# Patient Record
Sex: Male | Born: 1950 | Race: White | Hispanic: No | Smoking: Former smoker
Health system: Southern US, Community
[De-identification: ages and names within clinical notes are randomized; demographics above are authoritative.]

## PROBLEM LIST (undated history)

## (undated) DIAGNOSIS — I82409 Acute embolism and thrombosis of unspecified deep veins of unspecified lower extremity: Secondary | ICD-10-CM

## (undated) DIAGNOSIS — E119 Type 2 diabetes mellitus without complications: Secondary | ICD-10-CM

## (undated) DIAGNOSIS — N200 Calculus of kidney: Secondary | ICD-10-CM

## (undated) DIAGNOSIS — Z87442 Personal history of urinary calculi: Secondary | ICD-10-CM

## (undated) DIAGNOSIS — I10 Essential (primary) hypertension: Secondary | ICD-10-CM

## (undated) DIAGNOSIS — T8859XA Other complications of anesthesia, initial encounter: Secondary | ICD-10-CM

## (undated) DIAGNOSIS — I4891 Unspecified atrial fibrillation: Secondary | ICD-10-CM

## (undated) DIAGNOSIS — G4730 Sleep apnea, unspecified: Secondary | ICD-10-CM

## (undated) DIAGNOSIS — F32A Depression, unspecified: Secondary | ICD-10-CM

## (undated) HISTORY — PX: STOMACH SURGERY: SHX791

---

## 1998-07-29 HISTORY — PX: TONSILLECTOMY: SUR1361

## 2006-03-26 ENCOUNTER — Ambulatory Visit (HOSPITAL_COMMUNITY): Admission: RE | Admit: 2006-03-26 | Discharge: 2006-03-26 | Payer: Self-pay | Admitting: Urology

## 2006-03-27 ENCOUNTER — Ambulatory Visit (HOSPITAL_COMMUNITY): Admission: RE | Admit: 2006-03-27 | Discharge: 2006-03-27 | Payer: Self-pay | Admitting: Urology

## 2006-04-28 ENCOUNTER — Ambulatory Visit (HOSPITAL_COMMUNITY): Admission: RE | Admit: 2006-04-28 | Discharge: 2006-04-28 | Payer: Self-pay | Admitting: Urology

## 2007-06-22 ENCOUNTER — Ambulatory Visit (HOSPITAL_COMMUNITY): Admission: RE | Admit: 2007-06-22 | Discharge: 2007-06-22 | Payer: Self-pay | Admitting: Neurosurgery

## 2007-09-22 ENCOUNTER — Ambulatory Visit: Payer: Self-pay | Admitting: Gastroenterology

## 2007-09-23 ENCOUNTER — Ambulatory Visit (HOSPITAL_COMMUNITY): Admission: RE | Admit: 2007-09-23 | Discharge: 2007-09-23 | Payer: Self-pay | Admitting: Gastroenterology

## 2007-09-23 ENCOUNTER — Encounter: Payer: Self-pay | Admitting: Gastroenterology

## 2007-09-23 ENCOUNTER — Ambulatory Visit: Payer: Self-pay | Admitting: Gastroenterology

## 2007-12-29 ENCOUNTER — Ambulatory Visit: Payer: Self-pay | Admitting: Gastroenterology

## 2008-02-26 DIAGNOSIS — I10 Essential (primary) hypertension: Secondary | ICD-10-CM | POA: Insufficient documentation

## 2008-02-26 DIAGNOSIS — K648 Other hemorrhoids: Secondary | ICD-10-CM | POA: Insufficient documentation

## 2008-02-26 DIAGNOSIS — E785 Hyperlipidemia, unspecified: Secondary | ICD-10-CM | POA: Insufficient documentation

## 2008-02-26 DIAGNOSIS — G4730 Sleep apnea, unspecified: Secondary | ICD-10-CM | POA: Insufficient documentation

## 2008-02-26 DIAGNOSIS — K2980 Duodenitis without bleeding: Secondary | ICD-10-CM | POA: Insufficient documentation

## 2008-02-26 DIAGNOSIS — K2970 Gastritis, unspecified, without bleeding: Secondary | ICD-10-CM | POA: Insufficient documentation

## 2008-02-26 DIAGNOSIS — K2990 Gastroduodenitis, unspecified, without bleeding: Secondary | ICD-10-CM | POA: Insufficient documentation

## 2008-02-26 DIAGNOSIS — R109 Unspecified abdominal pain: Secondary | ICD-10-CM | POA: Insufficient documentation

## 2008-02-26 DIAGNOSIS — G4700 Insomnia, unspecified: Secondary | ICD-10-CM

## 2008-02-26 DIAGNOSIS — M542 Cervicalgia: Secondary | ICD-10-CM

## 2008-02-26 DIAGNOSIS — K644 Residual hemorrhoidal skin tags: Secondary | ICD-10-CM | POA: Insufficient documentation

## 2008-02-26 DIAGNOSIS — K269 Duodenal ulcer, unspecified as acute or chronic, without hemorrhage or perforation: Secondary | ICD-10-CM | POA: Insufficient documentation

## 2008-02-26 DIAGNOSIS — K921 Melena: Secondary | ICD-10-CM

## 2008-02-26 DIAGNOSIS — E119 Type 2 diabetes mellitus without complications: Secondary | ICD-10-CM | POA: Insufficient documentation

## 2008-02-26 DIAGNOSIS — Z8719 Personal history of other diseases of the digestive system: Secondary | ICD-10-CM

## 2008-11-17 ENCOUNTER — Ambulatory Visit (HOSPITAL_COMMUNITY): Admission: RE | Admit: 2008-11-17 | Discharge: 2008-11-17 | Payer: Self-pay | Admitting: Urology

## 2010-05-09 ENCOUNTER — Emergency Department (HOSPITAL_COMMUNITY): Admission: EM | Admit: 2010-05-09 | Discharge: 2010-05-09 | Payer: Self-pay | Admitting: Emergency Medicine

## 2010-10-11 LAB — COMPREHENSIVE METABOLIC PANEL
ALT: 16 U/L (ref 0–53)
AST: 20 U/L (ref 0–37)
Alkaline Phosphatase: 64 U/L (ref 39–117)
CO2: 31 mEq/L (ref 19–32)
Calcium: 9.1 mg/dL (ref 8.4–10.5)
GFR calc Af Amer: >60 mL/min (ref >60)
GFR calc non Af Amer: >60 mL/min (ref >60)
Glucose, Bld: 148 mg/dL — ABNORMAL HIGH (ref 70–99)
Potassium: 4.4 mEq/L (ref 3.5–5.1)
Sodium: 139 mEq/L (ref 135–145)

## 2010-10-11 LAB — DIFFERENTIAL
Basophils Relative: 0 % (ref 0–1)
Eosinophils Absolute: 0.1 10*3/uL (ref 0.0–0.7)
Eosinophils Relative: 1 % (ref 0–5)
Lymphs Abs: 2.2 10*3/uL (ref 0.7–4.0)
Monocytes Relative: 8 % (ref 3–12)
Neutrophils Relative %: 60 % (ref 43–77)

## 2010-10-11 LAB — URINALYSIS, ROUTINE W REFLEX MICROSCOPIC
Bilirubin Urine: NEGATIVE
Glucose, UA: NEGATIVE mg/dL
Ketones, ur: NEGATIVE mg/dL
Protein, ur: NEGATIVE mg/dL
pH: 7 (ref 5.0–8.0)

## 2010-10-11 LAB — URINE CULTURE
Colony Count: NO GROWTH
Culture  Setup Time: 201110130155
Culture: NO GROWTH

## 2010-10-11 LAB — CBC
HCT: 43.5 % (ref 39.0–52.0)
Hemoglobin: 14.8 g/dL (ref 13.0–17.0)
RBC: 4.89 MIL/uL (ref 4.22–5.81)
WBC: 7.1 10*3/uL (ref 4.0–10.5)

## 2010-12-11 NOTE — Assessment & Plan Note (Signed)
NAME:  Jim Ramirez, Jim Ramirez               CHART#:  [id number]   DATE:  12/29/2007                       DOB:  04-16-1951   REFERRING PHYSICIAN:  [name], M.D.   PROBLEM LIST:  1. Gastritis, duodenitis, and duodenal ulcer secondary to aspirin and      Mobic without gastrointestinal prophylaxis.  2. Neck pain.  3. Diabetes.  4. Insomnia.  5. Hyperlipidemia.  6. Sleep apnea.  7. Hypertension.  8. Colonoscopy in 2008 for rectal bleeding, which showed internal and      external hemorrhoids.   SUBJECTIVE:  Jim Ramirez is a 60 year old male who presents as a return  patient visit.  He was off his Mobic and his aspirin for three weeks.  He restarted it and approximately 1-1/2 months after that, had two  episodes of cramping.  He continues only omeprazole daily.  His appetite  is good.  He denies any problems swallowing, diarrhea, or constipation.   ALLERGIES:  No known drug allergies.   MEDICATIONS:  1. Insulin 70/30 40 units daily.  2. Simvastatin.  3. Trandolapril.  4. HCTZ.  5. Metoprolol.  6. Metformin 500 mg t.i.d.  7. Ambien CR 12.5 mg nightly.  8. Meloxicam 15 mg daily.  9. Aspirin 325 mg daily.  10.B12 1000 mcg daily.  11.Omeprazole 20 mg daily.   OBJECTIVE:  Weight 238 pounds.  Height 6 feet.  Temperature 97.8, blood  pressure 130/88, pulse 84.GENERAL:  He is in no apparent distress.  Alert and oriented x4.  LUNGS:  Clear to auscultation bilaterally.  CARDIOVASCULAR:  Regular rhythm.  No murmur.  ABDOMEN:  Bowel sounds are present.  Soft, nontender, nondistended.   ASSESSMENT:  Jim Ramirez is a 60 year old male who presents with  gastritis and duodenal ulcers as well as duodenitis while being on  aspirin and Mobic.  His symptoms have not been ideally controlled.  Thank you for allowing me to see Jim Ramirez in consultation.  My  recommendations follow.   RECOMMENDATIONS:  1. Will change to Nexium 40 mg daily in an attempt to provide more      adequate control of his  symptoms.  He should be on a PPI      indefinitely while taking aspirin and other anti-inflammatory      drugs.  2. Screening colonoscopy in 2018.  3. He may follow up with me as needed.  If he is having periodic      abdominal pain less than once a month, then he is instructed to      just continue the PPI.  If he is having frequent bouts of abdominal      pain, then he should call me, and we can discuss the need to stop      the anti-inflammatory drugs and choose an anti-inflammatory drug in      a different category.       Kassie Mends, M.D.  Electronically Signed     SM/MEDQ  D:  12/29/2007  T:  12/29/2007  Job:  [id number]   cc:   [name], MD

## 2010-12-11 NOTE — H&P (Signed)
NAME:  Jim Ramirez, Jim Ramirez NO.:  [id number]   MEDICAL RECORD NO.:  [id number]          PATIENT TYPE:  AMB   LOCATION:  DAY                           FACILITY:  [facility]   PHYSICIAN:  Kassie Mends, M.D.      DATE OF BIRTH:  07-Jul-1951   DATE OF ADMISSION:  DATE OF DISCHARGE:  LH                              HISTORY & PHYSICAL   REASON FOR CONSULTATION:  Abdominal cramping and black stools.   HISTORY OF PRESENT ILLNESS:  Jim Ramirez is a 60 year old gentleman who  presents with a 3-4 week history of abdominal cramps and black stools.  He states he has had about 4 episodes of severe abdominal cramping which  has doubled him over.  Not precipitated by any particular event.  He has  noted the last few weeks that his stools have been more hard.  He  usually has loose stools.  He notes all his change in bowel movements  began about 3 months ago when he started taking Mobic.  This was given  to him after he had a neck surgery.  He noted that his stools became  very dark.  More recently they have been black.  He saw black stool last  week.  Denies any red blood.  Denies any heartburn, dysphagia,  odynophagia, or weight loss.  He had vomiting once about 3 weeks ago.  Denies any hematemesis.   CURRENT MEDICATIONS:  1. Insulin 70/30, 40 units daily.  2. Simvastatin 20 mg daily.  3. Trandolapril 2 daily.  4. Hydrochlorothiazide 25 mg daily.  5. Metoprolol 50 mg daily.  6. Metformin 500 mg t.i.d.  7. Ambien CR 12.5 mg daily.  8. Mobic 15 mg daily.  9. Aspirin 325 mg daily.  10.B12 1000 mcg daily.   ALLERGIES:  NO KNOWN DRUG ALLERGIES.   PAST MEDICAL HISTORY:  1. Hypercholesterolemia.  2. Diabetes mellitus.  3. Hypertension.  4. Sleep apnea.   Colonoscopy, July 14, 2007 for rectal bleeding by Dr. Erskine Speed  showed internal and external hemorrhoids, otherwise negative.  He also  has had nephrolithiasis status post lithotripsy at least 10 times.  He  had antireflux  surgery in 1997 by Dr. Erskine Speed.  Surgery on his foot  for bone spurs.  He has had neck surgery twice and sinus surgery twice.   FAMILY HISTORY:  Negative for colorectal cancer, chronic GI illnesses,  peptic ulcer disease.  He tolerated heart disease, diabetes,  hypertension.  Mother deceased age 77 of brain damage from a fall.   SOCIAL HISTORY:  He is single.  He has 2 children.  He is unemployed.  He quit smoking 15 years ago.  No alcohol use.   REVIEW OF SYSTEMS:  See HPI for GI.  CONSTITUTIONAL:  No weight loss.  CARDIOPULMONARY:  No chest pain or shortness of breath.  GENITOURINARY:  No dysuria or hematuria.   PHYSICAL EXAMINATION:  VITAL SIGNS:  Weight 240.  Height 6 feet.  Temp  97.8.  Blood pressure 130/80.  Pulse 80.  GENERAL:  A pleasant well-developed, well-nourished Caucasian male in no  acute distress.  SKIN:  Warm and dry, no jaundice.  HEENT:  Sclerae nonicteric.  Oropharyngeal mucosa moist and pink.  No  lesions, erythema, or exudate.  No lymphadenopathy, thyromegaly.  CHEST:  Lungs are clear to auscultation.  CARDIAC:  Exam reveals regular rate and rhythm.  Normal S1 and S2.  No  murmurs, rubs, or gallops.  ABDOMEN:  Positive bowel sounds.  Abdomen soft.  He has mild epigastric  tenderness to deep palpation.  No rebound, no guarding, no abdominal  bruits or hernias.  No hepatosplenomegaly or masses.  LOWER EXTREMITIES:  No edema.   IMPRESSION:  Jim Ramirez is a 60 year old gentleman with complaints of  abdominal cramping, epigastric pain and melena.  This was in a setting  of aspirin and Mobic use.  Differential diagnosis includes the  possibility of peptic ulcer disease related to nonsteroidal anti-  inflammatory drug use.   PLAN:  Esophagogastroduodenoscopy with Dr. Cira Servant in the near future.  I  believe he is scheduled for tomorrow.  Will hold his Mobic for now.  Further recommendations to follow.      Tana Coast, P.A.      Kassie Mends, M.D.   Electronically Signed    LL/MEDQ  D:  09/22/2007  T:  09/22/2007  Job:  [id number]   cc:   [name], MD  Fax: [phone]

## 2010-12-11 NOTE — Op Note (Signed)
NAME:  Jim Ramirez, Jim Ramirez NO.:  [id number]   MEDICAL RECORD NO.:  [id number]          PATIENT TYPE:  AMB   LOCATION:  DAY                           FACILITY:  [facility]   PHYSICIAN:  Kassie Mends, M.D.      DATE OF BIRTH:  1951/06/20   DATE OF PROCEDURE:  09/23/2007  DATE OF DISCHARGE:                               OPERATIVE REPORT   REFERRING PHYSICIAN:  [name], MD.   PROCEDURE:  Esophagogastroduodenoscopy with cold forceps biopsy.   ENDOSCOPIST:  Kassie Mends, MD.   INDICATIONS FOR EXAM:  Jim Ramirez is a 60 year old male, who presents  with melena while taking aspirin and Mobic without GI prophylaxis.   FINDINGS:  1. Normal esophagus without evidence of Barrett's, mass, erosion,      ulceration, or stricture.  2. Superficial antral erosions associated with diffuse erythema of the      antrum.  Biopsies obtained via cold forceps to evaluate for      H.pylori gastritis.  3. Multiple duodenal ulcers seen at the junction of D-1 and D-2 with      edema and narrowing, which allowed the diagnostic gastroscope to      pass with mild resistance.  No old blood or fresh blood seen in the      stomach or the duodenum.   DIAGNOSIS:  The most likely source for Jim Ramirez melena are the  duodenal ulcers.   RECOMMENDATIONS:  1. Follow up appointment with Dr. Cira Servant in three months to re-evaluate      his melena.  2. He should stop his aspirin and his Mobic and may restart it on      October 15, 2007.  3. He should begin Omeprazole 20 mg daily.  4. We will call Jim Ramirez with the results of his biopsy.  5. He may resume his previous diet but avoid gastric irritants.  He is      given a handout on gastric irritants, ulcers, and gastritis.   MEDICATIONS:  1. Demerol 100 mg IV.  2. Versed 8 mg IV.   PROCEDURE TECHNIQUE:  Physical exam was performed, informed consent was  obtained from the patient after explaining the benefits, risks, and  alternatives to the procedure.   The patient was connected to the monitor  and placed in the left lateral position.  Continuous oxygen was provided  by nasal cannula, and IV medicine administered through an indwelling  cannula.  After administration of sedation, the patient's esophagus was  intubated and the scope was advanced under direct visualization to the  second portion of the duodenum.  The scope was removed  slowly back after examining the color, texture, anatomy, and integrity  of the mucosa on the way out.  The patient was recovered in Endoscopy  and discharged home in satisfactory condition.   PATH 28413:  REACTIVE GASTROPATHY. Continue omeprazole.      Kassie Mends, M.D.  Electronically Signed     SM/MEDQ  D:  09/23/2007  T:  09/23/2007  Job:  [id number]   cc:   [name], MD  Fax: [phone]

## 2010-12-14 NOTE — H&P (Signed)
NAME:  Jim Ramirez, Jim Ramirez NO.:  [id number]   MEDICAL RECORD NO.:  [id number]           PATIENT TYPE:  AMB   LOCATION:  DAY                           FACILITY:  [facility]   PHYSICIAN:  Dennie Maizes, M.D.   DATE OF BIRTH:  01-Dec-1950   DATE OF ADMISSION:  DATE OF DISCHARGE:  LH                                HISTORY & PHYSICAL   CHIEF COMPLAINT:  Left flank pain, left renal calculus.   HISTORY OF PRESENT ILLNESS:  This 60 year old male has history of recurrent  renal lithiasis in the past.  Has undergone several lithotripsies for kidney  stones.  He has been having severe left flank pain radiating to the front  for several weeks.  The pain became severe and he went to the emergency  room.  He was evaluated with a noncontrast CT scan of abdomen and pelvis.  This revealed a 7 x 7 x 3 mm size left renal calculus.  There was no  evidence of hydronephrosis.  The patient is unable to pass the stone.  He is  brought to the [facility] today for extracorporeal shockwave  lithotripsy of the left renal calculus.  The patient denies having any  fever, chills, voiding difficulty, or gross hematuria at present.   PAST MEDICAL HISTORY:  History of hypertension, elevated cholesterol,  depression, status post bilateral vasectomy, status post circumcision,  status post Nissen fundoplication.   MEDICATIONS:  1. Insulin 70/30 50 units subcutaneous daily.  2. Toprol XL 50 mg one p.o. daily.  3. Mavik 2 mg one p.o. daily.  4. Zocor 20 mg one p.o. daily.  5. Metformin 500 mg two p.o. daily.  6. Ambien CR 12.5 mg one p.o. daily.  7. Seroquel 50 mg one p.o. daily.  8. Trileptal 300 mg one p.o. daily.  9. Zoloft 100 mg twice a day.   ALLERGIES:  No known drug allergies.   PHYSICAL EXAMINATION:  HEENT:  Normal.  NECK:  No masses.  LUNGS:  Clear to auscultation.  HEART:  Regular rate and rhythm with no murmurs.  ABDOMEN:  Soft, no palpable flank mass, mild left costovertebral  angle  tenderness is noted.  Bladder not palpable.  Penis and testes are normal.   IMPRESSION:  1. Left renal calculus.  2. Left flank pain.   PLAN:  ESL of left renal calculus with IV sedation in [facility].  I  have discussed with the patient regarding the diagnosis, operative  procedure, alternative treatments, all risks and complications, and he has  agreed for the procedure to be done.      Dennie Maizes, M.D.  Electronically Signed     SK/MEDQ  D:  03/25/2006  T:  03/25/2006  Job:  [id number]

## 2016-09-17 DIAGNOSIS — Z713 Dietary counseling and surveillance: Secondary | ICD-10-CM | POA: Diagnosis not present

## 2016-09-17 DIAGNOSIS — M542 Cervicalgia: Secondary | ICD-10-CM | POA: Diagnosis not present

## 2016-09-17 DIAGNOSIS — E279 Disorder of adrenal gland, unspecified: Secondary | ICD-10-CM | POA: Diagnosis not present

## 2016-09-17 DIAGNOSIS — E1142 Type 2 diabetes mellitus with diabetic polyneuropathy: Secondary | ICD-10-CM | POA: Diagnosis not present

## 2016-09-17 DIAGNOSIS — Z87891 Personal history of nicotine dependence: Secondary | ICD-10-CM | POA: Diagnosis not present

## 2016-09-17 DIAGNOSIS — Z6829 Body mass index (BMI) 29.0-29.9, adult: Secondary | ICD-10-CM | POA: Diagnosis not present

## 2016-09-17 DIAGNOSIS — Z299 Encounter for prophylactic measures, unspecified: Secondary | ICD-10-CM | POA: Diagnosis not present

## 2016-09-17 DIAGNOSIS — I10 Essential (primary) hypertension: Secondary | ICD-10-CM | POA: Diagnosis not present

## 2016-10-08 DIAGNOSIS — I10 Essential (primary) hypertension: Secondary | ICD-10-CM | POA: Diagnosis not present

## 2016-10-08 DIAGNOSIS — E1142 Type 2 diabetes mellitus with diabetic polyneuropathy: Secondary | ICD-10-CM | POA: Diagnosis not present

## 2016-10-08 DIAGNOSIS — Z713 Dietary counseling and surveillance: Secondary | ICD-10-CM | POA: Diagnosis not present

## 2016-10-08 DIAGNOSIS — E7800 Pure hypercholesterolemia, unspecified: Secondary | ICD-10-CM | POA: Diagnosis not present

## 2016-10-08 DIAGNOSIS — R69 Illness, unspecified: Secondary | ICD-10-CM | POA: Diagnosis not present

## 2016-10-08 DIAGNOSIS — Z6829 Body mass index (BMI) 29.0-29.9, adult: Secondary | ICD-10-CM | POA: Diagnosis not present

## 2016-10-08 DIAGNOSIS — Z299 Encounter for prophylactic measures, unspecified: Secondary | ICD-10-CM | POA: Diagnosis not present

## 2016-10-10 DIAGNOSIS — R69 Illness, unspecified: Secondary | ICD-10-CM | POA: Diagnosis not present

## 2016-10-23 DIAGNOSIS — E1140 Type 2 diabetes mellitus with diabetic neuropathy, unspecified: Secondary | ICD-10-CM | POA: Diagnosis not present

## 2016-10-23 DIAGNOSIS — E109 Type 1 diabetes mellitus without complications: Secondary | ICD-10-CM | POA: Diagnosis not present

## 2016-10-23 DIAGNOSIS — L84 Corns and callosities: Secondary | ICD-10-CM | POA: Diagnosis not present

## 2016-11-14 DIAGNOSIS — E1142 Type 2 diabetes mellitus with diabetic polyneuropathy: Secondary | ICD-10-CM | POA: Diagnosis not present

## 2016-11-14 DIAGNOSIS — K219 Gastro-esophageal reflux disease without esophagitis: Secondary | ICD-10-CM | POA: Diagnosis not present

## 2016-11-14 DIAGNOSIS — Z299 Encounter for prophylactic measures, unspecified: Secondary | ICD-10-CM | POA: Diagnosis not present

## 2016-11-14 DIAGNOSIS — Z6829 Body mass index (BMI) 29.0-29.9, adult: Secondary | ICD-10-CM | POA: Diagnosis not present

## 2016-11-14 DIAGNOSIS — I10 Essential (primary) hypertension: Secondary | ICD-10-CM | POA: Diagnosis not present

## 2016-11-14 DIAGNOSIS — E7800 Pure hypercholesterolemia, unspecified: Secondary | ICD-10-CM | POA: Diagnosis not present

## 2016-11-26 ENCOUNTER — Telehealth: Payer: Self-pay | Admitting: Gastroenterology

## 2016-11-26 NOTE — Telephone Encounter (Signed)
Recall for tcs °

## 2016-11-26 NOTE — Telephone Encounter (Signed)
Letter mailed

## 2016-11-29 ENCOUNTER — Telehealth: Payer: Self-pay | Admitting: Gastroenterology

## 2016-11-29 NOTE — Telephone Encounter (Signed)
Pt said he received a letter from us that it was time for his colonoscopy. He isn't having any GI problems, no blood thinners or hx of heart attack. He wanted to know when his last colonoscopy was and I couldn't find it in epic. [facility] from [facility]. Please call patient [phone]

## 2016-12-02 NOTE — Telephone Encounter (Signed)
LMOM for a return call.  

## 2016-12-10 NOTE — Telephone Encounter (Signed)
Last colonoscopy from [facility] was done 07/14/2007 by Dr. Gabriel CirrieMason. Impression:  1. Internal and external hemorrhoids. 2. No active bleeding noted. 3. Normal colonoscopy otherwise.   Looks like pt not due til 06/2017 unless problems or family hx of colon cancer.

## 2016-12-10 NOTE — Telephone Encounter (Signed)
Tried to call pt. Was told this was not his number. Mailing a letter for pt to call.

## 2017-01-09 DIAGNOSIS — R69 Illness, unspecified: Secondary | ICD-10-CM | POA: Diagnosis not present

## 2017-01-13 DIAGNOSIS — E1142 Type 2 diabetes mellitus with diabetic polyneuropathy: Secondary | ICD-10-CM | POA: Diagnosis not present

## 2017-01-13 DIAGNOSIS — Z6829 Body mass index (BMI) 29.0-29.9, adult: Secondary | ICD-10-CM | POA: Diagnosis not present

## 2017-01-13 DIAGNOSIS — Z299 Encounter for prophylactic measures, unspecified: Secondary | ICD-10-CM | POA: Diagnosis not present

## 2017-01-13 DIAGNOSIS — K219 Gastro-esophageal reflux disease without esophagitis: Secondary | ICD-10-CM | POA: Diagnosis not present

## 2017-01-13 DIAGNOSIS — Z1389 Encounter for screening for other disorder: Secondary | ICD-10-CM | POA: Diagnosis not present

## 2017-01-13 DIAGNOSIS — R69 Illness, unspecified: Secondary | ICD-10-CM | POA: Diagnosis not present

## 2017-01-13 DIAGNOSIS — E1165 Type 2 diabetes mellitus with hyperglycemia: Secondary | ICD-10-CM | POA: Diagnosis not present

## 2017-01-13 DIAGNOSIS — E7800 Pure hypercholesterolemia, unspecified: Secondary | ICD-10-CM | POA: Diagnosis not present

## 2017-01-13 DIAGNOSIS — E669 Obesity, unspecified: Secondary | ICD-10-CM | POA: Diagnosis not present

## 2017-01-13 DIAGNOSIS — E279 Disorder of adrenal gland, unspecified: Secondary | ICD-10-CM | POA: Diagnosis not present

## 2017-02-26 DIAGNOSIS — I671 Cerebral aneurysm, nonruptured: Secondary | ICD-10-CM

## 2017-02-26 DIAGNOSIS — I609 Nontraumatic subarachnoid hemorrhage, unspecified: Secondary | ICD-10-CM

## 2017-02-26 HISTORY — DX: Nontraumatic subarachnoid hemorrhage, unspecified: I60.9

## 2017-02-26 HISTORY — DX: Cerebral aneurysm, nonruptured: I67.1

## 2017-03-17 DIAGNOSIS — E7800 Pure hypercholesterolemia, unspecified: Secondary | ICD-10-CM | POA: Diagnosis not present

## 2017-03-17 DIAGNOSIS — Z299 Encounter for prophylactic measures, unspecified: Secondary | ICD-10-CM | POA: Diagnosis not present

## 2017-03-17 DIAGNOSIS — Z79899 Other long term (current) drug therapy: Secondary | ICD-10-CM | POA: Diagnosis not present

## 2017-03-17 DIAGNOSIS — G8929 Other chronic pain: Secondary | ICD-10-CM | POA: Diagnosis not present

## 2017-03-17 DIAGNOSIS — E1165 Type 2 diabetes mellitus with hyperglycemia: Secondary | ICD-10-CM | POA: Diagnosis not present

## 2017-03-17 DIAGNOSIS — B079 Viral wart, unspecified: Secondary | ICD-10-CM | POA: Diagnosis not present

## 2017-03-17 DIAGNOSIS — E1142 Type 2 diabetes mellitus with diabetic polyneuropathy: Secondary | ICD-10-CM | POA: Diagnosis not present

## 2017-03-17 DIAGNOSIS — M545 Low back pain: Secondary | ICD-10-CM | POA: Diagnosis not present

## 2017-03-17 DIAGNOSIS — G4700 Insomnia, unspecified: Secondary | ICD-10-CM | POA: Diagnosis not present

## 2017-03-17 DIAGNOSIS — Z6829 Body mass index (BMI) 29.0-29.9, adult: Secondary | ICD-10-CM | POA: Diagnosis not present

## 2017-03-24 DIAGNOSIS — E1165 Type 2 diabetes mellitus with hyperglycemia: Secondary | ICD-10-CM | POA: Diagnosis not present

## 2017-03-24 DIAGNOSIS — Z299 Encounter for prophylactic measures, unspecified: Secondary | ICD-10-CM | POA: Diagnosis not present

## 2017-03-24 DIAGNOSIS — E7800 Pure hypercholesterolemia, unspecified: Secondary | ICD-10-CM | POA: Diagnosis not present

## 2017-03-24 DIAGNOSIS — Z713 Dietary counseling and surveillance: Secondary | ICD-10-CM | POA: Diagnosis not present

## 2017-03-24 DIAGNOSIS — Z6829 Body mass index (BMI) 29.0-29.9, adult: Secondary | ICD-10-CM | POA: Diagnosis not present

## 2017-03-24 DIAGNOSIS — K219 Gastro-esophageal reflux disease without esophagitis: Secondary | ICD-10-CM | POA: Diagnosis not present

## 2017-03-24 DIAGNOSIS — R51 Headache: Secondary | ICD-10-CM | POA: Diagnosis not present

## 2017-03-24 DIAGNOSIS — I10 Essential (primary) hypertension: Secondary | ICD-10-CM | POA: Diagnosis not present

## 2017-03-24 DIAGNOSIS — E1142 Type 2 diabetes mellitus with diabetic polyneuropathy: Secondary | ICD-10-CM | POA: Diagnosis not present

## 2017-03-25 DIAGNOSIS — E785 Hyperlipidemia, unspecified: Secondary | ICD-10-CM | POA: Diagnosis not present

## 2017-03-25 DIAGNOSIS — Z7982 Long term (current) use of aspirin: Secondary | ICD-10-CM | POA: Diagnosis not present

## 2017-03-25 DIAGNOSIS — M542 Cervicalgia: Secondary | ICD-10-CM | POA: Diagnosis not present

## 2017-03-25 DIAGNOSIS — S066X9A Traumatic subarachnoid hemorrhage with loss of consciousness of unspecified duration, initial encounter: Secondary | ICD-10-CM | POA: Diagnosis not present

## 2017-03-25 DIAGNOSIS — E119 Type 2 diabetes mellitus without complications: Secondary | ICD-10-CM | POA: Diagnosis not present

## 2017-03-25 DIAGNOSIS — Z79899 Other long term (current) drug therapy: Secondary | ICD-10-CM | POA: Diagnosis not present

## 2017-03-25 DIAGNOSIS — R51 Headache: Secondary | ICD-10-CM | POA: Diagnosis not present

## 2017-03-25 DIAGNOSIS — R930 Abnormal findings on diagnostic imaging of skull and head, not elsewhere classified: Secondary | ICD-10-CM | POA: Diagnosis not present

## 2017-03-25 DIAGNOSIS — Z87891 Personal history of nicotine dependence: Secondary | ICD-10-CM | POA: Diagnosis not present

## 2017-03-25 DIAGNOSIS — I4510 Unspecified right bundle-branch block: Secondary | ICD-10-CM | POA: Diagnosis not present

## 2017-03-25 DIAGNOSIS — Z981 Arthrodesis status: Secondary | ICD-10-CM | POA: Diagnosis not present

## 2017-03-25 DIAGNOSIS — K219 Gastro-esophageal reflux disease without esophagitis: Secondary | ICD-10-CM | POA: Diagnosis not present

## 2017-03-25 DIAGNOSIS — Z794 Long term (current) use of insulin: Secondary | ICD-10-CM | POA: Diagnosis not present

## 2017-03-25 DIAGNOSIS — I609 Nontraumatic subarachnoid hemorrhage, unspecified: Secondary | ICD-10-CM | POA: Diagnosis not present

## 2017-03-25 DIAGNOSIS — I10 Essential (primary) hypertension: Secondary | ICD-10-CM | POA: Diagnosis not present

## 2017-03-25 DIAGNOSIS — I67848 Other cerebrovascular vasospasm and vasoconstriction: Secondary | ICD-10-CM | POA: Diagnosis not present

## 2017-03-30 DIAGNOSIS — I4510 Unspecified right bundle-branch block: Secondary | ICD-10-CM | POA: Diagnosis not present

## 2017-04-07 DIAGNOSIS — I608 Other nontraumatic subarachnoid hemorrhage: Secondary | ICD-10-CM | POA: Diagnosis not present

## 2017-04-07 DIAGNOSIS — I609 Nontraumatic subarachnoid hemorrhage, unspecified: Secondary | ICD-10-CM | POA: Diagnosis not present

## 2017-04-07 DIAGNOSIS — E871 Hypo-osmolality and hyponatremia: Secondary | ICD-10-CM | POA: Diagnosis not present

## 2017-04-07 DIAGNOSIS — I10 Essential (primary) hypertension: Secondary | ICD-10-CM | POA: Diagnosis not present

## 2017-04-07 DIAGNOSIS — Z87891 Personal history of nicotine dependence: Secondary | ICD-10-CM | POA: Diagnosis not present

## 2017-04-08 DIAGNOSIS — Z6829 Body mass index (BMI) 29.0-29.9, adult: Secondary | ICD-10-CM | POA: Diagnosis not present

## 2017-04-08 DIAGNOSIS — G8929 Other chronic pain: Secondary | ICD-10-CM | POA: Diagnosis not present

## 2017-04-08 DIAGNOSIS — E1165 Type 2 diabetes mellitus with hyperglycemia: Secondary | ICD-10-CM | POA: Diagnosis not present

## 2017-04-08 DIAGNOSIS — M545 Low back pain: Secondary | ICD-10-CM | POA: Diagnosis not present

## 2017-04-08 DIAGNOSIS — Z299 Encounter for prophylactic measures, unspecified: Secondary | ICD-10-CM | POA: Diagnosis not present

## 2017-04-08 DIAGNOSIS — E279 Disorder of adrenal gland, unspecified: Secondary | ICD-10-CM | POA: Diagnosis not present

## 2017-04-08 DIAGNOSIS — E669 Obesity, unspecified: Secondary | ICD-10-CM | POA: Diagnosis not present

## 2017-04-08 DIAGNOSIS — E871 Hypo-osmolality and hyponatremia: Secondary | ICD-10-CM | POA: Diagnosis not present

## 2017-04-08 DIAGNOSIS — E1142 Type 2 diabetes mellitus with diabetic polyneuropathy: Secondary | ICD-10-CM | POA: Diagnosis not present

## 2017-04-08 DIAGNOSIS — S066X9A Traumatic subarachnoid hemorrhage with loss of consciousness of unspecified duration, initial encounter: Secondary | ICD-10-CM | POA: Diagnosis not present

## 2017-04-08 DIAGNOSIS — R69 Illness, unspecified: Secondary | ICD-10-CM | POA: Diagnosis not present

## 2017-04-11 DIAGNOSIS — E871 Hypo-osmolality and hyponatremia: Secondary | ICD-10-CM | POA: Diagnosis not present

## 2017-04-15 DIAGNOSIS — R69 Illness, unspecified: Secondary | ICD-10-CM | POA: Diagnosis not present

## 2017-04-16 DIAGNOSIS — R69 Illness, unspecified: Secondary | ICD-10-CM | POA: Diagnosis not present

## 2017-04-18 DIAGNOSIS — E871 Hypo-osmolality and hyponatremia: Secondary | ICD-10-CM | POA: Diagnosis not present

## 2017-04-28 DIAGNOSIS — R69 Illness, unspecified: Secondary | ICD-10-CM | POA: Diagnosis not present

## 2017-05-15 DIAGNOSIS — E279 Disorder of adrenal gland, unspecified: Secondary | ICD-10-CM | POA: Diagnosis not present

## 2017-05-15 DIAGNOSIS — S066X9A Traumatic subarachnoid hemorrhage with loss of consciousness of unspecified duration, initial encounter: Secondary | ICD-10-CM | POA: Diagnosis not present

## 2017-05-15 DIAGNOSIS — I10 Essential (primary) hypertension: Secondary | ICD-10-CM | POA: Diagnosis not present

## 2017-05-15 DIAGNOSIS — E1142 Type 2 diabetes mellitus with diabetic polyneuropathy: Secondary | ICD-10-CM | POA: Diagnosis not present

## 2017-05-15 DIAGNOSIS — Z6829 Body mass index (BMI) 29.0-29.9, adult: Secondary | ICD-10-CM | POA: Diagnosis not present

## 2017-05-15 DIAGNOSIS — E1165 Type 2 diabetes mellitus with hyperglycemia: Secondary | ICD-10-CM | POA: Diagnosis not present

## 2017-05-15 DIAGNOSIS — Z299 Encounter for prophylactic measures, unspecified: Secondary | ICD-10-CM | POA: Diagnosis not present

## 2017-07-17 DIAGNOSIS — R5383 Other fatigue: Secondary | ICD-10-CM | POA: Diagnosis not present

## 2017-07-17 DIAGNOSIS — Z0000 Encounter for general adult medical examination without abnormal findings: Secondary | ICD-10-CM | POA: Diagnosis not present

## 2017-07-17 DIAGNOSIS — Z7189 Other specified counseling: Secondary | ICD-10-CM | POA: Diagnosis not present

## 2017-07-17 DIAGNOSIS — Z1211 Encounter for screening for malignant neoplasm of colon: Secondary | ICD-10-CM | POA: Diagnosis not present

## 2017-07-17 DIAGNOSIS — E7800 Pure hypercholesterolemia, unspecified: Secondary | ICD-10-CM | POA: Diagnosis not present

## 2017-07-17 DIAGNOSIS — Z125 Encounter for screening for malignant neoplasm of prostate: Secondary | ICD-10-CM | POA: Diagnosis not present

## 2017-07-17 DIAGNOSIS — Z6830 Body mass index (BMI) 30.0-30.9, adult: Secondary | ICD-10-CM | POA: Diagnosis not present

## 2017-07-17 DIAGNOSIS — Z1331 Encounter for screening for depression: Secondary | ICD-10-CM | POA: Diagnosis not present

## 2017-07-17 DIAGNOSIS — Z299 Encounter for prophylactic measures, unspecified: Secondary | ICD-10-CM | POA: Diagnosis not present

## 2017-07-17 DIAGNOSIS — Z1339 Encounter for screening examination for other mental health and behavioral disorders: Secondary | ICD-10-CM | POA: Diagnosis not present

## 2017-07-18 DIAGNOSIS — Z0000 Encounter for general adult medical examination without abnormal findings: Secondary | ICD-10-CM | POA: Diagnosis not present

## 2017-07-18 DIAGNOSIS — Z79899 Other long term (current) drug therapy: Secondary | ICD-10-CM | POA: Diagnosis not present

## 2017-07-18 DIAGNOSIS — Z125 Encounter for screening for malignant neoplasm of prostate: Secondary | ICD-10-CM | POA: Diagnosis not present

## 2017-07-18 DIAGNOSIS — R5383 Other fatigue: Secondary | ICD-10-CM | POA: Diagnosis not present

## 2017-08-01 DIAGNOSIS — Z299 Encounter for prophylactic measures, unspecified: Secondary | ICD-10-CM | POA: Diagnosis not present

## 2017-08-01 DIAGNOSIS — E1142 Type 2 diabetes mellitus with diabetic polyneuropathy: Secondary | ICD-10-CM | POA: Diagnosis not present

## 2017-08-01 DIAGNOSIS — E1165 Type 2 diabetes mellitus with hyperglycemia: Secondary | ICD-10-CM | POA: Diagnosis not present

## 2017-08-01 DIAGNOSIS — Z87891 Personal history of nicotine dependence: Secondary | ICD-10-CM | POA: Diagnosis not present

## 2017-08-01 DIAGNOSIS — Z6829 Body mass index (BMI) 29.0-29.9, adult: Secondary | ICD-10-CM | POA: Diagnosis not present

## 2017-08-01 DIAGNOSIS — E279 Disorder of adrenal gland, unspecified: Secondary | ICD-10-CM | POA: Diagnosis not present

## 2017-08-01 DIAGNOSIS — R69 Illness, unspecified: Secondary | ICD-10-CM | POA: Diagnosis not present

## 2017-08-01 DIAGNOSIS — I10 Essential (primary) hypertension: Secondary | ICD-10-CM | POA: Diagnosis not present

## 2017-08-01 DIAGNOSIS — J069 Acute upper respiratory infection, unspecified: Secondary | ICD-10-CM | POA: Diagnosis not present

## 2017-08-06 DIAGNOSIS — R05 Cough: Secondary | ICD-10-CM | POA: Diagnosis not present

## 2017-08-06 DIAGNOSIS — I10 Essential (primary) hypertension: Secondary | ICD-10-CM | POA: Diagnosis not present

## 2017-08-06 DIAGNOSIS — S066X9A Traumatic subarachnoid hemorrhage with loss of consciousness of unspecified duration, initial encounter: Secondary | ICD-10-CM | POA: Diagnosis not present

## 2017-08-06 DIAGNOSIS — Z6830 Body mass index (BMI) 30.0-30.9, adult: Secondary | ICD-10-CM | POA: Diagnosis not present

## 2017-08-06 DIAGNOSIS — Z299 Encounter for prophylactic measures, unspecified: Secondary | ICD-10-CM | POA: Diagnosis not present

## 2017-09-01 DIAGNOSIS — R69 Illness, unspecified: Secondary | ICD-10-CM | POA: Diagnosis not present

## 2017-09-02 DIAGNOSIS — R69 Illness, unspecified: Secondary | ICD-10-CM | POA: Diagnosis not present

## 2017-10-15 DIAGNOSIS — Z299 Encounter for prophylactic measures, unspecified: Secondary | ICD-10-CM | POA: Diagnosis not present

## 2017-10-15 DIAGNOSIS — E1142 Type 2 diabetes mellitus with diabetic polyneuropathy: Secondary | ICD-10-CM | POA: Diagnosis not present

## 2017-10-15 DIAGNOSIS — E279 Disorder of adrenal gland, unspecified: Secondary | ICD-10-CM | POA: Diagnosis not present

## 2017-10-15 DIAGNOSIS — Z6830 Body mass index (BMI) 30.0-30.9, adult: Secondary | ICD-10-CM | POA: Diagnosis not present

## 2017-10-15 DIAGNOSIS — G4700 Insomnia, unspecified: Secondary | ICD-10-CM | POA: Diagnosis not present

## 2017-10-15 DIAGNOSIS — I10 Essential (primary) hypertension: Secondary | ICD-10-CM | POA: Diagnosis not present

## 2017-10-15 DIAGNOSIS — G8929 Other chronic pain: Secondary | ICD-10-CM | POA: Diagnosis not present

## 2017-10-15 DIAGNOSIS — E669 Obesity, unspecified: Secondary | ICD-10-CM | POA: Diagnosis not present

## 2017-10-15 DIAGNOSIS — E1165 Type 2 diabetes mellitus with hyperglycemia: Secondary | ICD-10-CM | POA: Diagnosis not present

## 2017-10-15 DIAGNOSIS — M545 Low back pain: Secondary | ICD-10-CM | POA: Diagnosis not present

## 2017-10-16 DIAGNOSIS — Z299 Encounter for prophylactic measures, unspecified: Secondary | ICD-10-CM | POA: Diagnosis not present

## 2017-10-16 DIAGNOSIS — I10 Essential (primary) hypertension: Secondary | ICD-10-CM | POA: Diagnosis not present

## 2017-10-16 DIAGNOSIS — Z6830 Body mass index (BMI) 30.0-30.9, adult: Secondary | ICD-10-CM | POA: Diagnosis not present

## 2017-10-16 DIAGNOSIS — M1710 Unilateral primary osteoarthritis, unspecified knee: Secondary | ICD-10-CM | POA: Diagnosis not present

## 2017-10-20 DIAGNOSIS — R69 Illness, unspecified: Secondary | ICD-10-CM | POA: Diagnosis not present

## 2017-10-21 DIAGNOSIS — R69 Illness, unspecified: Secondary | ICD-10-CM | POA: Diagnosis not present

## 2017-11-03 DIAGNOSIS — E109 Type 1 diabetes mellitus without complications: Secondary | ICD-10-CM | POA: Diagnosis not present

## 2017-11-03 DIAGNOSIS — E1140 Type 2 diabetes mellitus with diabetic neuropathy, unspecified: Secondary | ICD-10-CM | POA: Diagnosis not present

## 2017-11-03 DIAGNOSIS — L84 Corns and callosities: Secondary | ICD-10-CM | POA: Diagnosis not present

## 2017-11-27 DIAGNOSIS — R69 Illness, unspecified: Secondary | ICD-10-CM | POA: Diagnosis not present

## 2017-11-28 DIAGNOSIS — R69 Illness, unspecified: Secondary | ICD-10-CM | POA: Diagnosis not present

## 2017-12-02 DIAGNOSIS — Z299 Encounter for prophylactic measures, unspecified: Secondary | ICD-10-CM | POA: Diagnosis not present

## 2017-12-02 DIAGNOSIS — M545 Low back pain: Secondary | ICD-10-CM | POA: Diagnosis not present

## 2017-12-02 DIAGNOSIS — J302 Other seasonal allergic rhinitis: Secondary | ICD-10-CM | POA: Diagnosis not present

## 2017-12-02 DIAGNOSIS — E1165 Type 2 diabetes mellitus with hyperglycemia: Secondary | ICD-10-CM | POA: Diagnosis not present

## 2017-12-02 DIAGNOSIS — G8929 Other chronic pain: Secondary | ICD-10-CM | POA: Diagnosis not present

## 2017-12-02 DIAGNOSIS — E279 Disorder of adrenal gland, unspecified: Secondary | ICD-10-CM | POA: Diagnosis not present

## 2017-12-02 DIAGNOSIS — E7800 Pure hypercholesterolemia, unspecified: Secondary | ICD-10-CM | POA: Diagnosis not present

## 2017-12-02 DIAGNOSIS — Z6829 Body mass index (BMI) 29.0-29.9, adult: Secondary | ICD-10-CM | POA: Diagnosis not present

## 2017-12-02 DIAGNOSIS — I10 Essential (primary) hypertension: Secondary | ICD-10-CM | POA: Diagnosis not present

## 2017-12-02 DIAGNOSIS — E1142 Type 2 diabetes mellitus with diabetic polyneuropathy: Secondary | ICD-10-CM | POA: Diagnosis not present

## 2018-01-20 DIAGNOSIS — G8929 Other chronic pain: Secondary | ICD-10-CM | POA: Diagnosis not present

## 2018-01-20 DIAGNOSIS — Z6829 Body mass index (BMI) 29.0-29.9, adult: Secondary | ICD-10-CM | POA: Diagnosis not present

## 2018-01-20 DIAGNOSIS — I10 Essential (primary) hypertension: Secondary | ICD-10-CM | POA: Diagnosis not present

## 2018-01-20 DIAGNOSIS — Z299 Encounter for prophylactic measures, unspecified: Secondary | ICD-10-CM | POA: Diagnosis not present

## 2018-01-20 DIAGNOSIS — M545 Low back pain: Secondary | ICD-10-CM | POA: Diagnosis not present

## 2018-01-20 DIAGNOSIS — E1165 Type 2 diabetes mellitus with hyperglycemia: Secondary | ICD-10-CM | POA: Diagnosis not present

## 2018-01-20 DIAGNOSIS — H9202 Otalgia, left ear: Secondary | ICD-10-CM | POA: Diagnosis not present

## 2018-02-24 DIAGNOSIS — R69 Illness, unspecified: Secondary | ICD-10-CM | POA: Diagnosis not present

## 2018-02-26 DIAGNOSIS — R69 Illness, unspecified: Secondary | ICD-10-CM | POA: Diagnosis not present

## 2018-02-27 DIAGNOSIS — R69 Illness, unspecified: Secondary | ICD-10-CM | POA: Diagnosis not present

## 2018-03-20 DIAGNOSIS — Z299 Encounter for prophylactic measures, unspecified: Secondary | ICD-10-CM | POA: Diagnosis not present

## 2018-03-20 DIAGNOSIS — R51 Headache: Secondary | ICD-10-CM | POA: Diagnosis not present

## 2018-03-20 DIAGNOSIS — Z6829 Body mass index (BMI) 29.0-29.9, adult: Secondary | ICD-10-CM | POA: Diagnosis not present

## 2018-03-20 DIAGNOSIS — M1712 Unilateral primary osteoarthritis, left knee: Secondary | ICD-10-CM | POA: Diagnosis not present

## 2018-03-20 DIAGNOSIS — E1142 Type 2 diabetes mellitus with diabetic polyneuropathy: Secondary | ICD-10-CM | POA: Diagnosis not present

## 2018-03-20 DIAGNOSIS — I10 Essential (primary) hypertension: Secondary | ICD-10-CM | POA: Diagnosis not present

## 2018-03-20 DIAGNOSIS — Z8679 Personal history of other diseases of the circulatory system: Secondary | ICD-10-CM | POA: Diagnosis not present

## 2018-03-25 DIAGNOSIS — R51 Headache: Secondary | ICD-10-CM | POA: Diagnosis not present

## 2018-03-25 DIAGNOSIS — Z9889 Other specified postprocedural states: Secondary | ICD-10-CM | POA: Diagnosis not present

## 2018-03-25 DIAGNOSIS — Z8669 Personal history of other diseases of the nervous system and sense organs: Secondary | ICD-10-CM | POA: Diagnosis not present

## 2018-04-02 DIAGNOSIS — Z1211 Encounter for screening for malignant neoplasm of colon: Secondary | ICD-10-CM | POA: Diagnosis not present

## 2018-04-10 DIAGNOSIS — Z1211 Encounter for screening for malignant neoplasm of colon: Secondary | ICD-10-CM | POA: Diagnosis not present

## 2018-04-10 DIAGNOSIS — G8929 Other chronic pain: Secondary | ICD-10-CM | POA: Diagnosis not present

## 2018-04-10 DIAGNOSIS — M545 Low back pain: Secondary | ICD-10-CM | POA: Diagnosis not present

## 2018-04-10 DIAGNOSIS — K219 Gastro-esophageal reflux disease without esophagitis: Secondary | ICD-10-CM | POA: Diagnosis not present

## 2018-04-10 DIAGNOSIS — E119 Type 2 diabetes mellitus without complications: Secondary | ICD-10-CM | POA: Diagnosis not present

## 2018-04-10 DIAGNOSIS — Z791 Long term (current) use of non-steroidal anti-inflammatories (NSAID): Secondary | ICD-10-CM | POA: Diagnosis not present

## 2018-04-10 DIAGNOSIS — Z794 Long term (current) use of insulin: Secondary | ICD-10-CM | POA: Diagnosis not present

## 2018-04-10 DIAGNOSIS — I10 Essential (primary) hypertension: Secondary | ICD-10-CM | POA: Diagnosis not present

## 2018-04-10 DIAGNOSIS — Z79899 Other long term (current) drug therapy: Secondary | ICD-10-CM | POA: Diagnosis not present

## 2018-04-21 DIAGNOSIS — R1013 Epigastric pain: Secondary | ICD-10-CM | POA: Diagnosis not present

## 2018-04-22 DIAGNOSIS — G4730 Sleep apnea, unspecified: Secondary | ICD-10-CM | POA: Diagnosis not present

## 2018-04-22 DIAGNOSIS — I10 Essential (primary) hypertension: Secondary | ICD-10-CM | POA: Diagnosis not present

## 2018-04-22 DIAGNOSIS — K219 Gastro-esophageal reflux disease without esophagitis: Secondary | ICD-10-CM | POA: Diagnosis not present

## 2018-04-22 DIAGNOSIS — Z87442 Personal history of urinary calculi: Secondary | ICD-10-CM | POA: Diagnosis not present

## 2018-04-22 DIAGNOSIS — R1013 Epigastric pain: Secondary | ICD-10-CM | POA: Diagnosis not present

## 2018-04-22 DIAGNOSIS — R1011 Right upper quadrant pain: Secondary | ICD-10-CM | POA: Diagnosis not present

## 2018-04-22 DIAGNOSIS — E119 Type 2 diabetes mellitus without complications: Secondary | ICD-10-CM | POA: Diagnosis not present

## 2018-04-22 DIAGNOSIS — M1990 Unspecified osteoarthritis, unspecified site: Secondary | ICD-10-CM | POA: Diagnosis not present

## 2018-04-27 DIAGNOSIS — R69 Illness, unspecified: Secondary | ICD-10-CM | POA: Diagnosis not present

## 2018-04-28 DIAGNOSIS — Z6829 Body mass index (BMI) 29.0-29.9, adult: Secondary | ICD-10-CM | POA: Diagnosis not present

## 2018-04-28 DIAGNOSIS — Z299 Encounter for prophylactic measures, unspecified: Secondary | ICD-10-CM | POA: Diagnosis not present

## 2018-04-28 DIAGNOSIS — E1165 Type 2 diabetes mellitus with hyperglycemia: Secondary | ICD-10-CM | POA: Diagnosis not present

## 2018-04-28 DIAGNOSIS — E278 Other specified disorders of adrenal gland: Secondary | ICD-10-CM | POA: Diagnosis not present

## 2018-04-28 DIAGNOSIS — I10 Essential (primary) hypertension: Secondary | ICD-10-CM | POA: Diagnosis not present

## 2018-04-28 DIAGNOSIS — E1142 Type 2 diabetes mellitus with diabetic polyneuropathy: Secondary | ICD-10-CM | POA: Diagnosis not present

## 2018-05-06 DIAGNOSIS — R1013 Epigastric pain: Secondary | ICD-10-CM | POA: Diagnosis not present

## 2018-05-25 DIAGNOSIS — R69 Illness, unspecified: Secondary | ICD-10-CM | POA: Diagnosis not present

## 2018-05-29 DIAGNOSIS — R69 Illness, unspecified: Secondary | ICD-10-CM | POA: Diagnosis not present

## 2018-06-02 DIAGNOSIS — R109 Unspecified abdominal pain: Secondary | ICD-10-CM | POA: Diagnosis not present

## 2018-06-02 DIAGNOSIS — E1165 Type 2 diabetes mellitus with hyperglycemia: Secondary | ICD-10-CM | POA: Diagnosis not present

## 2018-06-02 DIAGNOSIS — E1142 Type 2 diabetes mellitus with diabetic polyneuropathy: Secondary | ICD-10-CM | POA: Diagnosis not present

## 2018-06-02 DIAGNOSIS — Z6830 Body mass index (BMI) 30.0-30.9, adult: Secondary | ICD-10-CM | POA: Diagnosis not present

## 2018-06-02 DIAGNOSIS — E278 Other specified disorders of adrenal gland: Secondary | ICD-10-CM | POA: Diagnosis not present

## 2018-06-02 DIAGNOSIS — Z299 Encounter for prophylactic measures, unspecified: Secondary | ICD-10-CM | POA: Diagnosis not present

## 2018-06-02 DIAGNOSIS — I10 Essential (primary) hypertension: Secondary | ICD-10-CM | POA: Diagnosis not present

## 2018-06-17 DIAGNOSIS — E11319 Type 2 diabetes mellitus with unspecified diabetic retinopathy without macular edema: Secondary | ICD-10-CM | POA: Diagnosis not present

## 2018-06-17 DIAGNOSIS — H524 Presbyopia: Secondary | ICD-10-CM | POA: Diagnosis not present

## 2018-06-23 DIAGNOSIS — Z6829 Body mass index (BMI) 29.0-29.9, adult: Secondary | ICD-10-CM | POA: Diagnosis not present

## 2018-06-23 DIAGNOSIS — N200 Calculus of kidney: Secondary | ICD-10-CM | POA: Diagnosis not present

## 2018-06-23 DIAGNOSIS — Z299 Encounter for prophylactic measures, unspecified: Secondary | ICD-10-CM | POA: Diagnosis not present

## 2018-06-23 DIAGNOSIS — E278 Other specified disorders of adrenal gland: Secondary | ICD-10-CM | POA: Diagnosis not present

## 2018-06-23 DIAGNOSIS — M1712 Unilateral primary osteoarthritis, left knee: Secondary | ICD-10-CM | POA: Diagnosis not present

## 2018-06-23 DIAGNOSIS — I10 Essential (primary) hypertension: Secondary | ICD-10-CM | POA: Diagnosis not present

## 2018-06-23 DIAGNOSIS — E1142 Type 2 diabetes mellitus with diabetic polyneuropathy: Secondary | ICD-10-CM | POA: Diagnosis not present

## 2018-07-01 DIAGNOSIS — K5790 Diverticulosis of intestine, part unspecified, without perforation or abscess without bleeding: Secondary | ICD-10-CM | POA: Diagnosis not present

## 2018-07-01 DIAGNOSIS — N200 Calculus of kidney: Secondary | ICD-10-CM | POA: Diagnosis not present

## 2018-07-01 DIAGNOSIS — R109 Unspecified abdominal pain: Secondary | ICD-10-CM | POA: Diagnosis not present

## 2018-07-02 DIAGNOSIS — G8929 Other chronic pain: Secondary | ICD-10-CM | POA: Diagnosis not present

## 2018-07-02 DIAGNOSIS — J069 Acute upper respiratory infection, unspecified: Secondary | ICD-10-CM | POA: Diagnosis not present

## 2018-07-02 DIAGNOSIS — Z6829 Body mass index (BMI) 29.0-29.9, adult: Secondary | ICD-10-CM | POA: Diagnosis not present

## 2018-07-02 DIAGNOSIS — R05 Cough: Secondary | ICD-10-CM | POA: Diagnosis not present

## 2018-07-02 DIAGNOSIS — I10 Essential (primary) hypertension: Secondary | ICD-10-CM | POA: Diagnosis not present

## 2018-07-02 DIAGNOSIS — Z299 Encounter for prophylactic measures, unspecified: Secondary | ICD-10-CM | POA: Diagnosis not present

## 2018-07-02 DIAGNOSIS — M545 Low back pain: Secondary | ICD-10-CM | POA: Diagnosis not present

## 2018-08-07 DIAGNOSIS — E278 Other specified disorders of adrenal gland: Secondary | ICD-10-CM | POA: Diagnosis not present

## 2018-08-07 DIAGNOSIS — I10 Essential (primary) hypertension: Secondary | ICD-10-CM | POA: Diagnosis not present

## 2018-08-07 DIAGNOSIS — E1165 Type 2 diabetes mellitus with hyperglycemia: Secondary | ICD-10-CM | POA: Diagnosis not present

## 2018-08-07 DIAGNOSIS — Z87891 Personal history of nicotine dependence: Secondary | ICD-10-CM | POA: Diagnosis not present

## 2018-08-07 DIAGNOSIS — E1142 Type 2 diabetes mellitus with diabetic polyneuropathy: Secondary | ICD-10-CM | POA: Diagnosis not present

## 2018-08-07 DIAGNOSIS — Z299 Encounter for prophylactic measures, unspecified: Secondary | ICD-10-CM | POA: Diagnosis not present

## 2018-08-25 DIAGNOSIS — R69 Illness, unspecified: Secondary | ICD-10-CM | POA: Diagnosis not present

## 2018-09-17 DIAGNOSIS — Z6830 Body mass index (BMI) 30.0-30.9, adult: Secondary | ICD-10-CM | POA: Diagnosis not present

## 2018-09-17 DIAGNOSIS — Z79899 Other long term (current) drug therapy: Secondary | ICD-10-CM | POA: Diagnosis not present

## 2018-09-17 DIAGNOSIS — I10 Essential (primary) hypertension: Secondary | ICD-10-CM | POA: Diagnosis not present

## 2018-09-17 DIAGNOSIS — E1165 Type 2 diabetes mellitus with hyperglycemia: Secondary | ICD-10-CM | POA: Diagnosis not present

## 2018-09-17 DIAGNOSIS — Z299 Encounter for prophylactic measures, unspecified: Secondary | ICD-10-CM | POA: Diagnosis not present

## 2018-09-17 DIAGNOSIS — E1142 Type 2 diabetes mellitus with diabetic polyneuropathy: Secondary | ICD-10-CM | POA: Diagnosis not present

## 2018-09-17 DIAGNOSIS — E278 Other specified disorders of adrenal gland: Secondary | ICD-10-CM | POA: Diagnosis not present

## 2018-10-15 DIAGNOSIS — R21 Rash and other nonspecific skin eruption: Secondary | ICD-10-CM | POA: Diagnosis not present

## 2018-10-15 DIAGNOSIS — M1712 Unilateral primary osteoarthritis, left knee: Secondary | ICD-10-CM | POA: Diagnosis not present

## 2018-10-15 DIAGNOSIS — G8929 Other chronic pain: Secondary | ICD-10-CM | POA: Diagnosis not present

## 2018-10-15 DIAGNOSIS — M545 Low back pain: Secondary | ICD-10-CM | POA: Diagnosis not present

## 2018-10-15 DIAGNOSIS — Z299 Encounter for prophylactic measures, unspecified: Secondary | ICD-10-CM | POA: Diagnosis not present

## 2018-10-15 DIAGNOSIS — Z6830 Body mass index (BMI) 30.0-30.9, adult: Secondary | ICD-10-CM | POA: Diagnosis not present

## 2018-10-15 DIAGNOSIS — I10 Essential (primary) hypertension: Secondary | ICD-10-CM | POA: Diagnosis not present

## 2018-10-15 DIAGNOSIS — E1165 Type 2 diabetes mellitus with hyperglycemia: Secondary | ICD-10-CM | POA: Diagnosis not present

## 2018-11-16 DIAGNOSIS — E278 Other specified disorders of adrenal gland: Secondary | ICD-10-CM | POA: Diagnosis not present

## 2018-11-16 DIAGNOSIS — Z6832 Body mass index (BMI) 32.0-32.9, adult: Secondary | ICD-10-CM | POA: Diagnosis not present

## 2018-11-16 DIAGNOSIS — G4700 Insomnia, unspecified: Secondary | ICD-10-CM | POA: Diagnosis not present

## 2018-11-16 DIAGNOSIS — E1165 Type 2 diabetes mellitus with hyperglycemia: Secondary | ICD-10-CM | POA: Diagnosis not present

## 2018-11-16 DIAGNOSIS — Z299 Encounter for prophylactic measures, unspecified: Secondary | ICD-10-CM | POA: Diagnosis not present

## 2018-11-16 DIAGNOSIS — I10 Essential (primary) hypertension: Secondary | ICD-10-CM | POA: Diagnosis not present

## 2018-11-16 DIAGNOSIS — E1142 Type 2 diabetes mellitus with diabetic polyneuropathy: Secondary | ICD-10-CM | POA: Diagnosis not present

## 2018-11-23 DIAGNOSIS — R69 Illness, unspecified: Secondary | ICD-10-CM | POA: Diagnosis not present

## 2018-11-24 DIAGNOSIS — R69 Illness, unspecified: Secondary | ICD-10-CM | POA: Diagnosis not present

## 2018-12-02 DIAGNOSIS — E109 Type 1 diabetes mellitus without complications: Secondary | ICD-10-CM | POA: Diagnosis not present

## 2018-12-02 DIAGNOSIS — E1140 Type 2 diabetes mellitus with diabetic neuropathy, unspecified: Secondary | ICD-10-CM | POA: Diagnosis not present

## 2018-12-02 DIAGNOSIS — L84 Corns and callosities: Secondary | ICD-10-CM | POA: Diagnosis not present

## 2018-12-08 DIAGNOSIS — Z299 Encounter for prophylactic measures, unspecified: Secondary | ICD-10-CM | POA: Diagnosis not present

## 2018-12-08 DIAGNOSIS — E278 Other specified disorders of adrenal gland: Secondary | ICD-10-CM | POA: Diagnosis not present

## 2018-12-08 DIAGNOSIS — I10 Essential (primary) hypertension: Secondary | ICD-10-CM | POA: Diagnosis not present

## 2018-12-08 DIAGNOSIS — Z6832 Body mass index (BMI) 32.0-32.9, adult: Secondary | ICD-10-CM | POA: Diagnosis not present

## 2018-12-08 DIAGNOSIS — G8929 Other chronic pain: Secondary | ICD-10-CM | POA: Diagnosis not present

## 2018-12-08 DIAGNOSIS — M545 Low back pain: Secondary | ICD-10-CM | POA: Diagnosis not present

## 2019-01-08 DIAGNOSIS — Z20828 Contact with and (suspected) exposure to other viral communicable diseases: Secondary | ICD-10-CM | POA: Diagnosis not present

## 2019-01-08 DIAGNOSIS — Z299 Encounter for prophylactic measures, unspecified: Secondary | ICD-10-CM | POA: Diagnosis not present

## 2019-01-08 DIAGNOSIS — I10 Essential (primary) hypertension: Secondary | ICD-10-CM | POA: Diagnosis not present

## 2019-01-08 DIAGNOSIS — Z6832 Body mass index (BMI) 32.0-32.9, adult: Secondary | ICD-10-CM | POA: Diagnosis not present

## 2019-01-08 DIAGNOSIS — Z713 Dietary counseling and surveillance: Secondary | ICD-10-CM | POA: Diagnosis not present

## 2019-02-15 DIAGNOSIS — E7800 Pure hypercholesterolemia, unspecified: Secondary | ICD-10-CM | POA: Diagnosis not present

## 2019-02-15 DIAGNOSIS — E1165 Type 2 diabetes mellitus with hyperglycemia: Secondary | ICD-10-CM | POA: Diagnosis not present

## 2019-02-15 DIAGNOSIS — E1142 Type 2 diabetes mellitus with diabetic polyneuropathy: Secondary | ICD-10-CM | POA: Diagnosis not present

## 2019-02-15 DIAGNOSIS — Z299 Encounter for prophylactic measures, unspecified: Secondary | ICD-10-CM | POA: Diagnosis not present

## 2019-02-15 DIAGNOSIS — Z6831 Body mass index (BMI) 31.0-31.9, adult: Secondary | ICD-10-CM | POA: Diagnosis not present

## 2019-02-15 DIAGNOSIS — I10 Essential (primary) hypertension: Secondary | ICD-10-CM | POA: Diagnosis not present

## 2019-02-17 DIAGNOSIS — R69 Illness, unspecified: Secondary | ICD-10-CM | POA: Diagnosis not present

## 2019-02-19 DIAGNOSIS — R69 Illness, unspecified: Secondary | ICD-10-CM | POA: Diagnosis not present

## 2019-03-09 DIAGNOSIS — R69 Illness, unspecified: Secondary | ICD-10-CM | POA: Diagnosis not present

## 2019-03-09 DIAGNOSIS — E1165 Type 2 diabetes mellitus with hyperglycemia: Secondary | ICD-10-CM | POA: Diagnosis not present

## 2019-03-09 DIAGNOSIS — Z1211 Encounter for screening for malignant neoplasm of colon: Secondary | ICD-10-CM | POA: Diagnosis not present

## 2019-03-09 DIAGNOSIS — Z1331 Encounter for screening for depression: Secondary | ICD-10-CM | POA: Diagnosis not present

## 2019-03-09 DIAGNOSIS — Z79899 Other long term (current) drug therapy: Secondary | ICD-10-CM | POA: Diagnosis not present

## 2019-03-09 DIAGNOSIS — Z125 Encounter for screening for malignant neoplasm of prostate: Secondary | ICD-10-CM | POA: Diagnosis not present

## 2019-03-09 DIAGNOSIS — Z7189 Other specified counseling: Secondary | ICD-10-CM | POA: Diagnosis not present

## 2019-03-09 DIAGNOSIS — Z6831 Body mass index (BMI) 31.0-31.9, adult: Secondary | ICD-10-CM | POA: Diagnosis not present

## 2019-03-09 DIAGNOSIS — Z1339 Encounter for screening examination for other mental health and behavioral disorders: Secondary | ICD-10-CM | POA: Diagnosis not present

## 2019-03-09 DIAGNOSIS — E7800 Pure hypercholesterolemia, unspecified: Secondary | ICD-10-CM | POA: Diagnosis not present

## 2019-03-09 DIAGNOSIS — Z0000 Encounter for general adult medical examination without abnormal findings: Secondary | ICD-10-CM | POA: Diagnosis not present

## 2019-03-09 DIAGNOSIS — Z299 Encounter for prophylactic measures, unspecified: Secondary | ICD-10-CM | POA: Diagnosis not present

## 2019-04-13 DIAGNOSIS — G8929 Other chronic pain: Secondary | ICD-10-CM | POA: Diagnosis not present

## 2019-04-13 DIAGNOSIS — Z6830 Body mass index (BMI) 30.0-30.9, adult: Secondary | ICD-10-CM | POA: Diagnosis not present

## 2019-04-13 DIAGNOSIS — M545 Low back pain: Secondary | ICD-10-CM | POA: Diagnosis not present

## 2019-04-13 DIAGNOSIS — I10 Essential (primary) hypertension: Secondary | ICD-10-CM | POA: Diagnosis not present

## 2019-04-13 DIAGNOSIS — E1165 Type 2 diabetes mellitus with hyperglycemia: Secondary | ICD-10-CM | POA: Diagnosis not present

## 2019-04-13 DIAGNOSIS — Z299 Encounter for prophylactic measures, unspecified: Secondary | ICD-10-CM | POA: Diagnosis not present

## 2019-04-26 DIAGNOSIS — Z23 Encounter for immunization: Secondary | ICD-10-CM | POA: Diagnosis not present

## 2019-05-13 DIAGNOSIS — I10 Essential (primary) hypertension: Secondary | ICD-10-CM | POA: Diagnosis not present

## 2019-05-13 DIAGNOSIS — E1142 Type 2 diabetes mellitus with diabetic polyneuropathy: Secondary | ICD-10-CM | POA: Diagnosis not present

## 2019-05-13 DIAGNOSIS — Z6830 Body mass index (BMI) 30.0-30.9, adult: Secondary | ICD-10-CM | POA: Diagnosis not present

## 2019-05-13 DIAGNOSIS — R69 Illness, unspecified: Secondary | ICD-10-CM | POA: Diagnosis not present

## 2019-05-13 DIAGNOSIS — Z299 Encounter for prophylactic measures, unspecified: Secondary | ICD-10-CM | POA: Diagnosis not present

## 2019-05-13 DIAGNOSIS — E278 Other specified disorders of adrenal gland: Secondary | ICD-10-CM | POA: Diagnosis not present

## 2019-05-13 DIAGNOSIS — E1165 Type 2 diabetes mellitus with hyperglycemia: Secondary | ICD-10-CM | POA: Diagnosis not present

## 2019-05-18 DIAGNOSIS — R69 Illness, unspecified: Secondary | ICD-10-CM | POA: Diagnosis not present

## 2019-05-26 DIAGNOSIS — R69 Illness, unspecified: Secondary | ICD-10-CM | POA: Diagnosis not present

## 2019-05-31 DIAGNOSIS — E7800 Pure hypercholesterolemia, unspecified: Secondary | ICD-10-CM | POA: Diagnosis not present

## 2019-05-31 DIAGNOSIS — E1165 Type 2 diabetes mellitus with hyperglycemia: Secondary | ICD-10-CM | POA: Diagnosis not present

## 2019-05-31 DIAGNOSIS — E538 Deficiency of other specified B group vitamins: Secondary | ICD-10-CM | POA: Diagnosis not present

## 2019-05-31 DIAGNOSIS — I10 Essential (primary) hypertension: Secondary | ICD-10-CM | POA: Diagnosis not present

## 2019-05-31 DIAGNOSIS — Z299 Encounter for prophylactic measures, unspecified: Secondary | ICD-10-CM | POA: Diagnosis not present

## 2019-05-31 DIAGNOSIS — E1142 Type 2 diabetes mellitus with diabetic polyneuropathy: Secondary | ICD-10-CM | POA: Diagnosis not present

## 2019-05-31 DIAGNOSIS — Z6831 Body mass index (BMI) 31.0-31.9, adult: Secondary | ICD-10-CM | POA: Diagnosis not present

## 2019-06-08 DIAGNOSIS — Z299 Encounter for prophylactic measures, unspecified: Secondary | ICD-10-CM | POA: Diagnosis not present

## 2019-06-08 DIAGNOSIS — E538 Deficiency of other specified B group vitamins: Secondary | ICD-10-CM | POA: Diagnosis not present

## 2019-06-08 DIAGNOSIS — I10 Essential (primary) hypertension: Secondary | ICD-10-CM | POA: Diagnosis not present

## 2019-06-08 DIAGNOSIS — Z6831 Body mass index (BMI) 31.0-31.9, adult: Secondary | ICD-10-CM | POA: Diagnosis not present

## 2019-06-08 DIAGNOSIS — E278 Other specified disorders of adrenal gland: Secondary | ICD-10-CM | POA: Diagnosis not present

## 2019-06-08 DIAGNOSIS — E1165 Type 2 diabetes mellitus with hyperglycemia: Secondary | ICD-10-CM | POA: Diagnosis not present

## 2019-06-15 DIAGNOSIS — E538 Deficiency of other specified B group vitamins: Secondary | ICD-10-CM | POA: Diagnosis not present

## 2019-06-15 DIAGNOSIS — E1142 Type 2 diabetes mellitus with diabetic polyneuropathy: Secondary | ICD-10-CM | POA: Diagnosis not present

## 2019-06-15 DIAGNOSIS — I10 Essential (primary) hypertension: Secondary | ICD-10-CM | POA: Diagnosis not present

## 2019-06-15 DIAGNOSIS — Z299 Encounter for prophylactic measures, unspecified: Secondary | ICD-10-CM | POA: Diagnosis not present

## 2019-06-15 DIAGNOSIS — E1165 Type 2 diabetes mellitus with hyperglycemia: Secondary | ICD-10-CM | POA: Diagnosis not present

## 2019-06-15 DIAGNOSIS — Z6831 Body mass index (BMI) 31.0-31.9, adult: Secondary | ICD-10-CM | POA: Diagnosis not present

## 2019-06-22 DIAGNOSIS — Z6831 Body mass index (BMI) 31.0-31.9, adult: Secondary | ICD-10-CM | POA: Diagnosis not present

## 2019-06-22 DIAGNOSIS — E538 Deficiency of other specified B group vitamins: Secondary | ICD-10-CM | POA: Diagnosis not present

## 2019-06-22 DIAGNOSIS — Z299 Encounter for prophylactic measures, unspecified: Secondary | ICD-10-CM | POA: Diagnosis not present

## 2019-06-22 DIAGNOSIS — R234 Changes in skin texture: Secondary | ICD-10-CM | POA: Diagnosis not present

## 2019-06-22 DIAGNOSIS — L301 Dyshidrosis [pompholyx]: Secondary | ICD-10-CM | POA: Diagnosis not present

## 2019-06-22 DIAGNOSIS — E1142 Type 2 diabetes mellitus with diabetic polyneuropathy: Secondary | ICD-10-CM | POA: Diagnosis not present

## 2019-07-15 DIAGNOSIS — Z299 Encounter for prophylactic measures, unspecified: Secondary | ICD-10-CM | POA: Diagnosis not present

## 2019-07-15 DIAGNOSIS — M1712 Unilateral primary osteoarthritis, left knee: Secondary | ICD-10-CM | POA: Diagnosis not present

## 2019-07-15 DIAGNOSIS — Z6831 Body mass index (BMI) 31.0-31.9, adult: Secondary | ICD-10-CM | POA: Diagnosis not present

## 2019-07-15 DIAGNOSIS — E538 Deficiency of other specified B group vitamins: Secondary | ICD-10-CM | POA: Diagnosis not present

## 2019-07-15 DIAGNOSIS — I10 Essential (primary) hypertension: Secondary | ICD-10-CM | POA: Diagnosis not present

## 2019-07-15 DIAGNOSIS — E1142 Type 2 diabetes mellitus with diabetic polyneuropathy: Secondary | ICD-10-CM | POA: Diagnosis not present

## 2019-08-09 DIAGNOSIS — L570 Actinic keratosis: Secondary | ICD-10-CM | POA: Diagnosis not present

## 2019-08-09 DIAGNOSIS — L309 Dermatitis, unspecified: Secondary | ICD-10-CM | POA: Diagnosis not present

## 2019-08-09 DIAGNOSIS — L0100 Impetigo, unspecified: Secondary | ICD-10-CM | POA: Diagnosis not present

## 2019-08-10 DIAGNOSIS — L0100 Impetigo, unspecified: Secondary | ICD-10-CM | POA: Diagnosis not present

## 2019-08-16 DIAGNOSIS — Z299 Encounter for prophylactic measures, unspecified: Secondary | ICD-10-CM | POA: Diagnosis not present

## 2019-08-16 DIAGNOSIS — Z6831 Body mass index (BMI) 31.0-31.9, adult: Secondary | ICD-10-CM | POA: Diagnosis not present

## 2019-08-16 DIAGNOSIS — I10 Essential (primary) hypertension: Secondary | ICD-10-CM | POA: Diagnosis not present

## 2019-08-16 DIAGNOSIS — E1142 Type 2 diabetes mellitus with diabetic polyneuropathy: Secondary | ICD-10-CM | POA: Diagnosis not present

## 2019-08-16 DIAGNOSIS — G4700 Insomnia, unspecified: Secondary | ICD-10-CM | POA: Diagnosis not present

## 2019-08-16 DIAGNOSIS — Z87891 Personal history of nicotine dependence: Secondary | ICD-10-CM | POA: Diagnosis not present

## 2019-08-16 DIAGNOSIS — E1165 Type 2 diabetes mellitus with hyperglycemia: Secondary | ICD-10-CM | POA: Diagnosis not present

## 2019-08-20 DIAGNOSIS — R69 Illness, unspecified: Secondary | ICD-10-CM | POA: Diagnosis not present

## 2019-08-23 DIAGNOSIS — R69 Illness, unspecified: Secondary | ICD-10-CM | POA: Diagnosis not present

## 2019-08-31 DIAGNOSIS — L309 Dermatitis, unspecified: Secondary | ICD-10-CM | POA: Diagnosis not present

## 2019-09-06 DIAGNOSIS — Z6831 Body mass index (BMI) 31.0-31.9, adult: Secondary | ICD-10-CM | POA: Diagnosis not present

## 2019-09-06 DIAGNOSIS — E1142 Type 2 diabetes mellitus with diabetic polyneuropathy: Secondary | ICD-10-CM | POA: Diagnosis not present

## 2019-09-06 DIAGNOSIS — G8929 Other chronic pain: Secondary | ICD-10-CM | POA: Diagnosis not present

## 2019-09-06 DIAGNOSIS — Z299 Encounter for prophylactic measures, unspecified: Secondary | ICD-10-CM | POA: Diagnosis not present

## 2019-09-06 DIAGNOSIS — E538 Deficiency of other specified B group vitamins: Secondary | ICD-10-CM | POA: Diagnosis not present

## 2019-09-06 DIAGNOSIS — M545 Low back pain: Secondary | ICD-10-CM | POA: Diagnosis not present

## 2019-09-06 DIAGNOSIS — E1165 Type 2 diabetes mellitus with hyperglycemia: Secondary | ICD-10-CM | POA: Diagnosis not present

## 2019-09-06 DIAGNOSIS — I10 Essential (primary) hypertension: Secondary | ICD-10-CM | POA: Diagnosis not present

## 2019-09-09 DIAGNOSIS — Z23 Encounter for immunization: Secondary | ICD-10-CM | POA: Diagnosis not present

## 2019-10-05 DIAGNOSIS — E109 Type 1 diabetes mellitus without complications: Secondary | ICD-10-CM | POA: Diagnosis not present

## 2019-10-05 DIAGNOSIS — L84 Corns and callosities: Secondary | ICD-10-CM | POA: Diagnosis not present

## 2019-10-05 DIAGNOSIS — E1140 Type 2 diabetes mellitus with diabetic neuropathy, unspecified: Secondary | ICD-10-CM | POA: Diagnosis not present

## 2019-10-06 DIAGNOSIS — Z23 Encounter for immunization: Secondary | ICD-10-CM | POA: Diagnosis not present

## 2019-10-07 DIAGNOSIS — Z23 Encounter for immunization: Secondary | ICD-10-CM | POA: Diagnosis not present

## 2019-10-14 DIAGNOSIS — E538 Deficiency of other specified B group vitamins: Secondary | ICD-10-CM | POA: Diagnosis not present

## 2019-10-14 DIAGNOSIS — I10 Essential (primary) hypertension: Secondary | ICD-10-CM | POA: Diagnosis not present

## 2019-10-14 DIAGNOSIS — E1142 Type 2 diabetes mellitus with diabetic polyneuropathy: Secondary | ICD-10-CM | POA: Diagnosis not present

## 2019-10-14 DIAGNOSIS — Z299 Encounter for prophylactic measures, unspecified: Secondary | ICD-10-CM | POA: Diagnosis not present

## 2019-10-14 DIAGNOSIS — E1165 Type 2 diabetes mellitus with hyperglycemia: Secondary | ICD-10-CM | POA: Diagnosis not present

## 2019-10-14 DIAGNOSIS — Z87891 Personal history of nicotine dependence: Secondary | ICD-10-CM | POA: Diagnosis not present

## 2019-10-14 DIAGNOSIS — Z6831 Body mass index (BMI) 31.0-31.9, adult: Secondary | ICD-10-CM | POA: Diagnosis not present

## 2019-11-01 DIAGNOSIS — Z23 Encounter for immunization: Secondary | ICD-10-CM | POA: Diagnosis not present

## 2019-11-09 DIAGNOSIS — G8929 Other chronic pain: Secondary | ICD-10-CM | POA: Diagnosis not present

## 2019-11-09 DIAGNOSIS — M5442 Lumbago with sciatica, left side: Secondary | ICD-10-CM | POA: Diagnosis not present

## 2019-11-09 DIAGNOSIS — M5441 Lumbago with sciatica, right side: Secondary | ICD-10-CM | POA: Diagnosis not present

## 2019-11-15 DIAGNOSIS — E1142 Type 2 diabetes mellitus with diabetic polyneuropathy: Secondary | ICD-10-CM | POA: Diagnosis not present

## 2019-11-15 DIAGNOSIS — I10 Essential (primary) hypertension: Secondary | ICD-10-CM | POA: Diagnosis not present

## 2019-11-15 DIAGNOSIS — E1165 Type 2 diabetes mellitus with hyperglycemia: Secondary | ICD-10-CM | POA: Diagnosis not present

## 2019-11-15 DIAGNOSIS — E278 Other specified disorders of adrenal gland: Secondary | ICD-10-CM | POA: Diagnosis not present

## 2019-11-15 DIAGNOSIS — M19042 Primary osteoarthritis, left hand: Secondary | ICD-10-CM | POA: Diagnosis not present

## 2019-11-15 DIAGNOSIS — M79642 Pain in left hand: Secondary | ICD-10-CM | POA: Diagnosis not present

## 2019-11-15 DIAGNOSIS — Z299 Encounter for prophylactic measures, unspecified: Secondary | ICD-10-CM | POA: Diagnosis not present

## 2019-11-15 DIAGNOSIS — E7800 Pure hypercholesterolemia, unspecified: Secondary | ICD-10-CM | POA: Diagnosis not present

## 2019-11-15 DIAGNOSIS — E538 Deficiency of other specified B group vitamins: Secondary | ICD-10-CM | POA: Diagnosis not present

## 2019-11-16 DIAGNOSIS — R69 Illness, unspecified: Secondary | ICD-10-CM | POA: Diagnosis not present

## 2019-11-19 ENCOUNTER — Other Ambulatory Visit: Payer: Self-pay | Admitting: Neurosurgery

## 2019-11-19 DIAGNOSIS — M5442 Lumbago with sciatica, left side: Secondary | ICD-10-CM

## 2019-11-19 DIAGNOSIS — R69 Illness, unspecified: Secondary | ICD-10-CM | POA: Diagnosis not present

## 2019-11-19 DIAGNOSIS — G8929 Other chronic pain: Secondary | ICD-10-CM

## 2019-12-10 DIAGNOSIS — E1142 Type 2 diabetes mellitus with diabetic polyneuropathy: Secondary | ICD-10-CM | POA: Diagnosis not present

## 2019-12-10 DIAGNOSIS — M25562 Pain in left knee: Secondary | ICD-10-CM | POA: Diagnosis not present

## 2019-12-10 DIAGNOSIS — Z6831 Body mass index (BMI) 31.0-31.9, adult: Secondary | ICD-10-CM | POA: Diagnosis not present

## 2019-12-10 DIAGNOSIS — Z299 Encounter for prophylactic measures, unspecified: Secondary | ICD-10-CM | POA: Diagnosis not present

## 2019-12-10 DIAGNOSIS — M1712 Unilateral primary osteoarthritis, left knee: Secondary | ICD-10-CM | POA: Diagnosis not present

## 2019-12-10 DIAGNOSIS — M25561 Pain in right knee: Secondary | ICD-10-CM | POA: Diagnosis not present

## 2019-12-10 DIAGNOSIS — I10 Essential (primary) hypertension: Secondary | ICD-10-CM | POA: Diagnosis not present

## 2019-12-10 DIAGNOSIS — E538 Deficiency of other specified B group vitamins: Secondary | ICD-10-CM | POA: Diagnosis not present

## 2019-12-10 DIAGNOSIS — E1165 Type 2 diabetes mellitus with hyperglycemia: Secondary | ICD-10-CM | POA: Diagnosis not present

## 2019-12-10 DIAGNOSIS — M545 Low back pain: Secondary | ICD-10-CM | POA: Diagnosis not present

## 2019-12-20 ENCOUNTER — Ambulatory Visit
Admission: RE | Admit: 2019-12-20 | Discharge: 2019-12-20 | Disposition: A | Discharge status: Home / Self Care | Payer: Medicare HMO | Source: Ambulatory Visit | Attending: Neurosurgery | Admitting: Neurosurgery

## 2019-12-20 DIAGNOSIS — M48061 Spinal stenosis, lumbar region without neurogenic claudication: Secondary | ICD-10-CM | POA: Diagnosis not present

## 2019-12-20 DIAGNOSIS — G8929 Other chronic pain: Secondary | ICD-10-CM

## 2020-01-10 DIAGNOSIS — M5442 Lumbago with sciatica, left side: Secondary | ICD-10-CM | POA: Diagnosis not present

## 2020-01-10 DIAGNOSIS — M9903 Segmental and somatic dysfunction of lumbar region: Secondary | ICD-10-CM | POA: Diagnosis not present

## 2020-01-10 DIAGNOSIS — M9905 Segmental and somatic dysfunction of pelvic region: Secondary | ICD-10-CM | POA: Diagnosis not present

## 2020-01-10 DIAGNOSIS — M9902 Segmental and somatic dysfunction of thoracic region: Secondary | ICD-10-CM | POA: Diagnosis not present

## 2020-01-12 DIAGNOSIS — M9903 Segmental and somatic dysfunction of lumbar region: Secondary | ICD-10-CM | POA: Diagnosis not present

## 2020-01-12 DIAGNOSIS — M9905 Segmental and somatic dysfunction of pelvic region: Secondary | ICD-10-CM | POA: Diagnosis not present

## 2020-01-12 DIAGNOSIS — M5442 Lumbago with sciatica, left side: Secondary | ICD-10-CM | POA: Diagnosis not present

## 2020-01-12 DIAGNOSIS — M9902 Segmental and somatic dysfunction of thoracic region: Secondary | ICD-10-CM | POA: Diagnosis not present

## 2020-01-13 DIAGNOSIS — E1165 Type 2 diabetes mellitus with hyperglycemia: Secondary | ICD-10-CM | POA: Diagnosis not present

## 2020-01-13 DIAGNOSIS — I10 Essential (primary) hypertension: Secondary | ICD-10-CM | POA: Diagnosis not present

## 2020-01-13 DIAGNOSIS — E538 Deficiency of other specified B group vitamins: Secondary | ICD-10-CM | POA: Diagnosis not present

## 2020-01-13 DIAGNOSIS — Z6831 Body mass index (BMI) 31.0-31.9, adult: Secondary | ICD-10-CM | POA: Diagnosis not present

## 2020-01-13 DIAGNOSIS — E1142 Type 2 diabetes mellitus with diabetic polyneuropathy: Secondary | ICD-10-CM | POA: Diagnosis not present

## 2020-01-13 DIAGNOSIS — R69 Illness, unspecified: Secondary | ICD-10-CM | POA: Diagnosis not present

## 2020-01-13 DIAGNOSIS — Z299 Encounter for prophylactic measures, unspecified: Secondary | ICD-10-CM | POA: Diagnosis not present

## 2020-01-14 DIAGNOSIS — M5442 Lumbago with sciatica, left side: Secondary | ICD-10-CM | POA: Diagnosis not present

## 2020-01-14 DIAGNOSIS — M9905 Segmental and somatic dysfunction of pelvic region: Secondary | ICD-10-CM | POA: Diagnosis not present

## 2020-01-14 DIAGNOSIS — M9902 Segmental and somatic dysfunction of thoracic region: Secondary | ICD-10-CM | POA: Diagnosis not present

## 2020-01-14 DIAGNOSIS — M9903 Segmental and somatic dysfunction of lumbar region: Secondary | ICD-10-CM | POA: Diagnosis not present

## 2020-01-17 DIAGNOSIS — M9902 Segmental and somatic dysfunction of thoracic region: Secondary | ICD-10-CM | POA: Diagnosis not present

## 2020-01-17 DIAGNOSIS — M9903 Segmental and somatic dysfunction of lumbar region: Secondary | ICD-10-CM | POA: Diagnosis not present

## 2020-01-17 DIAGNOSIS — M9905 Segmental and somatic dysfunction of pelvic region: Secondary | ICD-10-CM | POA: Diagnosis not present

## 2020-01-17 DIAGNOSIS — M5442 Lumbago with sciatica, left side: Secondary | ICD-10-CM | POA: Diagnosis not present

## 2020-01-19 DIAGNOSIS — M9905 Segmental and somatic dysfunction of pelvic region: Secondary | ICD-10-CM | POA: Diagnosis not present

## 2020-01-19 DIAGNOSIS — M9903 Segmental and somatic dysfunction of lumbar region: Secondary | ICD-10-CM | POA: Diagnosis not present

## 2020-01-19 DIAGNOSIS — M5442 Lumbago with sciatica, left side: Secondary | ICD-10-CM | POA: Diagnosis not present

## 2020-01-19 DIAGNOSIS — M9902 Segmental and somatic dysfunction of thoracic region: Secondary | ICD-10-CM | POA: Diagnosis not present

## 2020-01-21 DIAGNOSIS — M9902 Segmental and somatic dysfunction of thoracic region: Secondary | ICD-10-CM | POA: Diagnosis not present

## 2020-01-21 DIAGNOSIS — M9903 Segmental and somatic dysfunction of lumbar region: Secondary | ICD-10-CM | POA: Diagnosis not present

## 2020-01-21 DIAGNOSIS — M9905 Segmental and somatic dysfunction of pelvic region: Secondary | ICD-10-CM | POA: Diagnosis not present

## 2020-01-21 DIAGNOSIS — M5442 Lumbago with sciatica, left side: Secondary | ICD-10-CM | POA: Diagnosis not present

## 2020-01-24 DIAGNOSIS — Z6829 Body mass index (BMI) 29.0-29.9, adult: Secondary | ICD-10-CM | POA: Diagnosis not present

## 2020-01-24 DIAGNOSIS — R030 Elevated blood-pressure reading, without diagnosis of hypertension: Secondary | ICD-10-CM | POA: Diagnosis not present

## 2020-01-24 DIAGNOSIS — M47816 Spondylosis without myelopathy or radiculopathy, lumbar region: Secondary | ICD-10-CM | POA: Diagnosis not present

## 2020-01-24 DIAGNOSIS — M48062 Spinal stenosis, lumbar region with neurogenic claudication: Secondary | ICD-10-CM | POA: Diagnosis not present

## 2020-02-10 DIAGNOSIS — R69 Illness, unspecified: Secondary | ICD-10-CM | POA: Diagnosis not present

## 2020-02-14 DIAGNOSIS — E538 Deficiency of other specified B group vitamins: Secondary | ICD-10-CM | POA: Diagnosis not present

## 2020-02-14 DIAGNOSIS — E1165 Type 2 diabetes mellitus with hyperglycemia: Secondary | ICD-10-CM | POA: Diagnosis not present

## 2020-02-14 DIAGNOSIS — I10 Essential (primary) hypertension: Secondary | ICD-10-CM | POA: Diagnosis not present

## 2020-02-14 DIAGNOSIS — Z299 Encounter for prophylactic measures, unspecified: Secondary | ICD-10-CM | POA: Diagnosis not present

## 2020-02-14 DIAGNOSIS — E1142 Type 2 diabetes mellitus with diabetic polyneuropathy: Secondary | ICD-10-CM | POA: Diagnosis not present

## 2020-02-14 DIAGNOSIS — Z6830 Body mass index (BMI) 30.0-30.9, adult: Secondary | ICD-10-CM | POA: Diagnosis not present

## 2020-02-23 DIAGNOSIS — M48062 Spinal stenosis, lumbar region with neurogenic claudication: Secondary | ICD-10-CM | POA: Diagnosis not present

## 2020-03-15 DIAGNOSIS — Z0000 Encounter for general adult medical examination without abnormal findings: Secondary | ICD-10-CM | POA: Diagnosis not present

## 2020-03-15 DIAGNOSIS — E1165 Type 2 diabetes mellitus with hyperglycemia: Secondary | ICD-10-CM | POA: Diagnosis not present

## 2020-03-15 DIAGNOSIS — E538 Deficiency of other specified B group vitamins: Secondary | ICD-10-CM | POA: Diagnosis not present

## 2020-03-15 DIAGNOSIS — Z6830 Body mass index (BMI) 30.0-30.9, adult: Secondary | ICD-10-CM | POA: Diagnosis not present

## 2020-03-15 DIAGNOSIS — Z1331 Encounter for screening for depression: Secondary | ICD-10-CM | POA: Diagnosis not present

## 2020-03-15 DIAGNOSIS — Z7189 Other specified counseling: Secondary | ICD-10-CM | POA: Diagnosis not present

## 2020-03-15 DIAGNOSIS — Z79899 Other long term (current) drug therapy: Secondary | ICD-10-CM | POA: Diagnosis not present

## 2020-03-15 DIAGNOSIS — Z299 Encounter for prophylactic measures, unspecified: Secondary | ICD-10-CM | POA: Diagnosis not present

## 2020-03-15 DIAGNOSIS — I10 Essential (primary) hypertension: Secondary | ICD-10-CM | POA: Diagnosis not present

## 2020-03-15 DIAGNOSIS — Z1339 Encounter for screening examination for other mental health and behavioral disorders: Secondary | ICD-10-CM | POA: Diagnosis not present

## 2020-03-15 DIAGNOSIS — E7800 Pure hypercholesterolemia, unspecified: Secondary | ICD-10-CM | POA: Diagnosis not present

## 2020-03-15 DIAGNOSIS — R5383 Other fatigue: Secondary | ICD-10-CM | POA: Diagnosis not present

## 2020-04-17 DIAGNOSIS — I10 Essential (primary) hypertension: Secondary | ICD-10-CM | POA: Diagnosis not present

## 2020-04-17 DIAGNOSIS — E538 Deficiency of other specified B group vitamins: Secondary | ICD-10-CM | POA: Diagnosis not present

## 2020-04-17 DIAGNOSIS — Z6830 Body mass index (BMI) 30.0-30.9, adult: Secondary | ICD-10-CM | POA: Diagnosis not present

## 2020-04-17 DIAGNOSIS — M19049 Primary osteoarthritis, unspecified hand: Secondary | ICD-10-CM | POA: Diagnosis not present

## 2020-04-17 DIAGNOSIS — E1165 Type 2 diabetes mellitus with hyperglycemia: Secondary | ICD-10-CM | POA: Diagnosis not present

## 2020-04-17 DIAGNOSIS — Z299 Encounter for prophylactic measures, unspecified: Secondary | ICD-10-CM | POA: Diagnosis not present

## 2020-04-17 DIAGNOSIS — R5383 Other fatigue: Secondary | ICD-10-CM | POA: Diagnosis not present

## 2020-05-01 DIAGNOSIS — R69 Illness, unspecified: Secondary | ICD-10-CM | POA: Diagnosis not present

## 2020-05-08 DIAGNOSIS — R69 Illness, unspecified: Secondary | ICD-10-CM | POA: Diagnosis not present

## 2020-05-10 DIAGNOSIS — R69 Illness, unspecified: Secondary | ICD-10-CM | POA: Diagnosis not present

## 2020-05-15 DIAGNOSIS — E538 Deficiency of other specified B group vitamins: Secondary | ICD-10-CM | POA: Diagnosis not present

## 2020-05-15 DIAGNOSIS — Z299 Encounter for prophylactic measures, unspecified: Secondary | ICD-10-CM | POA: Diagnosis not present

## 2020-05-15 DIAGNOSIS — I10 Essential (primary) hypertension: Secondary | ICD-10-CM | POA: Diagnosis not present

## 2020-05-15 DIAGNOSIS — Z6831 Body mass index (BMI) 31.0-31.9, adult: Secondary | ICD-10-CM | POA: Diagnosis not present

## 2020-05-15 DIAGNOSIS — M19049 Primary osteoarthritis, unspecified hand: Secondary | ICD-10-CM | POA: Diagnosis not present

## 2020-05-15 DIAGNOSIS — E1165 Type 2 diabetes mellitus with hyperglycemia: Secondary | ICD-10-CM | POA: Diagnosis not present

## 2020-05-18 DIAGNOSIS — R69 Illness, unspecified: Secondary | ICD-10-CM | POA: Diagnosis not present

## 2020-06-15 DIAGNOSIS — E538 Deficiency of other specified B group vitamins: Secondary | ICD-10-CM | POA: Diagnosis not present

## 2020-06-15 DIAGNOSIS — M19049 Primary osteoarthritis, unspecified hand: Secondary | ICD-10-CM | POA: Diagnosis not present

## 2020-06-15 DIAGNOSIS — Z299 Encounter for prophylactic measures, unspecified: Secondary | ICD-10-CM | POA: Diagnosis not present

## 2020-06-15 DIAGNOSIS — Z6832 Body mass index (BMI) 32.0-32.9, adult: Secondary | ICD-10-CM | POA: Diagnosis not present

## 2020-06-15 DIAGNOSIS — I10 Essential (primary) hypertension: Secondary | ICD-10-CM | POA: Diagnosis not present

## 2020-06-15 DIAGNOSIS — E1165 Type 2 diabetes mellitus with hyperglycemia: Secondary | ICD-10-CM | POA: Diagnosis not present

## 2020-06-27 DIAGNOSIS — E1159 Type 2 diabetes mellitus with other circulatory complications: Secondary | ICD-10-CM | POA: Diagnosis not present

## 2020-06-27 DIAGNOSIS — M48062 Spinal stenosis, lumbar region with neurogenic claudication: Secondary | ICD-10-CM | POA: Diagnosis not present

## 2020-06-27 DIAGNOSIS — I10 Essential (primary) hypertension: Secondary | ICD-10-CM | POA: Diagnosis not present

## 2020-06-27 DIAGNOSIS — Z6829 Body mass index (BMI) 29.0-29.9, adult: Secondary | ICD-10-CM | POA: Diagnosis not present

## 2020-06-27 DIAGNOSIS — E1143 Type 2 diabetes mellitus with diabetic autonomic (poly)neuropathy: Secondary | ICD-10-CM | POA: Diagnosis not present

## 2020-07-17 DIAGNOSIS — Z299 Encounter for prophylactic measures, unspecified: Secondary | ICD-10-CM | POA: Diagnosis not present

## 2020-07-17 DIAGNOSIS — E1165 Type 2 diabetes mellitus with hyperglycemia: Secondary | ICD-10-CM | POA: Diagnosis not present

## 2020-07-17 DIAGNOSIS — E1142 Type 2 diabetes mellitus with diabetic polyneuropathy: Secondary | ICD-10-CM | POA: Diagnosis not present

## 2020-07-17 DIAGNOSIS — E538 Deficiency of other specified B group vitamins: Secondary | ICD-10-CM | POA: Diagnosis not present

## 2020-07-17 DIAGNOSIS — I10 Essential (primary) hypertension: Secondary | ICD-10-CM | POA: Diagnosis not present

## 2020-07-17 DIAGNOSIS — Z6832 Body mass index (BMI) 32.0-32.9, adult: Secondary | ICD-10-CM | POA: Diagnosis not present

## 2020-08-15 DIAGNOSIS — E538 Deficiency of other specified B group vitamins: Secondary | ICD-10-CM | POA: Diagnosis not present

## 2020-08-15 DIAGNOSIS — E1165 Type 2 diabetes mellitus with hyperglycemia: Secondary | ICD-10-CM | POA: Diagnosis not present

## 2020-08-15 DIAGNOSIS — Z299 Encounter for prophylactic measures, unspecified: Secondary | ICD-10-CM | POA: Diagnosis not present

## 2020-08-15 DIAGNOSIS — Z87891 Personal history of nicotine dependence: Secondary | ICD-10-CM | POA: Diagnosis not present

## 2020-08-15 DIAGNOSIS — E278 Other specified disorders of adrenal gland: Secondary | ICD-10-CM | POA: Diagnosis not present

## 2020-08-15 DIAGNOSIS — Z6831 Body mass index (BMI) 31.0-31.9, adult: Secondary | ICD-10-CM | POA: Diagnosis not present

## 2020-08-15 DIAGNOSIS — I10 Essential (primary) hypertension: Secondary | ICD-10-CM | POA: Diagnosis not present

## 2020-09-14 DIAGNOSIS — I10 Essential (primary) hypertension: Secondary | ICD-10-CM | POA: Diagnosis not present

## 2020-09-14 DIAGNOSIS — M545 Low back pain, unspecified: Secondary | ICD-10-CM | POA: Diagnosis not present

## 2020-09-14 DIAGNOSIS — E1165 Type 2 diabetes mellitus with hyperglycemia: Secondary | ICD-10-CM | POA: Diagnosis not present

## 2020-09-14 DIAGNOSIS — Z6832 Body mass index (BMI) 32.0-32.9, adult: Secondary | ICD-10-CM | POA: Diagnosis not present

## 2020-09-14 DIAGNOSIS — Z87891 Personal history of nicotine dependence: Secondary | ICD-10-CM | POA: Diagnosis not present

## 2020-09-14 DIAGNOSIS — G8929 Other chronic pain: Secondary | ICD-10-CM | POA: Diagnosis not present

## 2020-09-14 DIAGNOSIS — E538 Deficiency of other specified B group vitamins: Secondary | ICD-10-CM | POA: Diagnosis not present

## 2020-09-14 DIAGNOSIS — Z299 Encounter for prophylactic measures, unspecified: Secondary | ICD-10-CM | POA: Diagnosis not present

## 2020-10-03 DIAGNOSIS — E1165 Type 2 diabetes mellitus with hyperglycemia: Secondary | ICD-10-CM | POA: Diagnosis not present

## 2020-10-03 DIAGNOSIS — E278 Other specified disorders of adrenal gland: Secondary | ICD-10-CM | POA: Diagnosis not present

## 2020-10-03 DIAGNOSIS — E538 Deficiency of other specified B group vitamins: Secondary | ICD-10-CM | POA: Diagnosis not present

## 2020-10-03 DIAGNOSIS — Z6832 Body mass index (BMI) 32.0-32.9, adult: Secondary | ICD-10-CM | POA: Diagnosis not present

## 2020-10-03 DIAGNOSIS — I10 Essential (primary) hypertension: Secondary | ICD-10-CM | POA: Diagnosis not present

## 2020-10-03 DIAGNOSIS — Z299 Encounter for prophylactic measures, unspecified: Secondary | ICD-10-CM | POA: Diagnosis not present

## 2020-10-05 DIAGNOSIS — Z7984 Long term (current) use of oral hypoglycemic drugs: Secondary | ICD-10-CM | POA: Diagnosis not present

## 2020-10-05 DIAGNOSIS — Z794 Long term (current) use of insulin: Secondary | ICD-10-CM | POA: Diagnosis not present

## 2020-10-05 DIAGNOSIS — H5203 Hypermetropia, bilateral: Secondary | ICD-10-CM | POA: Diagnosis not present

## 2020-10-05 DIAGNOSIS — H524 Presbyopia: Secondary | ICD-10-CM | POA: Diagnosis not present

## 2020-10-05 DIAGNOSIS — E119 Type 2 diabetes mellitus without complications: Secondary | ICD-10-CM | POA: Diagnosis not present

## 2020-10-05 DIAGNOSIS — H52223 Regular astigmatism, bilateral: Secondary | ICD-10-CM | POA: Diagnosis not present

## 2020-10-05 DIAGNOSIS — H25813 Combined forms of age-related cataract, bilateral: Secondary | ICD-10-CM | POA: Diagnosis not present

## 2020-10-06 DIAGNOSIS — Z0100 Encounter for examination of eyes and vision without abnormal findings: Secondary | ICD-10-CM | POA: Diagnosis not present

## 2020-10-24 DIAGNOSIS — M48062 Spinal stenosis, lumbar region with neurogenic claudication: Secondary | ICD-10-CM | POA: Diagnosis not present

## 2020-10-24 DIAGNOSIS — Z6829 Body mass index (BMI) 29.0-29.9, adult: Secondary | ICD-10-CM | POA: Diagnosis not present

## 2020-10-24 DIAGNOSIS — R030 Elevated blood-pressure reading, without diagnosis of hypertension: Secondary | ICD-10-CM | POA: Diagnosis not present

## 2020-11-03 DIAGNOSIS — Z299 Encounter for prophylactic measures, unspecified: Secondary | ICD-10-CM | POA: Diagnosis not present

## 2020-11-03 DIAGNOSIS — E538 Deficiency of other specified B group vitamins: Secondary | ICD-10-CM | POA: Diagnosis not present

## 2020-11-03 DIAGNOSIS — Z79899 Other long term (current) drug therapy: Secondary | ICD-10-CM | POA: Diagnosis not present

## 2020-11-03 DIAGNOSIS — R69 Illness, unspecified: Secondary | ICD-10-CM | POA: Diagnosis not present

## 2020-11-03 DIAGNOSIS — E1165 Type 2 diabetes mellitus with hyperglycemia: Secondary | ICD-10-CM | POA: Diagnosis not present

## 2020-11-03 DIAGNOSIS — Z6832 Body mass index (BMI) 32.0-32.9, adult: Secondary | ICD-10-CM | POA: Diagnosis not present

## 2020-12-01 DIAGNOSIS — R69 Illness, unspecified: Secondary | ICD-10-CM | POA: Diagnosis not present

## 2020-12-01 DIAGNOSIS — E1165 Type 2 diabetes mellitus with hyperglycemia: Secondary | ICD-10-CM | POA: Diagnosis not present

## 2020-12-01 DIAGNOSIS — U071 COVID-19: Secondary | ICD-10-CM | POA: Diagnosis not present

## 2020-12-01 DIAGNOSIS — Z299 Encounter for prophylactic measures, unspecified: Secondary | ICD-10-CM | POA: Diagnosis not present

## 2020-12-01 DIAGNOSIS — Z87891 Personal history of nicotine dependence: Secondary | ICD-10-CM | POA: Diagnosis not present

## 2020-12-01 DIAGNOSIS — I10 Essential (primary) hypertension: Secondary | ICD-10-CM | POA: Diagnosis not present

## 2020-12-01 DIAGNOSIS — Z6832 Body mass index (BMI) 32.0-32.9, adult: Secondary | ICD-10-CM | POA: Diagnosis not present

## 2020-12-01 DIAGNOSIS — Z20822 Contact with and (suspected) exposure to covid-19: Secondary | ICD-10-CM | POA: Diagnosis not present

## 2020-12-13 DIAGNOSIS — E1165 Type 2 diabetes mellitus with hyperglycemia: Secondary | ICD-10-CM | POA: Diagnosis not present

## 2020-12-13 DIAGNOSIS — R52 Pain, unspecified: Secondary | ICD-10-CM | POA: Diagnosis not present

## 2020-12-13 DIAGNOSIS — Z299 Encounter for prophylactic measures, unspecified: Secondary | ICD-10-CM | POA: Diagnosis not present

## 2020-12-13 DIAGNOSIS — M545 Low back pain, unspecified: Secondary | ICD-10-CM | POA: Diagnosis not present

## 2020-12-13 DIAGNOSIS — I10 Essential (primary) hypertension: Secondary | ICD-10-CM | POA: Diagnosis not present

## 2020-12-13 DIAGNOSIS — Z6832 Body mass index (BMI) 32.0-32.9, adult: Secondary | ICD-10-CM | POA: Diagnosis not present

## 2020-12-13 DIAGNOSIS — E538 Deficiency of other specified B group vitamins: Secondary | ICD-10-CM | POA: Diagnosis not present

## 2021-01-04 DIAGNOSIS — E1140 Type 2 diabetes mellitus with diabetic neuropathy, unspecified: Secondary | ICD-10-CM | POA: Diagnosis not present

## 2021-01-04 DIAGNOSIS — L84 Corns and callosities: Secondary | ICD-10-CM | POA: Diagnosis not present

## 2021-01-04 DIAGNOSIS — E109 Type 1 diabetes mellitus without complications: Secondary | ICD-10-CM | POA: Diagnosis not present

## 2021-01-15 DIAGNOSIS — Z6832 Body mass index (BMI) 32.0-32.9, adult: Secondary | ICD-10-CM | POA: Diagnosis not present

## 2021-01-15 DIAGNOSIS — G8929 Other chronic pain: Secondary | ICD-10-CM | POA: Diagnosis not present

## 2021-01-15 DIAGNOSIS — M545 Low back pain, unspecified: Secondary | ICD-10-CM | POA: Diagnosis not present

## 2021-01-15 DIAGNOSIS — E538 Deficiency of other specified B group vitamins: Secondary | ICD-10-CM | POA: Diagnosis not present

## 2021-01-15 DIAGNOSIS — I10 Essential (primary) hypertension: Secondary | ICD-10-CM | POA: Diagnosis not present

## 2021-01-15 DIAGNOSIS — Z299 Encounter for prophylactic measures, unspecified: Secondary | ICD-10-CM | POA: Diagnosis not present

## 2021-02-12 DIAGNOSIS — G8929 Other chronic pain: Secondary | ICD-10-CM | POA: Diagnosis not present

## 2021-02-12 DIAGNOSIS — Z6832 Body mass index (BMI) 32.0-32.9, adult: Secondary | ICD-10-CM | POA: Diagnosis not present

## 2021-02-12 DIAGNOSIS — Z299 Encounter for prophylactic measures, unspecified: Secondary | ICD-10-CM | POA: Diagnosis not present

## 2021-02-12 DIAGNOSIS — I10 Essential (primary) hypertension: Secondary | ICD-10-CM | POA: Diagnosis not present

## 2021-02-12 DIAGNOSIS — M7660 Achilles tendinitis, unspecified leg: Secondary | ICD-10-CM | POA: Diagnosis not present

## 2021-02-12 DIAGNOSIS — E538 Deficiency of other specified B group vitamins: Secondary | ICD-10-CM | POA: Diagnosis not present

## 2021-02-12 DIAGNOSIS — M545 Low back pain, unspecified: Secondary | ICD-10-CM | POA: Diagnosis not present

## 2021-02-14 DIAGNOSIS — Z6830 Body mass index (BMI) 30.0-30.9, adult: Secondary | ICD-10-CM | POA: Diagnosis not present

## 2021-02-14 DIAGNOSIS — M48062 Spinal stenosis, lumbar region with neurogenic claudication: Secondary | ICD-10-CM | POA: Diagnosis not present

## 2021-02-14 DIAGNOSIS — I10 Essential (primary) hypertension: Secondary | ICD-10-CM | POA: Diagnosis not present

## 2021-02-27 IMAGING — MR MR LUMBAR SPINE W/O CM
4 of 5 series · 25 of 48 positions shown · non-contrast
Comparison: Lumbar spine radiographs 11/09/2019

CLINICAL DATA: Low back pain for 3 months. Left leg numbness.

EXAM:
MRI LUMBAR SPINE WITHOUT CONTRAST
TECHNIQUE: Multiplanar, multisequence MR imaging of the lumbar spine was
performed. No intravenous contrast was administered.

[Series 3: T2 · sagittal · 4.0mm · 0.55mm/px · 6 of 17 slices shown (1 of 2)]
[im 1/17]
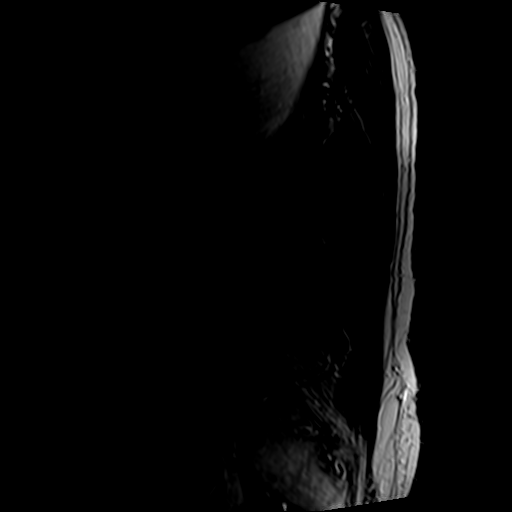
[im 4/17]
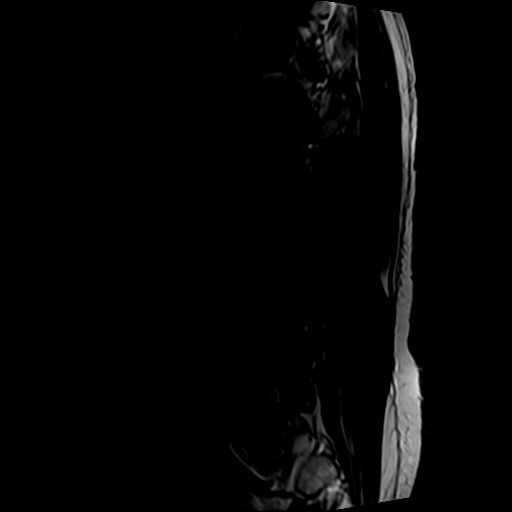
[im 7/17]
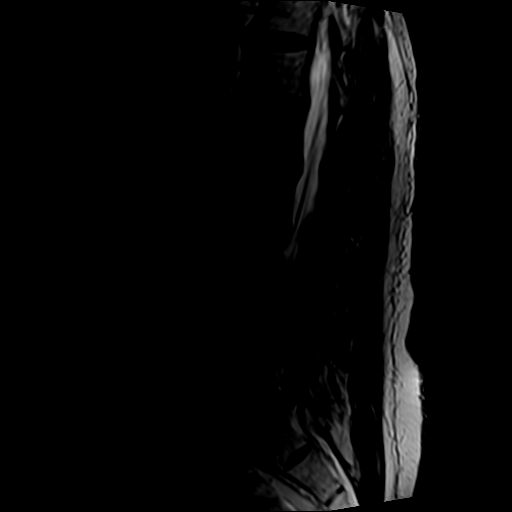
[im 10/17]
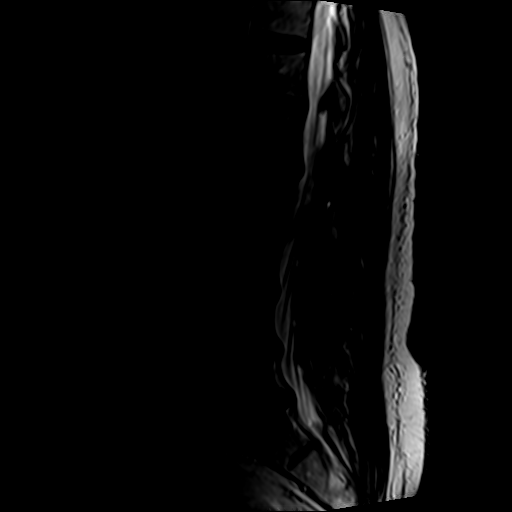
[im 13/17]
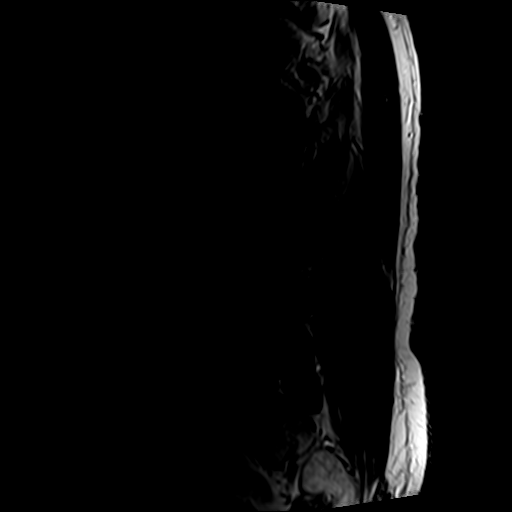
[im 17/17]
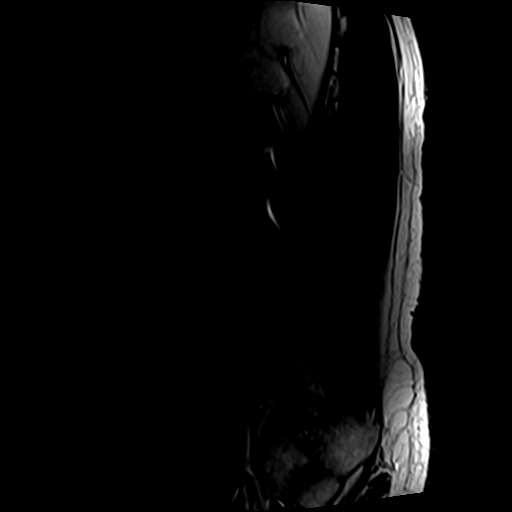

[Series 4: T1 · sagittal · 4.0mm · 0.55mm/px · 6 of 17 slices shown (1 of 2)]
[im 1/17]
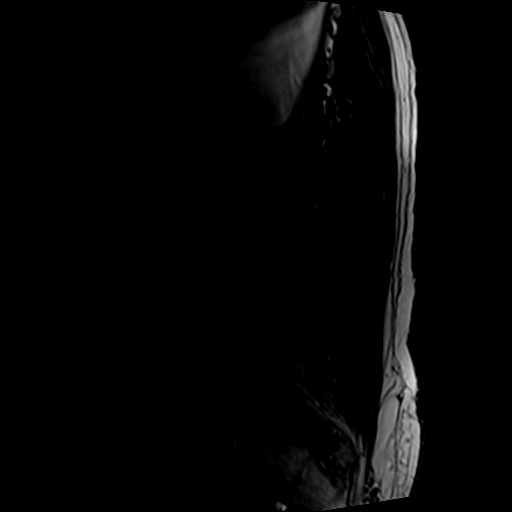
[im 4/17]
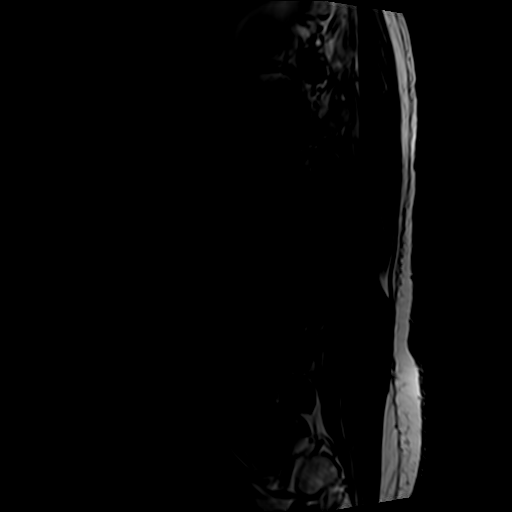
[im 7/17]
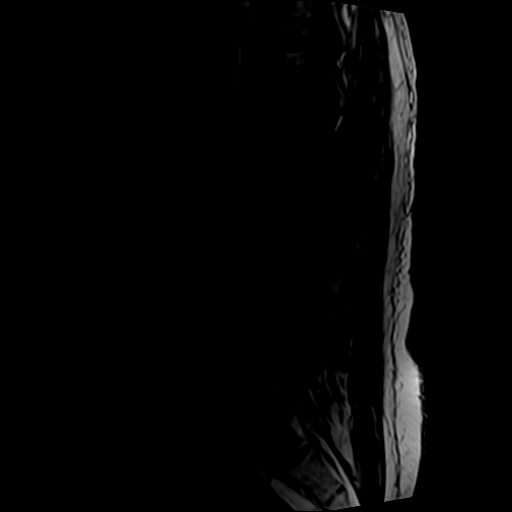
[im 10/17]
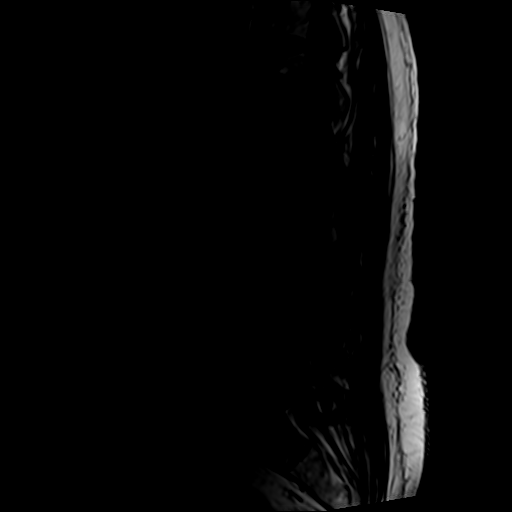
[im 13/17]
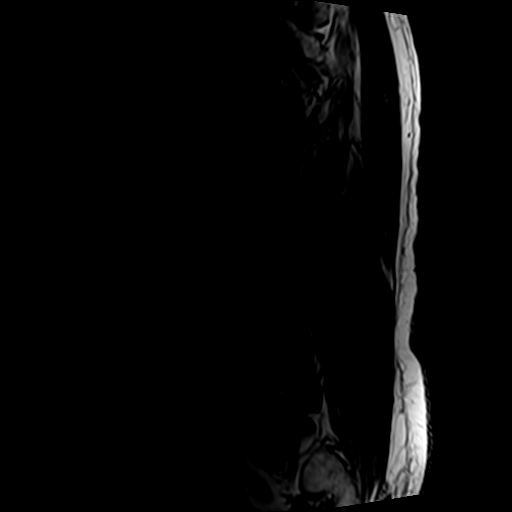
[im 17/17]
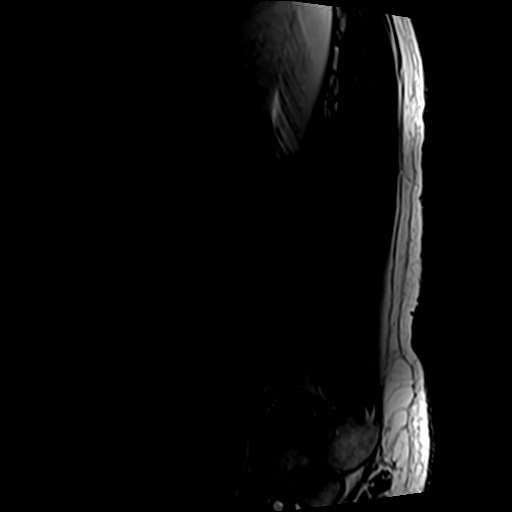

[Series 6: T2 · axial · 4.0mm · 0.70mm/px · z∈[-106,+118]mm · 9 of 41 slices shown (2 of 2)]
[im 1/41]
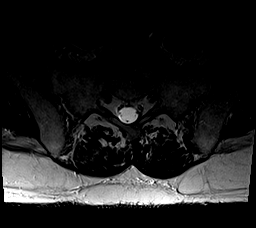
[im 6/41]
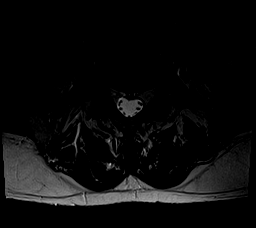
[im 12/41]
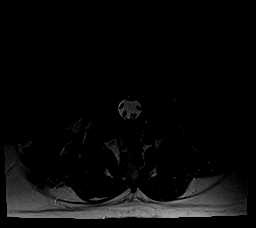
[im 18/41]
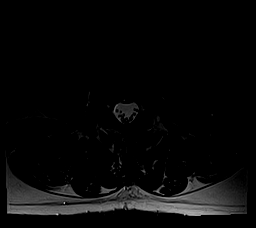
[im 21/41]
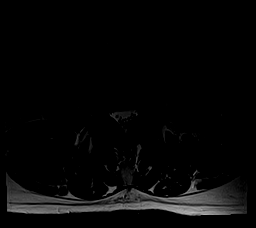
[im 23/41]
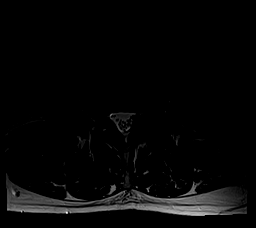
[im 29/41]
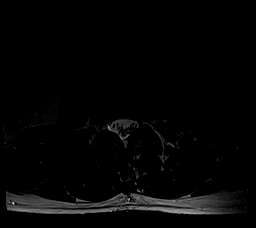
[im 35/41]
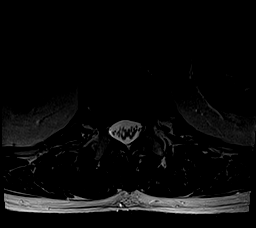
[im 41/41]
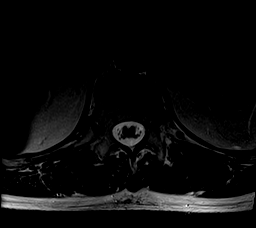

[Series 7: T1 · axial · 4.0mm · 0.35mm/px · z∈[-106,+88]mm · 4 of 41 slices shown (2 of 2)]
[im 1/41]
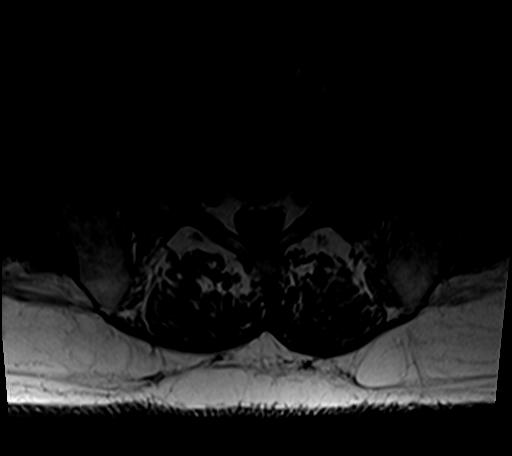
[im 6/41]
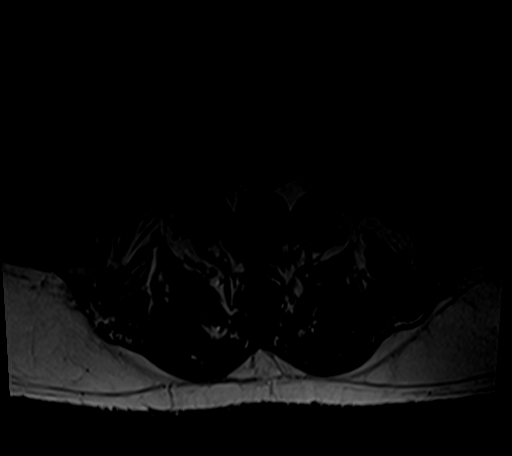
[im 21/41]
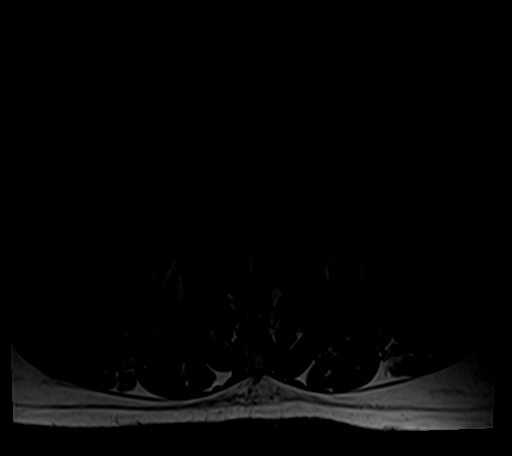
[im 35/41]
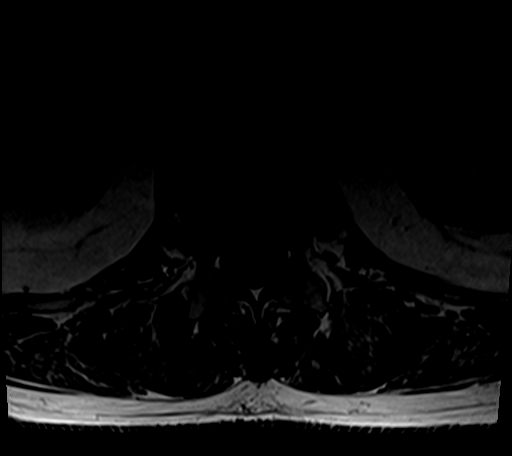

[25 of 48 positions shown; findings below may reference images not displayed]

FINDINGS: Segmentation:  Standard.

Alignment: Trace retrolisthesis of L2 on L3 and L3 on L4. Slight
right convex curvature of the lumbar spine.

Vertebrae: No fracture, suspicious osseous lesion, or significant
marrow edema. Chronic degenerative endplate changes and small
Schmorl's nodes at L4-5.

Conus medullaris and cauda equina: Conus extends to the upper L1
level. Conus and cauda equina appear normal.

Paraspinal and other soft tissues: Unremarkable.

Disc levels:

Disc desiccation throughout the lumbar spine. Moderate disc space
narrowing at L4-5 greater than L5-S1. Mild disc space narrowing at
L1-2 and L2-3.

T12-L1: Normal disc. Asymmetric left facet spurring without
stenosis.

L1-2: Mild disc bulging and mild right and mild-to-moderate left
facet hypertrophy without stenosis.

L2-3: Circumferential disc bulging and moderate left greater than
right facet hypertrophy result in mild spinal stenosis, mild right
and mild-to-moderate left lateral recess stenosis, and
mild-to-moderate bilateral neural foraminal stenosis.

L3-4: Circumferential disc bulging and moderate facet hypertrophy
result in moderate right and mild-to-moderate left lateral recess
stenosis and moderate bilateral neural foraminal stenosis without
significant spinal stenosis.

L4-5: Circumferential disc bulging, a right central disc protrusion,
and moderate facet and ligamentum flavum hypertrophy result in mild
spinal stenosis, moderate to severe right and mild left lateral
recess stenosis, and moderate to severe right and mild left neural
foraminal stenosis. Potential right L4 and right L5 nerve root
impingement.

L5-S1: Circumferential disc bulging slightly greater to the left
results in mild bilateral neural foraminal stenosis. There is a
broad central disc protrusion which contacts but does not
significantly displace the S1 nerve roots. No spinal stenosis.
IMPRESSION: 1. Multilevel lumbar disc and facet degeneration, most notable at
L4-5 where there is moderate to severe right lateral recess and
right neural foraminal stenosis.
2. Mild-to-moderate lateral recess and up to moderate neural
foraminal stenosis at L2-3 and L3-4.

## 2021-03-15 ENCOUNTER — Other Ambulatory Visit: Payer: Self-pay | Admitting: Internal Medicine

## 2021-03-15 ENCOUNTER — Other Ambulatory Visit (HOSPITAL_COMMUNITY): Payer: Self-pay | Admitting: Internal Medicine

## 2021-03-15 DIAGNOSIS — Z6831 Body mass index (BMI) 31.0-31.9, adult: Secondary | ICD-10-CM | POA: Diagnosis not present

## 2021-03-15 DIAGNOSIS — M79661 Pain in right lower leg: Secondary | ICD-10-CM | POA: Diagnosis not present

## 2021-03-15 DIAGNOSIS — I10 Essential (primary) hypertension: Secondary | ICD-10-CM | POA: Diagnosis not present

## 2021-03-15 DIAGNOSIS — Z299 Encounter for prophylactic measures, unspecified: Secondary | ICD-10-CM | POA: Diagnosis not present

## 2021-03-15 DIAGNOSIS — E538 Deficiency of other specified B group vitamins: Secondary | ICD-10-CM | POA: Diagnosis not present

## 2021-03-15 DIAGNOSIS — M79604 Pain in right leg: Secondary | ICD-10-CM

## 2021-03-15 DIAGNOSIS — E1165 Type 2 diabetes mellitus with hyperglycemia: Secondary | ICD-10-CM | POA: Diagnosis not present

## 2021-03-16 ENCOUNTER — Ambulatory Visit (HOSPITAL_COMMUNITY)
Admission: RE | Admit: 2021-03-16 | Discharge: 2021-03-16 | Disposition: A | Discharge status: Home / Self Care | Payer: Medicare HMO | Source: Ambulatory Visit | Attending: Internal Medicine | Admitting: Internal Medicine

## 2021-03-16 ENCOUNTER — Other Ambulatory Visit: Payer: Self-pay

## 2021-03-16 DIAGNOSIS — M79604 Pain in right leg: Secondary | ICD-10-CM | POA: Diagnosis not present

## 2021-03-16 DIAGNOSIS — Z299 Encounter for prophylactic measures, unspecified: Secondary | ICD-10-CM | POA: Diagnosis not present

## 2021-03-16 DIAGNOSIS — Z713 Dietary counseling and surveillance: Secondary | ICD-10-CM | POA: Diagnosis not present

## 2021-03-16 DIAGNOSIS — I82409 Acute embolism and thrombosis of unspecified deep veins of unspecified lower extremity: Secondary | ICD-10-CM | POA: Diagnosis not present

## 2021-03-16 DIAGNOSIS — Z6831 Body mass index (BMI) 31.0-31.9, adult: Secondary | ICD-10-CM | POA: Diagnosis not present

## 2021-03-16 DIAGNOSIS — I10 Essential (primary) hypertension: Secondary | ICD-10-CM | POA: Diagnosis not present

## 2021-03-16 DIAGNOSIS — I82491 Acute embolism and thrombosis of other specified deep vein of right lower extremity: Secondary | ICD-10-CM | POA: Diagnosis not present

## 2021-03-20 DIAGNOSIS — Z125 Encounter for screening for malignant neoplasm of prostate: Secondary | ICD-10-CM | POA: Diagnosis not present

## 2021-03-20 DIAGNOSIS — Z299 Encounter for prophylactic measures, unspecified: Secondary | ICD-10-CM | POA: Diagnosis not present

## 2021-03-20 DIAGNOSIS — R5383 Other fatigue: Secondary | ICD-10-CM | POA: Diagnosis not present

## 2021-03-20 DIAGNOSIS — Z6831 Body mass index (BMI) 31.0-31.9, adult: Secondary | ICD-10-CM | POA: Diagnosis not present

## 2021-03-20 DIAGNOSIS — Z0000 Encounter for general adult medical examination without abnormal findings: Secondary | ICD-10-CM | POA: Diagnosis not present

## 2021-03-20 DIAGNOSIS — Z1331 Encounter for screening for depression: Secondary | ICD-10-CM | POA: Diagnosis not present

## 2021-03-20 DIAGNOSIS — E7800 Pure hypercholesterolemia, unspecified: Secondary | ICD-10-CM | POA: Diagnosis not present

## 2021-03-20 DIAGNOSIS — Z1339 Encounter for screening examination for other mental health and behavioral disorders: Secondary | ICD-10-CM | POA: Diagnosis not present

## 2021-03-20 DIAGNOSIS — Z79899 Other long term (current) drug therapy: Secondary | ICD-10-CM | POA: Diagnosis not present

## 2021-03-20 DIAGNOSIS — I10 Essential (primary) hypertension: Secondary | ICD-10-CM | POA: Diagnosis not present

## 2021-03-20 DIAGNOSIS — Z7189 Other specified counseling: Secondary | ICD-10-CM | POA: Diagnosis not present

## 2021-04-16 DIAGNOSIS — Z299 Encounter for prophylactic measures, unspecified: Secondary | ICD-10-CM | POA: Diagnosis not present

## 2021-04-16 DIAGNOSIS — E538 Deficiency of other specified B group vitamins: Secondary | ICD-10-CM | POA: Diagnosis not present

## 2021-04-16 DIAGNOSIS — Z23 Encounter for immunization: Secondary | ICD-10-CM | POA: Diagnosis not present

## 2021-04-16 DIAGNOSIS — I10 Essential (primary) hypertension: Secondary | ICD-10-CM | POA: Diagnosis not present

## 2021-04-16 DIAGNOSIS — R52 Pain, unspecified: Secondary | ICD-10-CM | POA: Diagnosis not present

## 2021-04-16 DIAGNOSIS — Z6831 Body mass index (BMI) 31.0-31.9, adult: Secondary | ICD-10-CM | POA: Diagnosis not present

## 2021-04-16 DIAGNOSIS — M19049 Primary osteoarthritis, unspecified hand: Secondary | ICD-10-CM | POA: Diagnosis not present

## 2021-04-16 DIAGNOSIS — M1612 Unilateral primary osteoarthritis, left hip: Secondary | ICD-10-CM | POA: Diagnosis not present

## 2021-04-16 DIAGNOSIS — I82409 Acute embolism and thrombosis of unspecified deep veins of unspecified lower extremity: Secondary | ICD-10-CM | POA: Diagnosis not present

## 2021-04-16 DIAGNOSIS — M25552 Pain in left hip: Secondary | ICD-10-CM | POA: Diagnosis not present

## 2021-04-25 ENCOUNTER — Encounter: Payer: Self-pay | Admitting: Orthopaedic Surgery

## 2021-04-25 ENCOUNTER — Ambulatory Visit: Payer: Medicare HMO | Admitting: Orthopaedic Surgery

## 2021-04-25 VITALS — BP 141/79 | HR 63 | Ht 71.0 in | Wt 220.1 lb

## 2021-04-25 DIAGNOSIS — M25552 Pain in left hip: Secondary | ICD-10-CM | POA: Diagnosis not present

## 2021-04-25 NOTE — Progress Notes (Signed)
Subjective:    Patient ID: Jim Ramirez, male    DOB: 1950-08-21, 70 y.o.   MRN: [id number]  HPI He has had pain in the left hip for about a month getting worse at times.  About six or seven weeks ago he had an injury to the right leg and then developed a blood clot.  He is now on Eliquis.  He is an avid walker, walking three miles in 39 minutes almost every day.  He is a "power walker" and he cannot do this for the last few weeks.  He has several young men he walks with and is upset he cannot do this now.  He has no trauma to the left hip.  He has pain in the groin area and to the left knee.  He has tried Tylenol and ice.  He has seen Dr. Clelia Croft his family doctor about this.  He is a diabetic and has elevation of his blood sugar often and has to take extra insulin at times.  He can walk around the house and do things OK but he cannot walk much as he wants to do.  He has had a few days when the hip was hurting when he awakened.  He cannot take NSAIDs because of the Eliquis.  He wants to know if he needs a total hip.  I have independently reviewed and interpreted x-rays of this patient done at another site by another physician or qualified health professional.  I have told him he most likely overdid it when he walked after he was allowed to do so after the blood clot.  He has put extra weight on the left hip area.  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  He is not a candidate for prednisone as his blood sugars vary so much and that will make it worse.  I have suggested he modify his walking to more of a "regular, slower walk" and walk shorter distances or by time.  He will consider this.  He is not a candidate for a total hip at this time.  Even if he was, the recent blood clot will prevent surgery for a while.   Review of Systems  Constitutional:  Positive for activity change.  Musculoskeletal:  Positive for arthralgias and gait problem.  All other systems  reviewed and are negative. For Review of Systems, all other systems reviewed and are negative.  The following is a summary of the past history medically, past history surgically, known current medicines, social history and family history.  This information is gathered electronically by the computer from prior information and documentation.  I review this each visit and have found including this information at this point in the chart is beneficial and informative.   No past medical history on file.    Current Outpatient Medications on File Prior to Visit  Medication Sig Dispense Refill   apixaban (ELIQUIS) 5 MG TABS tablet Take 5 mg by mouth 2 (two) times daily.     azelastine (OPTIVAR) 0.05 % ophthalmic solution 1 drop 2 (two) times daily.     betamethasone dipropionate 0.05 % cream Apply topically 2 (two) times daily.     empagliflozin (JARDIANCE) 25 MG TABS tablet Take by mouth daily.     escitalopram (LEXAPRO) 10 MG tablet Take 10 mg by mouth daily.     esomeprazole (NEXIUM) 40 MG capsule Take 40 mg by mouth daily at 12 noon.  hydrochlorothiazide (HYDRODIURIL) 25 MG tablet Take 25 mg by mouth daily.     insulin NPH-regular Human (70-30) 100 UNIT/ML injection Inject 30 Units into the skin.     levocetirizine (XYZAL) 5 MG tablet Take 5 mg by mouth every evening.     meloxicam (MOBIC) 15 MG tablet Take 15 mg by mouth daily.     metFORMIN (GLUCOPHAGE) 1000 MG tablet Take 1,000 mg by mouth 2 (two) times daily with a meal.     metoprolol tartrate (LOPRESSOR) 50 MG tablet Take 50 mg by mouth 2 (two) times daily.     mometasone-formoterol (DULERA) 100-5 MCG/ACT AERO Inhale 2 puffs into the lungs 2 (two) times daily.     tamsulosin (FLOMAX) 0.4 MG CAPS capsule Take 0.4 mg by mouth.     trandolapril (MAVIK) 2 MG tablet Take 2 mg by mouth daily.     No current facility-administered medications on file prior to visit.    Social History   Socioeconomic History   Marital status: Divorced     Spouse name: Not on file   Number of children: Not on file   Years of education: Not on file   Highest education level: Not on file  Occupational History   Not on file  Tobacco Use   Smoking status: Never    Passive exposure: Never   Smokeless tobacco: Never  Substance and Sexual Activity   Alcohol use: Not on file   Drug use: Not on file   Sexual activity: Not on file  Other Topics Concern   Not on file  Social History Narrative   Not on file   Social Determinants of Health   Financial Resource Strain: Not on file  Food Insecurity: Not on file  Transportation Needs: Not on file  Physical Activity: Not on file  Stress: Not on file  Social Connections: Not on file  Intimate Partner Violence: Not on file    No family history on file.  BP (!) 141/79   Pulse 63   Ht 5\' 11"  (1.803 m)   Wt 220 lb 2 oz (99.8 kg)   BMI 30.70 kg/m   Body mass index is 30.7 kg/m.     Objective:   Physical Exam Vitals and nursing note reviewed. Exam conducted with a chaperone present.  Constitutional:      Appearance: He is well-developed.  HENT:     Head: Normocephalic and atraumatic.  Eyes:     Conjunctiva/sclera: Conjunctivae normal.     Pupils: Pupils are equal, round, and reactive to light.  Cardiovascular:     Rate and Rhythm: Normal rate and regular rhythm.  Pulmonary:     Effort: Pulmonary effort is normal.  Abdominal:     Palpations: Abdomen is soft.  Musculoskeletal:     Cervical back: Normal range of motion and neck supple.       Legs:  Skin:    General: Skin is warm and dry.  Neurological:     Mental Status: He is alert and oriented to person, place, and time.     Cranial Nerves: No cranial nerve deficit.     Motor: No abnormal muscle tone.     Coordination: Coordination normal.     Deep Tendon Reflexes: Reflexes are normal and symmetric. Reflexes normal.  Psychiatric:        Behavior: Behavior normal.        Thought Content: Thought content normal.         Judgment: Judgment normal.  Assessment & Plan:   Encounter Diagnosis  Name Primary?   Left hip pain Yes   I have answered his questions.  I can see him after he is off the Eliquis.  I will see as needed.  Call if any problem.  Precautions discussed.  Electronically Signed Darreld Mclean, MD 9/28/202210:46 AM

## 2021-04-26 DIAGNOSIS — Z23 Encounter for immunization: Secondary | ICD-10-CM | POA: Diagnosis not present

## 2021-05-16 DIAGNOSIS — Z6831 Body mass index (BMI) 31.0-31.9, adult: Secondary | ICD-10-CM | POA: Diagnosis not present

## 2021-05-16 DIAGNOSIS — E538 Deficiency of other specified B group vitamins: Secondary | ICD-10-CM | POA: Diagnosis not present

## 2021-05-16 DIAGNOSIS — I10 Essential (primary) hypertension: Secondary | ICD-10-CM | POA: Diagnosis not present

## 2021-05-16 DIAGNOSIS — R0602 Shortness of breath: Secondary | ICD-10-CM | POA: Diagnosis not present

## 2021-05-16 DIAGNOSIS — R059 Cough, unspecified: Secondary | ICD-10-CM | POA: Diagnosis not present

## 2021-05-16 DIAGNOSIS — Z299 Encounter for prophylactic measures, unspecified: Secondary | ICD-10-CM | POA: Diagnosis not present

## 2021-05-16 DIAGNOSIS — E7800 Pure hypercholesterolemia, unspecified: Secondary | ICD-10-CM | POA: Diagnosis not present

## 2021-06-14 DIAGNOSIS — Z299 Encounter for prophylactic measures, unspecified: Secondary | ICD-10-CM | POA: Diagnosis not present

## 2021-06-14 DIAGNOSIS — M19049 Primary osteoarthritis, unspecified hand: Secondary | ICD-10-CM | POA: Diagnosis not present

## 2021-06-14 DIAGNOSIS — E1165 Type 2 diabetes mellitus with hyperglycemia: Secondary | ICD-10-CM | POA: Diagnosis not present

## 2021-06-14 DIAGNOSIS — Z6831 Body mass index (BMI) 31.0-31.9, adult: Secondary | ICD-10-CM | POA: Diagnosis not present

## 2021-06-14 DIAGNOSIS — E538 Deficiency of other specified B group vitamins: Secondary | ICD-10-CM | POA: Diagnosis not present

## 2021-06-14 DIAGNOSIS — I82409 Acute embolism and thrombosis of unspecified deep veins of unspecified lower extremity: Secondary | ICD-10-CM | POA: Diagnosis not present

## 2021-06-14 DIAGNOSIS — I10 Essential (primary) hypertension: Secondary | ICD-10-CM | POA: Diagnosis not present

## 2021-07-16 DIAGNOSIS — R69 Illness, unspecified: Secondary | ICD-10-CM | POA: Diagnosis not present

## 2021-07-16 DIAGNOSIS — E538 Deficiency of other specified B group vitamins: Secondary | ICD-10-CM | POA: Diagnosis not present

## 2021-07-16 DIAGNOSIS — I10 Essential (primary) hypertension: Secondary | ICD-10-CM | POA: Diagnosis not present

## 2021-07-16 DIAGNOSIS — E1165 Type 2 diabetes mellitus with hyperglycemia: Secondary | ICD-10-CM | POA: Diagnosis not present

## 2021-07-16 DIAGNOSIS — Z299 Encounter for prophylactic measures, unspecified: Secondary | ICD-10-CM | POA: Diagnosis not present

## 2021-07-17 DIAGNOSIS — E538 Deficiency of other specified B group vitamins: Secondary | ICD-10-CM | POA: Diagnosis not present

## 2021-07-19 DIAGNOSIS — M47816 Spondylosis without myelopathy or radiculopathy, lumbar region: Secondary | ICD-10-CM | POA: Diagnosis not present

## 2021-07-19 DIAGNOSIS — M48062 Spinal stenosis, lumbar region with neurogenic claudication: Secondary | ICD-10-CM | POA: Diagnosis not present

## 2021-07-27 DIAGNOSIS — M47816 Spondylosis without myelopathy or radiculopathy, lumbar region: Secondary | ICD-10-CM | POA: Diagnosis not present

## 2021-08-16 DIAGNOSIS — E538 Deficiency of other specified B group vitamins: Secondary | ICD-10-CM | POA: Diagnosis not present

## 2021-08-16 DIAGNOSIS — M1710 Unilateral primary osteoarthritis, unspecified knee: Secondary | ICD-10-CM | POA: Diagnosis not present

## 2021-08-16 DIAGNOSIS — M19049 Primary osteoarthritis, unspecified hand: Secondary | ICD-10-CM | POA: Diagnosis not present

## 2021-08-16 DIAGNOSIS — I10 Essential (primary) hypertension: Secondary | ICD-10-CM | POA: Diagnosis not present

## 2021-08-16 DIAGNOSIS — E1165 Type 2 diabetes mellitus with hyperglycemia: Secondary | ICD-10-CM | POA: Diagnosis not present

## 2021-08-16 DIAGNOSIS — Z6832 Body mass index (BMI) 32.0-32.9, adult: Secondary | ICD-10-CM | POA: Diagnosis not present

## 2021-08-16 DIAGNOSIS — Z87891 Personal history of nicotine dependence: Secondary | ICD-10-CM | POA: Diagnosis not present

## 2021-08-16 DIAGNOSIS — Z299 Encounter for prophylactic measures, unspecified: Secondary | ICD-10-CM | POA: Diagnosis not present

## 2021-09-17 DIAGNOSIS — Z6832 Body mass index (BMI) 32.0-32.9, adult: Secondary | ICD-10-CM | POA: Diagnosis not present

## 2021-09-17 DIAGNOSIS — E1165 Type 2 diabetes mellitus with hyperglycemia: Secondary | ICD-10-CM | POA: Diagnosis not present

## 2021-09-17 DIAGNOSIS — Z87891 Personal history of nicotine dependence: Secondary | ICD-10-CM | POA: Diagnosis not present

## 2021-09-17 DIAGNOSIS — J399 Disease of upper respiratory tract, unspecified: Secondary | ICD-10-CM | POA: Diagnosis not present

## 2021-09-17 DIAGNOSIS — Z299 Encounter for prophylactic measures, unspecified: Secondary | ICD-10-CM | POA: Diagnosis not present

## 2021-09-17 DIAGNOSIS — M1710 Unilateral primary osteoarthritis, unspecified knee: Secondary | ICD-10-CM | POA: Diagnosis not present

## 2021-09-17 DIAGNOSIS — R059 Cough, unspecified: Secondary | ICD-10-CM | POA: Diagnosis not present

## 2021-10-16 DIAGNOSIS — E1142 Type 2 diabetes mellitus with diabetic polyneuropathy: Secondary | ICD-10-CM | POA: Diagnosis not present

## 2021-10-16 DIAGNOSIS — E538 Deficiency of other specified B group vitamins: Secondary | ICD-10-CM | POA: Diagnosis not present

## 2021-10-16 DIAGNOSIS — Z87891 Personal history of nicotine dependence: Secondary | ICD-10-CM | POA: Diagnosis not present

## 2021-10-16 DIAGNOSIS — I10 Essential (primary) hypertension: Secondary | ICD-10-CM | POA: Diagnosis not present

## 2021-10-16 DIAGNOSIS — E1165 Type 2 diabetes mellitus with hyperglycemia: Secondary | ICD-10-CM | POA: Diagnosis not present

## 2021-10-16 DIAGNOSIS — Z6831 Body mass index (BMI) 31.0-31.9, adult: Secondary | ICD-10-CM | POA: Diagnosis not present

## 2021-10-16 DIAGNOSIS — Z299 Encounter for prophylactic measures, unspecified: Secondary | ICD-10-CM | POA: Diagnosis not present

## 2021-10-26 DIAGNOSIS — L84 Corns and callosities: Secondary | ICD-10-CM | POA: Diagnosis not present

## 2021-10-26 DIAGNOSIS — E109 Type 1 diabetes mellitus without complications: Secondary | ICD-10-CM | POA: Diagnosis not present

## 2021-10-26 DIAGNOSIS — E1140 Type 2 diabetes mellitus with diabetic neuropathy, unspecified: Secondary | ICD-10-CM | POA: Diagnosis not present

## 2021-10-31 DIAGNOSIS — M47816 Spondylosis without myelopathy or radiculopathy, lumbar region: Secondary | ICD-10-CM | POA: Diagnosis not present

## 2021-11-14 DIAGNOSIS — E538 Deficiency of other specified B group vitamins: Secondary | ICD-10-CM | POA: Diagnosis not present

## 2021-11-14 DIAGNOSIS — Z6831 Body mass index (BMI) 31.0-31.9, adult: Secondary | ICD-10-CM | POA: Diagnosis not present

## 2021-11-14 DIAGNOSIS — Z299 Encounter for prophylactic measures, unspecified: Secondary | ICD-10-CM | POA: Diagnosis not present

## 2021-11-14 DIAGNOSIS — I10 Essential (primary) hypertension: Secondary | ICD-10-CM | POA: Diagnosis not present

## 2021-11-14 DIAGNOSIS — E1165 Type 2 diabetes mellitus with hyperglycemia: Secondary | ICD-10-CM | POA: Diagnosis not present

## 2021-12-07 DIAGNOSIS — G8929 Other chronic pain: Secondary | ICD-10-CM | POA: Diagnosis not present

## 2021-12-07 DIAGNOSIS — Z6831 Body mass index (BMI) 31.0-31.9, adult: Secondary | ICD-10-CM | POA: Diagnosis not present

## 2021-12-07 DIAGNOSIS — Z299 Encounter for prophylactic measures, unspecified: Secondary | ICD-10-CM | POA: Diagnosis not present

## 2021-12-07 DIAGNOSIS — I10 Essential (primary) hypertension: Secondary | ICD-10-CM | POA: Diagnosis not present

## 2021-12-07 DIAGNOSIS — E538 Deficiency of other specified B group vitamins: Secondary | ICD-10-CM | POA: Diagnosis not present

## 2021-12-07 DIAGNOSIS — M545 Low back pain, unspecified: Secondary | ICD-10-CM | POA: Diagnosis not present

## 2022-01-14 DIAGNOSIS — M542 Cervicalgia: Secondary | ICD-10-CM | POA: Diagnosis not present

## 2022-01-14 DIAGNOSIS — Z6831 Body mass index (BMI) 31.0-31.9, adult: Secondary | ICD-10-CM | POA: Diagnosis not present

## 2022-01-14 DIAGNOSIS — E1165 Type 2 diabetes mellitus with hyperglycemia: Secondary | ICD-10-CM | POA: Diagnosis not present

## 2022-01-14 DIAGNOSIS — R1031 Right lower quadrant pain: Secondary | ICD-10-CM | POA: Diagnosis not present

## 2022-01-14 DIAGNOSIS — R109 Unspecified abdominal pain: Secondary | ICD-10-CM | POA: Diagnosis not present

## 2022-01-14 DIAGNOSIS — R52 Pain, unspecified: Secondary | ICD-10-CM | POA: Diagnosis not present

## 2022-01-14 DIAGNOSIS — E538 Deficiency of other specified B group vitamins: Secondary | ICD-10-CM | POA: Diagnosis not present

## 2022-01-14 DIAGNOSIS — Z299 Encounter for prophylactic measures, unspecified: Secondary | ICD-10-CM | POA: Diagnosis not present

## 2022-01-14 DIAGNOSIS — I10 Essential (primary) hypertension: Secondary | ICD-10-CM | POA: Diagnosis not present

## 2022-01-26 ENCOUNTER — Emergency Department (HOSPITAL_COMMUNITY): Payer: Medicare HMO

## 2022-01-26 ENCOUNTER — Emergency Department (HOSPITAL_COMMUNITY)
Admission: EM | Admit: 2022-01-26 | Discharge: 2022-01-26 | Disposition: A | Discharge status: Home / Self Care | Payer: Medicare HMO | Attending: Emergency Medicine | Admitting: Emergency Medicine

## 2022-01-26 ENCOUNTER — Encounter (HOSPITAL_COMMUNITY): Payer: Self-pay | Admitting: Emergency Medicine

## 2022-01-26 DIAGNOSIS — S0990XA Unspecified injury of head, initial encounter: Secondary | ICD-10-CM | POA: Diagnosis not present

## 2022-01-26 DIAGNOSIS — Z79899 Other long term (current) drug therapy: Secondary | ICD-10-CM | POA: Insufficient documentation

## 2022-01-26 DIAGNOSIS — M19012 Primary osteoarthritis, left shoulder: Secondary | ICD-10-CM | POA: Diagnosis not present

## 2022-01-26 DIAGNOSIS — M47812 Spondylosis without myelopathy or radiculopathy, cervical region: Secondary | ICD-10-CM | POA: Diagnosis not present

## 2022-01-26 DIAGNOSIS — Z9889 Other specified postprocedural states: Secondary | ICD-10-CM | POA: Diagnosis not present

## 2022-01-26 DIAGNOSIS — Z794 Long term (current) use of insulin: Secondary | ICD-10-CM | POA: Insufficient documentation

## 2022-01-26 DIAGNOSIS — Z7901 Long term (current) use of anticoagulants: Secondary | ICD-10-CM | POA: Insufficient documentation

## 2022-01-26 DIAGNOSIS — I10 Essential (primary) hypertension: Secondary | ICD-10-CM | POA: Diagnosis not present

## 2022-01-26 DIAGNOSIS — Z7984 Long term (current) use of oral hypoglycemic drugs: Secondary | ICD-10-CM | POA: Insufficient documentation

## 2022-01-26 DIAGNOSIS — J3489 Other specified disorders of nose and nasal sinuses: Secondary | ICD-10-CM | POA: Diagnosis not present

## 2022-01-26 DIAGNOSIS — Y92410 Unspecified street and highway as the place of occurrence of the external cause: Secondary | ICD-10-CM | POA: Insufficient documentation

## 2022-01-26 DIAGNOSIS — R519 Headache, unspecified: Secondary | ICD-10-CM | POA: Diagnosis not present

## 2022-01-26 DIAGNOSIS — M542 Cervicalgia: Secondary | ICD-10-CM | POA: Insufficient documentation

## 2022-01-26 DIAGNOSIS — E119 Type 2 diabetes mellitus without complications: Secondary | ICD-10-CM | POA: Diagnosis not present

## 2022-01-26 DIAGNOSIS — G9389 Other specified disorders of brain: Secondary | ICD-10-CM | POA: Diagnosis not present

## 2022-01-26 DIAGNOSIS — S199XXA Unspecified injury of neck, initial encounter: Secondary | ICD-10-CM | POA: Diagnosis not present

## 2022-01-26 DIAGNOSIS — G238 Other specified degenerative diseases of basal ganglia: Secondary | ICD-10-CM | POA: Diagnosis not present

## 2022-01-26 HISTORY — DX: Depression, unspecified: F32.A

## 2022-01-26 HISTORY — DX: Calculus of kidney: N20.0

## 2022-01-26 HISTORY — DX: Acute embolism and thrombosis of unspecified deep veins of unspecified lower extremity: I82.409

## 2022-01-26 HISTORY — DX: Essential (primary) hypertension: I10

## 2022-01-26 HISTORY — DX: Type 2 diabetes mellitus without complications: E11.9

## 2022-01-26 MED ORDER — METHYLPREDNISOLONE 4 MG PO TBPK: ORAL_TABLET | Freq: Every day | ORAL | 0 refills | Status: AC

## 2022-01-26 MED ORDER — TIZANIDINE HCL 4 MG PO TABS: 4.0000 mg | ORAL_TABLET | Freq: Four times a day (QID) | ORAL | 0 refills | Status: DC | PRN

## 2022-01-26 NOTE — ED Notes (Signed)
Patient transported to CT 
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
## 2022-02-13 DIAGNOSIS — Z6831 Body mass index (BMI) 31.0-31.9, adult: Secondary | ICD-10-CM | POA: Diagnosis not present

## 2022-02-13 DIAGNOSIS — E538 Deficiency of other specified B group vitamins: Secondary | ICD-10-CM | POA: Diagnosis not present

## 2022-02-13 DIAGNOSIS — E1165 Type 2 diabetes mellitus with hyperglycemia: Secondary | ICD-10-CM | POA: Diagnosis not present

## 2022-02-13 DIAGNOSIS — Z299 Encounter for prophylactic measures, unspecified: Secondary | ICD-10-CM | POA: Diagnosis not present

## 2022-02-13 DIAGNOSIS — I10 Essential (primary) hypertension: Secondary | ICD-10-CM | POA: Diagnosis not present

## 2022-03-08 DIAGNOSIS — Z6829 Body mass index (BMI) 29.0-29.9, adult: Secondary | ICD-10-CM | POA: Diagnosis not present

## 2022-03-08 DIAGNOSIS — M542 Cervicalgia: Secondary | ICD-10-CM | POA: Diagnosis not present

## 2022-03-08 DIAGNOSIS — M5412 Radiculopathy, cervical region: Secondary | ICD-10-CM | POA: Diagnosis not present

## 2022-03-11 ENCOUNTER — Other Ambulatory Visit: Payer: Self-pay | Admitting: Orthopedic Surgery

## 2022-03-11 ENCOUNTER — Other Ambulatory Visit: Payer: Self-pay | Admitting: Neurosurgery

## 2022-03-11 DIAGNOSIS — S86011A Strain of right Achilles tendon, initial encounter: Secondary | ICD-10-CM | POA: Diagnosis not present

## 2022-03-11 DIAGNOSIS — M5412 Radiculopathy, cervical region: Secondary | ICD-10-CM

## 2022-03-11 DIAGNOSIS — T148XXA Other injury of unspecified body region, initial encounter: Secondary | ICD-10-CM

## 2022-03-21 DIAGNOSIS — S86011A Strain of right Achilles tendon, initial encounter: Secondary | ICD-10-CM | POA: Diagnosis not present

## 2022-03-21 DIAGNOSIS — Z0000 Encounter for general adult medical examination without abnormal findings: Secondary | ICD-10-CM | POA: Diagnosis not present

## 2022-03-21 DIAGNOSIS — M25671 Stiffness of right ankle, not elsewhere classified: Secondary | ICD-10-CM | POA: Diagnosis not present

## 2022-03-21 DIAGNOSIS — Z1331 Encounter for screening for depression: Secondary | ICD-10-CM | POA: Diagnosis not present

## 2022-03-21 DIAGNOSIS — R69 Illness, unspecified: Secondary | ICD-10-CM | POA: Diagnosis not present

## 2022-03-21 DIAGNOSIS — I10 Essential (primary) hypertension: Secondary | ICD-10-CM | POA: Diagnosis not present

## 2022-03-21 DIAGNOSIS — S86011D Strain of right Achilles tendon, subsequent encounter: Secondary | ICD-10-CM | POA: Diagnosis not present

## 2022-03-21 DIAGNOSIS — R2689 Other abnormalities of gait and mobility: Secondary | ICD-10-CM | POA: Diagnosis not present

## 2022-03-21 DIAGNOSIS — X58XXXD Exposure to other specified factors, subsequent encounter: Secondary | ICD-10-CM | POA: Diagnosis not present

## 2022-03-21 DIAGNOSIS — Z1339 Encounter for screening examination for other mental health and behavioral disorders: Secondary | ICD-10-CM | POA: Diagnosis not present

## 2022-03-21 DIAGNOSIS — E7800 Pure hypercholesterolemia, unspecified: Secondary | ICD-10-CM | POA: Diagnosis not present

## 2022-03-21 DIAGNOSIS — Z6831 Body mass index (BMI) 31.0-31.9, adult: Secondary | ICD-10-CM | POA: Diagnosis not present

## 2022-03-21 DIAGNOSIS — Z299 Encounter for prophylactic measures, unspecified: Secondary | ICD-10-CM | POA: Diagnosis not present

## 2022-03-21 DIAGNOSIS — E538 Deficiency of other specified B group vitamins: Secondary | ICD-10-CM | POA: Diagnosis not present

## 2022-03-21 DIAGNOSIS — Z7189 Other specified counseling: Secondary | ICD-10-CM | POA: Diagnosis not present

## 2022-03-21 DIAGNOSIS — F419 Anxiety disorder, unspecified: Secondary | ICD-10-CM | POA: Diagnosis not present

## 2022-03-21 DIAGNOSIS — M79661 Pain in right lower leg: Secondary | ICD-10-CM | POA: Diagnosis not present

## 2022-03-22 DIAGNOSIS — Z79899 Other long term (current) drug therapy: Secondary | ICD-10-CM | POA: Diagnosis not present

## 2022-03-22 DIAGNOSIS — E7800 Pure hypercholesterolemia, unspecified: Secondary | ICD-10-CM | POA: Diagnosis not present

## 2022-03-22 DIAGNOSIS — Z125 Encounter for screening for malignant neoplasm of prostate: Secondary | ICD-10-CM | POA: Diagnosis not present

## 2022-03-22 DIAGNOSIS — F419 Anxiety disorder, unspecified: Secondary | ICD-10-CM | POA: Diagnosis not present

## 2022-03-22 DIAGNOSIS — R69 Illness, unspecified: Secondary | ICD-10-CM | POA: Diagnosis not present

## 2022-03-22 DIAGNOSIS — Z0000 Encounter for general adult medical examination without abnormal findings: Secondary | ICD-10-CM | POA: Diagnosis not present

## 2022-03-23 ENCOUNTER — Ambulatory Visit
Admission: RE | Admit: 2022-03-23 | Discharge: 2022-03-23 | Disposition: A | Discharge status: Home / Self Care | Payer: Medicare HMO | Source: Ambulatory Visit | Attending: Neurosurgery | Admitting: Neurosurgery

## 2022-03-23 ENCOUNTER — Ambulatory Visit
Admission: RE | Admit: 2022-03-23 | Discharge: 2022-03-23 | Disposition: A | Discharge status: Home / Self Care | Payer: Medicare HMO | Source: Ambulatory Visit | Attending: Orthopedic Surgery | Admitting: Orthopedic Surgery

## 2022-03-23 DIAGNOSIS — T148XXA Other injury of unspecified body region, initial encounter: Secondary | ICD-10-CM

## 2022-03-23 DIAGNOSIS — M25471 Effusion, right ankle: Secondary | ICD-10-CM | POA: Diagnosis not present

## 2022-03-23 DIAGNOSIS — M5412 Radiculopathy, cervical region: Secondary | ICD-10-CM

## 2022-03-23 DIAGNOSIS — M542 Cervicalgia: Secondary | ICD-10-CM | POA: Diagnosis not present

## 2022-03-23 DIAGNOSIS — M19071 Primary osteoarthritis, right ankle and foot: Secondary | ICD-10-CM | POA: Diagnosis not present

## 2022-03-23 DIAGNOSIS — M4802 Spinal stenosis, cervical region: Secondary | ICD-10-CM | POA: Diagnosis not present

## 2022-03-23 DIAGNOSIS — R600 Localized edema: Secondary | ICD-10-CM | POA: Diagnosis not present

## 2022-03-25 DIAGNOSIS — M47816 Spondylosis without myelopathy or radiculopathy, lumbar region: Secondary | ICD-10-CM | POA: Diagnosis not present

## 2022-03-26 DIAGNOSIS — M25671 Stiffness of right ankle, not elsewhere classified: Secondary | ICD-10-CM | POA: Diagnosis not present

## 2022-03-26 DIAGNOSIS — R2689 Other abnormalities of gait and mobility: Secondary | ICD-10-CM | POA: Diagnosis not present

## 2022-03-26 DIAGNOSIS — S86011A Strain of right Achilles tendon, initial encounter: Secondary | ICD-10-CM | POA: Diagnosis not present

## 2022-03-26 DIAGNOSIS — M79661 Pain in right lower leg: Secondary | ICD-10-CM | POA: Diagnosis not present

## 2022-03-26 DIAGNOSIS — X58XXXD Exposure to other specified factors, subsequent encounter: Secondary | ICD-10-CM | POA: Diagnosis not present

## 2022-03-26 DIAGNOSIS — S86011D Strain of right Achilles tendon, subsequent encounter: Secondary | ICD-10-CM | POA: Diagnosis not present

## 2022-03-28 DIAGNOSIS — S86011A Strain of right Achilles tendon, initial encounter: Secondary | ICD-10-CM | POA: Diagnosis not present

## 2022-03-28 DIAGNOSIS — R2689 Other abnormalities of gait and mobility: Secondary | ICD-10-CM | POA: Diagnosis not present

## 2022-03-28 DIAGNOSIS — S86011D Strain of right Achilles tendon, subsequent encounter: Secondary | ICD-10-CM | POA: Diagnosis not present

## 2022-03-28 DIAGNOSIS — X58XXXD Exposure to other specified factors, subsequent encounter: Secondary | ICD-10-CM | POA: Diagnosis not present

## 2022-03-28 DIAGNOSIS — M79661 Pain in right lower leg: Secondary | ICD-10-CM | POA: Diagnosis not present

## 2022-03-28 DIAGNOSIS — M25671 Stiffness of right ankle, not elsewhere classified: Secondary | ICD-10-CM | POA: Diagnosis not present

## 2022-04-03 DIAGNOSIS — X58XXXD Exposure to other specified factors, subsequent encounter: Secondary | ICD-10-CM | POA: Diagnosis not present

## 2022-04-03 DIAGNOSIS — R2689 Other abnormalities of gait and mobility: Secondary | ICD-10-CM | POA: Diagnosis not present

## 2022-04-03 DIAGNOSIS — M25671 Stiffness of right ankle, not elsewhere classified: Secondary | ICD-10-CM | POA: Diagnosis not present

## 2022-04-03 DIAGNOSIS — S86011D Strain of right Achilles tendon, subsequent encounter: Secondary | ICD-10-CM | POA: Diagnosis not present

## 2022-04-03 DIAGNOSIS — M79661 Pain in right lower leg: Secondary | ICD-10-CM | POA: Diagnosis not present

## 2022-04-03 DIAGNOSIS — S86011A Strain of right Achilles tendon, initial encounter: Secondary | ICD-10-CM | POA: Diagnosis not present

## 2022-04-05 DIAGNOSIS — R2689 Other abnormalities of gait and mobility: Secondary | ICD-10-CM | POA: Diagnosis not present

## 2022-04-05 DIAGNOSIS — Z6829 Body mass index (BMI) 29.0-29.9, adult: Secondary | ICD-10-CM | POA: Diagnosis not present

## 2022-04-05 DIAGNOSIS — X58XXXD Exposure to other specified factors, subsequent encounter: Secondary | ICD-10-CM | POA: Diagnosis not present

## 2022-04-05 DIAGNOSIS — M79661 Pain in right lower leg: Secondary | ICD-10-CM | POA: Diagnosis not present

## 2022-04-05 DIAGNOSIS — S86011D Strain of right Achilles tendon, subsequent encounter: Secondary | ICD-10-CM | POA: Diagnosis not present

## 2022-04-05 DIAGNOSIS — M4712 Other spondylosis with myelopathy, cervical region: Secondary | ICD-10-CM | POA: Diagnosis not present

## 2022-04-05 DIAGNOSIS — S86011A Strain of right Achilles tendon, initial encounter: Secondary | ICD-10-CM | POA: Diagnosis not present

## 2022-04-05 DIAGNOSIS — M542 Cervicalgia: Secondary | ICD-10-CM | POA: Diagnosis not present

## 2022-04-05 DIAGNOSIS — M25671 Stiffness of right ankle, not elsewhere classified: Secondary | ICD-10-CM | POA: Diagnosis not present

## 2022-04-09 ENCOUNTER — Other Ambulatory Visit: Payer: Self-pay | Admitting: Neurosurgery

## 2022-04-10 DIAGNOSIS — S86011D Strain of right Achilles tendon, subsequent encounter: Secondary | ICD-10-CM | POA: Diagnosis not present

## 2022-04-10 DIAGNOSIS — X58XXXD Exposure to other specified factors, subsequent encounter: Secondary | ICD-10-CM | POA: Diagnosis not present

## 2022-04-10 DIAGNOSIS — R2689 Other abnormalities of gait and mobility: Secondary | ICD-10-CM | POA: Diagnosis not present

## 2022-04-10 DIAGNOSIS — M25671 Stiffness of right ankle, not elsewhere classified: Secondary | ICD-10-CM | POA: Diagnosis not present

## 2022-04-10 DIAGNOSIS — M79661 Pain in right lower leg: Secondary | ICD-10-CM | POA: Diagnosis not present

## 2022-04-10 DIAGNOSIS — S86011A Strain of right Achilles tendon, initial encounter: Secondary | ICD-10-CM | POA: Diagnosis not present

## 2022-04-11 DIAGNOSIS — M79661 Pain in right lower leg: Secondary | ICD-10-CM | POA: Diagnosis not present

## 2022-04-11 DIAGNOSIS — M25671 Stiffness of right ankle, not elsewhere classified: Secondary | ICD-10-CM | POA: Diagnosis not present

## 2022-04-11 DIAGNOSIS — R2689 Other abnormalities of gait and mobility: Secondary | ICD-10-CM | POA: Diagnosis not present

## 2022-04-11 DIAGNOSIS — S86011D Strain of right Achilles tendon, subsequent encounter: Secondary | ICD-10-CM | POA: Diagnosis not present

## 2022-04-11 DIAGNOSIS — S86011A Strain of right Achilles tendon, initial encounter: Secondary | ICD-10-CM | POA: Diagnosis not present

## 2022-04-11 DIAGNOSIS — X58XXXD Exposure to other specified factors, subsequent encounter: Secondary | ICD-10-CM | POA: Diagnosis not present

## 2022-04-15 DIAGNOSIS — X58XXXD Exposure to other specified factors, subsequent encounter: Secondary | ICD-10-CM | POA: Diagnosis not present

## 2022-04-15 DIAGNOSIS — M25671 Stiffness of right ankle, not elsewhere classified: Secondary | ICD-10-CM | POA: Diagnosis not present

## 2022-04-15 DIAGNOSIS — S86011A Strain of right Achilles tendon, initial encounter: Secondary | ICD-10-CM | POA: Diagnosis not present

## 2022-04-15 DIAGNOSIS — S86011D Strain of right Achilles tendon, subsequent encounter: Secondary | ICD-10-CM | POA: Diagnosis not present

## 2022-04-15 DIAGNOSIS — M79661 Pain in right lower leg: Secondary | ICD-10-CM | POA: Diagnosis not present

## 2022-04-15 DIAGNOSIS — R2689 Other abnormalities of gait and mobility: Secondary | ICD-10-CM | POA: Diagnosis not present

## 2022-04-18 DIAGNOSIS — E1165 Type 2 diabetes mellitus with hyperglycemia: Secondary | ICD-10-CM | POA: Diagnosis not present

## 2022-04-18 DIAGNOSIS — I10 Essential (primary) hypertension: Secondary | ICD-10-CM | POA: Diagnosis not present

## 2022-04-18 DIAGNOSIS — Z23 Encounter for immunization: Secondary | ICD-10-CM | POA: Diagnosis not present

## 2022-04-18 DIAGNOSIS — Z299 Encounter for prophylactic measures, unspecified: Secondary | ICD-10-CM | POA: Diagnosis not present

## 2022-04-18 DIAGNOSIS — M545 Low back pain, unspecified: Secondary | ICD-10-CM | POA: Diagnosis not present

## 2022-04-18 DIAGNOSIS — Z6831 Body mass index (BMI) 31.0-31.9, adult: Secondary | ICD-10-CM | POA: Diagnosis not present

## 2022-04-18 DIAGNOSIS — E538 Deficiency of other specified B group vitamins: Secondary | ICD-10-CM | POA: Diagnosis not present

## 2022-04-18 DIAGNOSIS — G8929 Other chronic pain: Secondary | ICD-10-CM | POA: Diagnosis not present

## 2022-04-19 DIAGNOSIS — X58XXXD Exposure to other specified factors, subsequent encounter: Secondary | ICD-10-CM | POA: Diagnosis not present

## 2022-04-19 DIAGNOSIS — M25671 Stiffness of right ankle, not elsewhere classified: Secondary | ICD-10-CM | POA: Diagnosis not present

## 2022-04-19 DIAGNOSIS — M79661 Pain in right lower leg: Secondary | ICD-10-CM | POA: Diagnosis not present

## 2022-04-19 DIAGNOSIS — S86011A Strain of right Achilles tendon, initial encounter: Secondary | ICD-10-CM | POA: Diagnosis not present

## 2022-04-19 DIAGNOSIS — R2689 Other abnormalities of gait and mobility: Secondary | ICD-10-CM | POA: Diagnosis not present

## 2022-04-19 DIAGNOSIS — S86011D Strain of right Achilles tendon, subsequent encounter: Secondary | ICD-10-CM | POA: Diagnosis not present

## 2022-04-23 DIAGNOSIS — H2513 Age-related nuclear cataract, bilateral: Secondary | ICD-10-CM | POA: Diagnosis not present

## 2022-04-23 DIAGNOSIS — Z7984 Long term (current) use of oral hypoglycemic drugs: Secondary | ICD-10-CM | POA: Diagnosis not present

## 2022-04-23 DIAGNOSIS — E119 Type 2 diabetes mellitus without complications: Secondary | ICD-10-CM | POA: Diagnosis not present

## 2022-04-23 DIAGNOSIS — Z794 Long term (current) use of insulin: Secondary | ICD-10-CM | POA: Diagnosis not present

## 2022-04-30 DIAGNOSIS — M47816 Spondylosis without myelopathy or radiculopathy, lumbar region: Secondary | ICD-10-CM | POA: Diagnosis not present

## 2022-04-30 NOTE — Progress Notes (Addendum)
Surgical Instructions    Your procedure is scheduled on Wednesday October 11th.  Report to [facility] Main Entrance "A" at 6:30 A.M., then check in with the Admitting office.  Call this number if you have problems the morning of surgery:  [phone]   If you have any questions prior to your surgery date call [phone]: Open Monday-Friday 8am-4pm If you experience any cold or flu symptoms such as cough, fever, chills, shortness of breath, etc. between now and your scheduled surgery, please notify us at the above number     Remember:  Do not eat or drink after midnight the night before your surgery     Take these medicines the morning of surgery with A SIP OF WATER: azelastine (ASTELIN) 0.1 % nasal spray esomeprazole (NEXIUM) 40 MG capsule HYDROcodone-acetaminophen (NORCO) 10-325 MG tablet metoprolol succinate (TOPROL-XL) 50 MG 24 hr tablet rosuvastatin (CRESTOR) 20 MG tablet tamsulosin (FLOMAX) 0.4 MG CAPS capsule    As of today, STOP taking CELEBREX and any Aspirin (unless otherwise instructed by your surgeon) Aleve, Naproxen, Ibuprofen, Motrin, Advil, Goody's, BC's, all herbal medications, fish oil, and all vitamins.  WHAT DO I DO ABOUT MY DIABETES MEDICATION?   Do not take oral diabetes medicines (Jardiance, Metformin) the morning of surgery.  HOLD your JARDIANCE for 72 hours (3 days) prior to your surgery  THE NIGHT BEFORE SURGERY, take 70% of your NPH 70/30 insulin. You may take your normal dose of 70/30 the morning before your surgery.      THE DAY OF SURGERY, IF type 2 diabetic do not take your NPH 70/30 insulin.  The day of surgery, do not take other diabetes injectables, including Byetta (exenatide), Bydureon (exenatide ER), Victoza (liraglutide), or Trulicity (dulaglutide).  If your CBG is greater than 220 mg/dL, you may take  of your sliding scale (correction) dose of insulin.   HOW TO MANAGE YOUR DIABETES BEFORE AND AFTER SURGERY  Why is it important  to control my blood sugar before and after surgery? Improving blood sugar levels before and after surgery helps healing and can limit problems. A way of improving blood sugar control is eating a healthy diet by:  Eating less sugar and carbohydrates  Increasing activity/exercise  Talking with your doctor about reaching your blood sugar goals High blood sugars (greater than 180 mg/dL) can raise your risk of infections and slow your recovery, so you will need to focus on controlling your diabetes during the weeks before surgery. Make sure that the doctor who takes care of your diabetes knows about your planned surgery including the date and location.  How do I manage my blood sugar before surgery? Check your blood sugar at least 4 times a day, starting 2 days before surgery, to make sure that the level is not too high or low.  Check your blood sugar the morning of your surgery when you wake up and every 2 hours until you get to the Short Stay unit.  If your blood sugar is less than 70 mg/dL, you will need to treat for low blood sugar: Do not take insulin. Treat a low blood sugar (less than 70 mg/dL) with  cup of clear juice (cranberry or apple), 4 glucose tablets, OR glucose gel. Recheck blood sugar in 15 minutes after treatment (to make sure it is greater than 70 mg/dL). If your blood sugar is not greater than 70 mg/dL on recheck, call [phone] for further instructions. Report your blood sugar to the short stay nurse when you get  to Short Stay.  If you are admitted to the hospital after surgery: Your blood sugar will be checked by the staff and you will probably be given insulin after surgery (instead of oral diabetes medicines) to make sure you have good blood sugar levels. The goal for blood sugar control after surgery is 80-180 mg/dL.     DAY OF SURGERY      Do not wear jewelry . Do not wear lotions, powders, cologne or deodorant. Do not shave 48 hours prior to surgery.  Men may  shave face and neck. Do not bring valuables to the hospital. Do not wear nail polish  [facility] is not responsible for any belongings or valuables.    Do NOT Smoke (Tobacco/Vaping)  24 hours prior to your procedure  If you use a CPAP at night, you may bring your mask for your overnight stay.   Contacts, glasses, hearing aids, dentures or partials may not be worn into surgery, please bring cases for these belongings   For patients admitted to the hospital, discharge time will be determined by your treatment team.   Patients discharged the day of surgery will not be allowed to drive home, and someone needs to stay with them for 24 hours.   SURGICAL WAITING ROOM VISITATION Patients having surgery or a procedure may have no more than 2 support people in the waiting area - these visitors may rotate.   Children under the age of 56 must have an adult with them who is not the patient. If the patient needs to stay at the hospital during part of their recovery, the visitor guidelines for inpatient rooms apply. Pre-op nurse will coordinate an appropriate time for 1 support person to accompany patient in pre-op.  This support person may not rotate.   Please refer to the [facility] website for the visitor guidelines for Inpatients (after your surgery is over and you are in a regular room).    Special instructions:    Oral Hygiene is also important to reduce your risk of infection.  Remember - BRUSH YOUR TEETH THE MORNING OF SURGERY WITH YOUR REGULAR TOOTHPASTE   [facility]- Preparing For Surgery  Before surgery, you can play an important role. Because skin is not sterile, your skin needs to be as free of germs as possible. You can reduce the number of germs on your skin by washing with CHG (chlorahexidine gluconate) Soap before surgery.  CHG is an antiseptic cleaner which kills germs and bonds with the skin to continue killing germs even after washing.     Please do not use if you have an  allergy to CHG or antibacterial soaps. If your skin becomes reddened/irritated stop using the CHG.  Do not shave (including legs and underarms) for at least 48 hours prior to first CHG shower. It is OK to shave your face.  Please follow these instructions carefully.     Shower the NIGHT BEFORE SURGERY and the MORNING OF SURGERY with CHG Soap.   If you chose to wash your hair, wash your hair first as usual with your normal shampoo. After you shampoo, rinse your hair and body thoroughly to remove the shampoo.  Then [employer] and genitals (private parts) with your normal soap and rinse thoroughly to remove soap.  After that Use CHG Soap as you would any other liquid soap. You can apply CHG directly to the skin and wash gently with a scrungie or a clean washcloth.   Apply the CHG Soap  to your body ONLY FROM THE NECK DOWN.  Do not use on open wounds or open sores. Avoid contact with your eyes, ears, mouth and genitals (private parts). Wash Face and genitals (private parts)  with your normal soap.   Wash thoroughly, paying special attention to the area where your surgery will be performed.  Thoroughly rinse your body with warm water from the neck down.  DO NOT shower/wash with your normal soap after using and rinsing off the CHG Soap.  Pat yourself dry with a CLEAN TOWEL.  Wear CLEAN PAJAMAS to bed the night before surgery  Place CLEAN SHEETS on your bed the night before your surgery  DO NOT SLEEP WITH PETS.   Day of Surgery:  Take a shower with CHG soap. Wear Clean/Comfortable clothing the morning of surgery Do not apply any deodorants/lotions.   Remember to brush your teeth WITH YOUR REGULAR TOOTHPASTE.    If you received a COVID test during your pre-op visit, it is requested that you wear a mask when out in public, stay away from anyone that may not be feeling well, and notify your surgeon if you develop symptoms. If you have been in contact with anyone that has tested positive in  the last 10 days, please notify your surgeon.    Please read over the following fact sheets that you were given.

## 2022-05-01 ENCOUNTER — Other Ambulatory Visit: Payer: Self-pay

## 2022-05-01 ENCOUNTER — Encounter (HOSPITAL_COMMUNITY): Payer: Self-pay

## 2022-05-01 ENCOUNTER — Encounter (HOSPITAL_COMMUNITY)
Admission: RE | Admit: 2022-05-01 | Discharge: 2022-05-01 | Disposition: A | Discharge status: Home / Self Care | Payer: Medicare HMO | Source: Ambulatory Visit | Attending: Neurosurgery | Admitting: Neurosurgery

## 2022-05-01 VITALS — BP 151/70 | HR 67 | Temp 98.3°F | Resp 17 | Ht 71.0 in | Wt 218.0 lb

## 2022-05-01 DIAGNOSIS — Z794 Long term (current) use of insulin: Secondary | ICD-10-CM | POA: Diagnosis not present

## 2022-05-01 DIAGNOSIS — Z01818 Encounter for other preprocedural examination: Secondary | ICD-10-CM | POA: Insufficient documentation

## 2022-05-01 DIAGNOSIS — E119 Type 2 diabetes mellitus without complications: Secondary | ICD-10-CM | POA: Insufficient documentation

## 2022-05-01 HISTORY — DX: Personal history of urinary calculi: Z87.442

## 2022-05-01 LAB — BASIC METABOLIC PANEL WITH GFR
Anion gap: 4 — ABNORMAL LOW (ref 5–15)
BUN: 13 mg/dL (ref 8–23)
CO2: 28 mmol/L (ref 22–32)
Calcium: 8.9 mg/dL (ref 8.9–10.3)
Chloride: 107 mmol/L (ref 98–111)
Creatinine, Ser: 0.71 mg/dL (ref 0.61–1.24)
GFR, Estimated: >60 mL/min (ref >60)
Glucose, Bld: 222 mg/dL — ABNORMAL HIGH (ref 70–99)
Potassium: 4.1 mmol/L (ref 3.5–5.1)
Sodium: 139 mmol/L (ref 135–145)

## 2022-05-01 LAB — CBC
HCT: 44.1 % (ref 39.0–52.0)
Hemoglobin: 13.4 g/dL (ref 13.0–17.0)
MCH: 25.7 pg — ABNORMAL LOW (ref 26.0–34.0)
MCHC: 30.4 g/dL (ref 30.0–36.0)
MCV: 84.6 fL (ref 80.0–100.0)
Platelets: 188 10*3/uL (ref 150–400)
RBC: 5.21 MIL/uL (ref 4.22–5.81)
RDW: 14.5 % (ref 11.5–15.5)
WBC: 9.4 10*3/uL (ref 4.0–10.5)
nRBC: 0 % (ref 0.0–0.2)

## 2022-05-01 LAB — SURGICAL PCR SCREEN
MRSA, PCR: NEGATIVE
Staphylococcus aureus: POSITIVE — AB

## 2022-05-01 LAB — HEMOGLOBIN A1C
Hgb A1c MFr Bld: 8 % — ABNORMAL HIGH (ref 4.8–5.6)
Mean Plasma Glucose: 182.9 mg/dL

## 2022-05-01 LAB — TYPE AND SCREEN
ABO/RH(D): A NEG
Antibody Screen: NEGATIVE

## 2022-05-01 LAB — GLUCOSE, CAPILLARY: Glucose-Capillary: 216 mg/dL — ABNORMAL HIGH (ref 70–99)

## 2022-05-01 NOTE — Progress Notes (Addendum)
PCP - Monico Blitz Cardiologist - Denies  PPM/ICD - Denies  Chest x-ray - 05/16/21 EKG - 05/01/22  Stress Test - "oh yeah, two or three of them. I cannot remember when the last one was" Followed up with cardiology once 10 years ago.  ECHO - Unsure Cardiac Cath - Denies  Sleep Study - Yes had 3-4 times no OSA CPAP - No  DM - Type II Fasting Blood Sugar -110 CBG at PAT appt 216 had eaten lunch an hour ago  Checks Blood Sugar __2___ times a day  Blood Thinner Instructions: Denies Aspirin Instructions: Denies    COVID TEST- NI   Anesthesia review: yes abnormal EKG  Patient denies shortness of breath, fever, cough and chest pain at PAT appointment   All instructions explained to the patient, with a verbal understanding of the material. Patient agrees to go over the instructions while at home for a better understanding. . The opportunity to ask questions was provided.

## 2022-05-02 ENCOUNTER — Encounter (HOSPITAL_COMMUNITY): Payer: Self-pay

## 2022-05-02 ENCOUNTER — Encounter (HOSPITAL_COMMUNITY): Payer: Self-pay | Admitting: Physician Assistant

## 2022-05-14 ENCOUNTER — Other Ambulatory Visit: Payer: Self-pay | Admitting: Neurosurgery

## 2022-05-16 ENCOUNTER — Encounter (HOSPITAL_COMMUNITY): Admission: RE | Payer: Self-pay | Source: Home / Self Care

## 2022-05-16 ENCOUNTER — Inpatient Hospital Stay (HOSPITAL_COMMUNITY): Admission: RE | Admit: 2022-05-16 | Payer: Medicare HMO | Source: Home / Self Care | Admitting: Neurosurgery

## 2022-05-16 SURGERY — ANTERIOR CERVICAL DECOMPRESSION/DISCECTOMY FUSION 2 LEVEL/HARDWARE REMOVAL
Anesthesia: General

## 2022-05-20 DIAGNOSIS — Z299 Encounter for prophylactic measures, unspecified: Secondary | ICD-10-CM | POA: Diagnosis not present

## 2022-05-20 DIAGNOSIS — M542 Cervicalgia: Secondary | ICD-10-CM | POA: Diagnosis not present

## 2022-05-20 DIAGNOSIS — E538 Deficiency of other specified B group vitamins: Secondary | ICD-10-CM | POA: Diagnosis not present

## 2022-05-20 DIAGNOSIS — Z6831 Body mass index (BMI) 31.0-31.9, adult: Secondary | ICD-10-CM | POA: Diagnosis not present

## 2022-05-20 DIAGNOSIS — I10 Essential (primary) hypertension: Secondary | ICD-10-CM | POA: Diagnosis not present

## 2022-05-21 ENCOUNTER — Other Ambulatory Visit: Payer: Self-pay | Admitting: Neurosurgery

## 2022-05-22 ENCOUNTER — Inpatient Hospital Stay (HOSPITAL_COMMUNITY)
Admission: RE | Admit: 2022-05-22 | Discharge: 2022-05-23 | DRG: 473 | Disposition: A | Discharge status: Home / Self Care | Payer: Medicare HMO | Attending: Neurosurgery | Admitting: Neurosurgery

## 2022-05-22 ENCOUNTER — Inpatient Hospital Stay (HOSPITAL_COMMUNITY): Payer: Medicare HMO | Admitting: Anesthesiology

## 2022-05-22 ENCOUNTER — Inpatient Hospital Stay (HOSPITAL_COMMUNITY): Payer: Medicare HMO

## 2022-05-22 ENCOUNTER — Encounter (HOSPITAL_COMMUNITY)
Admission: RE | Disposition: A | Discharge status: Home / Self Care | Payer: Self-pay | Source: Home / Self Care | Attending: Neurosurgery

## 2022-05-22 ENCOUNTER — Other Ambulatory Visit: Payer: Self-pay

## 2022-05-22 DIAGNOSIS — M2578 Osteophyte, vertebrae: Secondary | ICD-10-CM | POA: Diagnosis present

## 2022-05-22 DIAGNOSIS — Z87891 Personal history of nicotine dependence: Secondary | ICD-10-CM | POA: Diagnosis not present

## 2022-05-22 DIAGNOSIS — M4712 Other spondylosis with myelopathy, cervical region: Secondary | ICD-10-CM | POA: Diagnosis not present

## 2022-05-22 DIAGNOSIS — Z79891 Long term (current) use of opiate analgesic: Secondary | ICD-10-CM | POA: Diagnosis not present

## 2022-05-22 DIAGNOSIS — Z86718 Personal history of other venous thrombosis and embolism: Secondary | ICD-10-CM | POA: Diagnosis not present

## 2022-05-22 DIAGNOSIS — Z79899 Other long term (current) drug therapy: Secondary | ICD-10-CM | POA: Diagnosis not present

## 2022-05-22 DIAGNOSIS — E119 Type 2 diabetes mellitus without complications: Secondary | ICD-10-CM | POA: Diagnosis present

## 2022-05-22 DIAGNOSIS — I10 Essential (primary) hypertension: Secondary | ICD-10-CM

## 2022-05-22 DIAGNOSIS — M4722 Other spondylosis with radiculopathy, cervical region: Secondary | ICD-10-CM | POA: Diagnosis not present

## 2022-05-22 DIAGNOSIS — Z981 Arthrodesis status: Secondary | ICD-10-CM

## 2022-05-22 DIAGNOSIS — M4802 Spinal stenosis, cervical region: Secondary | ICD-10-CM

## 2022-05-22 DIAGNOSIS — Z794 Long term (current) use of insulin: Secondary | ICD-10-CM

## 2022-05-22 DIAGNOSIS — Z7984 Long term (current) use of oral hypoglycemic drugs: Secondary | ICD-10-CM

## 2022-05-22 HISTORY — PX: ANTERIOR CERVICAL DECOMP/DISCECTOMY FUSION: SHX1161

## 2022-05-22 LAB — GLUCOSE, CAPILLARY
Glucose-Capillary: 155 mg/dL — ABNORMAL HIGH (ref 70–99)
Glucose-Capillary: 143 mg/dL — ABNORMAL HIGH (ref 70–99)
Glucose-Capillary: 237 mg/dL — ABNORMAL HIGH (ref 70–99)
Glucose-Capillary: 336 mg/dL — ABNORMAL HIGH (ref 70–99)

## 2022-05-22 LAB — TYPE AND SCREEN
ABO/RH(D): A NEG
Antibody Screen: NEGATIVE

## 2022-05-22 SURGERY — ANTERIOR CERVICAL DECOMPRESSION/DISCECTOMY FUSION 2 LEVEL/HARDWARE REMOVAL
Anesthesia: General

## 2022-05-22 MED ORDER — BUPIVACAINE-EPINEPHRINE (PF) 0.5% -1:200000 IJ SOLN
INTRAMUSCULAR | Status: AC
  Filled 2022-05-22: qty 30

## 2022-05-22 MED ORDER — PANTOPRAZOLE SODIUM 40 MG IV SOLR: 40.0000 mg | Freq: Every day | INTRAVENOUS | Status: DC

## 2022-05-22 MED ORDER — CHLORHEXIDINE GLUCONATE 0.12 % MT SOLN: 15.0000 mL | Freq: Once | OROMUCOSAL | Status: AC

## 2022-05-22 MED ORDER — DOCUSATE SODIUM 100 MG PO CAPS
100.0000 mg | ORAL_CAPSULE | Freq: Two times a day (BID) | ORAL | Status: DC
  Administered 2022-05-22 – 2022-05-23 (×2): 100 mg via ORAL
  Filled 2022-05-22 (×2): qty 1

## 2022-05-22 MED ORDER — EMPAGLIFLOZIN 25 MG PO TABS
25.0000 mg | ORAL_TABLET | Freq: Every day | ORAL | Status: DC
  Administered 2022-05-23: 25 mg via ORAL
  Filled 2022-05-22: qty 1

## 2022-05-22 MED ORDER — CEFAZOLIN SODIUM-DEXTROSE 2-3 GM-%(50ML) IV SOLR
INTRAVENOUS | Status: DC | PRN
  Administered 2022-05-22: 2 g via INTRAVENOUS

## 2022-05-22 MED ORDER — METFORMIN HCL 500 MG PO TABS
1000.0000 mg | ORAL_TABLET | Freq: Two times a day (BID) | ORAL | Status: DC
  Administered 2022-05-23: 1000 mg via ORAL
  Filled 2022-05-22: qty 2

## 2022-05-22 MED ORDER — PHENYLEPHRINE HCL-NACL 20-0.9 MG/250ML-% IV SOLN
INTRAVENOUS | Status: DC | PRN
  Administered 2022-05-22: 25 ug/min via INTRAVENOUS

## 2022-05-22 MED ORDER — BISACODYL 10 MG RE SUPP: 10.0000 mg | Freq: Every day | RECTAL | Status: DC | PRN

## 2022-05-22 MED ORDER — ONDANSETRON HCL 4 MG/2ML IJ SOLN: 4.0000 mg | Freq: Four times a day (QID) | INTRAMUSCULAR | Status: DC | PRN

## 2022-05-22 MED ORDER — PANTOPRAZOLE SODIUM 40 MG PO TBEC: 80.0000 mg | DELAYED_RELEASE_TABLET | Freq: Every day | ORAL | Status: DC

## 2022-05-22 MED ORDER — CLOBETASOL PROPIONATE 0.05 % EX CREA
1.0000 | TOPICAL_CREAM | Freq: Two times a day (BID) | CUTANEOUS | Status: DC | PRN
  Filled 2022-05-22: qty 15

## 2022-05-22 MED ORDER — OXYCODONE HCL 5 MG PO TABS: 5.0000 mg | ORAL_TABLET | Freq: Once | ORAL | Status: DC | PRN

## 2022-05-22 MED ORDER — TRANDOLAPRIL 1 MG PO TABS
2.0000 mg | ORAL_TABLET | Freq: Every day | ORAL | Status: DC
  Administered 2022-05-22 – 2022-05-23 (×2): 2 mg via ORAL
  Filled 2022-05-22 (×3): qty 2

## 2022-05-22 MED ORDER — FENTANYL CITRATE (PF) 250 MCG/5ML IJ SOLN
INTRAMUSCULAR | Status: DC | PRN
  Administered 2022-05-22 (×5): 50 ug via INTRAVENOUS

## 2022-05-22 MED ORDER — AZELASTINE HCL 0.1 % NA SOLN
1.0000 | Freq: Two times a day (BID) | NASAL | Status: DC
  Administered 2022-05-22 – 2022-05-23 (×2): 1 via NASAL
  Filled 2022-05-22 (×2): qty 30

## 2022-05-22 MED ORDER — LACTATED RINGERS IV SOLN: INTRAVENOUS | Status: DC

## 2022-05-22 MED ORDER — ACETAMINOPHEN 325 MG PO TABS
650.0000 mg | ORAL_TABLET | ORAL | Status: DC | PRN
  Administered 2022-05-22 – 2022-05-23 (×2): 650 mg via ORAL
  Filled 2022-05-22 (×2): qty 2

## 2022-05-22 MED ORDER — LIDOCAINE 2% (20 MG/ML) 5 ML SYRINGE
INTRAMUSCULAR | Status: DC | PRN
  Administered 2022-05-22: 60 mg via INTRAVENOUS

## 2022-05-22 MED ORDER — LIDOCAINE 2% (20 MG/ML) 5 ML SYRINGE
INTRAMUSCULAR | Status: AC
  Filled 2022-05-22: qty 5

## 2022-05-22 MED ORDER — 0.9 % SODIUM CHLORIDE (POUR BTL) OPTIME
TOPICAL | Status: DC | PRN
  Administered 2022-05-22: 1000 mL

## 2022-05-22 MED ORDER — EPHEDRINE 5 MG/ML INJ
INTRAVENOUS | Status: AC
  Filled 2022-05-22: qty 5

## 2022-05-22 MED ORDER — SUGAMMADEX SODIUM 200 MG/2ML IV SOLN
INTRAVENOUS | Status: DC | PRN
  Administered 2022-05-22: 200 mg via INTRAVENOUS

## 2022-05-22 MED ORDER — ROCURONIUM BROMIDE 10 MG/ML (PF) SYRINGE
PREFILLED_SYRINGE | INTRAVENOUS | Status: AC
  Filled 2022-05-22: qty 20

## 2022-05-22 MED ORDER — TIZANIDINE HCL 4 MG PO TABS: 4.0000 mg | ORAL_TABLET | Freq: Four times a day (QID) | ORAL | Status: DC | PRN

## 2022-05-22 MED ORDER — CYCLOBENZAPRINE HCL 10 MG PO TABS: 10.0000 mg | ORAL_TABLET | Freq: Three times a day (TID) | ORAL | Status: DC | PRN

## 2022-05-22 MED ORDER — ROCURONIUM BROMIDE 10 MG/ML (PF) SYRINGE
PREFILLED_SYRINGE | INTRAVENOUS | Status: DC | PRN
  Administered 2022-05-22: 10 mg via INTRAVENOUS
  Administered 2022-05-22: 60 mg via INTRAVENOUS
  Administered 2022-05-22 (×2): 20 mg via INTRAVENOUS

## 2022-05-22 MED ORDER — BACITRACIN ZINC 500 UNIT/GM EX OINT
TOPICAL_OINTMENT | CUTANEOUS | Status: DC | PRN
  Administered 2022-05-22: 1 via TOPICAL

## 2022-05-22 MED ORDER — THROMBIN 5000 UNITS EX SOLR
CUTANEOUS | Status: AC
  Filled 2022-05-22: qty 5000

## 2022-05-22 MED ORDER — ACETAMINOPHEN 500 MG PO TABS: 1000.0000 mg | ORAL_TABLET | Freq: Once | ORAL | Status: DC

## 2022-05-22 MED ORDER — BUPIVACAINE-EPINEPHRINE 0.5% -1:200000 IJ SOLN
INTRAMUSCULAR | Status: DC | PRN
  Administered 2022-05-22: 10 mL

## 2022-05-22 MED ORDER — OXYCODONE HCL 5 MG PO TABS: 10.0000 mg | ORAL_TABLET | ORAL | Status: DC | PRN

## 2022-05-22 MED ORDER — PHENOL 1.4 % MT LIQD: 1.0000 | OROMUCOSAL | Status: DC | PRN

## 2022-05-22 MED ORDER — INSULIN ASPART 100 UNIT/ML IJ SOLN
0.0000 [IU] | INTRAMUSCULAR | Status: AC | PRN
  Administered 2022-05-22 (×2): 2 [IU] via SUBCUTANEOUS

## 2022-05-22 MED ORDER — PANTOPRAZOLE SODIUM 40 MG PO TBEC
40.0000 mg | DELAYED_RELEASE_TABLET | Freq: Every day | ORAL | Status: DC
  Administered 2022-05-22: 40 mg via ORAL
  Filled 2022-05-22: qty 1

## 2022-05-22 MED ORDER — LORATADINE 10 MG PO TABS
10.0000 mg | ORAL_TABLET | Freq: Every day | ORAL | Status: DC
  Administered 2022-05-22 – 2022-05-23 (×2): 10 mg via ORAL
  Filled 2022-05-22 (×2): qty 1

## 2022-05-22 MED ORDER — DEXAMETHASONE 4 MG PO TABS
4.0000 mg | ORAL_TABLET | Freq: Four times a day (QID) | ORAL | Status: AC
  Administered 2022-05-22 – 2022-05-23 (×2): 4 mg via ORAL
  Filled 2022-05-22 (×2): qty 1

## 2022-05-22 MED ORDER — PROPOFOL 10 MG/ML IV BOLUS
INTRAVENOUS | Status: AC
  Filled 2022-05-22: qty 20

## 2022-05-22 MED ORDER — DEXAMETHASONE SODIUM PHOSPHATE 10 MG/ML IJ SOLN
INTRAMUSCULAR | Status: DC | PRN
  Administered 2022-05-22: 5 mg via INTRAVENOUS

## 2022-05-22 MED ORDER — CYANOCOBALAMIN 1000 MCG/ML IJ SOLN: 1000.0000 ug | INTRAMUSCULAR | Status: DC

## 2022-05-22 MED ORDER — MENTHOL 3 MG MT LOZG: 1.0000 | LOZENGE | OROMUCOSAL | Status: DC | PRN

## 2022-05-22 MED ORDER — ORAL CARE MOUTH RINSE: 15.0000 mL | Freq: Once | OROMUCOSAL | Status: AC

## 2022-05-22 MED ORDER — FENTANYL CITRATE (PF) 250 MCG/5ML IJ SOLN
INTRAMUSCULAR | Status: AC
  Filled 2022-05-22: qty 5

## 2022-05-22 MED ORDER — EPHEDRINE SULFATE (PRESSORS) 50 MG/ML IJ SOLN
INTRAMUSCULAR | Status: DC | PRN
  Administered 2022-05-22: 10 mg via INTRAVENOUS

## 2022-05-22 MED ORDER — ONDANSETRON HCL 4 MG/2ML IJ SOLN: 4.0000 mg | Freq: Once | INTRAMUSCULAR | Status: DC | PRN

## 2022-05-22 MED ORDER — OXYCODONE HCL 5 MG PO TABS
5.0000 mg | ORAL_TABLET | ORAL | Status: DC | PRN
  Filled 2022-05-22: qty 1

## 2022-05-22 MED ORDER — METOPROLOL SUCCINATE ER 50 MG PO TB24
50.0000 mg | ORAL_TABLET | Freq: Every day | ORAL | Status: DC
  Administered 2022-05-23: 50 mg via ORAL
  Filled 2022-05-22: qty 1

## 2022-05-22 MED ORDER — BACITRACIN ZINC 500 UNIT/GM EX OINT
TOPICAL_OINTMENT | CUTANEOUS | Status: AC
  Filled 2022-05-22: qty 28.35

## 2022-05-22 MED ORDER — ALUM & MAG HYDROXIDE-SIMETH 200-200-20 MG/5ML PO SUSP: 30.0000 mL | Freq: Four times a day (QID) | ORAL | Status: DC | PRN

## 2022-05-22 MED ORDER — INSULIN ASPART 100 UNIT/ML IJ SOLN
0.0000 [IU] | Freq: Three times a day (TID) | INTRAMUSCULAR | Status: DC
  Administered 2022-05-23: 7 [IU] via SUBCUTANEOUS

## 2022-05-22 MED ORDER — DEXAMETHASONE SODIUM PHOSPHATE 10 MG/ML IJ SOLN
INTRAMUSCULAR | Status: AC
  Filled 2022-05-22: qty 1

## 2022-05-22 MED ORDER — ONDANSETRON HCL 4 MG/2ML IJ SOLN
INTRAMUSCULAR | Status: DC | PRN
  Administered 2022-05-22: 4 mg via INTRAVENOUS

## 2022-05-22 MED ORDER — HYDROCHLOROTHIAZIDE 25 MG PO TABS
25.0000 mg | ORAL_TABLET | Freq: Every day | ORAL | Status: DC
  Administered 2022-05-23: 25 mg via ORAL
  Filled 2022-05-22: qty 1

## 2022-05-22 MED ORDER — ESCITALOPRAM OXALATE 10 MG PO TABS
10.0000 mg | ORAL_TABLET | Freq: Every day | ORAL | Status: DC
  Administered 2022-05-22 – 2022-05-23 (×2): 10 mg via ORAL
  Filled 2022-05-22 (×2): qty 1

## 2022-05-22 MED ORDER — ACETAMINOPHEN 325 MG PO TABS
650.0000 mg | ORAL_TABLET | Freq: Once | ORAL | Status: AC
  Administered 2022-05-22: 650 mg via ORAL
  Filled 2022-05-22: qty 2

## 2022-05-22 MED ORDER — PROPOFOL 10 MG/ML IV BOLUS
INTRAVENOUS | Status: DC | PRN
  Administered 2022-05-22: 170 mg via INTRAVENOUS

## 2022-05-22 MED ORDER — MORPHINE SULFATE (PF) 4 MG/ML IV SOLN: 4.0000 mg | INTRAVENOUS | Status: DC | PRN

## 2022-05-22 MED ORDER — CHLORHEXIDINE GLUCONATE 0.12 % MT SOLN
OROMUCOSAL | Status: AC
  Administered 2022-05-22: 15 mL via OROMUCOSAL
  Filled 2022-05-22: qty 15

## 2022-05-22 MED ORDER — FENTANYL CITRATE (PF) 100 MCG/2ML IJ SOLN: 25.0000 ug | INTRAMUSCULAR | Status: DC | PRN

## 2022-05-22 MED ORDER — TAMSULOSIN HCL 0.4 MG PO CAPS
0.4000 mg | ORAL_CAPSULE | Freq: Every day | ORAL | Status: DC
  Administered 2022-05-23: 0.4 mg via ORAL
  Filled 2022-05-22: qty 1

## 2022-05-22 MED ORDER — OXYCODONE HCL 5 MG/5ML PO SOLN: 5.0000 mg | Freq: Once | ORAL | Status: DC | PRN

## 2022-05-22 MED ORDER — LEVOCETIRIZINE DIHYDROCHLORIDE 5 MG PO TABS: 5.0000 mg | ORAL_TABLET | Freq: Every evening | ORAL | Status: DC

## 2022-05-22 MED ORDER — ACETAMINOPHEN 500 MG PO TABS
ORAL_TABLET | ORAL | Status: AC
  Filled 2022-05-22: qty 2

## 2022-05-22 MED ORDER — ACETAMINOPHEN 500 MG PO TABS
1000.0000 mg | ORAL_TABLET | Freq: Four times a day (QID) | ORAL | Status: DC
  Administered 2022-05-23: 1000 mg via ORAL
  Filled 2022-05-22 (×2): qty 2

## 2022-05-22 MED ORDER — INSULIN ASPART 100 UNIT/ML IJ SOLN
0.0000 [IU] | INTRAMUSCULAR | Status: DC
  Administered 2022-05-22: 7 [IU] via SUBCUTANEOUS

## 2022-05-22 MED ORDER — MIDAZOLAM HCL 2 MG/2ML IJ SOLN
INTRAMUSCULAR | Status: AC
  Filled 2022-05-22: qty 2

## 2022-05-22 MED ORDER — ONDANSETRON HCL 4 MG/2ML IJ SOLN
INTRAMUSCULAR | Status: AC
  Filled 2022-05-22: qty 2

## 2022-05-22 MED ORDER — CEFAZOLIN SODIUM-DEXTROSE 2-4 GM/100ML-% IV SOLN
2.0000 g | Freq: Three times a day (TID) | INTRAVENOUS | Status: AC
  Administered 2022-05-22 – 2022-05-23 (×2): 2 g via INTRAVENOUS
  Filled 2022-05-22 (×2): qty 100

## 2022-05-22 MED ORDER — ACETAMINOPHEN 650 MG RE SUPP: 650.0000 mg | RECTAL | Status: DC | PRN

## 2022-05-22 MED ORDER — HYDROCODONE-ACETAMINOPHEN 10-325 MG PO TABS
1.0000 | ORAL_TABLET | Freq: Four times a day (QID) | ORAL | Status: DC
  Administered 2022-05-22 – 2022-05-23 (×3): 1 via ORAL
  Filled 2022-05-22 (×3): qty 1

## 2022-05-22 MED ORDER — THROMBIN 5000 UNITS EX SOLR
OROMUCOSAL | Status: DC | PRN
  Administered 2022-05-22: 5 mL via TOPICAL

## 2022-05-22 MED ORDER — ROSUVASTATIN CALCIUM 20 MG PO TABS
20.0000 mg | ORAL_TABLET | Freq: Every day | ORAL | Status: DC
  Administered 2022-05-23: 20 mg via ORAL
  Filled 2022-05-22: qty 1

## 2022-05-22 MED ORDER — ZOLPIDEM TARTRATE 5 MG PO TABS
10.0000 mg | ORAL_TABLET | Freq: Every day | ORAL | Status: DC
  Administered 2022-05-22: 10 mg via ORAL
  Filled 2022-05-22: qty 2

## 2022-05-22 MED ORDER — MOMETASONE FUROATE 0.1 % EX CREA
1.0000 | TOPICAL_CREAM | Freq: Two times a day (BID) | CUTANEOUS | Status: DC | PRN
  Filled 2022-05-22: qty 15

## 2022-05-22 MED ORDER — DEXAMETHASONE SODIUM PHOSPHATE 4 MG/ML IJ SOLN: 4.0000 mg | Freq: Four times a day (QID) | INTRAMUSCULAR | Status: AC

## 2022-05-22 MED ORDER — INSULIN ASPART 100 UNIT/ML IJ SOLN
0.0000 [IU] | Freq: Every day | INTRAMUSCULAR | Status: DC
  Administered 2022-05-22: 4 [IU] via SUBCUTANEOUS

## 2022-05-22 MED ORDER — LACTATED RINGERS IV SOLN: INTRAVENOUS | Status: DC | PRN

## 2022-05-22 MED ORDER — MIDAZOLAM HCL 5 MG/5ML IJ SOLN
INTRAMUSCULAR | Status: DC | PRN
  Administered 2022-05-22 (×2): 1 mg via INTRAVENOUS

## 2022-05-22 MED ORDER — ONDANSETRON HCL 4 MG PO TABS: 4.0000 mg | ORAL_TABLET | Freq: Four times a day (QID) | ORAL | Status: DC | PRN

## 2022-05-22 SURGICAL SUPPLY — 62 items
APL SKNCLS STERI-STRIP NONHPOA (GAUZE/BANDAGES/DRESSINGS) ×1
BAG COUNTER SPONGE SURGICOUNT (BAG) ×1 IMPLANT
BAG SPNG CNTER NS LX DISP (BAG) ×3
BAND INSRT 18 STRL LF DISP RB (MISCELLANEOUS)
BAND RUBBER #18 3X1/16 STRL (MISCELLANEOUS) IMPLANT
BENZOIN TINCTURE PRP APPL 2/3 (GAUZE/BANDAGES/DRESSINGS) ×2 IMPLANT
BIT DRILL NEURO 2X3.1 SFT TUCH (MISCELLANEOUS) ×1 IMPLANT
BLADE SURG 15 STRL LF DISP TIS (BLADE) ×1 IMPLANT
BLADE SURG 15 STRL SS (BLADE) ×1
BLADE ULTRA TIP 2M (BLADE) ×1 IMPLANT
BUR BARREL STRAIGHT FLUTE 4.0 (BURR) ×1 IMPLANT
BUR MATCHSTICK NEURO 3.0 LAGG (BURR) ×1 IMPLANT
CANISTER SUCT 3000ML PPV (MISCELLANEOUS) ×1 IMPLANT
COVER MAYO STAND STRL (DRAPES) ×1 IMPLANT
DRAIN JACKSON PRATT 10MM FLAT (MISCELLANEOUS) IMPLANT
DRAPE LAPAROTOMY 100X72 PEDS (DRAPES) ×1 IMPLANT
DRAPE MICROSCOPE SLANT 54X150 (MISCELLANEOUS) IMPLANT
DRAPE SURG 17X23 STRL (DRAPES) ×2 IMPLANT
DRILL NEURO 2X3.1 SOFT TOUCH (MISCELLANEOUS) ×1
DRSG OPSITE POSTOP 3X4 (GAUZE/BANDAGES/DRESSINGS) ×1 IMPLANT
DRSG OPSITE POSTOP 4X6 (GAUZE/BANDAGES/DRESSINGS) IMPLANT
ELECT BLADE 4.0 EZ CLEAN MEGAD (MISCELLANEOUS) ×1
ELECT REM PT RETURN 9FT ADLT (ELECTROSURGICAL) ×1
ELECTRODE BLDE 4.0 EZ CLN MEGD (MISCELLANEOUS) IMPLANT
ELECTRODE REM PT RTRN 9FT ADLT (ELECTROSURGICAL) ×1 IMPLANT
EVACUATOR SILICONE 100CC (DRAIN) IMPLANT
GAUZE 4X4 16PLY ~~LOC~~+RFID DBL (SPONGE) IMPLANT
GLOVE BIO SURGEON STRL SZ 6.5 (GLOVE) ×1 IMPLANT
GLOVE BIO SURGEON STRL SZ8 (GLOVE) ×1 IMPLANT
GLOVE BIO SURGEON STRL SZ8.5 (GLOVE) ×1 IMPLANT
GLOVE BIOGEL PI IND STRL 6.5 (GLOVE) ×1 IMPLANT
GLOVE EXAM NITRILE XL STR (GLOVE) IMPLANT
GOWN STRL REUS W/ TWL LRG LVL3 (GOWN DISPOSABLE) ×1 IMPLANT
GOWN STRL REUS W/ TWL XL LVL3 (GOWN DISPOSABLE) IMPLANT
GOWN STRL REUS W/TWL LRG LVL3 (GOWN DISPOSABLE) ×1
GOWN STRL REUS W/TWL XL LVL3 (GOWN DISPOSABLE)
HEMOSTAT POWDER KIT SURGIFOAM (HEMOSTASIS) ×1 IMPLANT
KIT BASIN OR (CUSTOM PROCEDURE TRAY) ×1 IMPLANT
KIT TURNOVER KIT B (KITS) ×1 IMPLANT
MARKER SKIN DUAL TIP RULER LAB (MISCELLANEOUS) ×1 IMPLANT
NDL SPNL 18GX3.5 QUINCKE PK (NEEDLE) ×1 IMPLANT
NEEDLE HYPO 22GX1.5 SAFETY (NEEDLE) ×1 IMPLANT
NEEDLE SPNL 18GX3.5 QUINCKE PK (NEEDLE) ×1 IMPLANT
NS IRRIG 1000ML POUR BTL (IV SOLUTION) ×1 IMPLANT
PACK LAMINECTOMY NEURO (CUSTOM PROCEDURE TRAY) ×1 IMPLANT
PATTIES SURGICAL 1X1 (DISPOSABLE) IMPLANT
PIN DISTRACTION 14MM (PIN) ×2 IMPLANT
PLATE SKYLINE TWO LEVEL 32MM (Plate) IMPLANT
PUTTY DBM 2CC CALC GRAN (Putty) IMPLANT
RASP 3.0MM (RASP) IMPLANT
SCREW VAR SELF TAP SKYLINE 14M (Screw) IMPLANT
SPACER CIF 7 4D SM (Spacer) IMPLANT
SPIKE FLUID TRANSFER (MISCELLANEOUS) ×1 IMPLANT
SPONGE INTESTINAL PEANUT (DISPOSABLE) ×2 IMPLANT
SPONGE SURGIFOAM ABS GEL SZ50 (HEMOSTASIS) IMPLANT
STRIP CLOSURE SKIN 1/2X4 (GAUZE/BANDAGES/DRESSINGS) ×1 IMPLANT
SUT VIC AB 0 CT1 27 (SUTURE) ×1
SUT VIC AB 0 CT1 27XBRD ANTBC (SUTURE) ×1 IMPLANT
SUT VIC AB 3-0 SH 8-18 (SUTURE) ×1 IMPLANT
TOWEL GREEN STERILE (TOWEL DISPOSABLE) ×1 IMPLANT
TOWEL GREEN STERILE FF (TOWEL DISPOSABLE) ×1 IMPLANT
WATER STERILE IRR 1000ML POUR (IV SOLUTION) ×1 IMPLANT

## 2022-05-22 NOTE — Anesthesia Preprocedure Evaluation (Addendum)
Anesthesia Evaluation  Patient identified by MRN, date of birth, ID band Patient awake    Reviewed: Allergy & Precautions, NPO status , Patient's Chart, lab work & pertinent test results, reviewed documented beta blocker date and time   History of Anesthesia Complications Negative for: history of anesthetic complications  Airway Mallampati: II  TM Distance: >3 FB Neck ROM: Full    Dental  (+) Dental Advisory Given, Upper Dentures, Lower Dentures   Pulmonary sleep apnea , former smoker   Pulmonary exam normal        Cardiovascular hypertension, Pt. on medications and Pt. on home beta blockers + DVT  Normal cardiovascular exam     Neuro/Psych  PSYCHIATRIC DISORDERS  Depression     Hx spontaneous SAH     GI/Hepatic Neg liver ROS, PUD,GERD  Medicated and Controlled,,  Endo/Other  diabetes, Type 2, Insulin Dependent, Oral Hypoglycemic Agents   Obesity   Renal/GU negative Renal ROS     Musculoskeletal negative musculoskeletal ROS (+)    Abdominal   Peds  Hematology negative hematology ROS (+)   Anesthesia Other Findings   Reproductive/Obstetrics                             Anesthesia Physical Anesthesia Plan  ASA: 3  Anesthesia Plan: General   Post-op Pain Management: Tylenol PO (pre-op)*   Induction: Intravenous  PONV Risk Score and Plan: 2 and Treatment may vary due to age or medical condition, Ondansetron and Dexamethasone  Airway Management Planned: Oral ETT and Video Laryngoscope Planned  Additional Equipment: None  Intra-op Plan:   Post-operative Plan: Extubation in OR  Informed Consent: I have reviewed the patients History and Physical, chart, labs and discussed the procedure including the risks, benefits and alternatives for the proposed anesthesia with the patient or authorized representative who has indicated his/her understanding and acceptance.     Dental  advisory given  Plan Discussed with: CRNA and Anesthesiologist  Anesthesia Plan Comments:         Anesthesia Quick Evaluation

## 2022-05-22 NOTE — Op Note (Signed)
Brief history: The patient is a 71 year old diabetic white male on whom another physician performed a C5-6 and C6-7 anterior cervicectomy fusion plating years ago.  He is developed recurrent neck and right shoulder/arm pain consistent with a cervical radiculopathy.  He has failed medical management and was worked up with a cervical MRI which demonstrated spondylosis, stenosis, etc. at C3-4 and C4-5.  I discussed the various treatment options.  He has decided to proceed with surgery.  Preoperative diagnosis: Cervical spondylosis, cervical stenosis, cervical radiculopathy, cervical myelopathy, cervicalgia  Postoperative diagnosis: The same  Procedure: C3-4 and C4-5 anterior cervical discectomy/decompression; C3-4 and C4-5 interbody arthrodesis with local morcellized autograft bone and Zimmer DBM; insertion of interbody prosthesis at C3-4 and C4-5 (DePuy titanium interbody prosthesis); anterior cervical plating from C3-C5 with DePuy titanium plate; exploration of cervical fusion/removal of cervical hardware  Surgeon: Dr. Earle Gell  Asst.: Dr. Ashok Pall and Arnetha Massy, NP  Anesthesia: Gen. endotracheal  Estimated blood loss: 25 cc  Drains: None  Complications: None  Description of procedure: The patient was brought to the operating room by the anesthesia team. General endotracheal anesthesia was induced. A roll was placed under the patient's shoulders to keep the neck in the neutral position. The patient's anterior cervical region was then prepared with Betadine scrub and Betadine solution. Sterile drapes were applied.  The area to be incised was then injected with Marcaine with epinephrine solution. I then used a scalpel to make a transverse incision in the patient's left anterior neck. I used the Metzenbaum scissors to dissected through the scar tissue and to divide the platysmal muscle and then to dissect medial to the sternocleidomastoid muscle, jugular vein, and carotid artery. I  carefully dissected down towards the anterior cervical spine identifying the esophagus and retracting it medially. Then using Kitner swabs to clear soft tissue from the anterior cervical spine and to expose the old cervical plate.  We explored the fusion by tempting to remove the upper screws from the old plate.  We could get 1 screw out but the other screw was possible to get out.  I therefore used the metal cutting bur to cut the plate and then I was able to remove the plate and remove the other screw from C5.  We inspected the arthrodesis at C5-6.  It appeared solid.  I then used electrocautery to detach the medial border of the longus colli muscle bilaterally from the C3-4 and C4-5 intervertebral disc spaces. I then inserted the Caspar self-retaining retractor underneath the longus colli muscle bilaterally to provide exposure.  There were huge anterior osteophytes at C3-4 and C4-5.  I had to drill these down to gain access to the disc spaces.  We then incised the intervertebral disc at C4-5. We then performed a partial intervertebral discectomy with a pituitary forceps and the Karlin curettes. I then inserted distraction screws into the vertebral bodies at C4-5. We then distracted the interspace. We then used the high-speed drill to decorticate the vertebral endplates at Y4-8, to drill away the remainder of the intervertebral disc, to drill away some posterior spondylosis, and to thin out the posterior longitudinal ligament. I then incised ligament with the arachnoid knife. We then removed the ligament with a Kerrison punches undercutting the vertebral endplates and decompressing the thecal sac. We then performed foraminotomies about the bilateral C5 nerve roots. This completed the decompression at this level.  We then repeated the procedure in analogous fashion the C3-4 decompressing the thecal sac and the bilateral C4  nerve roots.  We now turned our to attention to the interbody fusion. We used the  trial spacers to determine the appropriate size for the interbody prosthesis. We then pre-filled prosthesis with a combination of local morcellized autograft bone that we obtained during decompression as well as Zimmer DBM. We then inserted the prosthesis into the distracted interspace at C3-4 and C4-5. We then removed the distraction screws. There was a good snug fit of the prosthesis in the interspace.  Having completed the fusion we now turned attention to the anterior spinal instrumentation. We used the high-speed drill to drill away some anterior spondylosis at the disc spaces so that the plate lay down flat. We selected the appropriate length titanium anterior cervical plate. We laid it along the anterior aspect of the vertebral bodies from C3-C5. We then drilled 14 mm holes at C3, C4 and C5. We then secured the plate to the vertebral bodies by placing two 14 mm self-tapping screws at C3, C4 and C5. We then obtained intraoperative radiograph. The demonstrating good position of the instrumentation. We therefore secured the screws the plate the locking each cam. This completed the instrumentation.  We then obtained hemostasis using bipolar electrocautery. We irrigated the wound out with bacitracin solution. We then removed the retractor. We inspected the esophagus for any damage. There was none apparent.  I placed a 10 mm flat Jackson-Pratt drain in the prevertebral space and tunneled it out through a separate stab wound.  We then reapproximated patient's platysmal muscle with interrupted 3-0 Vicryl suture. We then reapproximated the subcutaneous tissue with interrupted 3-0 Vicryl suture. The skin was reapproximated with Steri-Strips and benzoin. The wound was then covered with bacitracin ointment. A sterile dressing was applied. The drapes were removed. Patient was subsequently extubated by the anesthesia team and transported to the post anesthesia care unit in stable condition. All sponge instrument and  needle counts were reportedly correct at the end of this case.

## 2022-05-22 NOTE — Transfer of Care (Signed)
Immediate Anesthesia Transfer of Care Note  Patient: Jim Ramirez  Procedure(s) Performed: ANTERIOR CERVICAL DISCECTOMY FUSION, INTERBODY PROSTHESIS,PLATE/SCREWS CERVICAL THREE-FOUR, CERVICAL FOUR-FIVE;REMOVAL CERVICAL PLATE  Patient Location: PACU  Anesthesia Type:General  Level of Consciousness: awake, alert , and oriented  Airway & Oxygen Therapy: Patient Spontanous Breathing  Post-op Assessment: Report given to RN and Post -op Vital signs reviewed and stable  Post vital signs: Reviewed and stable  Last Vitals:  Vitals Value Taken Time  BP 154/66 05/22/22 1909  Temp    Pulse 78 05/22/22 1910  Resp 15 05/22/22 1910  SpO2 92 % 05/22/22 1910  Vitals shown include unvalidated device data.  Last Pain:  Vitals:   05/22/22 1206  PainSc: 5       Patients Stated Pain Goal: 3 (41/58/30 9407)  Complications: No notable events documented.

## 2022-05-22 NOTE — Anesthesia Postprocedure Evaluation (Signed)
Anesthesia Post Note  Patient: Jim Ramirez  Procedure(s) Performed: ANTERIOR CERVICAL DISCECTOMY FUSION, INTERBODY PROSTHESIS,PLATE/SCREWS CERVICAL THREE-FOUR, CERVICAL FOUR-FIVE;REMOVAL CERVICAL PLATE     Patient location during evaluation: PACU Anesthesia Type: General Level of consciousness: awake and alert Pain management: pain level controlled Vital Signs Assessment: post-procedure vital signs reviewed and stable Respiratory status: spontaneous breathing, nonlabored ventilation, respiratory function stable and patient connected to nasal cannula oxygen Cardiovascular status: blood pressure returned to baseline and stable Postop Assessment: no apparent nausea or vomiting Anesthetic complications: no   No notable events documented.  Last Vitals:  Vitals:   05/22/22 1905 05/22/22 1957  BP: (!) 154/66 (!) 153/76  Pulse: 80 72  Resp: 14 18  Temp: 37.2 C 36.7 C  SpO2: 92% 94%    Last Pain:  Vitals:   05/22/22 2000  TempSrc:   PainSc: 2                  March Rummage Lakie Mclouth
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
## 2022-05-22 NOTE — Progress Notes (Signed)
Orthopedic Tech Progress Note Patient Details:  JEREMAIH KLIMA 1950-11-10 [id number]  Ortho Devices Type of Ortho Device: Aspen cervical collar Ortho Device/Splint Location: Neck Ortho Device/Splint Interventions: [place] Isobel Eisenhuth 05/22/2022, 7:22 PM

## 2022-05-22 NOTE — Anesthesia Procedure Notes (Signed)
Procedure Name: Intubation Date/Time: 05/22/2022 3:34 PM  Performed by: Glynda Jaeger, CRNAPre-anesthesia Checklist: Patient identified, Patient being monitored, Timeout performed, Emergency Drugs available and Suction available Patient Re-evaluated:Patient Re-evaluated prior to induction Oxygen Delivery Method: Circle System Utilized Preoxygenation: Pre-oxygenation with 100% oxygen Induction Type: IV induction Ventilation: Mask ventilation without difficulty Laryngoscope Size: Glidescope and 4 Grade View: Grade I Tube type: Oral Tube size: 7.5 mm Number of attempts: 1 Airway Equipment and Method: Stylet Placement Confirmation: ETT inserted through vocal cords under direct vision, positive ETCO2 and breath sounds checked- equal and bilateral Secured at: 23 cm Tube secured with: Tape Dental Injury: Teeth and Oropharynx as per pre-operative assessment

## 2022-05-23 ENCOUNTER — Encounter (HOSPITAL_COMMUNITY): Payer: Self-pay | Admitting: Neurosurgery

## 2022-05-23 LAB — GLUCOSE, CAPILLARY
Glucose-Capillary: 227 mg/dL — ABNORMAL HIGH (ref 70–99)
Glucose-Capillary: 79 mg/dL (ref 70–99)

## 2022-05-23 MED ORDER — DOCUSATE SODIUM 100 MG PO CAPS: 100.0000 mg | ORAL_CAPSULE | Freq: Two times a day (BID) | ORAL | 0 refills | Status: DC

## 2022-05-23 MED ORDER — CYCLOBENZAPRINE HCL 5 MG PO TABS: 5.0000 mg | ORAL_TABLET | Freq: Three times a day (TID) | ORAL | 0 refills | Status: DC | PRN

## 2022-05-23 MED ORDER — OXYCODONE-ACETAMINOPHEN 5-325 MG PO TABS: 1.0000 | ORAL_TABLET | ORAL | 0 refills | Status: DC | PRN

## 2022-05-23 NOTE — Discharge Summary (Addendum)
Physician Discharge Summary     Providing Compassionate, Quality Care - Together   Patient ID: Jim Ramirez MRN: [id number] DOB/AGE: 1950/08/08 72 y.o.  Admit date: 05/22/2022 Discharge date: 05/23/2022  Admission Diagnoses: Cervical spondylosis with myelopathy and radiculopathy  Discharge Diagnoses:  Principal Problem:   Cervical spondylosis with myelopathy and radiculopathy   Discharged Condition: good  Hospital Course: Patient underwent a C3-4, C4-5 ACDF by Dr. Arnoldo Morale on 05/22/2022. He was admitted to 3C06  following recovery from anesthesia in the PACU. His postoperative course has been uncomplicated. He has worked with both physical and occupational therapies who feel the patient is ready for discharge home. He is ambulating independently and without difficulty. He is tolerating a normal diet. He is not having any bowel or bladder dysfunction. His pain is well-controlled with oral pain medication. He is ready for discharge home.   Consults: OT  Significant Diagnostic Studies: radiology: DG Cervical Spine 2 or 3 views  Result Date: 05/22/2022 CLINICAL DATA:  Anterior cervical discectomy with interbody prosthesis. EXAM: CERVICAL SPINE - 2-3 VIEW COMPARISON:  03/23/2022. FINDINGS: To lateral cross-table portable images of the cervical spine were obtained for anterior cervical discectomy with interbody fusion. Cervical spinal fusion hardware is noted at C3-C4 and C4-C5. Please see operative report for additional information. IMPRESSION: Portable radiographs of the cervical spine were obtained for anterior cervical discectomy and fusion. Please see operative report for additional information. Electronically Signed   By: Brett Fairy M.D.   On: 05/22/2022 20:40     Treatments: surgery:  C3-4 and C4-5 anterior cervical discectomy/decompression; C3-4 and C4-5 interbody arthrodesis with local morcellized autograft bone and Zimmer DBM; insertion of interbody prosthesis at C3-4 and  C4-5 (DePuy titanium interbody prosthesis); anterior cervical plating from C3-C5 with DePuy titanium plate; exploration of cervical fusion/removal of cervical hardware    Discharge Exam: Blood pressure (!) 141/73, pulse 77, temperature 97.6 F (36.4 C), temperature source Oral, resp. rate 18, height 6' (1.829 m), weight 98.9 kg, SpO2 98 %.  Alert and oriented x 4 PERRLA CN II-XII grossly intact MAE, Strength and sensation intact Incision is covered with Honeycomb dressing and Steri Strips; Dressing is dry and intact, with a small amount of dried sanguinous drainage.   Disposition: Discharge disposition: 01-Home or Self Care       Discharge Instructions     Call MD for:  difficulty breathing, headache or visual disturbances   Complete by: As directed    Call MD for:  persistant nausea and vomiting   Complete by: As directed    Call MD for:  redness, tenderness, or signs of infection (pain, swelling, redness, odor or green/yellow discharge around incision site)   Complete by: As directed    Call MD for:  severe uncontrolled pain   Complete by: As directed    Call MD for:  temperature >100.4   Complete by: As directed    Diet - low sodium heart healthy   Complete by: As directed    Increase activity slowly   Complete by: As directed    Remove dressing in 24 hours   Complete by: As directed       Allergies as of 05/23/2022   No Known Allergies      Medication List     STOP taking these medications    HYDROcodone-acetaminophen 10-325 MG tablet Commonly known as: NORCO       TAKE these medications    azelastine 0.1 % nasal spray Commonly known as: ASTELIN  Place 1 spray into both nostrils 2 (two) times daily. Use in each nostril as directed   celecoxib 200 MG capsule Commonly known as: CELEBREX Take 200 mg by mouth daily.   Clobetasol Propionate 0.05 % lotion Apply 1 Application topically 2 (two) times daily as needed (psoriasis).   cyanocobalamin 1000  MCG/ML injection Commonly known as: VITAMIN B12 Inject 1,000 mcg into the muscle every 30 (thirty) days.   cyclobenzaprine 5 MG tablet Commonly known as: FLEXERIL Take 1 tablet (5 mg total) by mouth 3 (three) times daily as needed for muscle spasms.   docusate sodium 100 MG capsule Commonly known as: COLACE Take 1 capsule (100 mg total) by mouth 2 (two) times daily.   empagliflozin 25 MG Tabs tablet Commonly known as: JARDIANCE Take 25 mg by mouth daily.   escitalopram 10 MG tablet Commonly known as: LEXAPRO Take 10 mg by mouth daily.   esomeprazole 40 MG capsule Commonly known as: NEXIUM Take 40 mg by mouth 2 (two) times daily.   hydrochlorothiazide 25 MG tablet Commonly known as: HYDRODIURIL Take 25 mg by mouth daily.   insulin NPH-regular Human (70-30) 100 UNIT/ML injection Inject 20-50 Units into the skin See admin instructions. Inject 50 units in the morning and 20 units in the evening   levocetirizine 5 MG tablet Commonly known as: XYZAL Take 5 mg by mouth every evening.   metFORMIN 1000 MG tablet Commonly known as: GLUCOPHAGE Take 1,000 mg by mouth 2 (two) times daily with a meal.   metoprolol succinate 50 MG 24 hr tablet Commonly known as: TOPROL-XL Take 50 mg by mouth daily.   mometasone 0.1 % ointment Commonly known as: ELOCON Apply 1 Application topically 2 (two) times daily as needed for rash.   oxyCODONE-acetaminophen 5-325 MG tablet Commonly known as: Percocet Take 1-2 tablets by mouth every 4 (four) hours as needed for severe pain.   rosuvastatin 20 MG tablet Commonly known as: CRESTOR Take 20 mg by mouth daily.   tamsulosin 0.4 MG Caps capsule Commonly known as: FLOMAX Take 0.4 mg by mouth daily.   trandolapril 2 MG tablet Commonly known as: MAVIK Take 2 mg by mouth daily.   zolpidem 10 MG tablet Commonly known as: AMBIEN Take 20 mg by mouth at bedtime.        Follow-up Information     Tressie Stalker, MD Follow up.    Specialty: Neurosurgery Contact information: [address] [place] [address] [phone]                 Signed: Val Eagle, DNP, AGNP-C Nurse Practitioner  [facility] [address], [address], [place], [place] [address] P: [phone]    F: [phone]  05/23/2022, 11:42 AM

## 2022-05-23 NOTE — Progress Notes (Signed)
Patient alert and oriented, mae's well, voiding adequate amount of urine, swallowing without difficulty, no c/o pain at time of discharge. Patient discharged home with family. Script and discharged instructions given to patient. Patient and family stated understanding of instructions given. Patient has an appointment with Dr. Jenkins   

## 2022-05-23 NOTE — Inpatient Diabetes Management (Signed)
Inpatient Diabetes Program Recommendations  AACE/ADA: New Consensus Statement on Inpatient Glycemic Control (2015)  Target Ranges:  Prepandial:   less than 140 mg/dL      Peak postprandial:   less than 180 mg/dL (1-2 hours)      Critically ill patients:  140 - 180 mg/dL   Lab Results  Component Value Date   GLUCAP 227 (H) 05/23/2022   HGBA1C 8.0 (H) 05/01/2022    Review of Glycemic Control  Latest Reference Range & Units 05/22/22 11:32 05/22/22 13:52 05/22/22 19:03 05/22/22 21:20 05/23/22 06:38  Glucose-Capillary 70 - 99 mg/dL 155 (H) 143 (H) 237 (H) 336 (H) 227 (H)  (H): Data is abnormally high  Diabetes history: DM2  Outpatient Diabetes medications:  70/30 50 units QAM, 20 units QPM Jardiance 25 mg QD Metformin 1000 mg BID  Current orders for Inpatient glycemic control:  Novolog 0-20 units TID and 0-5 units QHS Metformin 1000 mg BID Jardiance 25 mg QD  Inpatient Diabetes Program Recommendations:    Levemir 12 units BID (50% of home basal dose) Novolog 3 units TID with meals if consumes at least 50%  Will continue to follow while inpatient.  Thank you, Reche Dixon, MSN, [place] Diabetes Coordinator Inpatient Diabetes Program [phone] (team pager from 8a-5p)
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
## 2022-05-23 NOTE — Discharge Instructions (Signed)
Wound Care Keep incision covered and dry for two days.    Do not put any creams, lotions, or ointments on incision. Leave steri-strips on back.  They will fall off by themselves. You are fine to shower. Let water run over incision and pat dry.  Activity Walk each and every day, increasing distance each day. No lifting greater than 5 lbs.  Avoid excessive neck motion. No driving for 2 weeks; may ride as a passenger locally.  Diet Resume your normal diet.   Return to Work Will be discussed at your follow up appointment.  Call Your Doctor If Any of These Occur Redness, drainage, or swelling at the wound.  Temperature greater than 101 degrees. Severe pain not relieved by pain medication. Incision starts to come apart.  Follow Up Appt Call [phone] today for appointment in 2-3 weeks if you don't already have one or for any problems.  If you have any hardware placed in your spine, you will need an x-ray before your appointment.  

## 2022-06-11 DIAGNOSIS — M4712 Other spondylosis with myelopathy, cervical region: Secondary | ICD-10-CM | POA: Diagnosis not present

## 2022-06-11 DIAGNOSIS — M542 Cervicalgia: Secondary | ICD-10-CM | POA: Diagnosis not present

## 2022-06-19 DIAGNOSIS — Z79899 Other long term (current) drug therapy: Secondary | ICD-10-CM | POA: Diagnosis not present

## 2022-06-19 DIAGNOSIS — R0602 Shortness of breath: Secondary | ICD-10-CM | POA: Diagnosis not present

## 2022-06-19 DIAGNOSIS — Z299 Encounter for prophylactic measures, unspecified: Secondary | ICD-10-CM | POA: Diagnosis not present

## 2022-06-19 DIAGNOSIS — M542 Cervicalgia: Secondary | ICD-10-CM | POA: Diagnosis not present

## 2022-06-19 DIAGNOSIS — E538 Deficiency of other specified B group vitamins: Secondary | ICD-10-CM | POA: Diagnosis not present

## 2022-06-19 DIAGNOSIS — I10 Essential (primary) hypertension: Secondary | ICD-10-CM | POA: Diagnosis not present

## 2022-06-19 DIAGNOSIS — Z6830 Body mass index (BMI) 30.0-30.9, adult: Secondary | ICD-10-CM | POA: Diagnosis not present

## 2022-06-24 DIAGNOSIS — R0602 Shortness of breath: Secondary | ICD-10-CM | POA: Diagnosis not present

## 2022-07-07 DIAGNOSIS — R0600 Dyspnea, unspecified: Secondary | ICD-10-CM | POA: Diagnosis not present

## 2022-07-07 DIAGNOSIS — Z794 Long term (current) use of insulin: Secondary | ICD-10-CM | POA: Diagnosis not present

## 2022-07-07 DIAGNOSIS — R7989 Other specified abnormal findings of blood chemistry: Secondary | ICD-10-CM | POA: Diagnosis not present

## 2022-07-07 DIAGNOSIS — I10 Essential (primary) hypertension: Secondary | ICD-10-CM | POA: Diagnosis not present

## 2022-07-07 DIAGNOSIS — R0981 Nasal congestion: Secondary | ICD-10-CM | POA: Diagnosis not present

## 2022-07-07 DIAGNOSIS — Z20822 Contact with and (suspected) exposure to covid-19: Secondary | ICD-10-CM | POA: Diagnosis not present

## 2022-07-07 DIAGNOSIS — Z7951 Long term (current) use of inhaled steroids: Secondary | ICD-10-CM | POA: Diagnosis not present

## 2022-07-07 DIAGNOSIS — E119 Type 2 diabetes mellitus without complications: Secondary | ICD-10-CM | POA: Diagnosis not present

## 2022-07-07 DIAGNOSIS — R059 Cough, unspecified: Secondary | ICD-10-CM | POA: Diagnosis not present

## 2022-07-07 DIAGNOSIS — R0602 Shortness of breath: Secondary | ICD-10-CM | POA: Diagnosis not present

## 2022-07-07 DIAGNOSIS — J101 Influenza due to other identified influenza virus with other respiratory manifestations: Secondary | ICD-10-CM | POA: Diagnosis not present

## 2022-07-07 DIAGNOSIS — Z7984 Long term (current) use of oral hypoglycemic drugs: Secondary | ICD-10-CM | POA: Diagnosis not present

## 2022-07-07 DIAGNOSIS — Z87891 Personal history of nicotine dependence: Secondary | ICD-10-CM | POA: Diagnosis not present

## 2022-07-11 DIAGNOSIS — Z6830 Body mass index (BMI) 30.0-30.9, adult: Secondary | ICD-10-CM | POA: Diagnosis not present

## 2022-07-11 DIAGNOSIS — Z299 Encounter for prophylactic measures, unspecified: Secondary | ICD-10-CM | POA: Diagnosis not present

## 2022-07-11 DIAGNOSIS — Z713 Dietary counseling and surveillance: Secondary | ICD-10-CM | POA: Diagnosis not present

## 2022-07-11 DIAGNOSIS — R6889 Other general symptoms and signs: Secondary | ICD-10-CM | POA: Diagnosis not present

## 2022-07-18 DIAGNOSIS — Z6830 Body mass index (BMI) 30.0-30.9, adult: Secondary | ICD-10-CM | POA: Diagnosis not present

## 2022-07-18 DIAGNOSIS — E538 Deficiency of other specified B group vitamins: Secondary | ICD-10-CM | POA: Diagnosis not present

## 2022-07-18 DIAGNOSIS — R059 Cough, unspecified: Secondary | ICD-10-CM | POA: Diagnosis not present

## 2022-07-18 DIAGNOSIS — I10 Essential (primary) hypertension: Secondary | ICD-10-CM | POA: Diagnosis not present

## 2022-07-18 DIAGNOSIS — E1165 Type 2 diabetes mellitus with hyperglycemia: Secondary | ICD-10-CM | POA: Diagnosis not present

## 2022-07-18 DIAGNOSIS — M542 Cervicalgia: Secondary | ICD-10-CM | POA: Diagnosis not present

## 2022-07-18 DIAGNOSIS — R0602 Shortness of breath: Secondary | ICD-10-CM | POA: Diagnosis not present

## 2022-07-18 DIAGNOSIS — Z299 Encounter for prophylactic measures, unspecified: Secondary | ICD-10-CM | POA: Diagnosis not present

## 2022-08-20 DIAGNOSIS — I10 Essential (primary) hypertension: Secondary | ICD-10-CM | POA: Diagnosis not present

## 2022-08-20 DIAGNOSIS — E538 Deficiency of other specified B group vitamins: Secondary | ICD-10-CM | POA: Diagnosis not present

## 2022-08-20 DIAGNOSIS — R040 Epistaxis: Secondary | ICD-10-CM | POA: Diagnosis not present

## 2022-08-20 DIAGNOSIS — Z299 Encounter for prophylactic measures, unspecified: Secondary | ICD-10-CM | POA: Diagnosis not present

## 2022-08-20 DIAGNOSIS — R0602 Shortness of breath: Secondary | ICD-10-CM | POA: Diagnosis not present

## 2022-08-20 DIAGNOSIS — E1142 Type 2 diabetes mellitus with diabetic polyneuropathy: Secondary | ICD-10-CM | POA: Diagnosis not present

## 2022-08-20 DIAGNOSIS — E1165 Type 2 diabetes mellitus with hyperglycemia: Secondary | ICD-10-CM | POA: Diagnosis not present

## 2022-08-22 ENCOUNTER — Encounter: Payer: Self-pay | Admitting: Cardiology

## 2022-08-22 ENCOUNTER — Ambulatory Visit: Payer: Medicare HMO | Attending: Cardiology | Admitting: Cardiology

## 2022-08-22 VITALS — BP 150/80 | HR 70 | Ht 72.0 in | Wt 206.0 lb

## 2022-08-22 DIAGNOSIS — R0609 Other forms of dyspnea: Secondary | ICD-10-CM | POA: Diagnosis not present

## 2022-08-22 NOTE — Progress Notes (Signed)
Clinical Summary Jim Ramirez is a 72 y.o.male seen as a new consult, referred by Dr Manuella Ghazi for the following medical problems.   1.DOE - walking 13 miles regularly back in October - DOE at 1/2 mile now, gradual decline over the last few months -did have surgery in October on his neck, reports however he did not have any prolonged period of inactivity - diaphoretic walking over the last few years - pcp has checked echo, we are calling for report  06/2022 CXR mild diffuse opacities (flu at the time) 06/2022 CTA: No evidence of pulmonary emboli.  Mild left basilar atelectasis.  No acute abnormality is noted.    - no chest pains - no recent edema.  05/2022 echo with pcp: LVEF 55-60%, grade I dd  2.HTN - home bp's 118/75  Past Medical History:  Diagnosis Date   Acute spontaneous subarachnoid intracranial hemorrhage ([place]) 02/2017   CTA negative for aneurysm   Depressed    Diabetes mellitus without complication (HCC)    DVT (deep venous thrombosis) (HCC)    History of kidney stones    Hypertension    Nephrolithiasis      No Known Allergies   Current Outpatient Medications  Medication Sig Dispense Refill   azelastine (ASTELIN) 0.1 % nasal spray Place 1 spray into both nostrils 2 (two) times daily. Use in each nostril as directed     celecoxib (CELEBREX) 200 MG capsule Take 200 mg by mouth daily.     Clobetasol Propionate 0.05 % lotion Apply 1 Application topically 2 (two) times daily as needed (psoriasis).     cyanocobalamin (VITAMIN B12) 1000 MCG/ML injection Inject 1,000 mcg into the muscle every 30 (thirty) days.     cyclobenzaprine (FLEXERIL) 5 MG tablet Take 1 tablet (5 mg total) by mouth 3 (three) times daily as needed for muscle spasms. 30 tablet 0   docusate sodium (COLACE) 100 MG capsule Take 1 capsule (100 mg total) by mouth 2 (two) times daily. 10 capsule 0   empagliflozin (JARDIANCE) 25 MG TABS tablet Take 25 mg by mouth daily.     escitalopram (LEXAPRO) 10  MG tablet Take 10 mg by mouth daily.     esomeprazole (NEXIUM) 40 MG capsule Take 40 mg by mouth 2 (two) times daily.     hydrochlorothiazide (HYDRODIURIL) 25 MG tablet Take 25 mg by mouth daily.     insulin NPH-regular Human (70-30) 100 UNIT/ML injection Inject 20-50 Units into the skin See admin instructions. Inject 50 units in the morning and 20 units in the evening     levocetirizine (XYZAL) 5 MG tablet Take 5 mg by mouth every evening.     metFORMIN (GLUCOPHAGE) 1000 MG tablet Take 1,000 mg by mouth 2 (two) times daily with a meal.     metoprolol succinate (TOPROL-XL) 50 MG 24 hr tablet Take 50 mg by mouth daily.     mometasone (ELOCON) 0.1 % ointment Apply 1 Application topically 2 (two) times daily as needed for rash.     oxyCODONE-acetaminophen (PERCOCET) 5-325 MG tablet Take 1-2 tablets by mouth every 4 (four) hours as needed for severe pain. 30 tablet 0   rosuvastatin (CRESTOR) 20 MG tablet Take 20 mg by mouth daily.     tamsulosin (FLOMAX) 0.4 MG CAPS capsule Take 0.4 mg by mouth daily.     trandolapril (MAVIK) 2 MG tablet Take 2 mg by mouth daily.     zolpidem (AMBIEN) 10 MG tablet Take 20 mg  by mouth at bedtime.     No current facility-administered medications for this visit.     Past Surgical History:  Procedure Laterality Date   ANTERIOR CERVICAL DECOMP/DISCECTOMY FUSION N/A 05/22/2022   Procedure: ANTERIOR CERVICAL DISCECTOMY FUSION, INTERBODY PROSTHESIS,PLATE/SCREWS CERVICAL THREE-FOUR, CERVICAL FOUR-FIVE;REMOVAL CERVICAL PLATE;  Surgeon: Newman Pies, MD;  Location: [facility];  Service: Neurosurgery;  Laterality: N/A;  3C   STOMACH SURGERY     TONSILLECTOMY  2000     No Known Allergies    Family History  Problem Relation Age of Onset   Heart attack Mother    Heart attack Father    Hypertension Brother    Diabetes Brother    Coronary artery disease Brother      Social History Jim Ramirez reports that he quit smoking about 30 years ago. His smoking use  included cigarettes. He has never been exposed to tobacco smoke. He has never used smokeless tobacco. Jim Ramirez reports that he does not currently use alcohol.   Review of Systems CONSTITUTIONAL: No weight loss, fever, chills, weakness or fatigue.  HEENT: Eyes: No visual loss, blurred vision, double vision or yellow sclerae.No hearing loss, sneezing, congestion, runny nose or sore throat.  SKIN: No rash or itching.  CARDIOVASCULAR: per hpi RESPIRATORY: No shortness of breath, cough or sputum.  GASTROINTESTINAL: No anorexia, nausea, vomiting or diarrhea. No abdominal pain or blood.  GENITOURINARY: No burning on urination, no polyuria NEUROLOGICAL: No headache, dizziness, syncope, paralysis, ataxia, numbness or tingling in the extremities. No change in bowel or bladder control.  MUSCULOSKELETAL: No muscle, back pain, joint pain or stiffness.  LYMPHATICS: No enlarged nodes. No history of splenectomy.  PSYCHIATRIC: No history of depression or anxiety.  ENDOCRINOLOGIC: No reports of sweating, cold or heat intolerance. No polyuria or polydipsia.  Marland Kitchen   Physical Examination Today's Vitals   08/22/22 1013 08/22/22 1041  BP: (!) 148/78 (!) 150/80  Pulse: 70   SpO2: 95%   Weight: 206 lb (93.4 kg)   Height: 6' (1.829 m)    Body mass index is 27.94 kg/m.  Gen: resting comfortably, no acute distress HEENT: no scleral icterus, pupils equal round and reactive, no palptable cervical adenopathy,  CV: RRR, no m/rg, no jvd Resp: Clear to auscultation bilaterally GI: abdomen is soft, non-tender, non-distended, normal bowel sounds, no hepatosplenomegaly MSK: extremities are warm, no edema.  Skin: warm, no rash Neuro:  no focal deficits Psych: appropriate affect      Assessment and Plan   1.DOE -significant DOE over the last few months - request echo results from pcp - would anticipate obtaining exercise nuclear stress test pending echo results - if benign cardiac workup then consider  PFTs -baseline EKG SR, RBBB     Arnoldo Lenis, M.D.

## 2022-08-22 NOTE — Addendum Note (Signed)
Addended by: Berlinda Last on: 08/22/2022 02:22 PM   Modules accepted: Orders

## 2022-08-22 NOTE — Patient Instructions (Signed)
Medication Instructions:  Your physician recommends that you continue on your current medications as directed. Please refer to the Current Medication list given to you today.   Labwork: None  Testing/Procedures: Possible Stress Test  Follow-Up: Follow up is pending.   Any Other Special Instructions Will Be Listed Below (If Applicable).     If you need a refill on your cardiac medications before your next appointment, please call your pharmacy.

## 2022-08-28 ENCOUNTER — Ambulatory Visit (HOSPITAL_COMMUNITY)
Admission: RE | Admit: 2022-08-28 | Discharge: 2022-08-28 | Disposition: A | Discharge status: Home / Self Care | Payer: Medicare HMO | Source: Ambulatory Visit | Attending: Cardiology | Admitting: Cardiology

## 2022-08-28 ENCOUNTER — Encounter (HOSPITAL_COMMUNITY)
Admission: RE | Admit: 2022-08-28 | Discharge: 2022-08-28 | Disposition: A | Discharge status: Home / Self Care | Payer: Medicare HMO | Source: Ambulatory Visit | Attending: Cardiology | Admitting: Cardiology

## 2022-08-28 ENCOUNTER — Encounter (HOSPITAL_COMMUNITY): Payer: Self-pay

## 2022-08-28 DIAGNOSIS — R0609 Other forms of dyspnea: Secondary | ICD-10-CM

## 2022-08-28 LAB — NM MYOCAR MULTI W/SPECT W/WALL MOTION / EF
Angina Index: 0
Base ST Depression (mm): 0 mm
Duke Treadmill Score: 6
Estimated workload: 7
Exercise duration (min): 6 min
Exercise duration (sec): 6 s
LV dias vol: 92 mL (ref 62–150)
LV sys vol: 32 mL
MPHR: 151 {beats}/min
Nuc Stress EF: 65 %
Peak HR: 133 {beats}/min
Percent HR: 88 %
RATE: 0.4
RPE: 15
Rest HR: 66 {beats}/min
Rest Nuclear Isotope Dose: 10.3 mCi
SDS: 2
SRS: 0
SSS: 2
ST Depression (mm): 0 mm
Stress Nuclear Isotope Dose: 30.3 mCi
TID: 0.88

## 2022-08-28 MED ORDER — REGADENOSON 0.4 MG/5ML IV SOLN
INTRAVENOUS | Status: AC
  Filled 2022-08-28: qty 5

## 2022-08-28 MED ORDER — SODIUM CHLORIDE FLUSH 0.9 % IV SOLN
INTRAVENOUS | Status: AC
  Administered 2022-08-28: 10 mL via INTRAVENOUS
  Filled 2022-08-28: qty 10

## 2022-08-28 MED ORDER — TECHNETIUM TC 99M TETROFOSMIN IV KIT
30.0000 | PACK | Freq: Once | INTRAVENOUS | Status: AC | PRN
  Administered 2022-08-28: 30.3 via INTRAVENOUS

## 2022-08-28 MED ORDER — TECHNETIUM TC 99M TETROFOSMIN IV KIT
10.0000 | PACK | Freq: Once | INTRAVENOUS | Status: AC | PRN
  Administered 2022-08-28: 10.3 via INTRAVENOUS

## 2022-09-02 ENCOUNTER — Telehealth: Payer: Self-pay | Admitting: Cardiology

## 2022-09-02 DIAGNOSIS — H11153 Pinguecula, bilateral: Secondary | ICD-10-CM | POA: Diagnosis not present

## 2022-09-02 DIAGNOSIS — R0609 Other forms of dyspnea: Secondary | ICD-10-CM

## 2022-09-02 DIAGNOSIS — H01002 Unspecified blepharitis right lower eyelid: Secondary | ICD-10-CM | POA: Diagnosis not present

## 2022-09-02 DIAGNOSIS — H01004 Unspecified blepharitis left upper eyelid: Secondary | ICD-10-CM | POA: Diagnosis not present

## 2022-09-02 DIAGNOSIS — H25813 Combined forms of age-related cataract, bilateral: Secondary | ICD-10-CM | POA: Diagnosis not present

## 2022-09-02 DIAGNOSIS — H01005 Unspecified blepharitis left lower eyelid: Secondary | ICD-10-CM | POA: Diagnosis not present

## 2022-09-02 DIAGNOSIS — H01001 Unspecified blepharitis right upper eyelid: Secondary | ICD-10-CM | POA: Diagnosis not present

## 2022-09-02 NOTE — Telephone Encounter (Signed)
Patient is requesting a call back to discuss stress test results. 

## 2022-09-02 NOTE — Telephone Encounter (Signed)
  Arnoldo Lenis, MD 09/01/2022 10:37 AM EST     Normal stress test, no evidence of any blockages. Heart testing has been normal, no evidence of any heart issues that would cause symptoms. Can we order PFTs for SOB.   Zandra Abts MD

## 2022-09-02 NOTE — Telephone Encounter (Signed)
Pt notified and verbalized understanding. Pt had no questions or concerns at this time.  

## 2022-09-04 ENCOUNTER — Telehealth: Payer: Self-pay

## 2022-09-04 NOTE — Telephone Encounter (Signed)
-----   Message from Laurine Blazer, LPN sent at 11/04/7528  5:04 PM EST -----  ----- Message ----- From: Arnoldo Lenis, MD Sent: 09/01/2022  10:37 AM EST To: Laurine Blazer, LPN  Normal stress test, no evidence of any blockages. Heart testing has been normal, no evidence of any heart issues that would cause symptoms. Can we order PFTs for SOB.   Zandra Abts MD

## 2022-09-04 NOTE — Telephone Encounter (Signed)
Patient notified, order for pft's already placed,copied pcp

## 2022-09-05 ENCOUNTER — Encounter (HOSPITAL_COMMUNITY)
Admission: RE | Admit: 2022-09-05 | Discharge: 2022-09-05 | Disposition: A | Discharge status: Home / Self Care | Payer: Medicare HMO | Source: Ambulatory Visit | Attending: Ophthalmology | Admitting: Ophthalmology

## 2022-09-05 HISTORY — DX: Sleep apnea, unspecified: G47.30

## 2022-09-06 ENCOUNTER — Encounter (HOSPITAL_COMMUNITY): Payer: Self-pay

## 2022-09-06 NOTE — H&P (Addendum)
Surgical History & Physical  Patient Name: Jim Ramirez DOB: 1951/05/14  Surgery: Cataract extraction with intraocular lens implant phacoemulsification; Left Eye  Surgeon: Baruch Goldmann MD Surgery Date:  09-23-22 Pre-Op Date:  09-02-22  HPI: A 13 Yr. old male patient is referred by Dr Hassell Done for cataract eval. 1. 1. The patient complains of difficulty when driving/seeing small captions on TV, which began 3 years ago. Both eyes are affected. The episode is gradual. The condition's severity increased since last visit. Symptoms occur when the patient is driving, inside, outside and reading. The complaint is associated with glare. This is negatively affecting the patient's quality of life and the patient is unable to function adequately in life with the current level of vision. HPI was performed by Baruch Goldmann .  Medical History: Cataracts Arthritis Diabetes High Blood Pressure LDL anxiety, acid reflux  Review of Systems Endocrine DM All recorded systems are negative except as noted above.  Social   Never smoked   Medication Momestasone, Azelastine, Escitalopram, Hydrochlorothiazide, Hydrocodone, Jardiance, Levocetirizine, Meloxicam, Metformin, Metoprolol, Nexium, Simvastatin, Tamsulosin, Trandolapril, Zolpidem, Novolin 70/30,   Sx/Procedures  None  Drug Allergies   NKDA  History & Physical: Heent:  cataract, left eye NECK: supple without bruits LUNGS: lungs clear to auscultation CV: regular rate and rhythm Abdomen: soft and non-tender Impression & Plan: Assessment: 1.  COMBINED FORMS AGE RELATED CATARACT; Both Eyes (H25.813) 2.  BLEPHARITIS; Right Upper Lid, Right Lower Lid, Left Upper Lid, Left Lower Lid (H01.001, H01.002,H01.004,H01.005) 3.  Pinguecula; Both Eyes (H11.153) 4.  ASTIGMATISM, REGULAR; Both Eyes (H52.223)  Plan: 1.  Cataract accounts for the patient's decreased vision. This visual impairment is not correctable with a tolerable change in glasses or  contact lenses. Cataract surgery with an implantation of a new lens should significantly improve the visual and functional status of the patient. Discussed all risks, benefits, alternatives, and potential complications. Discussed the procedures and recovery. Patient desires to have surgery. A-scan ordered and performed today for intra-ocular lens calculations. The surgery will be performed in order to improve vision for driving, reading, and for eye examinations. Recommend phacoemulsification with intra-ocular lens. Recommend Dextenza for post-operative pain and inflammation. Left Eye non-dominant - first. Dilates poorly - shugarcaine by protocol. Malyugin Ring. Omidira. Toric Lens Vivity vs. Eyhance..  2.  Recommend regular lid cleaning.  3.  Observe; Artificial tears as needed for irritation.  4.  Recommend Toric IOL OU.

## 2022-09-09 DIAGNOSIS — U071 COVID-19: Secondary | ICD-10-CM | POA: Diagnosis not present

## 2022-09-09 DIAGNOSIS — R509 Fever, unspecified: Secondary | ICD-10-CM | POA: Diagnosis not present

## 2022-09-09 DIAGNOSIS — Z299 Encounter for prophylactic measures, unspecified: Secondary | ICD-10-CM | POA: Diagnosis not present

## 2022-09-09 DIAGNOSIS — E1165 Type 2 diabetes mellitus with hyperglycemia: Secondary | ICD-10-CM | POA: Diagnosis not present

## 2022-09-09 MED ORDER — EPINEPHRINE PF 1 MG/ML IJ SOLN
INTRAMUSCULAR | Status: AC
  Filled 2022-09-09: qty 1

## 2022-09-09 MED ORDER — MOXIFLOXACIN HCL 5 MG/ML IO SOLN
INTRAOCULAR | Status: AC
  Filled 2022-09-09: qty 1

## 2022-09-09 MED ORDER — PHENYLEPHRINE-KETOROLAC 1-0.3 % IO SOLN
INTRAOCULAR | Status: AC
  Filled 2022-09-09: qty 4

## 2022-09-16 DIAGNOSIS — H25812 Combined forms of age-related cataract, left eye: Secondary | ICD-10-CM | POA: Diagnosis not present

## 2022-09-17 ENCOUNTER — Telehealth: Payer: Self-pay | Admitting: Cardiology

## 2022-09-17 NOTE — Telephone Encounter (Signed)
We can't schedule PFT's. Scheduler for PFT's called and LVM for them to call patient back. I was told by scheduler this AM they are booking out to [facility] at [facility]. Only do testing here on Tuesday's at [facility].

## 2022-09-17 NOTE — Telephone Encounter (Signed)
Pt is calling regarding some tests that were mentioned during his appt with Dr. Harl Bowie on 08/22/22. Please advise.

## 2022-09-17 NOTE — Telephone Encounter (Signed)
Renea Ee, MD 09/01/2022 10:37 AM EST     Normal stress test, no evidence of any blockages. Heart testing has been normal, no evidence of any heart issues that would cause symptoms. Can we order PFTs for SOB.   Zandra Abts MD

## 2022-09-17 NOTE — Telephone Encounter (Signed)
Orders were placed for PFT and a message sent to the front desk to schedule, however PFTs were not scheduled. Will send a new message to have test scheduled. Patient awaiting call for date/time.

## 2022-09-19 ENCOUNTER — Encounter (HOSPITAL_COMMUNITY)
Admission: RE | Admit: 2022-09-19 | Discharge: 2022-09-19 | Disposition: A | Discharge status: Home / Self Care | Payer: Medicare HMO | Source: Ambulatory Visit | Attending: Ophthalmology | Admitting: Ophthalmology

## 2022-09-19 DIAGNOSIS — E538 Deficiency of other specified B group vitamins: Secondary | ICD-10-CM | POA: Diagnosis not present

## 2022-09-19 DIAGNOSIS — Z299 Encounter for prophylactic measures, unspecified: Secondary | ICD-10-CM | POA: Diagnosis not present

## 2022-09-19 DIAGNOSIS — M542 Cervicalgia: Secondary | ICD-10-CM | POA: Diagnosis not present

## 2022-09-19 DIAGNOSIS — E1142 Type 2 diabetes mellitus with diabetic polyneuropathy: Secondary | ICD-10-CM | POA: Diagnosis not present

## 2022-09-19 DIAGNOSIS — I10 Essential (primary) hypertension: Secondary | ICD-10-CM | POA: Diagnosis not present

## 2022-09-20 DIAGNOSIS — M4712 Other spondylosis with myelopathy, cervical region: Secondary | ICD-10-CM | POA: Diagnosis not present

## 2022-09-23 ENCOUNTER — Encounter (HOSPITAL_COMMUNITY): Payer: Self-pay | Admitting: Ophthalmology

## 2022-09-23 ENCOUNTER — Ambulatory Visit (HOSPITAL_COMMUNITY)
Admission: RE | Admit: 2022-09-23 | Discharge: 2022-09-23 | Disposition: A | Discharge status: Home / Self Care | Payer: Medicare HMO | Source: Ambulatory Visit | Attending: Ophthalmology | Admitting: Ophthalmology

## 2022-09-23 ENCOUNTER — Ambulatory Visit (HOSPITAL_BASED_OUTPATIENT_CLINIC_OR_DEPARTMENT_OTHER): Payer: Medicare HMO | Admitting: Certified Registered"

## 2022-09-23 ENCOUNTER — Encounter (HOSPITAL_COMMUNITY)
Admission: RE | Disposition: A | Discharge status: Home / Self Care | Payer: Self-pay | Source: Ambulatory Visit | Attending: Ophthalmology

## 2022-09-23 ENCOUNTER — Ambulatory Visit (HOSPITAL_COMMUNITY): Payer: Medicare HMO | Admitting: Certified Registered"

## 2022-09-23 DIAGNOSIS — Z87891 Personal history of nicotine dependence: Secondary | ICD-10-CM | POA: Diagnosis not present

## 2022-09-23 DIAGNOSIS — I10 Essential (primary) hypertension: Secondary | ICD-10-CM

## 2022-09-23 DIAGNOSIS — Z7984 Long term (current) use of oral hypoglycemic drugs: Secondary | ICD-10-CM | POA: Insufficient documentation

## 2022-09-23 DIAGNOSIS — Z79899 Other long term (current) drug therapy: Secondary | ICD-10-CM | POA: Insufficient documentation

## 2022-09-23 DIAGNOSIS — Z794 Long term (current) use of insulin: Secondary | ICD-10-CM | POA: Insufficient documentation

## 2022-09-23 DIAGNOSIS — H52223 Regular astigmatism, bilateral: Secondary | ICD-10-CM | POA: Insufficient documentation

## 2022-09-23 DIAGNOSIS — E1136 Type 2 diabetes mellitus with diabetic cataract: Secondary | ICD-10-CM | POA: Insufficient documentation

## 2022-09-23 DIAGNOSIS — H0100B Unspecified blepharitis left eye, upper and lower eyelids: Secondary | ICD-10-CM | POA: Diagnosis not present

## 2022-09-23 DIAGNOSIS — H25812 Combined forms of age-related cataract, left eye: Secondary | ICD-10-CM | POA: Diagnosis not present

## 2022-09-23 DIAGNOSIS — H11153 Pinguecula, bilateral: Secondary | ICD-10-CM | POA: Diagnosis not present

## 2022-09-23 DIAGNOSIS — Z86718 Personal history of other venous thrombosis and embolism: Secondary | ICD-10-CM | POA: Diagnosis not present

## 2022-09-23 DIAGNOSIS — H0100A Unspecified blepharitis right eye, upper and lower eyelids: Secondary | ICD-10-CM | POA: Insufficient documentation

## 2022-09-23 DIAGNOSIS — Z8673 Personal history of transient ischemic attack (TIA), and cerebral infarction without residual deficits: Secondary | ICD-10-CM | POA: Diagnosis not present

## 2022-09-23 DIAGNOSIS — H2181 Floppy iris syndrome: Secondary | ICD-10-CM | POA: Insufficient documentation

## 2022-09-23 DIAGNOSIS — R69 Illness, unspecified: Secondary | ICD-10-CM | POA: Diagnosis not present

## 2022-09-23 DIAGNOSIS — H25813 Combined forms of age-related cataract, bilateral: Secondary | ICD-10-CM | POA: Insufficient documentation

## 2022-09-23 DIAGNOSIS — F32A Depression, unspecified: Secondary | ICD-10-CM | POA: Diagnosis not present

## 2022-09-23 DIAGNOSIS — E1149 Type 2 diabetes mellitus with other diabetic neurological complication: Secondary | ICD-10-CM

## 2022-09-23 DIAGNOSIS — M1990 Unspecified osteoarthritis, unspecified site: Secondary | ICD-10-CM | POA: Diagnosis not present

## 2022-09-23 DIAGNOSIS — G4730 Sleep apnea, unspecified: Secondary | ICD-10-CM | POA: Diagnosis not present

## 2022-09-23 HISTORY — PX: CATARACT EXTRACTION W/PHACO: SHX586

## 2022-09-23 LAB — GLUCOSE, CAPILLARY: Glucose-Capillary: 143 mg/dL — ABNORMAL HIGH (ref 70–99)

## 2022-09-23 SURGERY — PHACOEMULSIFICATION, CATARACT, WITH IOL INSERTION
Anesthesia: Monitor Anesthesia Care | Site: Eye | Laterality: Left

## 2022-09-23 MED ORDER — LIDOCAINE HCL (PF) 1 % IJ SOLN
INTRAOCULAR | Status: DC | PRN
  Administered 2022-09-23: 1 mL via OPHTHALMIC

## 2022-09-23 MED ORDER — TETRACAINE HCL 0.5 % OP SOLN
1.0000 [drp] | OPHTHALMIC | Status: AC | PRN
  Administered 2022-09-23 (×3): 1 [drp] via OPHTHALMIC

## 2022-09-23 MED ORDER — PHENYLEPHRINE-KETOROLAC 1-0.3 % IO SOLN
INTRAOCULAR | Status: AC
  Filled 2022-09-23: qty 4

## 2022-09-23 MED ORDER — TROPICAMIDE 1 % OP SOLN
1.0000 [drp] | OPHTHALMIC | Status: AC | PRN
  Administered 2022-09-23 (×3): 1 [drp] via OPHTHALMIC

## 2022-09-23 MED ORDER — STERILE WATER FOR IRRIGATION IR SOLN
Status: DC | PRN
  Administered 2022-09-23: 25 mL

## 2022-09-23 MED ORDER — NEOMYCIN-POLYMYXIN-DEXAMETH 3.5-10000-0.1 OP SUSP
OPHTHALMIC | Status: DC | PRN
  Administered 2022-09-23: 2 [drp] via OPHTHALMIC

## 2022-09-23 MED ORDER — BSS IO SOLN
INTRAOCULAR | Status: DC | PRN
  Administered 2022-09-23: 15 mL via INTRAOCULAR

## 2022-09-23 MED ORDER — PHENYLEPHRINE-KETOROLAC 1-0.3 % IO SOLN
INTRAOCULAR | Status: DC | PRN
  Administered 2022-09-23: 500 mL via OPHTHALMIC

## 2022-09-23 MED ORDER — LIDOCAINE HCL 3.5 % OP GEL
1.0000 | Freq: Once | OPHTHALMIC | Status: AC
  Administered 2022-09-23: 1 via OPHTHALMIC

## 2022-09-23 MED ORDER — POVIDONE-IODINE 5 % OP SOLN
OPHTHALMIC | Status: DC | PRN
  Administered 2022-09-23: 1 via OPHTHALMIC

## 2022-09-23 MED ORDER — SODIUM HYALURONATE 10 MG/ML IO SOLUTION
PREFILLED_SYRINGE | INTRAOCULAR | Status: DC | PRN
  Administered 2022-09-23: .85 mL via INTRAOCULAR

## 2022-09-23 MED ORDER — PHENYLEPHRINE HCL 2.5 % OP SOLN
1.0000 [drp] | OPHTHALMIC | Status: AC | PRN
  Administered 2022-09-23 (×3): 1 [drp] via OPHTHALMIC

## 2022-09-23 MED ORDER — SODIUM HYALURONATE 23MG/ML IO SOSY
PREFILLED_SYRINGE | INTRAOCULAR | Status: DC | PRN
  Administered 2022-09-23: .6 mL via INTRAOCULAR

## 2022-09-23 MED ORDER — EPINEPHRINE PF 1 MG/ML IJ SOLN
INTRAMUSCULAR | Status: AC
  Filled 2022-09-23: qty 1

## 2022-09-23 SURGICAL SUPPLY — 15 items
CATARACT SUITE SIGHTPATH (MISCELLANEOUS) ×1 IMPLANT
CLOTH BEACON ORANGE TIMEOUT ST (SAFETY) ×1 IMPLANT
EYE SHIELD UNIVERSAL CLEAR (GAUZE/BANDAGES/DRESSINGS) IMPLANT
FEE CATARACT SUITE SIGHTPATH (MISCELLANEOUS) ×1 IMPLANT
GLOVE BIOGEL PI IND STRL 7.0 (GLOVE) ×2 IMPLANT
LENS IOL RAYNER 22.0 (Intraocular Lens) ×1 IMPLANT
LENS IOL RAYONE EMV 22.0 (Intraocular Lens) IMPLANT
NDL HYPO 18GX1.5 BLUNT FILL (NEEDLE) ×1 IMPLANT
NEEDLE HYPO 18GX1.5 BLUNT FILL (NEEDLE) ×1 IMPLANT
PAD ARMBOARD 7.5X6 YLW CONV (MISCELLANEOUS) ×1 IMPLANT
RING MALYGIN 7.0 (MISCELLANEOUS) IMPLANT
SYR TB 1ML LL NO SAFETY (SYRINGE) ×1 IMPLANT
TAPE SURG TRANSPORE 1 IN (GAUZE/BANDAGES/DRESSINGS) IMPLANT
TAPE SURGICAL TRANSPORE 1 IN (GAUZE/BANDAGES/DRESSINGS) ×1
WATER STERILE IRR 250ML POUR (IV SOLUTION) ×1 IMPLANT

## 2022-09-23 NOTE — Discharge Instructions (Addendum)
Please discharge patient when stable, will follow up today with Dr. Wrzosek at the [facility] [place] office immediately following discharge.  Leave shield in place until visit.  All paperwork with discharge instructions will be given at the office.  [facility] [place] Address:  [address]  [place], [place] [address]  

## 2022-09-23 NOTE — Interval H&P Note (Signed)
History and Physical Interval Note:  09/23/2022 10:48 AM  Jim Ramirez  has presented today for surgery, with the diagnosis of combined forms age related cataract; left.  The various methods of treatment have been discussed with the patient and family. After consideration of risks, benefits and other options for treatment, the patient has consented to  Procedure(s) with comments: CATARACT EXTRACTION PHACO AND INTRAOCULAR LENS PLACEMENT (IOC) (Left) - CDE: as a surgical intervention.  The patient's history has been reviewed, patient examined, no change in status, stable for surgery.  I have reviewed the patient's chart and labs.  Questions were answered to the patient's satisfaction.     Baruch Goldmann

## 2022-09-23 NOTE — Op Note (Signed)
Date of procedure: 09/23/22  Pre-operative diagnosis: Visually significant age-related combined form cataract, Left Eye; Poor dilation, Left eye (H25.812)   Post-operative diagnosis: Visually significant age-related cataract, Left Eye; Intra-operative Floppy Iris Syndrome, Left Eye (H21.81)  Procedure: Complex removal of cataract via phacoemulsification and insertion of intra-ocular lens Rayner [id number] +22.0D into the capsular bag of the Left Eye (CPT [phone])  Attending surgeon: Gerda Diss. Haleema Vanderheyden, MD, MA  Anesthesia: MAC, Topical Akten  Complications: None  Estimated Blood Loss: <29m (minimal)  Specimens: None  Implants: As above  Indications:  Visually significant cataract, Left Eye  Procedure:  The patient was seen and identified in the pre-operative area. The operative eye was identified and dilated.  The operative eye was marked.  Topical anesthesia was administered to the operative eye.     The patient was then to the operative suite and placed in the supine position.  A timeout was performed confirming the patient, procedure to be performed, and all other relevant information.   The patient's face was prepped and draped in the usual fashion for intra-ocular surgery.  A lid speculum was placed into the operative eye and the surgical microscope moved into place and focused.  Poor dilation of the iris was confirmed.  An inferotemporal paracentesis was created using a 20 gauge paracentesis blade.  Shugarcaine was injected into the anterior chamber.  Viscoelastic was injected into the anterior chamber.  A temporal clear-corneal main wound incision was created using a 2.462mmicrokeratome.  A Malyugin ring was placed.  A continuous curvilinear capsulorrhexis was initiated using an irrigating cystitome and completed using capsulorrhexis forceps.  Hydrodissection and hydrodeliniation were performed.  Viscoelastic was injected into the anterior chamber.  A phacoemulsification handpiece and a  chopper as a second instrument were used to remove the nucleus and epinucleus. The irrigation/aspiration handpiece was used to remove any remaining cortical material.   The capsular bag was reinflated with viscoelastic, checked, and found to be intact.  The intraocular lens was inserted into the capsular bag and dialed into place using a MaSurveyor, mineralsThe Malyugin ring was removed.  The irrigation/aspiration handpiece was used to remove any remaining viscoelastic.  The clear corneal wound and paracentesis wounds were then hydrated and checked with Weck-Cels to be watertight. Maxitrol drops were instilled into the operative eye. The lid-speculum and drape was removed, and the patient's face was cleaned with a wet and dry 4x4. A clear shield was taped over the eye. The patient was taken to the post-operative care unit in good condition, having tolerated the procedure well.  Post-Op Instructions: [facility] and will receive all other orders and instructions.

## 2022-09-23 NOTE — Anesthesia Preprocedure Evaluation (Signed)
Anesthesia Evaluation  Patient identified by MRN, date of birth, ID band Patient awake    Reviewed: Allergy & Precautions, H&P , NPO status , Patient's Chart, lab work & pertinent test results  Airway Mallampati: II  TM Distance: >3 FB Neck ROM: Full   Comment: ACDF Dental  (+) Dental Advisory Given, Upper Dentures, Lower Dentures   Pulmonary sleep apnea , former smoker   Pulmonary exam normal breath sounds clear to auscultation       Cardiovascular Exercise Tolerance: Good hypertension, Pt. on medications + DVT  Normal cardiovascular exam Rhythm:Regular Rate:Normal     Neuro/Psych  PSYCHIATRIC DISORDERS  Depression     Neuromuscular disease (Cervical spondylosis with myelopathy and radiculopathy) CVA (Acute spontaneous subarachnoid intracranial hemorrhage (HCC))    GI/Hepatic Neg liver ROS, PUD,,,  Endo/Other  diabetes, Well Controlled, Type 2, Oral Hypoglycemic Agents    Renal/GU Renal disease  negative genitourinary   Musculoskeletal  (+) Arthritis , Osteoarthritis,    Abdominal   Peds negative pediatric ROS (+)  Hematology negative hematology ROS (+)   Anesthesia Other Findings   Reproductive/Obstetrics negative OB ROS                             Anesthesia Physical Anesthesia Plan  ASA: 3  Anesthesia Plan: MAC   Post-op Pain Management: Minimal or no pain anticipated   Induction:   PONV Risk Score and Plan: Treatment may vary due to age or medical condition  Airway Management Planned: Nasal Cannula and Natural Airway  Additional Equipment:   Intra-op Plan:   Post-operative Plan:   Informed Consent: I have reviewed the patients History and Physical, chart, labs and discussed the procedure including the risks, benefits and alternatives for the proposed anesthesia with the patient or authorized representative who has indicated his/her understanding and acceptance.      Dental advisory given  Plan Discussed with: CRNA and Surgeon  Anesthesia Plan Comments:         Anesthesia Quick Evaluation

## 2022-09-23 NOTE — Transfer of Care (Signed)
Immediate Anesthesia Transfer of Care Note  Patient: Jim Ramirez  Procedure(s) Performed: CATARACT EXTRACTION PHACO AND INTRAOCULAR LENS PLACEMENT (IOC) (Left: Eye)  Patient Location: Short Stay  Anesthesia Type:MAC  Level of Consciousness: awake, alert , and oriented  Airway & Oxygen Therapy: Patient Spontanous Breathing  Post-op Assessment: Report given to RN and Post -op Vital signs reviewed and stable  Post vital signs: Reviewed and stable  Last Vitals:  Vitals Value Taken Time  BP    Temp    Pulse    Resp    SpO2      Last Pain:  Vitals:   09/23/22 1038  TempSrc: Oral         Complications: No notable events documented.

## 2022-09-23 NOTE — Anesthesia Postprocedure Evaluation (Signed)
Anesthesia Post Note  Patient: Jim Ramirez  Procedure(s) Performed: CATARACT EXTRACTION PHACO AND INTRAOCULAR LENS PLACEMENT (IOC) (Left: Eye)  Patient location during evaluation: Phase II Anesthesia Type: MAC Level of consciousness: awake and alert and oriented Pain management: pain level controlled Vital Signs Assessment: post-procedure vital signs reviewed and stable Respiratory status: spontaneous breathing and respiratory function stable Cardiovascular status: blood pressure returned to baseline and stable Postop Assessment: no apparent nausea or vomiting Anesthetic complications: no  No notable events documented.   Last Vitals:  Vitals:   09/23/22 1038 09/23/22 1120  BP: (!) 175/66 119/68  Pulse: 65 70  Resp: (!) 24   Temp: 36.8 C   SpO2: 97% 98%    Last Pain:  Vitals:   09/23/22 1120  TempSrc:   PainSc: 0-No pain                 Wandra Babin C Nat Lowenthal

## 2022-09-30 ENCOUNTER — Encounter (HOSPITAL_COMMUNITY)
Admission: RE | Admit: 2022-09-30 | Discharge: 2022-09-30 | Disposition: A | Discharge status: Home / Self Care | Payer: Medicare HMO | Source: Ambulatory Visit | Attending: Ophthalmology | Admitting: Ophthalmology

## 2022-09-30 ENCOUNTER — Encounter (HOSPITAL_COMMUNITY): Payer: Self-pay

## 2022-09-30 ENCOUNTER — Other Ambulatory Visit: Payer: Self-pay

## 2022-09-30 DIAGNOSIS — H25811 Combined forms of age-related cataract, right eye: Secondary | ICD-10-CM | POA: Diagnosis not present

## 2022-10-01 ENCOUNTER — Encounter (HOSPITAL_COMMUNITY): Payer: Self-pay | Admitting: Ophthalmology

## 2022-10-04 NOTE — H&P (Signed)
Surgical History & Physical  Patient Name: Jim Ramirez DOB: 1951-02-04  Surgery: Cataract extraction with intraocular lens implant phacoemulsification; Right Eye  Surgeon: Baruch Goldmann MD Surgery Date:  10-07-22 Pre-Op Date:  09-30-22  HPI: A 17 Yr. old male patient present for 1 week post op OS. Patient is doing well without any complaints OS. Using Combo drop BID OS. Difficulties driving on bright sunny days or at night due to glare for headlights. Blurred vision OD. This is negatively affecting the patient's quality of life and the patient is unable to function adequately in life with the current level of vision. Patient would like to proceed with cataract sx OD. HPI Completed by Dr. Baruch Goldmann  Medical History: Cataracts Arthritis Diabetes High Blood Pressure LDL anxiety, acid reflux  Review of Systems Endocrine DM All recorded systems are negative except as noted above.  Social   Never smoked   Medication Prednisolone-Moxifloxacin-Bromfenac,  Momestasone, Azelastine, Escitalopram, Hydrochlorothiazide, Hydrocodone, Jardiance, Levocetirizine, Meloxicam, Metformin, Metoprolol, Nexium, Simvastatin, Tamsulosin, Trandolapril, Zolpidem, Novolin 70/30,   Sx/Procedures Phaco c IOL OS,    Drug Allergies   NKDA  History & Physical: Heent: cataract, right eye NECK: supple without bruits LUNGS: lungs clear to auscultation CV: regular rate and rhythm Abdomen: soft and non-tender Impression & Plan: Assessment: 1.  CATARACT EXTRACTION STATUS; Left Eye (Z98.42) 2.  COMBINED FORMS AGE RELATED CATARACT; Right Eye (H25.811)  Plan: 1.  1 week after cataract surgery. Doing well with improved vision and normal eye pressure. Call with any problems or concerns. Continue Pred-Moxi-Brom 2x/day for 3 more weeks.  2.  Cataract accounts for the patient's decreased vision. This visual impairment is not correctable with a tolerable change in glasses or contact lenses. Cataract surgery  with an implantation of a new lens should significantly improve the visual and functional status of the patient. Discussed all risks, benefits, alternatives, and potential complications. Discussed the procedures and recovery. Patient desires to have surgery. A-scan ordered and performed today for intra-ocular lens calculations. The surgery will be performed in order to improve vision for driving, reading, and for eye examinations. Recommend phacoemulsification with intra-ocular lens. Recommend Dextenza for post-operative pain and inflammation. Right Eye. Surgery required to correct imbalance of vision. Dilates poorly - shugarcaine by protocol. Malyugin Ring. Omidira.

## 2022-10-07 ENCOUNTER — Ambulatory Visit (HOSPITAL_COMMUNITY)
Admission: RE | Admit: 2022-10-07 | Discharge: 2022-10-07 | Disposition: A | Discharge status: Home / Self Care | Payer: Medicare HMO | Attending: Ophthalmology | Admitting: Ophthalmology

## 2022-10-07 ENCOUNTER — Ambulatory Visit (HOSPITAL_BASED_OUTPATIENT_CLINIC_OR_DEPARTMENT_OTHER): Payer: Medicare HMO | Admitting: Anesthesiology

## 2022-10-07 ENCOUNTER — Encounter (HOSPITAL_COMMUNITY)
Admission: RE | Disposition: A | Discharge status: Home / Self Care | Payer: Self-pay | Source: Home / Self Care | Attending: Ophthalmology

## 2022-10-07 ENCOUNTER — Encounter (HOSPITAL_COMMUNITY): Payer: Self-pay | Admitting: Ophthalmology

## 2022-10-07 ENCOUNTER — Ambulatory Visit (HOSPITAL_COMMUNITY): Payer: Medicare HMO | Admitting: Anesthesiology

## 2022-10-07 DIAGNOSIS — G4730 Sleep apnea, unspecified: Secondary | ICD-10-CM | POA: Diagnosis not present

## 2022-10-07 DIAGNOSIS — M4712 Other spondylosis with myelopathy, cervical region: Secondary | ICD-10-CM | POA: Insufficient documentation

## 2022-10-07 DIAGNOSIS — M1990 Unspecified osteoarthritis, unspecified site: Secondary | ICD-10-CM | POA: Diagnosis not present

## 2022-10-07 DIAGNOSIS — Z86718 Personal history of other venous thrombosis and embolism: Secondary | ICD-10-CM | POA: Diagnosis not present

## 2022-10-07 DIAGNOSIS — H2181 Floppy iris syndrome: Secondary | ICD-10-CM | POA: Diagnosis not present

## 2022-10-07 DIAGNOSIS — K279 Peptic ulcer, site unspecified, unspecified as acute or chronic, without hemorrhage or perforation: Secondary | ICD-10-CM | POA: Diagnosis not present

## 2022-10-07 DIAGNOSIS — E119 Type 2 diabetes mellitus without complications: Secondary | ICD-10-CM | POA: Diagnosis not present

## 2022-10-07 DIAGNOSIS — Z7984 Long term (current) use of oral hypoglycemic drugs: Secondary | ICD-10-CM

## 2022-10-07 DIAGNOSIS — I10 Essential (primary) hypertension: Secondary | ICD-10-CM

## 2022-10-07 DIAGNOSIS — E1136 Type 2 diabetes mellitus with diabetic cataract: Secondary | ICD-10-CM | POA: Insufficient documentation

## 2022-10-07 DIAGNOSIS — F32A Depression, unspecified: Secondary | ICD-10-CM | POA: Diagnosis not present

## 2022-10-07 DIAGNOSIS — H25811 Combined forms of age-related cataract, right eye: Secondary | ICD-10-CM | POA: Insufficient documentation

## 2022-10-07 DIAGNOSIS — R69 Illness, unspecified: Secondary | ICD-10-CM | POA: Diagnosis not present

## 2022-10-07 DIAGNOSIS — Z9842 Cataract extraction status, left eye: Secondary | ICD-10-CM | POA: Insufficient documentation

## 2022-10-07 DIAGNOSIS — Z87891 Personal history of nicotine dependence: Secondary | ICD-10-CM | POA: Diagnosis not present

## 2022-10-07 DIAGNOSIS — K219 Gastro-esophageal reflux disease without esophagitis: Secondary | ICD-10-CM | POA: Diagnosis not present

## 2022-10-07 HISTORY — PX: CATARACT EXTRACTION W/PHACO: SHX586

## 2022-10-07 LAB — GLUCOSE, CAPILLARY: Glucose-Capillary: 125 mg/dL — ABNORMAL HIGH (ref 70–99)

## 2022-10-07 SURGERY — PHACOEMULSIFICATION, CATARACT, WITH IOL INSERTION
Anesthesia: Monitor Anesthesia Care | Site: Eye | Laterality: Right

## 2022-10-07 MED ORDER — EPINEPHRINE PF 1 MG/ML IJ SOLN
INTRAMUSCULAR | Status: AC
  Filled 2022-10-07: qty 1

## 2022-10-07 MED ORDER — TETRACAINE HCL 0.5 % OP SOLN
1.0000 [drp] | OPHTHALMIC | Status: AC | PRN
  Administered 2022-10-07 (×3): 1 [drp] via OPHTHALMIC

## 2022-10-07 MED ORDER — NEOMYCIN-POLYMYXIN-DEXAMETH 3.5-10000-0.1 OP SUSP
OPHTHALMIC | Status: DC | PRN
  Administered 2022-10-07: 2 [drp] via OPHTHALMIC

## 2022-10-07 MED ORDER — TROPICAMIDE 1 % OP SOLN
1.0000 [drp] | OPHTHALMIC | Status: AC | PRN
  Administered 2022-10-07 (×3): 1 [drp] via OPHTHALMIC

## 2022-10-07 MED ORDER — PHENYLEPHRINE-KETOROLAC 1-0.3 % IO SOLN
INTRAOCULAR | Status: DC | PRN
  Administered 2022-10-07: 500 mL via OPHTHALMIC

## 2022-10-07 MED ORDER — BSS IO SOLN
INTRAOCULAR | Status: DC | PRN
  Administered 2022-10-07: 15 mL via INTRAOCULAR

## 2022-10-07 MED ORDER — SODIUM HYALURONATE 10 MG/ML IO SOLUTION
PREFILLED_SYRINGE | INTRAOCULAR | Status: DC | PRN
  Administered 2022-10-07: .85 mL via INTRAOCULAR

## 2022-10-07 MED ORDER — SODIUM HYALURONATE 23MG/ML IO SOSY
PREFILLED_SYRINGE | INTRAOCULAR | Status: DC | PRN
  Administered 2022-10-07: .6 mL via INTRAOCULAR

## 2022-10-07 MED ORDER — MIDAZOLAM HCL 5 MG/5ML IJ SOLN
INTRAMUSCULAR | Status: DC | PRN
  Administered 2022-10-07: 2 mg via INTRAVENOUS

## 2022-10-07 MED ORDER — PHENYLEPHRINE-KETOROLAC 1-0.3 % IO SOLN
INTRAOCULAR | Status: AC
  Filled 2022-10-07: qty 4

## 2022-10-07 MED ORDER — MIDAZOLAM HCL 2 MG/2ML IJ SOLN
INTRAMUSCULAR | Status: AC
  Filled 2022-10-07: qty 2

## 2022-10-07 MED ORDER — POVIDONE-IODINE 5 % OP SOLN
OPHTHALMIC | Status: DC | PRN
  Administered 2022-10-07: 1 via OPHTHALMIC

## 2022-10-07 MED ORDER — STERILE WATER FOR IRRIGATION IR SOLN
Status: DC | PRN
  Administered 2022-10-07: 250 mL

## 2022-10-07 MED ORDER — LIDOCAINE HCL 3.5 % OP GEL
1.0000 | Freq: Once | OPHTHALMIC | Status: AC
  Administered 2022-10-07: 1 via OPHTHALMIC

## 2022-10-07 MED ORDER — LIDOCAINE HCL (PF) 1 % IJ SOLN
INTRAOCULAR | Status: DC | PRN
  Administered 2022-10-07: 1 mL via OPHTHALMIC

## 2022-10-07 MED ORDER — SODIUM CHLORIDE 0.9% FLUSH
INTRAVENOUS | Status: DC | PRN
  Administered 2022-10-07: 5 mL via INTRAVENOUS

## 2022-10-07 MED ORDER — PHENYLEPHRINE HCL 2.5 % OP SOLN
1.0000 [drp] | OPHTHALMIC | Status: AC | PRN
  Administered 2022-10-07 (×3): 1 [drp] via OPHTHALMIC

## 2022-10-07 SURGICAL SUPPLY — 15 items
CATARACT SUITE SIGHTPATH (MISCELLANEOUS) ×1 IMPLANT
CLOTH BEACON ORANGE TIMEOUT ST (SAFETY) ×1 IMPLANT
EYE SHIELD UNIVERSAL CLEAR (GAUZE/BANDAGES/DRESSINGS) IMPLANT
FEE CATARACT SUITE SIGHTPATH (MISCELLANEOUS) ×1 IMPLANT
GLOVE BIOGEL PI IND STRL 7.0 (GLOVE) ×2 IMPLANT
LENS IOL RAYNER 21.5 (Intraocular Lens) ×1 IMPLANT
LENS IOL RAYONE EMV 21.5 (Intraocular Lens) IMPLANT
NDL HYPO 18GX1.5 BLUNT FILL (NEEDLE) ×1 IMPLANT
NEEDLE HYPO 18GX1.5 BLUNT FILL (NEEDLE) ×1 IMPLANT
PAD ARMBOARD 7.5X6 YLW CONV (MISCELLANEOUS) ×1 IMPLANT
RING MALYGIN 7.0 (MISCELLANEOUS) IMPLANT
SYR TB 1ML LL NO SAFETY (SYRINGE) ×1 IMPLANT
TAPE SURG TRANSPORE 1 IN (GAUZE/BANDAGES/DRESSINGS) IMPLANT
TAPE SURGICAL TRANSPORE 1 IN (GAUZE/BANDAGES/DRESSINGS) ×1
WATER STERILE IRR 250ML POUR (IV SOLUTION) ×1 IMPLANT

## 2022-10-07 NOTE — Op Note (Signed)
Date of procedure: 10/07/22  Pre-operative diagnosis: Visually significant age-related combined cataract, Right Eye; Poor Dilation, Right Eye (H25.811)  Post-operative diagnosis: Visually significant age-related cataract, Right Eye; Intra-operative Floppy Iris Syndrome, Right Eye (H21.81)  Procedure: Removal of cataract via phacoemulsification and insertion of intra-ocular lens Rayner [id number] +21.5D into the capsular bag of the Right Eye (CPT [phone])  Attending surgeon: Gerda Diss. Kodey Xue, MD, MA  Anesthesia: MAC, Topical Akten  Complications: None  Estimated Blood Loss: <75m (minimal)  Specimens: None  Implants: As above  Indications:  Visually significant cataract, Right Eye  Procedure:  The patient was seen and identified in the pre-operative area. The operative eye was identified and dilated.  The operative eye was marked.  Topical anesthesia was administered to the operative eye.     The patient was then to the operative suite and placed in the supine position.  A timeout was performed confirming the patient, procedure to be performed, and all other relevant information.   The patient's face was prepped and draped in the usual fashion for intra-ocular surgery.  A lid speculum was placed into the operative eye and the surgical microscope moved into place and focused.  Poor dilation of the iris was confirmed.  A superotemporal paracentesis was created using a 20 gauge paracentesis blade.  Shugarcaine was injected into the anterior chamber.  Viscoelastic was injected into the anterior chamber.  A temporal clear-corneal main wound incision was created using a 2.456mmicrokeratome.  A Malyugin ring was placed.  A continuous curvilinear capsulorrhexis was initiated using an irrigating cystitome and completed using capsulorrhexis forceps.  Hydrodissection and hydrodeliniation were performed.  Viscoelastic was injected into the anterior chamber.  A phacoemulsification handpiece and a chopper as a  second instrument were used to remove the nucleus and epinucleus. The irrigation/aspiration handpiece was used to remove any remaining cortical material.   The capsular bag was reinflated with viscoelastic, checked, and found to be intact.  The intraocular lens was inserted into the capsular bag and dialed into place using a MaSurveyor, minerals The Malyugin ring was removed.  The irrigation/aspiration handpiece was used to remove any remaining viscoelastic.  The clear corneal wound and paracentesis wounds were then hydrated and checked with Weck-Cels to be watertight. Maxitrol drops were instilled into the operative eye.  The lid-speculum and drape was removed, and the patient's face was cleaned with a wet and dry 4x4. A clear shield was taped over the eye. The patient was taken to the post-operative care unit in good condition, having tolerated the procedure well.  Post-Op Instructions: [facility] and will receive all other orders and instructions.

## 2022-10-07 NOTE — Discharge Instructions (Addendum)
Please discharge patient when stable, will follow up today with Dr. Wrzosek at the [facility] office immediately following discharge.  Leave shield in place until visit.  All paperwork with discharge instructions will be given at the office.   [facility] Address:  [address]  [place],  [address]  

## 2022-10-07 NOTE — Anesthesia Postprocedure Evaluation (Signed)
Anesthesia Post Note  Patient: Jim Ramirez  Procedure(s) Performed: CATARACT EXTRACTION PHACO AND INTRAOCULAR LENS PLACEMENT (IOC) (Right: Eye)  Patient location during evaluation: Short Stay Anesthesia Type: MAC Level of consciousness: awake and alert Pain management: pain level controlled Vital Signs Assessment: post-procedure vital signs reviewed and stable Respiratory status: spontaneous breathing Cardiovascular status: stable and blood pressure returned to baseline Postop Assessment: no apparent nausea or vomiting Anesthetic complications: no   No notable events documented.   Last Vitals:  Vitals:   10/07/22 1036 10/07/22 1143  BP: 139/70 117/63  Pulse: 69 67  Resp: 17   Temp: 36.7 C 36.9 C  SpO2: 98% 96%    Last Pain:  Vitals:   10/07/22 1143  TempSrc: Oral  PainSc: 0-No pain                 Leiani Enright

## 2022-10-07 NOTE — Interval H&P Note (Signed)
History and Physical Interval Note:  10/07/2022 11:11 AM  Jim Ramirez  has presented today for surgery, with the diagnosis of combined forms age related cataract;right.  The various methods of treatment have been discussed with the patient and family. After consideration of risks, benefits and other options for treatment, the patient has consented to  Procedure(s) with comments: CATARACT EXTRACTION PHACO AND INTRAOCULAR LENS PLACEMENT ([place]) (Right) - CDE as a surgical intervention.  The patient's history has been reviewed, patient examined, no change in status, stable for surgery.  I have reviewed the patient's chart and labs.  Questions were answered to the patient's satisfaction.     Baruch Goldmann

## 2022-10-07 NOTE — Transfer of Care (Signed)
Immediate Anesthesia Transfer of Care Note  Patient: Jim Ramirez  Procedure(s) Performed: CATARACT EXTRACTION PHACO AND INTRAOCULAR LENS PLACEMENT (IOC) (Right: Eye)  Patient Location: Short Stay  Anesthesia Type:MAC  Level of Consciousness: awake  Airway & Oxygen Therapy: Patient Spontanous Breathing  Post-op Assessment: Report given to RN  Post vital signs: Reviewed and stable  Last Vitals:  Vitals Value Taken Time  BP    Temp    Pulse    Resp    SpO2      Last Pain:  Vitals:   10/07/22 1036  TempSrc: Oral  PainSc: 0-No pain         Complications: No notable events documented.

## 2022-10-07 NOTE — Anesthesia Preprocedure Evaluation (Signed)
Anesthesia Evaluation  Patient identified by MRN, date of birth, ID band Patient awake    Reviewed: Allergy & Precautions, H&P , NPO status , Patient's Chart, lab work & pertinent test results  Airway Mallampati: II  TM Distance: >3 FB Neck ROM: Full   Comment: ACDF Dental  (+) Dental Advisory Given, Upper Dentures, Lower Dentures   Pulmonary sleep apnea , former smoker   Pulmonary exam normal breath sounds clear to auscultation       Cardiovascular Exercise Tolerance: Good hypertension, Pt. on medications + DVT  Normal cardiovascular exam Rhythm:Regular Rate:Normal     Neuro/Psych  PSYCHIATRIC DISORDERS  Depression     Neuromuscular disease (Cervical spondylosis with myelopathy and radiculopathy) CVA (Acute spontaneous subarachnoid intracranial hemorrhage (HCC))    GI/Hepatic Neg liver ROS, PUD,,,  Endo/Other  diabetes, Well Controlled, Type 2, Oral Hypoglycemic Agents    Renal/GU Renal disease  negative genitourinary   Musculoskeletal  (+) Arthritis , Osteoarthritis,    Abdominal   Peds negative pediatric ROS (+)  Hematology negative hematology ROS (+)   Anesthesia Other Findings   Reproductive/Obstetrics negative OB ROS                             Anesthesia Physical Anesthesia Plan  ASA: 3  Anesthesia Plan: MAC   Post-op Pain Management: Minimal or no pain anticipated   Induction:   PONV Risk Score and Plan: Treatment may vary due to age or medical condition  Airway Management Planned: Nasal Cannula and Natural Airway  Additional Equipment:   Intra-op Plan:   Post-operative Plan:   Informed Consent: I have reviewed the patients History and Physical, chart, labs and discussed the procedure including the risks, benefits and alternatives for the proposed anesthesia with the patient or authorized representative who has indicated his/her understanding and acceptance.      Dental advisory given  Plan Discussed with: CRNA and Surgeon  Anesthesia Plan Comments:         Anesthesia Quick Evaluation  

## 2022-10-17 ENCOUNTER — Encounter (HOSPITAL_COMMUNITY): Payer: Self-pay | Admitting: Ophthalmology

## 2022-10-17 DIAGNOSIS — M542 Cervicalgia: Secondary | ICD-10-CM | POA: Diagnosis not present

## 2022-10-17 DIAGNOSIS — I10 Essential (primary) hypertension: Secondary | ICD-10-CM | POA: Diagnosis not present

## 2022-10-17 DIAGNOSIS — E538 Deficiency of other specified B group vitamins: Secondary | ICD-10-CM | POA: Diagnosis not present

## 2022-10-17 DIAGNOSIS — E1165 Type 2 diabetes mellitus with hyperglycemia: Secondary | ICD-10-CM | POA: Diagnosis not present

## 2022-10-17 DIAGNOSIS — Z299 Encounter for prophylactic measures, unspecified: Secondary | ICD-10-CM | POA: Diagnosis not present

## 2022-11-14 DIAGNOSIS — H524 Presbyopia: Secondary | ICD-10-CM | POA: Diagnosis not present

## 2022-11-18 DIAGNOSIS — I10 Essential (primary) hypertension: Secondary | ICD-10-CM | POA: Diagnosis not present

## 2022-11-18 DIAGNOSIS — E538 Deficiency of other specified B group vitamins: Secondary | ICD-10-CM | POA: Diagnosis not present

## 2022-11-18 DIAGNOSIS — M1710 Unilateral primary osteoarthritis, unspecified knee: Secondary | ICD-10-CM | POA: Diagnosis not present

## 2022-11-18 DIAGNOSIS — Z299 Encounter for prophylactic measures, unspecified: Secondary | ICD-10-CM | POA: Diagnosis not present

## 2022-11-18 DIAGNOSIS — R69 Illness, unspecified: Secondary | ICD-10-CM | POA: Diagnosis not present

## 2022-11-19 ENCOUNTER — Ambulatory Visit (HOSPITAL_COMMUNITY)
Admission: RE | Admit: 2022-11-19 | Discharge: 2022-11-19 | Disposition: A | Discharge status: Home / Self Care | Payer: Medicare HMO | Source: Ambulatory Visit | Attending: Cardiology | Admitting: Cardiology

## 2022-11-19 DIAGNOSIS — R0609 Other forms of dyspnea: Secondary | ICD-10-CM | POA: Diagnosis present

## 2022-11-19 LAB — PULMONARY FUNCTION TEST
DL/VA % pred: 139 %
DL/VA: 5.56 ml/min/mmHg/L
DLCO unc % pred: 76 %
DLCO unc: 21.07 ml/min/mmHg
FEF 25-75 Post: 2.29 L/sec
FEF 25-75 Pre: 1.37 L/sec
FEF2575-%Change-Post: 67 %
FEF2575-%Pred-Post: 84 %
FEF2575-%Pred-Pre: 50 %
FEV1-%Change-Post: 17 %
FEV1-%Pred-Post: 52 %
FEV1-%Pred-Pre: 44 %
FEV1-Post: 1.86 L
FEV1-Pre: 1.58 L
FEV1FVC-%Change-Post: -1 %
FEV1FVC-%Pred-Pre: 104 %
FEV6-%Change-Post: 18 %
FEV6-%Pred-Post: 53 %
FEV6-%Pred-Pre: 45 %
FEV6-Post: 2.43 L
FEV6-Pre: 2.05 L
FEV6FVC-%Change-Post: 0 %
FEV6FVC-%Pred-Post: 104 %
FEV6FVC-%Pred-Pre: 105 %
FVC-%Change-Post: 19 %
FVC-%Pred-Post: 51 %
FVC-%Pred-Pre: 42 %
FVC-Post: 2.45 L
FVC-Pre: 2.05 L
Post FEV1/FVC ratio: 76 %
Post FEV6/FVC ratio: 99 %
Pre FEV1/FVC ratio: 77 %
Pre FEV6/FVC Ratio: 100 %
RV % pred: 99 %
RV: 2.53 L
TLC % pred: 64 %
TLC: 4.8 L

## 2022-11-19 MED ORDER — ALBUTEROL SULFATE (2.5 MG/3ML) 0.083% IN NEBU
2.5000 mg | INHALATION_SOLUTION | Freq: Once | RESPIRATORY_TRACT | Status: AC
  Administered 2022-11-19: 2.5 mg via RESPIRATORY_TRACT

## 2022-12-05 ENCOUNTER — Telehealth: Payer: Self-pay | Admitting: Cardiology

## 2022-12-05 DIAGNOSIS — R942 Abnormal results of pulmonary function studies: Secondary | ICD-10-CM

## 2022-12-05 NOTE — Telephone Encounter (Signed)
Pt came into the office wanting results from his PFT

## 2022-12-06 NOTE — Telephone Encounter (Signed)
-----   Message from Antoine Poche, MD sent at 12/06/2022 10:18 AM EDT ----- PFTs with some abnormalities, please refer to pulmonary for evaluation  Dominga Ferry MD

## 2022-12-06 NOTE — Telephone Encounter (Signed)
Results discussed with patient. Referral placed to pulmonary, patient has their number. PCP copied.

## 2022-12-18 DIAGNOSIS — E7800 Pure hypercholesterolemia, unspecified: Secondary | ICD-10-CM | POA: Diagnosis not present

## 2022-12-18 DIAGNOSIS — M1710 Unilateral primary osteoarthritis, unspecified knee: Secondary | ICD-10-CM | POA: Diagnosis not present

## 2022-12-18 DIAGNOSIS — R0602 Shortness of breath: Secondary | ICD-10-CM | POA: Diagnosis not present

## 2022-12-18 DIAGNOSIS — I10 Essential (primary) hypertension: Secondary | ICD-10-CM | POA: Diagnosis not present

## 2022-12-18 DIAGNOSIS — E538 Deficiency of other specified B group vitamins: Secondary | ICD-10-CM | POA: Diagnosis not present

## 2022-12-18 DIAGNOSIS — Z299 Encounter for prophylactic measures, unspecified: Secondary | ICD-10-CM | POA: Diagnosis not present

## 2022-12-22 DIAGNOSIS — M545 Low back pain, unspecified: Secondary | ICD-10-CM | POA: Diagnosis not present

## 2022-12-22 DIAGNOSIS — G8929 Other chronic pain: Secondary | ICD-10-CM | POA: Diagnosis not present

## 2022-12-22 DIAGNOSIS — Z87891 Personal history of nicotine dependence: Secondary | ICD-10-CM | POA: Diagnosis not present

## 2022-12-22 DIAGNOSIS — K5730 Diverticulosis of large intestine without perforation or abscess without bleeding: Secondary | ICD-10-CM | POA: Diagnosis not present

## 2022-12-22 DIAGNOSIS — I10 Essential (primary) hypertension: Secondary | ICD-10-CM | POA: Diagnosis not present

## 2022-12-22 DIAGNOSIS — E119 Type 2 diabetes mellitus without complications: Secondary | ICD-10-CM | POA: Diagnosis not present

## 2022-12-22 DIAGNOSIS — M5441 Lumbago with sciatica, right side: Secondary | ICD-10-CM | POA: Diagnosis not present

## 2022-12-22 DIAGNOSIS — Z79899 Other long term (current) drug therapy: Secondary | ICD-10-CM | POA: Diagnosis not present

## 2022-12-22 DIAGNOSIS — R918 Other nonspecific abnormal finding of lung field: Secondary | ICD-10-CM | POA: Diagnosis not present

## 2022-12-22 DIAGNOSIS — M5442 Lumbago with sciatica, left side: Secondary | ICD-10-CM | POA: Diagnosis not present

## 2022-12-22 DIAGNOSIS — I4510 Unspecified right bundle-branch block: Secondary | ICD-10-CM | POA: Diagnosis not present

## 2022-12-22 DIAGNOSIS — R0602 Shortness of breath: Secondary | ICD-10-CM | POA: Diagnosis not present

## 2022-12-22 DIAGNOSIS — K219 Gastro-esophageal reflux disease without esophagitis: Secondary | ICD-10-CM | POA: Diagnosis not present

## 2022-12-22 DIAGNOSIS — Z7902 Long term (current) use of antithrombotics/antiplatelets: Secondary | ICD-10-CM | POA: Diagnosis not present

## 2022-12-22 DIAGNOSIS — I700 Atherosclerosis of aorta: Secondary | ICD-10-CM | POA: Diagnosis not present

## 2022-12-22 DIAGNOSIS — N200 Calculus of kidney: Secondary | ICD-10-CM | POA: Diagnosis not present

## 2022-12-22 DIAGNOSIS — Z794 Long term (current) use of insulin: Secondary | ICD-10-CM | POA: Diagnosis not present

## 2022-12-23 ENCOUNTER — Encounter (HOSPITAL_COMMUNITY): Payer: Self-pay

## 2022-12-23 DIAGNOSIS — M549 Dorsalgia, unspecified: Secondary | ICD-10-CM | POA: Diagnosis not present

## 2022-12-23 DIAGNOSIS — Z87891 Personal history of nicotine dependence: Secondary | ICD-10-CM | POA: Diagnosis not present

## 2022-12-23 DIAGNOSIS — I214 Non-ST elevation (NSTEMI) myocardial infarction: Secondary | ICD-10-CM | POA: Diagnosis not present

## 2022-12-23 DIAGNOSIS — R4182 Altered mental status, unspecified: Secondary | ICD-10-CM | POA: Diagnosis not present

## 2022-12-23 DIAGNOSIS — Z8673 Personal history of transient ischemic attack (TIA), and cerebral infarction without residual deficits: Secondary | ICD-10-CM | POA: Diagnosis not present

## 2022-12-23 DIAGNOSIS — R410 Disorientation, unspecified: Secondary | ICD-10-CM | POA: Diagnosis not present

## 2022-12-23 DIAGNOSIS — I493 Ventricular premature depolarization: Secondary | ICD-10-CM | POA: Diagnosis not present

## 2022-12-23 DIAGNOSIS — G8929 Other chronic pain: Secondary | ICD-10-CM | POA: Diagnosis not present

## 2022-12-23 DIAGNOSIS — I10 Essential (primary) hypertension: Secondary | ICD-10-CM | POA: Diagnosis not present

## 2022-12-23 DIAGNOSIS — M4626 Osteomyelitis of vertebra, lumbar region: Secondary | ICD-10-CM | POA: Diagnosis not present

## 2022-12-23 DIAGNOSIS — I4510 Unspecified right bundle-branch block: Secondary | ICD-10-CM | POA: Diagnosis not present

## 2022-12-23 DIAGNOSIS — I480 Paroxysmal atrial fibrillation: Secondary | ICD-10-CM | POA: Diagnosis not present

## 2022-12-23 DIAGNOSIS — R000 Tachycardia, unspecified: Secondary | ICD-10-CM | POA: Diagnosis not present

## 2022-12-23 DIAGNOSIS — R9431 Abnormal electrocardiogram [ECG] [EKG]: Secondary | ICD-10-CM | POA: Diagnosis not present

## 2022-12-23 DIAGNOSIS — R0600 Dyspnea, unspecified: Secondary | ICD-10-CM | POA: Diagnosis not present

## 2022-12-23 DIAGNOSIS — Z794 Long term (current) use of insulin: Secondary | ICD-10-CM | POA: Diagnosis not present

## 2022-12-23 DIAGNOSIS — M545 Low back pain, unspecified: Secondary | ICD-10-CM | POA: Diagnosis not present

## 2022-12-23 DIAGNOSIS — J441 Chronic obstructive pulmonary disease with (acute) exacerbation: Secondary | ICD-10-CM | POA: Diagnosis not present

## 2022-12-23 DIAGNOSIS — N390 Urinary tract infection, site not specified: Secondary | ICD-10-CM | POA: Diagnosis not present

## 2022-12-23 DIAGNOSIS — E1169 Type 2 diabetes mellitus with other specified complication: Secondary | ICD-10-CM | POA: Diagnosis not present

## 2022-12-23 DIAGNOSIS — Z743 Need for continuous supervision: Secondary | ICD-10-CM | POA: Diagnosis not present

## 2022-12-23 DIAGNOSIS — I2510 Atherosclerotic heart disease of native coronary artery without angina pectoris: Secondary | ICD-10-CM | POA: Diagnosis not present

## 2022-12-23 DIAGNOSIS — M2578 Osteophyte, vertebrae: Secondary | ICD-10-CM | POA: Diagnosis not present

## 2022-12-23 DIAGNOSIS — Z1152 Encounter for screening for COVID-19: Secondary | ICD-10-CM | POA: Diagnosis not present

## 2022-12-23 DIAGNOSIS — R0609 Other forms of dyspnea: Secondary | ICD-10-CM | POA: Diagnosis not present

## 2022-12-23 DIAGNOSIS — J9811 Atelectasis: Secondary | ICD-10-CM | POA: Diagnosis not present

## 2022-12-23 DIAGNOSIS — R079 Chest pain, unspecified: Secondary | ICD-10-CM | POA: Diagnosis not present

## 2022-12-23 DIAGNOSIS — B9689 Other specified bacterial agents as the cause of diseases classified elsewhere: Secondary | ICD-10-CM | POA: Diagnosis not present

## 2022-12-23 DIAGNOSIS — N179 Acute kidney failure, unspecified: Secondary | ICD-10-CM | POA: Diagnosis not present

## 2022-12-23 DIAGNOSIS — M869 Osteomyelitis, unspecified: Secondary | ICD-10-CM | POA: Diagnosis not present

## 2022-12-23 DIAGNOSIS — R002 Palpitations: Secondary | ICD-10-CM | POA: Diagnosis not present

## 2022-12-23 DIAGNOSIS — I4891 Unspecified atrial fibrillation: Secondary | ICD-10-CM | POA: Diagnosis not present

## 2022-12-24 ENCOUNTER — Inpatient Hospital Stay (HOSPITAL_COMMUNITY)
Admit: 2022-12-24 | Discharge: 2023-01-28 | DRG: 853 | Disposition: A | Discharge status: Skilled Nursing Facility | Payer: Medicare HMO | Source: Other Acute Inpatient Hospital | Attending: Internal Medicine | Admitting: Internal Medicine

## 2022-12-24 DIAGNOSIS — I2510 Atherosclerotic heart disease of native coronary artery without angina pectoris: Secondary | ICD-10-CM | POA: Diagnosis not present

## 2022-12-24 DIAGNOSIS — Z794 Long term (current) use of insulin: Secondary | ICD-10-CM

## 2022-12-24 DIAGNOSIS — E119 Type 2 diabetes mellitus without complications: Secondary | ICD-10-CM | POA: Diagnosis not present

## 2022-12-24 DIAGNOSIS — E1165 Type 2 diabetes mellitus with hyperglycemia: Secondary | ICD-10-CM | POA: Diagnosis present

## 2022-12-24 DIAGNOSIS — I214 Non-ST elevation (NSTEMI) myocardial infarction: Secondary | ICD-10-CM | POA: Diagnosis not present

## 2022-12-24 DIAGNOSIS — M4640 Discitis, unspecified, site unspecified: Secondary | ICD-10-CM | POA: Diagnosis not present

## 2022-12-24 DIAGNOSIS — M009 Pyogenic arthritis, unspecified: Secondary | ICD-10-CM | POA: Diagnosis present

## 2022-12-24 DIAGNOSIS — A4101 Sepsis due to Methicillin susceptible Staphylococcus aureus: Principal | ICD-10-CM | POA: Diagnosis present

## 2022-12-24 DIAGNOSIS — J441 Chronic obstructive pulmonary disease with (acute) exacerbation: Secondary | ICD-10-CM | POA: Diagnosis not present

## 2022-12-24 DIAGNOSIS — I330 Acute and subacute infective endocarditis: Secondary | ICD-10-CM | POA: Diagnosis not present

## 2022-12-24 DIAGNOSIS — R0600 Dyspnea, unspecified: Secondary | ICD-10-CM | POA: Diagnosis not present

## 2022-12-24 DIAGNOSIS — Z66 Do not resuscitate: Secondary | ICD-10-CM | POA: Diagnosis not present

## 2022-12-24 DIAGNOSIS — I4891 Unspecified atrial fibrillation: Principal | ICD-10-CM | POA: Diagnosis present

## 2022-12-24 DIAGNOSIS — R6520 Severe sepsis without septic shock: Secondary | ICD-10-CM | POA: Diagnosis present

## 2022-12-24 DIAGNOSIS — G0490 Encephalitis and encephalomyelitis, unspecified: Secondary | ICD-10-CM | POA: Diagnosis not present

## 2022-12-24 DIAGNOSIS — Z4789 Encounter for other orthopedic aftercare: Secondary | ICD-10-CM | POA: Diagnosis not present

## 2022-12-24 DIAGNOSIS — N179 Acute kidney failure, unspecified: Secondary | ICD-10-CM | POA: Diagnosis not present

## 2022-12-24 DIAGNOSIS — R509 Fever, unspecified: Secondary | ICD-10-CM | POA: Diagnosis not present

## 2022-12-24 DIAGNOSIS — L89611 Pressure ulcer of right heel, stage 1: Secondary | ICD-10-CM | POA: Diagnosis not present

## 2022-12-24 DIAGNOSIS — R531 Weakness: Secondary | ICD-10-CM | POA: Diagnosis not present

## 2022-12-24 DIAGNOSIS — Z515 Encounter for palliative care: Secondary | ICD-10-CM | POA: Diagnosis not present

## 2022-12-24 DIAGNOSIS — G8929 Other chronic pain: Secondary | ICD-10-CM | POA: Diagnosis not present

## 2022-12-24 DIAGNOSIS — L89322 Pressure ulcer of left buttock, stage 2: Secondary | ICD-10-CM | POA: Diagnosis not present

## 2022-12-24 DIAGNOSIS — B9561 Methicillin susceptible Staphylococcus aureus infection as the cause of diseases classified elsewhere: Secondary | ICD-10-CM

## 2022-12-24 DIAGNOSIS — D509 Iron deficiency anemia, unspecified: Secondary | ICD-10-CM | POA: Diagnosis not present

## 2022-12-24 DIAGNOSIS — E785 Hyperlipidemia, unspecified: Secondary | ICD-10-CM | POA: Diagnosis present

## 2022-12-24 DIAGNOSIS — M86641 Other chronic osteomyelitis, right hand: Secondary | ICD-10-CM | POA: Diagnosis not present

## 2022-12-24 DIAGNOSIS — G9349 Other encephalopathy: Secondary | ICD-10-CM | POA: Diagnosis not present

## 2022-12-24 DIAGNOSIS — A419 Sepsis, unspecified organism: Secondary | ICD-10-CM | POA: Diagnosis not present

## 2022-12-24 DIAGNOSIS — Z7984 Long term (current) use of oral hypoglycemic drugs: Secondary | ICD-10-CM

## 2022-12-24 DIAGNOSIS — M7989 Other specified soft tissue disorders: Secondary | ICD-10-CM | POA: Diagnosis not present

## 2022-12-24 DIAGNOSIS — H02402 Unspecified ptosis of left eyelid: Secondary | ICD-10-CM | POA: Diagnosis present

## 2022-12-24 DIAGNOSIS — G062 Extradural and subdural abscess, unspecified: Secondary | ICD-10-CM | POA: Diagnosis not present

## 2022-12-24 DIAGNOSIS — M4646 Discitis, unspecified, lumbar region: Secondary | ICD-10-CM | POA: Diagnosis not present

## 2022-12-24 DIAGNOSIS — L89312 Pressure ulcer of right buttock, stage 2: Secondary | ICD-10-CM | POA: Diagnosis not present

## 2022-12-24 DIAGNOSIS — Z981 Arthrodesis status: Secondary | ICD-10-CM

## 2022-12-24 DIAGNOSIS — I4510 Unspecified right bundle-branch block: Secondary | ICD-10-CM | POA: Diagnosis not present

## 2022-12-24 DIAGNOSIS — Z79899 Other long term (current) drug therapy: Secondary | ICD-10-CM

## 2022-12-24 DIAGNOSIS — M8618 Other acute osteomyelitis, other site: Secondary | ICD-10-CM | POA: Diagnosis not present

## 2022-12-24 DIAGNOSIS — I10 Essential (primary) hypertension: Secondary | ICD-10-CM | POA: Diagnosis not present

## 2022-12-24 DIAGNOSIS — M879 Osteonecrosis, unspecified: Secondary | ICD-10-CM | POA: Diagnosis present

## 2022-12-24 DIAGNOSIS — M5442 Lumbago with sciatica, left side: Secondary | ICD-10-CM | POA: Diagnosis not present

## 2022-12-24 DIAGNOSIS — Z7189 Other specified counseling: Secondary | ICD-10-CM | POA: Diagnosis not present

## 2022-12-24 DIAGNOSIS — Z1152 Encounter for screening for COVID-19: Secondary | ICD-10-CM | POA: Diagnosis not present

## 2022-12-24 DIAGNOSIS — N390 Urinary tract infection, site not specified: Secondary | ICD-10-CM | POA: Diagnosis not present

## 2022-12-24 DIAGNOSIS — R002 Palpitations: Secondary | ICD-10-CM | POA: Diagnosis not present

## 2022-12-24 DIAGNOSIS — M5116 Intervertebral disc disorders with radiculopathy, lumbar region: Secondary | ICD-10-CM | POA: Diagnosis not present

## 2022-12-24 DIAGNOSIS — G061 Intraspinal abscess and granuloma: Secondary | ICD-10-CM | POA: Diagnosis not present

## 2022-12-24 DIAGNOSIS — Z87891 Personal history of nicotine dependence: Secondary | ICD-10-CM

## 2022-12-24 DIAGNOSIS — Z8249 Family history of ischemic heart disease and other diseases of the circulatory system: Secondary | ICD-10-CM

## 2022-12-24 DIAGNOSIS — M2578 Osteophyte, vertebrae: Secondary | ICD-10-CM | POA: Diagnosis not present

## 2022-12-24 DIAGNOSIS — I493 Ventricular premature depolarization: Secondary | ICD-10-CM | POA: Diagnosis not present

## 2022-12-24 DIAGNOSIS — R410 Disorientation, unspecified: Secondary | ICD-10-CM | POA: Diagnosis not present

## 2022-12-24 DIAGNOSIS — L89621 Pressure ulcer of left heel, stage 1: Secondary | ICD-10-CM | POA: Diagnosis not present

## 2022-12-24 DIAGNOSIS — R7989 Other specified abnormal findings of blood chemistry: Secondary | ICD-10-CM | POA: Diagnosis not present

## 2022-12-24 DIAGNOSIS — I2489 Other forms of acute ischemic heart disease: Secondary | ICD-10-CM | POA: Diagnosis not present

## 2022-12-24 DIAGNOSIS — F39 Unspecified mood [affective] disorder: Secondary | ICD-10-CM | POA: Diagnosis present

## 2022-12-24 DIAGNOSIS — D6959 Other secondary thrombocytopenia: Secondary | ICD-10-CM | POA: Diagnosis not present

## 2022-12-24 DIAGNOSIS — I82409 Acute embolism and thrombosis of unspecified deep veins of unspecified lower extremity: Secondary | ICD-10-CM | POA: Diagnosis not present

## 2022-12-24 DIAGNOSIS — G4733 Obstructive sleep apnea (adult) (pediatric): Secondary | ICD-10-CM | POA: Diagnosis present

## 2022-12-24 DIAGNOSIS — R7881 Bacteremia: Secondary | ICD-10-CM | POA: Diagnosis not present

## 2022-12-24 DIAGNOSIS — L02413 Cutaneous abscess of right upper limb: Secondary | ICD-10-CM | POA: Diagnosis not present

## 2022-12-24 DIAGNOSIS — M869 Osteomyelitis, unspecified: Secondary | ICD-10-CM | POA: Diagnosis not present

## 2022-12-24 DIAGNOSIS — M545 Low back pain, unspecified: Secondary | ICD-10-CM | POA: Diagnosis not present

## 2022-12-24 DIAGNOSIS — R9082 White matter disease, unspecified: Secondary | ICD-10-CM | POA: Diagnosis not present

## 2022-12-24 DIAGNOSIS — B9689 Other specified bacterial agents as the cause of diseases classified elsewhere: Secondary | ICD-10-CM | POA: Diagnosis not present

## 2022-12-24 DIAGNOSIS — R000 Tachycardia, unspecified: Secondary | ICD-10-CM | POA: Diagnosis not present

## 2022-12-24 DIAGNOSIS — E860 Dehydration: Secondary | ICD-10-CM | POA: Diagnosis present

## 2022-12-24 DIAGNOSIS — T402X5A Adverse effect of other opioids, initial encounter: Secondary | ICD-10-CM | POA: Diagnosis not present

## 2022-12-24 DIAGNOSIS — L03011 Cellulitis of right finger: Secondary | ICD-10-CM | POA: Diagnosis not present

## 2022-12-24 DIAGNOSIS — N400 Enlarged prostate without lower urinary tract symptoms: Secondary | ICD-10-CM | POA: Diagnosis present

## 2022-12-24 DIAGNOSIS — I2699 Other pulmonary embolism without acute cor pulmonale: Secondary | ICD-10-CM | POA: Diagnosis not present

## 2022-12-24 DIAGNOSIS — J9811 Atelectasis: Secondary | ICD-10-CM | POA: Diagnosis not present

## 2022-12-24 DIAGNOSIS — E1169 Type 2 diabetes mellitus with other specified complication: Secondary | ICD-10-CM | POA: Diagnosis present

## 2022-12-24 DIAGNOSIS — M86141 Other acute osteomyelitis, right hand: Secondary | ICD-10-CM | POA: Diagnosis not present

## 2022-12-24 DIAGNOSIS — H25812 Combined forms of age-related cataract, left eye: Secondary | ICD-10-CM

## 2022-12-24 DIAGNOSIS — M5126 Other intervertebral disc displacement, lumbar region: Secondary | ICD-10-CM | POA: Diagnosis not present

## 2022-12-24 DIAGNOSIS — Z8673 Personal history of transient ischemic attack (TIA), and cerebral infarction without residual deficits: Secondary | ICD-10-CM | POA: Diagnosis not present

## 2022-12-24 DIAGNOSIS — R600 Localized edema: Secondary | ICD-10-CM | POA: Diagnosis not present

## 2022-12-24 DIAGNOSIS — M4626 Osteomyelitis of vertebra, lumbar region: Secondary | ICD-10-CM | POA: Diagnosis not present

## 2022-12-24 DIAGNOSIS — E440 Moderate protein-calorie malnutrition: Secondary | ICD-10-CM | POA: Diagnosis not present

## 2022-12-24 DIAGNOSIS — L02511 Cutaneous abscess of right hand: Secondary | ICD-10-CM | POA: Diagnosis not present

## 2022-12-24 DIAGNOSIS — R609 Edema, unspecified: Secondary | ICD-10-CM | POA: Diagnosis not present

## 2022-12-24 DIAGNOSIS — I82442 Acute embolism and thrombosis of left tibial vein: Secondary | ICD-10-CM | POA: Diagnosis not present

## 2022-12-24 DIAGNOSIS — M609 Myositis, unspecified: Secondary | ICD-10-CM | POA: Diagnosis not present

## 2022-12-24 DIAGNOSIS — Z833 Family history of diabetes mellitus: Secondary | ICD-10-CM

## 2022-12-24 DIAGNOSIS — E876 Hypokalemia: Secondary | ICD-10-CM | POA: Diagnosis present

## 2022-12-24 DIAGNOSIS — Z6825 Body mass index (BMI) 25.0-25.9, adult: Secondary | ICD-10-CM

## 2022-12-24 DIAGNOSIS — I480 Paroxysmal atrial fibrillation: Secondary | ICD-10-CM | POA: Diagnosis not present

## 2022-12-24 DIAGNOSIS — M48061 Spinal stenosis, lumbar region without neurogenic claudication: Secondary | ICD-10-CM | POA: Diagnosis not present

## 2022-12-24 DIAGNOSIS — I82462 Acute embolism and thrombosis of left calf muscular vein: Secondary | ICD-10-CM | POA: Diagnosis not present

## 2022-12-24 DIAGNOSIS — R5381 Other malaise: Secondary | ICD-10-CM | POA: Diagnosis not present

## 2022-12-24 DIAGNOSIS — R0609 Other forms of dyspnea: Secondary | ICD-10-CM | POA: Diagnosis not present

## 2022-12-24 DIAGNOSIS — R0603 Acute respiratory distress: Secondary | ICD-10-CM | POA: Diagnosis present

## 2022-12-24 DIAGNOSIS — M5127 Other intervertebral disc displacement, lumbosacral region: Secondary | ICD-10-CM | POA: Diagnosis not present

## 2022-12-24 DIAGNOSIS — M47816 Spondylosis without myelopathy or radiculopathy, lumbar region: Secondary | ICD-10-CM | POA: Diagnosis not present

## 2022-12-24 DIAGNOSIS — R9431 Abnormal electrocardiogram [ECG] [EKG]: Secondary | ICD-10-CM | POA: Diagnosis not present

## 2022-12-24 DIAGNOSIS — R0602 Shortness of breath: Secondary | ICD-10-CM | POA: Diagnosis not present

## 2022-12-24 DIAGNOSIS — Z7901 Long term (current) use of anticoagulants: Secondary | ICD-10-CM

## 2022-12-24 DIAGNOSIS — E1140 Type 2 diabetes mellitus with diabetic neuropathy, unspecified: Secondary | ICD-10-CM | POA: Diagnosis not present

## 2022-12-24 DIAGNOSIS — K5903 Drug induced constipation: Secondary | ICD-10-CM | POA: Diagnosis not present

## 2022-12-24 LAB — CBC WITH DIFFERENTIAL/PLATELET
Abs Immature Granulocytes: 0.03 10*3/uL (ref 0.00–0.07)
Basophils Absolute: 0 10*3/uL (ref 0.0–0.1)
Basophils Relative: 0 %
Eosinophils Absolute: 0 10*3/uL (ref 0.0–0.5)
Eosinophils Relative: 0 %
HCT: 39.4 % (ref 39.0–52.0)
Hemoglobin: 12.6 g/dL — ABNORMAL LOW (ref 13.0–17.0)
Immature Granulocytes: 0 %
Lymphocytes Relative: 6 %
Lymphs Abs: 0.7 10*3/uL (ref 0.7–4.0)
MCH: 25 pg — ABNORMAL LOW (ref 26.0–34.0)
MCHC: 32 g/dL (ref 30.0–36.0)
MCV: 78.3 fL — ABNORMAL LOW (ref 80.0–100.0)
Monocytes Absolute: 0.9 10*3/uL (ref 0.1–1.0)
Monocytes Relative: 8 %
Neutro Abs: 10.5 10*3/uL — ABNORMAL HIGH (ref 1.7–7.7)
Neutrophils Relative %: 86 %
Platelets: UNDETERMINED 10*3/uL (ref 150–400)
RBC: 5.03 MIL/uL (ref 4.22–5.81)
RDW: 15.4 % (ref 11.5–15.5)
WBC: 12.1 10*3/uL — ABNORMAL HIGH (ref 4.0–10.5)
nRBC: 0 % (ref 0.0–0.2)

## 2022-12-24 LAB — TSH: TSH: 0.457 u[IU]/mL (ref 0.350–4.500)

## 2022-12-24 LAB — SEDIMENTATION RATE: Sed Rate: 43 mm/hr — ABNORMAL HIGH (ref 0–16)

## 2022-12-24 LAB — MRSA NEXT GEN BY PCR, NASAL: MRSA by PCR Next Gen: NOT DETECTED

## 2022-12-24 LAB — COMPREHENSIVE METABOLIC PANEL
ALT: 34 U/L (ref 0–44)
AST: 92 U/L — ABNORMAL HIGH (ref 15–41)
Albumin: 1.9 g/dL — ABNORMAL LOW (ref 3.5–5.0)
Alkaline Phosphatase: 70 U/L (ref 38–126)
Anion gap: 18 — ABNORMAL HIGH (ref 5–15)
BUN: 54 mg/dL — ABNORMAL HIGH (ref 8–23)
CO2: 17 mmol/L — ABNORMAL LOW (ref 22–32)
Calcium: 8.4 mg/dL — ABNORMAL LOW (ref 8.9–10.3)
Chloride: 104 mmol/L (ref 98–111)
Creatinine, Ser: 1.23 mg/dL (ref 0.61–1.24)
GFR, Estimated: >60 mL/min (ref >60)
Glucose, Bld: 218 mg/dL — ABNORMAL HIGH (ref 70–99)
Potassium: 3.8 mmol/L (ref 3.5–5.1)
Sodium: 139 mmol/L (ref 135–145)
Total Bilirubin: 1.9 mg/dL — ABNORMAL HIGH (ref 0.3–1.2)
Total Protein: 5.6 g/dL — ABNORMAL LOW (ref 6.5–8.1)

## 2022-12-24 LAB — GLUCOSE, CAPILLARY
Glucose-Capillary: 211 mg/dL — ABNORMAL HIGH (ref 70–99)
Glucose-Capillary: 237 mg/dL — ABNORMAL HIGH (ref 70–99)

## 2022-12-24 LAB — TROPONIN I (HIGH SENSITIVITY)
Troponin I (High Sensitivity): 227 ng/L (ref <18)
Troponin I (High Sensitivity): 181 ng/L (ref <18)

## 2022-12-24 LAB — HIV ANTIBODY (ROUTINE TESTING W REFLEX): HIV Screen 4th Generation wRfx: NONREACTIVE

## 2022-12-24 LAB — LACTIC ACID, PLASMA
Lactic Acid, Venous: 2.3 mmol/L (ref 0.5–1.9)
Lactic Acid, Venous: 2.5 mmol/L (ref 0.5–1.9)

## 2022-12-24 LAB — C-REACTIVE PROTEIN: CRP: 48.2 mg/dL — ABNORMAL HIGH (ref <1.0)

## 2022-12-24 LAB — PROCALCITONIN: Procalcitonin: 25.67 ng/mL

## 2022-12-24 MED ORDER — VANCOMYCIN HCL 1750 MG/350ML IV SOLN
1750.0000 mg | INTRAVENOUS | Status: DC
  Administered 2022-12-24: 1750 mg via INTRAVENOUS
  Filled 2022-12-24: qty 350

## 2022-12-24 MED ORDER — ESCITALOPRAM OXALATE 10 MG PO TABS
10.0000 mg | ORAL_TABLET | Freq: Every day | ORAL | Status: DC
  Administered 2022-12-25 – 2023-01-28 (×35): 10 mg via ORAL
  Filled 2022-12-24 (×35): qty 1

## 2022-12-24 MED ORDER — LEVOCETIRIZINE DIHYDROCHLORIDE 5 MG PO TABS: 5.0000 mg | ORAL_TABLET | Freq: Every evening | ORAL | Status: DC

## 2022-12-24 MED ORDER — ACETAMINOPHEN 650 MG RE SUPP: 650.0000 mg | Freq: Four times a day (QID) | RECTAL | Status: DC | PRN

## 2022-12-24 MED ORDER — ROSUVASTATIN CALCIUM 20 MG PO TABS
20.0000 mg | ORAL_TABLET | Freq: Every day | ORAL | Status: DC
  Administered 2022-12-25 – 2023-01-28 (×35): 20 mg via ORAL
  Filled 2022-12-24 (×35): qty 1

## 2022-12-24 MED ORDER — OXYCODONE HCL 5 MG PO TABS
5.0000 mg | ORAL_TABLET | ORAL | Status: DC | PRN
  Administered 2022-12-25 – 2023-01-23 (×46): 5 mg via ORAL
  Filled 2022-12-24 (×47): qty 1

## 2022-12-24 MED ORDER — MORPHINE SULFATE (PF) 2 MG/ML IV SOLN
2.0000 mg | INTRAVENOUS | Status: DC | PRN
  Administered 2022-12-29 – 2023-01-22 (×21): 2 mg via INTRAVENOUS
  Filled 2022-12-24 (×21): qty 1

## 2022-12-24 MED ORDER — SODIUM CHLORIDE 0.9 % IV SOLN
2.0000 g | Freq: Three times a day (TID) | INTRAVENOUS | Status: DC
  Administered 2022-12-24 – 2022-12-25 (×2): 2 g via INTRAVENOUS
  Filled 2022-12-24 (×2): qty 12.5

## 2022-12-24 MED ORDER — LORAZEPAM 2 MG/ML IJ SOLN: 1.0000 mg | Freq: Once | INTRAMUSCULAR | Status: DC | PRN

## 2022-12-24 MED ORDER — METHOCARBAMOL 1000 MG/10ML IJ SOLN: 500.0000 mg | Freq: Four times a day (QID) | INTRAVENOUS | Status: DC | PRN

## 2022-12-24 MED ORDER — PANTOPRAZOLE SODIUM 40 MG PO TBEC
40.0000 mg | DELAYED_RELEASE_TABLET | Freq: Two times a day (BID) | ORAL | Status: DC
  Administered 2022-12-24 – 2023-01-28 (×70): 40 mg via ORAL
  Filled 2022-12-24 (×70): qty 1

## 2022-12-24 MED ORDER — ACETAMINOPHEN 325 MG PO TABS
650.0000 mg | ORAL_TABLET | Freq: Four times a day (QID) | ORAL | Status: DC | PRN
  Administered 2022-12-25 – 2022-12-26 (×3): 650 mg via ORAL
  Filled 2022-12-24 (×3): qty 2

## 2022-12-24 MED ORDER — INSULIN ASPART 100 UNIT/ML IJ SOLN: 0.0000 [IU] | Freq: Three times a day (TID) | INTRAMUSCULAR | Status: DC

## 2022-12-24 MED ORDER — INSULIN ASPART 100 UNIT/ML IJ SOLN
0.0000 [IU] | Freq: Every day | INTRAMUSCULAR | Status: DC
  Administered 2022-12-24: 2 [IU] via SUBCUTANEOUS

## 2022-12-24 MED ORDER — ONDANSETRON HCL 4 MG PO TABS: 4.0000 mg | ORAL_TABLET | Freq: Four times a day (QID) | ORAL | Status: DC | PRN

## 2022-12-24 MED ORDER — LORATADINE 10 MG PO TABS
10.0000 mg | ORAL_TABLET | Freq: Every day | ORAL | Status: DC
  Administered 2022-12-25 – 2023-01-28 (×35): 10 mg via ORAL
  Filled 2022-12-24 (×35): qty 1

## 2022-12-24 MED ORDER — METRONIDAZOLE 500 MG/100ML IV SOLN
500.0000 mg | Freq: Two times a day (BID) | INTRAVENOUS | Status: DC
  Administered 2022-12-24: 500 mg via INTRAVENOUS
  Filled 2022-12-24: qty 100

## 2022-12-24 MED ORDER — ONDANSETRON HCL 4 MG/2ML IJ SOLN: 4.0000 mg | Freq: Four times a day (QID) | INTRAMUSCULAR | Status: DC | PRN

## 2022-12-24 MED ORDER — ALPRAZOLAM 0.5 MG PO TABS: 0.5000 mg | ORAL_TABLET | Freq: Once | ORAL | Status: DC

## 2022-12-24 MED ORDER — INSULIN GLARGINE-YFGN 100 UNIT/ML ~~LOC~~ SOLN
10.0000 [IU] | Freq: Two times a day (BID) | SUBCUTANEOUS | Status: DC
  Administered 2022-12-24: 10 [IU] via SUBCUTANEOUS
  Filled 2022-12-24 (×3): qty 0.1

## 2022-12-24 MED ORDER — SODIUM CHLORIDE 0.9% FLUSH
3.0000 mL | Freq: Two times a day (BID) | INTRAVENOUS | Status: DC
  Administered 2022-12-24 – 2023-01-24 (×40): 3 mL via INTRAVENOUS

## 2022-12-24 MED ORDER — HYDRALAZINE HCL 20 MG/ML IJ SOLN: 5.0000 mg | INTRAMUSCULAR | Status: DC | PRN

## 2022-12-24 MED ORDER — AZELASTINE HCL 0.1 % NA SOLN
1.0000 | Freq: Two times a day (BID) | NASAL | Status: DC
  Administered 2022-12-24 – 2023-01-28 (×62): 1 via NASAL
  Filled 2022-12-24: qty 30

## 2022-12-24 MED ORDER — TAMSULOSIN HCL 0.4 MG PO CAPS
0.4000 mg | ORAL_CAPSULE | Freq: Every day | ORAL | Status: DC
  Administered 2022-12-25 – 2023-01-28 (×35): 0.4 mg via ORAL
  Filled 2022-12-24 (×35): qty 1

## 2022-12-24 NOTE — Progress Notes (Signed)
Date and time results received: 12/24/22 1805 (use smartphrase ".now" to insert current time)  Test: lactic acid   Critical Value: 2.3  Name of Provider Notified: Ophelia Charter  Orders Received? Or Actions Taken?:  MD aware, no new orders

## 2022-12-24 NOTE — Consult Note (Signed)
Reason for Consult: Possible lumbar discitis osteomyelitis Referring Physician: Medicine  Jim Ramirez is an 72 y.o. male.  HPI: 72 year old male admitted with back pain with radiation to both lower extremities.  Symptoms have progressively worsened over the past few weeks although patient does have a history of chronic lumbar pain.  During evaluation at outside emergency department patient found to be significantly hypotensive.  Also with new onset A-fib.  Patient also found to be significantly dehydrated.  He continues to complain of lower back pain.  He has some radiating pain into his left anterior lateral thigh.  He denies any numbness.  He has no history of bowel or bladder dysfunction.  The patient's back pain does worsen with any movement.  He denies history of fever.  Patient is a prior patient of Dr. Lovell Sheehan who is performed multiple cervical surgeries and has previously evaluated him for lumbar pain.  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   CTA negative for aneurysm   Depressed    Diabetes mellitus without complication (HCC)    DVT (deep venous thrombosis) (HCC)    History of kidney stones    Hypertension    Nephrolithiasis    Sleep apnea     Past Surgical History:  Procedure Laterality Date   ANTERIOR CERVICAL DECOMP/DISCECTOMY FUSION N/A 05/22/2022   Procedure: ANTERIOR CERVICAL DISCECTOMY FUSION, INTERBODY PROSTHESIS,PLATE/SCREWS CERVICAL THREE-FOUR, CERVICAL FOUR-FIVE;REMOVAL CERVICAL PLATE;  Surgeon: Tressie Stalker, MD;  Location: [facility] OR;  Service: Neurosurgery;  Laterality: N/A;  3C   CATARACT EXTRACTION W/PHACO Left 09/23/2022   Procedure: CATARACT EXTRACTION PHACO AND INTRAOCULAR LENS PLACEMENT (IOC);  Surgeon: Fabio Pierce, MD;  Location: [facility];  Service: Ophthalmology;  Laterality: Left;  CDE: 7.52   CATARACT EXTRACTION W/PHACO Right 10/07/2022   Procedure: CATARACT EXTRACTION PHACO AND  INTRAOCULAR LENS PLACEMENT (IOC);  Surgeon: Fabio Pierce, MD;  Location: [facility];  Service: Ophthalmology;  Laterality: Right;  CDE: 9.40   STOMACH SURGERY     TONSILLECTOMY  2000    Family History  Problem Relation Age of Onset   Heart attack Mother    Heart attack Father    Hypertension Brother    Diabetes Brother    Coronary artery disease Brother     Social History:  reports that he quit smoking about 30 years ago. His smoking use included cigarettes. He has never been exposed to tobacco smoke. He has never used smokeless tobacco. He reports that he does not currently use alcohol. He reports that he does not use drugs.  Allergies: No Known Allergies  Medications: I have reviewed the patient's current medications.  Results for orders placed or performed during the hospital encounter of 12/24/22 (from the past 48 hour(s))  Glucose, capillary     Status: Abnormal   Collection Time: 12/24/22  4:30 PM  Result Value Ref Range   Glucose-Capillary 211 (H) 70 - 99 mg/dL    Comment: Glucose reference range applies only to samples taken after fasting for at least 8 hours.  CBC with Differential/Platelet     Status: Abnormal   Collection Time: 12/24/22  4:46 PM  Result Value Ref Range   WBC 12.1 (H) 4.0 - 10.5 K/uL   RBC 5.03 4.22 - 5.81 MIL/uL   Hemoglobin 12.6 (L) 13.0 - 17.0 g/dL   HCT 16.1 09.6 - 04.5 %   MCV 78.3 (L) 80.0 - 100.0 fL   MCH 25.0 (L) 26.0 - 34.0 pg  MCHC 32.0 30.0 - 36.0 g/dL   RDW 16.1 09.6 - 04.5 %   Platelets PLATELET CLUMPS NOTED ON SMEAR, UNABLE TO ESTIMATE 150 - 400 K/uL    Comment: Immature Platelet Fraction may be clinically indicated, consider ordering this additional test [id number]    nRBC 0.0 0.0 - 0.2 %   Neutrophils Relative % 86 %   Neutro Abs 10.5 (H) 1.7 - 7.7 K/uL   Lymphocytes Relative 6 %   Lymphs Abs 0.7 0.7 - 4.0 K/uL   Monocytes Relative 8 %   Monocytes Absolute 0.9 0.1 - 1.0 K/uL   Eosinophils Relative 0 %   Eosinophils Absolute  0.0 0.0 - 0.5 K/uL   Basophils Relative 0 %   Basophils Absolute 0.0 0.0 - 0.1 K/uL   WBC Morphology Mild Left Shift (1-5% metas, occ myelo)    RBC Morphology See Note    Immature Granulocytes 0 %   Abs Immature Granulocytes 0.03 0.00 - 0.07 K/uL   Ovalocytes PRESENT     Comment: Performed at [facility], [address], [place], [place] [address]  Comprehensive metabolic panel     Status: Abnormal   Collection Time: 12/24/22  4:46 PM  Result Value Ref Range   Sodium 139 135 - 145 mmol/L   Potassium 3.8 3.5 - 5.1 mmol/L   Chloride 104 98 - 111 mmol/L   CO2 17 (L) 22 - 32 mmol/L   Glucose, Bld 218 (H) 70 - 99 mg/dL    Comment: Glucose reference range applies only to samples taken after fasting for at least 8 hours.   BUN 54 (H) 8 - 23 mg/dL   Creatinine, Ser 5.62 0.61 - 1.24 mg/dL   Calcium 8.4 (L) 8.9 - 10.3 mg/dL   Total Protein 5.6 (L) 6.5 - 8.1 g/dL   Albumin 1.9 (L) 3.5 - 5.0 g/dL   AST 92 (H) 15 - 41 U/L   ALT 34 0 - 44 U/L   Alkaline Phosphatase 70 38 - 126 U/L   Total Bilirubin 1.9 (H) 0.3 - 1.2 mg/dL   GFR, Estimated >13 >08 mL/min    Comment: (NOTE) Calculated using the CKD-EPI Creatinine Equation (2021)    Anion gap 18 (H) 5 - 15    Comment: ELECTROLYTES REPEATED TO VERIFY Performed at [facility], [address], [place], [place] [address]   Lactic acid, plasma     Status: Abnormal   Collection Time: 12/24/22  4:46 PM  Result Value Ref Range   Lactic Acid, Venous 2.3 (HH) 0.5 - 1.9 mmol/L    Comment: CRITICAL RESULT CALLED TO, READ BACK BY AND VERIFIED WITH J.GARDNER,RN @1801  12/24/2022 VANG.J Performed at [facility], [address], [place], [place] [address]   Troponin I (High Sensitivity)     Status: Abnormal   Collection Time: 12/24/22  4:46 PM  Result Value Ref Range   Troponin I (High Sensitivity) 227 (HH) <18 ng/L    Comment: CRITICAL RESULT CALLED TO, READ BACK BY AND VERIFIED WITH J.GARDNER,RN @1812  12/24/2022  VANG.J (NOTE) Elevated high sensitivity troponin I (hsTnI) values and significant  changes across serial measurements may suggest ACS but many other  chronic and acute conditions are known to elevate hsTnI results.  Refer to the "Links" section for chest pain algorithms and additional  guidance. Performed at [facility], [address] [place], [place] [address]   Sedimentation rate     Status: Abnormal   Collection Time:  12/24/22  4:46 PM  Result Value Ref Range   Sed Rate 43 (H) 0 - 16 mm/hr    Comment: Performed at [facility], [address], [place], [place] [address]    No results found.  Review of systems not obtained due to patient factors. Blood pressure (!) 90/47, pulse 84, temperature 97.6 F (36.4 C), temperature source Axillary, resp. rate (!) 21, SpO2 94 %. Patient appears toxic.  Mucous membranes are dry.  His voice is very weak.  He is awake and aware.  He answers questions appropriately.  He is oriented.  Cranial nerve function is normal bilaterally.  Speech is fluent.  The patient has generalized weakness but moves all 4 extremities to command with good lower extremity motor strength to direct testing.  He does have pain with straight raising bilaterally.  He has some diffuse abdominal tenderness.  Examination head ears eyes nose throat heart.    Assessment/Plan: Patient with significant lumbar pain with radicular symptoms bilaterally.  CT scan of his lumbar spine done at [facility] demonstrates significant multilevel degenerative disease.  There are multiple areas of vacuum disc phenomenon however at the L4-5 level the patient has gas pockets extending into his vertebral bodies.  Evaluation of the canal is limited however there is not appear to be any critical stenosis or obvious abscess.  There is no evidence of significant paravertebral phlegmon although the surrounding paraspinal muscles have abnormal signal characteristics worrisome for infection.   Sedimentation rate is elevated but only mildly so and not particularly consistent with osteomyelitis discitis.  MRI scan pending.  Certainly there is concern of osteomyelitis discitis although I am not able to appreciate a severe compressive abscess at present.  We will follow-up once the MRI scan has been performed.  Dr. Lovell Sheehan will assume care tomorrow.  Temple Pacini 12/24/2022, 6:39 PM

## 2022-12-24 NOTE — H&P (Signed)
History and Physical    Patient: Jim Ramirez:[id number] DOB: 07-08-1951 DOA: 12/24/2022 DOS: the patient was seen and examined on 12/24/2022 PCP: [name], MD  Patient coming from: Home - lives with brother; NOK: Jim, Ramirez, [phone]   Chief Complaint: leg weakness  HPI: Jim Ramirez is a 72 y.o. male with medical history significant of HTN and DM presenting with back pain.   He initially presented to [facility] on 5/26 with c/o chronic back pain with radiation down both legs.  He also noted SOB x 1 week (has 30 year h/o smoking).  He was noted to have low BP and was told to hold BP meds and was prescribed Lidoderm patches.  He returned on 5/27 with back pain with tachycardia, SOB.  Per the EDP note, he was treated with "possible septicemia dehydration" with "plan to transfer out for presumed non-Q wave myocardial infarction in the setting of worsening renal insufficiency".  He was started on antibiotics but was given limited IVF since he "may have some mild congestive heart failure".  He had subsequent lumbar spine MRI with "possible osteomyelitis with gas-forming organism."  He then developed afib with RVR and was started on Dilt without relief and then Amiodarone drip and eventually converted back to NSR en route.  The patient has extremely dry mucus membranes and is only to whisper.  He verifies most of the above history.  Denies h/o back surgery but he did have 3 prior neck surgeries with Dr. Lovell Sheehan.  No h/o afib.  No CP.  He denies SOB although the nurse reports that he was on 4L [place] O2 on arrival.      ER Course:  [facility] to [facility] transfer, per Dr. Imogene Burn:  72 yo WM seen 2 days in a row at [facility] ED for back pain and SOB. Seen yesterday and discharged, back in ER today. pt fell last week at [facility] and has had increased back pain.  Also with increased SOB.  Today has had elevated troponin to 159. yesterday was 34. WBC 11.1, BNP 1122. no prior hx of CHF.  lactic acid of 5.5.  CT lumbar shows pockets of free air near ant aspect of L4 with surrounding edema. also gas in L4-5 vertebral bodies. concern for discitis.  asked EDP to give IV vanco. pt had received IV rocephin/flagyl.  I was called back later today with concern for afib with RVR.   He was started on Dilt drip without success.  He is now on Amiodarone drip.  +tachypnea, on HFNC O2, 10L (for respiratory distress more than hypoxia).     Review of Systems: As mentioned in the history of present illness. All other systems reviewed and are negative. 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   CTA negative for aneurysm   Depressed    Diabetes mellitus without complication (HCC)    DVT (deep venous thrombosis) (HCC)    History of kidney stones    Hypertension    Nephrolithiasis    Sleep apnea    Past Surgical History:  Procedure Laterality Date   ANTERIOR CERVICAL DECOMP/DISCECTOMY FUSION N/A 05/22/2022   Procedure: ANTERIOR CERVICAL DISCECTOMY FUSION, INTERBODY PROSTHESIS,PLATE/SCREWS CERVICAL THREE-FOUR, CERVICAL FOUR-FIVE;REMOVAL CERVICAL PLATE;  Surgeon: Tressie Stalker, MD;  Location: [facility] OR;  Service: Neurosurgery;  Laterality: N/A;  3C   CATARACT EXTRACTION W/PHACO Left 09/23/2022   Procedure: CATARACT EXTRACTION PHACO AND INTRAOCULAR LENS PLACEMENT (IOC);  Surgeon: Fabio Pierce, MD;  Location: [facility] ORS;  Service: Ophthalmology;  Laterality: Left;  CDE: 7.52   CATARACT EXTRACTION W/PHACO Right 10/07/2022   Procedure: CATARACT EXTRACTION PHACO AND INTRAOCULAR LENS PLACEMENT (IOC);  Surgeon: Fabio Pierce, MD;  Location: [facility] ORS;  Service: Ophthalmology;  Laterality: Right;  CDE: 9.40   STOMACH SURGERY     TONSILLECTOMY  2000   Social History:  reports that he quit smoking about 30 years ago. His smoking use included cigarettes. He has never been exposed to tobacco smoke. He has never used smokeless tobacco. He reports that he does not  currently use alcohol. He reports that he does not use drugs.  No Known Allergies  Family History  Problem Relation Age of Onset   Heart attack Mother    Heart attack Father    Hypertension Brother    Diabetes Brother    Coronary artery disease Brother     Prior to Admission medications   Medication Sig Start Date End Date Taking? Authorizing Provider  azelastine (ASTELIN) 0.1 % nasal spray Place 1 spray into both nostrils 2 (two) times daily. Use in each nostril as directed    [provider]  celecoxib (CELEBREX) 200 MG capsule Take 200 mg by mouth daily. 04/07/22   [provider]  Clobetasol Propionate 0.05 % lotion Apply 1 Application topically 2 (two) times daily as needed (psoriasis).    [provider]  cyanocobalamin (VITAMIN B12) 1000 MCG/ML injection Inject 1,000 mcg into the muscle every 30 (thirty) days. 03/12/22   [provider]  cyclobenzaprine (FLEXERIL) 5 MG tablet Take 1 tablet (5 mg total) by mouth 3 (three) times daily as needed for muscle spasms. 05/23/22   Val Eagle D, NP  docusate sodium (COLACE) 100 MG capsule Take 1 capsule (100 mg total) by mouth 2 (two) times daily. 05/23/22   Val Eagle D, NP  empagliflozin (JARDIANCE) 25 MG TABS tablet Take 25 mg by mouth daily.    [provider]  escitalopram (LEXAPRO) 10 MG tablet Take 10 mg by mouth daily.    [provider]  esomeprazole (NEXIUM) 40 MG capsule Take 40 mg by mouth 2 (two) times daily.    [provider]  hydrochlorothiazide (HYDRODIURIL) 25 MG tablet Take 25 mg by mouth daily.    [provider]  insulin NPH-regular Human (70-30) 100 UNIT/ML injection Inject 20-50 Units into the skin See admin instructions. Inject 50 units in the morning and 20 units in the evening    [provider]  levocetirizine (XYZAL) 5 MG tablet Take 5 mg by mouth every evening.    [provider]  metFORMIN (GLUCOPHAGE) 1000 MG tablet  Take 1,000 mg by mouth 2 (two) times daily with a meal.    [provider]  metoprolol succinate (TOPROL-XL) 50 MG 24 hr tablet Take 50 mg by mouth daily. 04/07/22   [provider]  mometasone (ELOCON) 0.1 % ointment Apply 1 Application topically 2 (two) times daily as needed for rash. 04/07/22   [provider]  oxyCODONE-acetaminophen (PERCOCET) 5-325 MG tablet Take 1-2 tablets by mouth every 4 (four) hours as needed for severe pain. 05/23/22 05/23/23  Val Eagle D, NP  rosuvastatin (CRESTOR) 20 MG tablet Take 20 mg by mouth daily. 04/07/22   [provider]  tamsulosin (FLOMAX) 0.4 MG CAPS capsule Take 0.4 mg by mouth daily.    [provider]  trandolapril (MAVIK) 2 MG tablet Take 2 mg by mouth daily.    [provider]  zolpidem (AMBIEN) 10 MG tablet Take 20 mg by mouth at bedtime. 02/13/22   [provider]    Physical Exam: Vitals:   12/24/22 1610  BP: (!) 90/47  Pulse: 84  Resp: (!) 21  Temp: 97.6 F (36.4 C)  TempSrc: Axillary  SpO2: 94%   General:  Appears calm and comfortable and is in NAD, somewhat ill-appearing, does not raise voice above a whisper Eyes:   EOMI, normal lids, iris ENT:  grossly normal hearing, lips & dry tongue, moderately dry mm Neck:  no LAD, masses or thyromegaly Cardiovascular:  RRR, no m/r/g. No LE edema.  NSR confirmed on telemetry. Respiratory:   CTA bilaterally with no wheezes/rales/rhonchi.  Normal respiratory effort on [place] O2 Abdomen:  soft, NT, ND Skin:  no rash or induration seen on limited exam Musculoskeletal:  equal but diminished strength BLE 3-4/6, no bony abnormality; back exam by RN without apparent deformity or midline TTP Psychiatric:  blunted mood and affect, speech whispered but appropriate, AOx3 Neurologic:  CN 2-12 grossly intact, moves all extremities in coordinated fashion   Radiological Exams on Admission: Independently reviewed - see discussion in A/P where  applicable  CXR on 5/27 - NAD  Head CT on 5/28 - no acute intracranial abnormality  CTA Chest on 5/28 - No PE, no consolidation/effusion, CAD  CT L-spine on 5/28 - 1. Multiple rounded pockets of free air about the anterior aspect of the L4 vertebral body with surrounding edema. There is also multiple rounded pockets of gas in the L4 and L5 vertebral bodies. Findings are concerning for osteomyelitis/discitis with gas-forming  organisms. MRI examination with and without contrast for further  evaluation is suggested.  2. Multilevel degenerative disc disease with disc osteophyte  complexes and disc osteophyte complexes with moderate lateral recess  stenosis bilaterally at L2-L3, L3-L4, L4-L5 and L5-S1.   EKG: Independently reviewed.  NSR with rate 88; prolonged QTc 503; RBBB with no evidence of acute ischemia   Labs on Admission: I have personally reviewed the available labs and imaging studies at the time of the admission.  Pertinent labs:    CO2 17 Glucose 218 BUN 54/Creatinine 1.23/GFR >60 - improved from 58/1.37/55 on 5/27 Anion gap 18 AST 92/ALT 34/Bili 1.9 HS troponin 227, 151 at [facility] on 5/27 Lactate 2.3, improved from 5.5 on 5/27 WBC 12.1 Hgb 12.6 Blood cultures pending CRP, ESR pending (ESR 66 on 5/26) Procalcitonin pending   Assessment and Plan: Principal Problem:   Discitis of lumbar region Active Problems:   Diabetes mellitus type 2 in nonobese (HCC)   Dyslipidemia   Essential hypertension   New onset atrial fibrillation (HCC)   Sepsis (HCC)   BPH (benign prostatic hyperplasia)   Mood disorder (HCC)    Lumbar spine discitis/osteomyelitis with sepsis -Sepsis indicates life-threatening organ dysfunction with mortality >10%, caused by dysregulation to host response.   -SIRS criteria in this patient includes: Leukocytosis, tachycardia, tachypnea  -Patient has evidence of acute organ failure with elevated lactate >2 that is not easily explained by another  condition. -While awaiting blood cultures, this appears to be a preseptic condition. -Sepsis protocol initiated -Suspected source is L4-5 discitis/osteomyelitis, possibly from a gas-forming organism -Blood and urine cultures pending -Will admit due to: hemodynamic instability -Treat with Flagyl/Cefepime/Vanc  -Will trend lactate to ensure improvement -Will order procalcitonin level.  -Concerning lab findings include CRP >13.5, elevated ESR (goal is < 1/2 age +82 if male) - both are pending.  WBC is often <15,  which is the case here. -Will order MRI for further evaluation. -Will need antibiotics for 4-6 weeks. -With vertebral osteo, needs testing for TB - will order quantiferon gold. -Further important considerations include nutrition, diabetes control, tobacco cessation. -Will request neurosurgical consultation - Dr. Jordan Likes was consulted since he is on call but he was also notified that prior C-spine surgery was by Dr. Lovell Sheehan. -ID notified via Secure Chat, requested to see on 5/29 -While he may benefit from IR biopsy vs. Surgical biopsy, will go ahead and continue abx given hemodynamic instability of the patient overnight, the fact that he has already received prolonged abx, and concern for gas-forming organisms -BLE weakness; likely to need PT when this issue is stabilized  New onset afib  -Patient presenting with new-onset afib.  -Etiology is thought to be related to infection, as above -He was started on dilt at OSH without improvement -He was transitioned to amiodarone, which led to hemodynamic stabilization and then eventually to conversion to NSR -Will attempt to wean amio to off at this time -Will admit to progressive care for close ongoing monitoring -HS troponin mildly elevated and continuing to increase; patient is without chest pain so will follow for now with repeat troponin and will not start [facility] given return to NSR as well as possible need for intervention   -Will request  Echocardiogram for further evaluation  -Plan for outpatient cardiology f/u as of now (unless new issues arise and/or cardiac clearance is needed)  HTN -Marginal BPs - thought to be related to infection + amiodarone + dehydration -Hold HCTZ, Toprol XL, trandolapril -Resume Toprol pre-operatively if possible -Will also add prn hydralazine  HLD -Continue rosuvastatin -Mild LFT elevation - possibly from this vs shock liver from infection, will follow  DM -Last A1c was 8.1 remotely, will repeat -Hold metformin, Jardiance -He is on 70/30 at home and takes 50 units qAM and 20 units qhs -Will order glargine 10 units BID for now -Will cover with moderate-scale SSI for now  BPH -Continue tamsulosin  Mood d/o -Continue escitalopram -Hold Ambien - he takes 20 mg qhs which is 4x the max allowed in our system given his age    Advance Care Planning:   Code Status: Full Code - Code status was discussed with the patient and/or family at the time of admission.  The patient would want to receive full resuscitative measures at this time.   Consults: Neurosurgery; may need cardiology if afib recurs  DVT Prophylaxis: SCDs  Family Communication: None present; he declined to have me call family at the time of admission  Severity of Illness: The appropriate patient status for this patient is INPATIENT. Inpatient status is judged to be reasonable and necessary in order to provide the required intensity of service to ensure the patient's safety. The patient's presenting symptoms, physical exam findings, and initial radiographic and laboratory data in the context of their chronic comorbidities is felt to place them at high risk for further clinical deterioration. Furthermore, it is not anticipated that the patient will be medically stable for discharge from the hospital within 2 midnights of admission.   * I certify that at the point of admission it is my clinical judgment that the patient will require  inpatient hospital care spanning beyond 2 midnights from the point of admission due to high intensity of service, high risk for further deterioration and high frequency of surveillance required.*  Author: Jonah Blue, MD 12/24/2022 6:51 PM  For on call review [email or web address].

## 2022-12-24 NOTE — Progress Notes (Signed)
Date and time results received: 12/24/22 1810 (use smartphrase ".now" to insert current time)  Test: Troponin Critical Value: 227  Name of Provider Notified: Ophelia Charter  Orders Received? Or Actions Taken?:  Notified MD, no new orders

## 2022-12-24 NOTE — Progress Notes (Signed)
Pharmacy Antibiotic Note  Jim Ramirez is a 72 y.o. male admitted on 12/24/2022 with sepsis, concern for lumbar spine discitis/osteomyelitis.  Pharmacy has been consulted for vancomycin and cefepime dosing; also on flagyl per MD.  Plan: Cefepime 2g IV q8h / Flagyl 500mg  IV q12h Vancomycin 1750mg  IV q24h for estimated AUC 467 using SCr 1.23 Check vancomycin levels at steady state, goal AUC 400-550 Follow up renal function & cultures    Temp (24hrs), Avg:97.6 F (36.4 C), Min:97.6 F (36.4 C), Max:97.6 F (36.4 C)  Recent Labs  Lab 12/24/22 1646  WBC 12.1*  CREATININE 1.23  LATICACIDVEN 2.3*    CrCl cannot be calculated (Unknown ideal weight.).    No Known Allergies  Antimicrobials this admission: 5/28 Vanc >> 5/28 Cefepime >> 5/28 Flagyl >>  Dose adjustments this admission:  Microbiology results: 5/28 BCx: 5/28 MRSA PCR:  Thank you for allowing pharmacy to be a part of this patient's care.  Loralee Pacas, PharmD, BCPS 12/24/2022 7:08 PM  Please check AMION for all [facility] phone numbers After 10:00 PM, call Main Pharmacy [phone]

## 2022-12-24 NOTE — Progress Notes (Signed)
Verbal order given by Dr.Yates to titrate Amio from 60mg /hr to 30mg /hr and then to 15mg /hr and to turn off

## 2022-12-25 ENCOUNTER — Inpatient Hospital Stay (HOSPITAL_COMMUNITY): Payer: Medicare HMO

## 2022-12-25 DIAGNOSIS — R0609 Other forms of dyspnea: Secondary | ICD-10-CM

## 2022-12-25 DIAGNOSIS — M4646 Discitis, unspecified, lumbar region: Secondary | ICD-10-CM | POA: Diagnosis not present

## 2022-12-25 DIAGNOSIS — A419 Sepsis, unspecified organism: Secondary | ICD-10-CM | POA: Diagnosis not present

## 2022-12-25 DIAGNOSIS — I4891 Unspecified atrial fibrillation: Secondary | ICD-10-CM

## 2022-12-25 DIAGNOSIS — Z794 Long term (current) use of insulin: Secondary | ICD-10-CM

## 2022-12-25 DIAGNOSIS — E119 Type 2 diabetes mellitus without complications: Secondary | ICD-10-CM | POA: Diagnosis not present

## 2022-12-25 DIAGNOSIS — R7989 Other specified abnormal findings of blood chemistry: Secondary | ICD-10-CM

## 2022-12-25 LAB — CBC
HCT: 37.9 % — ABNORMAL LOW (ref 39.0–52.0)
Hemoglobin: 12.3 g/dL — ABNORMAL LOW (ref 13.0–17.0)
MCH: 25.9 pg — ABNORMAL LOW (ref 26.0–34.0)
MCHC: 32.5 g/dL (ref 30.0–36.0)
MCV: 80 fL (ref 80.0–100.0)
Platelets: UNDETERMINED 10*3/uL (ref 150–400)
RBC: 4.74 MIL/uL (ref 4.22–5.81)
RDW: 15.3 % (ref 11.5–15.5)
WBC: 12.1 10*3/uL — ABNORMAL HIGH (ref 4.0–10.5)
nRBC: 0 % (ref 0.0–0.2)

## 2022-12-25 LAB — COMPREHENSIVE METABOLIC PANEL
ALT: 41 U/L (ref 0–44)
AST: 110 U/L — ABNORMAL HIGH (ref 15–41)
Albumin: 1.8 g/dL — ABNORMAL LOW (ref 3.5–5.0)
Alkaline Phosphatase: 76 U/L (ref 38–126)
Anion gap: 16 — ABNORMAL HIGH (ref 5–15)
BUN: 56 mg/dL — ABNORMAL HIGH (ref 8–23)
CO2: 18 mmol/L — ABNORMAL LOW (ref 22–32)
Calcium: 8.2 mg/dL — ABNORMAL LOW (ref 8.9–10.3)
Chloride: 102 mmol/L (ref 98–111)
Creatinine, Ser: 1.28 mg/dL — ABNORMAL HIGH (ref 0.61–1.24)
GFR, Estimated: >60 mL/min (ref >60)
Glucose, Bld: 213 mg/dL — ABNORMAL HIGH (ref 70–99)
Potassium: 3.5 mmol/L (ref 3.5–5.1)
Sodium: 136 mmol/L (ref 135–145)
Total Bilirubin: 1.8 mg/dL — ABNORMAL HIGH (ref 0.3–1.2)
Total Protein: 5.7 g/dL — ABNORMAL LOW (ref 6.5–8.1)

## 2022-12-25 LAB — GLUCOSE, CAPILLARY
Glucose-Capillary: 192 mg/dL — ABNORMAL HIGH (ref 70–99)
Glucose-Capillary: 203 mg/dL — ABNORMAL HIGH (ref 70–99)
Glucose-Capillary: 183 mg/dL — ABNORMAL HIGH (ref 70–99)
Glucose-Capillary: 196 mg/dL — ABNORMAL HIGH (ref 70–99)
Glucose-Capillary: 181 mg/dL — ABNORMAL HIGH (ref 70–99)
Glucose-Capillary: 177 mg/dL — ABNORMAL HIGH (ref 70–99)

## 2022-12-25 LAB — CBC WITH DIFFERENTIAL/PLATELET
Abs Immature Granulocytes: 0.19 10*3/uL — ABNORMAL HIGH (ref 0.00–0.07)
Basophils Absolute: 0 10*3/uL (ref 0.0–0.1)
Basophils Relative: 0 %
Eosinophils Absolute: 0 10*3/uL (ref 0.0–0.5)
Eosinophils Relative: 0 %
HCT: 36.7 % — ABNORMAL LOW (ref 39.0–52.0)
Hemoglobin: 11.8 g/dL — ABNORMAL LOW (ref 13.0–17.0)
Immature Granulocytes: 1 %
Lymphocytes Relative: 5 %
Lymphs Abs: 0.8 10*3/uL (ref 0.7–4.0)
MCH: 24.9 pg — ABNORMAL LOW (ref 26.0–34.0)
MCHC: 32.2 g/dL (ref 30.0–36.0)
MCV: 77.6 fL — ABNORMAL LOW (ref 80.0–100.0)
Monocytes Absolute: 0.7 10*3/uL (ref 0.1–1.0)
Monocytes Relative: 5 %
Neutro Abs: 13.3 10*3/uL — ABNORMAL HIGH (ref 1.7–7.7)
Neutrophils Relative %: 89 %
Platelets: DECREASED 10*3/uL (ref 150–400)
RBC: 4.73 MIL/uL (ref 4.22–5.81)
RDW: 15.5 % (ref 11.5–15.5)
WBC: 15 10*3/uL — ABNORMAL HIGH (ref 4.0–10.5)
nRBC: 0.1 % (ref 0.0–0.2)

## 2022-12-25 LAB — ECHOCARDIOGRAM COMPLETE
AR max vel: 2.7 cm2
AV Area VTI: 2.33 cm2
AV Area mean vel: 2.12 cm2
AV Mean grad: 8 mmHg
AV Peak grad: 10.8 mmHg
Ao pk vel: 1.64 m/s
Area-P 1/2: 3.74 cm2
S' Lateral: 3.2 cm

## 2022-12-25 LAB — HEPATITIS B SURFACE ANTIBODY,QUALITATIVE: Hep B S Ab: NONREACTIVE

## 2022-12-25 LAB — HEPATITIS B SURFACE ANTIGEN: Hepatitis B Surface Ag: NONREACTIVE

## 2022-12-25 LAB — CULTURE, BLOOD (ROUTINE X 2): Special Requests: ADEQUATE

## 2022-12-25 LAB — HEPATITIS B CORE ANTIBODY, TOTAL: Hep B Core Total Ab: NONREACTIVE

## 2022-12-25 LAB — HEPATITIS C ANTIBODY: HCV Ab: NONREACTIVE

## 2022-12-25 LAB — MAGNESIUM: Magnesium: 2.8 mg/dL — ABNORMAL HIGH (ref 1.7–2.4)

## 2022-12-25 MED ORDER — METOPROLOL TARTRATE 5 MG/5ML IV SOLN
5.0000 mg | INTRAVENOUS | Status: DC | PRN
  Administered 2022-12-26: 5 mg via INTRAVENOUS
  Filled 2022-12-25: qty 5

## 2022-12-25 MED ORDER — MAGNESIUM SULFATE 2 GM/50ML IV SOLN
2.0000 g | Freq: Once | INTRAVENOUS | Status: AC
  Administered 2022-12-25: 2 g via INTRAVENOUS
  Filled 2022-12-25: qty 50

## 2022-12-25 MED ORDER — GADOBUTROL 1 MMOL/ML IV SOLN
9.0000 mL | Freq: Once | INTRAVENOUS | Status: AC | PRN
  Administered 2022-12-25: 9 mL via INTRAVENOUS

## 2022-12-25 MED ORDER — AMIODARONE HCL IN DEXTROSE 360-4.14 MG/200ML-% IV SOLN
30.0000 mg/h | INTRAVENOUS | Status: DC
  Administered 2022-12-25 – 2022-12-29 (×9): 30 mg/h via INTRAVENOUS
  Filled 2022-12-25 (×9): qty 200

## 2022-12-25 MED ORDER — POTASSIUM CHLORIDE CRYS ER 20 MEQ PO TBCR
40.0000 meq | EXTENDED_RELEASE_TABLET | ORAL | Status: AC
  Administered 2022-12-25: 40 meq via ORAL
  Filled 2022-12-25: qty 2

## 2022-12-25 MED ORDER — LACTATED RINGERS IV SOLN: INTRAVENOUS | Status: DC

## 2022-12-25 MED ORDER — AMIODARONE HCL IN DEXTROSE 360-4.14 MG/200ML-% IV SOLN
60.0000 mg/h | INTRAVENOUS | Status: DC
  Administered 2022-12-25: 60 mg/h via INTRAVENOUS

## 2022-12-25 MED ORDER — VANCOMYCIN HCL 1750 MG/350ML IV SOLN
1750.0000 mg | INTRAVENOUS | Status: DC
  Administered 2022-12-25: 1750 mg via INTRAVENOUS
  Filled 2022-12-25: qty 350

## 2022-12-25 MED ORDER — INSULIN ASPART 100 UNIT/ML IJ SOLN
0.0000 [IU] | INTRAMUSCULAR | Status: DC
  Administered 2022-12-25 – 2022-12-26 (×5): 3 [IU] via SUBCUTANEOUS
  Administered 2022-12-26: 5 [IU] via SUBCUTANEOUS

## 2022-12-25 MED ORDER — SODIUM CHLORIDE 0.9 % IV SOLN: 2.0000 g | INTRAVENOUS | Status: DC

## 2022-12-25 NOTE — Progress Notes (Signed)
  X-cover Note: Pt back into rapid afib. Had been on IV amio gtts prior to admission. Stopped today. Will restart amio infusion without bolus.  Carollee Herter, DO [facility]

## 2022-12-25 NOTE — Plan of Care (Signed)
  Problem: Education: Goal: Knowledge of General Education information will improve Description: Including pain rating scale, medication(s)/side effects and non-pharmacologic comfort measures Outcome: Progressing   Problem: Health Behavior/Discharge Planning: Goal: Ability to manage health-related needs will improve Outcome: Progressing   Problem: Clinical Measurements: Goal: Ability to maintain clinical measurements within normal limits will improve Outcome: Progressing Goal: Respiratory complications will improve Outcome: Progressing Goal: Cardiovascular complication will be avoided Outcome: Progressing   Problem: Nutrition: Goal: Adequate nutrition will be maintained Outcome: Progressing   

## 2022-12-25 NOTE — Consult Note (Signed)
Cardiology Consultation   Patient ID: Jim Ramirez MRN: [id number]; DOB: [birth year]  Admit date: 12/24/2022 Date of Consult: 12/25/2022  PCP:  [name], MD   [facility] Providers Cardiologist:  Dina Rich, MD     Patient Profile:   Jim Ramirez is a 72 y.o. male with a history of dyspnea on exertion with negative Myoview in 07/2022, hypertension, type 2 diabetes mellitus, DVT, spontaneous subarachnoid intracranial hemorrhage in 02/2017, and sleep apnea  who is being seen 12/25/2022 for the evaluation of atrial fibrillation at the request of Dr. Allena Katz.  History of Present Illness:   Jim Ramirez is a 72 year old male with the above history who is followed by Dr. Dina Rich.  He was referred to Dr. Wyline Mood in 07/2022 for further evaluation of dyspnea on exertion over the last few months.Prior Echo in 05/2022 showed LVEF of 55-60% with grade 1 diastolic dysfunction and no significant valvular disease.  An exercise Myoview was ordered for further evaluation and was low risk.  He was able to complete 7.0 METS according to Bruce protocol and had no evidence of ischemia on ECG or perfusion study.  Dyspnea was not felt to be cardiac in nature and PFTs were recommended.  He was ultimately referred to Pulmonology due to abnormal PFTs.  For, he has not been seen by them yet.  Patient was in the [facility] ED on 12/22/2022 for chronic back pain with radiation down both legs.  He also reported being short of breath for the past week.  He was also noted to be hypotensive in the ED home. Chest x-ray and chest/ abdominal/ pelvic CT showed no acute findings.  High-sensitivity troponin was negative x 2.  Pro BNP was elevated at 1, 122.  Otherwise, labs were remarkable for leukocytosis with WBC of 14.6, thrombocytopenia with platelets of 59,  mild hyponatremia with Na of 133, mild hypokalemia with K of 3.1, low CO2 at 17.7, BUN 58, creatinine 1.37, albumin 2.2, elevated LFTs (AST of  65, Alk Phos of 139, and Total Bili of 1.4), elevated anion gap of >he was given a Lidoderm patch and advised to hold his home BP medications and referred to outpatient Ortho.  He presented back to the ED the following day for back pain and was noted to be tachycardic.  EKG showed new onset atrial fibrillation with RVR. Chest CTA was negative for PE.  CT of lumbar spine showed multiple rounded pockets of free air about the anterior aspect of the L4 vertebral body with surrounding edema as well as multiple rounded pockets of gas in the L4 and L5 vertebral bodies concerning for osteomyelitis/discitis.  Sensitive troponin was not elevated at 151  >> 176. Pro BNP 517. Lactic acid 5.5.  He was started on IV antibiotics.  He was initially started on an IV diltiazem for his rapid atrial fibrillation without success so he was started on Amiodarone. He was transferred to [facility] for further management of osteomyelitis/ discitis and Cardiology consulted for atrial fibrillation.  Patient states it was back pain and shortness of breath that initially led him to come to the ED. He reports that he has had dyspnea on exertion dating back to the fall of 2023. He states prior to this he was walking 12 to 13 miles without any issues and then suddenly he was not able to do this anymore. He states this dyspnea has progressed some over the last couple of months but overall it does not sounds  like he had any severe worsening of his breathing in the last couple of days or weeks prior to admission. He also reports occasional shortness of breath at rest but denies any orthopnea, PND, or lower extremity edema. He notes some chest pain when he gets acutely short of breath but no chest pain outside of these times. He also describes some palpitations when he is acutely short of breath but it does not sound like he has had any recent lightheadedness, dizziness, or syncope. It was difficult to get much more of a history for him as patient is  profoundly weak and tires easily. He seems to get worn out even talking with me and would frequently fall asleep.   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   CTA negative for aneurysm   Depressed    Diabetes mellitus without complication (HCC)    DVT (deep venous thrombosis) (HCC)    History of kidney stones    Hypertension    Nephrolithiasis    Sleep apnea     Past Surgical History:  Procedure Laterality Date   ANTERIOR CERVICAL DECOMP/DISCECTOMY FUSION N/A 05/22/2022   Procedure: ANTERIOR CERVICAL DISCECTOMY FUSION, INTERBODY PROSTHESIS,PLATE/SCREWS CERVICAL THREE-FOUR, CERVICAL FOUR-FIVE;REMOVAL CERVICAL PLATE;  Surgeon: Tressie Stalker, MD;  Location: [facility] OR;  Service: Neurosurgery;  Laterality: N/A;  3C   CATARACT EXTRACTION W/PHACO Left 09/23/2022   Procedure: CATARACT EXTRACTION PHACO AND INTRAOCULAR LENS PLACEMENT (IOC);  Surgeon: Fabio Pierce, MD;  Location: [facility] ORS;  Service: Ophthalmology;  Laterality: Left;  CDE: 7.52   CATARACT EXTRACTION W/PHACO Right 10/07/2022   Procedure: CATARACT EXTRACTION PHACO AND INTRAOCULAR LENS PLACEMENT (IOC);  Surgeon: Fabio Pierce, MD;  Location: [facility] ORS;  Service: Ophthalmology;  Laterality: Right;  CDE: 9.40   STOMACH SURGERY     TONSILLECTOMY  2000     Home Medications:  Prior to Admission medications   Medication Sig Start Date End Date Taking? Authorizing Provider  azelastine (ASTELIN) 0.1 % nasal spray Place 1 spray into both nostrils 2 (two) times daily. Use in each nostril as directed   Yes [provider]  celecoxib (CELEBREX) 200 MG capsule Take 200 mg by mouth daily. 04/07/22  Yes [provider]  cyanocobalamin (VITAMIN B12) 1000 MCG/ML injection Inject 1,000 mcg into the muscle every 30 (thirty) days. 03/12/22  Yes [provider]  docusate sodium (COLACE) 100 MG capsule Take 1 capsule (100 mg total) by mouth 2 (two) times daily.  05/23/22  Yes Val Eagle D, NP  empagliflozin (JARDIANCE) 25 MG TABS tablet Take 25 mg by mouth daily.   Yes [provider]  escitalopram (LEXAPRO) 10 MG tablet Take 10 mg by mouth daily.   Yes [provider]  esomeprazole (NEXIUM) 40 MG capsule Take 40 mg by mouth 2 (two) times daily.   Yes [provider]  hydrochlorothiazide (HYDRODIURIL) 25 MG tablet Take 25 mg by mouth daily.   Yes [provider]  HYDROcodone-acetaminophen (NORCO) 10-325 MG tablet Take 1 tablet by mouth 4 (four) times daily as needed for moderate pain or severe pain.   Yes [provider]  insulin NPH-regular Human (70-30) 100 UNIT/ML injection Inject 20-50 Units into the skin See admin instructions. Inject 50 units in the morning and 20 units in the evening   Yes [provider]  levocetirizine (XYZAL) 5 MG tablet Take 5 mg by mouth every evening.   Yes [provider]  metFORMIN (GLUCOPHAGE) 1000 MG  tablet Take 1,000 mg by mouth 2 (two) times daily with a meal.   Yes [provider]  metoprolol succinate (TOPROL-XL) 50 MG 24 hr tablet Take 50 mg by mouth daily. 04/07/22  Yes [provider]  rosuvastatin (CRESTOR) 20 MG tablet Take 20 mg by mouth daily. 04/07/22  Yes [provider]  tamsulosin (FLOMAX) 0.4 MG CAPS capsule Take 0.4 mg by mouth daily.   Yes [provider]  trandolapril (MAVIK) 2 MG tablet Take 2 mg by mouth daily.   Yes [provider]  zolpidem (AMBIEN) 10 MG tablet Take 20 mg by mouth at bedtime. 02/13/22  Yes [provider]  oxyCODONE-acetaminophen (PERCOCET) 5-325 MG tablet Take 1-2 tablets by mouth every 4 (four) hours as needed for severe pain. Patient not taking: Reported on 12/25/2022 05/23/22 05/23/23  Val Eagle D, NP    Inpatient Medications: Scheduled Meds:  azelastine  1 spray Each Nare BID   escitalopram  10 mg Oral Daily   insulin aspart  0-15 Units Subcutaneous  Q4H   loratadine  10 mg Oral Daily   pantoprazole  40 mg Oral BID   rosuvastatin  20 mg Oral Daily   sodium chloride flush  3 mL Intravenous Q12H   tamsulosin  0.4 mg Oral Daily   Continuous Infusions:  amiodarone 30 mg/hr (12/25/22 1200)   lactated ringers 75 mL/hr at 12/25/22 1055   methocarbamol (ROBAXIN) IV     vancomycin     PRN Meds: acetaminophen **OR** acetaminophen, hydrALAZINE, LORazepam, methocarbamol (ROBAXIN) IV, metoprolol tartrate, morphine injection, ondansetron **OR** ondansetron (ZOFRAN) IV, oxyCODONE  Allergies:   No Known Allergies  Social History:   Social History   Socioeconomic History   Marital status: Significant Other    Spouse name: Not on file   Number of children: 2   Years of education: Not on file   Highest education level: Not on file  Occupational History   Not on file  Tobacco Use   Smoking status: Former    Types: Cigarettes    Quit date: 1994    Years since quitting: 30.4    Passive exposure: Never   Smokeless tobacco: Never  Vaping Use   Vaping Use: Never used  Substance and Sexual Activity   Alcohol use: Not Currently   Drug use: Never   Sexual activity: Yes  Other Topics Concern   Not on file  Social History Narrative   Not on file   Social Determinants of Health   Financial Resource Strain: Not on file  Food Insecurity: Not on file  Transportation Needs: Not on file  Physical Activity: Not on file  Stress: Not on file  Social Connections: Not on file  Intimate Partner Violence: Not on file    Family History:   Family History  Problem Relation Age of Onset   Heart attack Mother    Heart attack Father    Hypertension Brother    Diabetes Brother    Coronary artery disease Brother      ROS:  Please see the history of present illness.  Difficult to get a full ROS due to patient's profound weakness and lethargy.  Physical Exam/Data:   Vitals:   12/25/22 0722 12/25/22 0800 12/25/22 1200 12/25/22 1216  BP:  120/70 134/66 130/85 130/85  Pulse:      Resp: (!) 32  19 (!) 23  Temp: 98.2 F (36.8 C)   98.6 F (37 C)  TempSrc: Oral   Oral  SpO2:  94%   96%    Intake/Output Summary (Last 24 hours) at 12/25/2022 1345 Last data filed at 12/25/2022 1200 Gross per 24 hour  Intake 1977.4 ml  Output 2450 ml  Net -472.6 ml      10/07/2022   10:36 AM 09/30/2022   11:04 AM 09/23/2022   10:38 AM  Last 3 Weights  Weight (lbs) 208 lb 207 lb 207 lb  Weight (kg) 94.348 kg 93.895 kg 93.895 kg     There is no height or weight on file to calculate BMI.  General: 72 y.o. ill appearing Caucasian male. HEENT: Normocephalic and atraumatic. Sclera clear.  Neck: Supple. No JVD. Heart: RRR. Distinct S1 and S2. No significant murmurs, gallops, or rubs. Radial and distal pedal pulses 2+ and equal bilaterally. Lungs: No increased work of breathing. Clear to ausculation bilaterally. No wheezes, rhonchi, or rales.  Abdomen: Soft, non-distended, and non-tender to palpation. Bowel sounds present. MSK: Generalized weakness. Extremities: No lower extremity edema.    Skin: Warm and dry. Neuro: Alert and oriented x3. No focal deficits. Psych: Normal affect. Responds appropriately.  EKG:  The EKG was personally reviewed and demonstrates:  EKG on 12/24/2022 showed normal sinus rhythm, rate 88 bpm, with known RBBB.  EKG on 12/25/2022 showed atrial fibrillation, rate 152 beats minute, with RBBB some mild ST depression (likely rate related).  Telemetry:  Telemetry was personally reviewed and demonstrates:  Paroxysmal atrial fibrillation. Rates as high as the 140s to 160s when in atrial fibrillation. Currently in normal sinus rhythm with rates in the 80s to 9s.  Relevant CV Studies:  Myoview 08/28/2022: Impressions:   Patient exercised for 6 minutes and 6 seconds, according to Bruce protocol, achieving 7.0 METS. HR increased to 133 bpm which is 88% of maximum predicted HR. Patient has average exercise capacity.   No chest pain  during the test   Stress ECG is negative for ischemia.   LV perfusion is normal. There is no evidence of current ischemia or prior infarction.   Left ventricular function is normal. Nuclear stress EF: 65 %.   The study is normal. The study is low risk.   Laboratory Data:  High Sensitivity Troponin:   Recent Labs  Lab 12/24/22 1646 12/24/22 1759  TROPONINIHS 227* 181*     Chemistry Recent Labs  Lab 12/24/22 1646 12/25/22 0101  NA 139 136  K 3.8 3.5  CL 104 102  CO2 17* 18*  GLUCOSE 218* 213*  BUN 54* 56*  CREATININE 1.23 1.28*  CALCIUM 8.4* 8.2*  GFRNONAA >60 >60  ANIONGAP 18* 16*    Recent Labs  Lab 12/24/22 1646 12/25/22 0101  PROT 5.6* 5.7*  ALBUMIN 1.9* 1.8*  AST 92* 110*  ALT 34 41  ALKPHOS 70 76  BILITOT 1.9* 1.8*   Lipids No results for input(s): "CHOL", "TRIG", "HDL", "LABVLDL", "LDLCALC", "CHOLHDL" in the last 168 hours.  Hematology Recent Labs  Lab 12/24/22 1646 12/25/22 0101  WBC 12.1* 12.1*  RBC 5.03 4.74  HGB 12.6* 12.3*  HCT 39.4 37.9*  MCV 78.3* 80.0  MCH 25.0* 25.9*  MCHC 32.0 32.5  RDW 15.4 15.3  PLT PLATELET CLUMPS NOTED ON SMEAR, UNABLE TO ESTIMATE PLATELET CLUMPS NOTED ON SMEAR, UNABLE TO ESTIMATE   Thyroid  Recent Labs  Lab 12/24/22 1759  TSH 0.457    BNPNo results for input(s): "BNP", "PROBNP" in the last 168 hours.  DDimer No results for input(s): "DDIMER" in the last 168 hours.   Radiology/Studies:  MR Lumbar  Spine W Wo Contrast  Addendum Date: 12/25/2022   ADDENDUM REPORT: 12/25/2022 11:53 ADDENDUM: This unusual case was discussed by telephone with Dr. Delma Officer, Neurosurgery on 12/25/2022 at 1139 hours. Electronically Signed   By: Odessa Fleming M.D.   On: 12/25/2022 11:53   Result Date: 12/25/2022 CLINICAL DATA:  72 year old male with back pain. New onset atrial fibrillation. Abnormal prevertebral soft tissue gas and inflammation on recent [facility] lumbar spine CT 12/23/2022. EXAM: MRI LUMBAR SPINE WITHOUT  AND WITH CONTRAST TECHNIQUE: Multiplanar and multiecho pulse sequences of the lumbar spine were obtained without and with intravenous contrast. CONTRAST:  9mL GADAVIST GADOBUTROL 1 MMOL/ML IV SOLN COMPARISON:  Lumbar spine CT 12/23/2022 and earlier. FINDINGS: Segmentation:  Normal on the recent CT. Alignment: Stable, and with maintained vertebral body height at this time. Vertebrae: Marrow signal heterogeneity at both the L4 and L5 vertebral bodies on precontrast imaging, with patchy T2 and STIR hyperintensity at both levels. But striking lack of enhancement of both vertebral bodies following contrast (series 8, image 11). The posterior elements of both levels are less affected although the hypoenhancement continues into the left L5 pedicle, and similar hypoenhancement of the right L4 pedicle. Small there are patchy areas of more preserved enhancement along the periphery of both vertebral bodies. No similar loss of bone enhancement elsewhere in the visible spine or sacrum. No other areas of marrow edema. Conus medullaris and cauda equina: Conus extends to the T12-L1 level. No lower spinal cord or conus signal abnormality. No abnormal intradural enhancement is identified. No definite dural thickening. Paraspinal and other soft tissues: Epidural and paravertebral soft tissues are abnormal at both L4 and L5, see below. Maintained visible major vascular flow voids in the abdomen. Grossly negative visible abdominal viscera. Diverticulosis of the large bowel at the pelvic inlet. Partially visible distended urinary bladder. Disc levels: Largely normal for age MRI appearance of the spine from T12 through L3. L4 and L5: Heterogeneous and thickened appearance of the ventral epidural spaces at both levels, seemingly with absent enhancement of the epidural venous plexus there. Mild to moderate surrounding paravertebral soft tissue inflammation including some muscle edema in the medial psoas and erector spinae muscles. Abnormal  fluid signal in the L4-L5 disc space. But no paraspinal fluid collection. No organized or rim enhancing fluid. Multifactorial spinal stenosis which appears in large part due to the heterogeneously expanded ventral epidural space at both levels. No significant lumbar spinal stenosis here on the 2021 MRI despite chronic disc and endplate degeneration at those levels. IMPRESSION: 1. 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 No vertebral body collapse at this time. Posterior elements relatively spared (although right L4 and left L5 pedicles are affected). Abnormal ventral epidural space at both levels which may be in part related to basivertebral venous thrombosis. 2. Subsequent surrounding soft tissue inflammation, abnormal signal in the intervening L4-L5 disc space, and multifactorial spinal stenosis at both levels. But the striking loss of vertebral body enhancement is consistent with osteonecrosis rather than osteomyelitis. 3. Other lumbar and sacral levels appear unaffected. And visible lower thoracic spinal cord, conus medullaris, and cauda equina nerve roots above L4 seem to remain normal. Electronically Signed: By: Odessa Fleming M.D. On: 12/25/2022 11:04     Assessment and Plan:   New Onset Atrial Fibrillation with RVR Patient was admitted for sepsis  secondary to possible osteomyelitis/discitis (now felt to be more osteonecrosis based on MRI results) and was found to have new onset atrial fibrillation with RVR.   He was initially started on IV Cardizem out by the ED without improvement and therefore was started on IV Amiodarone and converted to sinus rhythm. IV Amiodarone was stopped and he had recurrent rapid atrial fibrillation so this was restarted. - Going in and out of atrial fibrillation on telemetry. Currently in normal sinus  rhythm with rates in the 80s to 90s but rates as high as the 140s to 160s when in atrial fibrillation. - Potassium mildly low at 3.1 on admission. Repleted and 3.5 today. Goal >4.0. Being repleted by primary team. - Will check Magnesium for thoroughness. - Echo pending. - Continue IV Amiodarone. - CHA2DS2-VASc = 4 (coronary calcifications on CT, HTN, DM, age). However, patient also has a history of spontaneous subarachnoid intracranial hemorrhage in 2018. Platelets were also low at 59,000 at [facility] on 12/22/2022. Platelets were unable to be calculated on CBC today due to platelet clumps. Will try to reach out to Neurosurgery for their thoughts on starting anticoagulation. Will also need to make sure platelets have stabilized.  Elevated Troponin High-sensitivity troponin mildly elevated and peaked at 227. EKG showed mild ST depressions when in rapid atrial fibrillation but no acute ischemic changes when in normal sinus rhythm. - No chest pain.  - Echo pending. - Suspect troponin elevation is secondary to demand ischemia in setting of sepsis with suspected osteomyelitis/ discitis and rapid atrial fibrillation. Will wait for Echo results but do not anticipate any ischemic evaluation this admission given acute infection.  Dyspnea Patient has had dyspnea on exertion since 04/2022 that came on suddenly (was able to walk 12-13 miles prior to that with no issues). Outpatient cardiac work-up with Myoview and Echo were unremarkable. He was found to have abnormal PFTs and referred to Pulmonology. Chest CTA on admission negative for PE. - Euvolemic on exam.  - Don't think dyspnea is cardiac in nature. Follow-up with Pulmonology as an outpatient.  Hypertension History of hypertension but has had more issues with hypotension.  - Home Lopressor and HCTZ on hold. Continue to hold.  Bacteremia Blood cultures at [facility] positive for Staph aureus. - ID is following and recommends TEE to rule out  endocarditis. However, patient is extremely frail right now. Will discuss with MD about timing of this.  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 The patient's score is based upon: CHF History: 0 HTN History: 1 Diabetes History: 1 Stroke History: 0 Vascular Disease History: 1 Age Score: 1 Gender Score: 0     For questions or updates, please contact [facility] Please consult [email or web address] for contact info under    Signed, Corrin Parker, PA-C  12/25/2022 1:45 PM

## 2022-12-25 NOTE — Progress Notes (Signed)
Providing Compassionate, Quality Care - Together   Subjective: Patient reports severe back and BLE pain.He feels like his legs are "on fire," but tells me he has a history of diabetic neuropathy.  Objective: Vital signs in last 24 hours: Temp:  [97.6 F (36.4 C)-98.7 F (37.1 C)] 98.2 F (36.8 C) (05/29 0722) Pulse Rate:  [84-97] 97 (05/28 2300) Resp:  [20-32] 32 (05/29 0722) BP: (90-120)/(47-70) 120/70 (05/29 0722) SpO2:  [94 %-95 %] 94 % (05/29 0722)  Intake/Output from previous day: 05/28 0701 - 05/29 0700 In: 1763.6 [P.O.:1200; I.V.:11.1; IV Piggyback:552.5] Out: 2450 [Urine:2450] Intake/Output this shift: No intake/output data recorded.  Alert, oriented PERRLA 2 brisk Tachypneic Condom catheter, voiding without injury MAE, S/S grossly intact BLE hyperpathic  Lab Results: Recent Labs    12/24/22 1646 12/25/22 0101  WBC 12.1* 12.1*  HGB 12.6* 12.3*  HCT 39.4 37.9*  PLT PLATELET CLUMPS NOTED ON SMEAR, UNABLE TO ESTIMATE PLATELET CLUMPS NOTED ON SMEAR, UNABLE TO ESTIMATE   BMET Recent Labs    12/24/22 1646 12/25/22 0101  NA 139 136  K 3.8 3.5  CL 104 102  CO2 17* 18*  GLUCOSE 218* 213*  BUN 54* 56*  CREATININE 1.23 1.28*  CALCIUM 8.4* 8.2*    Studies/Results: MR Lumbar Spine W Wo Contrast  Result Date: 12/25/2022 CLINICAL DATA:  72 year old male with back pain. New onset atrial fibrillation. Abnormal prevertebral soft tissue gas and inflammation on recent [facility] lumbar spine CT 12/23/2022. EXAM: MRI LUMBAR SPINE WITHOUT AND WITH CONTRAST TECHNIQUE: Multiplanar and multiecho pulse sequences of the lumbar spine were obtained without and with intravenous contrast. CONTRAST:  9mL GADAVIST GADOBUTROL 1 MMOL/ML IV SOLN COMPARISON:  Lumbar spine CT 12/23/2022 and earlier. FINDINGS: Segmentation:  Normal on the recent CT. Alignment: Stable, and with maintained vertebral body height at this time. Vertebrae: Marrow signal heterogeneity at both  the L4 and L5 vertebral bodies on precontrast imaging, with patchy T2 and STIR hyperintensity at both levels. But striking lack of enhancement of both vertebral bodies following contrast (series 8, image 11). The posterior elements of both levels are less affected although the hypoenhancement continues into the left L5 pedicle, and similar hypoenhancement of the right L4 pedicle. Small there are patchy areas of more preserved enhancement along the periphery of both vertebral bodies. No similar loss of bone enhancement elsewhere in the visible spine or sacrum. No other areas of marrow edema. Conus medullaris and cauda equina: Conus extends to the T12-L1 level. No lower spinal cord or conus signal abnormality. No abnormal intradural enhancement is identified. No definite dural thickening. Paraspinal and other soft tissues: Epidural and paravertebral soft tissues are abnormal at both L4 and L5, see below. Maintained visible major vascular flow voids in the abdomen. Grossly negative visible abdominal viscera. Diverticulosis of the large bowel at the pelvic inlet. Partially visible distended urinary bladder. Disc levels: Largely normal for age MRI appearance of the spine from T12 through L3. L4 and L5: Heterogeneous and thickened appearance of the ventral epidural spaces at both levels, seemingly with absent enhancement of the epidural venous plexus there. Mild to moderate surrounding paravertebral soft tissue inflammation including some muscle edema in the medial psoas and erector spinae muscles. Abnormal fluid signal in the L4-L5 disc space. But no paraspinal fluid collection. No organized or rim enhancing fluid. Multifactorial spinal stenosis which appears in large part due to the heterogeneously expanded ventral epidural space at both levels. No significant lumbar spinal stenosis here on the  2021 MRI despite chronic disc and endplate degeneration at those levels. IMPRESSION: 1. 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 No vertebral body collapse at this time. Posterior elements relatively spared (although right L4 and left L5 pedicles are affected). Abnormal ventral epidural space at both levels which may be in part related to basivertebral venous thrombosis. 2. Subsequent surrounding soft tissue inflammation, abnormal signal in the intervening L4-L5 disc space, and multifactorial spinal stenosis at both levels. But the striking loss of vertebral body enhancement is consistent with osteonecrosis rather than osteomyelitis. 3. Other lumbar and sacral levels appear unaffected. And visible lower thoracic spinal cord, conus medullaris, and cauda equina nerve roots above L4 seem to remain normal. Electronically Signed   By: Odessa Fleming M.D.   On: 12/25/2022 11:04    Assessment/Plan: Patient with significant back pain with radiation down BLE. The patient is septic with elevated troponin and a-fib with RVR. Patient's lumbar MRI concerning for osteonecrosis of the L4 and L5 vertebral bodies. No prior surgery of the lumbar spine, but does have history of C3-4, C4-5 ACDF with Dr. Lovell Sheehan on 05/22/2022. MRI with evidence of infarction of the L4 and L5 vertebral bodies. This is not consistent with osteomyelitis of the lumbar spine. There is no role for surgery for this issue. Gabapentin can be considered for his neuropathy if primary team feels it is appropriate.    LOS: 1 day      Val Eagle, DNP, AGNP-C Nurse Practitioner  [facility] [address], [place], [place] [address] P: [phone]    F: [phone]  12/25/2022, 11:25 AM

## 2022-12-25 NOTE — Plan of Care (Signed)
°  Problem: Clinical Measurements: °Goal: Respiratory complications will improve °Outcome: Progressing °Goal: Cardiovascular complication will be avoided °Outcome: Progressing °  °Problem: Activity: °Goal: Risk for activity intolerance will decrease °Outcome: Progressing °  °Problem: Coping: °Goal: Level of anxiety will decrease °Outcome: Progressing °  °Problem: Elimination: °Goal: Will not experience complications related to urinary retention °Outcome: Progressing °  °Problem: Pain Managment: °Goal: General experience of comfort will improve °Outcome: Progressing °  °Problem: Safety: °Goal: Ability to remain free from injury will improve °Outcome: Progressing °  °

## 2022-12-25 NOTE — Hospital Course (Addendum)
34 to male with h/o HTN and DM who presented on 5/28 with back pain.   He initially presented to [facility] on 5/26 with c/o chronic back pain with radiation down both legs and SOB x 1 week.  Underwent extensive evaluation including blood cultures, CT chest abdomen pelvis with contrast, CT head, CT lumbar spine and was found to have concern for discitis/osteomyelitis of his lumbar spine.  CT scan negative for PE.  Transferred to [facility] for neurosurgery evaluation.  Found to have Staph aureus bacteremia with osteonecrosis of the lumbar spine.  Also had A-fib with RVR.  Neurosurgery, ID, cardiology following. Surveillance cultures have been negative.  ID recommends IV Nafcillin through 6/14 and then IV Cefazolin 2 grams IV q8h x 6 more weeks and then long-term Cefadroxil 1000 mg BID (10+ months).  He has a guarded prognosis and may need palliative care involvement.  Family meeting is planned for Sunday, 6/16.

## 2022-12-25 NOTE — Progress Notes (Signed)
[facility] Progress Note Patient: Jim Ramirez ZOX:[id number] DOB: 03-13-1951 DOA: 12/24/2022  DOS: the patient was seen and examined on 12/25/2022  Brief hospital course: PMH of HTN and DM presenting with back pain.   He initially presented to [facility] on 5/26 with c/o chronic back pain with radiation down both legs.  He also noted SOB x 1 week.  Found to have Staph aureus bacteremia with osteonecrosis of the lumbar spine.  Also had A-fib with RVR.  Neurosurgery, ID, cardiology following. Assessment and Plan: Sepsis secondary to Staph aureus infection and bacteremia, osteomyelitis and lumbar spine discitis. Present on admission. Myositis criteria with leukocytosis tachycardia and tachypnea. Repeat blood cultures ordered. ID following. Currently on vancomycin. Echocardiogram ordered. Cardiology recommended the patient is not a good candidate for TEE.Marland Kitchen Neurosurgery was also consulted.  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 Neurosurgery consulted. The patient has osteomyelitis of the L-spine. Monitor. Neurosurgery Recommend gabapentin.  BPH. Continue Flomax.  Type 2 diabetes mellitus, uncontrolled with hyperglycemia with long-term insulin use. Currently uncooperative for diet. Hemoglobin A1c 8.1. Monitor.  New onset A-fib. Possible CHF. Cardiology consulted. Echocardiogram ordered Amiodarone drip. Not a good candidate  for anticoagulation.  Patient was on airborne isolation for concern for TB due to vertebral osteomyelitis.  Low suspicion.  Isolation discontinue per ID.  HLD. On statin.  Mood disorder. On Lexapro.  Right wrist edema. No fall reported. Will get x-ray.  May require MRI.   Subjective: Continues to have back pain.  No nausea no vomiting no fever no chills.  Continues to have shortness of breath.  No chest pain.  Physical Exam: General: in Mild distress, No Rash Cardiovascular: S1 and S2 Present, No Murmur Respiratory: Increased respiratory  effort, Bilateral Air entry present.  Basal crackles, No wheezes Abdomen: Bowel Sound present, No tenderness Extremities: No edema, right upper extremity edema. Neuro: Alert and oriented x3, no new focal deficit  Data Reviewed: I have Reviewed nursing notes, Vitals, and Lab results. Since last encounter, pertinent lab results CBC and BMP   . I have ordered test including CBC BMP magnesium  . I have discussed pt's care plan and test results with ID and cardiology  . I have ordered imaging x-ray of wrist  .   Disposition: Status is: Inpatient Remains inpatient appropriate because: Need for IV antibiotics and further workup  SCDs Start: 12/24/22 1740   Family Communication: No one at bedside Level of care: Progressive   Vitals:   12/25/22 0800 12/25/22 1200 12/25/22 1216 12/25/22 1700  BP: 134/66 130/85 130/85 124/64  Pulse:      Resp:  19 (!) 23 17  Temp:   98.6 F (37 C) 98.7 F (37.1 C)  TempSrc:   Oral Oral  SpO2:   96% 97%     Author: Lynden Oxford, MD 12/25/2022 5:30 PM  Please look on [email or web address] to find out who is on call.

## 2022-12-25 NOTE — Consult Note (Addendum)
[facility]    Date of Admission:  12/24/2022     Reason for Consult: Concern for discitis/osteomyelitis     Referring Physician: Dr. Ophelia Charter  Current antibiotics: Vancomycin Cefepime Metronidazole   ASSESSMENT:    72 y.o. male admitted with:  Suspected lumbar discitis/osteomyelitis: Patient presenting with back pain and CT without contrast at outside hospital read as concerning for lumbar discitis/osteomyelitis.  His inflammatory markers are also elevated which could be consistent with infectious vertebral disease and he reports several months of low grade fevers and chills.  Blood cultures at this time are negative and MRI for further evaluation is pending. New onset atrial fibrillation currently being managed with amiodarone infusion. Type 2 diabetes: Previous A1c was 8.1 with repeat measurement pending. BPH: Home medications include Tamsulosin Elevated LFTs: Likely due to infection.  CT abd/pelvis at [facility] without acute issues. Sepsis physiology: Likely due to#1 and #2. AKI: Creatinine 1.28 today compared to 0.71 several months ago.   RECOMMENDATIONS:    Patient pending MRI lumbar spine for further workup.  Typically, I think it would be reasonable to hold antibiotics pending any possible surgical intervention versus IR guided aspiration to increase the microbiologic yield, however, given that he clinically does not appear well, will continue broad spectrum antibiotics in the interim Will reach out to [facility] re: any blood culture results Continue Vancomycin Stop Cefepime, Flagyl.  Switch to Ceftriaxone Echo Check hepatitis serologies Low suspicion for TB and no pulmonary disease on imaging.  Will stop airborne precautions Following   ADDENDUM 9:33 AM:  We spoke to [facility].  He has 2 of 2 blood cultures positive for Staph aureus from 5/27.  Susceptibilities pending.  Repeat cultures here pending. Will stop ceftriaxone and continue  vancomcyin Needs TEE given duration of symptoms.  Would not be surprised if he has endocarditis and epidural abscess. Hopefully MRI can be expedited.    Principal Problem:   Discitis of lumbar region Active Problems:   Diabetes mellitus type 2 in nonobese [facility])   Dyslipidemia   Essential hypertension   New onset atrial fibrillation (HCC)   Sepsis (HCC)   BPH (benign prostatic hyperplasia)   Mood disorder (HCC)   MEDICATIONS:    Scheduled Meds:  ALPRAZolam  0.5 mg Oral Once   azelastine  1 spray Each Nare BID   escitalopram  10 mg Oral Daily   insulin aspart  0-15 Units Subcutaneous Q4H   loratadine  10 mg Oral Daily   pantoprazole  40 mg Oral BID   potassium chloride  40 mEq Oral Q4H   rosuvastatin  20 mg Oral Daily   sodium chloride flush  3 mL Intravenous Q12H   tamsulosin  0.4 mg Oral Daily   Continuous Infusions:  amiodarone 60 mg/hr (12/25/22 0339)   Followed by   amiodarone     ceFEPime (MAXIPIME) IV 2 g (12/25/22 0402)   lactated ringers     methocarbamol (ROBAXIN) IV     metronidazole 500 mg (12/24/22 1945)   vancomycin 1,750 mg (12/24/22 2144)   PRN Meds:.acetaminophen **OR** acetaminophen, hydrALAZINE, LORazepam, methocarbamol (ROBAXIN) IV, metoprolol tartrate, morphine injection, ondansetron **OR** ondansetron (ZOFRAN) IV, oxyCODONE  HPI:    Jim Ramirez is a 72 y.o. male with past medical history significant for hypertension, diabetes who presented initially to [facility] 12/22/2022 with back pain radiating down both legs.  He was prescribed Lidoderm patches at that ER visit but returned the next day with ongoing back  pain, tachycardia, shortness of breath.  He was started on IV fluids and antibiotics due to concern for sepsis.  He had a lumbar CT that showed "possible osteomyelitis with gas-forming organism" and MRI was recommended.  He subsequently developed atrial fibrillation with RVR and was transferred to [facility].  He has no prior history of  lumbar back surgery but has had prior neck surgeries with Dr. Lovell Sheehan.  Upon admission here he was afebrile with a leukocytosis of 12.1.  He has been in atrial fibrillation with RVR overnight and was started back on amiodarone infusion.  Antibiotics were continued by the admitting service.  It appears blood cultures have been drawn at [facility] per the notes. Neurosurgery has seen the patient and is awaiting MRI for further delineation of his lumbar disease.  However, they do not note any significant phlegmon or compressive abscess.  His lab work also shows blood cultures that are no growth to date and inflammatory markers that are elevated.  He reports approximately 6 months of back pain and low grade nightly fevers and chills.  He also reports decreased exercise capacity.  He reports having an MRI as an outpatient about 2 months ago that was unremarkable.    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   CTA negative for aneurysm   Depressed    Diabetes mellitus without complication (HCC)    DVT (deep venous thrombosis) (HCC)    History of kidney stones    Hypertension    Nephrolithiasis    Sleep apnea     Social History   Tobacco Use   Smoking status: Former    Types: Cigarettes    Quit date: 1994    Years since quitting: 30.4    Passive exposure: Never   Smokeless tobacco: Never  Vaping Use   Vaping Use: Never used  Substance Use Topics   Alcohol use: Not Currently   Drug use: Never    Family History  Problem Relation Age of Onset   Heart attack Mother    Heart attack Father    Hypertension Brother    Diabetes Brother    Coronary artery disease Brother     No Known Allergies  Review of Systems  All other systems reviewed and are negative.  Except as noted above.   OBJECTIVE:   Blood pressure 120/70, pulse 97, temperature 98.2 F (36.8 C), temperature source Oral, resp. rate (!) 32, SpO2 94  %. There is no height or weight on file to calculate BMI.  Physical Exam Constitutional:      General: He is not in acute distress.    Appearance: He is ill-appearing.  HENT:     Head: Normocephalic and atraumatic.  Eyes:     Extraocular Movements: Extraocular movements intact.     Conjunctiva/sclera: Conjunctivae normal.  Cardiovascular:     Rate and Rhythm: Normal rate.     Heart sounds: No murmur heard. Pulmonary:     Effort: Pulmonary effort is normal. No respiratory distress.     Breath sounds: Normal breath sounds.  Abdominal:     General: There is no distension.     Palpations: Abdomen is soft.  Musculoskeletal:     Cervical back: Normal range of motion and neck supple.     Right lower leg: No edema.     Left lower leg: No edema.     Comments: No extensive lower extremity weakness noted.  Possibly slightly weaker on  the right.   Skin:    General: Skin is warm and dry.     Findings: No rash.  Neurological:     General: No focal deficit present.     Mental Status: He is alert and oriented to person, place, and time.  Psychiatric:        Mood and Affect: Mood normal.        Behavior: Behavior normal.      Lab Results: Lab Results  Component Value Date   WBC 12.1 (H) 12/25/2022   HGB 12.3 (L) 12/25/2022   HCT 37.9 (L) 12/25/2022   MCV 80.0 12/25/2022   PLT PLATELET CLUMPS NOTED ON SMEAR, UNABLE TO ESTIMATE 12/25/2022    Lab Results  Component Value Date   NA 136 12/25/2022   K 3.5 12/25/2022   CO2 18 (L) 12/25/2022   GLUCOSE 213 (H) 12/25/2022   BUN 56 (H) 12/25/2022   CREATININE 1.28 (H) 12/25/2022   CALCIUM 8.2 (L) 12/25/2022   GFRNONAA >60 12/25/2022   GFRAA  05/09/2010    >60        The eGFR has been calculated using the MDRD equation. This calculation has not been validated in all clinical situations. eGFR's persistently <60 mL/min signify possible Chronic Kidney Disease.    Lab Results  Component Value Date   ALT 41 12/25/2022   AST 110  (H) 12/25/2022   ALKPHOS 76 12/25/2022   BILITOT 1.8 (H) 12/25/2022       Component Value Date/Time   CRP 48.2 (H) 12/24/2022 1646       Component Value Date/Time   ESRSEDRATE 43 (H) 12/24/2022 1646    I have reviewed the micro and lab results in Epic.  Imaging: No results found.   Imaging independently reviewed in Epic.  Vedia Coffer for Infectious Disease [facility] [phone] pager 12/25/2022, 8:15 AM

## 2022-12-26 ENCOUNTER — Other Ambulatory Visit: Payer: Self-pay

## 2022-12-26 ENCOUNTER — Encounter (HOSPITAL_COMMUNITY)
Disposition: A | Discharge status: Skilled Nursing Facility | Payer: Self-pay | Source: Other Acute Inpatient Hospital | Attending: Internal Medicine

## 2022-12-26 ENCOUNTER — Inpatient Hospital Stay (HOSPITAL_COMMUNITY): Payer: Medicare HMO

## 2022-12-26 ENCOUNTER — Encounter (HOSPITAL_COMMUNITY): Payer: Self-pay | Admitting: Internal Medicine

## 2022-12-26 DIAGNOSIS — Z794 Long term (current) use of insulin: Secondary | ICD-10-CM | POA: Diagnosis not present

## 2022-12-26 DIAGNOSIS — R7881 Bacteremia: Secondary | ICD-10-CM | POA: Diagnosis not present

## 2022-12-26 DIAGNOSIS — M4646 Discitis, unspecified, lumbar region: Secondary | ICD-10-CM | POA: Diagnosis not present

## 2022-12-26 DIAGNOSIS — Z7189 Other specified counseling: Secondary | ICD-10-CM

## 2022-12-26 DIAGNOSIS — B9561 Methicillin susceptible Staphylococcus aureus infection as the cause of diseases classified elsewhere: Secondary | ICD-10-CM

## 2022-12-26 DIAGNOSIS — I4891 Unspecified atrial fibrillation: Secondary | ICD-10-CM | POA: Diagnosis not present

## 2022-12-26 DIAGNOSIS — Z515 Encounter for palliative care: Secondary | ICD-10-CM

## 2022-12-26 DIAGNOSIS — E119 Type 2 diabetes mellitus without complications: Secondary | ICD-10-CM | POA: Diagnosis not present

## 2022-12-26 LAB — BASIC METABOLIC PANEL
Anion gap: 13 (ref 5–15)
BUN: 50 mg/dL — ABNORMAL HIGH (ref 8–23)
CO2: 21 mmol/L — ABNORMAL LOW (ref 22–32)
Calcium: 8.1 mg/dL — ABNORMAL LOW (ref 8.9–10.3)
Chloride: 103 mmol/L (ref 98–111)
Creatinine, Ser: 1.06 mg/dL (ref 0.61–1.24)
GFR, Estimated: >60 mL/min (ref >60)
Glucose, Bld: 190 mg/dL — ABNORMAL HIGH (ref 70–99)
Potassium: 3.6 mmol/L (ref 3.5–5.1)
Sodium: 137 mmol/L (ref 135–145)

## 2022-12-26 LAB — GLUCOSE, CAPILLARY
Glucose-Capillary: 161 mg/dL — ABNORMAL HIGH (ref 70–99)
Glucose-Capillary: 206 mg/dL — ABNORMAL HIGH (ref 70–99)
Glucose-Capillary: 174 mg/dL — ABNORMAL HIGH (ref 70–99)
Glucose-Capillary: 180 mg/dL — ABNORMAL HIGH (ref 70–99)
Glucose-Capillary: 164 mg/dL — ABNORMAL HIGH (ref 70–99)

## 2022-12-26 LAB — CBC
HCT: 34.8 % — ABNORMAL LOW (ref 39.0–52.0)
Hemoglobin: 11.4 g/dL — ABNORMAL LOW (ref 13.0–17.0)
MCH: 25.2 pg — ABNORMAL LOW (ref 26.0–34.0)
MCHC: 32.8 g/dL (ref 30.0–36.0)
MCV: 76.8 fL — ABNORMAL LOW (ref 80.0–100.0)
Platelets: 63 10*3/uL — ABNORMAL LOW (ref 150–400)
RBC: 4.53 MIL/uL (ref 4.22–5.81)
RDW: 15.3 % (ref 11.5–15.5)
WBC: 15.4 10*3/uL — ABNORMAL HIGH (ref 4.0–10.5)
nRBC: 0 % (ref 0.0–0.2)

## 2022-12-26 LAB — HEMOGLOBIN A1C
Hgb A1c MFr Bld: 8.5 % — ABNORMAL HIGH (ref 4.8–5.6)
Mean Plasma Glucose: 197 mg/dL

## 2022-12-26 LAB — CULTURE, BLOOD (ROUTINE X 2)

## 2022-12-26 LAB — MAGNESIUM: Magnesium: 2.8 mg/dL — ABNORMAL HIGH (ref 1.7–2.4)

## 2022-12-26 SURGERY — ECHOCARDIOGRAM, TRANSESOPHAGEAL
Anesthesia: Monitor Anesthesia Care

## 2022-12-26 MED ORDER — LIDOCAINE 5 % EX PTCH
2.0000 | MEDICATED_PATCH | CUTANEOUS | Status: DC
  Administered 2022-12-26 – 2023-01-28 (×29): 2 via TRANSDERMAL
  Filled 2022-12-26 (×32): qty 2

## 2022-12-26 MED ORDER — CEFAZOLIN SODIUM-DEXTROSE 2-4 GM/100ML-% IV SOLN
2.0000 g | Freq: Three times a day (TID) | INTRAVENOUS | Status: DC
  Administered 2022-12-26 – 2022-12-28 (×6): 2 g via INTRAVENOUS
  Filled 2022-12-26 (×6): qty 100

## 2022-12-26 MED ORDER — GABAPENTIN 100 MG PO CAPS
100.0000 mg | ORAL_CAPSULE | Freq: Two times a day (BID) | ORAL | Status: DC
  Administered 2022-12-26 – 2023-01-28 (×67): 100 mg via ORAL
  Filled 2022-12-26 (×67): qty 1

## 2022-12-26 MED ORDER — INSULIN ASPART 100 UNIT/ML IJ SOLN
0.0000 [IU] | Freq: Every day | INTRAMUSCULAR | Status: DC
  Administered 2022-12-28: 3 [IU] via SUBCUTANEOUS
  Administered 2022-12-29 – 2022-12-30 (×2): 2 [IU] via SUBCUTANEOUS
  Administered 2023-01-01: 3 [IU] via SUBCUTANEOUS

## 2022-12-26 MED ORDER — METHOCARBAMOL 500 MG PO TABS
500.0000 mg | ORAL_TABLET | Freq: Three times a day (TID) | ORAL | Status: DC | PRN
  Filled 2022-12-26 (×2): qty 1

## 2022-12-26 MED ORDER — ACETAMINOPHEN 500 MG PO TABS
500.0000 mg | ORAL_TABLET | Freq: Three times a day (TID) | ORAL | Status: DC
  Administered 2022-12-26 – 2023-01-20 (×77): 500 mg via ORAL
  Filled 2022-12-26 (×77): qty 1

## 2022-12-26 MED ORDER — INSULIN ASPART 100 UNIT/ML IJ SOLN
0.0000 [IU] | Freq: Three times a day (TID) | INTRAMUSCULAR | Status: DC
  Administered 2022-12-26 (×2): 3 [IU] via SUBCUTANEOUS
  Administered 2022-12-27: 2 [IU] via SUBCUTANEOUS
  Administered 2022-12-27: 5 [IU] via SUBCUTANEOUS
  Administered 2022-12-27 – 2022-12-28 (×2): 3 [IU] via SUBCUTANEOUS
  Administered 2022-12-28: 5 [IU] via SUBCUTANEOUS
  Administered 2022-12-28: 15 [IU] via SUBCUTANEOUS
  Administered 2022-12-29 (×2): 5 [IU] via SUBCUTANEOUS
  Administered 2022-12-29 – 2022-12-30 (×3): 3 [IU] via SUBCUTANEOUS
  Administered 2022-12-30 – 2022-12-31 (×2): 8 [IU] via SUBCUTANEOUS
  Administered 2022-12-31: 3 [IU] via SUBCUTANEOUS
  Administered 2022-12-31: 5 [IU] via SUBCUTANEOUS
  Administered 2023-01-01: 3 [IU] via SUBCUTANEOUS

## 2022-12-26 NOTE — Inpatient Diabetes Management (Signed)
Inpatient Diabetes Program Recommendations  AACE/ADA: New Consensus Statement on Inpatient Glycemic Control (2015)  Target Ranges:  Prepandial:   less than 140 mg/dL      Peak postprandial:   less than 180 mg/dL (1-2 hours)      Critically ill patients:  140 - 180 mg/dL   Lab Results  Component Value Date   GLUCAP 206 (H) 12/26/2022   HGBA1C 8.5 (H) 12/24/2022    Review of Glycemic Control  Latest Reference Range & Units 12/25/22 19:21 12/25/22 22:58 12/26/22 03:06 12/26/22 07:37  Glucose-Capillary 70 - 99 mg/dL 161 (H) 096 (H) 045 (H) 206 (H)  (H): Data is abnormally high Diabetes history: Type 2 DM Outpatient Diabetes medications: Metformin 1000 mg BID, Jardiance 25 mg QD Current orders for Inpatient glycemic control: Novolog 0-15 units TID & HS  Inpatient Diabetes Program Recommendations:    Consider adding Semglee 8 units QD.  Thanks, Lujean Rave, MSN, RNC-OB Diabetes Coordinator [phone] (8a-5p)

## 2022-12-26 NOTE — Progress Notes (Signed)
Orthopedic Tech Progress Note Patient Details:  Jim Ramirez [birth year] [id number]  Ortho Devices Type of Ortho Device: Lumbar corsett Ortho Device/Splint Location: Back Ortho Device/Splint Interventions: Ordered      Al Decant 12/26/2022, 11:21 PM

## 2022-12-26 NOTE — Progress Notes (Signed)
[facility]  Date of Admission:  12/24/2022           Reason for visit: Follow up on Staph aureus bacteremia  Current antibiotics: Vancomycin    ASSESSMENT:    72 y.o. male admitted with:  MSSA bacteremia: Patient initially presenting to [facility] with several months of low back pain with CT imaging raising concern for possible discitis/osteomyelitis.  Follow-up MRI with and without contrast on 5/29 has revealed concern for L4 and L5 vertebral osteonecrosis with infarction of the L4 and L5 vertebral bodies rather than osteomyelitis/infection.  Initial blood cultures from OSH on 5/27 positive for MSSA.  Repeat blood cultures upon transfer to [facility] no growth to date from 5/28.  TTE done 5/29 negative for obvious vegetation, however, was a technically difficult study. L4 and L5 vertebral osteonecrosis: MRI 5/29 noted this finding with infarction of the L4 and L5 vertebral bodies.  Radiology felt that the findings were more consistent with osteonecrosis rather than osteomyelitis.  Unclear etiology.  Neurosurgery notes no surgery for this issue and feels that this is more likely than infection.  Ultimately, difficult to tell.  Certainly concerning for infection in setting of Staph bacteremia, but there is no compressive phlegmon or abscess requiring surgery right now.  He may need stabilization in the future but would need to improve and treat bacteremia prior to embarking upon this intervention. Type 2 diabetes: Hemoglobin A1c is 8.5. Thrombocytopenia: Likely in the setting of sepsis with platelets in October 2023 being normal. Elevated LFTs: Also likely in the setting of sepsis with negative CT imaging on 12/22/2022 at the outside hospital.  Hepatitis serologies are negative. Acute hypoxemic respiratory failure: CTA chest at OSH 12/23/2022 negative except for atelectasis in the left lower lobe.  Per primary team. Right wrist edema: Negative wrist  x-ray.  RECOMMENDATIONS:    Switch antibiotics from vancomycin to cefazolin Recommend TEE when he is more stable Lab monitoring, glucose control Continue to monitor clinically for any other metastatic sites of infection Following   Principal Problem:   Discitis of lumbar region Active Problems:   Diabetes mellitus type 2 in nonobese (HCC)   Dyslipidemia   Essential hypertension   New onset atrial fibrillation (HCC)   Sepsis (HCC)   BPH (benign prostatic hyperplasia)   Mood disorder (HCC)    MEDICATIONS:    Scheduled Meds:  acetaminophen  500 mg Oral TID   azelastine  1 spray Each Nare BID   escitalopram  10 mg Oral Daily   gabapentin  100 mg Oral BID   insulin aspart  0-15 Units Subcutaneous TID WC   insulin aspart  0-5 Units Subcutaneous QHS   lidocaine  2 patch Transdermal Q24H   loratadine  10 mg Oral Daily   pantoprazole  40 mg Oral BID   rosuvastatin  20 mg Oral Daily   sodium chloride flush  3 mL Intravenous Q12H   tamsulosin  0.4 mg Oral Daily   Continuous Infusions:  amiodarone 30 mg/hr (12/26/22 0800)    ceFAZolin (ANCEF) IV     lactated ringers 75 mL/hr at 12/25/22 1055   PRN Meds:.acetaminophen **OR** acetaminophen, methocarbamol, metoprolol tartrate, morphine injection, ondansetron **OR** ondansetron (ZOFRAN) IV, oxyCODONE  SUBJECTIVE:   24 hour events:  No acute events noted overnight next line MRI with highly unusual appearance of the L4 and L5 vertebral bodies consistent with vertebral osteonecrosis, infarction of those vertebral bodies.  Also possible basivertebral venous thrombosis.  Neurosurgery reports no surgical role for this issue.  Recommended gabapentin possibly for his neuropathy.  Cardiology also evaluated the patient and deemed he is unfit for a TEE at this time TTE did not show any large valvular vegetation, but was a difficult study Repeat blood cultures from here 5/28 remain no growth Platelets 63 WBC stable Creatinine  improved Tmax 99 F Remains on 2 L nasal cannula Tachycardic   Patient reports ongoing back pain.  He reports still having shooting pain in his legs.  He discussed his MRI results with neurosurgery earlier today.  Review of Systems  All other systems reviewed and are negative.     OBJECTIVE:   Blood pressure 133/66, pulse 76, temperature 99 F (37.2 C), temperature source Axillary, resp. rate 20, SpO2 97 %. There is no height or weight on file to calculate BMI.  Physical Exam Constitutional:      General: He is not in acute distress.    Appearance: He is ill-appearing.  HENT:     Head: Normocephalic and atraumatic.  Eyes:     Extraocular Movements: Extraocular movements intact.     Conjunctiva/sclera: Conjunctivae normal.  Pulmonary:     Effort: Pulmonary effort is normal. No respiratory distress.     Comments: He is currently on nasal cannula Abdominal:     General: There is no distension.     Palpations: Abdomen is soft.  Musculoskeletal:        General: Normal range of motion.     Cervical back: Normal range of motion and neck supple.     Right lower leg: No edema.     Left lower leg: No edema.  Skin:    General: Skin is warm and dry.     Coloration: Skin is not jaundiced.     Findings: No rash.  Neurological:     General: No focal deficit present.     Mental Status: He is alert and oriented to person, place, and time.  Psychiatric:        Mood and Affect: Mood normal.        Behavior: Behavior normal.      Lab Results: Lab Results  Component Value Date   WBC 15.4 (H) 12/26/2022   HGB 11.4 (L) 12/26/2022   HCT 34.8 (L) 12/26/2022   MCV 76.8 (L) 12/26/2022   PLT 63 (L) 12/26/2022    Lab Results  Component Value Date   NA 137 12/26/2022   K 3.6 12/26/2022   CO2 21 (L) 12/26/2022   GLUCOSE 190 (H) 12/26/2022   BUN 50 (H) 12/26/2022   CREATININE 1.06 12/26/2022   CALCIUM 8.1 (L) 12/26/2022   GFRNONAA >60 12/26/2022   GFRAA  05/09/2010    >60         The eGFR has been calculated using the MDRD equation. This calculation has not been validated in all clinical situations. eGFR's persistently <60 mL/min signify possible Chronic Kidney Disease.    Lab Results  Component Value Date   ALT 41 12/25/2022   AST 110 (H) 12/25/2022   ALKPHOS 76 12/25/2022   BILITOT 1.8 (H) 12/25/2022       Component Value Date/Time   CRP 48.2 (H) 12/24/2022 1646       Component Value Date/Time   ESRSEDRATE 43 (H) 12/24/2022 1646     I have reviewed the micro and lab results in Epic.  Imaging: DG Wrist Complete Right  Result Date: 12/25/2022 CLINICAL DATA:  Wrist pain and swelling for  1 week. No known injury. EXAM: RIGHT WRIST - COMPLETE 3+ VIEW COMPARISON:  None Available. FINDINGS: The mineralization and alignment are normal. There is no evidence of acute fracture or dislocation. There is mild joint space narrowing at the 1st carpometacarpal and scaphotrapeziotrapezoidal joints. There is also joint space narrowing at the 2nd and 3rd MCP joints. No erosive changes are identified. There is possible soft tissue swelling around the wrist without evidence of foreign body or soft tissue emphysema. IMPRESSION: 1. No acute osseous findings or evidence of inflammatory arthropathy. Mild degenerative changes as described. 2. Possible soft tissue swelling around the wrist. Electronically Signed   By: Carey Bullocks M.D.   On: 12/25/2022 19:09   ECHOCARDIOGRAM COMPLETE  Result Date: 12/25/2022    ECHOCARDIOGRAM REPORT   Patient Name:   Jim Ramirez Date of Exam: 12/25/2022 Medical Rec #:  [id number]        Height:       72.0 in Accession #:    [id number]       Weight:       208.0 lb Date of Birth:  05/12/1951        BSA:          2.166 m Patient Age:    72 years         BP:           119/80 mmHg Patient Gender: M                HR:           89 bpm. Exam Location:  Inpatient Procedure: 2D Echo, Cardiac Doppler and Color Doppler Indications:    A-FIB  History:         Patient has no prior history of Echocardiogram examinations.                 Arrythmias:Atrial Fibrillation; Risk Factors:Dyslipidemia,                 Diabetes and Hypertension.  Sonographer:    Darlys Gales Referring Phys: [id number] JENNIFER YATES IMPRESSIONS  1. Left ventricular ejection fraction, by estimation, is 65 to 70%. The left ventricle has normal function. Left ventricular endocardial border not optimally defined to evaluate regional wall motion. Indeterminate diastolic filling due to E-A fusion.  2. Right ventricular systolic function is hyperdynamic. The right ventricular size is not well visualized. Tricuspid regurgitation signal is inadequate for assessing PA pressure.  3. The mitral valve was not well visualized. No evidence of mitral valve regurgitation.  4. The aortic valve was not well visualized. Aortic valve regurgitation is not visualized. No aortic stenosis is present.  5. The inferior vena cava is dilated in size with >50% respiratory variability, suggesting right atrial pressure of 8 mmHg.  6. Technically difficult study. Comparison(s): No prior Echocardiogram. FINDINGS  Left Ventricle: Left ventricular ejection fraction, by estimation, is 65 to 70%. The left ventricle has normal function. Left ventricular endocardial border not optimally defined to evaluate regional wall motion. The left ventricular internal cavity size was normal in size. There is no left ventricular hypertrophy. Indeterminate diastolic filling due to E-A fusion. Right Ventricle: The right ventricular size is not well visualized. Right vetricular wall thickness was not well visualized. Right ventricular systolic function is hyperdynamic. Tricuspid regurgitation signal is inadequate for assessing PA pressure. Left Atrium: Left atrial size was normal in size. Right Atrium: Right atrial size was normal in size. Pericardium: There is no evidence of pericardial effusion. Mitral Valve:  The mitral valve was not well  visualized. No evidence of mitral valve regurgitation. Tricuspid Valve: The tricuspid valve is normal in structure. Tricuspid valve regurgitation is not demonstrated. No evidence of tricuspid stenosis. Aortic Valve: The aortic valve was not well visualized. Aortic valve regurgitation is not visualized. No aortic stenosis is present. Aortic valve mean gradient measures 8.0 mmHg. Aortic valve peak gradient measures 10.8 mmHg. Aortic valve area, by VTI measures 2.33 cm. Pulmonic Valve: The pulmonic valve was not well visualized. Pulmonic valve regurgitation is not visualized. Aorta: The aortic root and ascending aorta are structurally normal, with no evidence of dilitation. Venous: The inferior vena cava is dilated in size with greater than 50% respiratory variability, suggesting right atrial pressure of 8 mmHg. IAS/Shunts: The interatrial septum was not well visualized.  LEFT VENTRICLE PLAX 2D LVIDd:         4.30 cm   Diastology LVIDs:         3.20 cm   LV e' medial:    8.70 cm/s LV PW:         0.90 cm   LV E/e' medial:  7.1 LV IVS:        1.00 cm   LV e' lateral:   12.10 cm/s LVOT diam:     2.00 cm   LV E/e' lateral: 5.1 LV SV:         63 LV SV Index:   29 LVOT Area:     3.14 cm  RIGHT VENTRICLE             IVC RV S prime:     17.40 cm/s  IVC diam: 2.30 cm TAPSE (M-mode): 2.9 cm LEFT ATRIUM           Index        RIGHT ATRIUM           Index LA Vol (A2C): 27.8 ml 12.83 ml/m  RA Area:     11.10 cm LA Vol (A4C): 52.7 ml 24.33 ml/m  RA Volume:   21.80 ml  10.06 ml/m  AORTIC VALVE AV Area (Vmax):    2.70 cm AV Area (Vmean):   2.12 cm AV Area (VTI):     2.33 cm AV Vmax:           164.00 cm/s AV Vmean:          133.000 cm/s AV VTI:            0.271 m AV Peak Grad:      10.8 mmHg AV Mean Grad:      8.0 mmHg LVOT Vmax:         141.00 cm/s LVOT Vmean:        89.700 cm/s LVOT VTI:          0.201 m LVOT/AV VTI ratio: 0.74  AORTA Ao Root diam: 3.10 cm Ao Asc diam:  3.20 cm MITRAL VALVE MV Area (PHT): 3.74 cm    SHUNTS  MV Decel Time: 203 msec    Systemic VTI:  0.20 m MV E velocity: 61.80 cm/s  Systemic Diam: 2.00 cm MV A velocity: 89.10 cm/s MV E/A ratio:  0.69 Riley Lam MD Electronically signed by Riley Lam MD Signature Date/Time: 12/25/2022/4:21:59 PM    Final    MR Lumbar Spine W Wo Contrast  Addendum Date: 12/25/2022   ADDENDUM REPORT: 12/25/2022 11:53 ADDENDUM: This unusual case was discussed by telephone with Dr. Delma Officer, Neurosurgery on 12/25/2022 at 1139 hours. Electronically Signed   By:  Odessa Fleming M.D.   On: 12/25/2022 11:53   Result Date: 12/25/2022 CLINICAL DATA:  72 year old male with back pain. New onset atrial fibrillation. Abnormal prevertebral soft tissue gas and inflammation on recent [facility] lumbar spine CT 12/23/2022. EXAM: MRI LUMBAR SPINE WITHOUT AND WITH CONTRAST TECHNIQUE: Multiplanar and multiecho pulse sequences of the lumbar spine were obtained without and with intravenous contrast. CONTRAST:  9mL GADAVIST GADOBUTROL 1 MMOL/ML IV SOLN COMPARISON:  Lumbar spine CT 12/23/2022 and earlier. FINDINGS: Segmentation:  Normal on the recent CT. Alignment: Stable, and with maintained vertebral body height at this time. Vertebrae: Marrow signal heterogeneity at both the L4 and L5 vertebral bodies on precontrast imaging, with patchy T2 and STIR hyperintensity at both levels. But striking lack of enhancement of both vertebral bodies following contrast (series 8, image 11). The posterior elements of both levels are less affected although the hypoenhancement continues into the left L5 pedicle, and similar hypoenhancement of the right L4 pedicle. Small there are patchy areas of more preserved enhancement along the periphery of both vertebral bodies. No similar loss of bone enhancement elsewhere in the visible spine or sacrum. No other areas of marrow edema. Conus medullaris and cauda equina: Conus extends to the T12-L1 level. No lower spinal cord or conus signal abnormality.  No abnormal intradural enhancement is identified. No definite dural thickening. Paraspinal and other soft tissues: Epidural and paravertebral soft tissues are abnormal at both L4 and L5, see below. Maintained visible major vascular flow voids in the abdomen. Grossly negative visible abdominal viscera. Diverticulosis of the large bowel at the pelvic inlet. Partially visible distended urinary bladder. Disc levels: Largely normal for age MRI appearance of the spine from T12 through L3. L4 and L5: Heterogeneous and thickened appearance of the ventral epidural spaces at both levels, seemingly with absent enhancement of the epidural venous plexus there. Mild to moderate surrounding paravertebral soft tissue inflammation including some muscle edema in the medial psoas and erector spinae muscles. Abnormal fluid signal in the L4-L5 disc space. But no paraspinal fluid collection. No organized or rim enhancing fluid. Multifactorial spinal stenosis which appears in large part due to the heterogeneously expanded ventral epidural space at both levels. No significant lumbar spinal stenosis here on the 2021 MRI despite chronic disc and endplate degeneration at those levels. IMPRESSION: 1. 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 No vertebral body collapse at this time. Posterior elements relatively spared (although right L4 and left L5 pedicles are affected). Abnormal ventral epidural space at both levels which may be in part related to basivertebral venous thrombosis. 2. Subsequent surrounding soft tissue inflammation, abnormal signal in the intervening L4-L5 disc space, and multifactorial spinal stenosis at both levels. But the striking loss of vertebral body enhancement is consistent with osteonecrosis rather than osteomyelitis. 3. Other lumbar and sacral  levels appear unaffected. And visible lower thoracic spinal cord, conus medullaris, and cauda equina nerve roots above L4 seem to remain normal. Electronically Signed: By: Odessa Fleming M.D. On: 12/25/2022 11:04     Imaging independently reviewed in Epic.    [facility] Coffer for Infectious Disease [facility] [phone] pager 12/26/2022, 10:54 AM

## 2022-12-26 NOTE — Consult Note (Signed)
Palliative Care Consult Note                                  Date: 12/26/2022   Patient Name: Jim Ramirez  DOB: 1950/10/28  MRN: [id number]  Age / Sex: 72 y.o., male  PCP: [name], MD Referring Physician: Rolly Salter, MD  Reason for Consultation: Establishing goals of care  HPI/Patient Profile: 72 y.o. male  with past medical history of DM and HTN who initially presented to [facility] on 12/22/2022 with complaint of back pain with radiation down both legs.  He was found to have MSSA bacteremia and osteonecrosis of the lumbar spine.  He was also found to have new onset A-fib with RVR.  He was transferred to [facility] on 12/24/2022.   Palliative Medicine has been consulted for goals of care conversations.  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   CTA negative for aneurysm   Depressed    Diabetes mellitus without complication (HCC)    DVT (deep venous thrombosis) (HCC)    History of kidney stones    Hypertension    Nephrolithiasis    Sleep apnea     Subjective:   I have reviewed medical records including progress notes, labs and imaging, and received an update from the RN.    I met with patient at bedside to discuss diagnosis, prognosis, and GOC. He is awake and asking for water. He is so weak, that he is unable to bring the cup to his mouth.   I introduced Palliative Medicine as specialized medical care for people living with serious illness. It focuses on providing relief from the symptoms and stress of a serious illness.   A brief life review was discussed.  Patient tells me he has been divorced twice.  He has a son and a daughter, but seems to indicate they do not have a close relationship.  He shares that his main family support is his brother and sister. He states he lives with his brother.   We discussed patient's current illness and what it means  in the larger context of his ongoing co-morbidities. Current clinical status was reviewed. Patient is able to tell me he is in the hospital due to an issue with his back and now with his heart, however he is not able to verbalize any details about his acute medical issues.  I reviewed with patient that he has a infection in his bloodstream as well as osteonecrosis of his lumbar spine.  I explained that osteonecrosis means the bone tissue has died and he is at risk for vertebral body collapse.  If this were to occur, patient does indicate he is aware that he is not currently a surgical candidate.  Values and goals of care were attempted to be elicited.  Patient states that his goal is to "get better".  A discussion was had today regarding advanced directives.  Patient states that he would want his sister and his brother to make medical decisions for him if he was unable to make those decisions for himself.  We discussed the option to create AD documents while in the hospital, and he is agreeable.  He would wish to designate his sister as primary healthcare agent and his brother as alternate.   We did discuss code status. Encouraged patient to consider DNR/DNI status understanding evidenced based poor outcomes  in similar hospitalized patients, as the cause of the arrest is likely associated with chronic/terminal disease rather than a reversible acute cardio-pulmonary event.  When I asked patient if he would want resuscitation efforts in the event of cardiac arrest, he states "no" but is not able to repeat this information back to me.  Overall, it was difficult to have a meaningful GOC discussion with patient due to his significant weakness and fatigue.  He becomes quite drowsy during our discussion.  It is unclear to what extent he understands the seriousness of his current medical situation.  I have attempted to contact family to further discuss GOC but was unsuccessful.   Plan for follow-up  tomorrow.    Review of Systems  Musculoskeletal:  Positive for back pain.    Objective:   Primary Diagnoses: Present on Admission:  New onset atrial fibrillation (HCC)  Discitis of lumbar region  Sepsis [facility])  Essential hypertension  Dyslipidemia  BPH (benign prostatic hyperplasia)  Mood disorder [facility])   Physical Exam Vitals reviewed.  Constitutional:      General: He is awake. He is not in acute distress.    Appearance: He is ill-appearing.     Comments: drowsy  Pulmonary:     Effort: Pulmonary effort is normal.  Neurological:     Mental Status: He is oriented to person, place, and time.     Motor: Weakness present.     Vital Signs:  BP (!) 148/110 (BP Location: Right Arm)   Pulse 84   Temp 99.4 F (37.4 C) (Oral)   Resp 19   SpO2 100%   Palliative Assessment/Data: PPS 30%     Assessment & Plan:   SUMMARY OF RECOMMENDATIONS   Full code for now - patient seems to indicate he would agree with DNR status but will need to discuss with family prior to changing code status Continue current interventions PMT will follow-up tomorrow  Primary Decision Maker: Unclear   Prognosis:  guarded  Discharge Planning:  To Be Determined   Discussed with: Dr. Allena Katz, RN, and case manager   Thank you for allowing [place] to participate in the care of [name]  MDM - High  Signed by: Sherlean Foot, NP Palliative Medicine Team  Team Phone # [phone]  For individual providers, please see AMION

## 2022-12-26 NOTE — Plan of Care (Signed)
TTE shows normal LV function. No large vegetations on his valves; although the windows are limited. Can consider repeat limited echo prior to dispo or TEE if he has improvement in his clinical status. Can continue IV amio.

## 2022-12-26 NOTE — Progress Notes (Signed)
Subjective: The patient is alert and pleasant.  He is short of breath.  He tells me his neck is doing well.  He still has back pain and chronic numbness in his feet.  Objective: Vital signs in last 24 hours: Temp:  [97.9 F (36.6 C)-99 F (37.2 C)] 99 F (37.2 C) (05/30 0740) Pulse Rate:  [76-104] 76 (05/30 0740) Resp:  [17-29] 20 (05/30 0740) BP: (101-133)/(51-85) 133/66 (05/30 0740) SpO2:  [91 %-97 %] 97 % (05/30 0740) Estimated body mass index is 28.21 kg/m as calculated from the following:   Height as of 10/07/22: 6' (1.829 m).   Weight as of 10/07/22: 94.3 kg.   Intake/Output from previous day: 05/29 0701 - 05/30 0700 In: 1220.1 [P.O.:480; I.V.:390.1; IV Piggyback:350] Out: 2300 [Urine:2300] Intake/Output this shift: Total I/O In: 134.1 [I.V.:134.1] Out: 550 [Urine:550]  Physical exam the patient is alert and oriented.  His lower extremity strength is grossly normal with some giveaway.  Lab Results: Recent Labs    12/25/22 1528 12/26/22 0031  WBC 15.0* 15.4*  HGB 11.8* 11.4*  HCT 36.7* 34.8*  PLT PLATELET CLUMPS NOTED ON SMEAR, COUNT APPEARS DECREASED 63*   BMET Recent Labs    12/25/22 0101 12/26/22 0031  NA 136 137  K 3.5 3.6  CL 102 103  CO2 18* 21*  GLUCOSE 213* 190*  BUN 56* 50*  CREATININE 1.28* 1.06  CALCIUM 8.2* 8.1*    Studies/Results: DG Wrist Complete Right  Result Date: 12/25/2022 CLINICAL DATA:  Wrist pain and swelling for 1 week. No known injury. EXAM: RIGHT WRIST - COMPLETE 3+ VIEW COMPARISON:  None Available. FINDINGS: The mineralization and alignment are normal. There is no evidence of acute fracture or dislocation. There is mild joint space narrowing at the 1st carpometacarpal and scaphotrapeziotrapezoidal joints. There is also joint space narrowing at the 2nd and 3rd MCP joints. No erosive changes are identified. There is possible soft tissue swelling around the wrist without evidence of foreign body or soft tissue emphysema. IMPRESSION:  1. No acute osseous findings or evidence of inflammatory arthropathy. Mild degenerative changes as described. 2. Possible soft tissue swelling around the wrist. Electronically Signed   By: Carey Bullocks M.D.   On: 12/25/2022 19:09   ECHOCARDIOGRAM COMPLETE  Result Date: 12/25/2022    ECHOCARDIOGRAM REPORT   Patient Name:   Jim Ramirez Date of Exam: 12/25/2022 Medical Rec #:  [id number]        Height:       72.0 in Accession #:    [id number]       Weight:       208.0 lb Date of Birth:  11-16-1950        BSA:          2.166 m Patient Age:    72 years         BP:           119/80 mmHg Patient Gender: M                HR:           89 bpm. Exam Location:  Inpatient Procedure: 2D Echo, Cardiac Doppler and Color Doppler Indications:    A-FIB  History:        Patient has no prior history of Echocardiogram examinations.                 Arrythmias:Atrial Fibrillation; Risk Factors:Dyslipidemia,  Diabetes and Hypertension.  Sonographer:    Darlys Gales Referring Phys: [id number] JENNIFER YATES IMPRESSIONS  1. Left ventricular ejection fraction, by estimation, is 65 to 70%. The left ventricle has normal function. Left ventricular endocardial border not optimally defined to evaluate regional wall motion. Indeterminate diastolic filling due to E-A fusion.  2. Right ventricular systolic function is hyperdynamic. The right ventricular size is not well visualized. Tricuspid regurgitation signal is inadequate for assessing PA pressure.  3. The mitral valve was not well visualized. No evidence of mitral valve regurgitation.  4. The aortic valve was not well visualized. Aortic valve regurgitation is not visualized. No aortic stenosis is present.  5. The inferior vena cava is dilated in size with >50% respiratory variability, suggesting right atrial pressure of 8 mmHg.  6. Technically difficult study. Comparison(s): No prior Echocardiogram. FINDINGS  Left Ventricle: Left ventricular ejection fraction, by estimation,  is 65 to 70%. The left ventricle has normal function. Left ventricular endocardial border not optimally defined to evaluate regional wall motion. The left ventricular internal cavity size was normal in size. There is no left ventricular hypertrophy. Indeterminate diastolic filling due to E-A fusion. Right Ventricle: The right ventricular size is not well visualized. Right vetricular wall thickness was not well visualized. Right ventricular systolic function is hyperdynamic. Tricuspid regurgitation signal is inadequate for assessing PA pressure. Left Atrium: Left atrial size was normal in size. Right Atrium: Right atrial size was normal in size. Pericardium: There is no evidence of pericardial effusion. Mitral Valve: The mitral valve was not well visualized. No evidence of mitral valve regurgitation. Tricuspid Valve: The tricuspid valve is normal in structure. Tricuspid valve regurgitation is not demonstrated. No evidence of tricuspid stenosis. Aortic Valve: The aortic valve was not well visualized. Aortic valve regurgitation is not visualized. No aortic stenosis is present. Aortic valve mean gradient measures 8.0 mmHg. Aortic valve peak gradient measures 10.8 mmHg. Aortic valve area, by VTI measures 2.33 cm. Pulmonic Valve: The pulmonic valve was not well visualized. Pulmonic valve regurgitation is not visualized. Aorta: The aortic root and ascending aorta are structurally normal, with no evidence of dilitation. Venous: The inferior vena cava is dilated in size with greater than 50% respiratory variability, suggesting right atrial pressure of 8 mmHg. IAS/Shunts: The interatrial septum was not well visualized.  LEFT VENTRICLE PLAX 2D LVIDd:         4.30 cm   Diastology LVIDs:         3.20 cm   LV e' medial:    8.70 cm/s LV PW:         0.90 cm   LV E/e' medial:  7.1 LV IVS:        1.00 cm   LV e' lateral:   12.10 cm/s LVOT diam:     2.00 cm   LV E/e' lateral: 5.1 LV SV:         63 LV SV Index:   29 LVOT Area:      3.14 cm  RIGHT VENTRICLE             IVC RV S prime:     17.40 cm/s  IVC diam: 2.30 cm TAPSE (M-mode): 2.9 cm LEFT ATRIUM           Index        RIGHT ATRIUM           Index LA Vol (A2C): 27.8 ml 12.83 ml/m  RA Area:     11.10 cm LA Vol (A4C):  52.7 ml 24.33 ml/m  RA Volume:   21.80 ml  10.06 ml/m  AORTIC VALVE AV Area (Vmax):    2.70 cm AV Area (Vmean):   2.12 cm AV Area (VTI):     2.33 cm AV Vmax:           164.00 cm/s AV Vmean:          133.000 cm/s AV VTI:            0.271 m AV Peak Grad:      10.8 mmHg AV Mean Grad:      8.0 mmHg LVOT Vmax:         141.00 cm/s LVOT Vmean:        89.700 cm/s LVOT VTI:          0.201 m LVOT/AV VTI ratio: 0.74  AORTA Ao Root diam: 3.10 cm Ao Asc diam:  3.20 cm MITRAL VALVE MV Area (PHT): 3.74 cm    SHUNTS MV Decel Time: 203 msec    Systemic VTI:  0.20 m MV E velocity: 61.80 cm/s  Systemic Diam: 2.00 cm MV A velocity: 89.10 cm/s MV E/A ratio:  0.69 Riley Lam MD Electronically signed by Riley Lam MD Signature Date/Time: 12/25/2022/4:21:59 PM    Final    MR Lumbar Spine W Wo Contrast  Addendum Date: 12/25/2022   ADDENDUM REPORT: 12/25/2022 11:53 ADDENDUM: This unusual case was discussed by telephone with Dr. Delma Officer, Neurosurgery on 12/25/2022 at 1139 hours. Electronically Signed   By: Odessa Fleming M.D.   On: 12/25/2022 11:53   Result Date: 12/25/2022 CLINICAL DATA:  72 year old male with back pain. New onset atrial fibrillation. Abnormal prevertebral soft tissue gas and inflammation on recent [facility] lumbar spine CT 12/23/2022. EXAM: MRI LUMBAR SPINE WITHOUT AND WITH CONTRAST TECHNIQUE: Multiplanar and multiecho pulse sequences of the lumbar spine were obtained without and with intravenous contrast. CONTRAST:  9mL GADAVIST GADOBUTROL 1 MMOL/ML IV SOLN COMPARISON:  Lumbar spine CT 12/23/2022 and earlier. FINDINGS: Segmentation:  Normal on the recent CT. Alignment: Stable, and with maintained vertebral body height at this time.  Vertebrae: Marrow signal heterogeneity at both the L4 and L5 vertebral bodies on precontrast imaging, with patchy T2 and STIR hyperintensity at both levels. But striking lack of enhancement of both vertebral bodies following contrast (series 8, image 11). The posterior elements of both levels are less affected although the hypoenhancement continues into the left L5 pedicle, and similar hypoenhancement of the right L4 pedicle. Small there are patchy areas of more preserved enhancement along the periphery of both vertebral bodies. No similar loss of bone enhancement elsewhere in the visible spine or sacrum. No other areas of marrow edema. Conus medullaris and cauda equina: Conus extends to the T12-L1 level. No lower spinal cord or conus signal abnormality. No abnormal intradural enhancement is identified. No definite dural thickening. Paraspinal and other soft tissues: Epidural and paravertebral soft tissues are abnormal at both L4 and L5, see below. Maintained visible major vascular flow voids in the abdomen. Grossly negative visible abdominal viscera. Diverticulosis of the large bowel at the pelvic inlet. Partially visible distended urinary bladder. Disc levels: Largely normal for age MRI appearance of the spine from T12 through L3. L4 and L5: Heterogeneous and thickened appearance of the ventral epidural spaces at both levels, seemingly with absent enhancement of the epidural venous plexus there. Mild to moderate surrounding paravertebral soft tissue inflammation including some muscle edema in the medial psoas and erector spinae muscles. Abnormal  fluid signal in the L4-L5 disc space. But no paraspinal fluid collection. No organized or rim enhancing fluid. Multifactorial spinal stenosis which appears in large part due to the heterogeneously expanded ventral epidural space at both levels. No significant lumbar spinal stenosis here on the 2021 MRI despite chronic disc and endplate degeneration at those levels.  IMPRESSION: 1. 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 No vertebral body collapse at this time. Posterior elements relatively spared (although right L4 and left L5 pedicles are affected). Abnormal ventral epidural space at both levels which may be in part related to basivertebral venous thrombosis. 2. Subsequent surrounding soft tissue inflammation, abnormal signal in the intervening L4-L5 disc space, and multifactorial spinal stenosis at both levels. But the striking loss of vertebral body enhancement is consistent with osteonecrosis rather than osteomyelitis. 3. Other lumbar and sacral levels appear unaffected. And visible lower thoracic spinal cord, conus medullaris, and cauda equina nerve roots above L4 seem to remain normal. Electronically Signed: By: Odessa Fleming M.D. On: 12/25/2022 11:04    Assessment/Plan: Vertebral osteonecrosis/infarction: The patient's CAT scans and MRI scan are quite unusually appearing most consistent with vertebral body infarction/osteonecrosis.  I think less likely is infection.  He has been empirically treated with antibiotics.  There is nothing to do presently but there is a chance of vertebral body collapse.  If this occurs he would need extensive reconstruction/stabilization.  He does not medically fit for this presently.  I will order lumbosacral corset.  LOS: 2 days     Cristi Loron 12/26/2022, 8:02 AM     Patient ID: Jim Ramirez, male   DOB: November 07, 1950, 72 y.o.   MRN: [id number]

## 2022-12-26 NOTE — Progress Notes (Signed)
[facility] Progress Note Patient: Jim Ramirez ZHY:[id number] DOB: 1951-03-22 DOA: 12/24/2022  DOS: the patient was seen and examined on 12/26/2022  Brief hospital course: PMH of HTN and DM presenting with back pain.   He initially presented to [facility] on 5/26 with c/o chronic back pain with radiation down both legs.  He also noted SOB x 1 week.  Found to have Staph aureus bacteremia with osteonecrosis of the lumbar spine.  Also had A-fib with RVR.  Neurosurgery, ID, cardiology following. Assessment and Plan: Sepsis secondary to Staph aureus infection and bacteremia, osteomyelitis and lumbar spine discitis. Present on admission. SIRS criteria with leukocytosis tachycardia and tachypnea. Blood culture at [facility] growing MSSA. Repeat blood cultures ordered.  So far no growth. ID following. Initially was on IV vancomycin.  Now on cefazolin. Echo shows no evidence of vegetation.  Preserved EF.  TEE recommended once more stable. Neurosurgery was also consulted.  Currently dating less likely infection.  Recommend no indication for surgical intervention currently.   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 Prior history of cervical fusion October 2023. Neurosurgery consulted.  Known to Dr. Lovell Sheehan. Appearance of the MRI per neurosurgery is consistent with vertebral body infarction or necrosis.  Less likely infection. Recommend conservative measures. Informed that there is a chance of vertebral body collapse. If this occurs he would need extensive reconstruction/stabilization.  Neurosurgery will order lumbosacral corset.  Neurosurgery Recommend gabapentin. Monitor.   New onset A-fib. Cardiology consulted. Echocardiogram shows preserved EF 65 to 70% without any wall motion abnormality.  Hyperdynamic RV.  No valvular abnormality. Currently on amiodarone drip. Transition to PO closer to dispo: amiodarone 400 mg BID for 10 days then 200 mg daily.  Not a good candidate  for  anticoagulation.  Acute thrombocytopenia. Baseline platelet count normal. Platelet count 63 currently.  Likely in the setting of marrow suppression due to sepsis and infection.  Avoid chemical DVT prophylaxis.  Cardiology currently recommending to hold off on anticoagulation as well as TEE. Once the platelets are stable cardiology recommend to consider anticoagulation though.  Right wrist edema. No fall no trauma no injury reported.  X-ray shows soft tissue swelling.  Will get MRI.  AKI. Baseline serum creatinine around 0.7. On admission serum creatinine 1.23. Improved to 1.26. Monitor.  Elevated LFT and as well as hyperbilirubinemia. AST 90s ALT normal.  Bilirubin 1.8.  Monitor.  Elevated troponin. Likely demand ischemia. Not a candidate for anticoagulation.  Dyspnea. CTA chest with contrast on 12/23/2022 at outside facility negative for PE.  No pneumonia or pleural effusion or pneumothorax. Chest x-ray on 5/30 negative for pneumonia or vascular congestion. Monitor and continue supportive care.  BPH. Continue Flomax.   Type 2 diabetes mellitus, uncontrolled with hyperglycemia with long-term insulin use. Hemoglobin A1c 8.1.  On sliding scale insulin. Monitor.   HLD. On statin.   Mood disorder. On Lexapro.   OSA. Follows up with pulmonary outpatient. Continue CPAP nightly.   Subjective: Continues to complain of back pain.  No nausea no vomiting.  Reports shortness of breath as well as some cough.  No chest pain no chest tightness.  Physical Exam: General: in moderate distress, No Rash Cardiovascular: S1 and S2 Present, No Murmur Respiratory: Increased respiratory effort, Bilateral Air entry present.  Basal crackles, No wheezes Abdomen: Bowel Sound present, No tenderness Extremities: No edema Neuro: Alert and oriented x3, no new focal deficit  Data Reviewed: I have Reviewed nursing notes, Vitals, and Lab results. Since last encounter, pertinent lab  results CBC and  BMP   . I have ordered test including CBC BMP  . I have discussed pt's care plan and test results with ID  . I have ordered imaging chest x-ray  .   Disposition: Status is: Inpatient Remains inpatient appropriate because: Need for further workup and therapy for MSSA bacteremia  SCDs Start: 12/24/22 1740   Family Communication: Discussed with sister and brother at bedside Level of care: Progressive   Vitals:   12/26/22 0600 12/26/22 0740 12/26/22 1100 12/26/22 1537  BP: (!) 121/59 133/66 (!) 148/110 (!) 149/66  Pulse:  76 84 90  Resp: 20 20 19  (!) 25  Temp:  99 F (37.2 C) 99.4 F (37.4 C) 98.1 F (36.7 C)  TempSrc:  Axillary Oral Oral  SpO2: 94% 97% 100% 95%     Author: Lynden Oxford, MD 12/26/2022 4:22 PM  Please look on [email or web address] to find out who is on call.

## 2022-12-26 NOTE — Plan of Care (Signed)
  Problem: Education: Goal: Knowledge of General Education information will improve Description: Including pain rating scale, medication(s)/side effects and non-pharmacologic comfort measures Outcome: Progressing   Problem: Health Behavior/Discharge Planning: Goal: Ability to manage health-related needs will improve Outcome: Progressing   Problem: Clinical Measurements: Goal: Ability to maintain clinical measurements within normal limits will improve Outcome: Progressing Goal: Respiratory complications will improve Outcome: Progressing Goal: Cardiovascular complication will be avoided Outcome: Progressing   Problem: Nutrition: Goal: Adequate nutrition will be maintained Outcome: Progressing   Problem: Coping: Goal: Level of anxiety will decrease Outcome: Progressing   Problem: Elimination: Goal: Will not experience complications related to bowel motility Outcome: Progressing Goal: Will not experience complications related to urinary retention Outcome: Progressing

## 2022-12-27 ENCOUNTER — Encounter (HOSPITAL_COMMUNITY): Payer: Medicare HMO

## 2022-12-27 ENCOUNTER — Other Ambulatory Visit: Payer: Self-pay

## 2022-12-27 ENCOUNTER — Inpatient Hospital Stay (HOSPITAL_COMMUNITY): Payer: Medicare HMO

## 2022-12-27 DIAGNOSIS — E119 Type 2 diabetes mellitus without complications: Secondary | ICD-10-CM | POA: Diagnosis not present

## 2022-12-27 DIAGNOSIS — B9561 Methicillin susceptible Staphylococcus aureus infection as the cause of diseases classified elsewhere: Secondary | ICD-10-CM | POA: Diagnosis not present

## 2022-12-27 DIAGNOSIS — M4646 Discitis, unspecified, lumbar region: Secondary | ICD-10-CM | POA: Diagnosis not present

## 2022-12-27 DIAGNOSIS — Z515 Encounter for palliative care: Secondary | ICD-10-CM | POA: Diagnosis not present

## 2022-12-27 DIAGNOSIS — I4891 Unspecified atrial fibrillation: Secondary | ICD-10-CM | POA: Diagnosis not present

## 2022-12-27 DIAGNOSIS — R7881 Bacteremia: Secondary | ICD-10-CM | POA: Diagnosis not present

## 2022-12-27 LAB — QUANTIFERON-TB GOLD PLUS (RQFGPL)
QuantiFERON Mitogen Value: 1.51 IU/mL
QuantiFERON Nil Value: 0 IU/mL
QuantiFERON TB1 Ag Value: 0 IU/mL
QuantiFERON TB2 Ag Value: 0 IU/mL

## 2022-12-27 LAB — CBC
HCT: 34.4 % — ABNORMAL LOW (ref 39.0–52.0)
Hemoglobin: 11.5 g/dL — ABNORMAL LOW (ref 13.0–17.0)
MCH: 26.1 pg (ref 26.0–34.0)
MCHC: 33.4 g/dL (ref 30.0–36.0)
MCV: 78.2 fL — ABNORMAL LOW (ref 80.0–100.0)
Platelets: DECREASED 10*3/uL (ref 150–400)
RBC: 4.4 MIL/uL (ref 4.22–5.81)
RDW: 15.5 % (ref 11.5–15.5)
WBC: 16.1 10*3/uL — ABNORMAL HIGH (ref 4.0–10.5)
nRBC: 0 % (ref 0.0–0.2)

## 2022-12-27 LAB — QUANTIFERON-TB GOLD PLUS: QuantiFERON-TB Gold Plus: NEGATIVE

## 2022-12-27 LAB — GLUCOSE, CAPILLARY
Glucose-Capillary: 167 mg/dL — ABNORMAL HIGH (ref 70–99)
Glucose-Capillary: 141 mg/dL — ABNORMAL HIGH (ref 70–99)
Glucose-Capillary: 206 mg/dL — ABNORMAL HIGH (ref 70–99)
Glucose-Capillary: 166 mg/dL — ABNORMAL HIGH (ref 70–99)

## 2022-12-27 LAB — BASIC METABOLIC PANEL
Anion gap: 10 (ref 5–15)
BUN: 33 mg/dL — ABNORMAL HIGH (ref 8–23)
CO2: 26 mmol/L (ref 22–32)
Calcium: 8 mg/dL — ABNORMAL LOW (ref 8.9–10.3)
Chloride: 102 mmol/L (ref 98–111)
Creatinine, Ser: 0.81 mg/dL (ref 0.61–1.24)
GFR, Estimated: >60 mL/min (ref >60)
Glucose, Bld: 191 mg/dL — ABNORMAL HIGH (ref 70–99)
Potassium: 4.2 mmol/L (ref 3.5–5.1)
Sodium: 138 mmol/L (ref 135–145)

## 2022-12-27 LAB — HEPATIC FUNCTION PANEL
ALT: 35 U/L (ref 0–44)
AST: 93 U/L — ABNORMAL HIGH (ref 15–41)
Albumin: <1.5 g/dL — ABNORMAL LOW (ref 3.5–5.0)
Alkaline Phosphatase: 118 U/L (ref 38–126)
Bilirubin, Direct: 0.3 mg/dL — ABNORMAL HIGH (ref 0.0–0.2)
Indirect Bilirubin: 1.1 mg/dL — ABNORMAL HIGH (ref 0.3–0.9)
Total Bilirubin: 1.4 mg/dL — ABNORMAL HIGH (ref 0.3–1.2)
Total Protein: 4.6 g/dL — ABNORMAL LOW (ref 6.5–8.1)

## 2022-12-27 LAB — CULTURE, BLOOD (ROUTINE X 2): Special Requests: ADEQUATE

## 2022-12-27 LAB — MAGNESIUM: Magnesium: 2.4 mg/dL (ref 1.7–2.4)

## 2022-12-27 MED ORDER — GADOBUTROL 1 MMOL/ML IV SOLN
9.0000 mL | Freq: Once | INTRAVENOUS | Status: AC | PRN
  Administered 2022-12-27: 9 mL via INTRAVENOUS

## 2022-12-27 MED ORDER — KETOTIFEN FUMARATE 0.035 % OP SOLN
1.0000 [drp] | Freq: Two times a day (BID) | OPHTHALMIC | Status: DC
  Administered 2022-12-27 – 2023-01-28 (×64): 1 [drp] via OPHTHALMIC
  Filled 2022-12-27 (×3): qty 5

## 2022-12-27 NOTE — Evaluation (Signed)
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   CTA negative for aneurysm   Depressed    Diabetes mellitus without complication (HCC)    DVT (deep venous thrombosis) (HCC)    History of kidney stones    Hypertension    Nephrolithiasis    Sleep apnea    Past Surgical History:  Past Surgical History:  Procedure Laterality Date   ANTERIOR CERVICAL DECOMP/DISCECTOMY FUSION N/A 05/22/2022   Procedure: ANTERIOR CERVICAL DISCECTOMY FUSION, INTERBODY PROSTHESIS,PLATE/SCREWS CERVICAL THREE-FOUR, CERVICAL FOUR-FIVE;REMOVAL CERVICAL PLATE;  Surgeon: Tressie Stalker, MD;  Location: [facility] OR;  Service: Neurosurgery;  Laterality: N/A;  3C   CATARACT EXTRACTION W/PHACO Left 09/23/2022   Procedure: CATARACT EXTRACTION PHACO AND INTRAOCULAR LENS PLACEMENT (IOC);  Surgeon: Fabio Pierce, MD;  Location: [facility] ORS;  Service: Ophthalmology;  Laterality: Left;  CDE: 7.52   CATARACT EXTRACTION W/PHACO Right 10/07/2022   Procedure: CATARACT EXTRACTION PHACO AND INTRAOCULAR LENS PLACEMENT (IOC);  Surgeon: Fabio Pierce, MD;  Location: [facility] ORS;  Service: Ophthalmology;  Laterality: Right;  CDE: 9.40   STOMACH SURGERY     TONSILLECTOMY  2000   HPI:  Jim Ramirez is a 72 yo male who initially presented to [facility] on 5/26 with c/o chronic back pain with radiation down both legs.  He also noted SOB x 1 week.  Found to have Staph aureus bacteremia with osteonecrosis of the lumbar spine.  Also had A-fib with RVR.  Neurosurgery, ID, cardiology following. CXR 5/30 with no acute findings.  MRI pending.  Pt with PMH of HTN and DM.    Assessment / Plan / Recommendation  Clinical  Impression  Pt presents with clinical indicators of pharyngeal dysphagia. Pt c/o difficulty swallowing.  Voice was intermittently aphonic. SLP placed dentures prior to administration of PO trials. Dentures were in a container with black coating on sides and around seal.  SLP manually cleaned dentures with toothbrush, warm water, and washcloth.  Pt required multiple swallows per bolus with thin liquid and solids. Stated it wouldn't go down.  Pt tolerated puree with good oral clearance and subjectively reported that it was much easier to swallow.  With solids there was mild diffuse oral residue.  There was no cough or wet vocal qualilty noted, but aspiration cannot be ruled out at bedside.  Given pt's spinal abnormalities, a FEES is recommend over MBSS.  Pt with stat MRI ordered to evaluate for possibility of stroke.  Pt with questionable L facial droop/ptosis.  SLP will plan to complete FEES following MRI.    Recommend pt remain NPO at this time. Medications can be given crushed in puree, and pt may have puree snacks from floor stock with 1:1 assistance.  Pt may have ice chips and small amounts of water in moderation, after good oral care, with upright positioning as able, when fully awake/alert, with 1:1 assitance.   SLP Visit Diagnosis: Dysphagia, unspecified (R13.10)    Aspiration Risk  Moderate aspiration risk    Diet Recommendation Ice chips PRN after oral care;NPO except meds   Medication Administration: Crushed with puree    Other  Recommendations Oral Care Recommendations: Oral care QID;Oral care prior to ice chip/H20    Recommendations for  follow up therapy are one component of a multi-disciplinary discharge planning process, led by the attending physician.  Recommendations may be updated based on patient status, additional functional criteria and insurance authorization.  Follow up Recommendations  (pending results of instrumental assessment)      Assistance Recommended at Discharge   N/A  Functional Status Assessment  (pending results of instrumental assessment)  Frequency and Duration  (pending results of instrumental assessment)          Prognosis Prognosis for improved oropharyngeal function:  (pending results of instrumental assessment)      Swallow Study   General Date of Onset: 12/22/22 HPI: [name] is a 72 yo male who initially presented to [facility] on 5/26 with c/o chronic back pain with radiation down both legs.  He also noted SOB x 1 week.  Found to have Staph aureus bacteremia with osteonecrosis of the lumbar spine.  Also had A-fib with RVR.  Neurosurgery, ID, cardiology following. CXR 5/30 with no acute findings.  MRI pending.  Pt with PMH of HTN and DM. Type of Study: Bedside Swallow Evaluation Previous Swallow Assessment: none Diet Prior to this Study: NPO Respiratory Status: Room air History of Recent Intubation: No Behavior/Cognition: Alert;Cooperative Oral Cavity Assessment: Within Functional Limits Oral Care Completed by SLP:  (dentures cleaned prior to placement) Oral Cavity - Dentition: Dentures, top;Dentures, bottom Self-Feeding Abilities: Total assist Patient Positioning: Upright in bed Baseline Vocal Quality: Aphonic;Breathy Volitional Cough: Weak Volitional Swallow: Able to elicit    Oral/Motor/Sensory Function Overall Oral Motor/Sensory Function: Mild impairment Facial ROM: Within Functional Limits Facial Symmetry: Abnormal symmetry left Lingual ROM: Within Functional Limits Lingual Symmetry: Within Functional Limits Lingual Strength: Reduced Velum: Within Functional Limits Mandible: Within Functional Limits   Ice Chips Ice chips: Not tested   Thin Liquid Thin Liquid: Impaired Presentation: Cup Oral Phase Functional Implications: Right anterior spillage;Left anterior spillage Pharyngeal  Phase Impairments: Multiple swallows    Nectar Thick Nectar Thick Liquid: Not tested   Honey Thick Honey Thick Liquid: Not tested   Puree  Puree: Within functional limits Presentation: Spoon   Solid     Solid: Impaired Oral Phase Functional Implications: Prolonged oral transit;Oral residue Pharyngeal Phase Impairments: Multiple swallows      Kerrie Pleasure, MA, CCC-SLP Acute Rehabilitation Services Office: [phone] 12/27/2022,11:56 AM

## 2022-12-27 NOTE — Progress Notes (Signed)
Orthopedic Tech Progress Note Patient Details:  Jim Ramirez 10-03-1950 [id number]  Patient ID: [name], male   DOB: 03/29/1951, 72 y.o.   MRN: [id number] Nursing called to come fit patient for LSO delivered 5/30.  Pt currently in bed needing +2 assist for rolling and not safe to transfer EOB at this time.  Did not attempt fitting pt in bed as LSO is for mobility per neurosurgery note and pt already with increased generalized pain with partial roll.  Will hold off and fit pt when he is more stable or during therapy if MD feels appropriate to order.    Perrin Maltese, OTR/L Acute Rehabilitation Services  Office [phone] 12/27/2022

## 2022-12-27 NOTE — Progress Notes (Signed)
Palliative Medicine Progress Note   Patient Name: Jim Ramirez       Date: 12/27/2022 DOB: 1950-08-19  Age: 72 y.o. MRN#: [id number] Attending Physician: Rolly Salter, MD Primary Care Physician: [name], MD Admit Date: 12/24/2022    HPI/Patient Profile: 73 y.o. male  with past medical history of DM and HTN who initially presented to [facility] on 12/22/2022 with complaint of back pain with radiation down both legs.  He was found to have MSSA bacteremia and osteonecrosis of the lumbar spine.  He was also found to have new onset A-fib with RVR.  He was transferred to [facility] on 12/24/2022.    Palliative Medicine has been consulted for goals of care conversations.  Subjective: Chart reviewed, update received from RN, and patient assessed at bedside. Patient was seen by earlier today by SLP, with recommendation to remain NPO and obtain FEES. Note MRI ordered to evaluate for stroke, as patient has possible L facial droop. MRI wrist also ordered due to right wrist swelling.  On my assessment, patient seems more lethargic and confused compared to yesterday. He remains on amiodarone infusion.   I later spoke with his sister Harriett Sine by phone. I introduced myself and the role of Palliative Medicine. She shares that patient has been on disability for many years, she thinks due to chronic back issues.   Education offered regarding the seriousness of patient's current medical situation and his guarded prognosis. We discussed that he has multiple acute issues including sepsis secondary to MSSA bacteremia, new onset a-fib, AKI (improved), low platelets, and osteonecrosis of the lumbar spine. I explained that osteonecrosis is a serious condition that results from loss of blood supply to the bone, causing  the bone tissue to die. Discussed patient is at risk for vertebral body collapse, and if this occurred he would not be a candidate for surgery in his current condition, emphasizing that he is very weak and debilitated.   Discussed that we need to have a family meeting to discuss goal of care for patient. I called Harriett Sine back after she had a chance to find out when other family members are available.    Objective:  Physical Exam Vitals reviewed.  Constitutional:      General: He is not in acute distress.    Appearance: He is ill-appearing.  HENT:     Head:     Comments: ? Left facial droop  Pulmonary:     Effort: Pulmonary effort is normal.  Neurological:     Mental Status: He is lethargic.     Motor: Weakness present.             Vital Signs: BP (!) 139/58 (BP Location: Left Arm)   Pulse 100   Temp (!) 97.5 F (36.4 C) (Oral)   Resp 20   SpO2 94%  SpO2: SpO2: 94 % O2 Device: O2 Device: Room Air  Palliative Medicine Assessment & Plan   Assessment: Principal Problem:   Discitis of lumbar region Active Problems:   Diabetes mellitus type 2 in nonobese (HCC)   Dyslipidemia   Essential hypertension   New onset atrial fibrillation (HCC)   Sepsis (HCC)   BPH (benign prostatic hyperplasia)   Mood disorder (HCC)   MSSA bacteremia    Recommendations/Plan: Full code full scope for now Family meeting tomorrow at 3 pm  Prognosis: guarded   Care plan was discussed with Dr. Allena Katz  Thank you for allowing the Palliative Medicine Team to assist in the care of this patient.   MDM - High   Merry Proud, NP   Please contact Palliative Medicine Team phone at [phone] for questions and concerns.  For individual providers, please see AMION.

## 2022-12-27 NOTE — Progress Notes (Signed)
[facility]  Date of Admission:  12/24/2022           Reason for visit: Follow up on MSSA Bacteremia  Current antibiotics: Cefazolin   ASSESSMENT:    72 y.o. male admitted with:  MSSA bacteremia: Patient initially presenting to [facility] with several months of low back pain with CT imaging raising concern for possible discitis/osteomyelitis.  Follow-up MRI with and without contrast on 5/29 has revealed concern for L4 and L5 vertebral osteonecrosis with infarction of the L4 and L5 vertebral bodies rather than osteomyelitis/infection.  Initial blood cultures from OSH on 5/27 positive for MSSA.  Repeat blood cultures upon transfer to [facility] no growth to date from 5/28.  TTE done 5/29 negative for obvious vegetation, however, was a technically difficult study.  Unable to obtain TEE currently given overall condition per cardiology. L4 and L5 vertebral osteonecrosis: MRI 5/29 noted this finding with infarction of the L4 and L5 vertebral bodies.  Radiology felt that the findings were more consistent with osteonecrosis rather than osteomyelitis.  Unclear etiology, but possibly has received injections previously "somewhere in his spine" per TRH from discussion with patients brother.  Not sure if there is any relation at this time.  Neurosurgery notes no surgery indicated for this issue and feels that osteonecrosis is more likely than infection.  Ultimately, difficult to tell and certainly concerning for infection in setting of Staph bacteremia, but there is no compressive phlegmon or abscess requiring surgery right now.  He may need stabilization in the future but would need to improve and treat bacteremia prior to embarking upon this intervention. Type 2 diabetes: Hemoglobin A1c is 8.5. Thrombocytopenia: Likely in the setting of sepsis with platelets in October 2023 being normal. Elevated LFTs: Also likely in the setting of sepsis with negative CT imaging on  12/22/2022 at the outside hospital.  Hepatitis serologies are negative. Right wrist edema: Negative wrist x-ray.  MRI pending. Possible left eye ptosis, left facial droop: Noted this morning but then seemed improved soon after upon evaluation by Dr Allena Katz.   New onset Atrial fibrillation: Noted this admission.  Not on AC due to #2.  RECOMMENDATIONS:    Continue Cefazolin.  May need to change to Nafcillin if work up reveals CNS emboli Agree with MRI brain to r/o stroke in settign of MSSA bacteremia and new onset A fib not on AC MRI right wrist pending Likely to need TEE when more stable although tentatively planning for 6-8 weeks therapy under assumption that lumbar spine is seeded Lab monitoring Following.  I am here this weekend.  Dr Daiva Eves or Drue Second will take over Monday.   Principal Problem:   Discitis of lumbar region Active Problems:   Diabetes mellitus type 2 in nonobese (HCC)   Dyslipidemia   Essential hypertension   New onset atrial fibrillation (HCC)   Sepsis (HCC)   BPH (benign prostatic hyperplasia)   Mood disorder (HCC)   MSSA bacteremia    MEDICATIONS:    Scheduled Meds:  acetaminophen  500 mg Oral TID   azelastine  1 spray Each Nare BID   escitalopram  10 mg Oral Daily   gabapentin  100 mg Oral BID   insulin aspart  0-15 Units Subcutaneous TID WC   insulin aspart  0-5 Units Subcutaneous QHS   lidocaine  2 patch Transdermal Q24H   loratadine  10 mg Oral Daily   pantoprazole  40 mg Oral BID  rosuvastatin  20 mg Oral Daily   sodium chloride flush  3 mL Intravenous Q12H   tamsulosin  0.4 mg Oral Daily   Continuous Infusions:  amiodarone 30 mg/hr (12/26/22 1906)    ceFAZolin (ANCEF) IV 2 g (12/26/22 2201)   lactated ringers 75 mL/hr at 12/26/22 2158   PRN Meds:.acetaminophen **OR** acetaminophen, methocarbamol, metoprolol tartrate, morphine injection, ondansetron **OR** ondansetron (ZOFRAN) IV, oxyCODONE  SUBJECTIVE:   24 hour events:  No acute events  noted Palliative consulted WBC 16.1, slightly increased Platelets clumped Creatinine improved Repeat blood cx NGTD 5/28 CXR negative Wrist MRI pending Afebrile    Reports ongoing back pain and wrist pain.  Review of Systems  All other systems reviewed and are negative.     OBJECTIVE:   Blood pressure (!) 157/67, pulse 83, temperature 98.6 F (37 C), temperature source Oral, resp. rate 20, SpO2 94 %. There is no height or weight on file to calculate BMI.  Physical Exam Constitutional:      Appearance: He is ill-appearing.  HENT:     Head: Normocephalic and atraumatic.  Eyes:     General:        Left eye: Discharge present.    Extraocular Movements: Extraocular movements intact.  Cardiovascular:     Rate and Rhythm: Normal rate and regular rhythm.  Pulmonary:     Effort: Pulmonary effort is normal. No respiratory distress.  Abdominal:     General: There is no distension.     Palpations: Abdomen is soft.  Musculoskeletal:        General: Swelling and tenderness present.     Cervical back: Normal range of motion and neck supple.     Right lower leg: No edema.     Left lower leg: No edema.     Comments: Right wrist noted to swollen and tender.  Neurological:     General: No focal deficit present.     Mental Status: He is alert and oriented to person, place, and time.     Comments: Patient noted to have left eye ptosis this morning and injected conjunctiva.  ? Chronic. Also thought to have some left sided facial droop that seemed improved when Dr Allena Katz went to examine the patient. Possible slightly decreased strength on the left UE.  Psychiatric:        Mood and Affect: Mood normal.        Behavior: Behavior normal.      Lab Results: Lab Results  Component Value Date   WBC 16.1 (H) 12/27/2022   HGB 11.5 (L) 12/27/2022   HCT 34.4 (L) 12/27/2022   MCV 78.2 (L) 12/27/2022   PLT  12/27/2022    PLATELET CLUMPS NOTED ON SMEAR, COUNT APPEARS DECREASED    Lab  Results  Component Value Date   NA 138 12/27/2022   K 4.2 12/27/2022   CO2 26 12/27/2022   GLUCOSE 191 (H) 12/27/2022   BUN 33 (H) 12/27/2022   CREATININE 0.81 12/27/2022   CALCIUM 8.0 (L) 12/27/2022   GFRNONAA >60 12/27/2022   GFRAA  05/09/2010    >60        The eGFR has been calculated using the MDRD equation. This calculation has not been validated in all clinical situations. eGFR's persistently <60 mL/min signify possible Chronic Kidney Disease.    Lab Results  Component Value Date   ALT 41 12/25/2022   AST 110 (H) 12/25/2022   ALKPHOS 76 12/25/2022   BILITOT 1.8 (H) 12/25/2022  Component Value Date/Time   CRP 48.2 (H) 12/24/2022 1646       Component Value Date/Time   ESRSEDRATE 43 (H) 12/24/2022 1646     I have reviewed the micro and lab results in Epic.  Imaging: DG CHEST PORT 1 VIEW  Result Date: 12/26/2022 CLINICAL DATA:  Shortness of breath EXAM: PORTABLE CHEST 1 VIEW COMPARISON:  12/24/2022 FINDINGS: Cardiac shadow is stable. Elevation of left hemidiaphragm is again noted. No focal infiltrate or effusion is seen. Postsurgical changes in the cervical spine are noted. No acute bony abnormality is seen. IMPRESSION: No acute abnormality noted. Electronically Signed   By: Alcide Clever M.D.   On: 12/26/2022 15:35   DG Wrist Complete Right  Result Date: 12/25/2022 CLINICAL DATA:  Wrist pain and swelling for 1 week. No known injury. EXAM: RIGHT WRIST - COMPLETE 3+ VIEW COMPARISON:  None Available. FINDINGS: The mineralization and alignment are normal. There is no evidence of acute fracture or dislocation. There is mild joint space narrowing at the 1st carpometacarpal and scaphotrapeziotrapezoidal joints. There is also joint space narrowing at the 2nd and 3rd MCP joints. No erosive changes are identified. There is possible soft tissue swelling around the wrist without evidence of foreign body or soft tissue emphysema. IMPRESSION: 1. No acute osseous findings or  evidence of inflammatory arthropathy. Mild degenerative changes as described. 2. Possible soft tissue swelling around the wrist. Electronically Signed   By: Carey Bullocks M.D.   On: 12/25/2022 19:09   ECHOCARDIOGRAM COMPLETE  Result Date: 12/25/2022    ECHOCARDIOGRAM REPORT   Patient Name:   NALIN MUNDINE Date of Exam: 12/25/2022 Medical Rec #:  [id number]        Height:       72.0 in Accession #:    [id number]       Weight:       208.0 lb Date of Birth:  1951-06-15        BSA:          2.166 m Patient Age:    72 years         BP:           119/80 mmHg Patient Gender: M                HR:           89 bpm. Exam Location:  Inpatient Procedure: 2D Echo, Cardiac Doppler and Color Doppler Indications:    A-FIB  History:        Patient has no prior history of Echocardiogram examinations.                 Arrythmias:Atrial Fibrillation; Risk Factors:Dyslipidemia,                 Diabetes and Hypertension.  Sonographer:    Darlys Gales Referring Phys: [id number] JENNIFER YATES IMPRESSIONS  1. Left ventricular ejection fraction, by estimation, is 65 to 70%. The left ventricle has normal function. Left ventricular endocardial border not optimally defined to evaluate regional wall motion. Indeterminate diastolic filling due to E-A fusion.  2. Right ventricular systolic function is hyperdynamic. The right ventricular size is not well visualized. Tricuspid regurgitation signal is inadequate for assessing PA pressure.  3. The mitral valve was not well visualized. No evidence of mitral valve regurgitation.  4. The aortic valve was not well visualized. Aortic valve regurgitation is not visualized. No aortic stenosis is present.  5. The inferior vena cava is dilated in size with >  50% respiratory variability, suggesting right atrial pressure of 8 mmHg.  6. Technically difficult study. Comparison(s): No prior Echocardiogram. FINDINGS  Left Ventricle: Left ventricular ejection fraction, by estimation, is 65 to 70%. The left ventricle  has normal function. Left ventricular endocardial border not optimally defined to evaluate regional wall motion. The left ventricular internal cavity size was normal in size. There is no left ventricular hypertrophy. Indeterminate diastolic filling due to E-A fusion. Right Ventricle: The right ventricular size is not well visualized. Right vetricular wall thickness was not well visualized. Right ventricular systolic function is hyperdynamic. Tricuspid regurgitation signal is inadequate for assessing PA pressure. Left Atrium: Left atrial size was normal in size. Right Atrium: Right atrial size was normal in size. Pericardium: There is no evidence of pericardial effusion. Mitral Valve: The mitral valve was not well visualized. No evidence of mitral valve regurgitation. Tricuspid Valve: The tricuspid valve is normal in structure. Tricuspid valve regurgitation is not demonstrated. No evidence of tricuspid stenosis. Aortic Valve: The aortic valve was not well visualized. Aortic valve regurgitation is not visualized. No aortic stenosis is present. Aortic valve mean gradient measures 8.0 mmHg. Aortic valve peak gradient measures 10.8 mmHg. Aortic valve area, by VTI measures 2.33 cm. Pulmonic Valve: The pulmonic valve was not well visualized. Pulmonic valve regurgitation is not visualized. Aorta: The aortic root and ascending aorta are structurally normal, with no evidence of dilitation. Venous: The inferior vena cava is dilated in size with greater than 50% respiratory variability, suggesting right atrial pressure of 8 mmHg. IAS/Shunts: The interatrial septum was not well visualized.  LEFT VENTRICLE PLAX 2D LVIDd:         4.30 cm   Diastology LVIDs:         3.20 cm   LV e' medial:    8.70 cm/s LV PW:         0.90 cm   LV E/e' medial:  7.1 LV IVS:        1.00 cm   LV e' lateral:   12.10 cm/s LVOT diam:     2.00 cm   LV E/e' lateral: 5.1 LV SV:         63 LV SV Index:   29 LVOT Area:     3.14 cm  RIGHT VENTRICLE              IVC RV S prime:     17.40 cm/s  IVC diam: 2.30 cm TAPSE (M-mode): 2.9 cm LEFT ATRIUM           Index        RIGHT ATRIUM           Index LA Vol (A2C): 27.8 ml 12.83 ml/m  RA Area:     11.10 cm LA Vol (A4C): 52.7 ml 24.33 ml/m  RA Volume:   21.80 ml  10.06 ml/m  AORTIC VALVE AV Area (Vmax):    2.70 cm AV Area (Vmean):   2.12 cm AV Area (VTI):     2.33 cm AV Vmax:           164.00 cm/s AV Vmean:          133.000 cm/s AV VTI:            0.271 m AV Peak Grad:      10.8 mmHg AV Mean Grad:      8.0 mmHg LVOT Vmax:         141.00 cm/s LVOT Vmean:  89.700 cm/s LVOT VTI:          0.201 m LVOT/AV VTI ratio: 0.74  AORTA Ao Root diam: 3.10 cm Ao Asc diam:  3.20 cm MITRAL VALVE MV Area (PHT): 3.74 cm    SHUNTS MV Decel Time: 203 msec    Systemic VTI:  0.20 m MV E velocity: 61.80 cm/s  Systemic Diam: 2.00 cm MV A velocity: 89.10 cm/s MV E/A ratio:  0.69 Riley Lam MD Electronically signed by Riley Lam MD Signature Date/Time: 12/25/2022/4:21:59 PM    Final    MR Lumbar Spine W Wo Contrast  Addendum Date: 12/25/2022   ADDENDUM REPORT: 12/25/2022 11:53 ADDENDUM: This unusual case was discussed by telephone with Dr. Delma Officer, Neurosurgery on 12/25/2022 at 1139 hours. Electronically Signed   By: Odessa Fleming M.D.   On: 12/25/2022 11:53   Result Date: 12/25/2022 CLINICAL DATA:  72 year old male with back pain. New onset atrial fibrillation. Abnormal prevertebral soft tissue gas and inflammation on recent [facility] lumbar spine CT 12/23/2022. EXAM: MRI LUMBAR SPINE WITHOUT AND WITH CONTRAST TECHNIQUE: Multiplanar and multiecho pulse sequences of the lumbar spine were obtained without and with intravenous contrast. CONTRAST:  9mL GADAVIST GADOBUTROL 1 MMOL/ML IV SOLN COMPARISON:  Lumbar spine CT 12/23/2022 and earlier. FINDINGS: Segmentation:  Normal on the recent CT. Alignment: Stable, and with maintained vertebral body height at this time. Vertebrae: Marrow signal heterogeneity at  both the L4 and L5 vertebral bodies on precontrast imaging, with patchy T2 and STIR hyperintensity at both levels. But striking lack of enhancement of both vertebral bodies following contrast (series 8, image 11). The posterior elements of both levels are less affected although the hypoenhancement continues into the left L5 pedicle, and similar hypoenhancement of the right L4 pedicle. Small there are patchy areas of more preserved enhancement along the periphery of both vertebral bodies. No similar loss of bone enhancement elsewhere in the visible spine or sacrum. No other areas of marrow edema. Conus medullaris and cauda equina: Conus extends to the T12-L1 level. No lower spinal cord or conus signal abnormality. No abnormal intradural enhancement is identified. No definite dural thickening. Paraspinal and other soft tissues: Epidural and paravertebral soft tissues are abnormal at both L4 and L5, see below. Maintained visible major vascular flow voids in the abdomen. Grossly negative visible abdominal viscera. Diverticulosis of the large bowel at the pelvic inlet. Partially visible distended urinary bladder. Disc levels: Largely normal for age MRI appearance of the spine from T12 through L3. L4 and L5: Heterogeneous and thickened appearance of the ventral epidural spaces at both levels, seemingly with absent enhancement of the epidural venous plexus there. Mild to moderate surrounding paravertebral soft tissue inflammation including some muscle edema in the medial psoas and erector spinae muscles. Abnormal fluid signal in the L4-L5 disc space. But no paraspinal fluid collection. No organized or rim enhancing fluid. Multifactorial spinal stenosis which appears in large part due to the heterogeneously expanded ventral epidural space at both levels. No significant lumbar spinal stenosis here on the 2021 MRI despite chronic disc and endplate degeneration at those levels. IMPRESSION: 1. Highly unusual appearance of both  the L4 and L5 vertebral bodies with constellation of CT and postcontrast MRI findings most consistent with Acute Vertebral Osteonecrosis, infarction of the L4 and L5 vertebral bodies. No vertebral body collapse at this time. Posterior elements relatively spared (although right L4 and left L5 pedicles are affected). Abnormal ventral epidural space at both levels which may be in part  related to basivertebral venous thrombosis. 2. Subsequent surrounding soft tissue inflammation, abnormal signal in the intervening L4-L5 disc space, and multifactorial spinal stenosis at both levels. But the striking loss of vertebral body enhancement is consistent with osteonecrosis rather than osteomyelitis. 3. Other lumbar and sacral levels appear unaffected. And visible lower thoracic spinal cord, conus medullaris, and cauda equina nerve roots above L4 seem to remain normal. Electronically Signed: By: Odessa Fleming M.D. On: 12/25/2022 11:04     Imaging independently reviewed in Epic.    Vedia Coffer for Infectious Disease [facility] [phone] pager 12/27/2022, 5:13 AM  I have personally spent 50 minutes involved in face-to-face and non-face-to-face activities for this patient on the day of the visit. Professional time spent includes the following activities: Preparing to see the patient (review of tests), Obtaining and/or reviewing separately obtained history (admission/discharge record), Performing a medically appropriate examination and/or evaluation , Ordering medications/tests/procedures, referring and communicating with other health care professionals, Documenting clinical information in the EMR, Independently interpreting results (not separately reported), Communicating results to the patient/family/caregiver, Counseling and educating the patient/family/caregiver and Care coordination (not separately reported).

## 2022-12-27 NOTE — Progress Notes (Signed)
Providing Compassionate, Quality Care - Together   Subjective: Patient reports hip pain. He is resting, but awakens to voice.  Objective: Vital signs in last 24 hours: Temp:  [97.5 F (36.4 C)-99.4 F (37.4 C)] 97.5 F (36.4 C) (05/31 0809) Pulse Rate:  [80-92] 92 (05/31 0809) Resp:  [19-33] 20 (05/31 0809) BP: (116-164)/(60-110) 160/67 (05/31 0809) SpO2:  [90 %-100 %] 92 % (05/31 0809)  Intake/Output from previous day: 05/30 0701 - 05/31 0700 In: 1087.5 [P.O.:720; I.V.:267.5; IV Piggyback:100] Out: 2700 [Urine:2700] Intake/Output this shift: Total I/O In: -  Out: 1000 [Urine:1000]  Responds to voice, oriented x 4 PERRLA Speech clear MAE, Strength grossly intact    Lab Results: Recent Labs    12/26/22 0031 12/27/22 0013  WBC 15.4* 16.1*  HGB 11.4* 11.5*  HCT 34.8* 34.4*  PLT 63* PLATELET CLUMPS NOTED ON SMEAR, COUNT APPEARS DECREASED   BMET Recent Labs    12/26/22 0031 12/27/22 0013  NA 137 138  K 3.6 4.2  CL 103 102  CO2 21* 26  GLUCOSE 190* 191*  BUN 50* 33*  CREATININE 1.06 0.81  CALCIUM 8.1* 8.0*    Studies/Results: DG CHEST PORT 1 VIEW  Result Date: 12/26/2022 CLINICAL DATA:  Shortness of breath EXAM: PORTABLE CHEST 1 VIEW COMPARISON:  12/24/2022 FINDINGS: Cardiac shadow is stable. Elevation of left hemidiaphragm is again noted. No focal infiltrate or effusion is seen. Postsurgical changes in the cervical spine are noted. No acute bony abnormality is seen. IMPRESSION: No acute abnormality noted. Electronically Signed   By: Alcide Clever M.D.   On: 12/26/2022 15:35   DG Wrist Complete Right  Result Date: 12/25/2022 CLINICAL DATA:  Wrist pain and swelling for 1 week. No known injury. EXAM: RIGHT WRIST - COMPLETE 3+ VIEW COMPARISON:  None Available. FINDINGS: The mineralization and alignment are normal. There is no evidence of acute fracture or dislocation. There is mild joint space narrowing at the 1st carpometacarpal and  scaphotrapeziotrapezoidal joints. There is also joint space narrowing at the 2nd and 3rd MCP joints. No erosive changes are identified. There is possible soft tissue swelling around the wrist without evidence of foreign body or soft tissue emphysema. IMPRESSION: 1. No acute osseous findings or evidence of inflammatory arthropathy. Mild degenerative changes as described. 2. Possible soft tissue swelling around the wrist. Electronically Signed   By: Carey Bullocks M.D.   On: 12/25/2022 19:09   ECHOCARDIOGRAM COMPLETE  Result Date: 12/25/2022    ECHOCARDIOGRAM REPORT   Patient Name:   Jim Ramirez Date of Exam: 12/25/2022 Medical Rec #:  [id number]        Height:       72.0 in Accession #:    [id number]       Weight:       208.0 lb Date of Birth:  1950/10/08        BSA:          2.166 m Patient Age:    72 years         BP:           119/80 mmHg Patient Gender: M                HR:           89 bpm. Exam Location:  Inpatient Procedure: 2D Echo, Cardiac Doppler and Color Doppler Indications:    A-FIB  History:        Patient has no prior history of Echocardiogram examinations.  Arrythmias:Atrial Fibrillation; Risk Factors:Dyslipidemia,                 Diabetes and Hypertension.  Sonographer:    Darlys Gales Referring Phys: [id number] JENNIFER YATES IMPRESSIONS  1. Left ventricular ejection fraction, by estimation, is 65 to 70%. The left ventricle has normal function. Left ventricular endocardial border not optimally defined to evaluate regional wall motion. Indeterminate diastolic filling due to E-A fusion.  2. Right ventricular systolic function is hyperdynamic. The right ventricular size is not well visualized. Tricuspid regurgitation signal is inadequate for assessing PA pressure.  3. The mitral valve was not well visualized. No evidence of mitral valve regurgitation.  4. The aortic valve was not well visualized. Aortic valve regurgitation is not visualized. No aortic stenosis is present.  5. The  inferior vena cava is dilated in size with >50% respiratory variability, suggesting right atrial pressure of 8 mmHg.  6. Technically difficult study. Comparison(s): No prior Echocardiogram. FINDINGS  Left Ventricle: Left ventricular ejection fraction, by estimation, is 65 to 70%. The left ventricle has normal function. Left ventricular endocardial border not optimally defined to evaluate regional wall motion. The left ventricular internal cavity size was normal in size. There is no left ventricular hypertrophy. Indeterminate diastolic filling due to E-A fusion. Right Ventricle: The right ventricular size is not well visualized. Right vetricular wall thickness was not well visualized. Right ventricular systolic function is hyperdynamic. Tricuspid regurgitation signal is inadequate for assessing PA pressure. Left Atrium: Left atrial size was normal in size. Right Atrium: Right atrial size was normal in size. Pericardium: There is no evidence of pericardial effusion. Mitral Valve: The mitral valve was not well visualized. No evidence of mitral valve regurgitation. Tricuspid Valve: The tricuspid valve is normal in structure. Tricuspid valve regurgitation is not demonstrated. No evidence of tricuspid stenosis. Aortic Valve: The aortic valve was not well visualized. Aortic valve regurgitation is not visualized. No aortic stenosis is present. Aortic valve mean gradient measures 8.0 mmHg. Aortic valve peak gradient measures 10.8 mmHg. Aortic valve area, by VTI measures 2.33 cm. Pulmonic Valve: The pulmonic valve was not well visualized. Pulmonic valve regurgitation is not visualized. Aorta: The aortic root and ascending aorta are structurally normal, with no evidence of dilitation. Venous: The inferior vena cava is dilated in size with greater than 50% respiratory variability, suggesting right atrial pressure of 8 mmHg. IAS/Shunts: The interatrial septum was not well visualized.  LEFT VENTRICLE PLAX 2D LVIDd:         4.30  cm   Diastology LVIDs:         3.20 cm   LV e' medial:    8.70 cm/s LV PW:         0.90 cm   LV E/e' medial:  7.1 LV IVS:        1.00 cm   LV e' lateral:   12.10 cm/s LVOT diam:     2.00 cm   LV E/e' lateral: 5.1 LV SV:         63 LV SV Index:   29 LVOT Area:     3.14 cm  RIGHT VENTRICLE             IVC RV S prime:     17.40 cm/s  IVC diam: 2.30 cm TAPSE (M-mode): 2.9 cm LEFT ATRIUM           Index        RIGHT ATRIUM  Index LA Vol (A2C): 27.8 ml 12.83 ml/m  RA Area:     11.10 cm LA Vol (A4C): 52.7 ml 24.33 ml/m  RA Volume:   21.80 ml  10.06 ml/m  AORTIC VALVE AV Area (Vmax):    2.70 cm AV Area (Vmean):   2.12 cm AV Area (VTI):     2.33 cm AV Vmax:           164.00 cm/s AV Vmean:          133.000 cm/s AV VTI:            0.271 m AV Peak Grad:      10.8 mmHg AV Mean Grad:      8.0 mmHg LVOT Vmax:         141.00 cm/s LVOT Vmean:        89.700 cm/s LVOT VTI:          0.201 m LVOT/AV VTI ratio: 0.74  AORTA Ao Root diam: 3.10 cm Ao Asc diam:  3.20 cm MITRAL VALVE MV Area (PHT): 3.74 cm    SHUNTS MV Decel Time: 203 msec    Systemic VTI:  0.20 m MV E velocity: 61.80 cm/s  Systemic Diam: 2.00 cm MV A velocity: 89.10 cm/s MV E/A ratio:  0.69 Riley Lam MD Electronically signed by Riley Lam MD Signature Date/Time: 12/25/2022/4:21:59 PM    Final     Assessment/Plan: Patient with significant back pain with radiation down BLE. The patient is septic with elevated troponin and a-fib with RVR. MRI with evidence of infarction of the L4 and L5 vertebral bodies. This is not consistent with osteomyelitis of the lumbar spine. There is no role for surgery for this issue presently.   LOS: 3 days   -Continue abx for bacteremia -Continue supportive care -Mobilize as tolerated with LSO corset  Val Eagle, DNP, AGNP-C Nurse Practitioner  [facility] [address], [place], [place] [address] P: [phone]    F: [phone]  12/27/2022, 10:31  AM

## 2022-12-27 NOTE — Progress Notes (Signed)
This chaplain responded to PMT NP-Julia consult for creating/updating the Pt. Advance Directive. The chaplain understands from the chart notes the Pt. first choice for HCPOA is his sister-Nancy Bascom Levels. The Pt. choice for alternate HCPOA is his brother-Roy Montez Morita.  The chaplain was updated before the visit by the unit RN of the "Pt. MRI ordered to evaluate for possibility of stroke." This chaplain will plan to revisit at another time.  Chaplain Stephanie Acre [phone]

## 2022-12-27 NOTE — Progress Notes (Signed)
[facility] Progress Note Patient: Jim Ramirez:[id number] DOB: 1950/10/19 DOA: 12/24/2022  DOS: the patient was seen and examined on 12/27/2022  Brief hospital course: PMH of HTN and DM presenting with back pain.   He initially presented to [facility] on 5/26 with c/o chronic back pain with radiation down both legs.  He also noted SOB x 1 week.  Found to have Staph aureus bacteremia with osteonecrosis of the lumbar spine.  Also had A-fib with RVR.  Neurosurgery, ID, cardiology following. Assessment and Plan: Sepsis secondary to Staph aureus infection and bacteremia, osteomyelitis and lumbar spine discitis. Present on admission. SIRS criteria with leukocytosis tachycardia and tachypnea. Pt has Platelet count 59, Bilirubin 1.4, MAP 59 and S. Creatinine 1.23.  Troponin elevation. Suggesting SOFA score more than 2.  Blood culture at [facility] (2-Rh) growing MSSA. Repeat blood cultures ordered.  So far no growth. ID following. Initially was on IV vancomycin.  Now on cefazolin. Echo shows no evidence of vegetation.  Preserved EF.  TEE recommended once more stable. Neurosurgery was also consulted.  Currently thinking that spinal findings less likely infection.  Recommend no indication for surgical intervention currently. 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 Prior history of cervical fusion October 2023. Neurosurgery consulted.  Known to Dr. Lovell Sheehan. Appearance of the MRI per neurosurgery is consistent with vertebral body infarction or necrosis.  Less likely infection. Recommend conservative measures. Informed that there is a chance of vertebral body collapse. If this occurs he would need extensive reconstruction/stabilization.  Neurosurgery ordered lumbosacral corset.  Neurosurgery Recommend gabapentin. Monitor.  New onset A-fib. Cardiology consulted. Echocardiogram shows preserved EF 65 to 70% without any wall motion abnormality.  Hyperdynamic RV.  No valvular  abnormality. Currently on amiodarone drip. Transition to PO closer to dispo: amiodarone 400 mg BID for 10 days then 200 mg daily.  Not a good candidate  for anticoagulation.   Acute thrombocytopenia. Baseline platelet count normal. Platelet count 63 currently.  Likely in the setting of marrow suppression due to sepsis and infection.  Avoid chemical DVT prophylaxis.  Cardiology currently recommending to hold off on anticoagulation as well as TEE. Once the platelets are stable cardiology recommend to consider anticoagulation though.   Right wrist edema. No fall no trauma no injury reported.  X-ray shows soft tissue swelling.  Will get MRI.   AKI. Baseline serum creatinine around 0.7. On admission serum creatinine 1.23. improved with IV hydration Monitor.   Elevated LFT and as well as hyperbilirubinemia. AST 90s ALT normal.  Bilirubin 1.9 on admission. Now better.    Elevated troponin. Likely demand ischemia. Not a candidate for anticoagulation.   Dyspnea. CTA chest with contrast on 12/23/2022 at outside facility negative for PE.  No pneumonia or pleural effusion or pneumothorax. Chest x-ray on 5/30 negative for pneumonia or vascular congestion. Monitor and continue supportive care.   BPH. Continue Flomax.   Type 2 diabetes mellitus, uncontrolled with hyperglycemia with long-term insulin use. Hemoglobin A1c 8.1.  On sliding scale insulin. Monitor.   HLD. On statin.   Mood disorder. On Lexapro.   OSA. Follows up with pulmonary outpatient. Continue CPAP nightly.  Left eye ptosis Chronic per pt. Has been present for a while pt. He reports it is due to cataract surgery.  CT head on 5/27 negative for stroke.   Generalized weakness and lethargy  Check MRI brain. Less likely stroke.  PTOT seeing the pt.  Speech was consulted due to his lethargy recommended  to stay NPO and FEES after result of the MRI is avaiable.   Subjective: ID called me to evaluate the pt as there was  some concern for left eye ptosis and left sided facial droop. That seems to have improved since. No chest pain. Pt with more lethargy this morning. Denies any shortness of breath but appears visibly shortness of breath. No chest pain or abdominal pain. Has back pain. Speech clear  Physical Exam: General: in moderate distress, No Rash Cardiovascular: S1 and S2 Present, No Murmur Respiratory: increased respiratory effort, Bilateral Air entry present. No Crackles, No wheezes Abdomen: Bowel Sound present, No tenderness Extremities: No edema in legs. Edema in bilateral upper extremity.  Neuro: Alert and oriented x3, no new focal deficit Left eye ptosis. Left sided past pointing.   Data Reviewed: I have Reviewed nursing notes, Vitals, and Lab results. Since last encounter, pertinent lab results CBC bmp   . I have ordered test including CBC BMP  . I have discussed pt's care plan and test results with ID  . I have ordered imaging MRI BRAIN and MRI wrist  .   Disposition: Status is: Inpatient Remains inpatient appropriate because: need further Antibiotics and work up.   SCDs Start: 12/24/22 1740   Family Communication: no one at bedside Level of care: Telemetry Cardiac   Vitals:   12/26/22 2305 12/27/22 0258 12/27/22 0809 12/27/22 1141  BP: (!) 141/64 (!) 157/67 (!) 160/67 (!) 139/58  Pulse: 80 83 92 100  Resp: 20 20 20 20   Temp: 97.7 F (36.5 C) 98.6 F (37 C) (!) 97.5 F (36.4 C)   TempSrc: Oral Oral Oral   SpO2: 96% 94% 92% 94%     Author: Lynden Oxford, MD 12/27/2022 3:23 PM  Please look on [email or web address] to find out who is on call.

## 2022-12-28 ENCOUNTER — Inpatient Hospital Stay (HOSPITAL_COMMUNITY): Payer: Medicare HMO

## 2022-12-28 ENCOUNTER — Other Ambulatory Visit: Payer: Self-pay | Admitting: Orthopedic Surgery

## 2022-12-28 DIAGNOSIS — R609 Edema, unspecified: Secondary | ICD-10-CM

## 2022-12-28 DIAGNOSIS — I4891 Unspecified atrial fibrillation: Secondary | ICD-10-CM | POA: Diagnosis not present

## 2022-12-28 DIAGNOSIS — R7881 Bacteremia: Secondary | ICD-10-CM | POA: Diagnosis not present

## 2022-12-28 DIAGNOSIS — Z515 Encounter for palliative care: Secondary | ICD-10-CM | POA: Diagnosis not present

## 2022-12-28 DIAGNOSIS — M4646 Discitis, unspecified, lumbar region: Secondary | ICD-10-CM | POA: Diagnosis not present

## 2022-12-28 LAB — CBC
HCT: 38.3 % — ABNORMAL LOW (ref 39.0–52.0)
Hemoglobin: 12.3 g/dL — ABNORMAL LOW (ref 13.0–17.0)
MCH: 25.2 pg — ABNORMAL LOW (ref 26.0–34.0)
MCHC: 32.1 g/dL (ref 30.0–36.0)
MCV: 78.5 fL — ABNORMAL LOW (ref 80.0–100.0)
Platelets: 85 10*3/uL — ABNORMAL LOW (ref 150–400)
RBC: 4.88 MIL/uL (ref 4.22–5.81)
RDW: 15.8 % — ABNORMAL HIGH (ref 11.5–15.5)
WBC: 13.2 10*3/uL — ABNORMAL HIGH (ref 4.0–10.5)
nRBC: 0.2 % (ref 0.0–0.2)

## 2022-12-28 LAB — BASIC METABOLIC PANEL
Anion gap: 14 (ref 5–15)
BUN: 29 mg/dL — ABNORMAL HIGH (ref 8–23)
CO2: 23 mmol/L (ref 22–32)
Calcium: 8.1 mg/dL — ABNORMAL LOW (ref 8.9–10.3)
Chloride: 106 mmol/L (ref 98–111)
Creatinine, Ser: 0.8 mg/dL (ref 0.61–1.24)
GFR, Estimated: >60 mL/min (ref >60)
Glucose, Bld: 184 mg/dL — ABNORMAL HIGH (ref 70–99)
Potassium: 3.8 mmol/L (ref 3.5–5.1)
Sodium: 143 mmol/L (ref 135–145)

## 2022-12-28 LAB — MAGNESIUM: Magnesium: 2 mg/dL (ref 1.7–2.4)

## 2022-12-28 LAB — GLUCOSE, CAPILLARY
Glucose-Capillary: 193 mg/dL — ABNORMAL HIGH (ref 70–99)
Glucose-Capillary: 211 mg/dL — ABNORMAL HIGH (ref 70–99)
Glucose-Capillary: 376 mg/dL — ABNORMAL HIGH (ref 70–99)
Glucose-Capillary: 281 mg/dL — ABNORMAL HIGH (ref 70–99)

## 2022-12-28 MED ORDER — SODIUM CHLORIDE 0.9 % IV SOLN
12.0000 g | INTRAVENOUS | Status: AC
  Administered 2022-12-28 – 2023-01-10 (×14): 12 g via INTRAVENOUS
  Filled 2022-12-28 (×12): qty 48
  Filled 2022-12-28: qty 40
  Filled 2022-12-28: qty 48

## 2022-12-28 MED ORDER — DEXTROSE 50 % IV SOLN
INTRAVENOUS | Status: AC
  Filled 2022-12-28: qty 50

## 2022-12-28 MED ORDER — SODIUM CHLORIDE 0.9 % IV SOLN
2.0000 g | INTRAVENOUS | Status: DC
  Filled 2022-12-28 (×4): qty 8

## 2022-12-28 MED ORDER — SODIUM CHLORIDE 0.9% FLUSH
10.0000 mL | Freq: Two times a day (BID) | INTRAVENOUS | Status: DC
  Administered 2022-12-28 – 2023-01-09 (×24): 10 mL
  Administered 2023-01-09: 20 mL
  Administered 2023-01-10 – 2023-01-15 (×10): 10 mL
  Administered 2023-01-15: 20 mL
  Administered 2023-01-16 – 2023-01-25 (×17): 10 mL

## 2022-12-28 MED ORDER — CHLORHEXIDINE GLUCONATE CLOTH 2 % EX PADS
6.0000 | MEDICATED_PAD | Freq: Every day | CUTANEOUS | Status: DC
  Administered 2022-12-28 – 2023-01-28 (×32): 6 via TOPICAL

## 2022-12-28 MED ORDER — SODIUM CHLORIDE 0.9% FLUSH: 10.0000 mL | INTRAVENOUS | Status: DC | PRN

## 2022-12-28 NOTE — Consult Note (Signed)
Orthopedic Hand Surgery Consultation:  Reason for Consult: Right wrist swelling Referring Physician: Dr. Allena Katz   HPI: Jim Ramirez is a(an) 72 y.o. male with right wrist swelling, concern for possible infection. He is admitted for MSSA bacteremia and osteonecrosis of lumbar spine. He was noted to have right wrist swelling several days ago. No open wounds. Had MRI done of the wrist. On abx per ID.    Physical Exam: Right Upper Extremity No open wounds, diffuse swelling throughout the upper extremity similar to other extremities, no erythema, non tender to palpation, freely moves the wrist and digits without pain, sensation intact to light touch, hand warm and well perfused, compartments soft   Assessment/Plan: MRI reviewed - fluid noted Low suspicion for infection of the upper extremity Suspect edema, noted in all extremities  Recommend range of motion Will follow along    Payton Mccallum PA-C Hand Surgery  [facility] Office number: [phone] [address], [address] [place], [place] [address]    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   CTA negative for aneurysm   Depressed    Diabetes mellitus without complication (HCC)    DVT (deep venous thrombosis) (HCC)    History of kidney stones    Hypertension    Nephrolithiasis    Sleep apnea     Past Surgical History:  Procedure Laterality Date   ANTERIOR CERVICAL DECOMP/DISCECTOMY FUSION N/A 05/22/2022   Procedure: ANTERIOR CERVICAL DISCECTOMY FUSION, INTERBODY PROSTHESIS,PLATE/SCREWS CERVICAL THREE-FOUR, CERVICAL FOUR-FIVE;REMOVAL CERVICAL PLATE;  Surgeon: Tressie Stalker, MD;  Location: [facility] OR;  Service: Neurosurgery;  Laterality: N/A;  3C   CATARACT EXTRACTION W/PHACO Left 09/23/2022   Procedure: CATARACT EXTRACTION PHACO AND INTRAOCULAR LENS PLACEMENT (IOC);  Surgeon: Fabio Pierce, MD;  Location: [facility];  Service: Ophthalmology;  Laterality:  Left;  CDE: 7.52   CATARACT EXTRACTION W/PHACO Right 10/07/2022   Procedure: CATARACT EXTRACTION PHACO AND INTRAOCULAR LENS PLACEMENT (IOC);  Surgeon: Fabio Pierce, MD;  Location: [facility];  Service: Ophthalmology;  Laterality: Right;  CDE: 9.40   STOMACH SURGERY     TONSILLECTOMY  2000    Family History  Problem Relation Age of Onset   Heart attack Mother    Heart attack Father    Hypertension Brother    Diabetes Brother    Coronary artery disease Brother     Social History:  reports that he quit smoking about 30 years ago. His smoking use included cigarettes. He has never been exposed to tobacco smoke. He has never used smokeless tobacco. He reports that he does not currently use alcohol. He reports that he does not use drugs.  Allergies: No Known Allergies  Medications: reviewed, no changes to patient's home medications  Results for orders placed or performed during the hospital encounter of 12/24/22 (from the past 48 hour(s))  Glucose, capillary     Status: Abnormal   Collection Time: 12/26/22  9:39 PM  Result Value Ref Range   Glucose-Capillary 164 (H) 70 - 99 mg/dL    Comment: Glucose reference range applies only to samples taken after fasting for at least 8 hours.   Comment 1 Notify RN    Comment 2 Document in Chart   Basic metabolic panel     Status: Abnormal   Collection Time: 12/27/22 12:13 AM  Result Value Ref Range   Sodium 138 135 - 145 mmol/L   Potassium 4.2 3.5 - 5.1 mmol/L    Comment: HEMOLYSIS AT THIS LEVEL MAY  AFFECT RESULT   Chloride 102 98 - 111 mmol/L   CO2 26 22 - 32 mmol/L   Glucose, Bld 191 (H) 70 - 99 mg/dL    Comment: Glucose reference range applies only to samples taken after fasting for at least 8 hours.   BUN 33 (H) 8 - 23 mg/dL   Creatinine, Ser 1.61 0.61 - 1.24 mg/dL   Calcium 8.0 (L) 8.9 - 10.3 mg/dL   GFR, Estimated >09 >60 mL/min    Comment: (NOTE) Calculated using the CKD-EPI Creatinine Equation (2021)    Anion gap 10 5 - 15     Comment: Performed at [facility], [address] [place], [place] [address]  CBC     Status: Abnormal   Collection Time: 12/27/22 12:13 AM  Result Value Ref Range   WBC 16.1 (H) 4.0 - 10.5 K/uL   RBC 4.40 4.22 - 5.81 MIL/uL   Hemoglobin 11.5 (L) 13.0 - 17.0 g/dL   HCT 81.1 (L) 91.4 - 78.2 %   MCV 78.2 (L) 80.0 - 100.0 fL   MCH 26.1 26.0 - 34.0 pg   MCHC 33.4 30.0 - 36.0 g/dL   RDW 95.6 21.3 - 08.6 %   Platelets  150 - 400 K/uL    PLATELET CLUMPS NOTED ON SMEAR, COUNT APPEARS DECREASED    Comment: Immature Platelet Fraction may be clinically indicated, consider ordering this additional test [id number]    nRBC 0.0 0.0 - 0.2 %    Comment: Performed at [facility], [address] [place], [place] [address]  Magnesium     Status: None   Collection Time: 12/27/22 12:13 AM  Result Value Ref Range   Magnesium 2.4 1.7 - 2.4 mg/dL    Comment: Performed at [facility], [address], [place], [place] [address]  Hepatic function panel     Status: Abnormal   Collection Time: 12/27/22 12:13 AM  Result Value Ref Range   Total Protein 4.6 (L) 6.5 - 8.1 g/dL   Albumin <0.1 (L) 3.5 - 5.0 g/dL   AST 93 (H) 15 - 41 U/L    Comment: HEMOLYSIS AT THIS LEVEL MAY AFFECT RESULT   ALT 35 0 - 44 U/L    Comment: HEMOLYSIS AT THIS LEVEL MAY AFFECT RESULT   Alkaline Phosphatase 118 38 - 126 U/L   Total Bilirubin 1.4 (H) 0.3 - 1.2 mg/dL    Comment: HEMOLYSIS AT THIS LEVEL MAY AFFECT RESULT   Bilirubin, Direct 0.3 (H) 0.0 - 0.2 mg/dL   Indirect Bilirubin 1.1 (H) 0.3 - 0.9 mg/dL    Comment: Performed at [facility], [address] [place], [place] [address]  Glucose, capillary     Status: Abnormal   Collection Time: 12/27/22  6:13 AM  Result Value Ref Range   Glucose-Capillary 167 (H) 70 - 99 mg/dL    Comment: Glucose reference range applies only to samples taken after fasting for at least 8 hours.   Comment 1 Notify RN    Comment 2 Document in Chart   Glucose, capillary      Status: Abnormal   Collection Time: 12/27/22 11:38 AM  Result Value Ref Range   Glucose-Capillary 141 (H) 70 - 99 mg/dL    Comment: Glucose reference range applies only to samples taken after fasting for at least 8 hours.  Glucose, capillary     Status: Abnormal   Collection Time: 12/27/22  4:38 PM  Result Value Ref Range  Glucose-Capillary 206 (H) 70 - 99 mg/dL    Comment: Glucose reference range applies only to samples taken after fasting for at least 8 hours.  Glucose, capillary     Status: Abnormal   Collection Time: 12/27/22  9:21 PM  Result Value Ref Range   Glucose-Capillary 166 (H) 70 - 99 mg/dL    Comment: Glucose reference range applies only to samples taken after fasting for at least 8 hours.   Comment 1 Document in Chart   Basic metabolic panel     Status: Abnormal   Collection Time: 12/28/22 12:28 AM  Result Value Ref Range   Sodium 143 135 - 145 mmol/L   Potassium 3.8 3.5 - 5.1 mmol/L   Chloride 106 98 - 111 mmol/L   CO2 23 22 - 32 mmol/L   Glucose, Bld 184 (H) 70 - 99 mg/dL    Comment: Glucose reference range applies only to samples taken after fasting for at least 8 hours.   BUN 29 (H) 8 - 23 mg/dL   Creatinine, Ser 1.61 0.61 - 1.24 mg/dL   Calcium 8.1 (L) 8.9 - 10.3 mg/dL   GFR, Estimated >09 >60 mL/min    Comment: (NOTE) Calculated using the CKD-EPI Creatinine Equation (2021)    Anion gap 14 5 - 15    Comment: Performed at [facility], [address], [place], [place] [address]  CBC     Status: Abnormal   Collection Time: 12/28/22 12:28 AM  Result Value Ref Range   WBC 13.2 (H) 4.0 - 10.5 K/uL   RBC 4.88 4.22 - 5.81 MIL/uL   Hemoglobin 12.3 (L) 13.0 - 17.0 g/dL   HCT 81.1 (L) 91.4 - 78.2 %   MCV 78.5 (L) 80.0 - 100.0 fL   MCH 25.2 (L) 26.0 - 34.0 pg   MCHC 32.1 30.0 - 36.0 g/dL   RDW 95.6 (H) 21.3 - 08.6 %   Platelets 85 (L) 150 - 400 K/uL    Comment: Immature Platelet Fraction may be clinically indicated, consider ordering this additional  test [id number] REPEATED TO VERIFY PLATELET COUNT CONFIRMED BY SMEAR    nRBC 0.2 0.0 - 0.2 %    Comment: Performed at [facility], [address] [place], [place] [address]  Magnesium     Status: None   Collection Time: 12/28/22 12:28 AM  Result Value Ref Range   Magnesium 2.0 1.7 - 2.4 mg/dL    Comment: Performed at [facility], [address], [place], [place] [address]  Glucose, capillary     Status: Abnormal   Collection Time: 12/28/22  6:19 AM  Result Value Ref Range   Glucose-Capillary 193 (H) 70 - 99 mg/dL    Comment: Glucose reference range applies only to samples taken after fasting for at least 8 hours.   Comment 1 Notify RN    Comment 2 Document in Chart   Glucose, capillary     Status: Abnormal   Collection Time: 12/28/22 12:26 PM  Result Value Ref Range   Glucose-Capillary 211 (H) 70 - 99 mg/dL    Comment: Glucose reference range applies only to samples taken after fasting for at least 8 hours.    VAS [place] LOWER EXTREMITY VENOUS (DVT)  Result Date: 12/28/2022  Lower Venous DVT Study Patient Name:  Jim Ramirez  Date of Exam:   12/28/2022 Medical Rec #: [id number]         Accession #:    [id number] Date of  Birth: 05-09-1951         Patient Gender: M Patient Age:   26 years Exam Location:  [facility] Procedure:      VAS [place] LOWER EXTREMITY VENOUS (DVT) Referring Phys: PRANAV PATEL --------------------------------------------------------------------------------  Indications: Edema.  Risk Factors: None identified. Limitations: Poor ultrasound/tissue interface and patient positioning. Comparison Study: No prior studies. Performing Technologist: Chanda Busing RVT  Examination Guidelines: A complete evaluation includes B-mode imaging, spectral Doppler, color Doppler, and power Doppler as needed of all accessible portions of each vessel. Bilateral testing is considered an integral part of a complete examination. Limited examinations for reoccurring indications may  be performed as noted. The reflux portion of the exam is performed with the patient in reverse Trendelenburg.  +---------+---------------+---------+-----------+----------+--------------+ RIGHT    CompressibilityPhasicitySpontaneityPropertiesThrombus Aging +---------+---------------+---------+-----------+----------+--------------+ CFV      Full           Yes      Yes                                 +---------+---------------+---------+-----------+----------+--------------+ SFJ      Full                                                        +---------+---------------+---------+-----------+----------+--------------+ FV Prox  Full                                                        +---------+---------------+---------+-----------+----------+--------------+ FV Mid   Full                                                        +---------+---------------+---------+-----------+----------+--------------+ FV DistalFull                                                        +---------+---------------+---------+-----------+----------+--------------+ PFV      Full                                                        +---------+---------------+---------+-----------+----------+--------------+ POP      Full           Yes      Yes                                 +---------+---------------+---------+-----------+----------+--------------+ PTV      Full                                                        +---------+---------------+---------+-----------+----------+--------------+  PERO     Full                                                        +---------+---------------+---------+-----------+----------+--------------+   +---------+---------------+---------+-----------+----------+-------------------+ LEFT     CompressibilityPhasicitySpontaneityPropertiesThrombus Aging       +---------+---------------+---------+-----------+----------+-------------------+ CFV      Full           Yes      Yes                                      +---------+---------------+---------+-----------+----------+-------------------+ SFJ      Full                                                             +---------+---------------+---------+-----------+----------+-------------------+ FV Prox  Full                                                             +---------+---------------+---------+-----------+----------+-------------------+ FV Mid   Full                                                             +---------+---------------+---------+-----------+----------+-------------------+ FV DistalFull                                                             +---------+---------------+---------+-----------+----------+-------------------+ PFV      Full                                                             +---------+---------------+---------+-----------+----------+-------------------+ POP      Full           Yes      Yes                                      +---------+---------------+---------+-----------+----------+-------------------+ PTV      Full                                                             +---------+---------------+---------+-----------+----------+-------------------+  PERO                                                  Not well visualized +---------+---------------+---------+-----------+----------+-------------------+     Summary: RIGHT: - There is no evidence of deep vein thrombosis in the lower extremity.  - No cystic structure found in the popliteal fossa.  LEFT: - There is no evidence of deep vein thrombosis in the lower extremity. However, portions of this examination were limited- see technologist comments above.  - No cystic structure found in the popliteal fossa.  *See table(s) above for measurements and  observations. Electronically signed by Sherald Hess MD on 12/28/2022 at 9:39:16 AM.    Final    VAS [place] UPPER EXTREMITY VENOUS DUPLEX  Result Date: 12/28/2022 UPPER VENOUS STUDY  Patient Name:  Jim Ramirez  Date of Exam:   12/28/2022 Medical Rec #: [id number]         Accession #:    [id number] Date of Birth: 06-08-1951         Patient Gender: M Patient Age:   36 years Exam Location:  [facility] Procedure:      VAS [place] UPPER EXTREMITY VENOUS DUPLEX Referring Phys: PRANAV PATEL --------------------------------------------------------------------------------  Indications: Edema Risk Factors: None identified. Limitations: Poor ultrasound/tissue interface, bandages and line. Comparison Study: No prior studies. Performing Technologist: Chanda Busing RVT  Examination Guidelines: A complete evaluation includes B-mode imaging, spectral Doppler, color Doppler, and power Doppler as needed of all accessible portions of each vessel. Bilateral testing is considered an integral part of a complete examination. Limited examinations for reoccurring indications may be performed as noted.  Right Findings: +----------+------------+---------+-----------+----------+-------+ RIGHT     CompressiblePhasicitySpontaneousPropertiesSummary +----------+------------+---------+-----------+----------+-------+ IJV           Full       Yes       Yes                      +----------+------------+---------+-----------+----------+-------+ Subclavian    Full       Yes       Yes                      +----------+------------+---------+-----------+----------+-------+ Axillary      Full       Yes       Yes                      +----------+------------+---------+-----------+----------+-------+ Brachial      Full                                          +----------+------------+---------+-----------+----------+-------+ Radial        Full                                           +----------+------------+---------+-----------+----------+-------+ Ulnar         Full                                          +----------+------------+---------+-----------+----------+-------+  Cephalic      Full                                          +----------+------------+---------+-----------+----------+-------+ Basilic     Partial                                  Acute  +----------+------------+---------+-----------+----------+-------+  Left Findings: +----------+------------+---------+-----------+----------+-------+ LEFT      CompressiblePhasicitySpontaneousPropertiesSummary +----------+------------+---------+-----------+----------+-------+ Subclavian    Full       Yes       Yes                      +----------+------------+---------+-----------+----------+-------+  Summary:  Right: No evidence of deep vein thrombosis in the upper extremity. Findings consistent with acute superficial vein thrombosis involving the right basilic vein.  Left: No evidence of thrombosis in the subclavian.  *See table(s) above for measurements and observations.  Diagnosing physician: Sherald Hess MD Electronically signed by Sherald Hess MD on 12/28/2022 at 9:38:34 AM.    Final    MR WRIST RIGHT WO CONTRAST  Result Date: 12/28/2022 CLINICAL DATA:  Soft tissue infection suspected, wrist, xray done EXAM: MR OF THE RIGHT WRIST WITHOUT CONTRAST TECHNIQUE: Multiplanar, multisequence MR imaging of the right wrist was performed. No intravenous contrast was administered. COMPARISON:  X-ray 12/25/2022 FINDINGS: Technical Note: Despite efforts by the technologist and patient, motion artifact is present on today's exam and could not be eliminated. This reduces exam sensitivity and specificity. Bones/Joint/Cartilage No acute fracture. No dislocation. Small-moderate-sized radiocarpal and midcarpal joint effusions. No erosion. No periostitis. Mild osteoarthritis, most notably at the first [facility], first  MCP, and radiocarpal joints. Reactive subchondral marrow signal changes at the scapholunate articulation. No bone marrow edema marrow replacement. No bone lesion. Ligaments Scapholunate ligament appears degenerated, evaluation limited in the absence of intra-articular contrast. Lunotriquetral ligament and TFCC appear grossly intact. Muscles and Tendons Generalized intramuscular edema of the imaged musculature of the hand and distal forearm. Intact flexor and extensor tendons. Extensor carpi ulnaris tendinosis. No definite tenosynovial fluid collections. Soft tissues Lobulated fluid collection at the edge of the field of view best seen on coronal sequences measuring approximately 3.0 x 1.4 x 2.2 cm (series 8, image 9; series 10, image 21). Collection is located within the volar soft tissues of the hand at the level of the second and third metacarpal diaphyses, just deep to the traversing flexor tendons. It is unclear if this collection is associated with the traversing tendon sheaths or represents a intramuscular collection associated with the adductor pollicis muscle. Generalized edema throughout the superficial and deep soft tissues. No additional fluid collections are seen. IMPRESSION: 1. Lobulated fluid collection at the edge of the field of view measuring approximately 3.0 x 1.4 x 2.2 cm. It is unclear if this collection is associated with the traversing tendon sheaths or represents a intramuscular collection associated with the adductor pollicis muscle. In the setting of infection, appearance is most suggestive of an abscess or focal tenosynovitis. 2. Small-moderate-sized radiocarpal and midcarpal joint effusions. Findings are nonspecific and could be reactive or reflect septic arthritis. 3. Generalized intramuscular edema compatible with myositis. 4. No acute osseous abnormality. No evidence to suggest osteomyelitis. 5. Extensor carpi ulnaris tendinosis. Electronically Signed   By: Duanne Guess D.O.   On:  12/28/2022 09:15  MR BRAIN W WO CONTRAST  Result Date: 12/28/2022 CLINICAL DATA:  Stroke suspected EXAM: MRI HEAD WITHOUT AND WITH CONTRAST TECHNIQUE: Multiplanar, multiecho pulse sequences of the brain and surrounding structures were obtained without and with intravenous contrast. CONTRAST:  9mL GADAVIST GADOBUTROL 1 MMOL/ML IV SOLN COMPARISON:  No prior MRI available, correlation is made with 12/23/2022 CT head FINDINGS: Brain: No restricted diffusion with ADC correlate to suggest acute or subacute infarct. Small foci of restricted diffusion with ADC correlate are noted in the bilateral occipital horns (series 2, image 25). No abnormal parenchymal or meningeal enhancement. No abnormal enhancement of the ependymal lining. Minimal periventricular T2 hyperintense signal (series 6, image 14, 17, 19). No acute hemorrhage, mass, mass effect, or midline shift. No hydrocephalus or extra-axial collection. Normal pituitary and craniocervical junction. No hemosiderin deposition to suggest remote hemorrhage. Normal cerebral volume for age. Vascular: Normal arterial flow voids. Normal arterial and venous enhancement. Skull and upper cervical spine: Normal marrow signal. Sinuses/Orbits: Evaluation of the paranasal sinuses is somewhat limited by susceptibility artifact from the patient's dental hardware. Within this limitation, mild mucosal thickening in the maxillary sinuses and ethmoid air cells. No acute finding in the orbits. Status post bilateral lens replacements. Other: The mastoid air cells are well aerated. IMPRESSION: 1. Small foci of restricted diffusion with ADC correlate in the bilateral occipital horns, concerning for pyogenic ventriculitis. No appended mole enhancement and only minimal periventricular T2 hyperintense signal, which could also represent sequela of chronic small vessel ischemic disease. 2. No evidence of acute or subacute infarct. No abnormal parenchymal enhancement. These results will be called  to the ordering clinician or representative by the Radiologist Assistant, and communication documented in the PACS or [employer]. Electronically Signed   By: Wiliam Ke M.D.   On: 12/28/2022 01:40   [place] EKG SITE RITE  Result Date: 12/27/2022 If Site Rite image not attached, placement could not be confirmed due to current cardiac rhythm.   ROS: 14 point review of systems negative except per HPI

## 2022-12-28 NOTE — Progress Notes (Signed)
Right upper extremity venous duplex and bilateral lower extremity venous duplex has been completed. Preliminary results can be found in CV Proc through chart review.  Results were given to the patient's nurse, Amina.  12/28/22 9:18 AM Olen Cordial RVT

## 2022-12-28 NOTE — Procedures (Signed)
Objective Swallowing Evaluation: Type of Study: FEES-Fiberoptic Endoscopic Evaluation of Swallow   Patient Details  Name: Jim Ramirez MRN: [id number] Date of Birth: 26-Oct-1950  Today's Date: 12/28/2022 Time: SLP Start Time (ACUTE ONLY): 4098 -SLP Stop Time (ACUTE ONLY): 0955  SLP Time Calculation (min) (ACUTE ONLY): 27 min   Past Medical History:  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   CTA negative for aneurysm   Depressed    Diabetes mellitus without complication (HCC)    DVT (deep venous thrombosis) (HCC)    History of kidney stones    Hypertension    Nephrolithiasis    Sleep apnea    Past Surgical History:  Past Surgical History:  Procedure Laterality Date   ANTERIOR CERVICAL DECOMP/DISCECTOMY FUSION N/A 05/22/2022   Procedure: ANTERIOR CERVICAL DISCECTOMY FUSION, INTERBODY PROSTHESIS,PLATE/SCREWS CERVICAL THREE-FOUR, CERVICAL FOUR-FIVE;REMOVAL CERVICAL PLATE;  Surgeon: Tressie Stalker, MD;  Location: [facility] OR;  Service: Neurosurgery;  Laterality: N/A;  3C   CATARACT EXTRACTION W/PHACO Left 09/23/2022   Procedure: CATARACT EXTRACTION PHACO AND INTRAOCULAR LENS PLACEMENT (IOC);  Surgeon: Fabio Pierce, MD;  Location: [facility] ORS;  Service: Ophthalmology;  Laterality: Left;  CDE: 7.52   CATARACT EXTRACTION W/PHACO Right 10/07/2022   Procedure: CATARACT EXTRACTION PHACO AND INTRAOCULAR LENS PLACEMENT (IOC);  Surgeon: Fabio Pierce, MD;  Location: [facility] ORS;  Service: Ophthalmology;  Laterality: Right;  CDE: 9.40   STOMACH SURGERY     TONSILLECTOMY  2000   HPI: Jim Ramirez is a 72 yo male who initially presented to [facility] on 5/26 with c/o chronic back pain with radiation down both legs.  He also noted SOB x 1 week.  Found to have Staph aureus bacteremia with osteonecrosis of the lumbar spine.  Also had A-fib with RVR.  Neurosurgery, ID, cardiology following. CXR 5/30 with no acute findings.  MRI pending.  Pt with  PMH of HTN and DM.   Subjective: Awake, alert    Recommendations for follow up therapy are one component of a multi-disciplinary discharge planning process, led by the attending physician.  Recommendations may be updated based on patient status, additional functional criteria and insurance authorization.  Assessment / Plan / Recommendation     12/28/2022   10:00 AM  Clinical Impressions  Clinical Impression Pt's vocal quality was mildly hoarse (improved from yesterday per documentation) although he appeared to have adequate vocal cord adduction during phonation. No  edema, mild erythema and noted to have dried secretions on pharyngeal wall that cleared during the FEES. He demonstrated adequate laryngeal strength and timing of swallow to protect airway without penetration or aspiration seen. Residue noted only with solid cracker in valleculae and posterior pharyngeal wall. Second swallows not successful to clear but sips thin helped to remove residue. His mastication was slow and prolonged with cracker. Recommend initiate Dys 3, thin liquids, pills whole in puree, assist with feeds. ST will follow.  SLP Visit Diagnosis Dysphagia, oral phase (R13.11)  Impact on safety and function Mild aspiration risk         12/28/2022   10:00 AM  Treatment Recommendations  Treatment Recommendations Therapy as outlined in treatment plan below        12/28/2022   10:00 AM  Prognosis  Prognosis for improved oropharyngeal function Good       12/28/2022   10:00 AM  Diet Recommendations  SLP Diet Recommendations Dysphagia 3 (Mech soft) solids;Thin liquid  Liquid Administration via Cup;Straw  Medication Administration Whole meds  with puree  Compensations Slow rate;Small sips/bites;Minimize environmental distractions  Postural Changes Remain semi-upright after after feeds/meals (Comment)         12/28/2022   10:00 AM  Other Recommendations  Oral Care Recommendations Oral care BID  Follow Up  Recommendations No SLP follow up  Functional Status Assessment Patient has had a recent decline in their functional status and demonstrates the ability to make significant improvements in function in a reasonable and predictable amount of time.       12/28/2022   10:00 AM  Frequency and Duration   Speech Therapy Frequency (ACUTE ONLY) min 2x/week  Treatment Duration 2 weeks         12/28/2022   10:00 AM  Oral Phase  Oral Phase Impaired  Oral - Thin Cup Right anterior bolus loss       12/28/2022   10:00 AM  Pharyngeal Phase  Pharyngeal Phase Impaired  Pharyngeal- Regular Pharyngeal residue - posterior pharnyx;Pharyngeal residue - valleculae        12/28/2022   10:00 AM  Cervical Esophageal Phase   Cervical Esophageal Phase WFL     Royce Macadamia 12/28/2022, 10:31 AM

## 2022-12-28 NOTE — Progress Notes (Signed)
Peripherally Inserted Central Catheter Placement  The IV Nurse has discussed with the patient and/or persons authorized to consent for the patient, the purpose of this procedure and the potential benefits and risks involved with this procedure.  The benefits include less needle sticks, lab draws from the catheter, and the patient may be discharged home with the catheter. Risks include, but not limited to, infection, bleeding, blood clot (thrombus formation), and puncture of an artery; nerve damage and irregular heartbeat and possibility to perform a PICC exchange if needed/ordered by physician.  Alternatives to this procedure were also discussed.  Bard Power PICC patient education guide, fact sheet on infection prevention and patient information card has been provided to patient /or left at bedside. Verbal consent obtained per pt request due to swelling and stiffness in arms.   PICC Placement Documentation  PICC Double Lumen 12/28/22 Right Basilic 44 cm 0 cm (Active)  Indication for Insertion or Continuance of Line Home intravenous therapies (PICC only) 12/28/22 1426  Exposed Catheter (cm) 0 cm 12/28/22 1426  Site Assessment Clean, Dry, Intact 12/28/22 1426  Lumen #1 Status Saline locked;Blood return noted 12/28/22 1426  Lumen #2 Status Saline locked;Blood return noted 12/28/22 1426  Dressing Type Transparent;Securing device 12/28/22 1426  Dressing Status Antimicrobial disc in place;Clean, Dry, Intact 12/28/22 1426  Safety Lock Not Applicable 12/28/22 1426  Line Care Connections checked and tightened 12/28/22 1426  Line Adjustment (NICU/IV Team Only) No 12/28/22 1426  Dressing Intervention New dressing 12/28/22 1426  Dressing Change Due 01/04/23 12/28/22 1426       Jim Ramirez 12/28/2022, 2:28 PM

## 2022-12-28 NOTE — Progress Notes (Signed)
Palliative Medicine Progress Note   Patient Name: Jim Ramirez       Date: 12/28/2022 DOB: 04/21/1951  Age: 72 y.o. MRN#: [id number] Attending Physician: Rolly Salter, MD Primary Care Physician: [name], MD Admit Date: 12/24/2022    HPI/Patient Profile: 72 y.o. male  with past medical history of DM and HTN who initially presented to [facility] on 12/22/2022 with complaint of back pain with radiation down both legs.  He was found to have MSSA bacteremia and osteonecrosis of the lumbar spine.  He was also found to have new onset A-fib with RVR.  He was transferred to [facility] on 12/24/2022.    Palliative Medicine has been consulted for goals of care conversations  Subjective: Chart reviewed. Note MRI brain negative for acute infarct, but with findings concerning for pyogenic ventriculitis.   Patient's mental status has improved from yesterday; he is currently eating lunch with assistance from a family member.   Family Meeting: Myself and Dr. Allena Katz met today at bedside with patient and multiple family members, including his sisters Harriett Sine and Elease Hashimoto, his brother Channing Mutters, and his niece Shanda Bumps.   Dr. Allena Katz provided a detailed review of patient's acute medical issues including severe sepsis with end-organ dysfunction, MSSA bacteremia, atrial fibrillation, lumbar spine osteonecrosis, and new concern for brain infection (ventriculitis).     Education offered regarding the seriousness of patient's current medical situation, his guarded prognosis, and his overall weakened/debilitated state. Discussed that he is high risk for ongoing complications and deterioration due to his multiple acute issues.   Code status was discussed. Encouraged consideration of DNR/DNI status understanding evidenced based  poor outcomes in similar hospitalized patients, as the cause of the arrest is likely associated with chronic/terminal disease rather than a reversible acute cardio-pulmonary event. I provided education that DNR/DNI does not change the medical plan and it only comes into effect after a person has arrested (died). Explained that it is a protective measure to keep [place] from harming a person in their last moments of life.  Patient states that he would not want to undergo resuscitation efforts in the event of cardiac arrest, and his family ultimately states they support his decision for DNR/DNI status.    Discussed the importance of continued conversation with patient, family, and the medical team regarding overall plan of care and  treatment options.   Objective:  Physical Exam Vitals reviewed.  Constitutional:      General: He is not in acute distress.    Appearance: He is ill-appearing.  Pulmonary:     Effort: Pulmonary effort is normal.  Neurological:     Mental Status: He is alert.     Motor: Weakness present.            Palliative Medicine Assessment & Plan   Assessment: Principal Problem:   Discitis of lumbar region Active Problems:   Diabetes mellitus type 2 in nonobese (HCC)   Dyslipidemia   Essential hypertension   New onset atrial fibrillation (HCC)   Sepsis (HCC)   BPH (benign prostatic hyperplasia)   Mood disorder (HCC)   MSSA bacteremia    Recommendations/Plan: Code status changed to DNR/DNI Continue current interventions - patient and family are hopeful for improvement Spiritual care assistance with advanced directives - patient wishes to designate his sister Harriett Sine as health care agent and his niece Shanda Bumps as alternate Ongoing palliative support   Prognosis:  Very guarded  Discharge Planning: To Be Determined   Thank you for allowing the Palliative Medicine Team to assist in the care of this patient.   Greater than 50%  of this time was spent counseling and  coordinating care related to the above assessment and plan.  Total time: 65 minutes   Merry Proud, NP Palliative Medicine   Please contact Palliative Medicine Team phone at [phone] for questions and concerns.  For individual provider, see AMION.

## 2022-12-28 NOTE — Progress Notes (Signed)
[facility] Progress Note Patient: Jim Ramirez:[id number] DOB: 1951/02/07 DOA: 12/24/2022  DOS: the patient was seen and examined on 12/28/2022  Brief hospital course: PMH of HTN and DM presenting with back pain.   He initially presented to [facility] on 5/26 with c/o chronic back pain with radiation down both legs.  He also noted SOB x 1 week.  Found to have Staph aureus bacteremia with osteonecrosis of the lumbar spine.  Also had A-fib with RVR.  Neurosurgery, ID, cardiology following. Assessment and Plan: Sepsis secondary to Staph aureus infection and bacteremia, osteomyelitis and lumbar spine discitis. Pyogenic ventriculitis Present on admission. SIRS criteria with leukocytosis tachycardia and tachypnea. Pt has Platelet count 59, Bilirubin 1.4, MAP 59 and S. Creatinine 1.23.  Troponin elevation. Suggesting SOFA score more than 2.  Blood culture at [facility] growing MSSA. Repeat blood cultures ordered.  So far no growth. ID following. Initially was on IV vancomycin.  Now on cefazolin.  Now on IV Nafcillin. Echo shows no evidence of vegetation.  Preserved EF.  TEE recommended once more stable. Neurosurgery was also consulted.  Currently thinking that spinal findings less likely infection.  Recommend no indication for surgical intervention currently.   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 Prior history of cervical fusion October 2023. Neurosurgery consulted.  Known to Dr. Lovell Sheehan. Appearance of the MRI per neurosurgery is consistent with vertebral body infarction or necrosis.  Less likely infection. Recommend conservative measures. Informed that there is a chance of vertebral body collapse. If this occurs he would need extensive reconstruction/stabilization.  Neurosurgery ordered lumbosacral corset.  Neurosurgery Recommend gabapentin. Monitor.  New onset A-fib. Cardiology consulted. Echocardiogram shows preserved EF 65 to 70% without any wall motion abnormality.   Hyperdynamic RV.  No valvular abnormality. Currently on amiodarone drip. Transition to PO closer to dispo: amiodarone 400 mg BID for 10 days then 200 mg daily.  Not a good candidate  for anticoagulation for now.  Will discuss with neurosurgery with regards to clearance once the platelets improved.   Acute thrombocytopenia. Baseline platelet count normal. Platelet count 63 currently.  Likely in the setting of marrow suppression due to sepsis and infection.  Avoid chemical DVT prophylaxis.  Cardiology currently recommending to hold off on anticoagulation as well as TEE. Once the platelets are stable cardiology recommend to consider anticoagulation though.  Right wrist edema. Concern for tenosynovitis. MRI shows evidence of fluid and swelling. Concern for tenosynovitis. Discussed with orthopedic surgery recommending no suspicion for infection and recommended range of motion exercises.   AKI. Baseline serum creatinine around 0.7. On admission serum creatinine 1.23. improved with IV hydration Monitor.   Elevated LFT and as well as hyperbilirubinemia. AST 90s ALT normal.  Bilirubin 1.9 on admission. Now better.    Elevated troponin. Likely demand ischemia. Not a candidate for anticoagulation.   Dyspnea. CTA chest with contrast on 12/23/2022 at outside facility negative for PE.  No pneumonia or pleural effusion or pneumothorax. Chest x-ray on 5/30 negative for pneumonia or vascular congestion. Monitor and continue supportive care.   BPH. Continue Flomax.   Type 2 diabetes mellitus, uncontrolled with hyperglycemia with long-term insulin use. Hemoglobin A1c 8.1.  On sliding scale insulin. Monitor.   HLD. On statin.   Mood disorder. On Lexapro.   OSA. Follows up with pulmonary outpatient. Continue CPAP nightly.  Left eye ptosis Chronic per pt. Has been present for a while pt. He reports it is due to cataract surgery.  CT head on 5/27 negative  for stroke.  MRI brain negative  for stroke.  Generalized weakness and lethargy  Check MRI brain. Less likely stroke.  PTOT seeing the pt.  Speech was consulted due to his lethargy recommended to stay NPO and FEES after result of the MRI is avaiable.   Goals of care conversation. Discussed in detail with regards to patient's prognosis and current condition. Recommended that the patient would benefit from being DNR with the focus is on quality of life.  Currently DNR/DNI.  Subjective:  No nausea no vomiting no fever no chills.  Continues to have back pain.  Physical Exam: General: in moderate distress, No Rash Cardiovascular: S1 and S2 Present, No Murmur Respiratory: Good respiratory effort, Bilateral Air entry present. No Crackles, No wheezes Abdomen: Bowel Sound present, No tenderness Extremities: No edema bilateral upper extremity edema Neuro: Alert and oriented x3, no new focal deficit, unchanged left eye ptosis.  Data Reviewed: I have Reviewed nursing notes, Vitals, and Lab results. Since last encounter, pertinent lab results CBC and BMP   . I have ordered test including CBC and BMP  . I have discussed pt's care plan and test results with palliative care, hand surgery, ID  .    Disposition: Status is: Inpatient Remains inpatient appropriate because: need further Antibiotics and work up.   SCDs Start: 12/24/22 1740   Family Communication: Multiple family members at bedside Level of care: Telemetry Cardiac   Vitals:   12/28/22 0000 12/28/22 0300 12/28/22 1000 12/28/22 1100  BP: (!) 145/69 (!) 143/86 138/74 (!) 147/64  Pulse:  99  91  Resp: 20 20 18 18   Temp:  100 F (37.8 C)  98.4 F (36.9 C)  TempSrc:  Oral  Oral  SpO2:  96% 92% 91%     Author: Lynden Oxford, MD 12/28/2022 5:41 PM  Please look on [email or web address] to find out who is on call.

## 2022-12-28 NOTE — Progress Notes (Signed)
Infectious disease follow-up note:  Palliative care note reviewed with plan for family meeting today at 3 PM with a guarded prognosis.  His Tmax over the last 24 hours is 100 F earlier this morning at 3 AM.  Leukocytosis is slightly improved, creatinine stable, and repeat blood cultures 12/24/2022 remain no growth to date.  He is currently on cefazolin for MSSA bacteremia.  He underwent MRI brain yesterday due to possibility of stroke in the setting of bacteremia and atrial fibrillation not on anticoagulation.  This showed concern for pyogenic ventriculitis without evidence of acute or subacute infarct.  MRI of his right wrist has also been obtained with formal radiology read pending.  Pending further goals of care discussion and the updated MRI brain findings overnight, will switch from cefazolin to nafcillin for better CNS coverage of MSSA.  ID will continue to follow.  Dr. Daiva Eves and/or Dr. Drue Second will take over the ID service on Monday.     Vedia Coffer for Infectious Disease [facility] 12/28/2022, 7:22 AM

## 2022-12-29 DIAGNOSIS — M4646 Discitis, unspecified, lumbar region: Secondary | ICD-10-CM | POA: Diagnosis not present

## 2022-12-29 LAB — COMPREHENSIVE METABOLIC PANEL
ALT: 17 U/L (ref 0–44)
AST: 24 U/L (ref 15–41)
Albumin: <1.5 g/dL — ABNORMAL LOW (ref 3.5–5.0)
Alkaline Phosphatase: 97 U/L (ref 38–126)
Anion gap: 8 (ref 5–15)
BUN: 24 mg/dL — ABNORMAL HIGH (ref 8–23)
CO2: 28 mmol/L (ref 22–32)
Calcium: 7.6 mg/dL — ABNORMAL LOW (ref 8.9–10.3)
Chloride: 103 mmol/L (ref 98–111)
Creatinine, Ser: 0.73 mg/dL (ref 0.61–1.24)
GFR, Estimated: >60 mL/min (ref >60)
Glucose, Bld: 319 mg/dL — ABNORMAL HIGH (ref 70–99)
Potassium: 3.8 mmol/L (ref 3.5–5.1)
Sodium: 139 mmol/L (ref 135–145)
Total Bilirubin: 1.2 mg/dL (ref 0.3–1.2)
Total Protein: 4.8 g/dL — ABNORMAL LOW (ref 6.5–8.1)

## 2022-12-29 LAB — CBC WITH DIFFERENTIAL/PLATELET
Abs Immature Granulocytes: 0.63 10*3/uL — ABNORMAL HIGH (ref 0.00–0.07)
Basophils Absolute: 0.1 10*3/uL (ref 0.0–0.1)
Basophils Relative: 0 %
Eosinophils Absolute: 0 10*3/uL (ref 0.0–0.5)
Eosinophils Relative: 0 %
HCT: 36.6 % — ABNORMAL LOW (ref 39.0–52.0)
Hemoglobin: 11.7 g/dL — ABNORMAL LOW (ref 13.0–17.0)
Immature Granulocytes: 5 %
Lymphocytes Relative: 12 %
Lymphs Abs: 1.5 10*3/uL (ref 0.7–4.0)
MCH: 25.5 pg — ABNORMAL LOW (ref 26.0–34.0)
MCHC: 32 g/dL (ref 30.0–36.0)
MCV: 79.7 fL — ABNORMAL LOW (ref 80.0–100.0)
Monocytes Absolute: 0.7 10*3/uL (ref 0.1–1.0)
Monocytes Relative: 6 %
Neutro Abs: 9.7 10*3/uL — ABNORMAL HIGH (ref 1.7–7.7)
Neutrophils Relative %: 77 %
Platelets: 112 10*3/uL — ABNORMAL LOW (ref 150–400)
RBC: 4.59 MIL/uL (ref 4.22–5.81)
RDW: 15.9 % — ABNORMAL HIGH (ref 11.5–15.5)
WBC: 12.5 10*3/uL — ABNORMAL HIGH (ref 4.0–10.5)
nRBC: 0 % (ref 0.0–0.2)

## 2022-12-29 LAB — CULTURE, BLOOD (ROUTINE X 2)
Culture: NO GROWTH
Culture: NO GROWTH

## 2022-12-29 LAB — GLUCOSE, CAPILLARY
Glucose-Capillary: 208 mg/dL — ABNORMAL HIGH (ref 70–99)
Glucose-Capillary: 249 mg/dL — ABNORMAL HIGH (ref 70–99)
Glucose-Capillary: 233 mg/dL — ABNORMAL HIGH (ref 70–99)
Glucose-Capillary: 225 mg/dL — ABNORMAL HIGH (ref 70–99)

## 2022-12-29 LAB — MAGNESIUM: Magnesium: 2 mg/dL (ref 1.7–2.4)

## 2022-12-29 MED ORDER — INSULIN GLARGINE-YFGN 100 UNIT/ML ~~LOC~~ SOLN
10.0000 [IU] | Freq: Every day | SUBCUTANEOUS | Status: DC
  Administered 2022-12-29 – 2022-12-30 (×2): 10 [IU] via SUBCUTANEOUS
  Filled 2022-12-29 (×3): qty 0.1

## 2022-12-29 MED ORDER — FUROSEMIDE 10 MG/ML IJ SOLN
20.0000 mg | Freq: Once | INTRAMUSCULAR | Status: AC
  Administered 2022-12-29: 20 mg via INTRAVENOUS
  Filled 2022-12-29: qty 2

## 2022-12-29 MED ORDER — AMIODARONE HCL 200 MG PO TABS
200.0000 mg | ORAL_TABLET | Freq: Every day | ORAL | Status: DC
  Administered 2023-01-08 – 2023-01-16 (×9): 200 mg via ORAL
  Filled 2022-12-29 (×9): qty 1

## 2022-12-29 MED ORDER — AMIODARONE HCL 200 MG PO TABS
400.0000 mg | ORAL_TABLET | Freq: Two times a day (BID) | ORAL | Status: AC
  Administered 2022-12-29 – 2023-01-07 (×20): 400 mg via ORAL
  Filled 2022-12-29 (×20): qty 2

## 2022-12-29 MED ORDER — LEVALBUTEROL HCL 0.63 MG/3ML IN NEBU
0.6300 mg | INHALATION_SOLUTION | Freq: Once | RESPIRATORY_TRACT | Status: AC
  Administered 2022-12-29: 0.63 mg via RESPIRATORY_TRACT
  Filled 2022-12-29: qty 3

## 2022-12-29 MED ORDER — FLUTICASONE PROPIONATE 50 MCG/ACT NA SUSP
2.0000 | Freq: Every day | NASAL | Status: DC
  Administered 2022-12-29 – 2023-01-28 (×28): 2 via NASAL
  Filled 2022-12-29 (×2): qty 16

## 2022-12-29 MED ORDER — LEVALBUTEROL HCL 0.63 MG/3ML IN NEBU: 0.6300 mg | INHALATION_SOLUTION | Freq: Four times a day (QID) | RESPIRATORY_TRACT | Status: DC | PRN

## 2022-12-29 NOTE — Plan of Care (Signed)
  Problem: Clinical Measurements: Goal: Respiratory complications will improve Outcome: Progressing Goal: Cardiovascular complication will be avoided Outcome: Progressing   Problem: Nutrition: Goal: Adequate nutrition will be maintained Outcome: Progressing   Problem: Coping: Goal: Level of anxiety will decrease Outcome: Progressing   Problem: Elimination: Goal: Will not experience complications related to urinary retention Outcome: Progressing   Problem: Pain Managment: Goal: General experience of comfort will improve Outcome: Progressing   Problem: Safety: Goal: Ability to remain free from injury will improve Outcome: Progressing   

## 2022-12-29 NOTE — Progress Notes (Signed)
[facility] Progress Note Patient: Jim Ramirez ZOX:[id number] DOB: 02-09-1951 DOA: 12/24/2022  DOS: the patient was seen and examined on 12/29/2022  Brief hospital course: PMH of HTN and DM presenting with back pain.   He initially presented to [facility] on 5/26 with c/o chronic back pain with radiation down both legs.  He also noted SOB x 1 week.  Found to have Staph aureus bacteremia with osteonecrosis of the lumbar spine.  Also had A-fib with RVR.  Neurosurgery, ID, cardiology following. Assessment and Plan: Sepsis secondary to Staph aureus infection and bacteremia, osteomyelitis and lumbar spine discitis. Pyogenic ventriculitis Present on admission. SIRS criteria with leukocytosis tachycardia and tachypnea. Pt has Platelet count 59, Bilirubin 1.4, MAP 59 and S. Creatinine 1.23.  Troponin elevation. Suggesting SOFA score more than 2.  Blood culture at [facility] growing MSSA. Repeat blood cultures ordered.  So far no growth. ID following. Initially was on IV vancomycin.  Now on cefazolin.  Now on IV Nafcillin. Echo shows no evidence of vegetation.  Preserved EF.  TEE recommended once more stable. Neurosurgery was also consulted.  Currently thinking that spinal findings less likely infection.  Recommend no indication for surgical intervention currently.   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 Prior history of cervical fusion October 2023. Neurosurgery consulted.  Known to Dr. Lovell Sheehan. Appearance of the MRI per neurosurgery is consistent with vertebral body infarction or necrosis.  Less likely infection. Recommend conservative measures. Informed that there is a chance of vertebral body collapse. If this occurs he would need extensive reconstruction/stabilization.  Neurosurgery ordered lumbosacral corset.  Neurosurgery Recommend gabapentin. Monitor.  New onset A-fib. Cardiology consulted. Echocardiogram shows preserved EF 65 to 70% without any wall motion abnormality.   Hyperdynamic RV.  No valvular abnormality. Currently on amiodarone drip. Transition to PO closer to dispo: amiodarone 400 mg BID for 10 days then 200 mg daily.  Not a good candidate  for anticoagulation for now.  Will discuss with neurosurgery with regards to clearance once the platelets improved.   Acute thrombocytopenia. Baseline platelet count normal. Platelet count 63 currently.  Likely in the setting of marrow suppression due to sepsis and infection.  Avoid chemical DVT prophylaxis.  Cardiology currently recommending to hold off on anticoagulation as well as TEE. Once the platelets are stable cardiology recommend to consider anticoagulation though.  Right wrist edema. Concern for tenosynovitis. MRI shows evidence of fluid and swelling. Concern for tenosynovitis. Discussed with orthopedic surgery recommending no suspicion for infection and recommended range of motion exercises. Give IV Lasix and monitor.   AKI. Baseline serum creatinine around 0.7. On admission serum creatinine 1.23. improved with IV hydration.  Monitor renal function while receiving IV Lasix. Monitor.   Elevated LFT and as well as hyperbilirubinemia. AST 90s ALT normal.  Bilirubin 1.9 on admission. Now better.    Elevated troponin. Likely demand ischemia. Not a candidate for anticoagulation.   Dyspnea. CTA chest with contrast on 12/23/2022 at outside facility negative for PE.  No pneumonia or pleural effusion or pneumothorax. Chest x-ray on 5/30 negative for pneumonia or vascular congestion. Monitor and continue supportive care.   BPH. Continue Flomax.   Type 2 diabetes mellitus, uncontrolled with hyperglycemia with long-term insulin use. Hemoglobin A1c 8.1.  On sliding scale insulin. Monitor.   HLD. On statin.   Mood disorder. On Lexapro.   OSA. Follows up with pulmonary outpatient. Continue CPAP nightly.  Left eye ptosis Chronic per pt. Has been present for a while pt. He  reports it is due to  cataract surgery.  CT head on 5/27 negative for stroke.  MRI brain negative for stroke.  Generalized weakness and lethargy  Check MRI brain. Less likely stroke.  PTOT seeing the pt.  Speech was consulted due to his lethargy recommended to stay NPO and FEES after result of the MRI is avaiable.   Goals of care conversation. Discussed in detail with regards to patient's prognosis and current condition. Recommended that the patient would benefit from being DNR with the focus is on quality of life.  Currently DNR/DNI.  Subjective:  Denies any acute complaint.  No nausea no vomiting no fever no chills.  No blood in the stool.  Physical Exam: In moderate distress.  Increased respiratory effort. Bilateral basal crackles.  Bilateral expiratory wheezing as well. Bowel sound present. Bilateral upper extremity edema seen.  Right upper extremity redness also seen. Bilateral trace edema seen.  No focal deficit so far.  Data Reviewed: I have Reviewed nursing notes, Vitals, and Lab results. Reviewed CBC and BMP.  Reviewed CBC and BMP.  Disposition: Status is: Inpatient Remains inpatient appropriate because: need further Antibiotics and work up.   SCDs Start: 12/24/22 1740   Family Communication: Multiple family members at bedside Level of care: Telemetry Cardiac   Vitals:   12/29/22 0300 12/29/22 0714 12/29/22 1257 12/29/22 1600  BP: (!) 134/57 (!) 144/54  126/64  Pulse:  75  78  Resp: 19 (!) 22  19  Temp: 97.9 F (36.6 C) (!) 97.5 F (36.4 C)    TempSrc: Oral Oral  Oral  SpO2: 92% 92% 96%      Author: Lynden Oxford, MD 12/29/2022 4:44 PM  Please look on [email or web address] to find out who is on call.

## 2022-12-30 ENCOUNTER — Inpatient Hospital Stay (HOSPITAL_COMMUNITY): Payer: Medicare HMO

## 2022-12-30 DIAGNOSIS — A419 Sepsis, unspecified organism: Secondary | ICD-10-CM | POA: Diagnosis not present

## 2022-12-30 DIAGNOSIS — I4891 Unspecified atrial fibrillation: Secondary | ICD-10-CM | POA: Diagnosis not present

## 2022-12-30 DIAGNOSIS — E440 Moderate protein-calorie malnutrition: Secondary | ICD-10-CM

## 2022-12-30 DIAGNOSIS — R609 Edema, unspecified: Secondary | ICD-10-CM

## 2022-12-30 DIAGNOSIS — M4646 Discitis, unspecified, lumbar region: Secondary | ICD-10-CM | POA: Diagnosis not present

## 2022-12-30 DIAGNOSIS — B9561 Methicillin susceptible Staphylococcus aureus infection as the cause of diseases classified elsewhere: Secondary | ICD-10-CM | POA: Diagnosis not present

## 2022-12-30 LAB — COMPREHENSIVE METABOLIC PANEL
ALT: 14 U/L (ref 0–44)
AST: 18 U/L (ref 15–41)
Albumin: <1.5 g/dL — ABNORMAL LOW (ref 3.5–5.0)
Alkaline Phosphatase: 89 U/L (ref 38–126)
Anion gap: 6 (ref 5–15)
BUN: 21 mg/dL (ref 8–23)
CO2: 31 mmol/L (ref 22–32)
Calcium: 7.4 mg/dL — ABNORMAL LOW (ref 8.9–10.3)
Chloride: 101 mmol/L (ref 98–111)
Creatinine, Ser: 0.62 mg/dL (ref 0.61–1.24)
GFR, Estimated: >60 mL/min (ref >60)
Glucose, Bld: 219 mg/dL — ABNORMAL HIGH (ref 70–99)
Potassium: 3.3 mmol/L — ABNORMAL LOW (ref 3.5–5.1)
Sodium: 138 mmol/L (ref 135–145)
Total Bilirubin: 0.9 mg/dL (ref 0.3–1.2)
Total Protein: 5 g/dL — ABNORMAL LOW (ref 6.5–8.1)

## 2022-12-30 LAB — GLUCOSE, CAPILLARY
Glucose-Capillary: 183 mg/dL — ABNORMAL HIGH (ref 70–99)
Glucose-Capillary: 196 mg/dL — ABNORMAL HIGH (ref 70–99)
Glucose-Capillary: 294 mg/dL — ABNORMAL HIGH (ref 70–99)
Glucose-Capillary: 244 mg/dL — ABNORMAL HIGH (ref 70–99)

## 2022-12-30 LAB — CBC WITH DIFFERENTIAL/PLATELET
Abs Immature Granulocytes: 0.56 10*3/uL — ABNORMAL HIGH (ref 0.00–0.07)
Basophils Absolute: 0 10*3/uL (ref 0.0–0.1)
Basophils Relative: 0 %
Eosinophils Absolute: 0 10*3/uL (ref 0.0–0.5)
Eosinophils Relative: 0 %
HCT: 36.4 % — ABNORMAL LOW (ref 39.0–52.0)
Hemoglobin: 11.2 g/dL — ABNORMAL LOW (ref 13.0–17.0)
Immature Granulocytes: 4 %
Lymphocytes Relative: 13 %
Lymphs Abs: 1.7 10*3/uL (ref 0.7–4.0)
MCH: 24.8 pg — ABNORMAL LOW (ref 26.0–34.0)
MCHC: 30.8 g/dL (ref 30.0–36.0)
MCV: 80.7 fL (ref 80.0–100.0)
Monocytes Absolute: 0.8 10*3/uL (ref 0.1–1.0)
Monocytes Relative: 6 %
Neutro Abs: 10.3 10*3/uL — ABNORMAL HIGH (ref 1.7–7.7)
Neutrophils Relative %: 77 %
Platelets: 120 10*3/uL — ABNORMAL LOW (ref 150–400)
RBC: 4.51 MIL/uL (ref 4.22–5.81)
RDW: 16.2 % — ABNORMAL HIGH (ref 11.5–15.5)
WBC: 13.4 10*3/uL — ABNORMAL HIGH (ref 4.0–10.5)
nRBC: 0 % (ref 0.0–0.2)

## 2022-12-30 LAB — MAGNESIUM: Magnesium: 2.1 mg/dL (ref 1.7–2.4)

## 2022-12-30 MED ORDER — SODIUM CHLORIDE 0.9 % IV SOLN: INTRAVENOUS | Status: DC

## 2022-12-30 MED ORDER — METHOCARBAMOL 500 MG PO TABS
500.0000 mg | ORAL_TABLET | Freq: Three times a day (TID) | ORAL | Status: DC
  Administered 2022-12-30 – 2023-01-07 (×27): 500 mg via ORAL
  Filled 2022-12-30 (×27): qty 1

## 2022-12-30 MED ORDER — POTASSIUM CHLORIDE CRYS ER 20 MEQ PO TBCR
40.0000 meq | EXTENDED_RELEASE_TABLET | ORAL | Status: AC
  Administered 2022-12-30 (×2): 40 meq via ORAL
  Filled 2022-12-30 (×2): qty 2

## 2022-12-30 MED ORDER — ZOLPIDEM TARTRATE 5 MG PO TABS
5.0000 mg | ORAL_TABLET | Freq: Every evening | ORAL | Status: DC | PRN
  Administered 2022-12-30 – 2023-01-23 (×6): 5 mg via ORAL
  Filled 2022-12-30 (×6): qty 1

## 2022-12-30 MED ORDER — ENOXAPARIN SODIUM 40 MG/0.4ML IJ SOSY
40.0000 mg | PREFILLED_SYRINGE | INTRAMUSCULAR | Status: DC
  Administered 2022-12-30 – 2022-12-31 (×2): 40 mg via SUBCUTANEOUS
  Filled 2022-12-30 (×2): qty 0.4

## 2022-12-30 NOTE — Progress Notes (Signed)
[facility] Progress Note Patient: Jim Ramirez ZOX:[id number] DOB: [birth year] DOA: 12/24/2022  DOS: the patient was seen and examined on 12/30/2022  Brief hospital course: PMH of HTN and DM presenting with back pain.   He initially presented to [facility] on 5/26 with c/o chronic back pain with radiation down both legs.  He also noted SOB x 1 week.  Underwent extensive evaluation including blood cultures, CT chest abdomen pelvis with contrast, CT head, CT lumbar spine and was found to have concern for discitis/osteomyelitis of his lumbar spine.  CT scan negative for PE. Transferred to [facility] for neurosurgery evaluation. Found to have Staph aureus bacteremia with osteonecrosis of the lumbar spine.  Also had A-fib with RVR.  Neurosurgery, ID, cardiology following. Assessment and Plan: Severe sepsis secondary to Staph aureus infection and bacteremia, osteomyelitis and Lumbar spine discitis. Pyogenic ventriculitis Present on admission. SIRS criteria on admission with leukocytosis tachycardia and tachypnea. Pt has Platelet count 59, Bilirubin 1.4, MAP 59 and S. Creatinine 1.23.  Troponin elevation. Suggesting SOFA score more than 2.  Blood culture at [facility] growing MSSA. Repeat blood cultures here So far no growth. ID following. Initially was on IV vancomycin.  Later on was on cefazolin.  Now on IV Nafcillin to cover for pyogenic ventriculitis. Echo shows no evidence of vegetation.  Preserved EF.  TEE recommended, scheduled on 6/4. Neurosurgery was also consulted.  Currently thinking that spinal findings less likely infection.  Recommend no indication for surgical intervention currently. Hand surgery was also consulted for right wrist infection, considers findings tenosynovitis and possible infection is likely secondary to edema only.   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 Prior history of cervical fusion October 2023. Neurosurgery consulted.  Known to Dr. Lovell Sheehan. Appearance of the  MRI per neurosurgery is consistent with vertebral body infarction or necrosis.  Less likely infection. Recommend conservative measures. Informed that there is a chance of vertebral body collapse. If this occurs he would need extensive reconstruction/stabilization.  Neurosurgery ordered lumbosacral corset.  Neurosurgery Recommend gabapentin.  Continue pain control. Monitor.   New onset A-fib. Cardiology consulted. Echocardiogram shows preserved EF 65 to 70% without any wall motion abnormality.  Hyperdynamic RV.  No valvular abnormality. Currently on amiodarone drip. Transition to PO closer to dispo: amiodarone 400 mg BID for 10 days then 200 mg daily.  Platelets have improved.  Neurosurgery has no concerns with regards to anticoagulation.  Will initiate anticoagulation after TEE tomorrow.  Acute thrombocytopenia. Baseline platelet count normal. Marrow suppression due to sepsis and infection.  Now resolved.   Right wrist edema. Concern for tenosynovitis. MRI shows Lobulated fluid collection at the edge of the field of view measuring approximately 3.0 x 1.4 x 2.2 cm.  of fluid and swelling. Concern for tenosynovitis. Consulted with orthopedic surgery recommending no suspicion for infection and recommended range of motion exercises. Give IV Lasix and monitor.   AKI. Baseline serum creatinine around 0.7. On admission serum creatinine 1.23. improved with IV hydration.  Monitor renal function while receiving IV Lasix. Monitor.   Elevated LFT and as well as hyperbilirubinemia. AST 90s ALT normal.  Bilirubin 1.9 on admission. Now better.    Elevated troponin. Likely demand ischemia.   Dyspnea. CTA chest with contrast on 12/23/2022 at outside facility negative for PE.  No pneumonia or pleural effusion or pneumothorax. Chest x-ray on 5/30 negative for pneumonia or vascular congestion.  BPH. Continue Flomax.   Type 2 diabetes mellitus, uncontrolled with hyperglycemia with long-term  insulin use.  Hemoglobin A1c 8.1.  On sliding scale insulin. Monitor.   HLD. On statin.   Mood disorder. On Lexapro.   OSA. Follows up with pulmonary outpatient. Continue CPAP nightly.   Left eye ptosis Chronic per pt. Has been present for a while pt. He reports it is due to cataract surgery.  CT head on 5/27 negative for stroke.  MRI brain negative for stroke.   Generalized weakness and lethargy  Check MRI brain. Less likely stroke.  PTOT seeing the pt.  Speech was consulted due to his lethargy recommended to stay NPO and FEES after result of the MRI is avaiable.    Goals of care conversation. Discussed in detail with regards to patient's prognosis and current condition. Recommended that the patient would benefit from being DNR with the focus is on quality of life.  Currently DNR/DNI.  Bilateral lower extremity SVT. Continue elevation. Will require anticoagulation for A-fib if can tolerate anticoagulation DVT prophylaxis dose.  Limited IV access. Patient had limited IV access and therefore PICC line is placed. SVTs are not secondary to PICC line.   Subjective: Continues to report severe pain.  No nausea no vomiting no fever no chills.  Pain is reported on the right thigh area.  Add muscle relaxant.  Physical Exam: General: in moderate distress, No Rash Cardiovascular: S1 and S2 Present, No Murmur Respiratory: Good respiratory effort, Bilateral Air entry present. No Crackles, resolved wheezes Abdomen: Bowel Sound present, No tenderness Extremities: Bilateral upper extremity edema Neuro: Alert and oriented x3, no new focal deficit  Data Reviewed: I have Reviewed nursing notes, Vitals, and Lab results. Since last encounter, pertinent lab results CBC and BMP   . I have ordered test including CBC and BMP  . I have discussed pt's care plan and test results with cardiology  .   Disposition: Status is: Inpatient Remains inpatient appropriate because: Continue IV  antibiotics  enoxaparin (LOVENOX) injection 40 mg Start: 12/30/22 1200 SCDs Start: 12/24/22 1740   Family Communication: No one at bedside Level of care: Telemetry Cardiac   Vitals:   12/29/22 1927 12/29/22 2225 12/30/22 0409 12/30/22 0832  BP: (!) 123/59   (!) 158/60  Pulse: 77 74 74   Resp: 20   17  Temp: 99 F (37.2 C) 98.7 F (37.1 C) 98.6 F (37 C) 98.3 F (36.8 C)  TempSrc: Axillary Axillary Axillary Oral  SpO2: 100%   96%     Author: Lynden Oxford, MD 12/30/2022 2:13 PM  Please look on [email or web address] to find out who is on call.

## 2022-12-30 NOTE — Progress Notes (Signed)
Speech Language Pathology Treatment: Dysphagia  Patient Details Name: Jim Ramirez MRN: [id number] DOB: 22-Apr-1951 Today's Date: 12/30/2022 Time: 4098-1191 SLP Time Calculation (min) (ACUTE ONLY): 13 min  Assessment / Plan / Recommendation Clinical Impression  Pt observed following FEES 6/1. He reported no difficulty swallowing since assessment and was repositioned upright taking sips thin via straw without indications of aspiration. Masticated regular texture with mild delays and appears to be appropriate to continue Dys 3 and for energy conservation. Observed pills with RN who broke large pill in half which he took (3 halves). He appeared to have mildly increase work of breathing, appeared fatigued and had multitude of other pills left to take and therapist recommended to place pill whole in puree. Reiterated importance of upright position during po intake. Recommend he continue Dys 3 (chopped meats) on acute and can upgrade at next level of care, continue thin liquids, pills whole in puree. ST will sign off at this time.   HPI HPI: Jim Ramirez is a 72 yo male who initially presented to [facility] on 5/26 with c/o chronic back pain with radiation down both legs.  He also noted SOB x 1 week.  Found to have Staph aureus bacteremia with osteonecrosis of the lumbar spine.  Also had A-fib with RVR.  Neurosurgery, ID, cardiology following. CXR 5/30 with no acute findings.  MRI pending.  Pt with PMH of HTN and DM.      SLP Plan  All goals met;Discharge SLP treatment due to (comment)      Recommendations for follow up therapy are one component of a multi-disciplinary discharge planning process, led by the attending physician.  Recommendations may be updated based on patient status, additional functional criteria and insurance authorization.    Recommendations  Diet recommendations: Dysphagia 3 (mechanical soft);Thin liquid Liquids provided via: Cup;Straw Medication Administration: Whole meds with  puree Supervision: Staff to assist with self feeding;Full supervision/cueing for compensatory strategies Compensations: Slow rate;Small sips/bites;Minimize environmental distractions Postural Changes and/or Swallow Maneuvers: Seated upright 90 degrees                  Oral care BID   None Dysphagia, oral phase (R13.11)     All goals met;Discharge SLP treatment due to (comment)     Royce Macadamia  12/30/2022, 12:36 PM

## 2022-12-30 NOTE — Care Management Important Message (Signed)
Important Message  Patient Details  Name: Jim Ramirez MRN: [id number] Date of Birth: [birth year]   Medicare Important Message Given:  Yes     Dorena Bodo 12/30/2022, 3:32 PM

## 2022-12-30 NOTE — Progress Notes (Signed)
Cardiology Progress Note  Patient ID: Jim Ramirez MRN: [id number] DOB: Mar 28, 1951 Date of Encounter: 12/30/2022  Primary Cardiologist: Dina Rich, MD  Subjective   Chief Complaint: None.   HPI: Resting comfortably in the room.  Denies any symptoms.  Blood counts are improving.  Appears to be alert and oriented appropriately.   ROS:  All other ROS reviewed and negative. Pertinent positives noted in the HPI.     Inpatient Medications  Scheduled Meds:  acetaminophen  500 mg Oral TID   amiodarone  400 mg Oral BID   Followed by   Melene Muller ON 01/08/2023] amiodarone  200 mg Oral Daily   azelastine  1 spray Each Nare BID   Chlorhexidine Gluconate Cloth  6 each Topical Daily   escitalopram  10 mg Oral Daily   fluticasone  2 spray Each Nare Daily   gabapentin  100 mg Oral BID   insulin aspart  0-15 Units Subcutaneous TID WC   insulin aspart  0-5 Units Subcutaneous QHS   insulin glargine-yfgn  10 Units Subcutaneous QHS   ketotifen  1 drop Left Eye BID   lidocaine  2 patch Transdermal Q24H   loratadine  10 mg Oral Daily   methocarbamol  500 mg Oral TID   pantoprazole  40 mg Oral BID   potassium chloride  40 mEq Oral Q2H   rosuvastatin  20 mg Oral Daily   sodium chloride flush  10-40 mL Intracatheter Q12H   sodium chloride flush  3 mL Intravenous Q12H   tamsulosin  0.4 mg Oral Daily   Continuous Infusions:  nafcillin 12 g in sodium chloride 0.9 % 500 mL continuous infusion 20.8 mL/hr at 12/30/22 0701   PRN Meds: acetaminophen **OR** acetaminophen, levalbuterol, metoprolol tartrate, morphine injection, ondansetron **OR** ondansetron (ZOFRAN) IV, oxyCODONE, sodium chloride flush   Vital Signs   Vitals:   12/29/22 1600 12/29/22 1927 12/29/22 2225 12/30/22 0409  BP: 126/64 (!) 123/59    Pulse: 78 77 74 74  Resp: 19 20    Temp:  99 F (37.2 C) 98.7 F (37.1 C) 98.6 F (37 C)  TempSrc: Oral Axillary Axillary Axillary  SpO2:  100%      Intake/Output Summary (Last 24  hours) at 12/30/2022 0827 Last data filed at 12/30/2022 0742 Gross per 24 hour  Intake 2049.95 ml  Output 4200 ml  Net -2150.05 ml      10/07/2022   10:36 AM 09/30/2022   11:04 AM 09/23/2022   10:38 AM  Last 3 Weights  Weight (lbs) 208 lb 207 lb 207 lb  Weight (kg) 94.348 kg 93.895 kg 93.895 kg      Telemetry  Overnight telemetry shows SR 70s, which I personally reviewed.     Physical Exam   Vitals:   12/29/22 1600 12/29/22 1927 12/29/22 2225 12/30/22 0409  BP: 126/64 (!) 123/59    Pulse: 78 77 74 74  Resp: 19 20    Temp:  99 F (37.2 C) 98.7 F (37.1 C) 98.6 F (37 C)  TempSrc: Oral Axillary Axillary Axillary  SpO2:  100%      Intake/Output Summary (Last 24 hours) at 12/30/2022 0827 Last data filed at 12/30/2022 0742 Gross per 24 hour  Intake 2049.95 ml  Output 4200 ml  Net -2150.05 ml       10/07/2022   10:36 AM 09/30/2022   11:04 AM 09/23/2022   10:38 AM  Last 3 Weights  Weight (lbs) 208 lb 207 lb 207 lb  Weight (  kg) 94.348 kg 93.895 kg 93.895 kg    There is no height or weight on file to calculate BMI.  General: Well nourished, well developed, in no acute distress Head: Atraumatic, normal size  Eyes: PEERLA, EOMI  Neck: Supple, no JVD Endocrine: No thryomegaly Cardiac: Normal S1, S2; RRR; no murmurs, rubs, or gallops Lungs: Diminished breath sounds bilaterally Abd: Soft, nontender, no hepatomegaly  Ext: No edema, pulses 2+ Musculoskeletal: No deformities, BUE and BLE strength normal and equal Skin: Warm and dry, no rashes   Neuro: Alert and oriented to person, place, time, and situation, CNII-XII grossly intact, no focal deficits  Psych: Normal mood and affect   Labs  High Sensitivity Troponin:   Recent Labs  Lab 12/24/22 1646 12/24/22 1759  TROPONINIHS 227* 181*     Cardiac EnzymesNo results for input(s): "TROPONINI" in the last 168 hours. No results for input(s): "TROPIPOC" in the last 168 hours.  Chemistry Recent Labs  Lab 12/27/22 0013  12/28/22 0028 12/29/22 1051 12/30/22 0150  NA 138 143 139 138  K 4.2 3.8 3.8 3.3*  CL 102 106 103 101  CO2 26 23 28 31   GLUCOSE 191* 184* 319* 219*  BUN 33* 29* 24* 21  CREATININE 0.81 0.80 0.73 0.62  CALCIUM 8.0* 8.1* 7.6* 7.4*  PROT 4.6*  --  4.8* 5.0*  ALBUMIN <1.5*  --  <1.5* <1.5*  AST 93*  --  24 18  ALT 35  --  17 14  ALKPHOS 118  --  97 89  BILITOT 1.4*  --  1.2 0.9  GFRNONAA >60 >60 >60 >60  ANIONGAP 10 14 8 6     Hematology Recent Labs  Lab 12/28/22 0028 12/29/22 1051 12/30/22 0150  WBC 13.2* 12.5* 13.4*  RBC 4.88 4.59 4.51  HGB 12.3* 11.7* 11.2*  HCT 38.3* 36.6* 36.4*  MCV 78.5* 79.7* 80.7  MCH 25.2* 25.5* 24.8*  MCHC 32.1 32.0 30.8  RDW 15.8* 15.9* 16.2*  PLT 85* 112* 120*   BNPNo results for input(s): "BNP", "PROBNP" in the last 168 hours.  DDimer No results for input(s): "DDIMER" in the last 168 hours.   Radiology  VAS [place] LOWER EXTREMITY VENOUS (DVT)  Result Date: 12/28/2022  Lower Venous DVT Study Patient Name:  Jim Ramirez  Date of Exam:   12/28/2022 Medical Rec #: [id number]         Accession #:    [id number] Date of Birth: 1951/03/28         Patient Gender: M Patient Age:   72 years Exam Location:  [facility] Procedure:      VAS [place] LOWER EXTREMITY VENOUS (DVT) Referring Phys: PRANAV PATEL --------------------------------------------------------------------------------  Indications: Edema.  Risk Factors: None identified. Limitations: Poor ultrasound/tissue interface and patient positioning. Comparison Study: No prior studies. Performing Technologist: Chanda Busing RVT  Examination Guidelines: A complete evaluation includes B-mode imaging, spectral Doppler, color Doppler, and power Doppler as needed of all accessible portions of each vessel. Bilateral testing is considered an integral part of a complete examination. Limited examinations for reoccurring indications may be performed as noted. The reflux portion of the exam is performed with the  patient in reverse Trendelenburg.  +---------+---------------+---------+-----------+----------+--------------+ RIGHT    CompressibilityPhasicitySpontaneityPropertiesThrombus Aging +---------+---------------+---------+-----------+----------+--------------+ CFV      Full           Yes      Yes                                 +---------+---------------+---------+-----------+----------+--------------+  SFJ      Full                                                        +---------+---------------+---------+-----------+----------+--------------+ FV Prox  Full                                                        +---------+---------------+---------+-----------+----------+--------------+ FV Mid   Full                                                        +---------+---------------+---------+-----------+----------+--------------+ FV DistalFull                                                        +---------+---------------+---------+-----------+----------+--------------+ PFV      Full                                                        +---------+---------------+---------+-----------+----------+--------------+ POP      Full           Yes      Yes                                 +---------+---------------+---------+-----------+----------+--------------+ PTV      Full                                                        +---------+---------------+---------+-----------+----------+--------------+ PERO     Full                                                        +---------+---------------+---------+-----------+----------+--------------+   +---------+---------------+---------+-----------+----------+-------------------+ LEFT     CompressibilityPhasicitySpontaneityPropertiesThrombus Aging      +---------+---------------+---------+-----------+----------+-------------------+ CFV      Full           Yes      Yes                                       +---------+---------------+---------+-----------+----------+-------------------+ SFJ      Full                                                             +---------+---------------+---------+-----------+----------+-------------------+  FV Prox  Full                                                             +---------+---------------+---------+-----------+----------+-------------------+ FV Mid   Full                                                             +---------+---------------+---------+-----------+----------+-------------------+ FV DistalFull                                                             +---------+---------------+---------+-----------+----------+-------------------+ PFV      Full                                                             +---------+---------------+---------+-----------+----------+-------------------+ POP      Full           Yes      Yes                                      +---------+---------------+---------+-----------+----------+-------------------+ PTV      Full                                                             +---------+---------------+---------+-----------+----------+-------------------+ PERO                                                  Not well visualized +---------+---------------+---------+-----------+----------+-------------------+     Summary: RIGHT: - There is no evidence of deep vein thrombosis in the lower extremity.  - No cystic structure found in the popliteal fossa.  LEFT: - There is no evidence of deep vein thrombosis in the lower extremity. However, portions of this examination were limited- see technologist comments above.  - No cystic structure found in the popliteal fossa.  *See table(s) above for measurements and observations. Electronically signed by Sherald Hess MD on 12/28/2022 at 9:39:16 AM.    Final    VAS [place] UPPER EXTREMITY VENOUS DUPLEX  Result Date:  12/28/2022 UPPER VENOUS STUDY  Patient Name:  Jim Ramirez  Date of Exam:   12/28/2022 Medical Rec #: [id number]         Accession #:    [id number] Date of Birth: 1950-09-05         Patient Gender: M Patient Age:  72 years Exam Location:  [facility] Procedure:      VAS [place] UPPER EXTREMITY VENOUS DUPLEX Referring Phys: PRANAV PATEL --------------------------------------------------------------------------------  Indications: Edema Risk Factors: None identified. Limitations: Poor ultrasound/tissue interface, bandages and line. Comparison Study: No prior studies. Performing Technologist: Chanda Busing RVT  Examination Guidelines: A complete evaluation includes B-mode imaging, spectral Doppler, color Doppler, and power Doppler as needed of all accessible portions of each vessel. Bilateral testing is considered an integral part of a complete examination. Limited examinations for reoccurring indications may be performed as noted.  Right Findings: +----------+------------+---------+-----------+----------+-------+ RIGHT     CompressiblePhasicitySpontaneousPropertiesSummary +----------+------------+---------+-----------+----------+-------+ IJV           Full       Yes       Yes                      +----------+------------+---------+-----------+----------+-------+ Subclavian    Full       Yes       Yes                      +----------+------------+---------+-----------+----------+-------+ Axillary      Full       Yes       Yes                      +----------+------------+---------+-----------+----------+-------+ Brachial      Full                                          +----------+------------+---------+-----------+----------+-------+ Radial        Full                                          +----------+------------+---------+-----------+----------+-------+ Ulnar         Full                                           +----------+------------+---------+-----------+----------+-------+ Cephalic      Full                                          +----------+------------+---------+-----------+----------+-------+ Basilic     Partial                                  Acute  +----------+------------+---------+-----------+----------+-------+  Left Findings: +----------+------------+---------+-----------+----------+-------+ LEFT      CompressiblePhasicitySpontaneousPropertiesSummary +----------+------------+---------+-----------+----------+-------+ Subclavian    Full       Yes       Yes                      +----------+------------+---------+-----------+----------+-------+  Summary:  Right: No evidence of deep vein thrombosis in the upper extremity. Findings consistent with acute superficial vein thrombosis involving the right basilic vein.  Left: No evidence of thrombosis in the subclavian.  *See table(s) above for measurements and observations.  Diagnosing physician: Sherald Hess MD Electronically signed by Sherald Hess MD on 12/28/2022 at 9:38:34 AM.    Final  Cardiac Studies  TTE 12/25/2022  1. Left ventricular ejection fraction, by estimation, is 65 to 70%. The  left ventricle has normal function. Left ventricular endocardial border  not optimally defined to evaluate regional wall motion. Indeterminate  diastolic filling due to E-A fusion.   2. Right ventricular systolic function is hyperdynamic. The right  ventricular size is not well visualized. Tricuspid regurgitation signal is  inadequate for assessing PA pressure.   3. The mitral valve was not well visualized. No evidence of mitral valve  regurgitation.   4. The aortic valve was not well visualized. Aortic valve regurgitation  is not visualized. No aortic stenosis is present.   5. The inferior vena cava is dilated in size with >50% respiratory  variability, suggesting right atrial pressure of 8 mmHg.   6. Technically  difficult study.   Patient Profile  Jim Ramirez is a 72 y.o. male with diabetes, subarachnoid hemorrhage who was admitted on 12/22/2022 with concerns for lumbar spine osteomyelitis and MSSA bacteremia.  Course also complicated by atrial fibrillation with RVR.  Cardiology consulted for further recommendations.  Assessment & Plan   # MSSA bacteremia # Sepsis # Concern for lumbar spine osteomyelitis # Pyogenic ventriculitis -Condition is much improved.  Platelets are stable.  No difficulty swallowing.  Currently on antibiotics.  Transesophageal echo recommended by infectious diseases.  I think he is stable for this tomorrow.  We will plan for this.  N.p.o. at midnight.  # New onset A-fib with RVR -Secondary to sepsis.  Echo shows normal LV function. -Thyroid studies are normal. -Has converted back to sinus rhythm.  On amiodarone.  Would recommend to continue oral amiodarone for at least 30 days.  This will keep him in rhythm while he is being treated for his infection.  Would not plan for long-term therapy. -Not currently on anticoagulation given findings of pyogenic ventriculitis as well as concerns for possible osteonecrosis and osteomyelitis of the lumbar spine. -Suspect anticoagulation will be okay.  We will await neurosurgery final recommendations.      For questions or updates, please contact [facility] Please consult [email or web address] for contact info under        Signed, Gerri Spore T. Flora Lipps, MD, [facility]  [facility]  12/30/2022 8:27 AM

## 2022-12-30 NOTE — Progress Notes (Signed)
    [facility] has been requested to perform a transesophageal echocardiogram on Jim Ramirez for bacteremia.  After careful review of history and examination, the risks and benefits of transesophageal echocardiogram have been explained including risks of esophageal damage, perforation (1:10,000 risk), bleeding, pharyngeal hematoma as well as other potential complications associated with conscious sedation including aspiration, arrhythmia, respiratory failure and death. Alternatives to treatment were discussed, questions were answered. Patient is willing to proceed.   Jonita Albee, PA-C 12/30/2022 2:32 PM

## 2022-12-30 NOTE — Progress Notes (Addendum)
[facility]    Date of Admission:  12/24/2022   Total days of antibiotics 7/day 3 of nafcillin          ID: Jim Ramirez is a 72 y.o. male with MSSA bacteremia, lumbar osteomeylitis, and ventriculitis, and possible septic CNS emboli Principal Problem:   Discitis of lumbar region Active Problems:   Diabetes mellitus type 2 in nonobese (HCC)   Dyslipidemia   Essential hypertension   New onset atrial fibrillation (HCC)   Sepsis (HCC)   BPH (benign prostatic hyperplasia)   Mood disorder (HCC)   MSSA bacteremia    Subjective: Afebrile, still has some discomfort to back. He is interested in having physical therapy, mentioned to me during interview  Medications:   acetaminophen  500 mg Oral TID   amiodarone  400 mg Oral BID   Followed by   Melene Muller ON 01/08/2023] amiodarone  200 mg Oral Daily   azelastine  1 spray Each Nare BID   Chlorhexidine Gluconate Cloth  6 each Topical Daily   escitalopram  10 mg Oral Daily   fluticasone  2 spray Each Nare Daily   gabapentin  100 mg Oral BID   insulin aspart  0-15 Units Subcutaneous TID WC   insulin aspart  0-5 Units Subcutaneous QHS   insulin glargine-yfgn  10 Units Subcutaneous QHS   ketotifen  1 drop Left Eye BID   lidocaine  2 patch Transdermal Q24H   loratadine  10 mg Oral Daily   methocarbamol  500 mg Oral TID   pantoprazole  40 mg Oral BID   potassium chloride  40 mEq Oral Q2H   rosuvastatin  20 mg Oral Daily   sodium chloride flush  10-40 mL Intracatheter Q12H   sodium chloride flush  3 mL Intravenous Q12H   tamsulosin  0.4 mg Oral Daily    Objective: Vital signs in last 24 hours: Temp:  [98.3 F (36.8 C)-99 F (37.2 C)] 98.3 F (36.8 C) (06/03 0832) Pulse Rate:  [74-78] 74 (06/03 0409) Resp:  [17-20] 17 (06/03 0832) BP: (123-158)/(59-64) 158/60 (06/03 0832) SpO2:  [96 %-100 %] 96 % (06/03 1610)  Physical Exam  Constitutional: He is oriented to person, place, and time. He appears  well-developed and well-nourished. No distress.  HENT:  Mouth/Throat: Oropharynx is clear and moist. No oropharyngeal exudate.  Cardiovascular: Normal rate, regular rhythm and normal heart sounds. Exam reveals no gallop and no friction rub.  No murmur heard.  Pulmonary/Chest: Effort normal and breath sounds normal. No respiratory distress. He has no wheezes.  Abdominal: Soft. Bowel sounds are normal. He exhibits no distension. There is no tenderness.  Lymphadenopathy:  He has no cervical adenopathy.  Neurological: He is alert and oriented to person, place, and time.  Ext: swelling to right upper hand/arm. Bruising about left ankle. Skin: Skin is warm and dry. No rash noted. No erythema.  Psychiatric: He has a normal mood and affect. His behavior is normal.    Lab Results Recent Labs    12/29/22 1051 12/30/22 0150  WBC 12.5* 13.4*  HGB 11.7* 11.2*  HCT 36.6* 36.4*  NA 139 138  K 3.8 3.3*  CL 103 101  CO2 28 31  BUN 24* 21  CREATININE 0.73 0.62   Liver Panel Recent Labs    12/29/22 1051 12/30/22 0150  PROT 4.8* 5.0*  ALBUMIN <1.5* <1.5*  AST 24 18  ALT 17 14  ALKPHOS 97 89  BILITOT 1.2 0.9  Sedimentation Rate No results for input(s): "ESRSEDRATE" in the last 72 hours. C-Reactive Protein No results for input(s): "CRP" in the last 72 hours.  Microbiology: Blood cx 5/28 ngtd Studies/Results: Impression: 1. Small foci of restricted diffusion with ADC correlate in the bilateral occipital horns, concerning for pyogenic ventriculitis. No appended mole enhancement and only minimal periventricular T2 hyperintense signal, which could also represent sequela of chronic small vessel ischemic disease.  A/P: Continue with nafcillin for disseminated MSSA infection including cns ventriculitis and L$-L5 osteonecrosis - anticipate 6 wk of iv abtx. Will consider switching to cefazolin after 4 wks of nafcillin  I have personally spent 50 minutes involved in face-to-face and  non-face-to-face activities for this patient on the day of the visit. Professional time spent includes the following activities: Preparing to see the patient (review of tests), Obtaining and/or reviewing separately obtained history (admission/discharge record), Performing a medically appropriate examination and/or evaluation , Ordering medications/tests/procedures, referring and communicating with other health care professionals, Documenting clinical information in the EMR, Independently interpreting results (not separately reported), Communicating results to the patient/family/caregiver, Counseling and educating the patient/family/caregiver and Care coordination (not separately reported).     [facility] Pager: [phone]  12/30/2022, 10:14 AM

## 2022-12-30 NOTE — Progress Notes (Signed)
PHARMACY CONSULT NOTE FOR:  OUTPATIENT  PARENTERAL ANTIBIOTIC THERAPY (OPAT)  Indication: Disseminated MSSA infection including osteo/ventriculitis Regimen: Nafcillin 12g IV daily as a continuous infusion thru 01/10/23 followed by Cefazolin 2g IV every 8 hours thru 02/18/23 End date: 02/18/23 (8 weeks from neg BCx on 12/24/22)  IV antibiotic discharge orders are pended. To discharging provider:  please sign these orders via discharge navigator,  Select New Orders & click on the button choice - Manage This Unsigned Work.     Thank you for allowing pharmacy to be a part of this patient's care.  Georgina Pillion, PharmD, BCPS Infectious Diseases Clinical Pharmacist 01/08/2023 1:09 PM   **Pharmacist phone directory can now be found on [email or web address] ([email or web address]).  Listed under [facility].

## 2022-12-30 NOTE — TOC Initial Note (Addendum)
Transition of Care [facility]) - Initial/Assessment Note    Patient Details  Name: Jim Ramirez MRN: [id number] Date of Birth: Dec 19, 1950  [facility]) CM/SW Contact:    Jim Haven, RN Phone Number: 12/30/2022, 4:17 PM  Clinical Narrative:                 NCM covering today, patient is from home with his brother, he has PCP and insurance on file.  He has a walker, w/chair and hospital bed at home. His brother , Jim Ramirez is his support system.  He will need ambulance transport home at dc.  [facility] [place].  He gave this NCM permission to speak with his sister, Jim Ramirez.  She states he will need HHRN at dc.  NCM offered choice, he has no preference, just as long as it is covered by his insurance.  NCM made referral to [facility] with [facility] for HHRN, HHPT, HHOT, HHAIDE, she is able to take referral.  Soc will begin 24 to 48 hrs post dc.  Patient  will need 24 hr care at  home if he is going home, which Pollyann Kennedy his sister says he does  not have, PT/OT will see  him tomorrow, will await PT/OT eval, he may need a SNF.   TOC following.        Patient Goals and CMS Choice            Expected Discharge Plan and Services                                              Prior Living Arrangements/Services                       Activities of Daily Living   ADL Screening (condition at time of admission) Is the patient deaf or have difficulty hearing?: No Does the patient have difficulty seeing, even when wearing glasses/contacts?: No Does the patient have difficulty concentrating, remembering, or making decisions?: No Does the patient have difficulty dressing or bathing?: Yes Does the patient have difficulty walking or climbing stairs?: Yes  Permission Sought/Granted                  Emotional Assessment              Admission diagnosis:  Discitis of lumbar region [M46.46] Patient Active Problem List   Diagnosis Date  Noted   MSSA bacteremia 12/26/2022   New onset atrial fibrillation (HCC) 12/24/2022   Discitis of lumbar region 12/24/2022   Sepsis (HCC) 12/24/2022   BPH (benign prostatic hyperplasia) 12/24/2022   Mood disorder (HCC) 12/24/2022   Cervical spondylosis with myelopathy and radiculopathy 05/22/2022   Diabetes mellitus type 2 in nonobese (HCC) 02/26/2008   Dyslipidemia 02/26/2008   Essential hypertension 02/26/2008   INTERNAL HEMORRHOIDS 02/26/2008   EXTERNAL HEMORRHOIDS 02/26/2008   DUODENAL ULCER 02/26/2008   GASTRITIS 02/26/2008   DUODENITIS 02/26/2008   MELENA 02/26/2008   NECK PAIN 02/26/2008   INSOMNIA 02/26/2008   SLEEP APNEA 02/26/2008   ABDOMINAL CRAMPS 02/26/2008   RECTAL BLEEDING, HX OF 02/26/2008   PCP:  [name], MD Pharmacy:   [phone] [place], [place] - [address] [place] [address] Phone: [phone] Fax: [phone]  Social Determinants of Health (SDOH) Social History: SDOH Screenings   Transportation Needs: No Transportation Needs (12/26/2022)  Utilities: Not At Risk (12/26/2022)  Tobacco Use: Medium Risk (12/26/2022)   SDOH Interventions:     Readmission Risk Interventions     No data to display

## 2022-12-30 NOTE — Inpatient Diabetes Management (Signed)
Inpatient Diabetes Program Recommendations  AACE/ADA: New Consensus Statement on Inpatient Glycemic Control (2015)  Target Ranges:  Prepandial:   less than 140 mg/dL      Peak postprandial:   less than 180 mg/dL (1-2 hours)      Critically ill patients:  140 - 180 mg/dL   Lab Results  Component Value Date   GLUCAP 183 (H) 12/30/2022   HGBA1C 8.5 (H) 12/24/2022    Review of Glycemic Control  Latest Reference Range & Units 12/29/22 13:31 12/29/22 16:35 12/29/22 21:31 12/30/22 06:19  Glucose-Capillary 70 - 99 mg/dL 161 (H) 096 (H) 045 (H) 183 (H)  (H): Data is abnormally high Diabetes history: Type 2 DM Outpatient Diabetes medications: Metformin 1000 mg BID, Jardiance 25 mg QD Current orders for Inpatient glycemic control: Novolog 0-15 units TID & HS, Semglee 10 units QHS   Inpatient Diabetes Program Recommendations:    Consider: - Increasing Semglee to 14 units QD - Adding Novolog 3 units TID (Assuming patient consuming >50% of meals)   Thanks, Lujean Rave, MSN, RNC-OB Diabetes Coordinator [phone] (8a-5p)

## 2022-12-30 NOTE — Progress Notes (Signed)
Bilateral upper extremity venous study completed.   Preliminary results relayed to MD and RN.  Please see CV Procedures for preliminary results.  Graysin Luczynski, RVT  1:01 PM 12/30/22

## 2022-12-31 ENCOUNTER — Inpatient Hospital Stay (HOSPITAL_COMMUNITY): Payer: Medicare HMO | Admitting: Registered Nurse

## 2022-12-31 ENCOUNTER — Encounter (HOSPITAL_COMMUNITY)
Disposition: A | Discharge status: Skilled Nursing Facility | Payer: Self-pay | Source: Other Acute Inpatient Hospital | Attending: Internal Medicine

## 2022-12-31 ENCOUNTER — Inpatient Hospital Stay (HOSPITAL_COMMUNITY): Payer: Medicare HMO

## 2022-12-31 ENCOUNTER — Encounter (HOSPITAL_COMMUNITY): Payer: Self-pay | Admitting: Internal Medicine

## 2022-12-31 DIAGNOSIS — I10 Essential (primary) hypertension: Secondary | ICD-10-CM | POA: Diagnosis not present

## 2022-12-31 DIAGNOSIS — E440 Moderate protein-calorie malnutrition: Secondary | ICD-10-CM | POA: Diagnosis not present

## 2022-12-31 DIAGNOSIS — R7881 Bacteremia: Secondary | ICD-10-CM | POA: Diagnosis not present

## 2022-12-31 DIAGNOSIS — M4646 Discitis, unspecified, lumbar region: Secondary | ICD-10-CM | POA: Diagnosis not present

## 2022-12-31 DIAGNOSIS — I330 Acute and subacute infective endocarditis: Secondary | ICD-10-CM | POA: Diagnosis not present

## 2022-12-31 DIAGNOSIS — Z87891 Personal history of nicotine dependence: Secondary | ICD-10-CM

## 2022-12-31 DIAGNOSIS — I4891 Unspecified atrial fibrillation: Secondary | ICD-10-CM | POA: Diagnosis not present

## 2022-12-31 DIAGNOSIS — B9561 Methicillin susceptible Staphylococcus aureus infection as the cause of diseases classified elsewhere: Secondary | ICD-10-CM | POA: Diagnosis not present

## 2022-12-31 HISTORY — PX: TEE WITHOUT CARDIOVERSION: SHX5443

## 2022-12-31 LAB — CBC WITH DIFFERENTIAL/PLATELET
Abs Immature Granulocytes: 0.27 10*3/uL — ABNORMAL HIGH (ref 0.00–0.07)
Basophils Absolute: 0 10*3/uL (ref 0.0–0.1)
Basophils Relative: 0 %
Eosinophils Absolute: 0 10*3/uL (ref 0.0–0.5)
Eosinophils Relative: 0 %
HCT: 33.6 % — ABNORMAL LOW (ref 39.0–52.0)
Hemoglobin: 10.4 g/dL — ABNORMAL LOW (ref 13.0–17.0)
Immature Granulocytes: 2 %
Lymphocytes Relative: 12 %
Lymphs Abs: 1.8 10*3/uL (ref 0.7–4.0)
MCH: 25 pg — ABNORMAL LOW (ref 26.0–34.0)
MCHC: 31 g/dL (ref 30.0–36.0)
MCV: 80.8 fL (ref 80.0–100.0)
Monocytes Absolute: 0.7 10*3/uL (ref 0.1–1.0)
Monocytes Relative: 5 %
Neutro Abs: 12 10*3/uL — ABNORMAL HIGH (ref 1.7–7.7)
Neutrophils Relative %: 81 %
Platelets: 158 10*3/uL (ref 150–400)
RBC: 4.16 MIL/uL — ABNORMAL LOW (ref 4.22–5.81)
RDW: 16.1 % — ABNORMAL HIGH (ref 11.5–15.5)
WBC: 14.8 10*3/uL — ABNORMAL HIGH (ref 4.0–10.5)
nRBC: 0 % (ref 0.0–0.2)

## 2022-12-31 LAB — COMPREHENSIVE METABOLIC PANEL
ALT: 15 U/L (ref 0–44)
AST: 17 U/L (ref 15–41)
Albumin: <1.5 g/dL — ABNORMAL LOW (ref 3.5–5.0)
Alkaline Phosphatase: 80 U/L (ref 38–126)
Anion gap: 8 (ref 5–15)
BUN: 17 mg/dL (ref 8–23)
CO2: 28 mmol/L (ref 22–32)
Calcium: 7.3 mg/dL — ABNORMAL LOW (ref 8.9–10.3)
Chloride: 104 mmol/L (ref 98–111)
Creatinine, Ser: 0.65 mg/dL (ref 0.61–1.24)
GFR, Estimated: >60 mL/min (ref >60)
Glucose, Bld: 236 mg/dL — ABNORMAL HIGH (ref 70–99)
Potassium: 3.5 mmol/L (ref 3.5–5.1)
Sodium: 140 mmol/L (ref 135–145)
Total Bilirubin: 0.8 mg/dL (ref 0.3–1.2)
Total Protein: 4.9 g/dL — ABNORMAL LOW (ref 6.5–8.1)

## 2022-12-31 LAB — GLUCOSE, CAPILLARY
Glucose-Capillary: 181 mg/dL — ABNORMAL HIGH (ref 70–99)
Glucose-Capillary: 198 mg/dL — ABNORMAL HIGH (ref 70–99)
Glucose-Capillary: 199 mg/dL — ABNORMAL HIGH (ref 70–99)
Glucose-Capillary: 200 mg/dL — ABNORMAL HIGH (ref 70–99)
Glucose-Capillary: 210 mg/dL — ABNORMAL HIGH (ref 70–99)
Glucose-Capillary: 259 mg/dL — ABNORMAL HIGH (ref 70–99)
Glucose-Capillary: 265 mg/dL — ABNORMAL HIGH (ref 70–99)
Glucose-Capillary: 291 mg/dL — ABNORMAL HIGH (ref 70–99)

## 2022-12-31 LAB — ECHO TEE

## 2022-12-31 LAB — MAGNESIUM: Magnesium: 2 mg/dL (ref 1.7–2.4)

## 2022-12-31 SURGERY — ECHOCARDIOGRAM, TRANSESOPHAGEAL
Anesthesia: Monitor Anesthesia Care

## 2022-12-31 MED ORDER — LACTATED RINGERS IV SOLN: INTRAVENOUS | Status: DC

## 2022-12-31 MED ORDER — FUROSEMIDE 10 MG/ML IJ SOLN
40.0000 mg | Freq: Two times a day (BID) | INTRAMUSCULAR | Status: DC
  Administered 2022-12-31 – 2023-01-07 (×15): 40 mg via INTRAVENOUS
  Filled 2022-12-31 (×15): qty 4

## 2022-12-31 MED ORDER — HEPARIN (PORCINE) 25000 UT/250ML-% IV SOLN
2700.0000 [IU]/h | INTRAVENOUS | Status: AC
  Administered 2022-12-31: 1300 [IU]/h via INTRAVENOUS
  Administered 2023-01-02: 2350 [IU]/h via INTRAVENOUS
  Administered 2023-01-02: 1900 [IU]/h via INTRAVENOUS
  Administered 2023-01-03: 2450 [IU]/h via INTRAVENOUS
  Filled 2022-12-31 (×6): qty 250

## 2022-12-31 MED ORDER — INSULIN GLARGINE-YFGN 100 UNIT/ML ~~LOC~~ SOLN
15.0000 [IU] | Freq: Every day | SUBCUTANEOUS | Status: DC
  Administered 2022-12-31 – 2023-01-27 (×28): 15 [IU] via SUBCUTANEOUS
  Filled 2022-12-31 (×29): qty 0.15

## 2022-12-31 MED ORDER — PROPOFOL 10 MG/ML IV BOLUS
INTRAVENOUS | Status: DC | PRN
  Administered 2022-12-31: 75 ug/kg/min via INTRAVENOUS
  Administered 2022-12-31: 40 mg via INTRAVENOUS

## 2022-12-31 MED ORDER — CEFAZOLIN SODIUM-DEXTROSE 2-4 GM/100ML-% IV SOLN
2.0000 g | Freq: Three times a day (TID) | INTRAVENOUS | Status: DC
  Administered 2023-01-11 – 2023-01-28 (×52): 2 g via INTRAVENOUS
  Filled 2022-12-31 (×52): qty 100

## 2022-12-31 MED ORDER — LIDOCAINE 2% (20 MG/ML) 5 ML SYRINGE
INTRAMUSCULAR | Status: DC | PRN
  Administered 2022-12-31: 100 mg via INTRAVENOUS

## 2022-12-31 MED ORDER — PHENYLEPHRINE HCL (PRESSORS) 10 MG/ML IV SOLN
INTRAVENOUS | Status: DC | PRN
  Administered 2022-12-31 (×4): 160 ug via INTRAVENOUS

## 2022-12-31 NOTE — Progress Notes (Signed)
[facility]    Date of Admission:  12/24/2022   Total days of antibiotics 8/ day 4 nafcillin          ID: [name] is a 72 y.o. male with  disseminated MSSA infection -MRI findings most consistent with Acute Vertebral Osteonecrosis, infarction of the L4 and L5 vertebral bodies., and ventriculitis Principal Problem:   Discitis of lumbar region Active Problems:   Diabetes mellitus type 2 in nonobese [facility])   Dyslipidemia   Essential hypertension   New onset atrial fibrillation (HCC)   Sepsis (HCC)   BPH (benign prostatic hyperplasia)   Mood disorder (HCC)   MSSA bacteremia    Subjective: Afebrile, underwent TEE which ruled out endocarditis. Still has swelling to right hand.wrist. tender with flexion  Medications:   acetaminophen  500 mg Oral TID   amiodarone  400 mg Oral BID   Followed by   Melene Muller ON 01/08/2023] amiodarone  200 mg Oral Daily   azelastine  1 spray Each Nare BID   Chlorhexidine Gluconate Cloth  6 each Topical Daily   enoxaparin (LOVENOX) injection  40 mg Subcutaneous Q24H   escitalopram  10 mg Oral Daily   fluticasone  2 spray Each Nare Daily   gabapentin  100 mg Oral BID   insulin aspart  0-15 Units Subcutaneous TID WC   insulin aspart  0-5 Units Subcutaneous QHS   insulin glargine-yfgn  10 Units Subcutaneous QHS   ketotifen  1 drop Left Eye BID   lidocaine  2 patch Transdermal Q24H   loratadine  10 mg Oral Daily   methocarbamol  500 mg Oral TID   pantoprazole  40 mg Oral BID   rosuvastatin  20 mg Oral Daily   sodium chloride flush  10-40 mL Intracatheter Q12H   sodium chloride flush  3 mL Intravenous Q12H   tamsulosin  0.4 mg Oral Daily    Objective: Vital signs in last 24 hours: Temp:  [97.5 F (36.4 C)-99.7 F (37.6 C)] 98.7 F (37.1 C) (06/04 1109) Pulse Rate:  [66-97] 69 (06/04 1109) Resp:  [17-29] 21 (06/04 1020) BP: (88-144)/(49-72) 119/61 (06/04 1109) SpO2:  [92 %-99 %] 98 % (06/04 1109)  Physical  Exam  Constitutional: He is oriented to person, place, and time. He appears well-developed and well-nourished. No distress.  HENT:  Mouth/Throat: Oropharynx is clear and moist. No oropharyngeal exudate.  Cardiovascular: Normal rate, regular rhythm and normal heart sounds. Exam reveals no gallop and no friction rub.  No murmur heard.  Pulmonary/Chest: Effort normal and breath sounds normal. No respiratory distress. He has no wheezes.  Abdominal: Soft. Bowel sounds are normal. He exhibits no distension. There is no tenderness.  Lymphadenopathy:  He has no cervical adenopathy.  Neurological: He is alert and oriented to person, place, and time.  Skin: Skin is warm and dry. No rash noted. No erythema.  Ext: left hand/wrist sweling > right hand Psychiatric: He has a normal mood and affect. His behavior is normal.    Lab Results Recent Labs    12/30/22 0150 12/31/22 0450  WBC 13.4* 14.8*  HGB 11.2* 10.4*  HCT 36.4* 33.6*  NA 138 140  K 3.3* 3.5  CL 101 104  CO2 31 28  BUN 21 17  CREATININE 0.62 0.65   Liver Panel Recent Labs    12/30/22 0150 12/31/22 0450  PROT 5.0* 4.9*  ALBUMIN <1.5* <1.5*  AST 18 17  ALT 14 15  ALKPHOS  89 80  BILITOT 0.9 0.8   Lab Results  Component Value Date   ESRSEDRATE 43 (H) 12/24/2022    Microbiology: 5/28 blood cx ngtd Studies/Results: ECHO TEE  Result Date: 12/31/2022    TRANSESOPHOGEAL ECHO REPORT   Patient Name:   Jim Ramirez Date of Exam: 12/31/2022 Medical Rec #:  [id number]        Height:       72.0 in Accession #:    [id number]       Weight:       208.0 lb Date of Birth:  09/29/1950        BSA:          2.166 m Patient Age:    72 years         BP:           114/72 mmHg Patient Gender: M                HR:           78 bpm. Exam Location:  Inpatient Procedure: Transesophageal Echo, Color Doppler and Cardiac Doppler Indications:    Bacteremia  History:        Patient has prior history of Echocardiogram examinations, most                  recent 12/25/2022. Signs/Symptoms:Bacteremia; Risk                 Factors:Hypertension, Diabetes, Dyslipidemia and Sleep Apnea.  Sonographer:    Milbert Coulter Referring Phys: [id number] Jonita Albee PROCEDURE: After discussion of the risks and benefits of a TEE, an informed consent was obtained from the patient. The transesophogeal probe was passed without difficulty through the esophogus of the patient. Imaged were obtained with the patient in a left lateral decubitus position. Sedation performed by different physician. The patient was monitored while under deep sedation. Anesthestetic sedation was provided intravenously by Anesthesiology: 182.5mg  of Propofol, 100mg  of Lidocaine. Image quality was good. The patient's vital signs; including heart rate, blood pressure, and oxygen saturation; remained stable throughout the procedure. The patient developed no complications during the procedure.  IMPRESSIONS  1. Left ventricular ejection fraction, by estimation, is 60 to 65%. The left ventricle has normal function.  2. Right ventricular systolic function is normal. The right ventricular size is normal.  3. No left atrial/left atrial appendage thrombus was detected.  4. The mitral valve is normal in structure. Trivial mitral valve regurgitation.  5. The aortic valve is tricuspid. Aortic valve regurgitation is not visualized. No aortic stenosis is present. Conclusion(s)/Recommendation(s): No evidence of vegetation/infective endocarditis on this transesophageael echocardiogram. FINDINGS  Left Ventricle: Left ventricular ejection fraction, by estimation, is 60 to 65%. The left ventricle has normal function. The left ventricular internal cavity size was normal in size. Right Ventricle: The right ventricular size is normal. No increase in right ventricular wall thickness. Right ventricular systolic function is normal. Left Atrium: Left atrial size was normal in size. No left atrial/left atrial appendage thrombus was  detected. Right Atrium: Right atrial size was normal in size. Pericardium: There is no evidence of pericardial effusion. Mitral Valve: The mitral valve is normal in structure. Trivial mitral valve regurgitation. Tricuspid Valve: The tricuspid valve is normal in structure. Tricuspid valve regurgitation is trivial. Aortic Valve: The aortic valve is tricuspid. Aortic valve regurgitation is not visualized. No aortic stenosis is present. Pulmonic Valve: The pulmonic valve was grossly normal. Pulmonic valve regurgitation is trivial. Aorta: The aortic  root and ascending aorta are structurally normal, with no evidence of dilitation. IAS/Shunts: No atrial level shunt detected by color flow Doppler. Additional Comments: Spectral Doppler performed. Epifanio Lesches MD Electronically signed by Epifanio Lesches MD Signature Date/Time: 12/31/2022/11:08:06 AM    Final    EP STUDY  Result Date: 12/31/2022 See surgical note for result.  VAS [place] UPPER EXTREMITY VENOUS DUPLEX  Result Date: 12/30/2022 UPPER VENOUS STUDY  Patient Name:  Jim Ramirez  Date of Exam:   12/30/2022 Medical Rec #: [id number]         Accession #:    [id number] Date of Birth: 19-May-1951         Patient Gender: M Patient Age:   67 years Exam Location:  [facility] Procedure:      VAS [place] UPPER EXTREMITY VENOUS DUPLEX Referring Phys: PRANAV PATEL --------------------------------------------------------------------------------  Indications: Pain, and Swelling Anticoagulation: Lovenox. Comparison Study: Previous RUE 12/28/22 superficial in basilic.                   No previous LUE. Performing Technologist: McKayla Maag RVT, VT  Examination Guidelines: A complete evaluation includes B-mode imaging, spectral Doppler, color Doppler, and power Doppler as needed of all accessible portions of each vessel. Bilateral testing is considered an integral part of a complete examination. Limited examinations for reoccurring indications may be performed as noted.   Right Findings: +----------+------------+---------+-----------+----------+-------+ RIGHT     CompressiblePhasicitySpontaneousPropertiesSummary +----------+------------+---------+-----------+----------+-------+ IJV           Full       Yes       Yes                      +----------+------------+---------+-----------+----------+-------+ Subclavian    Full       Yes       Yes                      +----------+------------+---------+-----------+----------+-------+ Axillary      Full       Yes       Yes                      +----------+------------+---------+-----------+----------+-------+ Brachial      Full       Yes       Yes                      +----------+------------+---------+-----------+----------+-------+ Radial        Full                                          +----------+------------+---------+-----------+----------+-------+ Ulnar         Full                                          +----------+------------+---------+-----------+----------+-------+ Cephalic      Full                                          +----------+------------+---------+-----------+----------+-------+ Basilic     Partial      Yes       Yes  Acute  +----------+------------+---------+-----------+----------+-------+ Superficial thrombus in the right basilic vein of the proximal upper arm is noted.  Left Findings: +----------+------------+---------+-----------+----------+-------+ LEFT      CompressiblePhasicitySpontaneousPropertiesSummary +----------+------------+---------+-----------+----------+-------+ IJV           Full       Yes       Yes                      +----------+------------+---------+-----------+----------+-------+ Subclavian    Full       Yes       Yes                      +----------+------------+---------+-----------+----------+-------+ Axillary      Full       Yes       Yes                       +----------+------------+---------+-----------+----------+-------+ Brachial      Full       Yes       Yes                      +----------+------------+---------+-----------+----------+-------+ Radial        Full                                          +----------+------------+---------+-----------+----------+-------+ Ulnar         Full                                          +----------+------------+---------+-----------+----------+-------+ Cephalic    Partial      No        No                Acute  +----------+------------+---------+-----------+----------+-------+ Basilic     Partial      No        No                Acute  +----------+------------+---------+-----------+----------+-------+ Superficial thrombus in the left basilic vein of the distal upper arm to wrist is noted. As well as superficial thrombus in the left cephalic vein of the mid to distal forearm.  Summary:  Right: No evidence of deep vein thrombosis in the upper extremity. Findings consistent with acute superficial vein thrombosis involving the right basilic vein.  Left: No evidence of deep vein thrombosis in the upper extremity. Findings consistent with acute superficial vein thrombosis involving the left basilic vein and left cephalic vein.  *See table(s) above for measurements and observations.  Diagnosing physician: Lemar Livings MD Electronically signed by Lemar Livings MD on 12/30/2022 at 4:41:20 PM.    Final    MRI of hand IMPRESSION: 1. Lobulated fluid collection at the edge of the field of view measuring approximately 3.0 x 1.4 x 2.2 cm. It is unclear if this collection is associated with the traversing tendon sheaths or represents a intramuscular collection associated with the adductor pollicis muscle. In the setting of infection, appearance is most suggestive of an abscess or focal tenosynovitis. Assessment/Plan: MSSA disseminated infection = continue on nafcillin. Plan to do 14 days of  nafcillin then finish out course with cefazolin 2gm iv q8 hr to complete 8 wk of treatment  Concern for source  control of right wrist. Possible abscess formation. Recommend to repeat imaging of right wrist to see if evolving and need for surgical debridement  Moderate protein calorie malnutrition = continue with calorie count/supplementation to meet caloric intake needs  [facility] for Infectious Diseases Pager: [phone]  12/31/2022, 2:21 PM

## 2022-12-31 NOTE — Plan of Care (Signed)
  Problem: Clinical Measurements: Goal: Diagnostic test results will improve Outcome: Progressing Goal: Respiratory complications will improve Outcome: Progressing Goal: Cardiovascular complication will be avoided Outcome: Progressing   Problem: Coping: Goal: Level of anxiety will decrease Outcome: Progressing   Problem: Pain Managment: Goal: General experience of comfort will improve Outcome: Progressing   Problem: Safety: Goal: Ability to remain free from injury will improve Outcome: Progressing   

## 2022-12-31 NOTE — Progress Notes (Signed)
Subjective: The patient is alert and pleasant.  He continues complain of back and leg pain.  Objective: Vital signs in last 24 hours: Temp:  [97.5 F (36.4 C)-99.7 F (37.6 C)] 98.6 F (37 C) (06/04 0450) Pulse Rate:  [79-97] 83 (06/03 2305) Resp:  [17-29] 22 (06/04 0450) BP: (100-158)/(49-60) 129/52 (06/04 0450) SpO2:  [92 %-96 %] 95 % (06/04 0450) Estimated body mass index is 28.21 kg/m as calculated from the following:   Height as of 10/07/22: 6' (1.829 m).   Weight as of 10/07/22: 94.3 kg.   Intake/Output from previous day: 06/03 0701 - 06/04 0700 In: 1568.3 [P.O.:960; I.V.:28.6; IV Piggyback:579.7] Out: 800 [Urine:800] Intake/Output this shift: No intake/output data recorded.  Physical exam the patient is alert pleasant and oriented.  His lower extremity strength is grossly normal.  I reviewed the patient's brain MRI performed 12/27/2022.  It is unremarkable.  Lab Results: Recent Labs    12/30/22 0150 12/31/22 0450  WBC 13.4* 14.8*  HGB 11.2* 10.4*  HCT 36.4* 33.6*  PLT 120* 158   BMET Recent Labs    12/30/22 0150 12/31/22 0450  NA 138 140  K 3.3* 3.5  CL 101 104  CO2 31 28  GLUCOSE 219* 236*  BUN 21 17  CREATININE 0.62 0.65  CALCIUM 7.4* 7.3*    Studies/Results: VAS [place] UPPER EXTREMITY VENOUS DUPLEX  Result Date: 12/30/2022 UPPER VENOUS STUDY  Patient Name:  Jim Ramirez  Date of Exam:   12/30/2022 Medical Rec #: [id number]         Accession #:    [id number] Date of Birth: 1950/11/16         Patient Gender: M Patient Age:   38 years Exam Location:  [facility] Procedure:      VAS [place] UPPER EXTREMITY VENOUS DUPLEX Referring Phys: PRANAV PATEL --------------------------------------------------------------------------------  Indications: Pain, and Swelling Anticoagulation: Lovenox. Comparison Study: Previous RUE 12/28/22 superficial in basilic.                   No previous LUE. Performing Technologist: McKayla Maag RVT, [place]  Examination Guidelines: A  complete evaluation includes B-mode imaging, spectral Doppler, color Doppler, and power Doppler as needed of all accessible portions of each vessel. Bilateral testing is considered an integral part of a complete examination. Limited examinations for reoccurring indications may be performed as noted.  Right Findings: +----------+------------+---------+-----------+----------+-------+ RIGHT     CompressiblePhasicitySpontaneousPropertiesSummary +----------+------------+---------+-----------+----------+-------+ IJV           Full       Yes       Yes                      +----------+------------+---------+-----------+----------+-------+ Subclavian    Full       Yes       Yes                      +----------+------------+---------+-----------+----------+-------+ Axillary      Full       Yes       Yes                      +----------+------------+---------+-----------+----------+-------+ Brachial      Full       Yes       Yes                      +----------+------------+---------+-----------+----------+-------+ Radial  Full                                          +----------+------------+---------+-----------+----------+-------+ Ulnar         Full                                          +----------+------------+---------+-----------+----------+-------+ Cephalic      Full                                          +----------+------------+---------+-----------+----------+-------+ Basilic     Partial      Yes       Yes               Acute  +----------+------------+---------+-----------+----------+-------+ Superficial thrombus in the right basilic vein of the proximal upper arm is noted.  Left Findings: +----------+------------+---------+-----------+----------+-------+ LEFT      CompressiblePhasicitySpontaneousPropertiesSummary +----------+------------+---------+-----------+----------+-------+ IJV           Full       Yes       Yes                       +----------+------------+---------+-----------+----------+-------+ Subclavian    Full       Yes       Yes                      +----------+------------+---------+-----------+----------+-------+ Axillary      Full       Yes       Yes                      +----------+------------+---------+-----------+----------+-------+ Brachial      Full       Yes       Yes                      +----------+------------+---------+-----------+----------+-------+ Radial        Full                                          +----------+------------+---------+-----------+----------+-------+ Ulnar         Full                                          +----------+------------+---------+-----------+----------+-------+ Cephalic    Partial      No        No                Acute  +----------+------------+---------+-----------+----------+-------+ Basilic     Partial      No        No                Acute  +----------+------------+---------+-----------+----------+-------+ Superficial thrombus in the left basilic vein of the distal upper arm to wrist is noted. As well as superficial thrombus in the left cephalic vein of the mid to distal forearm.  Summary:  Right: No evidence of deep  vein thrombosis in the upper extremity. Findings consistent with acute superficial vein thrombosis involving the right basilic vein.  Left: No evidence of deep vein thrombosis in the upper extremity. Findings consistent with acute superficial vein thrombosis involving the left basilic vein and left cephalic vein.  *See table(s) above for measurements and observations.  Diagnosing physician: Lemar Livings MD Electronically signed by Lemar Livings MD on 12/30/2022 at 4:41:20 PM.    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  I recommend continued lumbosacral corset when he is out of bed and outpatient follow-up.  LOS:  7 days     Jim Ramirez 12/31/2022, 7:44 AM     Patient ID: Jim Ramirez, male   DOB: February 20, 1951, 72 y.o.   MRN: [id number]

## 2022-12-31 NOTE — Anesthesia Preprocedure Evaluation (Addendum)
Anesthesia Evaluation  Patient identified by MRN, date of birth, ID band Patient awake    Reviewed: Allergy & Precautions, NPO status , Patient's Chart, lab work & pertinent test results, reviewed documented beta blocker date and time   History of Anesthesia Complications Negative for: history of anesthetic complications  Airway Mallampati: II  TM Distance: >3 FB Neck ROM: Full    Dental  (+) Edentulous Upper, Edentulous Lower   Pulmonary sleep apnea , former smoker    + decreased breath sounds      Cardiovascular hypertension, Pt. on home beta blockers and Pt. on medications + DVT  + dysrhythmias Atrial Fibrillation  Rhythm:Irregular Rate:Normal   '24 TTE - Difficult study. EF 65 to 70%. Indeterminate diastolic filling due to E-A fusion.   Right ventricular systolic function is hyperdynamic.      Neuro/Psych  PSYCHIATRIC DISORDERS  Depression     Hx SAH     GI/Hepatic Neg liver ROS, PUD,,,  Endo/Other  diabetes, Type 2, Oral Hypoglycemic Agents   Ca 7.3   Renal/GU negative Renal ROS     Musculoskeletal  (+) Arthritis ,    Abdominal   Peds  Hematology  (+) Blood dyscrasia, anemia   Anesthesia Other Findings Staph bacteremia, osteomyelitis/discitis   Reproductive/Obstetrics                              Anesthesia Physical Anesthesia Plan  ASA: 4  Anesthesia Plan: MAC   Post-op Pain Management: Minimal or no pain anticipated   Induction:   PONV Risk Score and Plan: 1 and Propofol infusion and Treatment may vary due to age or medical condition  Airway Management Planned: Nasal Cannula and Natural Airway  Additional Equipment: None  Intra-op Plan:   Post-operative Plan:   Informed Consent: I have reviewed the patients History and Physical, chart, labs and discussed the procedure including the risks, benefits and alternatives for the proposed anesthesia with the patient  or authorized representative who has indicated his/her understanding and acceptance.   Patient has DNR.  Discussed DNR with patient and Suspend DNR.     Plan Discussed with: CRNA and Anesthesiologist  Anesthesia Plan Comments:          Anesthesia Quick Evaluation

## 2022-12-31 NOTE — H&P (View-Only) (Signed)
Cardiology Progress Note  Patient ID: Jim Ramirez MRN: [id number] DOB: 08/18/1950 Date of Encounter: 12/31/2022  Primary Cardiologist: Dina Rich, MD  Subjective   Chief Complaint: None.   HPI: N.p.o. for transesophageal echocardiogram this morning.  Reports no chest pains or trouble breathing.  ROS:  All other ROS reviewed and negative. Pertinent positives noted in the HPI.     Inpatient Medications  Scheduled Meds:  acetaminophen  500 mg Oral TID   amiodarone  400 mg Oral BID   Followed by   Melene Muller ON 01/08/2023] amiodarone  200 mg Oral Daily   azelastine  1 spray Each Nare BID   Chlorhexidine Gluconate Cloth  6 each Topical Daily   enoxaparin (LOVENOX) injection  40 mg Subcutaneous Q24H   escitalopram  10 mg Oral Daily   fluticasone  2 spray Each Nare Daily   gabapentin  100 mg Oral BID   insulin aspart  0-15 Units Subcutaneous TID WC   insulin aspart  0-5 Units Subcutaneous QHS   insulin glargine-yfgn  10 Units Subcutaneous QHS   ketotifen  1 drop Left Eye BID   lidocaine  2 patch Transdermal Q24H   loratadine  10 mg Oral Daily   methocarbamol  500 mg Oral TID   pantoprazole  40 mg Oral BID   rosuvastatin  20 mg Oral Daily   sodium chloride flush  10-40 mL Intracatheter Q12H   sodium chloride flush  3 mL Intravenous Q12H   tamsulosin  0.4 mg Oral Daily   Continuous Infusions:  sodium chloride 20 mL/hr at 12/31/22 0450   nafcillin 12 g in sodium chloride 0.9 % 500 mL continuous infusion 20.8 mL/hr at 12/30/22 1901   PRN Meds: acetaminophen **OR** acetaminophen, levalbuterol, metoprolol tartrate, morphine injection, ondansetron **OR** ondansetron (ZOFRAN) IV, oxyCODONE, sodium chloride flush, zolpidem   Vital Signs   Vitals:   12/31/22 0003 12/31/22 0004 12/31/22 0450 12/31/22 0744  BP: (!) 122/49  (!) 129/52 (!) 144/59  Pulse:    77  Resp: 20  (!) 22 (!) 21  Temp:   98.6 F (37 C) 99.2 F (37.3 C)  TempSrc:   Oral Axillary  SpO2:  92% 95% 96%     Intake/Output Summary (Last 24 hours) at 12/31/2022 0856 Last data filed at 12/31/2022 0617 Gross per 24 hour  Intake 1302.8 ml  Output 800 ml  Net 502.8 ml      10/07/2022   10:36 AM 09/30/2022   11:04 AM 09/23/2022   10:38 AM  Last 3 Weights  Weight (lbs) 208 lb 207 lb 207 lb  Weight (kg) 94.348 kg 93.895 kg 93.895 kg      Telemetry  Overnight telemetry shows sinus rhythm 70s, which I personally reviewed.   Physical Exam   Vitals:   12/31/22 0003 12/31/22 0004 12/31/22 0450 12/31/22 0744  BP: (!) 122/49  (!) 129/52 (!) 144/59  Pulse:    77  Resp: 20  (!) 22 (!) 21  Temp:   98.6 F (37 C) 99.2 F (37.3 C)  TempSrc:   Oral Axillary  SpO2:  92% 95% 96%    Intake/Output Summary (Last 24 hours) at 12/31/2022 0856 Last data filed at 12/31/2022 0617 Gross per 24 hour  Intake 1302.8 ml  Output 800 ml  Net 502.8 ml       10/07/2022   10:36 AM 09/30/2022   11:04 AM 09/23/2022   10:38 AM  Last 3 Weights  Weight (lbs) 208 lb 207 lb 207  lb  Weight (kg) 94.348 kg 93.895 kg 93.895 kg    There is no height or weight on file to calculate BMI.  General: Well nourished, well developed, in no acute distress Head: Atraumatic, normal size  Eyes: PEERLA, EOMI  Neck: Supple, no JVD Endocrine: No thryomegaly Cardiac: Normal S1, S2; RRR; no murmurs, rubs, or gallops Lungs: Clear to auscultation bilaterally, no wheezing, rhonchi or rales  Abd: Soft, nontender, no hepatomegaly  Ext: No edema, pulses 2+ Musculoskeletal: No deformities, BUE and BLE strength normal and equal Skin: Warm and dry, no rashes   Neuro: Alert and oriented to person, place, time, and situation, CNII-XII grossly intact, no focal deficits  Psych: Normal mood and affect   Labs  High Sensitivity Troponin:   Recent Labs  Lab 12/24/22 1646 12/24/22 1759  TROPONINIHS 227* 181*     Cardiac EnzymesNo results for input(s): "TROPONINI" in the last 168 hours. No results for input(s): "TROPIPOC" in the last 168 hours.   Chemistry Recent Labs  Lab 12/29/22 1051 12/30/22 0150 12/31/22 0450  NA 139 138 140  K 3.8 3.3* 3.5  CL 103 101 104  CO2 28 31 28   GLUCOSE 319* 219* 236*  BUN 24* 21 17  CREATININE 0.73 0.62 0.65  CALCIUM 7.6* 7.4* 7.3*  PROT 4.8* 5.0* 4.9*  ALBUMIN <1.5* <1.5* <1.5*  AST 24 18 17   ALT 17 14 15   ALKPHOS 97 89 80  BILITOT 1.2 0.9 0.8  GFRNONAA >60 >60 >60  ANIONGAP 8 6 8     Hematology Recent Labs  Lab 12/29/22 1051 12/30/22 0150 12/31/22 0450  WBC 12.5* 13.4* 14.8*  RBC 4.59 4.51 4.16*  HGB 11.7* 11.2* 10.4*  HCT 36.6* 36.4* 33.6*  MCV 79.7* 80.7 80.8  MCH 25.5* 24.8* 25.0*  MCHC 32.0 30.8 31.0  RDW 15.9* 16.2* 16.1*  PLT 112* 120* 158   BNPNo results for input(s): "BNP", "PROBNP" in the last 168 hours.  DDimer No results for input(s): "DDIMER" in the last 168 hours.   Radiology  VAS [place] UPPER EXTREMITY VENOUS DUPLEX  Result Date: 12/30/2022 UPPER VENOUS STUDY  Patient Name:  Jim Ramirez  Date of Exam:   12/30/2022 Medical Rec #: [id number]         Accession #:    [id number] Date of Birth: Jan 24, 1951         Patient Gender: M Patient Age:   72 years Exam Location:  [facility] Procedure:      VAS [place] UPPER EXTREMITY VENOUS DUPLEX Referring Phys: PRANAV PATEL --------------------------------------------------------------------------------  Indications: Pain, and Swelling Anticoagulation: Lovenox. Comparison Study: Previous RUE 12/28/22 superficial in basilic.                   No previous LUE. Performing Technologist: McKayla Maag RVT, [place]  Examination Guidelines: A complete evaluation includes B-mode imaging, spectral Doppler, color Doppler, and power Doppler as needed of all accessible portions of each vessel. Bilateral testing is considered an integral part of a complete examination. Limited examinations for reoccurring indications may be performed as noted.  Right Findings: +----------+------------+---------+-----------+----------+-------+ RIGHT      CompressiblePhasicitySpontaneousPropertiesSummary +----------+------------+---------+-----------+----------+-------+ IJV           Full       Yes       Yes                      +----------+------------+---------+-----------+----------+-------+ Subclavian    Full       Yes  Yes                      +----------+------------+---------+-----------+----------+-------+ Axillary      Full       Yes       Yes                      +----------+------------+---------+-----------+----------+-------+ Brachial      Full       Yes       Yes                      +----------+------------+---------+-----------+----------+-------+ Radial        Full                                          +----------+------------+---------+-----------+----------+-------+ Ulnar         Full                                          +----------+------------+---------+-----------+----------+-------+ Cephalic      Full                                          +----------+------------+---------+-----------+----------+-------+ Basilic     Partial      Yes       Yes               Acute  +----------+------------+---------+-----------+----------+-------+ Superficial thrombus in the right basilic vein of the proximal upper arm is noted.  Left Findings: +----------+------------+---------+-----------+----------+-------+ LEFT      CompressiblePhasicitySpontaneousPropertiesSummary +----------+------------+---------+-----------+----------+-------+ IJV           Full       Yes       Yes                      +----------+------------+---------+-----------+----------+-------+ Subclavian    Full       Yes       Yes                      +----------+------------+---------+-----------+----------+-------+ Axillary      Full       Yes       Yes                      +----------+------------+---------+-----------+----------+-------+ Brachial      Full       Yes       Yes                       +----------+------------+---------+-----------+----------+-------+ Radial        Full                                          +----------+------------+---------+-----------+----------+-------+ Ulnar         Full                                          +----------+------------+---------+-----------+----------+-------+  Cephalic    Partial      No        No                Acute  +----------+------------+---------+-----------+----------+-------+ Basilic     Partial      No        No                Acute  +----------+------------+---------+-----------+----------+-------+ Superficial thrombus in the left basilic vein of the distal upper arm to wrist is noted. As well as superficial thrombus in the left cephalic vein of the mid to distal forearm.  Summary:  Right: No evidence of deep vein thrombosis in the upper extremity. Findings consistent with acute superficial vein thrombosis involving the right basilic vein.  Left: No evidence of deep vein thrombosis in the upper extremity. Findings consistent with acute superficial vein thrombosis involving the left basilic vein and left cephalic vein.  *See table(s) above for measurements and observations.  Diagnosing physician: Lemar Livings MD Electronically signed by Lemar Livings MD on 12/30/2022 at 4:41:20 PM.    Final     Cardiac Studies  TTE 12/25/2022  1. Left ventricular ejection fraction, by estimation, is 65 to 70%. The  left ventricle has normal function. Left ventricular endocardial border  not optimally defined to evaluate regional wall motion. Indeterminate  diastolic filling due to E-A fusion.   2. Right ventricular systolic function is hyperdynamic. The right  ventricular size is not well visualized. Tricuspid regurgitation signal is  inadequate for assessing PA pressure.   3. The mitral valve was not well visualized. No evidence of mitral valve  regurgitation.   4. The aortic valve was not well visualized. Aortic  valve regurgitation  is not visualized. No aortic stenosis is present.   5. The inferior vena cava is dilated in size with >50% respiratory  variability, suggesting right atrial pressure of 8 mmHg.   6. Technically difficult study.   Patient Profile  Jim Ramirez is a 72 y.o. male with diabetes, subarachnoid hemorrhage who was admitted on 12/22/2022 with concerns for lumbar spine osteomyelitis and MSSA bacteremia.  Course also complicated by atrial fibrillation with RVR.  Cardiology consulted for further recommendations.   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  Need to exclude endocarditis.  No contraindication to the procedure.  Risk and benefits reviewed.  He is willing to proceed.  Shared Decision Making/Informed Consent The risks [esophageal damage, perforation (1:10,000 risk), bleeding, pharyngeal hematoma as well as other potential complications associated with conscious sedation including aspiration, arrhythmia, respiratory failure and death], benefits (treatment guidance and diagnostic support) and alternatives of a transesophageal echocardiogram were discussed in detail with Mr. Smaw and he is willing to proceed.   # New onset A-fib -Secondary to sepsis.  Thyroid studies are normal.  Echo shows normal LV function. -Has converted back to sinus rhythm.  Was placed on amiodarone by the initial cardiology rounding service. -Will continue with a brief course of oral amiodarone.  His A-fib is driven by sepsis.  A short course of amiodarone will keep him in rhythm while he recovers. -Platelets were low.  Seem to be recovered.  Would recommend to start anticoagulation when you are able.  He does have evidence of possible ventriculitis and lumbar spine osteomyelitis.  We will await neurology and neurosurgery input on [facility] safety.  For questions or updates, please  contact [facility] Please consult [email or web address] for contact info under        Signed, Gerri Spore T. Flora Lipps, MD, [facility]  [facility]  12/31/2022 8:56 AM

## 2022-12-31 NOTE — Progress Notes (Signed)
Cardiology Progress Note  Patient ID: Jim Ramirez MRN: [id number] DOB: 02/22/1951 Date of Encounter: 12/31/2022  Primary Cardiologist: Branch, Jonathan, MD  Subjective   Chief Complaint: None.   HPI: N.p.o. for transesophageal echocardiogram this morning.  Reports no chest pains or trouble breathing.  ROS:  All other ROS reviewed and negative. Pertinent positives noted in the HPI.     Inpatient Medications  Scheduled Meds:  acetaminophen  500 mg Oral TID   amiodarone  400 mg Oral BID   Followed by   [START ON 01/08/2023] amiodarone  200 mg Oral Daily   azelastine  1 spray Each Nare BID   Chlorhexidine Gluconate Cloth  6 each Topical Daily   enoxaparin (LOVENOX) injection  40 mg Subcutaneous Q24H   escitalopram  10 mg Oral Daily   fluticasone  2 spray Each Nare Daily   gabapentin  100 mg Oral BID   insulin aspart  0-15 Units Subcutaneous TID WC   insulin aspart  0-5 Units Subcutaneous QHS   insulin glargine-yfgn  10 Units Subcutaneous QHS   ketotifen  1 drop Left Eye BID   lidocaine  2 patch Transdermal Q24H   loratadine  10 mg Oral Daily   methocarbamol  500 mg Oral TID   pantoprazole  40 mg Oral BID   rosuvastatin  20 mg Oral Daily   sodium chloride flush  10-40 mL Intracatheter Q12H   sodium chloride flush  3 mL Intravenous Q12H   tamsulosin  0.4 mg Oral Daily   Continuous Infusions:  sodium chloride 20 mL/hr at 12/31/22 0450   nafcillin 12 g in sodium chloride 0.9 % 500 mL continuous infusion 20.8 mL/hr at 12/30/22 1901   PRN Meds: acetaminophen **OR** acetaminophen, levalbuterol, metoprolol tartrate, morphine injection, ondansetron **OR** ondansetron (ZOFRAN) IV, oxyCODONE, sodium chloride flush, zolpidem   Vital Signs   Vitals:   12/31/22 0003 12/31/22 0004 12/31/22 0450 12/31/22 0744  BP: (!) 122/49  (!) 129/52 (!) 144/59  Pulse:    77  Resp: 20  (!) 22 (!) 21  Temp:   98.6 F (37 C) 99.2 F (37.3 C)  TempSrc:   Oral Axillary  SpO2:  92% 95% 96%     Intake/Output Summary (Last 24 hours) at 12/31/2022 0856 Last data filed at 12/31/2022 0617 Gross per 24 hour  Intake 1302.8 ml  Output 800 ml  Net 502.8 ml      10/07/2022   10:36 AM 09/30/2022   11:04 AM 09/23/2022   10:38 AM  Last 3 Weights  Weight (lbs) 208 lb 207 lb 207 lb  Weight (kg) 94.348 kg 93.895 kg 93.895 kg      Telemetry  Overnight telemetry shows sinus rhythm 70s, which I personally reviewed.   Physical Exam   Vitals:   12/31/22 0003 12/31/22 0004 12/31/22 0450 12/31/22 0744  BP: (!) 122/49  (!) 129/52 (!) 144/59  Pulse:    77  Resp: 20  (!) 22 (!) 21  Temp:   98.6 F (37 C) 99.2 F (37.3 C)  TempSrc:   Oral Axillary  SpO2:  92% 95% 96%    Intake/Output Summary (Last 24 hours) at 12/31/2022 0856 Last data filed at 12/31/2022 0617 Gross per 24 hour  Intake 1302.8 ml  Output 800 ml  Net 502.8 ml       10/07/2022   10:36 AM 09/30/2022   11:04 AM 09/23/2022   10:38 AM  Last 3 Weights  Weight (lbs) 208 lb 207 lb 207   lb  Weight (kg) 94.348 kg 93.895 kg 93.895 kg    There is no height or weight on file to calculate BMI.  General: Well nourished, well developed, in no acute distress Head: Atraumatic, normal size  Eyes: PEERLA, EOMI  Neck: Supple, no JVD Endocrine: No thryomegaly Cardiac: Normal S1, S2; RRR; no murmurs, rubs, or gallops Lungs: Clear to auscultation bilaterally, no wheezing, rhonchi or rales  Abd: Soft, nontender, no hepatomegaly  Ext: No edema, pulses 2+ Musculoskeletal: No deformities, BUE and BLE strength normal and equal Skin: Warm and dry, no rashes   Neuro: Alert and oriented to person, place, time, and situation, CNII-XII grossly intact, no focal deficits  Psych: Normal mood and affect   Labs  High Sensitivity Troponin:   Recent Labs  Lab 12/24/22 1646 12/24/22 1759  TROPONINIHS 227* 181*     Cardiac EnzymesNo results for input(s): "TROPONINI" in the last 168 hours. No results for input(s): "TROPIPOC" in the last 168 hours.   Chemistry Recent Labs  Lab 12/29/22 1051 12/30/22 0150 12/31/22 0450  NA 139 138 140  K 3.8 3.3* 3.5  CL 103 101 104  CO2 28 31 28  GLUCOSE 319* 219* 236*  BUN 24* 21 17  CREATININE 0.73 0.62 0.65  CALCIUM 7.6* 7.4* 7.3*  PROT 4.8* 5.0* 4.9*  ALBUMIN <1.5* <1.5* <1.5*  AST 24 18 17  ALT 17 14 15  ALKPHOS 97 89 80  BILITOT 1.2 0.9 0.8  GFRNONAA >60 >60 >60  ANIONGAP 8 6 8    Hematology Recent Labs  Lab 12/29/22 1051 12/30/22 0150 12/31/22 0450  WBC 12.5* 13.4* 14.8*  RBC 4.59 4.51 4.16*  HGB 11.7* 11.2* 10.4*  HCT 36.6* 36.4* 33.6*  MCV 79.7* 80.7 80.8  MCH 25.5* 24.8* 25.0*  MCHC 32.0 30.8 31.0  RDW 15.9* 16.2* 16.1*  PLT 112* 120* 158   BNPNo results for input(s): "BNP", "PROBNP" in the last 168 hours.  DDimer No results for input(s): "DDIMER" in the last 168 hours.   Radiology  VAS US UPPER EXTREMITY VENOUS DUPLEX  Result Date: 12/30/2022 UPPER VENOUS STUDY  Patient Name:  Jim Ramirez  Date of Exam:   12/30/2022 Medical Rec #: [id number]         Accession #:    [id number] Date of Birth: 06/25/1951         Patient Gender: M Patient Age:   72 years Exam Location:  [facility] Procedure:      VAS US UPPER EXTREMITY VENOUS DUPLEX Referring Phys: PRANAV PATEL --------------------------------------------------------------------------------  Indications: Pain, and Swelling Anticoagulation: Lovenox. Comparison Study: Previous RUE 12/28/22 superficial in basilic.                   No previous LUE. Performing Technologist: McKayla Maag RVT, VT  Examination Guidelines: A complete evaluation includes B-mode imaging, spectral Doppler, color Doppler, and power Doppler as needed of all accessible portions of each vessel. Bilateral testing is considered an integral part of a complete examination. Limited examinations for reoccurring indications may be performed as noted.  Right Findings: +----------+------------+---------+-----------+----------+-------+ RIGHT      CompressiblePhasicitySpontaneousPropertiesSummary +----------+------------+---------+-----------+----------+-------+ IJV           Full       Yes       Yes                      +----------+------------+---------+-----------+----------+-------+ Subclavian    Full       Yes         Yes                      +----------+------------+---------+-----------+----------+-------+ Axillary      Full       Yes       Yes                      +----------+------------+---------+-----------+----------+-------+ Brachial      Full       Yes       Yes                      +----------+------------+---------+-----------+----------+-------+ Radial        Full                                          +----------+------------+---------+-----------+----------+-------+ Ulnar         Full                                          +----------+------------+---------+-----------+----------+-------+ Cephalic      Full                                          +----------+------------+---------+-----------+----------+-------+ Basilic     Partial      Yes       Yes               Acute  +----------+------------+---------+-----------+----------+-------+ Superficial thrombus in the right basilic vein of the proximal upper arm is noted.  Left Findings: +----------+------------+---------+-----------+----------+-------+ LEFT      CompressiblePhasicitySpontaneousPropertiesSummary +----------+------------+---------+-----------+----------+-------+ IJV           Full       Yes       Yes                      +----------+------------+---------+-----------+----------+-------+ Subclavian    Full       Yes       Yes                      +----------+------------+---------+-----------+----------+-------+ Axillary      Full       Yes       Yes                      +----------+------------+---------+-----------+----------+-------+ Brachial      Full       Yes       Yes                       +----------+------------+---------+-----------+----------+-------+ Radial        Full                                          +----------+------------+---------+-----------+----------+-------+ Ulnar         Full                                          +----------+------------+---------+-----------+----------+-------+   Cephalic    Partial      No        No                Acute  +----------+------------+---------+-----------+----------+-------+ Basilic     Partial      No        No                Acute  +----------+------------+---------+-----------+----------+-------+ Superficial thrombus in the left basilic vein of the distal upper arm to wrist is noted. As well as superficial thrombus in the left cephalic vein of the mid to distal forearm.  Summary:  Right: No evidence of deep vein thrombosis in the upper extremity. Findings consistent with acute superficial vein thrombosis involving the right basilic vein.  Left: No evidence of deep vein thrombosis in the upper extremity. Findings consistent with acute superficial vein thrombosis involving the left basilic vein and left cephalic vein.  *See table(s) above for measurements and observations.  Diagnosing physician: Brandon Cain MD Electronically signed by Brandon Cain MD on 12/30/2022 at 4:41:20 PM.    Final     Cardiac Studies  TTE 12/25/2022  1. Left ventricular ejection fraction, by estimation, is 65 to 70%. The  left ventricle has normal function. Left ventricular endocardial border  not optimally defined to evaluate regional wall motion. Indeterminate  diastolic filling due to E-A fusion.   2. Right ventricular systolic function is hyperdynamic. The right  ventricular size is not well visualized. Tricuspid regurgitation signal is  inadequate for assessing PA pressure.   3. The mitral valve was not well visualized. No evidence of mitral valve  regurgitation.   4. The aortic valve was not well visualized. Aortic  valve regurgitation  is not visualized. No aortic stenosis is present.   5. The inferior vena cava is dilated in size with >50% respiratory  variability, suggesting right atrial pressure of 8 mmHg.   6. Technically difficult study.   Patient Profile  Jim Ramirez is a 72 y.o. male with diabetes, subarachnoid hemorrhage who was admitted on 12/22/2022 with concerns for lumbar spine osteomyelitis and MSSA bacteremia.  Course also complicated by atrial fibrillation with RVR.  Cardiology consulted for further recommendations.   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  Need to exclude endocarditis.  No contraindication to the procedure.  Risk and benefits reviewed.  He is willing to proceed.  Shared Decision Making/Informed Consent The risks [esophageal damage, perforation (1:10,000 risk), bleeding, pharyngeal hematoma as well as other potential complications associated with conscious sedation including aspiration, arrhythmia, respiratory failure and death], benefits (treatment guidance and diagnostic support) and alternatives of a transesophageal echocardiogram were discussed in detail with Mr. Etheredge and he is willing to proceed.   # New onset A-fib -Secondary to sepsis.  Thyroid studies are normal.  Echo shows normal LV function. -Has converted back to sinus rhythm.  Was placed on amiodarone by the initial cardiology rounding service. -Will continue with a brief course of oral amiodarone.  His A-fib is driven by sepsis.  A short course of amiodarone will keep him in rhythm while he recovers. -Platelets were low.  Seem to be recovered.  Would recommend to start anticoagulation when you are able.  He does have evidence of possible ventriculitis and lumbar spine osteomyelitis.  We will await neurology and neurosurgery input on AC safety.            For questions or updates, please  contact [facility] Please consult [email or web address] for contact info under        Signed, Moonachie T. O'Neal, MD, FACC [place]  [facility]  12/31/2022 8:56 AM   

## 2022-12-31 NOTE — Interval H&P Note (Signed)
History and Physical Interval Note:  12/31/2022 9:31 AM  Jim Ramirez  has presented today for surgery, with the diagnosis of bacteria endocarditis.  The various methods of treatment have been discussed with the patient and family. After consideration of risks, benefits and other options for treatment, the patient has consented to  Procedure(s): TRANSESOPHAGEAL ECHOCARDIOGRAM (N/A) as a surgical intervention.  The patient's history has been reviewed, patient examined, no change in status, stable for surgery.  I have reviewed the patient's chart and labs.  Questions were answered to the patient's satisfaction.     Little Ishikawa

## 2022-12-31 NOTE — Transfer of Care (Signed)
Immediate Anesthesia Transfer of Care Note  Patient: BOOKER ZIMMERLI  Procedure(s) Performed: TRANSESOPHAGEAL ECHOCARDIOGRAM  Patient Location: PACU and Cath Lab  Anesthesia Type:MAC  Level of Consciousness: drowsy and patient cooperative  Airway & Oxygen Therapy: Patient connected to nasal cannula oxygen  Post-op Assessment: Report given to RN and Post -op Vital signs reviewed and stable  Post vital signs: Reviewed and stable  Last Vitals:  Vitals Value Taken Time  BP 88/52 12/31/22 1015  Temp    Pulse 63 12/31/22 1016  Resp 24 12/31/22 1016  SpO2 99 % 12/31/22 1016  Vitals shown include unvalidated device data.  Last Pain:  Vitals:   12/31/22 0915  TempSrc: Temporal  PainSc: 8       Patients Stated Pain Goal: 0 (12/31/22 0915)  Complications: There were no known notable events for this encounter.

## 2022-12-31 NOTE — Evaluation (Signed)
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 Rt wrist edema with tenosynovitis, new Afib with RVR. MRI brain negative for acute infarct, but with pyogenic ventriculitis. Bil UE superficial thrombosis. PMhx: ACDF, HTN, T2DM  Clinical Impression  Pt pleasant and wanting to mobilize. Pt reports being able to walk and care for himself PTA. Pt lives with brother and ideally would like to return home but with education understanding that with current inability to stand or even get OOB Patient will benefit from continued inpatient follow up therapy, <3 hours/day. Pt with decreased strength, function, transfers and gait who will benefit from acute therapy to maximize mobility, safety and function to decrease burden of care.  HR 72 SpO2 96% on RA        Recommendations for follow up therapy are one component of a multi-disciplinary discharge planning process, led by the attending physician.  Recommendations may be updated based on patient status, additional functional criteria and insurance authorization.  Follow Up Recommendations Can patient physically be transported by private vehicle: No     Assistance Recommended at Discharge Frequent or constant Supervision/Assistance  Patient can return home with the following  A lot of help with walking and/or transfers;Assistance with cooking/housework;Assistance with feeding;A lot of help with bathing/dressing/bathroom;Help with stairs or ramp for entrance;Assist for transportation;Direct supervision/assist for financial management    Equipment Recommendations Hospital bed;Wheelchair (measurements PT);Wheelchair cushion (measurements PT);BSC/3in1 (hoyer)  Recommendations for Other Services       Functional Status Assessment Patient has had a recent decline in their  functional status and/or demonstrates limited ability to make significant improvements in function in a reasonable and predictable amount of time     Precautions / Restrictions Precautions Precautions: Back;Fall Required Braces or Orthoses: Spinal Brace Spinal Brace: Lumbar corset;Applied in sitting position      Mobility  Bed Mobility Overal bed mobility: Needs Assistance Bed Mobility: Rolling, Sidelying to Sit, Sit to Supine Rolling: Mod assist Sidelying to sit: Mod assist   Sit to supine: Max assist   General bed mobility comments: physical assist to bend knee, reach for rail and use of pad to roll to side. Physical assist to clear legs off surface and elevate trunk. Max assist to return to supine and max +2 to slide toward [facility]    Transfers Overall transfer level: Needs assistance   Transfers: Sit to/from Stand Sit to Stand: From elevated surface, Mod assist           General transfer comment: significantly elevated bed with knees blocked and mod assist to rise. Pt unable to fully clear sacrum or weight shift. x 2 trials    Ambulation/Gait                  Stairs            Wheelchair Mobility    Modified Rankin (Stroke Patients Only)       Balance Overall balance assessment: Needs assistance Sitting-balance support: Feet supported Sitting balance-Leahy Scale: Poor Sitting balance - Comments: guarding EOB                                     Pertinent Vitals/Pain Pain Assessment Faces Pain Scale: Hurts whole lot Pain Location: back Pain Intervention(s): Limited activity within  patient's tolerance, Repositioned    Home Living Family/patient expects to be discharged to:: Private residence Living Arrangements: Other relatives Available Help at Discharge: Family;Available 24 hours/day Type of Home: Apartment Home Access: Level entry       Home Layout: One level Home Equipment: Agricultural consultant (2 wheels);BSC/3in1       Prior Function Prior Level of Function : Independent/Modified Independent;Driving             Mobility Comments: walks 10 miles a day       Hand Dominance        Extremity/Trunk Assessment   Upper Extremity Assessment Upper Extremity Assessment: RUE deficits/detail;LUE deficits/detail RUE Deficits / Details: edema, very limited ROM and weak grip grossly 2/5 LUE Deficits / Details: edema, very limited ROM and weak grip grossly 2/5    Lower Extremity Assessment Lower Extremity Assessment: RLE deficits/detail;LLE deficits/detail RLE Deficits / Details: grossly 2/5 with edema LLE Deficits / Details: noted reddened area on medial malleolus, grossly 2/5, limited knee flexion    Cervical / Trunk Assessment Cervical / Trunk Assessment: Other exceptions Cervical / Trunk Exceptions: back, brace  Communication   Communication: No difficulties  Cognition Arousal/Alertness: Awake/alert Behavior During Therapy: WFL for tasks assessed/performed Overall Cognitive Status: Within Functional Limits for tasks assessed                                          General Comments      Exercises     Assessment/Plan    PT Assessment Patient needs continued PT services  PT Problem List Decreased strength;Decreased range of motion;Decreased activity tolerance;Decreased balance;Decreased mobility;Decreased knowledge of use of DME       PT Treatment Interventions DME instruction;Gait training;Functional mobility training;Therapeutic activities;Patient/family education;Balance training;Therapeutic exercise    PT Goals (Current goals can be found in the Care Plan section)  Acute Rehab PT Goals Patient Stated Goal: return to walking PT Goal Formulation: With patient Time For Goal Achievement: 01/14/23 Potential to Achieve Goals: Fair    Frequency Min 1X/week     Co-evaluation               AM-PAC PT "6 Clicks" Mobility  Outcome Measure Help needed turning  from your back to your side while in a flat bed without using bedrails?: A Lot Help needed moving from lying on your back to sitting on the side of a flat bed without using bedrails?: A Lot Help needed moving to and from a bed to a chair (including a wheelchair)?: Total Help needed standing up from a chair using your arms (e.g., wheelchair or bedside chair)?: Total Help needed to walk in hospital room?: Total Help needed climbing 3-5 steps with a railing? : Total 6 Click Score: 8    End of Session Equipment Utilized During Treatment: Gait belt;Back brace Activity Tolerance: Patient tolerated treatment well Patient left: in bed;with call bell/phone within reach;with nursing/sitter in room Nurse Communication: Mobility status;Need for lift equipment PT Visit Diagnosis: Other abnormalities of gait and mobility (R26.89);Muscle weakness (generalized) (M62.81)    Time: 4098-1191 PT Time Calculation (min) (ACUTE ONLY): 20 min   Charges:   PT Evaluation $PT Eval Moderate Complexity: 1 Mod          Ashmi Blas P, PT Acute Rehabilitation Services Office: [phone]   Enedina Finner Joas Motton 12/31/2022, 1:53 PM

## 2022-12-31 NOTE — Progress Notes (Signed)
PT Cancellation Note  Patient Details Name: Jim Ramirez MRN: [id number] DOB: 23-Jul-1951   Cancelled Treatment:    Reason Eval/Treat Not Completed: Patient at procedure or test/unavailable   Brittini Brubeck B Gram Siedlecki 12/31/2022, 9:11 AM Merryl Hacker, PT Acute Rehabilitation Services Office: [phone]

## 2022-12-31 NOTE — Progress Notes (Signed)
[facility] Progress Note Patient: Jim Ramirez ZOX:[id number] DOB: 01-May-1951 DOA: 12/24/2022  DOS: the patient was seen and examined on 12/31/2022  Brief hospital course: PMH of HTN and DM presenting with back pain.   He initially presented to [facility] on 5/26 with c/o chronic back pain with radiation down both legs.  He also noted SOB x 1 week.  Underwent extensive evaluation including blood cultures, CT chest abdomen pelvis with contrast, CT head, CT lumbar spine and was found to have concern for discitis/osteomyelitis of his lumbar spine.  CT scan negative for PE. Transferred to [facility] for neurosurgery evaluation. Found to have Staph aureus bacteremia with osteonecrosis of the lumbar spine.  Also had A-fib with RVR.  Neurosurgery, ID, cardiology following. Assessment and Plan: Severe sepsis secondary to Staph aureus infection and bacteremia, osteomyelitis and Lumbar spine discitis. Pyogenic ventriculitis causing encephalopathy Present on admission. SIRS criteria on admission with leukocytosis tachycardia and tachypnea. Pt has Platelet count 59, Bilirubin 1.4, MAP 59 and S. Creatinine 1.23.  Troponin elevation. Suggesting SOFA score more than 2.  Blood culture at [facility] growing MSSA. Repeat blood cultures here So far no growth. ID following. Initially was on IV vancomycin.  Later on was on cefazolin.  Now on IV Nafcillin to cover for pyogenic ventriculitis. Echo shows no evidence of vegetation.  Preserved EF.  TEE recommended, scheduled on 6/4. Neurosurgery was also consulted.  Currently thinking that spinal findings less likely infection.  Recommend no indication for surgical intervention currently. Hand surgery was also consulted for right wrist infection, considers findings tenosynovitis and possible infection is likely secondary to edema only.  I agree with ID that the patient will require repeat MRI of wrist, ideally on 6/seven 1 week after prior MRI.  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 Prior history of cervical fusion October 2023. Neurosurgery consulted.  Known to Dr. Lovell Sheehan. Appearance of the MRI per neurosurgery is consistent with vertebral body infarction or necrosis.  Less likely infection. Recommend conservative measures. Informed that there is a chance of vertebral body collapse. If this occurs he would need extensive reconstruction/stabilization.  Neurosurgery ordered lumbosacral corset.  Neurosurgery Recommend gabapentin.  Continue pain control. Monitor.   New onset A-fib. Cardiology consulted. Echocardiogram shows preserved EF 65 to 70% without any wall motion abnormality.  Hyperdynamic RV.  No valvular abnormality. Currently on amiodarone drip. Transition to PO closer to dispo: amiodarone 400 mg BID for 10 days then 200 mg daily.  Platelets have improved.  Neurosurgery has no concerns with regards to anticoagulation.  Also discussed with neurology.  Will initiate IV heparin without bolus and monitor.   Acute thrombocytopenia. Baseline platelet count normal. Marrow suppression due to sepsis and infection.  Now resolved.   Right wrist edema. Concern for tenosynovitis. MRI shows Lobulated fluid collection at the edge of the field of view measuring approximately 3.0 x 1.4 x 2.2 cm.  of fluid and swelling. Concern for tenosynovitis. Consulted with orthopedic surgery recommending no suspicion for infection and recommended range of motion exercises. Treated with IV sticks and monitor renal function.   AKI. Baseline serum creatinine around 0.7. On admission serum creatinine 1.23. improved with IV hydration.  Monitor renal function while receiving IV Lasix. Monitor.   Elevated LFT and as well as hyperbilirubinemia. AST 90s ALT normal.  Bilirubin 1.9 on admission. Now better.    Elevated troponin. Likely demand ischemia.   Dyspnea. CTA chest with contrast on 12/23/2022 at outside facility negative for PE.  No  pneumonia or pleural effusion  or pneumothorax. Chest x-ray on 5/30 negative for pneumonia or vascular congestion.   BPH. Continue Flomax.   Type 2 diabetes mellitus, uncontrolled with hyperglycemia with long-term insulin use. Hemoglobin A1c 8.1.  On sliding scale insulin. Monitor.   HLD. On statin.   Mood disorder. On Lexapro.   OSA. Follows up with pulmonary outpatient. Continue CPAP nightly.   Left eye ptosis Chronic per pt. Has been present for a while pt. He reports it is due to cataract surgery.  CT head on 5/27 negative for stroke.  MRI brain negative for stroke.   Generalized weakness and lethargy  Check MRI brain. Less likely stroke.  PTOT seeing the pt.  Speech was consulted due to his lethargy recommended to stay NPO and FEES after result of the MRI is avaiable.    Goals of care conversation. Discussed in detail with regards to patient's prognosis and current condition. Recommended that the patient would benefit from being DNR with the focus is on quality of life.  Currently DNR/DNI.   Bilateral lower extremity SVT. Continue elevation. Will require anticoagulation for A-fib if can tolerate anticoagulation DVT prophylaxis dose.   Limited IV access. Patient had limited IV access and therefore PICC line is placed. SVTs are not secondary to PICC line.   Subjective: Continues to have shortness of breath.  No nausea no vomiting no fever no chills.  Continues to report back pain.  No constipation.  Somewhat drowsy after TEE.  Occasional myoclonic jerk also seen at the time of examination.  Physical Exam: General: in moderate distress, No Rash Cardiovascular: S1 and S2 Present, No Murmur Respiratory: Increased respiratory effort, Bilateral Air entry present.  Basal crackles, No wheezes Abdomen: Bowel Sound present, No tenderness Extremities: Bilateral upper and lower extremity edema Neuro: Alert and oriented x3, no new focal deficit, no asterixis but occasional myoclonic jerks seen at the time  of examination  Data Reviewed: I have Reviewed nursing notes, Vitals, and Lab results. Since last encounter, pertinent lab results CBC and BMP   . I have ordered test including CBC and BMP  . I have discussed pt's care plan and test results with cardiology  .   Disposition: Status is: Inpatient Remains inpatient appropriate because: Requiring diuresis  SCDs Start: 12/24/22 1740   Family Communication: Family at bedside niece Level of care: Telemetry Cardiac   Vitals:   12/31/22 1109 12/31/22 1600 12/31/22 1659 12/31/22 1914  BP: 119/61  (!) 128/53 (!) 134/52  Pulse: 69   93  Resp:   (!) 22 (!) 22  Temp: 98.7 F (37.1 C)  (!) 100.7 F (38.2 C) 97.7 F (36.5 C)  TempSrc: Oral  Oral Oral  SpO2: 98%  98%   Weight:  93 kg       Author: Lynden Oxford, MD 12/31/2022 7:46 PM  Please look on [email or web address] to find out who is on call.

## 2022-12-31 NOTE — CV Procedure (Signed)
     TRANSESOPHAGEAL ECHOCARDIOGRAM   NAME:  Jim Ramirez   MRN: [id number] DOB:  Apr 01, 1951   ADMIT DATE: 12/24/2022  INDICATIONS: Bacteremia  PROCEDURE:   Informed consent was obtained prior to the procedure. The risks, benefits and alternatives for the procedure were discussed and the patient comprehended these risks.  Risks include, but are not limited to, cough, sore throat, vomiting, nausea, somnolence, esophageal and stomach trauma or perforation, bleeding, low blood pressure, aspiration, pneumonia, infection, trauma to the teeth and death.    After a procedural time-out, the oropharynx was anesthetized and the patient was sedated by the anesthesia service. The transesophageal probe was inserted in the esophagus and stomach without difficulty and multiple views were obtained. Anesthesia was monitored by Sharyn Dross, CRNA.    COMPLICATIONS:    There were no immediate complications.  FINDINGS:  No vegetation seen  Epifanio Lesches MD [facility]  [address], [address] [place], [place] [address] [phone]   10:10 AM

## 2022-12-31 NOTE — Progress Notes (Signed)
ANTICOAGULATION CONSULT NOTE  Pharmacy Consult for heparin Indication: atrial fibrillation  No Known Allergies  Patient Measurements:   Heparin Dosing Weight: 93kg  Vital Signs: Temp: 98.7 F (37.1 C) (06/04 1109) Temp Source: Oral (06/04 1109) BP: 119/61 (06/04 1109) Pulse Rate: 69 (06/04 1109)  Labs: Recent Labs    12/29/22 1051 12/30/22 0150 12/31/22 0450  HGB 11.7* 11.2* 10.4*  HCT 36.6* 36.4* 33.6*  PLT 112* 120* 158  CREATININE 0.73 0.62 0.65    CrCl cannot be calculated (Unknown ideal weight.).   Medical History: 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   CTA negative for aneurysm   Depressed    Diabetes mellitus without complication (HCC)    DVT (deep venous thrombosis) (HCC)    History of kidney stones    Hypertension    Nephrolithiasis    Sleep apnea      Assessment: 21 yoM admitted with back pain found to have MSSA discitis. Pt also with new AFib, to start IV heparin with low goal and no bolus.  Goal of Therapy:  Heparin level 0.3-0.5 units/ml Monitor platelets by anticoagulation protocol: Yes   Plan:  Heparin 1300 units/h no bolus Check heparin level in 8h  Fredonia Highland, PharmD, [place], [facility] Clinical Pharmacist [phone] Please check AMION for all [facility] Pharmacy numbers 12/31/2022

## 2022-12-31 NOTE — Anesthesia Postprocedure Evaluation (Signed)
Anesthesia Post Note  Patient: IRL ABERG  Procedure(s) Performed: TRANSESOPHAGEAL ECHOCARDIOGRAM     Patient location during evaluation: PACU Anesthesia Type: MAC Level of consciousness: awake and alert Pain management: pain level controlled Vital Signs Assessment: post-procedure vital signs reviewed and stable Respiratory status: spontaneous breathing, nonlabored ventilation, respiratory function stable and patient connected to nasal cannula oxygen Cardiovascular status: blood pressure returned to baseline and stable Postop Assessment: no apparent nausea or vomiting Anesthetic complications: no   There were no known notable events for this encounter.  Last Vitals:  Vitals:   12/31/22 1015 12/31/22 1020  BP: (!) 88/52 114/72  Pulse:  66  Resp:  (!) 21  Temp:  36.7 C  SpO2:  98%    Last Pain:  Vitals:   12/31/22 1020  TempSrc: Temporal  PainSc: 0-No pain                 Beryle Lathe

## 2022-12-31 NOTE — Progress Notes (Signed)
OT Cancellation Note  Patient Details Name: Jim Ramirez MRN: [id number] DOB: 1951/03/12   Cancelled Treatment:    Reason Eval/Treat Not Completed: Patient at procedure or test/ unavailable. OT will follow up when appropriate  Galen Manila 12/31/2022, 9:43 AM

## 2023-01-01 ENCOUNTER — Other Ambulatory Visit (HOSPITAL_COMMUNITY): Payer: Self-pay

## 2023-01-01 ENCOUNTER — Encounter (HOSPITAL_COMMUNITY): Payer: Self-pay | Admitting: Cardiology

## 2023-01-01 DIAGNOSIS — I10 Essential (primary) hypertension: Secondary | ICD-10-CM

## 2023-01-01 DIAGNOSIS — B9561 Methicillin susceptible Staphylococcus aureus infection as the cause of diseases classified elsewhere: Secondary | ICD-10-CM | POA: Diagnosis not present

## 2023-01-01 DIAGNOSIS — I4891 Unspecified atrial fibrillation: Secondary | ICD-10-CM | POA: Diagnosis not present

## 2023-01-01 DIAGNOSIS — Z515 Encounter for palliative care: Secondary | ICD-10-CM | POA: Diagnosis not present

## 2023-01-01 DIAGNOSIS — H25812 Combined forms of age-related cataract, left eye: Secondary | ICD-10-CM | POA: Diagnosis not present

## 2023-01-01 DIAGNOSIS — M4646 Discitis, unspecified, lumbar region: Secondary | ICD-10-CM | POA: Diagnosis not present

## 2023-01-01 DIAGNOSIS — R7881 Bacteremia: Secondary | ICD-10-CM | POA: Diagnosis not present

## 2023-01-01 LAB — CBC
HCT: 34.8 % — ABNORMAL LOW (ref 39.0–52.0)
Hemoglobin: 10.7 g/dL — ABNORMAL LOW (ref 13.0–17.0)
MCH: 24.9 pg — ABNORMAL LOW (ref 26.0–34.0)
MCHC: 30.7 g/dL (ref 30.0–36.0)
MCV: 80.9 fL (ref 80.0–100.0)
Platelets: 172 10*3/uL (ref 150–400)
RBC: 4.3 MIL/uL (ref 4.22–5.81)
RDW: 16.5 % — ABNORMAL HIGH (ref 11.5–15.5)
WBC: 13 10*3/uL — ABNORMAL HIGH (ref 4.0–10.5)
nRBC: 0 % (ref 0.0–0.2)

## 2023-01-01 LAB — HEPARIN LEVEL (UNFRACTIONATED)
Heparin Unfractionated: >1.1 IU/mL — ABNORMAL HIGH (ref 0.30–0.70)
Heparin Unfractionated: <0.1 IU/mL — ABNORMAL LOW (ref 0.30–0.70)
Heparin Unfractionated: <0.1 IU/mL — ABNORMAL LOW (ref 0.30–0.70)

## 2023-01-01 LAB — COMPREHENSIVE METABOLIC PANEL
ALT: 12 U/L (ref 0–44)
AST: 23 U/L (ref 15–41)
Albumin: <1.5 g/dL — ABNORMAL LOW (ref 3.5–5.0)
Alkaline Phosphatase: 88 U/L (ref 38–126)
Anion gap: 10 (ref 5–15)
BUN: 17 mg/dL (ref 8–23)
CO2: 28 mmol/L (ref 22–32)
Calcium: 7.4 mg/dL — ABNORMAL LOW (ref 8.9–10.3)
Chloride: 99 mmol/L (ref 98–111)
Creatinine, Ser: 0.71 mg/dL (ref 0.61–1.24)
GFR, Estimated: >60 mL/min (ref >60)
Glucose, Bld: 227 mg/dL — ABNORMAL HIGH (ref 70–99)
Potassium: 3.4 mmol/L — ABNORMAL LOW (ref 3.5–5.1)
Sodium: 137 mmol/L (ref 135–145)
Total Bilirubin: 0.6 mg/dL (ref 0.3–1.2)
Total Protein: 5.2 g/dL — ABNORMAL LOW (ref 6.5–8.1)

## 2023-01-01 LAB — GLUCOSE, CAPILLARY
Glucose-Capillary: 182 mg/dL — ABNORMAL HIGH (ref 70–99)
Glucose-Capillary: 195 mg/dL — ABNORMAL HIGH (ref 70–99)
Glucose-Capillary: 156 mg/dL — ABNORMAL HIGH (ref 70–99)
Glucose-Capillary: 265 mg/dL — ABNORMAL HIGH (ref 70–99)
Glucose-Capillary: 135 mg/dL — ABNORMAL HIGH (ref 70–99)

## 2023-01-01 LAB — MAGNESIUM: Magnesium: 1.9 mg/dL (ref 1.7–2.4)

## 2023-01-01 MED ORDER — MAGNESIUM OXIDE -MG SUPPLEMENT 400 (240 MG) MG PO TABS
400.0000 mg | ORAL_TABLET | Freq: Two times a day (BID) | ORAL | Status: AC
  Administered 2023-01-01: 400 mg via ORAL
  Filled 2023-01-01: qty 1

## 2023-01-01 MED ORDER — INSULIN ASPART 100 UNIT/ML IJ SOLN
4.0000 [IU] | Freq: Three times a day (TID) | INTRAMUSCULAR | Status: DC
  Administered 2023-01-01 (×2): 4 [IU] via SUBCUTANEOUS

## 2023-01-01 MED ORDER — POTASSIUM CHLORIDE CRYS ER 20 MEQ PO TBCR
40.0000 meq | EXTENDED_RELEASE_TABLET | Freq: Two times a day (BID) | ORAL | Status: AC
  Administered 2023-01-01 (×2): 40 meq via ORAL
  Filled 2023-01-01 (×2): qty 2

## 2023-01-01 MED ORDER — INSULIN ASPART 100 UNIT/ML IJ SOLN: 0.0000 [IU] | Freq: Every day | INTRAMUSCULAR | Status: DC

## 2023-01-01 MED ORDER — INSULIN ASPART 100 UNIT/ML IJ SOLN
0.0000 [IU] | Freq: Three times a day (TID) | INTRAMUSCULAR | Status: DC
  Administered 2023-01-01: 3 [IU] via SUBCUTANEOUS
  Administered 2023-01-01: 8 [IU] via SUBCUTANEOUS
  Administered 2023-01-02: 3 [IU] via SUBCUTANEOUS

## 2023-01-01 NOTE — Progress Notes (Signed)
Palliative Medicine Progress Note   Patient Name: Jim Ramirez       Date: 01/01/2023 DOB: 1951-05-05  Age: 72 y.o. MRN#: [id number] Attending Physician: Marinda Elk, MD Primary Care Physician: [name], MD Admit Date: 12/24/2022    HPI/Patient Profile: 72 y.o. male  with past medical history of DM and HTN who initially presented to [facility] on 12/22/2022 with complaint of back pain with radiation down both legs.  He was found to have MSSA bacteremia and osteonecrosis of the lumbar spine.  He was also found to have new onset A-fib with RVR.  He was transferred to [facility] on 12/24/2022 for further management of severe sepsis.    Palliative Medicine was consulted for goals of care conversations    Subjective: Chart reviewed including progress notes, labs, and vital signs. TEE on 6/4 showed a preserved EF and no vegetation.   Bedside visit. Patient is alert and able to have a conversation. His niece/Jessica is visiting. Lunch tray has been delivered - niece states it is not something he will eat. I give her a menu and provide education on how to call and order a meal tray.  Niece expresses concern that patient remains very weak and will need assistance   Discussed PT session earlier today - patient was able to sit edge of bed but was not able to stand. Discussed that PT is recommending short-tern rehab. Patient and niece verbalize understanding and are agreeable to this plan. They acknowledge he remains weak and debilitated.   Niece reports that family is grateful that patient's clinical status has remained stable since our meeting on 6/1.  Offered ongoing palliative support.   Objective:  Physical Exam Vitals reviewed.  Constitutional:      General: He is not in acute  distress.    Appearance: He is ill-appearing.  Pulmonary:     Effort: Pulmonary effort is normal.  Neurological:     Mental Status: He is alert.     Motor: Weakness present.  Psychiatric:        Behavior: Behavior normal.             Vital Signs: BP (!) 134/51 (BP Location: Left Wrist)   Pulse 80   Temp 99.6 F (37.6 C) (Oral)   Resp 20   Ht 6' (1.829 m)  Wt 93 kg   SpO2 99%   BMI 27.81 kg/m  SpO2: SpO2: 99 % O2 Device: O2 Device: Nasal Cannula O2 Flow Rate: O2 Flow Rate (L/min): 3 L/min    Palliative Medicine Assessment & Plan   Assessment: Principal Problem:   Discitis of lumbar region Active Problems:   Diabetes mellitus type 2 in nonobese (HCC)   Dyslipidemia   Essential hypertension   New onset atrial fibrillation (HCC)   Sepsis (HCC)   BPH (benign prostatic hyperplasia)   Mood disorder (HCC)   MSSA bacteremia    Recommendations/Plan: Continue current interventions - patient and family are hopeful for improvement Spiritual care assistance with advanced directives - patient wishes to designate his sister Harriett Sine as health care agent and his niece Shanda Bumps as alternate Ongoing palliative support  Code Status: DNR/DNI   Prognosis:  guarded  Discharge Planning: To Be Determined    Thank you for allowing the Palliative Medicine Team to assist in the care of this patient.   MDM - moderate   Merry Proud, NP   Please contact Palliative Medicine Team phone at [phone] for questions and concerns.  For individual providers, please see AMION.

## 2023-01-01 NOTE — TOC Progression Note (Signed)
Transition of Care [facility]) - Progression Note    Patient Details  Name: Jim Ramirez MRN: [id number] Date of Birth: 02-25-1951  Transition of Care [facility] CM/SW Contact  Carley Hammed, LCSW Phone Number: 01/01/2023, 11:36 AM  Clinical Narrative:      CSW noting recommendations for SNF from PT and consult for LTAC from MD. CSW included CM and [facility] Rep to review pt for LTAC candidacy prior to initiating SNF assessment. TOC will continue to follow for disposition needs.       Expected Discharge Plan and Services                                               Social Determinants of Health (SDOH) Interventions SDOH Screenings   Transportation Needs: No Transportation Needs (12/26/2022)  Utilities: Not At Risk (12/26/2022)  Tobacco Use: Medium Risk (01/01/2023)    Readmission Risk Interventions     No data to display

## 2023-01-01 NOTE — Progress Notes (Signed)
[facility]    Date of Admission:  12/24/2022     ID: Jim Ramirez is a 72 y.o. male with  disseminated MSSA infection with bacteremia, ventriculitis, lumbar discitis/osteonecrosis and possible SSTI of right wrist Principal Problem:   Discitis of lumbar region Active Problems:   Diabetes mellitus type 2 in nonobese (HCC)   Dyslipidemia   Essential hypertension   New onset atrial fibrillation (HCC)   Sepsis (HCC)   BPH (benign prostatic hyperplasia)   Mood disorder (HCC)   MSSA bacteremia    Subjective: Had fevers yesterday up to 100.12F. slept poorly overnight but working with OT this morning with upper arm strength.  Medications:   acetaminophen  500 mg Oral TID   amiodarone  400 mg Oral BID   Followed by   Melene Muller ON 01/08/2023] amiodarone  200 mg Oral Daily   azelastine  1 spray Each Nare BID   Chlorhexidine Gluconate Cloth  6 each Topical Daily   escitalopram  10 mg Oral Daily   fluticasone  2 spray Each Nare Daily   furosemide  40 mg Intravenous BID   gabapentin  100 mg Oral BID   insulin aspart  0-15 Units Subcutaneous TID WC   insulin aspart  0-5 Units Subcutaneous QHS   insulin aspart  4 Units Subcutaneous TID WC   insulin glargine-yfgn  15 Units Subcutaneous QHS   ketotifen  1 drop Left Eye BID   lidocaine  2 patch Transdermal Q24H   loratadine  10 mg Oral Daily   magnesium oxide  400 mg Oral BID   methocarbamol  500 mg Oral TID   pantoprazole  40 mg Oral BID   potassium chloride  40 mEq Oral BID   rosuvastatin  20 mg Oral Daily   sodium chloride flush  10-40 mL Intracatheter Q12H   sodium chloride flush  3 mL Intravenous Q12H   tamsulosin  0.4 mg Oral Daily    Objective: Vital signs in last 24 hours: Temp:  [97.7 F (36.5 C)-100.7 F (38.2 C)] 100.1 F (37.8 C) (06/05 1055) Pulse Rate:  [81-93] 90 (06/05 1055) Resp:  [17-24] 17 (06/05 1055) BP: (119-134)/(52-67) 119/55 (06/05 1055) SpO2:  [93 %-98 %] 96 % (06/05  1055) FiO2 (%):  [28 %] 28 % (06/04 2314) Weight:  [93 kg] 93 kg (06/04 1600)  Physical Exam  Constitutional: He is oriented to person, place, and time. He appears well-developed and well-nourished. No distress.  HENT:  Mouth/Throat: Oropharynx is clear and moist. No oropharyngeal exudate.  Cardiovascular: Normal rate, regular rhythm and normal heart sounds. Exam reveals no gallop and no friction rub.  No murmur heard.  Pulmonary/Chest: Effort normal and breath sounds normal. No respiratory distress. He has no wheezes.  Abdominal: Soft. Bowel sounds are normal. He exhibits no distension. There is no tenderness.  Ext: + left hand swelling > right hand swelling. Neurological: He is alert and oriented to person, place, and time.  Skin: Skin is warm and dry. No rash noted. No erythema.  Psychiatric: He has a normal mood and affect. His behavior is normal.    Lab Results Recent Labs    12/31/22 0450 01/01/23 0252  WBC 14.8* 13.0*  HGB 10.4* 10.7*  HCT 33.6* 34.8*  NA 140 137  K 3.5 3.4*  CL 104 99  CO2 28 28  BUN 17 17  CREATININE 0.65 0.71   Liver Panel Recent Labs    12/31/22 0450 01/01/23 0252  PROT 4.9* 5.2*  ALBUMIN <1.5* <1.5*  AST 17 23  ALT 15 12  ALKPHOS 80 88  BILITOT 0.8 0.6   Sedimentation Rate No results for input(s): "ESRSEDRATE" in the last 72 hours. C-Reactive Protein No results for input(s): "CRP" in the last 72 hours.  Microbiology: 5/28 blood cx NGTD Studies/Results: ECHO TEE  Result Date: 12/31/2022    TRANSESOPHOGEAL ECHO REPORT   Patient Name:   Jim Ramirez Date of Exam: 12/31/2022 Medical Rec #:  [id number]        Height:       72.0 in Accession #:    [id number]       Weight:       208.0 lb Date of Birth:  24-Aug-1950        BSA:          2.166 m Patient Age:    72 years         BP:           114/72 mmHg Patient Gender: M                HR:           78 bpm. Exam Location:  Inpatient Procedure: Transesophageal Echo, Color Doppler and Cardiac  Doppler Indications:    Bacteremia  History:        Patient has prior history of Echocardiogram examinations, most                 recent 12/25/2022. Signs/Symptoms:Bacteremia; Risk                 Factors:Hypertension, Diabetes, Dyslipidemia and Sleep Apnea.  Sonographer:    Milbert Coulter Referring Phys: [id number] Jonita Albee PROCEDURE: After discussion of the risks and benefits of a TEE, an informed consent was obtained from the patient. The transesophogeal probe was passed without difficulty through the esophogus of the patient. Imaged were obtained with the patient in a left lateral decubitus position. Sedation performed by different physician. The patient was monitored while under deep sedation. Anesthestetic sedation was provided intravenously by Anesthesiology: 182.5mg  of Propofol, 100mg  of Lidocaine. Image quality was good. The patient's vital signs; including heart rate, blood pressure, and oxygen saturation; remained stable throughout the procedure. The patient developed no complications during the procedure.  IMPRESSIONS  1. Left ventricular ejection fraction, by estimation, is 60 to 65%. The left ventricle has normal function.  2. Right ventricular systolic function is normal. The right ventricular size is normal.  3. No left atrial/left atrial appendage thrombus was detected.  4. The mitral valve is normal in structure. Trivial mitral valve regurgitation.  5. The aortic valve is tricuspid. Aortic valve regurgitation is not visualized. No aortic stenosis is present. Conclusion(s)/Recommendation(s): No evidence of vegetation/infective endocarditis on this transesophageael echocardiogram. FINDINGS  Left Ventricle: Left ventricular ejection fraction, by estimation, is 60 to 65%. The left ventricle has normal function. The left ventricular internal cavity size was normal in size. Right Ventricle: The right ventricular size is normal. No increase in right ventricular wall thickness. Right ventricular  systolic function is normal. Left Atrium: Left atrial size was normal in size. No left atrial/left atrial appendage thrombus was detected. Right Atrium: Right atrial size was normal in size. Pericardium: There is no evidence of pericardial effusion. Mitral Valve: The mitral valve is normal in structure. Trivial mitral valve regurgitation. Tricuspid Valve: The tricuspid valve is normal in structure. Tricuspid valve regurgitation is trivial. Aortic Valve: The aortic valve is tricuspid.  Aortic valve regurgitation is not visualized. No aortic stenosis is present. Pulmonic Valve: The pulmonic valve was grossly normal. Pulmonic valve regurgitation is trivial. Aorta: The aortic root and ascending aorta are structurally normal, with no evidence of dilitation. IAS/Shunts: No atrial level shunt detected by color flow Doppler. Additional Comments: Spectral Doppler performed. Epifanio Lesches MD Electronically signed by Epifanio Lesches MD Signature Date/Time: 12/31/2022/11:08:06 AM    Final    EP STUDY  Result Date: 12/31/2022 See surgical note for result.    Assessment/Plan: Disseminated MSSA infection = continue with nafcillin. Given that he is having fevers, concern that we do not have source control. Recommend to repeat mri of right wrist and left wrist.  Deconditioning = continue to work with PT/OT.   [facility] Pager: [phone]  01/01/2023, 2:17 PM

## 2023-01-01 NOTE — Progress Notes (Signed)
1910 patient alert x4 on 2l [place] able to make needs known call light in reach heparin running via PICC line  2320 called phlebotomy to do 11pm heparin draw educated them that its running through the PICC line and I can't draw from that line  01/02/2023 Patient seems to have some confusion in the night with place and time

## 2023-01-01 NOTE — Progress Notes (Signed)
[facility] PROGRESS NOTE    Progress Note  Jim Ramirez  ZOX:[id number] DOB: 04/23/1951 DOA: 12/24/2022 PCP: [name], MD     Brief Narrative:   Jim Ramirez is an 72 y.o. male past medical history of hypertension and diabetes initially presented to [facility] on 12/22/2022 for chronic back pain radiating both her legs, with some shortness of breath that started 1 week prior to admission CT scan of the abdomen pelvis or lumbar spine discitis and osteomyelitis, transferred to [facility] found to be bacteremic with staph aureus with necrosis of the lumbar spine, also in A-fib with RVR, neurology, ID and cardiology were consulted   Assessment/Plan:   Severe sepsis secondary to Staph aureus bacteremia in the setting of osteomyelitis and lumbar spine discitis with a pyogenic ventriculitis Encephalopathy: Blood Cultures at [facility] Grew MSSA Start Empirically on IV Vanco. Surveillance Blood Cultures 12/24/2022 have remain negative to date. She has been transition to IV nafcillin to cover pyogenic ventriculitis. ID on board.  2D echo showed no vegetation she has a preserved EF. TEE on 12/31/2022 showed a preserved EF no vegetation. Surgery was consulted recommended no surgical intervention. MRI of the wrist was done that showed about 2 x 2 cm fluid collection.  And a small joint effusion. Hand surgery was consulted for concerns of right wrist infection, they relate there is likely edema.  Recommended range of motion exercises. ID recommended to get an MRI of the wrist in 1 week. ID recommended 6 weeks of IV antibiotics from negative cultures on 12/16/2022. PT recommended skilled nursing facility, wondering if he is a candidate for LTAC. Mentation is improved, he is able to carry on a conversation expresses wishes.  Wrist edema/concern for tenosynovitis: MRI show results above. Orthopedic hand surgery was consulted they had low suspicions for infection recommended range of  motion exercises and continue conservative management.  Osteonecrosis of L4-L5: MRI of the spine more consistent with infarction or necrosis. Neurosurgery was consulted recommended conservative management. Informed that there is a chance of vertebral collapse he will need extensive reconstructive surgery. Neurosurgery order DLCO, gabapentin and continue pain control.  New onset atrial fibrillation: Cardiology was consulted who started her on amiodarone drip.  Now transition to oral loading. Neurosurgery is concerned about anticoagulation they recommended to start IV heparin with no bolus.  Acute kidney injury: With a baseline creatinine around 0.7 on admission 1.3. Creatinine has returned to baseline.  Acute thrombocytopenia: Likely due to infectious etiology now resolved.  Elevated LFTs and hyperbilirubinemia: Likely due to sepsis physiology now resolved.  Elevate troponin: Likely demand ischemia.  Dyspnea: Now resolved imaging unremarkable.  BPH: Continue Flomax.  Diabetes mellitus type 2 with hyperglycemia: With an A1c of 8.1. During her long-acting insulin plus sliding scale will try to keep tighter control. Changed to IV scale to moderate with meal coverage.  Obstructive sleep apnea: Continue CPAP at night.  Hyperlipidemia: Sign continue statins.  Mood disorder: Continue Lexapro.  Moderate protein caloric malnutrition: Noted.  Goals of care: The previous physician discussed in detail prognosis recommended a DNR, family agreed currently DNR/DNI.   DVT prophylaxis: heparin Family Communication:none Status is: Inpatient Remains inpatient appropriate because: Sepsis    Code Status:     Code Status Orders  (From admission, onward)           Start     Ordered   12/28/22 1616  Do not attempt resuscitation (DNR)  Continuous       Question Answer Comment  If patient has no pulse and is not breathing Do Not Attempt Resuscitation   If patient has a  pulse and/or is breathing: Medical Treatment Goals LIMITED ADDITIONAL INTERVENTIONS: Use medication/IV fluids and cardiac monitoring as indicated; Do not use intubation or mechanical ventilation (DNI), also provide comfort medications.  Transfer to Progressive/Stepdown as indicated, avoid Intensive Care.   Consent: Discussion documented in EHR or advanced directives reviewed      12/28/22 1615           Code Status History     Date Active Date Inactive Code Status Order ID Comments User Context   12/24/2022 1745 12/28/2022 1615 Full Code [id number]  Jonah Blue, MD Inpatient   05/22/2022 1944 05/23/2022 1950 Full Code [id number]  Tressie Stalker, MD Inpatient         IV Access:   Peripheral IV   Procedures and diagnostic studies:   ECHO TEE  Result Date: 12/31/2022    TRANSESOPHOGEAL ECHO REPORT   Patient Name:   Jim Ramirez Date of Exam: 12/31/2022 Medical Rec #:  [id number]        Height:       72.0 in Accession #:    [id number]       Weight:       208.0 lb Date of Birth:  11/03/1950        BSA:          2.166 m Patient Age:    72 years         BP:           114/72 mmHg Patient Gender: M                HR:           78 bpm. Exam Location:  Inpatient Procedure: Transesophageal Echo, Color Doppler and Cardiac Doppler Indications:    Bacteremia  History:        Patient has prior history of Echocardiogram examinations, most                 recent 12/25/2022. Signs/Symptoms:Bacteremia; Risk                 Factors:Hypertension, Diabetes, Dyslipidemia and Sleep Apnea.  Sonographer:    Milbert Coulter Referring Phys: [id number] Jonita Albee PROCEDURE: After discussion of the risks and benefits of a TEE, an informed consent was obtained from the patient. The transesophogeal probe was passed without difficulty through the esophogus of the patient. Imaged were obtained with the patient in a left lateral decubitus position. Sedation performed by different physician. The patient was monitored  while under deep sedation. Anesthestetic sedation was provided intravenously by Anesthesiology: 182.5mg  of Propofol, 100mg  of Lidocaine. Image quality was good. The patient's vital signs; including heart rate, blood pressure, and oxygen saturation; remained stable throughout the procedure. The patient developed no complications during the procedure.  IMPRESSIONS  1. Left ventricular ejection fraction, by estimation, is 60 to 65%. The left ventricle has normal function.  2. Right ventricular systolic function is normal. The right ventricular size is normal.  3. No left atrial/left atrial appendage thrombus was detected.  4. The mitral valve is normal in structure. Trivial mitral valve regurgitation.  5. The aortic valve is tricuspid. Aortic valve regurgitation is not visualized. No aortic stenosis is present. Conclusion(s)/Recommendation(s): No evidence of vegetation/infective endocarditis on this transesophageael echocardiogram. FINDINGS  Left Ventricle: Left ventricular ejection fraction, by estimation, is 60 to 65%. The left ventricle has normal function. The  left ventricular internal cavity size was normal in size. Right Ventricle: The right ventricular size is normal. No increase in right ventricular wall thickness. Right ventricular systolic function is normal. Left Atrium: Left atrial size was normal in size. No left atrial/left atrial appendage thrombus was detected. Right Atrium: Right atrial size was normal in size. Pericardium: There is no evidence of pericardial effusion. Mitral Valve: The mitral valve is normal in structure. Trivial mitral valve regurgitation. Tricuspid Valve: The tricuspid valve is normal in structure. Tricuspid valve regurgitation is trivial. Aortic Valve: The aortic valve is tricuspid. Aortic valve regurgitation is not visualized. No aortic stenosis is present. Pulmonic Valve: The pulmonic valve was grossly normal. Pulmonic valve regurgitation is trivial. Aorta: The aortic root and  ascending aorta are structurally normal, with no evidence of dilitation. IAS/Shunts: No atrial level shunt detected by color flow Doppler. Additional Comments: Spectral Doppler performed. Epifanio Lesches MD Electronically signed by Epifanio Lesches MD Signature Date/Time: 12/31/2022/11:08:06 AM    Final    EP STUDY  Result Date: 12/31/2022 See surgical note for result.  VAS [place] UPPER EXTREMITY VENOUS DUPLEX  Result Date: 12/30/2022 UPPER VENOUS STUDY  Patient Name:  JIMARION OTTERBEIN  Date of Exam:   12/30/2022 Medical Rec #: [id number]         Accession #:    [id number] Date of Birth: 1950-12-08         Patient Gender: M Patient Age:   42 years Exam Location:  [facility] Procedure:      VAS [place] UPPER EXTREMITY VENOUS DUPLEX Referring Phys: PRANAV PATEL --------------------------------------------------------------------------------  Indications: Pain, and Swelling Anticoagulation: Lovenox. Comparison Study: Previous RUE 12/28/22 superficial in basilic.                   No previous LUE. Performing Technologist: McKayla Maag RVT, VT  Examination Guidelines: A complete evaluation includes B-mode imaging, spectral Doppler, color Doppler, and power Doppler as needed of all accessible portions of each vessel. Bilateral testing is considered an integral part of a complete examination. Limited examinations for reoccurring indications may be performed as noted.  Right Findings: +----------+------------+---------+-----------+----------+-------+ RIGHT     CompressiblePhasicitySpontaneousPropertiesSummary +----------+------------+---------+-----------+----------+-------+ IJV           Full       Yes       Yes                      +----------+------------+---------+-----------+----------+-------+ Subclavian    Full       Yes       Yes                      +----------+------------+---------+-----------+----------+-------+ Axillary      Full       Yes       Yes                       +----------+------------+---------+-----------+----------+-------+ Brachial      Full       Yes       Yes                      +----------+------------+---------+-----------+----------+-------+ Radial        Full                                          +----------+------------+---------+-----------+----------+-------+  Ulnar         Full                                          +----------+------------+---------+-----------+----------+-------+ Cephalic      Full                                          +----------+------------+---------+-----------+----------+-------+ Basilic     Partial      Yes       Yes               Acute  +----------+------------+---------+-----------+----------+-------+ Superficial thrombus in the right basilic vein of the proximal upper arm is noted.  Left Findings: +----------+------------+---------+-----------+----------+-------+ LEFT      CompressiblePhasicitySpontaneousPropertiesSummary +----------+------------+---------+-----------+----------+-------+ IJV           Full       Yes       Yes                      +----------+------------+---------+-----------+----------+-------+ Subclavian    Full       Yes       Yes                      +----------+------------+---------+-----------+----------+-------+ Axillary      Full       Yes       Yes                      +----------+------------+---------+-----------+----------+-------+ Brachial      Full       Yes       Yes                      +----------+------------+---------+-----------+----------+-------+ Radial        Full                                          +----------+------------+---------+-----------+----------+-------+ Ulnar         Full                                          +----------+------------+---------+-----------+----------+-------+ Cephalic    Partial      No        No                Acute   +----------+------------+---------+-----------+----------+-------+ Basilic     Partial      No        No                Acute  +----------+------------+---------+-----------+----------+-------+ Superficial thrombus in the left basilic vein of the distal upper arm to wrist is noted. As well as superficial thrombus in the left cephalic vein of the mid to distal forearm.  Summary:  Right: No evidence of deep vein thrombosis in the upper extremity. Findings consistent with acute superficial vein thrombosis involving the right basilic vein.  Left: No evidence of deep vein thrombosis in the upper extremity. Findings consistent with acute superficial vein thrombosis involving the left basilic vein and  left cephalic vein.  *See table(s) above for measurements and observations.  Diagnosing physician: Lemar Livings MD Electronically signed by Lemar Livings MD on 12/30/2022 at 4:41:20 PM.    Final      Medical Consultants:   None.   Subjective:    [name] relates he feels better he was continue to work on this  Objective:    Vitals:   12/31/22 2201 12/31/22 2314 12/31/22 2321 01/01/23 0350  BP:   (!) 128/57 (!) 126/58  Pulse:  84 83 81  Resp:   20 (!) 24  Temp:   99.1 F (37.3 C) 98.7 F (37.1 C)  TempSrc:   Oral Oral  SpO2: 98% 96% 98% 93%  Weight:       SpO2: 93 % O2 Flow Rate (L/min): 3 L/min FiO2 (%): 28 %   Intake/Output Summary (Last 24 hours) at 01/01/2023 0646 Last data filed at 01/01/2023 0356 Gross per 24 hour  Intake 300 ml  Output 2900 ml  Net -2600 ml   Filed Weights   12/31/22 1600  Weight: 93 kg    Exam: General exam: In no acute distress. Respiratory system: Good air movement and clear to auscultation. Cardiovascular system: S1 & S2 heard, RRR. No JVD. Gastrointestinal system: Abdomen is nondistended, soft and nontender.  Extremities: No pedal edema. Skin: No rashes, lesions or ulcers Psychiatry: Judgement and insight appear normal. Mood & affect  appropriate.    Data Reviewed:    Labs: Basic Metabolic Panel: Recent Labs  Lab 12/28/22 0028 12/29/22 1051 12/30/22 0150 12/31/22 0450 01/01/23 0252  NA 143 139 138 140 137  K 3.8 3.8 3.3* 3.5 3.4*  CL 106 103 101 104 99  CO2 23 28 31 28 28   GLUCOSE 184* 319* 219* 236* 227*  BUN 29* 24* 21 17 17   CREATININE 0.80 0.73 0.62 0.65 0.71  CALCIUM 8.1* 7.6* 7.4* 7.3* 7.4*  MG 2.0 2.0 2.1 2.0 1.9   GFR Estimated Creatinine Clearance: 94.3 mL/min (by C-G formula based on SCr of 0.71 mg/dL). Liver Function Tests: Recent Labs  Lab 12/27/22 0013 12/29/22 1051 12/30/22 0150 12/31/22 0450 01/01/23 0252  AST 93* 24 18 17 23   ALT 35 17 14 15 12   ALKPHOS 118 97 89 80 88  BILITOT 1.4* 1.2 0.9 0.8 0.6  PROT 4.6* 4.8* 5.0* 4.9* 5.2*  ALBUMIN <1.5* <1.5* <1.5* <1.5* <1.5*   No results for input(s): "LIPASE", "AMYLASE" in the last 168 hours. No results for input(s): "AMMONIA" in the last 168 hours. Coagulation profile No results for input(s): "INR", "PROTIME" in the last 168 hours. COVID-19 Labs  No results for input(s): "DDIMER", "FERRITIN", "LDH", "CRP" in the last 72 hours.  No results found for: "SARSCOV2NAA"  CBC: Recent Labs  Lab 12/25/22 1528 12/26/22 0031 12/28/22 0028 12/29/22 1051 12/30/22 0150 12/31/22 0450 01/01/23 0252  WBC 15.0*   < > 13.2* 12.5* 13.4* 14.8* 13.0*  NEUTROABS 13.3*  --   --  9.7* 10.3* 12.0*  --   HGB 11.8*   < > 12.3* 11.7* 11.2* 10.4* 10.7*  HCT 36.7*   < > 38.3* 36.6* 36.4* 33.6* 34.8*  MCV 77.6*   < > 78.5* 79.7* 80.7 80.8 80.9  PLT PLATELET CLUMPS NOTED ON SMEAR, COUNT APPEARS DECREASED   < > 85* 112* 120* 158 172   < > = values in this interval not displayed.   Cardiac Enzymes: No results for input(s): "CKTOTAL", "CKMB", "CKMBINDEX", "TROPONINI" in the last 168 hours. BNP (  last 3 results) No results for input(s): "PROBNP" in the last 8760 hours. CBG: Recent Labs  Lab 12/31/22 1657 12/31/22 1913 12/31/22 2357 01/01/23 0406  01/01/23 0640  GLUCAP 291* 259* 265* 182* 195*   D-Dimer: No results for input(s): "DDIMER" in the last 72 hours. Hgb A1c: No results for input(s): "HGBA1C" in the last 72 hours. Lipid Profile: No results for input(s): "CHOL", "HDL", "LDLCALC", "TRIG", "CHOLHDL", "LDLDIRECT" in the last 72 hours. Thyroid function studies: No results for input(s): "TSH", "T4TOTAL", "T3FREE", "THYROIDAB" in the last 72 hours.  Invalid input(s): "FREET3" Anemia work up: No results for input(s): "VITAMINB12", "FOLATE", "FERRITIN", "TIBC", "IRON", "RETICCTPCT" in the last 72 hours. Sepsis Labs: Recent Labs  Lab 12/29/22 1051 12/30/22 0150 12/31/22 0450 01/01/23 0252  WBC 12.5* 13.4* 14.8* 13.0*   Microbiology Recent Results (from the past 240 hour(s))  MRSA Next Gen by PCR, Nasal     Status: None   Collection Time: 12/24/22  4:40 PM   Specimen: Nasal Mucosa; Nasal Swab  Result Value Ref Range Status   MRSA by PCR Next Gen NOT DETECTED NOT DETECTED Final    Comment: (NOTE) The GeneXpert MRSA Assay (FDA approved for NASAL specimens only), is one component of a comprehensive MRSA colonization surveillance program. It is not intended to diagnose MRSA infection nor to guide or monitor treatment for MRSA infections. Test performance is not FDA approved in patients less than 57 years old. Performed at [facility], [address] [place], [place] [address]   Culture, blood (Routine X 2) w Reflex to ID Panel     Status: None   Collection Time: 12/24/22  4:46 PM   Specimen: BLOOD LEFT HAND  Result Value Ref Range Status   Specimen Description BLOOD LEFT HAND  Final   Special Requests   Final    BOTTLES DRAWN AEROBIC AND ANAEROBIC Blood Culture adequate volume   Culture   Final    NO GROWTH 5 DAYS Performed at [facility], [address] [place], [place] [address]    Report Status 12/29/2022 FINAL  Final  Culture, blood (Routine X 2) w Reflex to ID Panel     Status: None    Collection Time: 12/24/22  4:48 PM   Specimen: BLOOD RIGHT HAND  Result Value Ref Range Status   Specimen Description BLOOD RIGHT HAND  Final   Special Requests   Final    BOTTLES DRAWN AEROBIC AND ANAEROBIC Blood Culture adequate volume   Culture   Final    NO GROWTH 5 DAYS Performed at [facility], [address], [place], [place] [address]    Report Status 12/29/2022 FINAL  Final     Medications:    acetaminophen  500 mg Oral TID   amiodarone  400 mg Oral BID   Followed by   Melene Muller ON 01/08/2023] amiodarone  200 mg Oral Daily   azelastine  1 spray Each Nare BID   Chlorhexidine Gluconate Cloth  6 each Topical Daily   escitalopram  10 mg Oral Daily   fluticasone  2 spray Each Nare Daily   furosemide  40 mg Intravenous BID   gabapentin  100 mg Oral BID   insulin aspart  0-15 Units Subcutaneous TID WC   insulin aspart  0-5 Units Subcutaneous QHS   insulin glargine-yfgn  15 Units Subcutaneous QHS   ketotifen  1 drop Left Eye BID   lidocaine  2 patch Transdermal Q24H   loratadine  10 mg Oral Daily   methocarbamol  500 mg Oral TID   pantoprazole  40 mg Oral BID   rosuvastatin  20 mg Oral Daily   sodium chloride flush  10-40 mL Intracatheter Q12H   sodium chloride flush  3 mL Intravenous Q12H   tamsulosin  0.4 mg Oral Daily   Continuous Infusions:  [START ON 01/11/2023]  ceFAZolin (ANCEF) IV     heparin 1,550 Units/hr (01/01/23 0410)   lactated ringers     nafcillin 12 g in sodium chloride 0.9 % 500 mL continuous infusion 12 g (12/31/22 1235)      LOS: 8 days   Marinda Elk  [facility]  01/01/2023, 6:46 AM

## 2023-01-01 NOTE — Plan of Care (Signed)
  Problem: Elimination: Goal: Will not experience complications related to urinary retention Outcome: Progressing   Problem: Pain Managment: Goal: General experience of comfort will improve Outcome: Progressing   Problem: Safety: Goal: Ability to remain free from injury will improve Outcome: Progressing   

## 2023-01-01 NOTE — TOC Benefit Eligibility Note (Signed)
Patient Product/process development scientist completed.    The patient is currently admitted and upon discharge could be taking Eliquis 5 mg.  The current 30 day co-pay is $11.20.   The patient is currently admitted and upon discharge could be taking Xarelto 20 mg.  The current 30 day co-pay is $11.20.   The patient is insured through [employer] Part D   This test claim was processed through [facility]- copay amounts may vary at other pharmacies due to pharmacy/plan contracts, or as the patient moves through the different stages of their insurance plan.  Roland Earl, CPHT Pharmacy Patient Advocate Specialist [facility] Patient Advocate Team Direct Number: [phone]  Fax: [phone]

## 2023-01-01 NOTE — Progress Notes (Signed)
Cardiology Progress Note  Patient ID: Jim Ramirez MRN: [id number] DOB: 1951-06-11 Date of Encounter: 01/01/2023  Primary Cardiologist: Dina Rich, MD  Subjective   Chief Complaint: None.   HPI: No evidence of endocarditis.  Maintaining sinus rhythm.  ROS:  All other ROS reviewed and negative. Pertinent positives noted in the HPI.     Inpatient Medications  Scheduled Meds:  acetaminophen  500 mg Oral TID   amiodarone  400 mg Oral BID   Followed by   Melene Muller ON 01/08/2023] amiodarone  200 mg Oral Daily   azelastine  1 spray Each Nare BID   Chlorhexidine Gluconate Cloth  6 each Topical Daily   escitalopram  10 mg Oral Daily   fluticasone  2 spray Each Nare Daily   furosemide  40 mg Intravenous BID   gabapentin  100 mg Oral BID   insulin aspart  0-15 Units Subcutaneous TID WC   insulin aspart  0-5 Units Subcutaneous QHS   insulin aspart  4 Units Subcutaneous TID WC   insulin glargine-yfgn  15 Units Subcutaneous QHS   ketotifen  1 drop Left Eye BID   lidocaine  2 patch Transdermal Q24H   loratadine  10 mg Oral Daily   methocarbamol  500 mg Oral TID   pantoprazole  40 mg Oral BID   rosuvastatin  20 mg Oral Daily   sodium chloride flush  10-40 mL Intracatheter Q12H   sodium chloride flush  3 mL Intravenous Q12H   tamsulosin  0.4 mg Oral Daily   Continuous Infusions:  [START ON 01/11/2023]  ceFAZolin (ANCEF) IV     heparin 1,550 Units/hr (01/01/23 0410)   lactated ringers     nafcillin 12 g in sodium chloride 0.9 % 500 mL continuous infusion 12 g (12/31/22 1235)   PRN Meds: acetaminophen **OR** acetaminophen, levalbuterol, metoprolol tartrate, morphine injection, ondansetron **OR** ondansetron (ZOFRAN) IV, oxyCODONE, sodium chloride flush, zolpidem   Vital Signs   Vitals:   12/31/22 2314 12/31/22 2321 01/01/23 0350 01/01/23 0802  BP:  (!) 128/57 (!) 126/58 125/67  Pulse: 84 83 81 88  Resp:  20 (!) 24 18  Temp:  99.1 F (37.3 C) 98.7 F (37.1 C) 99.2 F (37.3  C)  TempSrc:  Oral Oral Axillary  SpO2: 96% 98% 93% 97%  Weight:        Intake/Output Summary (Last 24 hours) at 01/01/2023 0828 Last data filed at 01/01/2023 0802 Gross per 24 hour  Intake 962.68 ml  Output 2900 ml  Net -1937.32 ml      12/31/2022    4:00 PM 10/07/2022   10:36 AM 09/30/2022   11:04 AM  Last 3 Weights  Weight (lbs) 205 lb 0.4 oz 208 lb 207 lb  Weight (kg) 93 kg 94.348 kg 93.895 kg      Telemetry  Overnight telemetry shows SR 70s, which I personally reviewed.   Physical Exam   Vitals:   12/31/22 2314 12/31/22 2321 01/01/23 0350 01/01/23 0802  BP:  (!) 128/57 (!) 126/58 125/67  Pulse: 84 83 81 88  Resp:  20 (!) 24 18  Temp:  99.1 F (37.3 C) 98.7 F (37.1 C) 99.2 F (37.3 C)  TempSrc:  Oral Oral Axillary  SpO2: 96% 98% 93% 97%  Weight:        Intake/Output Summary (Last 24 hours) at 01/01/2023 0828 Last data filed at 01/01/2023 0802 Gross per 24 hour  Intake 962.68 ml  Output 2900 ml  Net -1937.32 ml  12/31/2022    4:00 PM 10/07/2022   10:36 AM 09/30/2022   11:04 AM  Last 3 Weights  Weight (lbs) 205 lb 0.4 oz 208 lb 207 lb  Weight (kg) 93 kg 94.348 kg 93.895 kg    Body mass index is 27.81 kg/m.  General: Ill-appearing, frail Head: Atraumatic, normal size  Eyes: PEERLA, EOMI  Neck: Supple, no JVD Endocrine: No thryomegaly Cardiac: Normal S1, S2; RRR; no murmurs, rubs, or gallops Lungs: Clear to auscultation bilaterally, no wheezing, rhonchi or rales  Abd: Soft, nontender, no hepatomegaly  Ext: 1+ pitting edema Musculoskeletal: No deformities, BUE and BLE strength normal and equal Skin: Warm and dry, no rashes   Neuro: Alert and oriented to person, place, time, and situation, CNII-XII grossly intact, no focal deficits  Psych: Normal mood and affect   Labs  High Sensitivity Troponin:   Recent Labs  Lab 12/24/22 1646 12/24/22 1759  TROPONINIHS 227* 181*     Cardiac EnzymesNo results for input(s): "TROPONINI" in the last 168 hours. No  results for input(s): "TROPIPOC" in the last 168 hours.  Chemistry Recent Labs  Lab 12/30/22 0150 12/31/22 0450 01/01/23 0252  NA 138 140 137  K 3.3* 3.5 3.4*  CL 101 104 99  CO2 31 28 28   GLUCOSE 219* 236* 227*  BUN 21 17 17   CREATININE 0.62 0.65 0.71  CALCIUM 7.4* 7.3* 7.4*  PROT 5.0* 4.9* 5.2*  ALBUMIN <1.5* <1.5* <1.5*  AST 18 17 23   ALT 14 15 12   ALKPHOS 89 80 88  BILITOT 0.9 0.8 0.6  GFRNONAA >60 >60 >60  ANIONGAP 6 8 10     Hematology Recent Labs  Lab 12/30/22 0150 12/31/22 0450 01/01/23 0252  WBC 13.4* 14.8* 13.0*  RBC 4.51 4.16* 4.30  HGB 11.2* 10.4* 10.7*  HCT 36.4* 33.6* 34.8*  MCV 80.7 80.8 80.9  MCH 24.8* 25.0* 24.9*  MCHC 30.8 31.0 30.7  RDW 16.2* 16.1* 16.5*  PLT 120* 158 172   BNPNo results for input(s): "BNP", "PROBNP" in the last 168 hours.  DDimer No results for input(s): "DDIMER" in the last 168 hours.   Radiology  ECHO TEE  Result Date: 12/31/2022    TRANSESOPHOGEAL ECHO REPORT   Patient Name:   Jim Ramirez Date of Exam: 12/31/2022 Medical Rec #:  [id number]        Height:       72.0 in Accession #:    [id number]       Weight:       208.0 lb Date of Birth:  [birth year]        BSA:          2.166 m Patient Age:    72 years         BP:           114/72 mmHg Patient Gender: M                HR:           78 bpm. Exam Location:  Inpatient Procedure: Transesophageal Echo, Color Doppler and Cardiac Doppler Indications:    Bacteremia  History:        Patient has prior history of Echocardiogram examinations, most                 recent 12/25/2022. Signs/Symptoms:Bacteremia; Risk                 Factors:Hypertension, Diabetes, Dyslipidemia and Sleep Apnea.  Sonographer:    Milbert Coulter  Referring Phys: [id number] Jonita Albee PROCEDURE: After discussion of the risks and benefits of a TEE, an informed consent was obtained from the patient. The transesophogeal probe was passed without difficulty through the esophogus of the patient. Imaged were obtained with  the patient in a left lateral decubitus position. Sedation performed by different physician. The patient was monitored while under deep sedation. Anesthestetic sedation was provided intravenously by Anesthesiology: 182.5mg  of Propofol, 100mg  of Lidocaine. Image quality was good. The patient's vital signs; including heart rate, blood pressure, and oxygen saturation; remained stable throughout the procedure. The patient developed no complications during the procedure.  IMPRESSIONS  1. Left ventricular ejection fraction, by estimation, is 60 to 65%. The left ventricle has normal function.  2. Right ventricular systolic function is normal. The right ventricular size is normal.  3. No left atrial/left atrial appendage thrombus was detected.  4. The mitral valve is normal in structure. Trivial mitral valve regurgitation.  5. The aortic valve is tricuspid. Aortic valve regurgitation is not visualized. No aortic stenosis is present. Conclusion(s)/Recommendation(s): No evidence of vegetation/infective endocarditis on this transesophageael echocardiogram. FINDINGS  Left Ventricle: Left ventricular ejection fraction, by estimation, is 60 to 65%. The left ventricle has normal function. The left ventricular internal cavity size was normal in size. Right Ventricle: The right ventricular size is normal. No increase in right ventricular wall thickness. Right ventricular systolic function is normal. Left Atrium: Left atrial size was normal in size. No left atrial/left atrial appendage thrombus was detected. Right Atrium: Right atrial size was normal in size. Pericardium: There is no evidence of pericardial effusion. Mitral Valve: The mitral valve is normal in structure. Trivial mitral valve regurgitation. Tricuspid Valve: The tricuspid valve is normal in structure. Tricuspid valve regurgitation is trivial. Aortic Valve: The aortic valve is tricuspid. Aortic valve regurgitation is not visualized. No aortic stenosis is present.  Pulmonic Valve: The pulmonic valve was grossly normal. Pulmonic valve regurgitation is trivial. Aorta: The aortic root and ascending aorta are structurally normal, with no evidence of dilitation. IAS/Shunts: No atrial level shunt detected by color flow Doppler. Additional Comments: Spectral Doppler performed. Epifanio Lesches MD Electronically signed by Epifanio Lesches MD Signature Date/Time: 12/31/2022/11:08:06 AM    Final    EP STUDY  Result Date: 12/31/2022 See surgical note for result.  VAS [place] UPPER EXTREMITY VENOUS DUPLEX  Result Date: 12/30/2022 UPPER VENOUS STUDY  Patient Name:  JOAO KINGMAN  Date of Exam:   12/30/2022 Medical Rec #: [id number]         Accession #:    [id number] Date of Birth: 1950-10-05         Patient Gender: M Patient Age:   83 years Exam Location:  [facility] Procedure:      VAS [place] UPPER EXTREMITY VENOUS DUPLEX Referring Phys: PRANAV PATEL --------------------------------------------------------------------------------  Indications: Pain, and Swelling Anticoagulation: Lovenox. Comparison Study: Previous RUE 12/28/22 superficial in basilic.                   No previous LUE. Performing Technologist: McKayla Maag RVT, VT  Examination Guidelines: A complete evaluation includes B-mode imaging, spectral Doppler, color Doppler, and power Doppler as needed of all accessible portions of each vessel. Bilateral testing is considered an integral part of a complete examination. Limited examinations for reoccurring indications may be performed as noted.  Right Findings: +----------+------------+---------+-----------+----------+-------+ RIGHT     CompressiblePhasicitySpontaneousPropertiesSummary +----------+------------+---------+-----------+----------+-------+ IJV           Full  Yes       Yes                      +----------+------------+---------+-----------+----------+-------+ Subclavian    Full       Yes       Yes                       +----------+------------+---------+-----------+----------+-------+ Axillary      Full       Yes       Yes                      +----------+------------+---------+-----------+----------+-------+ Brachial      Full       Yes       Yes                      +----------+------------+---------+-----------+----------+-------+ Radial        Full                                          +----------+------------+---------+-----------+----------+-------+ Ulnar         Full                                          +----------+------------+---------+-----------+----------+-------+ Cephalic      Full                                          +----------+------------+---------+-----------+----------+-------+ Basilic     Partial      Yes       Yes               Acute  +----------+------------+---------+-----------+----------+-------+ Superficial thrombus in the right basilic vein of the proximal upper arm is noted.  Left Findings: +----------+------------+---------+-----------+----------+-------+ LEFT      CompressiblePhasicitySpontaneousPropertiesSummary +----------+------------+---------+-----------+----------+-------+ IJV           Full       Yes       Yes                      +----------+------------+---------+-----------+----------+-------+ Subclavian    Full       Yes       Yes                      +----------+------------+---------+-----------+----------+-------+ Axillary      Full       Yes       Yes                      +----------+------------+---------+-----------+----------+-------+ Brachial      Full       Yes       Yes                      +----------+------------+---------+-----------+----------+-------+ Radial        Full                                          +----------+------------+---------+-----------+----------+-------+ Ulnar  Full                                           +----------+------------+---------+-----------+----------+-------+ Cephalic    Partial      No        No                Acute  +----------+------------+---------+-----------+----------+-------+ Basilic     Partial      No        No                Acute  +----------+------------+---------+-----------+----------+-------+ Superficial thrombus in the left basilic vein of the distal upper arm to wrist is noted. As well as superficial thrombus in the left cephalic vein of the mid to distal forearm.  Summary:  Right: No evidence of deep vein thrombosis in the upper extremity. Findings consistent with acute superficial vein thrombosis involving the right basilic vein.  Left: No evidence of deep vein thrombosis in the upper extremity. Findings consistent with acute superficial vein thrombosis involving the left basilic vein and left cephalic vein.  *See table(s) above for measurements and observations.  Diagnosing physician: Lemar Livings MD Electronically signed by Lemar Livings MD on 12/30/2022 at 4:41:20 PM.    Final     Cardiac Studies  TEE 12/31/2022  1. Left ventricular ejection fraction, by estimation, is 60 to 65%. The  left ventricle has normal function.   2. Right ventricular systolic function is normal. The right ventricular  size is normal.   3. No left atrial/left atrial appendage thrombus was detected.   4. The mitral valve is normal in structure. Trivial mitral valve  regurgitation.   5. The aortic valve is tricuspid. Aortic valve regurgitation is not  visualized. No aortic stenosis is present.    Patient Profile  DAWAN SCHWENKE is a 72 y.o. male with diabetes, subarachnoid hemorrhage who was admitted on 12/22/2022 with concerns for lumbar spine osteomyelitis and MSSA bacteremia. Course also complicated by atrial fibrillation with RVR. Cardiology consulted for further recommendations.   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  He will continue antibiotics per infectious diseases.  # New onset atrial fibrillation -Secondary to sepsis. -Will continue amiodarone as prescribed.  This will be a short-term medication for him. -Due to pyogenic ventriculitis neurology is recommending heparin drip with transition to DOAC if stable.  Would recommend to continue with this plan. -We will arrange outpatient cardiology follow-up.  [facility] will sign off.   Medication Recommendations:  As above Other recommendations (labs, testing, etc):  None Follow up as an outpatient:  4-6 weeks with Dr. Wyline Mood  Signed, Lenna Gilford. Flora Lipps, MD [place]  [facility]  01/01/2023 8:30 AM      For questions or updates, please contact [facility] Please consult [email or web address] for contact info under

## 2023-01-01 NOTE — Progress Notes (Signed)
ANTICOAGULATION CONSULT NOTE Pharmacy Consult for heparin Indication: atrial fibrillation Brief A/P: Heparin level subtherapeutic Increase Heparin rate  No Known Allergies  Patient Measurements: Weight: 93 kg (205 lb 0.4 oz) Heparin Dosing Weight: 93kg  Vital Signs: Temp: 99.1 F (37.3 C) (06/04 2321) Temp Source: Oral (06/04 2321) BP: 128/57 (06/04 2321) Pulse Rate: 83 (06/04 2321)  Labs: Recent Labs    12/29/22 1051 12/30/22 0150 12/31/22 0450 01/01/23 0123 01/01/23 0252  HGB 11.7* 11.2* 10.4*  --  10.7*  HCT 36.6* 36.4* 33.6*  --  34.8*  PLT 112* 120* 158  --  172  HEPARINUNFRC  --   --   --  >1.10* <0.10*  CREATININE 0.73 0.62 0.65  --   --      Estimated Creatinine Clearance: 94.3 mL/min (by C-G formula based on SCr of 0.65 mg/dL).  Assessment: 72 y.o. male with Afib for heparin  Goal of Therapy:  Heparin level 0.3-0.5 units/ml Monitor platelets by anticoagulation protocol: Yes   Plan:  Increase Heparin 1550 units/hr Check heparin level in 8 hours.   Geannie Risen, PharmD, BCPS 01/01/2023

## 2023-01-01 NOTE — Progress Notes (Signed)
ANTICOAGULATION CONSULT NOTE Pharmacy Consult for heparin Indication: atrial fibrillation  No Known Allergies  Patient Measurements: Weight: 93 kg (205 lb 0.4 oz) Heparin Dosing Weight: 93kg  Vital Signs: Temp: 100.1 F (37.8 C) (06/05 1055) Temp Source: Oral (06/05 1500) BP: 119/55 (06/05 1055) Pulse Rate: 89 (06/05 1500)  Labs: Recent Labs    12/30/22 0150 12/31/22 0450 01/01/23 0123 01/01/23 0252 01/01/23 1422  HGB 11.2* 10.4*  --  10.7*  --   HCT 36.4* 33.6*  --  34.8*  --   PLT 120* 158  --  172  --   HEPARINUNFRC  --   --  >1.10* <0.10* <0.10*  CREATININE 0.62 0.65  --  0.71  --      Estimated Creatinine Clearance: 94.3 mL/min (by C-G formula based on SCr of 0.71 mg/dL).  Assessment: 72 y.o. male with new-onset AFib with RVR in the setting of MSSA bacteremia and lumbar spine osteomyelitis and pyogenic ventriculitis. Patient not taking any anticoagulation prior to admission. Pharmacy consulted to dose heparin infusion for AFib. No bolus and lower heparin level goal of 0.3-0.5 per MD.  Heparin level <0.10, remains undetectable Current heparin infusion rate: 1550 units/hr  Hgb 10.7, Plt 172 - stable No s/sx of bleeding or interruptions with heparin infusion per discussion with RN  Goal of Therapy:  Heparin level 0.3-0.5 units/ml Monitor platelets by anticoagulation protocol: Yes   Plan:  Increase heparin infusion rate to 1900 units/hr Check heparin level in 8 hours Monitor daily CBC, heparin level, and for s/sx of bleeding F/u plan to transition to oral [facility] Of note, copay cost of apixaban or rivaroxaban is $11.20/month   Wilburn Cornelia, PharmD, BCPS Clinical Pharmacist 01/01/2023 3:16 PM   Please refer to AMION for pharmacy phone number

## 2023-01-01 NOTE — Progress Notes (Signed)
   01/01/23 1540  Spiritual Encounters  Type of Visit Initial  Care provided to: Pt and family (Pt. sister-Nancy and Pt. brother.)  Conversation partners present during encounter Nurse  Referral source APP (Palliative NP-Julia)  Reason for visit Advance directives  Spiritual Framework  Presenting Themes Meaning/purpose/sources of inspiration (Pt. is an avid Marine scientist. Pt. expressed loss of ability.)  Needs/Challenges/Barriers Pt. requested AD assistance. Clarity needed on choice of HCPOA. Revisit planned on Thursday.  Interventions  Spiritual Care Interventions Made Established relationship of care and support;Compassionate presence;Reflective listening  Intervention Outcomes  Outcomes Connection to spiritual care  Spiritual Care Plan  Spiritual Care Issues Still Outstanding Chaplain will continue to follow  Advance Directives (For Healthcare)  Does Patient Have a Medical Advance Directive? No  Would patient like information on creating a medical advance directive? Yes (Inpatient - patient defers creating a medical advance directive at this time - Information given)   This chaplain attempted [facility] education with Pt. and family while building rapport. The chaplain updated the Pt. RN-Samantha on the visit.  Chaplain Stephanie Acre [phone]

## 2023-01-01 NOTE — Evaluation (Signed)
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 Rt wrist edema with tenosynovitis, new Afib with RVR. MRI brain negative for acute infarct, but with pyogenic ventriculitis. Bil UE superficial thrombosis. PMhx: ACDF, HTN, T2DM   Clinical Impression   Pt presents with decline in function and safety with ADLs and ADL mobility with impaired strength, balance and endurance. PTA pt lived with his brother and reports that he was Ind with ADLs, IADLs, cooks and drives; used no AD for mobility. Pt currently requires max - total A to sit EOB/return to supine, max A with UB ADLs, mod A hand over hand assist with grooming/hygiene tasks, total A with LB ADLs, max A to attempt sit - stand from EOB. Pt with edematous B UEs. Pt would benefit from acute OT services to address impairments to maximize level of function and safety     Recommendations for follow up therapy are one component of a multi-disciplinary discharge planning process, led by the attending physician.  Recommendations may be updated based on patient status, additional functional criteria and insurance authorization.   Assistance Recommended at Discharge Frequent or constant Supervision/Assistance  Patient can return home with the following A lot of help with bathing/dressing/bathroom;A lot of help with walking and/or transfers;Two people to help with walking and/or transfers;Assistance with cooking/housework;Assist for transportation;Direct supervision/assist for medications management;Help with stairs or ramp for entrance;Assistance with feeding    Functional Status Assessment  Patient has had a recent decline in their functional status and demonstrates the ability to make significant improvements in function in a reasonable and predictable amount of time.   Equipment Recommendations   (TBD at nex venue of care)    Recommendations for Other Services       Precautions / Restrictions Precautions Precautions: Back;Fall Precaution Comments: pt unable to recall back precautions, reviewed with pt Required Braces or Orthoses: Spinal Brace Spinal Brace: Lumbar corset;Applied in sitting position Restrictions Weight Bearing Restrictions: No      Mobility Bed Mobility Overal bed mobility: Needs Assistance Bed Mobility: Rolling, Sidelying to Sit, Sit to Supine Rolling: Max assist, Mod assist Sidelying to sit: Max assist   Sit to supine: Max assist   General bed mobility comments: assist with initiating rolling, LE mgt and trunk elevation. total A to return to supine and [place] eof be dcontrols to position to [facility]    Transfers Overall transfer level: Needs assistance     Sit to Stand: From elevated surface, Max assist           General transfer comment: max A to initiate stand, pt unable to clear buttocks      Balance Overall balance assessment: Needs assistance Sitting-balance support: Bilateral upper extremity supported, Feet supported Sitting balance-Leahy Scale: Poor                                     ADL either performed or assessed with clinical judgement   ADL Overall ADL's : Needs assistance/impaired     Grooming: Wash/dry hands;Wash/dry face;Moderate assistance Grooming Details (indicate cue type and reason): hand over hand assist Upper Body Bathing: Maximal assistance   Lower Body Bathing: Total assistance   Upper Body Dressing : Maximal assistance   Lower Body Dressing: Total assistance     Toilet  Transfer Details (indicate cue type and reason): unable to safely transfer to [facility] at this time Toileting- Clothing Manipulation and Hygiene: Total assistance;Bed level         General ADL Comments: pt alert 50% of time     Vision Baseline Vision/History: 1 Wears glasses Patient Visual Report:  No change from baseline       Perception     Praxis      Pertinent Vitals/Pain Pain Assessment Pain Assessment: Faces Faces Pain Scale: Hurts even more Pain Location: back Pain Descriptors / Indicators: Grimacing, Guarding Pain Intervention(s): Limited activity within patient's tolerance, Monitored during session, Repositioned     Hand Dominance Right   Extremity/Trunk Assessment Upper Extremity Assessment Upper Extremity Assessment: Generalized weakness;RUE deficits/detail;LUE deficits/detail RUE Deficits / Details: edema, very limited ROM, weaknes grossly LUE Deficits / Details: edema, very limited ROM, weakness grossly   Lower Extremity Assessment Lower Extremity Assessment: Defer to PT evaluation   Cervical / Trunk Assessment Cervical / Trunk Assessment: Other exceptions Cervical / Trunk Exceptions: back, brace   Communication Communication Communication: No difficulties   Cognition Arousal/Alertness: Awake/alert Behavior During Therapy: WFL for tasks assessed/performed Overall Cognitive Status: Within Functional Limits for tasks assessed                                       General Comments       Exercises     Shoulder Instructions      Home Living Family/patient expects to be discharged to:: Private residence Living Arrangements: Other relatives (brother) Available Help at Discharge: Family;Available 24 hours/day Type of Home: Apartment Home Access: Level entry     Home Layout: One level     Bathroom Shower/Tub: Tub/shower unit;Curtain   Bathroom Toilet: Handicapped height     Home Equipment: Agricultural consultant (2 wheels);BSC/3in1          Prior Functioning/Environment Prior Level of Function : Independent/Modified Independent;Driving             Mobility Comments: walks 10 miles a day ADLs Comments: Ind with ADLs, IADLs, cooks and drives        OT Problem List: Decreased strength;Decreased activity  tolerance;Increased edema;Impaired balance (sitting and/or standing);Decreased coordination;Decreased range of motion;Pain      OT Treatment/Interventions: Self-care/ADL training;Balance training;Therapeutic exercise;DME and/or AE instruction;Therapeutic activities;Patient/family education    OT Goals(Current goals can be found in the care plan section) Acute Rehab OT Goals Patient Stated Goal: "thinking about going to rehab" OT Goal Formulation: With patient Time For Goal Achievement: 01/15/23 Potential to Achieve Goals: Good ADL Goals Pt Will Perform Grooming: with min assist;sitting Pt Will Perform Upper Body Bathing: with mod assist;with min assist;sitting Pt Will Perform Upper Body Dressing: with mod assist;with min assist;sitting Pt Will Transfer to Toilet: with max assist;with mod assist;stand pivot transfer;bedside commode Additional ADL Goal #1: Pt will tolerate AAROM and edema mgt of B UEs to increase functinal use for ADL tasks  OT Frequency: Min 2X/week    Co-evaluation              AM-PAC OT "6 Clicks" Daily Activity     Outcome Measure Help from another person eating meals?: Total Help from another person taking care of personal grooming?: A Lot Help from another person toileting, which includes using toliet, bedpan, or urinal?: Total Help from another person bathing (including washing, rinsing, drying)?: A Lot Help from another person to  put on and taking off regular upper body clothing?: A Lot Help from another person to put on and taking off regular lower body clothing?: Total 6 Click Score: 9   End of Session    Activity Tolerance: Patient limited by fatigue;Patient limited by lethargy;Patient limited by pain Patient left: in bed;with call bell/phone within reach  OT Visit Diagnosis: Other abnormalities of gait and mobility (R26.89);Muscle weakness (generalized) (M62.81);Pain Pain - part of body:  (back)                Time: 1610-9604 OT Time Calculation  (min): 20 min Charges:  OT General Charges $OT Visit: 1 Visit OT Evaluation $OT Eval Moderate Complexity: 1 Mod    Galen Manila 01/01/2023, 1:48 PM

## 2023-01-02 ENCOUNTER — Inpatient Hospital Stay (HOSPITAL_COMMUNITY): Payer: Medicare HMO

## 2023-01-02 DIAGNOSIS — I4891 Unspecified atrial fibrillation: Secondary | ICD-10-CM | POA: Diagnosis not present

## 2023-01-02 DIAGNOSIS — M4646 Discitis, unspecified, lumbar region: Secondary | ICD-10-CM | POA: Diagnosis not present

## 2023-01-02 DIAGNOSIS — R7881 Bacteremia: Secondary | ICD-10-CM | POA: Diagnosis not present

## 2023-01-02 DIAGNOSIS — B9561 Methicillin susceptible Staphylococcus aureus infection as the cause of diseases classified elsewhere: Secondary | ICD-10-CM | POA: Diagnosis not present

## 2023-01-02 DIAGNOSIS — Z515 Encounter for palliative care: Secondary | ICD-10-CM | POA: Diagnosis not present

## 2023-01-02 DIAGNOSIS — M5442 Lumbago with sciatica, left side: Secondary | ICD-10-CM

## 2023-01-02 LAB — CBC
HCT: 31.9 % — ABNORMAL LOW (ref 39.0–52.0)
Hemoglobin: 10 g/dL — ABNORMAL LOW (ref 13.0–17.0)
MCH: 25.9 pg — ABNORMAL LOW (ref 26.0–34.0)
MCHC: 31.3 g/dL (ref 30.0–36.0)
MCV: 82.6 fL (ref 80.0–100.0)
Platelets: 212 10*3/uL (ref 150–400)
RBC: 3.86 MIL/uL — ABNORMAL LOW (ref 4.22–5.81)
RDW: 16.6 % — ABNORMAL HIGH (ref 11.5–15.5)
WBC: 13.7 10*3/uL — ABNORMAL HIGH (ref 4.0–10.5)
nRBC: 0 % (ref 0.0–0.2)

## 2023-01-02 LAB — BASIC METABOLIC PANEL
Anion gap: 9 (ref 5–15)
BUN: 15 mg/dL (ref 8–23)
CO2: 30 mmol/L (ref 22–32)
Calcium: 7.6 mg/dL — ABNORMAL LOW (ref 8.9–10.3)
Chloride: 99 mmol/L (ref 98–111)
Creatinine, Ser: 0.72 mg/dL (ref 0.61–1.24)
GFR, Estimated: >60 mL/min (ref >60)
Glucose, Bld: 134 mg/dL — ABNORMAL HIGH (ref 70–99)
Potassium: 4.2 mmol/L (ref 3.5–5.1)
Sodium: 138 mmol/L (ref 135–145)

## 2023-01-02 LAB — GLUCOSE, CAPILLARY
Glucose-Capillary: 116 mg/dL — ABNORMAL HIGH (ref 70–99)
Glucose-Capillary: 195 mg/dL — ABNORMAL HIGH (ref 70–99)
Glucose-Capillary: 139 mg/dL — ABNORMAL HIGH (ref 70–99)
Glucose-Capillary: 176 mg/dL — ABNORMAL HIGH (ref 70–99)

## 2023-01-02 LAB — HEPARIN LEVEL (UNFRACTIONATED)
Heparin Unfractionated: 0.12 IU/mL — ABNORMAL LOW (ref 0.30–0.70)
Heparin Unfractionated: <0.1 IU/mL — ABNORMAL LOW (ref 0.30–0.70)
Heparin Unfractionated: 0.22 IU/mL — ABNORMAL LOW (ref 0.30–0.70)

## 2023-01-02 MED ORDER — OXYCODONE HCL 5 MG PO TABS
5.0000 mg | ORAL_TABLET | Freq: Once | ORAL | Status: AC
  Administered 2023-01-02: 5 mg via ORAL

## 2023-01-02 MED ORDER — DEXTROSE-SODIUM CHLORIDE 5-0.45 % IV SOLN: INTRAVENOUS | Status: DC

## 2023-01-02 MED ORDER — SENNA 8.6 MG PO TABS: 1.0000 | ORAL_TABLET | Freq: Every day | ORAL | Status: DC | PRN

## 2023-01-02 MED ORDER — LORAZEPAM 1 MG PO TABS
1.0000 mg | ORAL_TABLET | Freq: Once | ORAL | Status: AC
  Administered 2023-01-02: 1 mg via ORAL
  Filled 2023-01-02: qty 1

## 2023-01-02 MED ORDER — LIVING WELL WITH DIABETES BOOK
Freq: Once | Status: AC
  Filled 2023-01-02: qty 1

## 2023-01-02 MED ORDER — HALOPERIDOL LACTATE 5 MG/ML IJ SOLN: 2.0000 mg | Freq: Four times a day (QID) | INTRAMUSCULAR | Status: DC | PRN

## 2023-01-02 MED ORDER — MELATONIN 3 MG PO TABS
3.0000 mg | ORAL_TABLET | Freq: Every day | ORAL | Status: DC
  Administered 2023-01-02 – 2023-01-27 (×25): 3 mg via ORAL
  Filled 2023-01-02 (×25): qty 1

## 2023-01-02 NOTE — Progress Notes (Signed)
Palliative Medicine Progress Note   Patient Name: Jim Ramirez       Date: 01/02/2023 DOB: Dec 10, 1950  Age: 72 y.o. MRN#: [id number] Attending Physician: Marinda Elk, MD Primary Care Physician: [name], MD Admit Date: 12/24/2022    HPI/Patient Profile: 72 y.o. male  with past medical history of DM and HTN who initially presented to [facility] on 12/22/2022 with complaint of back pain with radiation down both legs.  He was found to have MSSA bacteremia and osteonecrosis of the lumbar spine.  He was also found to have new onset A-fib with RVR.  He was transferred to [facility] on 12/24/2022 for further management of severe sepsis.    Palliative Medicine was consulted for goals of care conversations.  Subjective: Chart reviewed, update received from RN, and patient assessed at bedside. Patient worked with PT earlier this morning, and was able to stand (with stedy) and transfer to chair. Note plan for repeat MRI of right hand today.   Patient reports he is doing "fair".  He does report ongoing back pain with radiation down both legs. He rates it 8/10 and describes it as dull and burning. He received oxycodone IR 5 mg at 0832; states this helped relieve the pain initially but has since worn off. He reports a long history of chronic back pain. We discussed the goal to manage his pain so that it is tolerable, but also wanting to minimize side effects (such as sedation).   Patient reports being very motivated to improve his strength and mobility. He hopes to ultimately regain the ability to "do for himself" again. He expresses concern about being a burden to his family. Emotional support provided.    Objective:  Physical Exam Vitals reviewed.  Constitutional:      General: He is not in  acute distress.    Comments: Chronically ill-appearing  Pulmonary:     Effort: Pulmonary effort is normal.  Neurological:     Mental Status: He is alert and oriented to person, place, and time.     Motor: Weakness present.  Psychiatric:        Behavior: Behavior normal.             Vital Signs: BP (!) 139/56 (BP Location: Left Wrist)   Pulse 79   Temp 99.4 F (37.4 C) (  Oral)   Resp (!) 24   Ht 6' (1.829 m)   Wt 93 kg   SpO2 96%   BMI 27.81 kg/m  SpO2: SpO2: 96 % O2 Device: O2 Device: Nasal Cannula O2 Flow Rate: O2 Flow Rate (L/min): 2 L/min   Palliative Medicine Assessment & Plan   Assessment: Principal Problem:   Discitis of lumbar region Active Problems:   Diabetes mellitus type 2 in nonobese (HCC)   Dyslipidemia   Essential hypertension   New onset atrial fibrillation (HCC)   Sepsis (HCC)   BPH (benign prostatic hyperplasia)   Mood disorder (HCC)   MSSA bacteremia    Recommendations/Plan: Oxycodone IR 5 mg x 1 dose Consider changing oxycodone IR to a range of 5-10 mg every 4 hours prn pain Consider increasing gabapentin to 200 mg BID (or 100 mg in the morning and 200 mg at bedtime) Senna 1-2 tablets daily prn constipation PMT will continue to follow    Code Status: DNR/DNI  Prognosis: guarded  Discharge Planning: To Be Determined    Thank you for allowing the Palliative Medicine Team to assist in the care of this patient.   MDM - High   Merry Proud, NP   Please contact Palliative Medicine Team phone at [phone] for questions and concerns.  For individual providers, please see AMION.

## 2023-01-02 NOTE — Progress Notes (Signed)
ANTICOAGULATION CONSULT NOTE Pharmacy Consult for heparin Indication: atrial fibrillation  No Known Allergies  Patient Measurements: Height: 6' (182.9 cm) Weight: 93 kg (205 lb 0.4 oz) IBW/kg (Calculated) : 77.6 Heparin Dosing Weight: 93kg  Vital Signs: Temp: 99.4 F (37.4 C) (06/06 0712) Temp Source: Oral (06/06 0712) BP: 139/56 (06/06 0712)  Labs: Recent Labs    12/31/22 0450 01/01/23 0123 01/01/23 0252 01/01/23 1422 01/02/23 0056 01/02/23 1009  HGB 10.4*  --  10.7*  --  10.0*  --   HCT 33.6*  --  34.8*  --  31.9*  --   PLT 158  --  172  --  212  --   HEPARINUNFRC  --    < > <0.10* <0.10* 0.12* <0.10*  CREATININE 0.65  --  0.71  --  0.72  --    < > = values in this interval not displayed.     Estimated Creatinine Clearance: 94.3 mL/min (by C-G formula based on SCr of 0.72 mg/dL).  Assessment: 72 y.o. male with new-onset AFib with RVR in the setting of MSSA bacteremia and lumbar spine osteomyelitis and pyogenic ventriculitis. Patient not taking any anticoagulation prior to admission. Pharmacy consulted to dose heparin infusion for AFib. No bolus and lower heparin level goal of 0.3-0.5 per MD.  Heparin level <0.10, remains undetectable Current heparin infusion rate: 2100 units/hr  Hgb 10.0, Plt 212 - stable No s/sx of bleeding or interruptions with heparin infusion per discussion with RN  Goal of Therapy:  Heparin level 0.3-0.5 units/ml Monitor platelets by anticoagulation protocol: Yes   Plan:  Increase heparin infusion rate to 2350 units/hr Check heparin level in 8 hours Monitor daily CBC, heparin level, and for s/sx of bleeding F/u plan to transition to oral [facility] Of note, copay cost of apixaban or rivaroxaban is $11.20/month   Wilburn Cornelia, PharmD, BCPS Clinical Pharmacist 01/02/2023 12:44 PM   Please refer to [facility] for pharmacy phone number

## 2023-01-02 NOTE — Progress Notes (Signed)
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 Rt wrist edema with tenosynovitis, new Afib with RVR. MRI brain negative for acute infarct, but with pyogenic ventriculitis. Bil UE superficial thrombosis. PMhx: ACDF, HTN, T2DM    PT Comments    Pt pleasant and incontinent of stool on arrival with total assist for pericare and linen change. Pt with improved UB strength and mobility, able to roll and stand with stedy today as well as transfer to chair. Pt remains eager to gain strength and function for return home. Pt educated for precautions and brace wear with mobility via stedy recommended with staff at this time. Will continue to follow.   89% Ra end of session returned to 2L at 93%   Recommendations for follow up therapy are one component of a multi-disciplinary discharge planning process, led by the attending physician.  Recommendations may be updated based on patient status, additional functional criteria and insurance authorization.  Follow Up Recommendations  Can patient physically be transported by private vehicle: No    Assistance Recommended at Discharge Frequent or constant Supervision/Assistance  Patient can return home with the following A lot of help with walking and/or transfers;Assistance with cooking/housework;Assistance with feeding;A lot of help with bathing/dressing/bathroom;Help with stairs or ramp for entrance;Assist for transportation;Direct supervision/assist for financial management   Equipment Recommendations  Hospital bed;Wheelchair (measurements PT);Wheelchair cushion (measurements PT);BSC/3in1    Recommendations for Other Services       Precautions / Restrictions Precautions Precautions: Back;Fall Precaution Comments: pt educated for back precautiong and brace wear Required  Braces or Orthoses: Spinal Brace Spinal Brace: Lumbar corset;Applied in sitting position Restrictions Weight Bearing Restrictions: No     Mobility  Bed Mobility Overal bed mobility: Needs Assistance Bed Mobility: Rolling, Sidelying to Sit Rolling: Mod assist Sidelying to sit: Mod assist       General bed mobility comments: mod assist to roll bil x 2, mod assist to clear legs and elevate trunk to EOB with max cues    Transfers Overall transfer level: Needs assistance   Transfers: Bed to chair/wheelchair/BSC, Sit to/from Stand Sit to Stand: From elevated surface, Mod assist, +2 physical assistance           General transfer comment: mod +2 to stand from EOB with bil UE support on therapist/tech. Pt then able to stand with stedy with mod assist. 4 repeated standing trials from stedy pads with min assist. Stand from recliner to stedy mod +2. Max standing time grossly 1 min Transfer via Lift Equipment: Stedy  Ambulation/Gait                   Stairs             Wheelchair Mobility    Modified Rankin (Stroke Patients Only)       Balance Overall balance assessment: Needs assistance Sitting-balance support: Bilateral upper extremity supported, Feet supported Sitting balance-Leahy Scale: Fair Sitting balance - Comments: guarding EOB                                    Cognition Arousal/Alertness: Awake/alert Behavior During Therapy: WFL for tasks assessed/performed Overall Cognitive Status: Impaired/Different from baseline Area of Impairment: Orientation, Memory  Orientation Level: Time   Memory: Decreased short-term memory, Decreased recall of precautions                  Exercises      General Comments        Pertinent Vitals/Pain Pain Assessment Pain Score: 5  Pain Location: back with movement, none at rest Pain Descriptors / Indicators: Aching, Guarding Pain Intervention(s): Limited activity  within patient's tolerance, Monitored during session, RN gave pain meds during session    Home Living                          Prior Function            PT Goals (current goals can now be found in the care plan section) Progress towards PT goals: Progressing toward goals    Frequency    Min 1X/week      PT Plan Current plan remains appropriate    Co-evaluation              AM-PAC PT "6 Clicks" Mobility   Outcome Measure  Help needed turning from your back to your side while in a flat bed without using bedrails?: A Lot Help needed moving from lying on your back to sitting on the side of a flat bed without using bedrails?: A Lot Help needed moving to and from a bed to a chair (including a wheelchair)?: Total Help needed standing up from a chair using your arms (e.g., wheelchair or bedside chair)?: Total Help needed to walk in hospital room?: Total Help needed climbing 3-5 steps with a railing? : Total 6 Click Score: 8    End of Session Equipment Utilized During Treatment: Gait belt;Back brace Activity Tolerance: Patient tolerated treatment well Patient left: in chair;with call bell/phone within reach;with chair alarm set Nurse Communication: Mobility status;Need for lift equipment PT Visit Diagnosis: Other abnormalities of gait and mobility (R26.89);Muscle weakness (generalized) (M62.81)     Time: 1610-9604 PT Time Calculation (min) (ACUTE ONLY): 32 min  Charges:  $Therapeutic Activity: 23-37 mins                     Merryl Hacker, PT Acute Rehabilitation Services Office: [phone]    Enedina Finner Fusaye Wachtel 01/02/2023, 9:51 AM

## 2023-01-02 NOTE — Progress Notes (Signed)
ANTICOAGULATION CONSULT NOTE - Follow Up Consult  Pharmacy Consult for heparin Indication: atrial fibrillation  Labs: Recent Labs    12/31/22 0450 01/01/23 0123 01/01/23 0252 01/01/23 1422 01/02/23 0056  HGB 10.4*  --  10.7*  --  10.0*  HCT 33.6*  --  34.8*  --  31.9*  PLT 158  --  172  --  212  HEPARINUNFRC  --    < > <0.10* <0.10* 0.12*  CREATININE 0.65  --  0.71  --  0.72   < > = values in this interval not displayed.    Assessment: 72yo male subtherapeutic on heparin after rate change; no infusion issues or signs of bleeding per RN.  Goal of Therapy:  Heparin level 0.3-0.5 units/ml   Plan:  Increase heparin infusion by 2 units/kg/hr to 2100 units/hr. Check level in 6 hours.   Vernard Gambles, PharmD, BCPS 01/02/2023 2:04 AM

## 2023-01-02 NOTE — Plan of Care (Signed)
  Problem: Education: Goal: Knowledge of General Education information will improve Description: Including pain rating scale, medication(s)/side effects and non-pharmacologic comfort measures Outcome: Not Progressing   Problem: Health Behavior/Discharge Planning: Goal: Ability to manage health-related needs will improve Outcome: Not Progressing   Problem: Clinical Measurements: Goal: Ability to maintain clinical measurements within normal limits will improve Outcome: Not Progressing Goal: Will remain free from infection Outcome: Not Progressing Goal: Diagnostic test results will improve Outcome: Not Progressing Goal: Respiratory complications will improve Outcome: Not Progressing Goal: Cardiovascular complication will be avoided Outcome: Not Progressing   Problem: Activity: Goal: Risk for activity intolerance will decrease Outcome: Not Progressing   Problem: Nutrition: Goal: Adequate nutrition will be maintained Outcome: Not Progressing   Problem: Coping: Goal: Level of anxiety will decrease Outcome: Not Progressing   Problem: Elimination: Goal: Will not experience complications related to bowel motility Outcome: Not Progressing Goal: Will not experience complications related to urinary retention Outcome: Not Progressing   Problem: Pain Managment: Goal: General experience of comfort will improve Outcome: Not Progressing   Problem: Safety: Goal: Ability to remain free from injury will improve Outcome: Not Progressing   Problem: Skin Integrity: Goal: Risk for impaired skin integrity will decrease Outcome: Not Progressing   Problem: Education: Goal: Knowledge of disease or condition will improve Outcome: Not Progressing Goal: Understanding of medication regimen will improve Outcome: Not Progressing Goal: Individualized Educational Video(s) Outcome: Not Progressing   Problem: Activity: Goal: Ability to tolerate increased activity will improve Outcome: Not  Progressing   Problem: Cardiac: Goal: Ability to achieve and maintain adequate cardiopulmonary perfusion will improve Outcome: Not Progressing   Problem: Health Behavior/Discharge Planning: Goal: Ability to safely manage health-related needs after discharge will improve Outcome: Not Progressing   Problem: Education: Goal: Ability to describe self-care measures that may prevent or decrease complications (Diabetes Survival Skills Education) will improve Outcome: Not Progressing Goal: Individualized Educational Video(s) Outcome: Not Progressing   Problem: Coping: Goal: Ability to adjust to condition or change in health will improve Outcome: Not Progressing   Problem: Fluid Volume: Goal: Ability to maintain a balanced intake and output will improve Outcome: Not Progressing   Problem: Health Behavior/Discharge Planning: Goal: Ability to identify and utilize available resources and services will improve Outcome: Not Progressing Goal: Ability to manage health-related needs will improve Outcome: Not Progressing   Problem: Metabolic: Goal: Ability to maintain appropriate glucose levels will improve Outcome: Not Progressing   Problem: Nutritional: Goal: Maintenance of adequate nutrition will improve Outcome: Not Progressing Goal: Progress toward achieving an optimal weight will improve Outcome: Not Progressing   Problem: Skin Integrity: Goal: Risk for impaired skin integrity will decrease Outcome: Not Progressing   Problem: Tissue Perfusion: Goal: Adequacy of tissue perfusion will improve Outcome: Not Progressing

## 2023-01-02 NOTE — Progress Notes (Signed)
ANTICOAGULATION CONSULT NOTE Pharmacy Consult for heparin Indication: atrial fibrillation  No Known Allergies  Patient Measurements: Height: 6' (182.9 cm) Weight: 93 kg (205 lb 0.4 oz) IBW/kg (Calculated) : 77.6 Heparin Dosing Weight: 93kg  Vital Signs: Temp: 101.8 F (38.8 C) (06/06 1916) Temp Source: Oral (06/06 1916) BP: 120/55 (06/06 1532) Pulse Rate: 95 (06/06 1916)  Labs: Recent Labs    12/31/22 0450 01/01/23 0123 01/01/23 0252 01/01/23 1422 01/02/23 0056 01/02/23 1009 01/02/23 2056  HGB 10.4*  --  10.7*  --  10.0*  --   --   HCT 33.6*  --  34.8*  --  31.9*  --   --   PLT 158  --  172  --  212  --   --   HEPARINUNFRC  --    < > <0.10*   < > 0.12* <0.10* 0.22*  CREATININE 0.65  --  0.71  --  0.72  --   --    < > = values in this interval not displayed.    Estimated Creatinine Clearance: 94.3 mL/min (by C-G formula based on SCr of 0.72 mg/dL).  Assessment: 72 y.o. male with new-onset AFib with RVR in the setting of MSSA bacteremia and lumbar spine osteomyelitis and pyogenic ventriculitis. Patient not taking any anticoagulation prior to admission. Pharmacy consulted to dose heparin infusion for AFib. No bolus and lower heparin level goal of 0.3-0.5 per MD.  Heparin level <0.10, remains undetectable Current heparin infusion rate: 2100 units/hr  Hgb 10.0, Plt 212 - stable No s/sx of bleeding or interruptions with heparin infusion per discussion with RN  PM Update: Heparin level 0.22 after increase to 2350 units/hr.   Goal of Therapy:  Heparin level 0.3-0.5 units/ml Monitor platelets by anticoagulation protocol: Yes   Plan:  Increase heparin infusion rate to 2450 units/hr Check heparin level in 8 hours - ok with AM labs Monitor daily CBC, heparin level, and for s/sx of bleeding F/u plan to transition to oral [facility] Of note, copay cost of apixaban or rivaroxaban is $11.20/month   Link Snuffer, PharmD, BCPS, BCCCP Clinical Pharmacist Please refer to [facility]  for [facility] Pharmacy numbers 01/02/2023 9:22 PM   Please refer to [facility] for pharmacy phone number

## 2023-01-02 NOTE — TOC CM/SW Note (Signed)
Referral for LTAC . Discussed with attending, he advised NCM to wait to discuss LTAC with patient/family tomorrow .

## 2023-01-02 NOTE — Progress Notes (Addendum)
[facility] PROGRESS NOTE    Progress Note  Jim Ramirez  RKY:[id number] DOB: October 06, 1950 DOA: 12/24/2022 PCP: [name], MD     Brief Narrative:   Jim Ramirez is an 72 y.o. male past medical history of hypertension and diabetes initially presented to [facility] on 12/22/2022 for chronic back pain radiating both her legs, with some shortness of breath that started 1 week prior to admission CT scan of the abdomen pelvis or lumbar spine discitis and osteomyelitis, transferred to [facility] found to be bacteremic with staph aureus with necrosis of the lumbar spine, also in A-fib with RVR, neurology, ID and cardiology were consulted.  MRI of the wrist was done that showed fluid collection and surgery was consulted who recommended conservative management.  Despite IV antibiotics he continues to spike fevers.  After repeat an MRI of the wrist.  Assessment/Plan:   Severe sepsis secondary to Staph aureus bacteremia in the setting of osteomyelitis and lumbar spine discitis with a pyogenic ventriculitis Encephalopathy: Blood Cultures at [facility] Grew MSSA Start Empirically on IV Vanco. Surveillance Blood Cultures 12/24/2022 have remain negative to date. She has been transition to IV nafcillin to cover pyogenic ventriculitis. ID on board.  TEE on 12/31/2022 showed a preserved EF no vegetation. Surgery was consulted recommended no surgical intervention. MRI of the wrist was done that showed about 2 x 2 cm fluid collection.  And a small joint effusion. Hand surgery was consulted for concerns of right wrist infection, they relate there is likely edema.  Recommended range of motion exercises. ID recommended 6 weeks of IV antibiotics from negative cultures on 12/16/2022. PT recommended skilled nursing facility, wondering if he is a candidate for LTAC. Mentation is improved, he is able to carry on a conversation expresses wishes. As he continues to spike fevers on antibiotics, I agree with ID  will need to repeat MRI of the wrist  Left wrist edema/concern for tenosynovitis: MRI show results above. Orthopedic hand surgery was consulted they had low suspicions for infection recommended range of motion exercises and continue conservative management. We will have to repeat the MRI of the wrist as we do not have source control, he continues to spike fevers.  Osteonecrosis of L4-L5: MRI of the spine more consistent with infarction or necrosis. Neurosurgery was consulted recommended conservative management. Informed that there is a chance of vertebral collapse he will need extensive reconstructive surgery. Neurosurgery order DLCO, gabapentin and continue pain control.  New onset atrial fibrillation: Cardiology was consulted who started her on amiodarone drip.  Now transition to oral loading. Neurosurgery is concerned about anticoagulation they recommended to start IV heparin with no bolus. Will have to continue on IV heparin for now in case he needs I&D or debridement.  Acute kidney injury: With a baseline creatinine around 0.7 on admission 1.3. Creatinine has returned to baseline.  Acute thrombocytopenia: Likely due to infectious etiology now resolved.  Elevated LFTs and hyperbilirubinemia: Likely due to sepsis physiology now resolved.  Elevate troponin: Likely demand ischemia.  Dyspnea: Now resolved imaging unremarkable.  BPH: Continue Flomax.  Diabetes mellitus type 2 with hyperglycemia: With an A1c of 8.1. During her long-acting insulin plus sliding scale will try to keep tighter control. Changed to IV scale to moderate with meal coverage.  Obstructive sleep apnea: Continue CPAP at night.  Hyperlipidemia: Sign continue statins.  Mood disorder: Continue Lexapro.  Moderate protein caloric malnutrition: Noted.  Goals of care: The previous physician discussed in detail prognosis recommended a DNR,  family agreed currently DNR/DNI.   DVT prophylaxis:  heparin Family Communication:none Status is: Inpatient Remains inpatient appropriate because: Sepsis    Code Status:     Code Status Orders  (From admission, onward)           Start     Ordered   12/28/22 1616  Do not attempt resuscitation (DNR)  Continuous       Question Answer Comment  If patient has no pulse and is not breathing Do Not Attempt Resuscitation   If patient has a pulse and/or is breathing: Medical Treatment Goals LIMITED ADDITIONAL INTERVENTIONS: Use medication/IV fluids and cardiac monitoring as indicated; Do not use intubation or mechanical ventilation (DNI), also provide comfort medications.  Transfer to Progressive/Stepdown as indicated, avoid Intensive Care.   Consent: Discussion documented in EHR or advanced directives reviewed      12/28/22 1615           Code Status History     Date Active Date Inactive Code Status Order ID Comments User Context   12/24/2022 1745 12/28/2022 1615 Full Code [id number]  Jonah Blue, MD Inpatient   05/22/2022 1944 05/23/2022 1950 Full Code [id number]  Tressie Stalker, MD Inpatient         IV Access:   Peripheral IV   Procedures and diagnostic studies:   ECHO TEE  Result Date: 12/31/2022    TRANSESOPHOGEAL ECHO REPORT   Patient Name:   Jim Ramirez Date of Exam: 12/31/2022 Medical Rec #:  [id number]        Height:       72.0 in Accession #:    [id number]       Weight:       208.0 lb Date of Birth:  01/14/1951        BSA:          2.166 m Patient Age:    72 years         BP:           114/72 mmHg Patient Gender: M                HR:           78 bpm. Exam Location:  Inpatient Procedure: Transesophageal Echo, Color Doppler and Cardiac Doppler Indications:    Bacteremia  History:        Patient has prior history of Echocardiogram examinations, most                 recent 12/25/2022. Signs/Symptoms:Bacteremia; Risk                 Factors:Hypertension, Diabetes, Dyslipidemia and Sleep Apnea.  Sonographer:    Milbert Coulter Referring Phys: [id number] Jonita Albee PROCEDURE: After discussion of the risks and benefits of a TEE, an informed consent was obtained from the patient. The transesophogeal probe was passed without difficulty through the esophogus of the patient. Imaged were obtained with the patient in a left lateral decubitus position. Sedation performed by different physician. The patient was monitored while under deep sedation. Anesthestetic sedation was provided intravenously by Anesthesiology: 182.5mg  of Propofol, 100mg  of Lidocaine. Image quality was good. The patient's vital signs; including heart rate, blood pressure, and oxygen saturation; remained stable throughout the procedure. The patient developed no complications during the procedure.  IMPRESSIONS  1. Left ventricular ejection fraction, by estimation, is 60 to 65%. The left ventricle has normal function.  2. Right ventricular systolic function is normal. The right ventricular size is  normal.  3. No left atrial/left atrial appendage thrombus was detected.  4. The mitral valve is normal in structure. Trivial mitral valve regurgitation.  5. The aortic valve is tricuspid. Aortic valve regurgitation is not visualized. No aortic stenosis is present. Conclusion(s)/Recommendation(s): No evidence of vegetation/infective endocarditis on this transesophageael echocardiogram. FINDINGS  Left Ventricle: Left ventricular ejection fraction, by estimation, is 60 to 65%. The left ventricle has normal function. The left ventricular internal cavity size was normal in size. Right Ventricle: The right ventricular size is normal. No increase in right ventricular wall thickness. Right ventricular systolic function is normal. Left Atrium: Left atrial size was normal in size. No left atrial/left atrial appendage thrombus was detected. Right Atrium: Right atrial size was normal in size. Pericardium: There is no evidence of pericardial effusion. Mitral Valve: The mitral valve is  normal in structure. Trivial mitral valve regurgitation. Tricuspid Valve: The tricuspid valve is normal in structure. Tricuspid valve regurgitation is trivial. Aortic Valve: The aortic valve is tricuspid. Aortic valve regurgitation is not visualized. No aortic stenosis is present. Pulmonic Valve: The pulmonic valve was grossly normal. Pulmonic valve regurgitation is trivial. Aorta: The aortic root and ascending aorta are structurally normal, with no evidence of dilitation. IAS/Shunts: No atrial level shunt detected by color flow Doppler. Additional Comments: Spectral Doppler performed. Epifanio Lesches MD Electronically signed by Epifanio Lesches MD Signature Date/Time: 12/31/2022/11:08:06 AM    Final    EP STUDY  Result Date: 12/31/2022 See surgical note for result.    Medical Consultants:   None.   Subjective:    Jim Ramirez still having wrist pain.  Objective:    Vitals:   01/01/23 1500 01/01/23 1922 01/01/23 2335 01/02/23 0333  BP: (!) 133/97 (!) 134/51 (!) 120/50 (!) 144/76  Pulse: 89 80 79   Resp: 17 20 16 18   Temp: 97.6 F (36.4 C) 99.6 F (37.6 C) 99.4 F (37.4 C) 99.1 F (37.3 C)  TempSrc: Oral Oral Oral Oral  SpO2: 97% 99% 96% 96%  Weight: 93 kg     Height: 6' (1.829 m)      SpO2: 96 % O2 Flow Rate (L/min): 2 L/min FiO2 (%): 28 %   Intake/Output Summary (Last 24 hours) at 01/02/2023 0707 Last data filed at 01/02/2023 0600 Gross per 24 hour  Intake 2351.85 ml  Output 2300 ml  Net 51.85 ml    Filed Weights   12/31/22 1600 01/01/23 1500  Weight: 93 kg 93 kg    Exam: General exam: In no acute distress. Respiratory system: Good air movement and clear to auscultation. Cardiovascular system: S1 & S2 heard, RRR. No JVD. Gastrointestinal system: Abdomen is nondistended, soft and nontender.  Extremities: His right wrist is swollen tender to touch and warm and erythematous. Skin: No rashes, lesions or ulcers Psychiatry: Judgement and insight appear  normal. Mood & affect appropriate. Data Reviewed:    Labs: Basic Metabolic Panel: Recent Labs  Lab 12/28/22 0028 12/29/22 1051 12/30/22 0150 12/31/22 0450 01/01/23 0252 01/02/23 0056  NA 143 139 138 140 137 138  K 3.8 3.8 3.3* 3.5 3.4* 4.2  CL 106 103 101 104 99 99  CO2 23 28 31 28 28 30   GLUCOSE 184* 319* 219* 236* 227* 134*  BUN 29* 24* 21 17 17 15   CREATININE 0.80 0.73 0.62 0.65 0.71 0.72  CALCIUM 8.1* 7.6* 7.4* 7.3* 7.4* 7.6*  MG 2.0 2.0 2.1 2.0 1.9  --     GFR Estimated Creatinine Clearance: 94.3 mL/min (  by C-G formula based on SCr of 0.72 mg/dL). Liver Function Tests: Recent Labs  Lab 12/27/22 0013 12/29/22 1051 12/30/22 0150 12/31/22 0450 01/01/23 0252  AST 93* 24 18 17 23   ALT 35 17 14 15 12   ALKPHOS 118 97 89 80 88  BILITOT 1.4* 1.2 0.9 0.8 0.6  PROT 4.6* 4.8* 5.0* 4.9* 5.2*  ALBUMIN <1.5* <1.5* <1.5* <1.5* <1.5*    No results for input(s): "LIPASE", "AMYLASE" in the last 168 hours. No results for input(s): "AMMONIA" in the last 168 hours. Coagulation profile No results for input(s): "INR", "PROTIME" in the last 168 hours. COVID-19 Labs  No results for input(s): "DDIMER", "FERRITIN", "LDH", "CRP" in the last 72 hours.  No results found for: "SARSCOV2NAA"  CBC: Recent Labs  Lab 12/29/22 1051 12/30/22 0150 12/31/22 0450 01/01/23 0252 01/02/23 0056  WBC 12.5* 13.4* 14.8* 13.0* 13.7*  NEUTROABS 9.7* 10.3* 12.0*  --   --   HGB 11.7* 11.2* 10.4* 10.7* 10.0*  HCT 36.6* 36.4* 33.6* 34.8* 31.9*  MCV 79.7* 80.7 80.8 80.9 82.6  PLT 112* 120* 158 172 212    Cardiac Enzymes: No results for input(s): "CKTOTAL", "CKMB", "CKMBINDEX", "TROPONINI" in the last 168 hours. BNP (last 3 results) No results for input(s): "PROBNP" in the last 8760 hours. CBG: Recent Labs  Lab 01/01/23 0640 01/01/23 1052 01/01/23 1514 01/01/23 2116 01/02/23 0558  GLUCAP 195* 156* 265* 135* 116*    D-Dimer: No results for input(s): "DDIMER" in the last 72 hours. Hgb  A1c: No results for input(s): "HGBA1C" in the last 72 hours. Lipid Profile: No results for input(s): "CHOL", "HDL", "LDLCALC", "TRIG", "CHOLHDL", "LDLDIRECT" in the last 72 hours. Thyroid function studies: No results for input(s): "TSH", "T4TOTAL", "T3FREE", "THYROIDAB" in the last 72 hours.  Invalid input(s): "FREET3" Anemia work up: No results for input(s): "VITAMINB12", "FOLATE", "FERRITIN", "TIBC", "IRON", "RETICCTPCT" in the last 72 hours. Sepsis Labs: Recent Labs  Lab 12/30/22 0150 12/31/22 0450 01/01/23 0252 01/02/23 0056  WBC 13.4* 14.8* 13.0* 13.7*    Microbiology Recent Results (from the past 240 hour(s))  MRSA Next Gen by PCR, Nasal     Status: None   Collection Time: 12/24/22  4:40 PM   Specimen: Nasal Mucosa; Nasal Swab  Result Value Ref Range Status   MRSA by PCR Next Gen NOT DETECTED NOT DETECTED Final    Comment: (NOTE) The GeneXpert MRSA Assay (FDA approved for NASAL specimens only), is one component of a comprehensive MRSA colonization surveillance program. It is not intended to diagnose MRSA infection nor to guide or monitor treatment for MRSA infections. Test performance is not FDA approved in patients less than 12 years old. Performed at [facility], [address], [place], [place] [address]   Culture, blood (Routine X 2) w Reflex to ID Panel     Status: None   Collection Time: 12/24/22  4:46 PM   Specimen: BLOOD LEFT HAND  Result Value Ref Range Status   Specimen Description BLOOD LEFT HAND  Final   Special Requests   Final    BOTTLES DRAWN AEROBIC AND ANAEROBIC Blood Culture adequate volume   Culture   Final    NO GROWTH 5 DAYS Performed at [facility], [address], [place], [place] [address]    Report Status 12/29/2022 FINAL  Final  Culture, blood (Routine X 2) w Reflex to ID Panel     Status: None   Collection Time: 12/24/22  4:48 PM   Specimen: BLOOD  RIGHT HAND  Result Value Ref Range Status   Specimen Description BLOOD  RIGHT HAND  Final   Special Requests   Final    BOTTLES DRAWN AEROBIC AND ANAEROBIC Blood Culture adequate volume   Culture   Final    NO GROWTH 5 DAYS Performed at [facility], [address] [place], [place] [address]    Report Status 12/29/2022 FINAL  Final     Medications:    acetaminophen  500 mg Oral TID   amiodarone  400 mg Oral BID   Followed by   Melene Muller ON 01/08/2023] amiodarone  200 mg Oral Daily   azelastine  1 spray Each Nare BID   Chlorhexidine Gluconate Cloth  6 each Topical Daily   escitalopram  10 mg Oral Daily   fluticasone  2 spray Each Nare Daily   furosemide  40 mg Intravenous BID   gabapentin  100 mg Oral BID   insulin aspart  0-15 Units Subcutaneous TID WC   insulin aspart  0-5 Units Subcutaneous QHS   insulin aspart  4 Units Subcutaneous TID WC   insulin glargine-yfgn  15 Units Subcutaneous QHS   ketotifen  1 drop Left Eye BID   lidocaine  2 patch Transdermal Q24H   loratadine  10 mg Oral Daily   methocarbamol  500 mg Oral TID   pantoprazole  40 mg Oral BID   rosuvastatin  20 mg Oral Daily   sodium chloride flush  10-40 mL Intracatheter Q12H   sodium chloride flush  3 mL Intravenous Q12H   tamsulosin  0.4 mg Oral Daily   Continuous Infusions:  [START ON 01/11/2023]  ceFAZolin (ANCEF) IV     heparin 2,100 Units/hr (01/02/23 0600)   lactated ringers     nafcillin 12 g in sodium chloride 0.9 % 500 mL continuous infusion 20.8 mL/hr at 01/02/23 0600      LOS: 9 days   Marinda Elk  [facility]  01/02/2023, 7:07 AM

## 2023-01-02 NOTE — Progress Notes (Signed)
   01/02/23 2341  BiPAP/CPAP/SIPAP  Reason BIPAP/CPAP not in use Non-compliant (Refused)

## 2023-01-03 ENCOUNTER — Inpatient Hospital Stay (HOSPITAL_COMMUNITY): Payer: Medicare HMO

## 2023-01-03 DIAGNOSIS — B9561 Methicillin susceptible Staphylococcus aureus infection as the cause of diseases classified elsewhere: Secondary | ICD-10-CM | POA: Diagnosis not present

## 2023-01-03 DIAGNOSIS — Z515 Encounter for palliative care: Secondary | ICD-10-CM | POA: Diagnosis not present

## 2023-01-03 DIAGNOSIS — R509 Fever, unspecified: Secondary | ICD-10-CM

## 2023-01-03 DIAGNOSIS — I4891 Unspecified atrial fibrillation: Secondary | ICD-10-CM | POA: Diagnosis not present

## 2023-01-03 DIAGNOSIS — R7881 Bacteremia: Secondary | ICD-10-CM | POA: Diagnosis not present

## 2023-01-03 DIAGNOSIS — M4646 Discitis, unspecified, lumbar region: Secondary | ICD-10-CM | POA: Diagnosis not present

## 2023-01-03 DIAGNOSIS — L02511 Cutaneous abscess of right hand: Secondary | ICD-10-CM | POA: Diagnosis not present

## 2023-01-03 LAB — CBC
HCT: 32.4 % — ABNORMAL LOW (ref 39.0–52.0)
Hemoglobin: 10.1 g/dL — ABNORMAL LOW (ref 13.0–17.0)
MCH: 26 pg (ref 26.0–34.0)
MCHC: 31.2 g/dL (ref 30.0–36.0)
MCV: 83.5 fL (ref 80.0–100.0)
Platelets: 264 10*3/uL (ref 150–400)
RBC: 3.88 MIL/uL — ABNORMAL LOW (ref 4.22–5.81)
RDW: 16.5 % — ABNORMAL HIGH (ref 11.5–15.5)
WBC: 10.6 10*3/uL — ABNORMAL HIGH (ref 4.0–10.5)
nRBC: 0 % (ref 0.0–0.2)

## 2023-01-03 LAB — HEPARIN LEVEL (UNFRACTIONATED): Heparin Unfractionated: <0.1 IU/mL — ABNORMAL LOW (ref 0.30–0.70)

## 2023-01-03 LAB — BASIC METABOLIC PANEL
Anion gap: 9 (ref 5–15)
BUN: 13 mg/dL (ref 8–23)
CO2: 32 mmol/L (ref 22–32)
Calcium: 7.6 mg/dL — ABNORMAL LOW (ref 8.9–10.3)
Chloride: 98 mmol/L (ref 98–111)
Creatinine, Ser: 0.78 mg/dL (ref 0.61–1.24)
GFR, Estimated: >60 mL/min (ref >60)
Glucose, Bld: 237 mg/dL — ABNORMAL HIGH (ref 70–99)
Potassium: 2.7 mmol/L — CL (ref 3.5–5.1)
Sodium: 139 mmol/L (ref 135–145)

## 2023-01-03 LAB — GLUCOSE, CAPILLARY
Glucose-Capillary: 186 mg/dL — ABNORMAL HIGH (ref 70–99)
Glucose-Capillary: 277 mg/dL — ABNORMAL HIGH (ref 70–99)
Glucose-Capillary: 183 mg/dL — ABNORMAL HIGH (ref 70–99)
Glucose-Capillary: 143 mg/dL — ABNORMAL HIGH (ref 70–99)

## 2023-01-03 LAB — MAGNESIUM: Magnesium: 2 mg/dL (ref 1.7–2.4)

## 2023-01-03 MED ORDER — INSULIN ASPART 100 UNIT/ML IJ SOLN
0.0000 [IU] | Freq: Three times a day (TID) | INTRAMUSCULAR | Status: DC
  Administered 2023-01-03: 3 [IU] via SUBCUTANEOUS
  Administered 2023-01-04 (×2): 2 [IU] via SUBCUTANEOUS
  Administered 2023-01-05: 3 [IU] via SUBCUTANEOUS
  Administered 2023-01-05 – 2023-01-06 (×2): 2 [IU] via SUBCUTANEOUS
  Administered 2023-01-07: 11 [IU] via SUBCUTANEOUS
  Administered 2023-01-07 (×2): 2 [IU] via SUBCUTANEOUS
  Administered 2023-01-08 (×3): 3 [IU] via SUBCUTANEOUS
  Administered 2023-01-09: 2 [IU] via SUBCUTANEOUS
  Administered 2023-01-09: 5 [IU] via SUBCUTANEOUS
  Administered 2023-01-09: 8 [IU] via SUBCUTANEOUS
  Administered 2023-01-11 (×3): 3 [IU] via SUBCUTANEOUS
  Administered 2023-01-12 – 2023-01-13 (×4): 2 [IU] via SUBCUTANEOUS
  Administered 2023-01-13: 5 [IU] via SUBCUTANEOUS
  Administered 2023-01-14 (×2): 3 [IU] via SUBCUTANEOUS
  Administered 2023-01-15: 8 [IU] via SUBCUTANEOUS
  Administered 2023-01-15: 2 [IU] via SUBCUTANEOUS
  Administered 2023-01-15 – 2023-01-16 (×2): 3 [IU] via SUBCUTANEOUS
  Administered 2023-01-16: 8 [IU] via SUBCUTANEOUS
  Administered 2023-01-16: 5 [IU] via SUBCUTANEOUS
  Administered 2023-01-17: 8 [IU] via SUBCUTANEOUS
  Administered 2023-01-17: 2 [IU] via SUBCUTANEOUS
  Administered 2023-01-17: 5 [IU] via SUBCUTANEOUS
  Administered 2023-01-18: 2 [IU] via SUBCUTANEOUS
  Administered 2023-01-18 (×2): 5 [IU] via SUBCUTANEOUS
  Administered 2023-01-19: 2 [IU] via SUBCUTANEOUS
  Administered 2023-01-19: 3 [IU] via SUBCUTANEOUS
  Administered 2023-01-19: 2 [IU] via SUBCUTANEOUS
  Administered 2023-01-20: 3 [IU] via SUBCUTANEOUS
  Administered 2023-01-20: 2 [IU] via SUBCUTANEOUS
  Administered 2023-01-21 (×2): 3 [IU] via SUBCUTANEOUS
  Administered 2023-01-21 – 2023-01-22 (×3): 5 [IU] via SUBCUTANEOUS
  Administered 2023-01-22 – 2023-01-23 (×2): 3 [IU] via SUBCUTANEOUS
  Administered 2023-01-23: 15 [IU] via SUBCUTANEOUS
  Administered 2023-01-23 – 2023-01-24 (×3): 3 [IU] via SUBCUTANEOUS
  Administered 2023-01-24: 5 [IU] via SUBCUTANEOUS
  Administered 2023-01-25 (×3): 3 [IU] via SUBCUTANEOUS
  Administered 2023-01-26: 5 [IU] via SUBCUTANEOUS
  Administered 2023-01-26: 2 [IU] via SUBCUTANEOUS
  Administered 2023-01-26: 5 [IU] via SUBCUTANEOUS
  Administered 2023-01-27: 1 [IU] via SUBCUTANEOUS
  Administered 2023-01-27: 3 [IU] via SUBCUTANEOUS
  Administered 2023-01-27: 5 [IU] via SUBCUTANEOUS
  Administered 2023-01-28: 2 [IU] via SUBCUTANEOUS

## 2023-01-03 MED ORDER — POTASSIUM CHLORIDE CRYS ER 20 MEQ PO TBCR
40.0000 meq | EXTENDED_RELEASE_TABLET | ORAL | Status: AC
  Administered 2023-01-03: 40 meq via ORAL
  Filled 2023-01-03 (×2): qty 2

## 2023-01-03 MED ORDER — INSULIN ASPART 100 UNIT/ML IJ SOLN
0.0000 [IU] | Freq: Every day | INTRAMUSCULAR | Status: DC
  Administered 2023-01-08: 2 [IU] via SUBCUTANEOUS
  Administered 2023-01-10 – 2023-01-14 (×2): 3 [IU] via SUBCUTANEOUS
  Administered 2023-01-15: 2 [IU] via SUBCUTANEOUS
  Administered 2023-01-19: 3 [IU] via SUBCUTANEOUS
  Administered 2023-01-21: 2 [IU] via SUBCUTANEOUS
  Administered 2023-01-24: 3 [IU] via SUBCUTANEOUS
  Administered 2023-01-25: 4 [IU] via SUBCUTANEOUS
  Administered 2023-01-26 – 2023-01-27 (×2): 2 [IU] via SUBCUTANEOUS

## 2023-01-03 MED ORDER — GADOBUTROL 1 MMOL/ML IV SOLN
9.3000 mL | Freq: Once | INTRAVENOUS | Status: AC | PRN
  Administered 2023-01-03: 9.3 mL via INTRAVENOUS

## 2023-01-03 MED ORDER — ENOXAPARIN SODIUM 100 MG/ML IJ SOSY
90.0000 mg | PREFILLED_SYRINGE | Freq: Two times a day (BID) | INTRAMUSCULAR | Status: DC
  Administered 2023-01-03 – 2023-01-13 (×20): 90 mg via SUBCUTANEOUS
  Filled 2023-01-03 (×24): qty 0.9

## 2023-01-03 MED ORDER — POTASSIUM CHLORIDE 10 MEQ/100ML IV SOLN
10.0000 meq | INTRAVENOUS | Status: AC
  Administered 2023-01-03 (×4): 10 meq via INTRAVENOUS
  Filled 2023-01-03 (×4): qty 100

## 2023-01-03 NOTE — Progress Notes (Addendum)
PROGRESS NOTE    Jim Ramirez  UJW:[id number] DOB: [birth year] DOA: 12/24/2022 PCP: [name], MD   Brief Narrative:  Jim Ramirez is an 72 y.o. male past medical history of hypertension and diabetes initially presented to [facility] on 12/22/2022 for chronic back pain radiating both her legs, with some shortness of breath that started 1 week prior to admission CT scan of the abdomen pelvis or lumbar spine discitis and osteomyelitis, transferred to [facility] found to be bacteremic with staph aureus with necrosis of the lumbar spine, also in A-fib with RVR, neurology, ID and cardiology were consulted.  MRI of the wrist was done that showed fluid collection and surgery was consulted who recommended conservative management.  Despite IV antibiotics he continues to spike fevers.    Assessment & Plan:   Severe sepsis secondary to Staph aureus bacteremia in the setting of osteomyelitis and lumbar spine discitis with a pyogenic ventriculitis Encephalopathy: -Blood Cultures at [facility] Grew MSSA Start Empirically on IV Vanco. Surveillance Blood Cultures 12/24/2022 have remain negative to date. -She has been transition to IV nafcillin to cover pyogenic ventriculitis. -ID on board.  TEE on 12/31/2022 showed a preserved EF no vegetation. -Surgery was consulted recommended no surgical intervention. -MRI of the wrist was done that showed about 2 x 2 cm fluid collection.  And a small joint effusion. -Hand surgery was consulted for concerns of right wrist infection, they relate there is likely edema.  Recommended range of motion exercises. -ID recommended 6 weeks of IV nafcillin  from negative cultures on 12/16/2022. -As he continues to spike fevers on antibiotics, ID recommended to repeat MRI of the wrist as well as MRI lumbar spine.   Left wrist edema/concern for tenosynovitis: -MRI show results above. -Orthopedic hand surgery was consulted they had low suspicions for infection recommended range of  motion exercises and continue conservative management. -We will have to repeat the MRI of the wrist as we do not have source control, he continues to spike fevers.   Osteonecrosis of L4-L5: -MRI of the spine more consistent with infarction or necrosis. -Neurosurgery was consulted recommended conservative management. -Informed that there is a chance of vertebral collapse he will need extensive reconstructive surgery. -Neurosurgery order DLCO, gabapentin and continue pain control.   New onset atrial fibrillation: -Cardiology was consulted who started her on amiodarone drip.  Now transition to oral loading. -Neurosurgery is concerned about anticoagulation they recommended to start IV heparin with no bolus. -Continue IV heparin  Hypokalemia: Replenished.  Check magnesium level   Acute kidney injury: -With a baseline creatinine around 0.7 on admission 1.3. -Creatinine has returned to baseline.   Acute thrombocytopenia: -Likely due to infectious etiology now resolved.   Elevated LFTs and hyperbilirubinemia: -Likely due to sepsis physiology now resolved.   Elevate troponin: -Likely demand ischemia.  Patient denies any chest pain.   BPH: -Continue Flomax.   Diabetes mellitus type 2 with hyperglycemia: -With an A1c of 8.1. -Continue sliding scale insulin.  Monitor blood sugar closely   Obstructive sleep apnea: -Continue CPAP at night.   Hyperlipidemia:  -continue statins.   Mood disorder: -Continue Lexapro.   Moderate protein caloric malnutrition: -Noted.   DVT prophylaxis: Heparin Code Status: DNR Family Communication:  None present at bedside.  Plan of care discussed with patient in length and he verbalized understanding and agreed with it.  I discussed plan of care with patient's daughter on the phone and in person.  Patient's daughter was emotional.  She is  concerned about patient's declining health.  I answered all of their questions best of my knowledge.  They were  appreciative.  Disposition Plan: To be determined  Consultants:  ID Ortho Palliative care Neurosurgery Cardiology  Procedures:  None  Status is: Inpatient    Subjective: Patient seen and examined.  Drowsy but arousable.  Answers all questions appropriately.  Tmax: 101.8.  Objective: Vitals:   01/03/23 0000 01/03/23 0354 01/03/23 0400 01/03/23 0704  BP:  (!) 126/51  133/65  Pulse:  75  72  Resp: 18 20 (!) 23 20  Temp:  97.6 F (36.4 C)  98.4 F (36.9 C)  TempSrc:  Oral  Axillary  SpO2: 96% 99% 100%   Weight:      Height:        Intake/Output Summary (Last 24 hours) at 01/03/2023 1042 Last data filed at 01/03/2023 0932 Gross per 24 hour  Intake 1902.74 ml  Output 2575 ml  Net -672.26 ml   Filed Weights   12/31/22 1600 01/01/23 1500  Weight: 93 kg 93 kg    Examination:  General exam: Appears calm and comfortable, on nasal cannula, sleepy but arousable, weak, lethargic Respiratory system: Clear to auscultation. Respiratory effort normal. Cardiovascular system: S1 & S2 heard, RRR. No JVD, murmurs, rubs, gallops or clicks. No pedal edema. Gastrointestinal system: Abdomen is nondistended, soft and nontender. No organomegaly or masses felt. Normal bowel sounds heard. Central nervous system: Sleepy but arousable, following commands.  Oriented to time place and person.   Skin: No rashes, lesions or ulcers Right wrist: Erythematous, swollen and tender to touch.   Data Reviewed: I have personally reviewed following labs and imaging studies  CBC: Recent Labs  Lab 12/29/22 1051 12/30/22 0150 12/31/22 0450 01/01/23 0252 01/02/23 0056 01/03/23 1003  WBC 12.5* 13.4* 14.8* 13.0* 13.7* 10.6*  NEUTROABS 9.7* 10.3* 12.0*  --   --   --   HGB 11.7* 11.2* 10.4* 10.7* 10.0* 10.1*  HCT 36.6* 36.4* 33.6* 34.8* 31.9* 32.4*  MCV 79.7* 80.7 80.8 80.9 82.6 83.5  PLT 112* 120* 158 172 212 264   Basic Metabolic Panel: Recent Labs  Lab 12/28/22 0028 12/29/22 1051  12/30/22 0150 12/31/22 0450 01/01/23 0252 01/02/23 0056  NA 143 139 138 140 137 138  K 3.8 3.8 3.3* 3.5 3.4* 4.2  CL 106 103 101 104 99 99  CO2 23 28 31 28 28 30   GLUCOSE 184* 319* 219* 236* 227* 134*  BUN 29* 24* 21 17 17 15   CREATININE 0.80 0.73 0.62 0.65 0.71 0.72  CALCIUM 8.1* 7.6* 7.4* 7.3* 7.4* 7.6*  MG 2.0 2.0 2.1 2.0 1.9  --    GFR: Estimated Creatinine Clearance: 94.3 mL/min (by C-G formula based on SCr of 0.72 mg/dL). Liver Function Tests: Recent Labs  Lab 12/29/22 1051 12/30/22 0150 12/31/22 0450 01/01/23 0252  AST 24 18 17 23   ALT 17 14 15 12   ALKPHOS 97 89 80 88  BILITOT 1.2 0.9 0.8 0.6  PROT 4.8* 5.0* 4.9* 5.2*  ALBUMIN <1.5* <1.5* <1.5* <1.5*   No results for input(s): "LIPASE", "AMYLASE" in the last 168 hours. No results for input(s): "AMMONIA" in the last 168 hours. Coagulation Profile: No results for input(s): "INR", "PROTIME" in the last 168 hours. Cardiac Enzymes: No results for input(s): "CKTOTAL", "CKMB", "CKMBINDEX", "TROPONINI" in the last 168 hours. BNP (last 3 results) No results for input(s): "PROBNP" in the last 8760 hours. HbA1C: No results for input(s): "HGBA1C" in the last 72 hours. CBG:  Recent Labs  Lab 01/02/23 0558 01/02/23 1313 01/02/23 1646 01/02/23 2108 01/03/23 0559  GLUCAP 116* 195* 139* 176* 186*   Lipid Profile: No results for input(s): "CHOL", "HDL", "LDLCALC", "TRIG", "CHOLHDL", "LDLDIRECT" in the last 72 hours. Thyroid Function Tests: No results for input(s): "TSH", "T4TOTAL", "FREET4", "T3FREE", "THYROIDAB" in the last 72 hours. Anemia Panel: No results for input(s): "VITAMINB12", "FOLATE", "FERRITIN", "TIBC", "IRON", "RETICCTPCT" in the last 72 hours. Sepsis Labs: No results for input(s): "PROCALCITON", "LATICACIDVEN" in the last 168 hours.  Recent Results (from the past 240 hour(s))  MRSA Next Gen by PCR, Nasal     Status: None   Collection Time: 12/24/22  4:40 PM   Specimen: Nasal Mucosa; Nasal Swab   Result Value Ref Range Status   MRSA by PCR Next Gen NOT DETECTED NOT DETECTED Final    Comment: (NOTE) The GeneXpert MRSA Assay (FDA approved for NASAL specimens only), is one component of a comprehensive MRSA colonization surveillance program. It is not intended to diagnose MRSA infection nor to guide or monitor treatment for MRSA infections. Test performance is not FDA approved in patients less than 21 years old. Performed at [facility], [address], [place], [place] [address]   Culture, blood (Routine X 2) w Reflex to ID Panel     Status: None   Collection Time: 12/24/22  4:46 PM   Specimen: BLOOD LEFT HAND  Result Value Ref Range Status   Specimen Description BLOOD LEFT HAND  Final   Special Requests   Final    BOTTLES DRAWN AEROBIC AND ANAEROBIC Blood Culture adequate volume   Culture   Final    NO GROWTH 5 DAYS Performed at [facility], [address] [place], [place] [address]    Report Status 12/29/2022 FINAL  Final  Culture, blood (Routine X 2) w Reflex to ID Panel     Status: None   Collection Time: 12/24/22  4:48 PM   Specimen: BLOOD RIGHT HAND  Result Value Ref Range Status   Specimen Description BLOOD RIGHT HAND  Final   Special Requests   Final    BOTTLES DRAWN AEROBIC AND ANAEROBIC Blood Culture adequate volume   Culture   Final    NO GROWTH 5 DAYS Performed at [facility], [address] [place], [place] [address]    Report Status 12/29/2022 FINAL  Final      Radiology Studies: No results found.  Scheduled Meds:  acetaminophen  500 mg Oral TID   amiodarone  400 mg Oral BID   Followed by   Melene Muller ON 01/08/2023] amiodarone  200 mg Oral Daily   azelastine  1 spray Each Nare BID   Chlorhexidine Gluconate Cloth  6 each Topical Daily   escitalopram  10 mg Oral Daily   fluticasone  2 spray Each Nare Daily   furosemide  40 mg Intravenous BID   gabapentin  100 mg Oral BID   insulin aspart  0-5 Units Subcutaneous QHS   insulin  glargine-yfgn  15 Units Subcutaneous QHS   ketotifen  1 drop Left Eye BID   lidocaine  2 patch Transdermal Q24H   loratadine  10 mg Oral Daily   melatonin  3 mg Oral QHS   methocarbamol  500 mg Oral TID   pantoprazole  40 mg Oral BID   rosuvastatin  20 mg Oral Daily   sodium chloride flush  10-40 mL Intracatheter Q12H   sodium chloride flush  3 mL  Intravenous Q12H   tamsulosin  0.4 mg Oral Daily   Continuous Infusions:  [START ON 01/11/2023]  ceFAZolin (ANCEF) IV     dextrose 5 % and 0.45 % NaCl 75 mL/hr at 01/03/23 0701   heparin 2,450 Units/hr (01/03/23 0701)   lactated ringers     nafcillin 12 g in sodium chloride 0.9 % 500 mL continuous infusion 12 g (01/03/23 0918)     LOS: 10 days   Time spent: 35 minutes   Aaden Buckman Estill Cotta, MD [facility]  If 7PM-7AM, please contact night-coverage [email or web address] 01/03/2023, 10:42 AM

## 2023-01-03 NOTE — Progress Notes (Signed)
   Providing Compassionate, Quality Care - Together   Subjective: Patient resting upon assessment. He is very hard to understand when he speaks as he whispers, with hardly any sound coming out. He shakes his head "no" when asked if he's in pain.  Objective: Vital signs in last 24 hours: Temp:  [97.6 F (36.4 C)-101.8 F (38.8 C)] 98.7 F (37.1 C) (06/07 1100) Pulse Rate:  [72-98] 75 (06/07 1100) Resp:  [18-28] 19 (06/07 1100) BP: (108-136)/(42-65) 108/42 (06/07 1100) SpO2:  [92 %-100 %] 100 % (06/07 0400)  Intake/Output from previous day: 06/06 0701 - 06/07 0700 In: 593.6 [I.V.:445; IV Piggyback:148.6] Out: 2375 [Urine:2375] Intake/Output this shift: Total I/O In: 1319.2 [I.V.:1069.3; IV Piggyback:249.9] Out: 1200 [Urine:1200]  Responds to voice, oriented x 4 PERRLA MAE, Strength grossly intact    Lab Results: Recent Labs    01/02/23 0056 01/03/23 1003  WBC 13.7* 10.6*  HGB 10.0* 10.1*  HCT 31.9* 32.4*  PLT 212 264   BMET Recent Labs    01/02/23 0056 01/03/23 1003  NA 138 139  K 4.2 2.7*  CL 99 98  CO2 30 32  GLUCOSE 134* 237*  BUN 15 13  CREATININE 0.72 0.78  CALCIUM 7.6* 7.6*    Studies/Results: No results found.  Assessment/Plan: Patient with significant back pain with radiation down BLE. The patient is septic with elevated troponin and a-fib with RVR. MRI with evidence of infarction of the L4 and L5 vertebral bodies. This is not consistent with osteomyelitis of the lumbar spine. There is no role for surgery for this issue presently.     LOS: 10 days    -Continue supportive efforts.   Val Eagle, DNP, AGNP-C Nurse Practitioner  [facility] [address], [place], [place] [address] P: [phone]    F: [phone]  01/03/2023, 10:20 AM

## 2023-01-03 NOTE — Progress Notes (Signed)
   01/03/23 2245  BiPAP/CPAP/SIPAP  BiPAP/CPAP/SIPAP Pt Type Adult  BiPAP/CPAP/SIPAP Resmed  Mask Type Full face mask  FiO2 (%) 28 %

## 2023-01-03 NOTE — Progress Notes (Addendum)
ANTICOAGULATION CONSULT NOTE Pharmacy Consult for heparin Indication: atrial fibrillation  No Known Allergies  Patient Measurements: Height: 6' (182.9 cm) Weight: 93 kg (205 lb 0.4 oz) IBW/kg (Calculated) : 77.6 Heparin Dosing Weight: 93kg  Vital Signs: Temp: 98.4 F (36.9 C) (06/07 0704) Temp Source: Axillary (06/07 0704) BP: 133/65 (06/07 0704) Pulse Rate: 72 (06/07 0704)  Labs: Recent Labs    01/01/23 0252 01/01/23 1422 01/02/23 0056 01/02/23 1009 01/02/23 2056 01/03/23 1003  HGB 10.7*  --  10.0*  --   --  10.1*  HCT 34.8*  --  31.9*  --   --  32.4*  PLT 172  --  212  --   --  264  HEPARINUNFRC <0.10*   < > 0.12* <0.10* 0.22* <0.10*  CREATININE 0.71  --  0.72  --   --   --    < > = values in this interval not displayed.     Estimated Creatinine Clearance: 94.3 mL/min (by C-G formula based on SCr of 0.72 mg/dL).  Assessment: 72 y.o. male with new-onset AFib with RVR in the setting of MSSA bacteremia and lumbar spine osteomyelitis and pyogenic ventriculitis. Patient not taking any anticoagulation prior to admission. Pharmacy consulted to dose heparin infusion for AFib. No bolus and lower heparin level goal of 0.3-0.5 per MD.  Heparin level <0.10, remains undetectable Current heparin infusion rate: 2450 units/hr  Hgb 10.1, Plt 264 - stable No s/sx of bleeding or interruptions with heparin infusion per discussion with RN  Goal of Therapy:  Heparin level 0.3-0.5 units/ml Monitor platelets by anticoagulation protocol: Yes   Plan:  Per discussion with Dr. Jacqulyn Bath, will transition to enoxaparin due to concern for possible heparin resistance Discontinue heparin infusion when administering first dose of enoxaparin START enoxaparin 90mg  [place] q12h Monitor daily CBC and for s/sx of bleeding F/u plan to transition to oral [facility] Of note, copay cost of apixaban or rivaroxaban is $11.20/month   Wilburn Cornelia, PharmD, BCPS Clinical Pharmacist 01/03/2023 11:49 AM   Please  refer to AMION for pharmacy phone number

## 2023-01-03 NOTE — Progress Notes (Signed)
Palliative Medicine Progress Note   Patient Name: Jim Ramirez       Date: 01/03/2023 DOB: Aug 08, 1950  Age: 72 y.o. MRN#: [id number] Attending Physician: Ollen Bowl, MD Primary Care Physician: [name], MD Admit Date: 12/24/2022    HPI/Patient Profile: 72 y.o. male  with past medical history of DM and HTN who initially presented to [facility] on 12/22/2022 with complaint of back pain with radiation down both legs.  He was found to have MSSA bacteremia and osteonecrosis of the lumbar spine.  He was also found to have new onset A-fib with RVR.  He was transferred to [facility] on 12/24/2022 for further management of severe sepsis.    Palliative Medicine was consulted for goals of care conversations.  Subjective: Chart reviewed. Note fever yesterday of 101.8 F.   Bedside visit with myself and chaplain Alvino Chapel. We had hoped to clarify patient's choice of health care agent. Unfortunately, patient is quite lethargic at this time. He will open his eyes and attempt to verbalize,  but his speech is barely audible and he is unable to stay awake.  RN is in the room and reports he was more alert this morning.   Objective:  Physical Exam Vitals reviewed.  Constitutional:      General: He is not in acute distress.    Appearance: He is ill-appearing.  Pulmonary:     Effort: Pulmonary effort is normal.  Neurological:     Mental Status: He is lethargic.     Motor: Weakness present.             Vital Signs: BP (!) 130/55 (BP Location: Right Arm)   Pulse 71   Temp 100.2 F (37.9 C) (Axillary) Comment (Src): wearing cpap mask  Resp 19   Ht 6' (1.829 m)   Wt 93 kg   SpO2 100%   BMI 27.81 kg/m  SpO2: SpO2: 100 % O2 Device: O2 Device: Nasal Cannula O2 Flow Rate: O2 Flow Rate (L/min): 2  L/min    Palliative Medicine Assessment & Plan   Assessment: Principal Problem:   Discitis of lumbar region Active Problems:   Diabetes mellitus type 2 in nonobese [facility])   Dyslipidemia   Essential hypertension   New onset atrial fibrillation (HCC)   Sepsis (HCC)   BPH (benign prostatic hyperplasia)  Mood disorder (HCC)   MSSA bacteremia    Recommendations/Plan: Continue supportive interventions Will try again on Monday 6/10 to assist with HCPOA document PMT will continue to follow  Code Status: DNR/DNI   Prognosis:  Very guarded  Discharge Planning: To Be Determined   Thank you for allowing the Palliative Medicine Team to assist in the care of this patient.   MDM - moderate   Merry Proud, NP   Please contact Palliative Medicine Team phone at [phone] for questions and concerns.  For individual providers, please see AMION.

## 2023-01-03 NOTE — Plan of Care (Signed)
  Problem: Education: Goal: Knowledge of General Education information will improve Description: Including pain rating scale, medication(s)/side effects and non-pharmacologic comfort measures Outcome: Progressing   Problem: Clinical Measurements: Goal: Ability to maintain clinical measurements within normal limits will improve Outcome: Progressing Goal: Diagnostic test results will improve Outcome: Progressing Goal: Respiratory complications will improve Outcome: Progressing Goal: Cardiovascular complication will be avoided Outcome: Progressing   Problem: Nutrition: Goal: Adequate nutrition will be maintained Outcome: Progressing   Problem: Coping: Goal: Level of anxiety will decrease Outcome: Progressing   Problem: Elimination: Goal: Will not experience complications related to bowel motility Outcome: Progressing Goal: Will not experience complications related to urinary retention Outcome: Progressing

## 2023-01-03 NOTE — Progress Notes (Signed)
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 Rt wrist edema with tenosynovitis, new Afib with RVR. MRI brain negative for acute infarct, but with pyogenic ventriculitis. Bil UE superficial thrombosis. PMhx: ACDF, HTN, T2DM    PT Comments    Pt received in supine, drowsy but with good participation as able, pt agreeable to therapy session with encouragement. Good participation in supine/seated BLE exercises for strengthening, balance activity and standing trial from elevated surface height. Pt needing increased assist up to +2 maxA this date for transfers and minA for seated balance when back unsupported. Pt c/o mild to moderate LBP only during seated/standing trials, no pain at rest. Reviewed BUE AROM and positioning for edema mgmt, pt will need reinforcement. Pt in bed chair posture with BUE elevated at end of session. Pt continues to benefit from PT services to progress toward functional mobility goals.    Recommendations for follow up therapy are one component of a multi-disciplinary discharge planning process, led by the attending physician.  Recommendations may be updated based on patient status, additional functional criteria and insurance authorization.  Follow Up Recommendations  Can patient physically be transported by private vehicle: No    Assistance Recommended at Discharge Frequent or constant Supervision/Assistance  Patient can return home with the following A lot of help with walking and/or transfers;Assistance with cooking/housework;Assistance with feeding;A lot of help with bathing/dressing/bathroom;Help with stairs or ramp for entrance;Assist for transportation;Direct supervision/assist for financial management   Equipment Recommendations  Hospital bed;Wheelchair (measurements PT);Wheelchair  cushion (measurements PT);BSC/3in1    Recommendations for Other Services       Precautions / Restrictions Precautions Precautions: Back;Fall Precaution Comments: pt educated for back precautiong and brace wear Required Braces or Orthoses: Spinal Brace Spinal Brace: Lumbar corset;Applied in sitting position Restrictions Weight Bearing Restrictions: No     Mobility  Bed Mobility Overal bed mobility: Needs Assistance Bed Mobility: Rolling, Sidelying to Sit, Sit to Sidelying Rolling: Max assist Sidelying to sit: Max assist, HOB elevated     Sit to sidelying: Max assist, +2 for physical assistance General bed mobility comments: mod assist to roll bil x 2, mod assist to clear legs and elevate trunk to EOB with max cues    Transfers Overall transfer level: Needs assistance Equipment used: 2 person hand held assist Transfers: Sit to/from Stand Sit to Stand: From elevated surface, +2 physical assistance, Max assist          Lateral/Scoot Transfers: Total assist, Max assist, +2 safety/equipment General transfer comment: maxA +2 to stand from EOB with face to face, bil feet/knees blocked and +2 HHA behind therapist/RN forearms. Pt with posterior bias and quick to fatigue, standing <45 seconds. Defer pivotal transfer OOB to chair given pt apparent fatigue but pt agreeable to bed chair posture and BUE elevation for edema mgmt. Pt max to totalA +2 for lateral seated scooting along EOB toward HOB.    Ambulation/Gait             Pre-gait activities: pt unable to weight shift/march in stance with +2 assist       Balance Overall balance assessment: Needs assistance Sitting-balance support: Bilateral upper extremity supported, Feet supported Sitting balance-Leahy Scale: Poor Sitting balance - Comments: posterior LOB with seated LE ROM exercises (LAQ) needing minA trunk support consistently for safety/stabiltiy Postural control: Posterior lean Standing balance support:  Bilateral  upper extremity supported Standing balance-Leahy Scale: Zero Standing balance comment: +2 maxA static standing                            Cognition Arousal/Alertness: Awake/alert Behavior During Therapy: WFL for tasks assessed/performed Overall Cognitive Status: Impaired/Different from baseline Area of Impairment: Orientation, Memory                 Orientation Level: Time, Disoriented to   Memory: Decreased short-term memory, Decreased recall of precautions         General Comments: poor carryover of cues for safety with bed mobility and pt needs dense multimodal cues for proper sequencing of UE/LE exercises during supine/seated exercise tasks. Slow processing throughout.        Exercises General Exercises - Lower Extremity Ankle Circles/Pumps: AROM, Both, 10 reps, Supine Long Arc Quad: AROM, Both, 10 reps, Seated Heel Slides: AROM, Both, 10 reps, Seated Hip ABduction/ADduction: AAROM, Both, 5 reps, Supine Hip Flexion/Marching: AROM, Both, 5 reps (bed chair posture, HOB reclined so pt not flexing past 90* with movement) BUE AROM: gross grasp/finger extension and wrist flex/ext x10 reps ea x2 sets pt encouraged to perform hourly as able.   General Comments General comments (skin integrity, edema, etc.): VSS on 1L O2 [place]      Pertinent Vitals/Pain Pain Assessment Pain Assessment: Faces Faces Pain Scale: Hurts little more Pain Location: back with movement, none at rest Pain Descriptors / Indicators: Guarding, Grimacing Pain Intervention(s): Limited activity within patient's tolerance, Monitored during session, Repositioned           PT Goals (current goals can now be found in the care plan section) Acute Rehab PT Goals Patient Stated Goal: return to walking PT Goal Formulation: With patient Time For Goal Achievement: 01/14/23 Progress towards PT goals: Progressing toward goals    Frequency    Min 1X/week      PT Plan Current plan remains  appropriate       AM-PAC PT "6 Clicks" Mobility   Outcome Measure  Help needed turning from your back to your side while in a flat bed without using bedrails?: A Lot Help needed moving from lying on your back to sitting on the side of a flat bed without using bedrails?: Total Help needed moving to and from a bed to a chair (including a wheelchair)?: Total Help needed standing up from a chair using your arms (e.g., wheelchair or bedside chair)?: Total Help needed to walk in hospital room?: Total Help needed climbing 3-5 steps with a railing? : Total 6 Click Score: 7    End of Session Equipment Utilized During Treatment: Gait belt;Back brace;Oxygen Activity Tolerance: Patient limited by fatigue Patient left: in bed;with call bell/phone within reach;with bed alarm set;Other (comment) (defer OOB to chair given pt lethargy and close to shift change, pt in bed chair posture with BUE elevated) Nurse Communication: Mobility status;Need for lift equipment;Precautions;Other (comment) (Stedy vs hoyer for OOB safely today) PT Visit Diagnosis: Other abnormalities of gait and mobility (R26.89);Muscle weakness (generalized) (M62.81)     Time: 1610-9604 PT Time Calculation (min) (ACUTE ONLY): 30 min  Charges:  $Therapeutic Exercise: 8-22 mins $Therapeutic Activity: 8-22 mins                     Rashea Hoskie P., PTA Acute Rehabilitation Services Secure Chat Preferred 9a-5:30pm Office: [phone]    Dorathy Kinsman [facility] 01/03/2023, 6:56 PM

## 2023-01-03 NOTE — Progress Notes (Signed)
This chaplain is present with the Pt., Pt. RN-Justin, and PMT NP-Julia for assessing the Pt. ability to move forward in completing his HCPOA.  The chaplain observed the Pt. inability to stay awake, acknowledge the presence of the medical team, and participate in meaningful conversation. This chaplain will work with PMT to coordinate an appropriate time for a revisit.  Chaplain Stephanie Acre [phone]

## 2023-01-03 NOTE — TOC Progression Note (Signed)
Transition of Care (TOC) - Progression Note  Donn Pierini RN,BSN Transitions of Care Unit 4NP (Non Trauma)- RN Case Manager See Treatment Team for direct Phone # [phone] cross Coverage  Patient Details  Name: TEXAS SOUTER MRN: [id number] Date of Birth: 03-27-1951  Transition of Care [facility]) CM/SW Contact  Zenda Alpers Lenn Sink, RN Phone Number: 01/03/2023, 3:16 PM  Clinical Narrative:    Follow up done with attending regarding possible LTACH referral. Per attending Dr. Estill Cotta pt is spiking fevers and not stable currently for [facility] referral. MD requesting that TOC hold off on LTACH referral at this time- will readdress on Monday.         Expected Discharge Plan and Services               Unknown                                Social Determinants of Health (SDOH) Interventions SDOH Screenings   Transportation Needs: No Transportation Needs (12/26/2022)  Utilities: Not At Risk (12/26/2022)  Tobacco Use: Medium Risk (01/01/2023)    Readmission Risk Interventions     No data to display

## 2023-01-03 NOTE — Progress Notes (Addendum)
[facility]    Date of Admission:  12/24/2022   Total days of antibiotics 11/day 7 nafcillin           ID: Jim Ramirez is a 72 y.o. male with  Principal Problem:   Discitis of lumbar region Active Problems:   Diabetes mellitus type 2 in nonobese (HCC)   Dyslipidemia   Essential hypertension   New onset atrial fibrillation (HCC)   Sepsis (HCC)   BPH (benign prostatic hyperplasia)   Mood disorder (HCC)   MSSA bacteremia    Subjective: Had high fever of 101.27F yesterday, received tylenol. This morning he is groggy, slow to respond to questions/commands. No diarrhea. Right hand tenderness  Medications:   acetaminophen  500 mg Oral TID   amiodarone  400 mg Oral BID   Followed by   Melene Muller ON 01/08/2023] amiodarone  200 mg Oral Daily   azelastine  1 spray Each Nare BID   Chlorhexidine Gluconate Cloth  6 each Topical Daily   escitalopram  10 mg Oral Daily   fluticasone  2 spray Each Nare Daily   furosemide  40 mg Intravenous BID   gabapentin  100 mg Oral BID   insulin aspart  0-5 Units Subcutaneous QHS   insulin glargine-yfgn  15 Units Subcutaneous QHS   ketotifen  1 drop Left Eye BID   lidocaine  2 patch Transdermal Q24H   loratadine  10 mg Oral Daily   melatonin  3 mg Oral QHS   methocarbamol  500 mg Oral TID   pantoprazole  40 mg Oral BID   rosuvastatin  20 mg Oral Daily   sodium chloride flush  10-40 mL Intracatheter Q12H   sodium chloride flush  3 mL Intravenous Q12H   tamsulosin  0.4 mg Oral Daily    Objective: Vital signs in last 24 hours: Temp:  [97.6 F (36.4 C)-101.8 F (38.8 C)] 98.4 F (36.9 C) (06/07 0704) Pulse Rate:  [72-98] 72 (06/07 0704) Resp:  [18-28] 20 (06/07 0704) BP: (118-136)/(49-65) 133/65 (06/07 0704) SpO2:  [92 %-100 %] 100 % (06/07 0400)  Physical Exam  Constitutional: He is oriented to person, place, and time. He appears well-developed and well-nourished. No distress.  HENT:  Mouth/Throat: Oropharynx  is clear and moist. No oropharyngeal exudate.  Cardiovascular: Normal rate, regular rhythm and normal heart sounds. Exam reveals no gallop and no friction rub.  No murmur heard.  Pulmonary/Chest: Effort normal and breath sounds normal. No respiratory distress. He has no wheezes.  Abdominal: Soft. Bowel sounds are normal. He exhibits no distension. There is no tenderness.  Lymphadenopathy:  He has no cervical adenopathy.  Neurological: He is alert and oriented to person, place, and time.  Skin: Skin is warm and dry. No rash noted. No erythema.  Psychiatric: He has a normal mood and affect. His behavior is normal.    Lab Results Recent Labs    01/01/23 0252 01/02/23 0056 01/03/23 1003  WBC 13.0* 13.7* 10.6*  HGB 10.7* 10.0* 10.1*  HCT 34.8* 31.9* 32.4*  NA 137 138  --   K 3.4* 4.2  --   CL 99 99  --   CO2 28 30  --   BUN 17 15  --   CREATININE 0.71 0.72  --    Liver Panel Recent Labs    01/01/23 0252  PROT 5.2*  ALBUMIN <1.5*  AST 23  ALT 12  ALKPHOS 88  BILITOT 0.6  Microbiology: MSSA from outside hospital Studies/Results: No results found.   Assessment/Plan: Ongoing fevers= Concern that we may not have source control. Recommend to do repeat mri of lumbar spine w wo contrast to see if worsening  Right hand abscess = please see if ortho to re-evaluate for I x D. Right hand/wrist MRI pending  MSSA bacteremia/lumbar discitis/ventriculitis = continue on nafcillin  [facility] for Infectious Diseases Pager: [phone]  01/03/2023, 11:23 AM

## 2023-01-03 NOTE — Evaluation (Signed)
Evaluation and documentation completed by Verlon Setting, but note populated by [facility]  Clinical/Bedside Swallow Evaluation Patient Details  Name: Jim Ramirez MRN: [id number] Date of Birth: July 05, 1951  Today's Date: 01/03/2023 Time: SLP Start Time (ACUTE ONLY): 1111 SLP Stop Time (ACUTE ONLY): 1128 SLP Time Calculation (min) (ACUTE ONLY): 17 min  Past Medical History:  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   CTA negative for aneurysm   Depressed    Diabetes mellitus without complication (HCC)    DVT (deep venous thrombosis) (HCC)    History of kidney stones    Hypertension    Nephrolithiasis    Sleep apnea    Past Surgical History:  Past Surgical History:  Procedure Laterality Date   ANTERIOR CERVICAL DECOMP/DISCECTOMY FUSION N/A 05/22/2022   Procedure: ANTERIOR CERVICAL DISCECTOMY FUSION, INTERBODY PROSTHESIS,PLATE/SCREWS CERVICAL THREE-FOUR, CERVICAL FOUR-FIVE;REMOVAL CERVICAL PLATE;  Surgeon: Tressie Stalker, MD;  Location: [facility] OR;  Service: Neurosurgery;  Laterality: N/A;  3C   CATARACT EXTRACTION W/PHACO Left 09/23/2022   Procedure: CATARACT EXTRACTION PHACO AND INTRAOCULAR LENS PLACEMENT (IOC);  Surgeon: Fabio Pierce, MD;  Location: [facility] ORS;  Service: Ophthalmology;  Laterality: Left;  CDE: 7.52   CATARACT EXTRACTION W/PHACO Right 10/07/2022   Procedure: CATARACT EXTRACTION PHACO AND INTRAOCULAR LENS PLACEMENT (IOC);  Surgeon: Fabio Pierce, MD;  Location: [facility] ORS;  Service: Ophthalmology;  Laterality: Right;  CDE: 9.40   STOMACH SURGERY     TEE WITHOUT CARDIOVERSION N/A 12/31/2022   Procedure: TRANSESOPHAGEAL ECHOCARDIOGRAM;  Surgeon: Little Ishikawa, MD;  Location: [facility] INVASIVE CV LAB;  Service: Cardiovascular;  Laterality: N/A;   TONSILLECTOMY  2000   HPI:  Jim Ramirez is a 72 yo male who initially presented to [facility] on 5/26 with c/o chronic back pain with radiation down both  legs, SOB x 1 week.  Found to have Staph aureus bacteremia with osteonecrosis of the lumbar spine.  Also had A-fib with RVR.  Neurosurgery, ID, cardiology following. CXR 5/30 with no acute findings.  MRI No evidence of acute or subacute infarct.Small foci of restricted diffusion with ADC correlate in the  bilateral occipital horns, concerning for pyogenic ventriculitis. Pt with PMH of HTN and DM. SLP performed a FEES 12/28/22 without penetration/aspiration and recommended Dys 3, thin. Followed up and discharged. ST reconsulted as pt confused 6/6 and question of aspiration. No recent CXR.    Assessment / Plan / Recommendation  Clinical Impression  Pt presents similar to previous evaluations/sessions with this SLP. He was slightly more groggy today (may be related to pain meds). Upper and lower dentures donned prior to po consumption of applesauce, thin water and solid. Mastication was mildly prolonged with solid and able to clear oral cavity over multiple trials. No s/s aspiration wtih straw sips thin. Recommend he resume Dys 3/thin liquids, pills whole in puree. ST will follow briefly for efficiency with recommendations and upgrade if able. SLP Visit Diagnosis: Dysphagia, unspecified (R13.10)    Aspiration Risk  Mild aspiration risk    Diet Recommendation Dysphagia 3 (Mech soft);Thin liquid   Medication Administration: Whole meds with puree Supervision: Staff to assist with self feeding Compensations: Slow rate;Small sips/bites;Minimize environmental distractions    Other  Recommendations Oral Care Recommendations: Oral care BID    Recommendations for follow up therapy are one component of a multi-disciplinary discharge planning process, led by the attending physician.  Recommendations may be updated based on patient status, additional functional criteria and insurance authorization.  Follow up Recommendations No SLP follow up      Assistance Recommended at Discharge    Functional Status  Assessment Patient has had a recent decline in their functional status and demonstrates the ability to make significant improvements in function in a reasonable and predictable amount of time.  Frequency and Duration min 2x/week  2 weeks       Prognosis Prognosis for improved oropharyngeal function: Good      Swallow Study   General Date of Onset: 12/22/22 HPI: Jim Ramirez is a 72 yo male who initially presented to [facility] on 5/26 with c/o chronic back pain with radiation down both legs, SOB x 1 week.  Found to have Staph aureus bacteremia with osteonecrosis of the lumbar spine.  Also had A-fib with RVR.  Neurosurgery, ID, cardiology following. CXR 5/30 with no acute findings.  MRI No evidence of acute or subacute infarct.Small foci of restricted diffusion with ADC correlate in the  bilateral occipital horns, concerning for pyogenic ventriculitis. Pt with PMH of HTN and DM. SLP performed a FEES 12/28/22 without penetration/aspiration and recommended Dys 3, thin. Followed up and discharged. ST reconsulted as pt confused 6/6 and question of aspiration. No recent CXR. Previous Swallow Assessment:  (see HPI) Diet Prior to this Study: NPO Respiratory Status: Nasal cannula History of Recent Intubation: No Behavior/Cognition: Pleasant mood;Cooperative;Other (Comment) (awake, little groggy- on morphine) Oral Care Completed by SLP: No Oral Cavity - Dentition: Dentures, top;Dentures, bottom Vision: Functional for self-feeding Baseline Vocal Quality: Normal    Oral/Motor/Sensory Function     Ice Chips Ice chips: Not tested   Thin Liquid Thin Liquid: Within functional limits Presentation: Straw Oral Phase Impairments:  (none) Pharyngeal  Phase Impairments:  (none)    Nectar Thick Nectar Thick Liquid: Not tested   Honey Thick Honey Thick Liquid: Not tested   Puree Puree: Within functional limits   Solid     Solid: Impaired Oral Phase Functional Implications: Prolonged oral transit       Royce Macadamia

## 2023-01-03 NOTE — Progress Notes (Signed)
In regards to pt's ordered MRIs, able to obtain complete L Spine W WO but unable to obtain any wrist imaging as pt could not keep wrist still enough to obtain any diagnostic images. Tried to redirect pt but wrist motion only got worse. Pt will need to wait 12 hours between contrast injections if another attempt is to be made at wrist.

## 2023-01-03 NOTE — Progress Notes (Signed)
Placed patient on cpap machine, resting comfortably in no distress.

## 2023-01-04 ENCOUNTER — Inpatient Hospital Stay (HOSPITAL_COMMUNITY): Payer: Medicare HMO

## 2023-01-04 DIAGNOSIS — M4646 Discitis, unspecified, lumbar region: Secondary | ICD-10-CM | POA: Diagnosis not present

## 2023-01-04 LAB — CBC
HCT: 30 % — ABNORMAL LOW (ref 39.0–52.0)
Hemoglobin: 9.2 g/dL — ABNORMAL LOW (ref 13.0–17.0)
MCH: 25.6 pg — ABNORMAL LOW (ref 26.0–34.0)
MCHC: 30.7 g/dL (ref 30.0–36.0)
MCV: 83.6 fL (ref 80.0–100.0)
Platelets: 286 10*3/uL (ref 150–400)
RBC: 3.59 MIL/uL — ABNORMAL LOW (ref 4.22–5.81)
RDW: 16.3 % — ABNORMAL HIGH (ref 11.5–15.5)
WBC: 8.5 10*3/uL (ref 4.0–10.5)
nRBC: 0 % (ref 0.0–0.2)

## 2023-01-04 LAB — BASIC METABOLIC PANEL
Anion gap: 9 (ref 5–15)
BUN: 15 mg/dL (ref 8–23)
CO2: 32 mmol/L (ref 22–32)
Calcium: 7.7 mg/dL — ABNORMAL LOW (ref 8.9–10.3)
Chloride: 99 mmol/L (ref 98–111)
Creatinine, Ser: 0.78 mg/dL (ref 0.61–1.24)
GFR, Estimated: >60 mL/min (ref >60)
Glucose, Bld: 146 mg/dL — ABNORMAL HIGH (ref 70–99)
Potassium: 3 mmol/L — ABNORMAL LOW (ref 3.5–5.1)
Sodium: 140 mmol/L (ref 135–145)

## 2023-01-04 LAB — IRON AND TIBC
Iron: 9 ug/dL — ABNORMAL LOW (ref 45–182)
Saturation Ratios: 7 % — ABNORMAL LOW (ref 17.9–39.5)
TIBC: 134 ug/dL — ABNORMAL LOW (ref 250–450)
UIBC: 125 ug/dL

## 2023-01-04 LAB — GLUCOSE, CAPILLARY
Glucose-Capillary: 124 mg/dL — ABNORMAL HIGH (ref 70–99)
Glucose-Capillary: 145 mg/dL — ABNORMAL HIGH (ref 70–99)
Glucose-Capillary: 120 mg/dL — ABNORMAL HIGH (ref 70–99)
Glucose-Capillary: 104 mg/dL — ABNORMAL HIGH (ref 70–99)

## 2023-01-04 LAB — MAGNESIUM: Magnesium: 1.9 mg/dL (ref 1.7–2.4)

## 2023-01-04 MED ORDER — FERROUS SULFATE 325 (65 FE) MG PO TABS
325.0000 mg | ORAL_TABLET | Freq: Every day | ORAL | Status: DC
  Administered 2023-01-05 – 2023-01-27 (×23): 325 mg via ORAL
  Filled 2023-01-04 (×24): qty 1

## 2023-01-04 MED ORDER — GADOBUTROL 1 MMOL/ML IV SOLN
10.0000 mL | Freq: Once | INTRAVENOUS | Status: AC | PRN
  Administered 2023-01-04: 10 mL via INTRAVENOUS

## 2023-01-04 MED ORDER — LORAZEPAM 1 MG PO TABS
1.0000 mg | ORAL_TABLET | Freq: Once | ORAL | Status: AC | PRN
  Administered 2023-01-04: 1 mg via ORAL
  Filled 2023-01-04: qty 1

## 2023-01-04 MED ORDER — POTASSIUM CHLORIDE 10 MEQ/50ML IV SOLN
10.0000 meq | INTRAVENOUS | Status: DC
  Administered 2023-01-04 (×2): 10 meq via INTRAVENOUS
  Filled 2023-01-04 (×2): qty 50

## 2023-01-04 MED ORDER — POTASSIUM CHLORIDE 10 MEQ/100ML IV SOLN
10.0000 meq | INTRAVENOUS | Status: AC
  Administered 2023-01-04 (×4): 10 meq via INTRAVENOUS
  Filled 2023-01-04 (×4): qty 100

## 2023-01-04 NOTE — Progress Notes (Signed)
Subjective: The patient is alert and pleasant.  Objective: Vital signs in last 24 hours: Temp:  [97.9 F (36.6 C)-100.7 F (38.2 C)] 97.9 F (36.6 C) (06/08 0756) Pulse Rate:  [71-75] 72 (06/08 0311) Resp:  [18-21] 20 (06/08 0756) BP: (91-131)/(42-55) 131/55 (06/08 0756) SpO2:  [94 %-100 %] 97 % (06/08 0756) FiO2 (%):  [28 %] 28 % (06/07 2245) Estimated body mass index is 27.81 kg/m as calculated from the following:   Height as of this encounter: 6' (1.829 m).   Weight as of this encounter: 93 kg.   Intake/Output from previous day: 06/07 0701 - 06/08 0700 In: 3001.9 [P.O.:240; I.V.:1701; IV Piggyback:1060.9] Out: 2450 [Urine:2450] Intake/Output this shift: Total I/O In: 287.5 [P.O.:240; IV Piggyback:47.5] Out: -   Physical exam patient is alert and pleasant.  His strength is grossly normal in his lower extremities.  I have reviewed the patient's lumbar MRI performed yesterday.  It is now consistent with infection.  He has paravertebral small abscesses,  ventral epidural abscess which has progressed compared with his prior MRI.  Lab Results: Recent Labs    01/03/23 1003 01/04/23 0430  WBC 10.6* 8.5  HGB 10.1* 9.2*  HCT 32.4* 30.0*  PLT 264 286   BMET Recent Labs    01/03/23 1003 01/04/23 0430  NA 139 140  K 2.7* 3.0*  CL 98 99  CO2 32 32  GLUCOSE 237* 146*  BUN 13 15  CREATININE 0.78 0.78  CALCIUM 7.6* 7.7*    Studies/Results: MR Lumbar Spine W Wo Contrast  Result Date: 01/03/2023 CLINICAL DATA:  Follow-up examination for infection. History of MSSA bacteremia EXAM: MRI LUMBAR SPINE WITHOUT AND WITH CONTRAST TECHNIQUE: Multiplanar and multiecho pulse sequences of the lumbar spine were obtained without and with intravenous contrast. CONTRAST:  9.81mL GADAVIST GADOBUTROL 1 MMOL/ML IV SOLN COMPARISON:  Comparison made with recent MRI from 12/25/2022. FINDINGS: Segmentation: Standard. Lowest well-formed disc space labeled the L5-S1 level. Alignment: Physiologic  with preservation of the normal lumbar lordosis. No interval listhesis. Vertebrae: Heterogeneous precontrast T1 and T2 signal intensity, with mildly increased STIR signal intensity again seen within the L4 and L5 vertebral bodies. Marked lack of associated enhancement following contrast administration, similar to prior (series 10, image 8). Partial extension to involve the left pedicle at L5. Finding again consistent with osteonecrosis with infarction. Vertebral body height is maintained without interval collapse. Abnormal fluid signal intensity seen within the L4-5 interspace. Progressive surrounding paraspinous inflammatory changes within the bilateral psoas musculature, left greater than right (series 8, image 28). Few small loculated collections are no evident within both psoas muscles largest of which measures 1.4 cm on the right (series 11, image 28). Given this, there is presumed superimposed infection, likely related to discitis at the L4-5 interspace. Additionally, there is progressive inflammatory changes about the right L4-5 facet, consistent with acute septic arthritis. Few loculated collections within the adjacent right posterior paraspinous musculature measure up to 1.3 cm (series 11, image 26). Again seen is abnormal appearance of the ventral epidural space, extending from L2-3 through L5-S1 (series 5, image 9). Given the concomitant inflammatory changes about the paraspinous soft tissues, findings raise the concern for and epidural abscess. This extends from L2-3 through L5-S1, and measures approximately 0.9 x 2.0 x 8.4 cm in maximal dimensions (AP by transverse by craniocaudad). Additionally, there is progressive enhancement about the dorsal epidural space extending from L2-3 through L5-S1 without well-formed abscess or collection. No other evidence for new or distant infection elsewhere within  the lumbar spine. Conus medullaris and cauda equina: Conus extends to the L1 level. Conus within normal  limits. Nerve roots of the cauda equina within normal limits. Paraspinal and other soft tissues: Paraspinous inflammatory changes about the lower lumbar spine as above. 1.2 cm simple cyst present at the lower pole the left kidney, benign in appearance, no follow-up imaging recommended. Disc levels: L1-2: Disc desiccation with diffuse disc bulge. Mild to moderate left greater than right facet hypertrophy. No spinal stenosis. Foramina remain patent. L2-3: Diffuse disc bulge with endplate spurring. Moderate bilateral facet hypertrophy. Small epidural collection within the ventral epidural space. Resultant moderate spinal stenosis. Mild to moderate bilateral L2 foraminal narrowing. L3-4: Disc desiccation with diffuse disc bulge. Mild facet hypertrophy. Ventral epidural collection. Mild to moderate spinal stenosis. Moderate right with mild-to-moderate left L3 foraminal narrowing. L4-5: Intervertebral disc space narrowing. Changes of osteonecrosis/infarct about the L4 and L5 vertebral bodies with superimposed findings concerning for discitis as above. Underlying disc bulge with endplate spurring. Mild facet hypertrophy. Ventral epidural collection. Associated moderate to severe canal with bilateral subarticular stenosis, similar to prior. Moderate right worse than left L4 foraminal narrowing. L5-S1: Diffuse disc bulge with disc desiccation. Associated reactive endplate spurring. Disc bulging asymmetric to the left with associated left-sided annular fissure. No significant spinal stenosis. Mild bilateral L5 foraminal narrowing. IMPRESSION: 1. 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 2. Progressive superimposed changes concerning for infection with probable discitis at L4-5, with evidence for septic arthritis at the right L4-5 facet. Progressive associated paraspinous inflammatory changes, including multiple small loculated collections within the bilateral psoas  muscles and right posterior paraspinous soft tissues as above. 3. Continued abnormal appearance of the ventral epidural space extending from L2-3 through L5-S1, mildly worsened from prior. Again, while this may in part be related to basivertebral venous thrombosis related to osteonecrosis, possible epidural abscess is difficult to exclude given the progressive inflammatory changes elsewhere within this region. 4. Underlying multilevel lumbar spondylosis with up to severe spinal stenosis at L4-5. Electronically Signed   By: Rise Mu M.D.   On: 01/03/2023 22:29    Assessment/Plan: Lumbar discitis, osteomyelitis, epidural abscess, septic facet: ID is following this patient.  Unfortunately his lumbar infection has progressed despite antibiotics.  This does not portend well.  I will leave it up to IDs expertise whether they think he should have a needle aspiration and culture of his paravertebral small abscesses to make sure he is on the right antibiotics.  At this point I do not think he would benefit from drainage of his epidural abscess as he is not having neurologic compromise.  I do not think that drainage of his ventral or epidural abscess will change his prognosis if he is not responding to antibiotics.  LOS: 11 days     Cristi Loron 01/04/2023, 8:51 AM     Patient ID: Jim Ramirez, male   DOB: 12/18/1950, 72 y.o.   MRN: [id number]

## 2023-01-04 NOTE — Progress Notes (Addendum)
PROGRESS NOTE    Jim Ramirez  ZOX:[id number] DOB: 1951/03/21 DOA: 12/24/2022 PCP: [name], MD   Brief Narrative:  Jim Ramirez is an 72 y.o. male past medical history of hypertension and diabetes initially presented to [facility] on 12/22/2022 for chronic back pain radiating both her legs, with some shortness of breath that started 1 week prior to admission CT scan of the abdomen pelvis or lumbar spine discitis and osteomyelitis, transferred to [facility] found to be bacteremic with staph aureus with necrosis of the lumbar spine, also in A-fib with RVR, neurology, ID and cardiology were consulted.  MRI of the wrist was done that showed fluid collection and surgery was consulted who recommended conservative management.  Despite IV antibiotics he continues to spike fevers.    Assessment & Plan:   Severe sepsis secondary to Staph aureus bacteremia in the setting of osteomyelitis and lumbar spine discitis with a pyogenic ventriculitis Encephalopathy: -Blood Cultures at [facility] Grew MSSA Start Empirically on IV Vanco. Surveillance Blood Cultures 12/24/2022 have remain negative to date. -ID on board.  TEE on 12/31/2022 showed a preserved EF no vegetation. -MRI of the wrist was done that showed about 2 x 2 cm fluid collection.  And a small joint effusion.  Hand surgery recommended symptomatic treatment  -ID recommended 6 weeks of IV nafcillin  from negative cultures on 12/16/2022. -As he continues to spike fevers on antibiotics, he underwent repeat lumbar MRI on 6/7 that shows worsening of infection/concern for epidural abscess.  Neurosurgery do not think he would get benefit from drainage since he does not have neurological compromise.  left wrist edema/concern for tenosynovitis: -MRI show results above. -Orthopedic hand surgery was consulted they had low suspicions for infection recommended range of motion exercises and continue conservative management. -We will have to repeat the MRI of  the wrist as we do not have source control, he continues to spike fevers.   Osteonecrosis of L4-L5: -MRI of the spine more consistent with infarction or necrosis. -Neurosurgery was consulted recommended conservative management. -Informed that there is a chance of vertebral collapse he will need extensive reconstructive surgery. -Neurosurgery order DLCO, gabapentin and continue pain control.   New onset atrial fibrillation: -Cardiology was consulted who started her on amiodarone drip.  Now transition to oral loading. -Neurosurgery recommended IV heparin.  Heparin was switched to Lovenox due to possible heparin resistant. -Will transition to DOAC once stable  Iron deficiency anemia: H&H dropped from 11-9.  No signs of bleeding. -Iron panel consistent of iron deficiency anemia.  Will start on iron supplements.  Hypokalemia: Replenished.  Check magnesium level   Acute kidney injury: -With a baseline creatinine around 0.7 on admission 1.3. -Creatinine has returned to baseline.   Acute thrombocytopenia: -Likely due to infectious etiology now resolved.   Elevated LFTs and hyperbilirubinemia: -Likely due to sepsis physiology now resolved.   Elevate troponin: -Likely demand ischemia.  Patient denies any chest pain.   BPH: -Continue Flomax.   Diabetes mellitus type 2 with hyperglycemia: -With an A1c of 8.1. -Continue sliding scale insulin.  Monitor blood sugar closely   Obstructive sleep apnea: -Continue CPAP at night.   Hyperlipidemia:  -continue statins.   Mood disorder: -Continue Lexapro.   Moderate protein caloric malnutrition: -Noted.   DVT prophylaxis: Heparin Code Status: DNR Family Communication:  None present at bedside.  Plan of care discussed with patient in length and he verbalized understanding and agreed with it.  I discussed plan of care with patient's  daughter on the phone and in person on 6/7.  Family is aware of poor prognosis  6/8-called patient's  daughter-no response, left VM  Disposition Plan: To be determined  Consultants:  ID Ortho Palliative care Neurosurgery Cardiology  Procedures:  None  Status is: Inpatient    Subjective: Patient seen and examined.  More alert this morning.  His right hand continues to have swelling and redness.  Complaining of some back pain.  Tmax: 100.7.  Objective: Vitals:   01/03/23 2054 01/03/23 2257 01/04/23 0311 01/04/23 0756  BP: (!) 130/55 (!) 98/45 (!) 122/44 (!) 131/55  Pulse: 73 71 72   Resp: 19 18 20 20   Temp: 99.1 F (37.3 C) 100.2 F (37.9 C) (!) 100.7 F (38.2 C) 97.9 F (36.6 C)  TempSrc: Oral Axillary Axillary Oral  SpO2: 100% 95% 94% 97%  Weight:      Height:        Intake/Output Summary (Last 24 hours) at 01/04/2023 1327 Last data filed at 01/04/2023 1000 Gross per 24 hour  Intake 1970.18 ml  Output 1670 ml  Net 300.18 ml    Filed Weights   12/31/22 1600 01/01/23 1500  Weight: 93 kg 93 kg    Examination:  General exam: Appears calm and comfortable, on nasal cannula, sleepy but arousable, weak, lethargic, sick Respiratory system: Clear to auscultation. Respiratory effort normal. Cardiovascular system: S1 & S2 heard, RRR. No JVD, murmurs, rubs, gallops or clicks. No pedal edema. Gastrointestinal system: Abdomen is nondistended, soft and nontender. No organomegaly or masses felt. Normal bowel sounds heard. Central nervous system: Sleepy but arousable, following commands.  Oriented to time place and person.   Skin: No rashes, lesions or ulcers Right wrist: Erythematous, swollen and tender to touch.   Data Reviewed: I have personally reviewed following labs and imaging studies  CBC: Recent Labs  Lab 12/29/22 1051 12/30/22 0150 12/31/22 0450 01/01/23 0252 01/02/23 0056 01/03/23 1003 01/04/23 0430  WBC 12.5* 13.4* 14.8* 13.0* 13.7* 10.6* 8.5  NEUTROABS 9.7* 10.3* 12.0*  --   --   --   --   HGB 11.7* 11.2* 10.4* 10.7* 10.0* 10.1* 9.2*  HCT 36.6*  36.4* 33.6* 34.8* 31.9* 32.4* 30.0*  MCV 79.7* 80.7 80.8 80.9 82.6 83.5 83.6  PLT 112* 120* 158 172 212 264 286    Basic Metabolic Panel: Recent Labs  Lab 12/30/22 0150 12/31/22 0450 01/01/23 0252 01/02/23 0056 01/03/23 1003 01/04/23 0430  NA 138 140 137 138 139 140  K 3.3* 3.5 3.4* 4.2 2.7* 3.0*  CL 101 104 99 99 98 99  CO2 31 28 28 30  32 32  GLUCOSE 219* 236* 227* 134* 237* 146*  BUN 21 17 17 15 13 15   CREATININE 0.62 0.65 0.71 0.72 0.78 0.78  CALCIUM 7.4* 7.3* 7.4* 7.6* 7.6* 7.7*  MG 2.1 2.0 1.9  --  2.0 1.9    GFR: Estimated Creatinine Clearance: 94.3 mL/min (by C-G formula based on SCr of 0.78 mg/dL). Liver Function Tests: Recent Labs  Lab 12/29/22 1051 12/30/22 0150 12/31/22 0450 01/01/23 0252  AST 24 18 17 23   ALT 17 14 15 12   ALKPHOS 97 89 80 88  BILITOT 1.2 0.9 0.8 0.6  PROT 4.8* 5.0* 4.9* 5.2*  ALBUMIN <1.5* <1.5* <1.5* <1.5*    No results for input(s): "LIPASE", "AMYLASE" in the last 168 hours. No results for input(s): "AMMONIA" in the last 168 hours. Coagulation Profile: No results for input(s): "INR", "PROTIME" in the last 168 hours. Cardiac Enzymes:  No results for input(s): "CKTOTAL", "CKMB", "CKMBINDEX", "TROPONINI" in the last 168 hours. BNP (last 3 results) No results for input(s): "PROBNP" in the last 8760 hours. HbA1C: No results for input(s): "HGBA1C" in the last 72 hours. CBG: Recent Labs  Lab 01/03/23 1152 01/03/23 1520 01/03/23 2115 01/04/23 0600 01/04/23 1102  GLUCAP 277* 183* 143* 124* 145*    Lipid Profile: No results for input(s): "CHOL", "HDL", "LDLCALC", "TRIG", "CHOLHDL", "LDLDIRECT" in the last 72 hours. Thyroid Function Tests: No results for input(s): "TSH", "T4TOTAL", "FREET4", "T3FREE", "THYROIDAB" in the last 72 hours. Anemia Panel: Recent Labs    01/04/23 0850  TIBC 134*  IRON 9*   Sepsis Labs: No results for input(s): "PROCALCITON", "LATICACIDVEN" in the last 168 hours.  No results found for this or any  previous visit (from the past 240 hour(s)).     Radiology Studies: MR Lumbar Spine W Wo Contrast  Result Date: 01/03/2023 CLINICAL DATA:  Follow-up examination for infection. History of MSSA bacteremia EXAM: MRI LUMBAR SPINE WITHOUT AND WITH CONTRAST TECHNIQUE: Multiplanar and multiecho pulse sequences of the lumbar spine were obtained without and with intravenous contrast. CONTRAST:  9.47mL GADAVIST GADOBUTROL 1 MMOL/ML IV SOLN COMPARISON:  Comparison made with recent MRI from 12/25/2022. FINDINGS: Segmentation: Standard. Lowest well-formed disc space labeled the L5-S1 level. Alignment: Physiologic with preservation of the normal lumbar lordosis. No interval listhesis. Vertebrae: Heterogeneous precontrast T1 and T2 signal intensity, with mildly increased STIR signal intensity again seen within the L4 and L5 vertebral bodies. Marked lack of associated enhancement following contrast administration, similar to prior (series 10, image 8). Partial extension to involve the left pedicle at L5. Finding again consistent with osteonecrosis with infarction. Vertebral body height is maintained without interval collapse. Abnormal fluid signal intensity seen within the L4-5 interspace. Progressive surrounding paraspinous inflammatory changes within the bilateral psoas musculature, left greater than right (series 8, image 28). Few small loculated collections are no evident within both psoas muscles largest of which measures 1.4 cm on the right (series 11, image 28). Given this, there is presumed superimposed infection, likely related to discitis at the L4-5 interspace. Additionally, there is progressive inflammatory changes about the right L4-5 facet, consistent with acute septic arthritis. Few loculated collections within the adjacent right posterior paraspinous musculature measure up to 1.3 cm (series 11, image 26). Again seen is abnormal appearance of the ventral epidural space, extending from L2-3 through L5-S1 (series  5, image 9). Given the concomitant inflammatory changes about the paraspinous soft tissues, findings raise the concern for and epidural abscess. This extends from L2-3 through L5-S1, and measures approximately 0.9 x 2.0 x 8.4 cm in maximal dimensions (AP by transverse by craniocaudad). Additionally, there is progressive enhancement about the dorsal epidural space extending from L2-3 through L5-S1 without well-formed abscess or collection. No other evidence for new or distant infection elsewhere within the lumbar spine. Conus medullaris and cauda equina: Conus extends to the L1 level. Conus within normal limits. Nerve roots of the cauda equina within normal limits. Paraspinal and other soft tissues: Paraspinous inflammatory changes about the lower lumbar spine as above. 1.2 cm simple cyst present at the lower pole the left kidney, benign in appearance, no follow-up imaging recommended. Disc levels: L1-2: Disc desiccation with diffuse disc bulge. Mild to moderate left greater than right facet hypertrophy. No spinal stenosis. Foramina remain patent. L2-3: Diffuse disc bulge with endplate spurring. Moderate bilateral facet hypertrophy. Small epidural collection within the ventral epidural space. Resultant moderate spinal stenosis. Mild to  moderate bilateral L2 foraminal narrowing. L3-4: Disc desiccation with diffuse disc bulge. Mild facet hypertrophy. Ventral epidural collection. Mild to moderate spinal stenosis. Moderate right with mild-to-moderate left L3 foraminal narrowing. L4-5: Intervertebral disc space narrowing. Changes of osteonecrosis/infarct about the L4 and L5 vertebral bodies with superimposed findings concerning for discitis as above. Underlying disc bulge with endplate spurring. Mild facet hypertrophy. Ventral epidural collection. Associated moderate to severe canal with bilateral subarticular stenosis, similar to prior. Moderate right worse than left L4 foraminal narrowing. L5-S1: Diffuse disc bulge with  disc desiccation. Associated reactive endplate spurring. Disc bulging asymmetric to the left with associated left-sided annular fissure. No significant spinal stenosis. Mild bilateral L5 foraminal narrowing. IMPRESSION: 1. 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 2. Progressive superimposed changes concerning for infection with probable discitis at L4-5, with evidence for septic arthritis at the right L4-5 facet. Progressive associated paraspinous inflammatory changes, including multiple small loculated collections within the bilateral psoas muscles and right posterior paraspinous soft tissues as above. 3. Continued abnormal appearance of the ventral epidural space extending from L2-3 through L5-S1, mildly worsened from prior. Again, while this may in part be related to basivertebral venous thrombosis related to osteonecrosis, possible epidural abscess is difficult to exclude given the progressive inflammatory changes elsewhere within this region. 4. Underlying multilevel lumbar spondylosis with up to severe spinal stenosis at L4-5. Electronically Signed   By: Rise Mu M.D.   On: 01/03/2023 22:29    Scheduled Meds:  acetaminophen  500 mg Oral TID   amiodarone  400 mg Oral BID   Followed by   Melene Muller ON 01/08/2023] amiodarone  200 mg Oral Daily   azelastine  1 spray Each Nare BID   Chlorhexidine Gluconate Cloth  6 each Topical Daily   enoxaparin (LOVENOX) injection  90 mg Subcutaneous Q12H   escitalopram  10 mg Oral Daily   fluticasone  2 spray Each Nare Daily   furosemide  40 mg Intravenous BID   gabapentin  100 mg Oral BID   insulin aspart  0-15 Units Subcutaneous TID WC   insulin aspart  0-5 Units Subcutaneous QHS   insulin glargine-yfgn  15 Units Subcutaneous QHS   ketotifen  1 drop Left Eye BID   lidocaine  2 patch Transdermal Q24H   loratadine  10 mg Oral Daily   melatonin  3 mg Oral QHS   methocarbamol  500 mg Oral TID    pantoprazole  40 mg Oral BID   rosuvastatin  20 mg Oral Daily   sodium chloride flush  10-40 mL Intracatheter Q12H   sodium chloride flush  3 mL Intravenous Q12H   tamsulosin  0.4 mg Oral Daily   Continuous Infusions:  [START ON 01/11/2023]  ceFAZolin (ANCEF) IV     lactated ringers     nafcillin 12 g in sodium chloride 0.9 % 500 mL continuous infusion 12 g (01/04/23 0850)   potassium chloride 10 mEq (01/04/23 1312)     LOS: 11 days   Time spent: 35 minutes   Latorie Montesano Estill Cotta, MD [facility]  If 7PM-7AM, please contact night-coverage [email or web address] 01/04/2023, 1:27 PM

## 2023-01-04 NOTE — Progress Notes (Signed)
ID PROGRESS NOTE  72yo M with MSSA lumbar discitis, bacteremia with ventriculitis, though TEE negative. Currently on nafcillin, due to having ongoing fevers- he had repeat mri of spine on 6/7 which showed woresning process - with lumbar discitis, epidural abscess, septic facet. The read comments on extends from L2-3 through L5-S1, and measures approximately 0.9 x 2.0 x 8.4 cm in maximal dimensions (AP by transverse by craniocaudad). Additionally, there is progressive enhancement about the dorsal epidural space extending from L2-3 through L5-S1 without well-formed abscess or collection.  Agree with neurosurgery that this represents poor prognosis since we are having extension of infection while on iv abtx for mssa. The patient is not thought to be surgical candidate due to comorbidities and extent of infection.  For the time being, continue on nafcillin as drug of choice for MSSA disseminated infections. Will need to discuss with family and patient the poor prognosis.  Duke Salvia Drue Second MD MPH [facility] [phone]

## 2023-01-04 NOTE — Plan of Care (Signed)
  Problem: Education: Goal: Knowledge of General Education information will improve Description: Including pain rating scale, medication(s)/side effects and non-pharmacologic comfort measures Outcome: Progressing   Problem: Health Behavior/Discharge Planning: Goal: Ability to manage health-related needs will improve Outcome: Progressing   Problem: Clinical Measurements: Goal: Ability to maintain clinical measurements within normal limits will improve Outcome: Progressing Goal: Diagnostic test results will improve Outcome: Progressing Goal: Respiratory complications will improve Outcome: Progressing Goal: Cardiovascular complication will be avoided Outcome: Progressing   Problem: Activity: Goal: Risk for activity intolerance will decrease Outcome: Progressing   Problem: Coping: Goal: Level of anxiety will decrease Outcome: Progressing   Problem: Elimination: Goal: Will not experience complications related to bowel motility Outcome: Progressing Goal: Will not experience complications related to urinary retention Outcome: Progressing   Problem: Pain Managment: Goal: General experience of comfort will improve Outcome: Progressing   

## 2023-01-05 DIAGNOSIS — B9561 Methicillin susceptible Staphylococcus aureus infection as the cause of diseases classified elsewhere: Secondary | ICD-10-CM | POA: Diagnosis not present

## 2023-01-05 DIAGNOSIS — L02511 Cutaneous abscess of right hand: Secondary | ICD-10-CM | POA: Diagnosis not present

## 2023-01-05 DIAGNOSIS — M4646 Discitis, unspecified, lumbar region: Secondary | ICD-10-CM | POA: Diagnosis not present

## 2023-01-05 LAB — CBC
HCT: 30.6 % — ABNORMAL LOW (ref 39.0–52.0)
Hemoglobin: 9.4 g/dL — ABNORMAL LOW (ref 13.0–17.0)
MCH: 25.4 pg — ABNORMAL LOW (ref 26.0–34.0)
MCHC: 30.7 g/dL (ref 30.0–36.0)
MCV: 82.7 fL (ref 80.0–100.0)
Platelets: 331 10*3/uL (ref 150–400)
RBC: 3.7 MIL/uL — ABNORMAL LOW (ref 4.22–5.81)
RDW: 16.3 % — ABNORMAL HIGH (ref 11.5–15.5)
WBC: 7.6 10*3/uL (ref 4.0–10.5)
nRBC: 0 % (ref 0.0–0.2)

## 2023-01-05 LAB — BASIC METABOLIC PANEL
Anion gap: 8 (ref 5–15)
BUN: 13 mg/dL (ref 8–23)
CO2: 32 mmol/L (ref 22–32)
Calcium: 7.8 mg/dL — ABNORMAL LOW (ref 8.9–10.3)
Chloride: 99 mmol/L (ref 98–111)
Creatinine, Ser: 0.71 mg/dL (ref 0.61–1.24)
GFR, Estimated: >60 mL/min (ref >60)
Glucose, Bld: 126 mg/dL — ABNORMAL HIGH (ref 70–99)
Potassium: 2.8 mmol/L — ABNORMAL LOW (ref 3.5–5.1)
Sodium: 139 mmol/L (ref 135–145)

## 2023-01-05 LAB — GLUCOSE, CAPILLARY
Glucose-Capillary: 116 mg/dL — ABNORMAL HIGH (ref 70–99)
Glucose-Capillary: 197 mg/dL — ABNORMAL HIGH (ref 70–99)
Glucose-Capillary: 127 mg/dL — ABNORMAL HIGH (ref 70–99)
Glucose-Capillary: 139 mg/dL — ABNORMAL HIGH (ref 70–99)

## 2023-01-05 MED ORDER — IPRATROPIUM-ALBUTEROL 0.5-2.5 (3) MG/3ML IN SOLN: 3.0000 mL | Freq: Two times a day (BID) | RESPIRATORY_TRACT | Status: DC

## 2023-01-05 MED ORDER — METOPROLOL TARTRATE 5 MG/5ML IV SOLN: 5.0000 mg | INTRAVENOUS | Status: DC | PRN

## 2023-01-05 MED ORDER — POTASSIUM CHLORIDE CRYS ER 20 MEQ PO TBCR
40.0000 meq | EXTENDED_RELEASE_TABLET | Freq: Once | ORAL | Status: AC
  Administered 2023-01-05: 40 meq via ORAL
  Filled 2023-01-05: qty 2

## 2023-01-05 MED ORDER — IPRATROPIUM-ALBUTEROL 0.5-2.5 (3) MG/3ML IN SOLN
3.0000 mL | Freq: Four times a day (QID) | RESPIRATORY_TRACT | Status: DC
  Administered 2023-01-05 (×2): 3 mL via RESPIRATORY_TRACT
  Filled 2023-01-05 (×2): qty 3

## 2023-01-05 MED ORDER — AQUAPHOR EX OINT: TOPICAL_OINTMENT | Freq: Two times a day (BID) | CUTANEOUS | Status: DC | PRN

## 2023-01-05 MED ORDER — GUAIFENESIN 100 MG/5ML PO LIQD: 5.0000 mL | ORAL | Status: DC | PRN

## 2023-01-05 MED ORDER — TRAZODONE HCL 50 MG PO TABS
50.0000 mg | ORAL_TABLET | Freq: Every evening | ORAL | Status: DC | PRN
  Administered 2023-01-06: 50 mg via ORAL
  Filled 2023-01-05: qty 1

## 2023-01-05 MED ORDER — POTASSIUM CHLORIDE 10 MEQ/100ML IV SOLN
10.0000 meq | INTRAVENOUS | Status: AC
  Administered 2023-01-05 (×5): 10 meq via INTRAVENOUS
  Filled 2023-01-05 (×5): qty 100

## 2023-01-05 MED ORDER — HYDRALAZINE HCL 20 MG/ML IJ SOLN: 10.0000 mg | INTRAMUSCULAR | Status: DC | PRN

## 2023-01-05 NOTE — Progress Notes (Signed)
   01/04/23 2200  BiPAP/CPAP/SIPAP  BiPAP/CPAP/SIPAP Pt Type Adult  BiPAP/CPAP/SIPAP Resmed  Reason BIPAP/CPAP not in use Non-compliant;Other(comment) (Patient refused)

## 2023-01-05 NOTE — Progress Notes (Signed)
PROGRESS NOTE    Jim Ramirez  UJW:[id number] DOB: 15-Jul-1951 DOA: 12/24/2022 PCP: [name], MD   Brief Narrative:  72 y.o. male past medical history of hypertension and diabetes initially presented to [facility] on 12/22/2022 for chronic back pain radiating both her legs, with some shortness of breath that started 1 week prior to admission CT scan of the abdomen pelvis or lumbar spine discitis and osteomyelitis, transferred to [facility] found to be bacteremic with staph aureus with necrosis of the lumbar spine, also in A-fib with RVR, neurology, ID and cardiology were consulted. MRI of the wrist was done that showed fluid collection and surgery was consulted who recommended conservative management. Despite IV antibiotics he continues to spike fevers.  MRI lumbar spine done on 6/7 showed worsening of infection/concerns of epidural abscess but neurosurgery recommending against any surgical intervention. Initial blood cultures at [facility] grew MSSA. He was started on and, surveillance cultures remain negative.  TEE from 6/4 does not show any vegetation but MRI does show small fluid collection of the rest and surgery recommended conservative management.  Infectious disease recommended IV nafcillin from negative cultures on 5/28.   Assessment & Plan:  Principal Problem:   Discitis of lumbar region Active Problems:   Diabetes mellitus type 2 in nonobese (HCC)   Dyslipidemia   Essential hypertension   New onset atrial fibrillation (HCC)   Sepsis (HCC)   BPH (benign prostatic hyperplasia)   Mood disorder (HCC)   MSSA bacteremia    Severe sepsis secondary to persistent vertebral osteonecrosis/infarct of L4-L-L5 with discitis/septic arthritis and multiple loculated fluid collection around psoas muscle MSSA bacteremia -Initial blood cultures are growing MSSA and was started on IV vancomycin, eventually surveillance cultures remain negative and patient has been on IV nafcillin with ID  recommendations.  Total of 6 weeks from negative culture on 5/20. -MRI of the wrist also showed small fluid collection, home hand surgery recommended symptomatic management -MRI lumbar spine shows lumbar discitis, osteomyelitis and epidural abscess-neurosurgery at this point is recommending any surgical intervention as it will not change any prognosis especially patient does not improve on IV antibiotics.   left wrist edema/concern for tenosynovitis: -Conservative management per orthopedic. MRI shows persistent and worsening abscess of the right wrist, will rediscuss with [facility].  Abnormal breath sounds - Continue Lasix twice daily.  Aggressive use of bronchodilators and IV/flutter.  Out of bed to chair   Osteonecrosis of L4-L5: -Pain control.  Not a surgical candidate   New onset atrial fibrillation: -Currently on amiodarone, Lovenox every 12 hours.  Iron deficiency anemia:  Iron supplements with bowel regimen   Hypokalemia:  Replenished.  Check magnesium level   Acute kidney injury: Baseline creatinine 0.7, resolved   Acute thrombocytopenia: Resolved   Elevated LFTs and hyperbilirubinemia: Stable   Elevate troponin: -Likely demand ischemia.  Patient denies any chest pain.   BPH: -Continue Flomax.   Diabetes mellitus type 2 with hyperglycemia: -With an A1c of 8.1. Sliding scale and Accu-Chek.  Semglee 15 units at bedtime   Obstructive sleep apnea: -Continue CPAP at night.   Hyperlipidemia:  -continue statins.   Mood disorder: -Continue Lexapro.   Moderate protein caloric malnutrition: -Noted.  Patient has already been seen by palliative care.  Currently DNR but continue full treatment Overall very poor prognosis     DVT prophylaxis: Heparin Code Status: DNR Family Communication:  None present at bedside.  Plan of care discussed with patient in length and he verbalized understanding and  agreed with it.      Pressure Injury 01/03/23 Buttocks  Right;Left Stage 2 -  Partial thickness loss of dermis presenting as a shallow open injury with a red, pink wound bed without slough. 1x1 (Active)  01/03/23 2100  Location: Buttocks  Location Orientation: Right;Left  Staging: Stage 2 -  Partial thickness loss of dermis presenting as a shallow open injury with a red, pink wound bed without slough.  Wound Description (Comments): 1x1  Present on Admission: No     Diet Orders (From admission, onward)     Start     Ordered   01/03/23 1217  DIET DYS 3 Room service appropriate? No; Fluid consistency: Thin  Diet effective now       Question Answer Comment  Room service appropriate? No   Fluid consistency: Thin      01/03/23 1216            Subjective:  Still having pain and discomfort.  Examination:  General exam: Appears calm and comfortable, on 3 L nasal cannula Respiratory system: Bilateral rhonchi at the bases Cardiovascular system: S1 & S2 heard, RRR. No JVD, murmurs, rubs, gallops or clicks. No pedal edema. Gastrointestinal system: Abdomen is nondistended, soft and nontender. No organomegaly or masses felt. Normal bowel sounds heard. Central nervous system: Alert and oriented. No focal neurological deficits. Extremities: Symmetric 5 x 5 power. Skin: No rashes, lesions or ulcers Psychiatry: Judgement and insight appear normal. Mood & affect appropriate.  Objective: Vitals:   01/04/23 1920 01/05/23 0036 01/05/23 0346 01/05/23 0720  BP: (!) 140/64 (!) 119/57 (!) 134/57 (!) 134/59  Pulse: 83  76 77  Resp: 20 20 (!) 21 20  Temp: 98.5 F (36.9 C) 98.6 F (37 C) 97.6 F (36.4 C) 98 F (36.7 C)  TempSrc: Oral Oral Oral Oral  SpO2: 95% 95% 96% 97%  Weight:   86.5 kg   Height:        Intake/Output Summary (Last 24 hours) at 01/05/2023 0745 Last data filed at 01/04/2023 2200 Gross per 24 hour  Intake 446.02 ml  Output 2020 ml  Net -1573.98 ml   Filed Weights   12/31/22 1600 01/01/23 1500 01/05/23 0346  Weight: 93 kg  93 kg 86.5 kg    Scheduled Meds:  acetaminophen  500 mg Oral TID   amiodarone  400 mg Oral BID   Followed by   Melene Muller ON 01/08/2023] amiodarone  200 mg Oral Daily   azelastine  1 spray Each Nare BID   Chlorhexidine Gluconate Cloth  6 each Topical Daily   enoxaparin (LOVENOX) injection  90 mg Subcutaneous Q12H   escitalopram  10 mg Oral Daily   ferrous sulfate  325 mg Oral Q breakfast   fluticasone  2 spray Each Nare Daily   furosemide  40 mg Intravenous BID   gabapentin  100 mg Oral BID   insulin aspart  0-15 Units Subcutaneous TID WC   insulin aspart  0-5 Units Subcutaneous QHS   insulin glargine-yfgn  15 Units Subcutaneous QHS   ketotifen  1 drop Left Eye BID   lidocaine  2 patch Transdermal Q24H   loratadine  10 mg Oral Daily   melatonin  3 mg Oral QHS   methocarbamol  500 mg Oral TID   pantoprazole  40 mg Oral BID   rosuvastatin  20 mg Oral Daily   sodium chloride flush  10-40 mL Intracatheter Q12H   sodium chloride flush  3 mL Intravenous Q12H  tamsulosin  0.4 mg Oral Daily   Continuous Infusions:  [START ON 01/11/2023]  ceFAZolin (ANCEF) IV     lactated ringers     nafcillin 12 g in sodium chloride 0.9 % 500 mL continuous infusion Stopped (01/04/23 1654)    Nutritional status     Body mass index is 25.86 kg/m.  Data Reviewed:   CBC: Recent Labs  Lab 12/29/22 1051 12/30/22 0150 12/31/22 0450 01/01/23 0252 01/02/23 0056 01/03/23 1003 01/04/23 0430 01/05/23 0515  WBC 12.5* 13.4* 14.8* 13.0* 13.7* 10.6* 8.5 7.6  NEUTROABS 9.7* 10.3* 12.0*  --   --   --   --   --   HGB 11.7* 11.2* 10.4* 10.7* 10.0* 10.1* 9.2* 9.4*  HCT 36.6* 36.4* 33.6* 34.8* 31.9* 32.4* 30.0* 30.6*  MCV 79.7* 80.7 80.8 80.9 82.6 83.5 83.6 82.7  PLT 112* 120* 158 172 212 264 286 331   Basic Metabolic Panel: Recent Labs  Lab 12/30/22 0150 12/31/22 0450 01/01/23 0252 01/02/23 0056 01/03/23 1003 01/04/23 0430 01/05/23 0515  NA 138 140 137 138 139 140 139  K 3.3* 3.5 3.4* 4.2 2.7*  3.0* 2.8*  CL 101 104 99 99 98 99 99  CO2 31 28 28 30  32 32 32  GLUCOSE 219* 236* 227* 134* 237* 146* 126*  BUN 21 17 17 15 13 15 13   CREATININE 0.62 0.65 0.71 0.72 0.78 0.78 0.71  CALCIUM 7.4* 7.3* 7.4* 7.6* 7.6* 7.7* 7.8*  MG 2.1 2.0 1.9  --  2.0 1.9  --    GFR: Estimated Creatinine Clearance: 94.3 mL/min (by C-G formula based on SCr of 0.71 mg/dL). Liver Function Tests: Recent Labs  Lab 12/29/22 1051 12/30/22 0150 12/31/22 0450 01/01/23 0252  AST 24 18 17 23   ALT 17 14 15 12   ALKPHOS 97 89 80 88  BILITOT 1.2 0.9 0.8 0.6  PROT 4.8* 5.0* 4.9* 5.2*  ALBUMIN <1.5* <1.5* <1.5* <1.5*   No results for input(s): "LIPASE", "AMYLASE" in the last 168 hours. No results for input(s): "AMMONIA" in the last 168 hours. Coagulation Profile: No results for input(s): "INR", "PROTIME" in the last 168 hours. Cardiac Enzymes: No results for input(s): "CKTOTAL", "CKMB", "CKMBINDEX", "TROPONINI" in the last 168 hours. BNP (last 3 results) No results for input(s): "PROBNP" in the last 8760 hours. HbA1C: No results for input(s): "HGBA1C" in the last 72 hours. CBG: Recent Labs  Lab 01/04/23 0600 01/04/23 1102 01/04/23 1558 01/04/23 2035 01/05/23 0600  GLUCAP 124* 145* 120* 104* 116*   Lipid Profile: No results for input(s): "CHOL", "HDL", "LDLCALC", "TRIG", "CHOLHDL", "LDLDIRECT" in the last 72 hours. Thyroid Function Tests: No results for input(s): "TSH", "T4TOTAL", "FREET4", "T3FREE", "THYROIDAB" in the last 72 hours. Anemia Panel: Recent Labs    01/04/23 0850  TIBC 134*  IRON 9*   Sepsis Labs: No results for input(s): "PROCALCITON", "LATICACIDVEN" in the last 168 hours.  No results found for this or any previous visit (from the past 240 hour(s)).       Radiology Studies: MR Lumbar Spine W Wo Contrast  Result Date: 01/03/2023 CLINICAL DATA:  Follow-up examination for infection. History of MSSA bacteremia EXAM: MRI LUMBAR SPINE WITHOUT AND WITH CONTRAST TECHNIQUE:  Multiplanar and multiecho pulse sequences of the lumbar spine were obtained without and with intravenous contrast. CONTRAST:  9.14mL GADAVIST GADOBUTROL 1 MMOL/ML IV SOLN COMPARISON:  Comparison made with recent MRI from 12/25/2022. FINDINGS: Segmentation: Standard. Lowest well-formed disc space labeled the L5-S1 level. Alignment: Physiologic with preservation of the  normal lumbar lordosis. No interval listhesis. Vertebrae: Heterogeneous precontrast T1 and T2 signal intensity, with mildly increased STIR signal intensity again seen within the L4 and L5 vertebral bodies. Marked lack of associated enhancement following contrast administration, similar to prior (series 10, image 8). Partial extension to involve the left pedicle at L5. Finding again consistent with osteonecrosis with infarction. Vertebral body height is maintained without interval collapse. Abnormal fluid signal intensity seen within the L4-5 interspace. Progressive surrounding paraspinous inflammatory changes within the bilateral psoas musculature, left greater than right (series 8, image 28). Few small loculated collections are no evident within both psoas muscles largest of which measures 1.4 cm on the right (series 11, image 28). Given this, there is presumed superimposed infection, likely related to discitis at the L4-5 interspace. Additionally, there is progressive inflammatory changes about the right L4-5 facet, consistent with acute septic arthritis. Few loculated collections within the adjacent right posterior paraspinous musculature measure up to 1.3 cm (series 11, image 26). Again seen is abnormal appearance of the ventral epidural space, extending from L2-3 through L5-S1 (series 5, image 9). Given the concomitant inflammatory changes about the paraspinous soft tissues, findings raise the concern for and epidural abscess. This extends from L2-3 through L5-S1, and measures approximately 0.9 x 2.0 x 8.4 cm in maximal dimensions (AP by transverse  by craniocaudad). Additionally, there is progressive enhancement about the dorsal epidural space extending from L2-3 through L5-S1 without well-formed abscess or collection. No other evidence for new or distant infection elsewhere within the lumbar spine. Conus medullaris and cauda equina: Conus extends to the L1 level. Conus within normal limits. Nerve roots of the cauda equina within normal limits. Paraspinal and other soft tissues: Paraspinous inflammatory changes about the lower lumbar spine as above. 1.2 cm simple cyst present at the lower pole the left kidney, benign in appearance, no follow-up imaging recommended. Disc levels: L1-2: Disc desiccation with diffuse disc bulge. Mild to moderate left greater than right facet hypertrophy. No spinal stenosis. Foramina remain patent. L2-3: Diffuse disc bulge with endplate spurring. Moderate bilateral facet hypertrophy. Small epidural collection within the ventral epidural space. Resultant moderate spinal stenosis. Mild to moderate bilateral L2 foraminal narrowing. L3-4: Disc desiccation with diffuse disc bulge. Mild facet hypertrophy. Ventral epidural collection. Mild to moderate spinal stenosis. Moderate right with mild-to-moderate left L3 foraminal narrowing. L4-5: Intervertebral disc space narrowing. Changes of osteonecrosis/infarct about the L4 and L5 vertebral bodies with superimposed findings concerning for discitis as above. Underlying disc bulge with endplate spurring. Mild facet hypertrophy. Ventral epidural collection. Associated moderate to severe canal with bilateral subarticular stenosis, similar to prior. Moderate right worse than left L4 foraminal narrowing. L5-S1: Diffuse disc bulge with disc desiccation. Associated reactive endplate spurring. Disc bulging asymmetric to the left with associated left-sided annular fissure. No significant spinal stenosis. Mild bilateral L5 foraminal narrowing. IMPRESSION: 1. Persistent findings of vertebral  osteonecrosis/infarct involving the L4 and L5 vertebral bodies. 2. Progressive superimposed changes concerning for infection with probable discitis at L4-5, with evidence for septic arthritis at the right L4-5 facet. Progressive associated paraspinous inflammatory changes, including multiple small loculated collections within the bilateral psoas muscles and right posterior paraspinous soft tissues as above. 3. Continued abnormal appearance of the ventral epidural space extending from L2-3 through L5-S1, mildly worsened from prior. Again, while this may in part be related to basivertebral venous thrombosis related to osteonecrosis, possible epidural abscess is difficult to exclude given the progressive inflammatory changes elsewhere within this region. 4. Underlying multilevel lumbar  spondylosis with up to severe spinal stenosis at L4-5. Electronically Signed   By: Rise Mu M.D.   On: 01/03/2023 22:29           LOS: 12 days   Time spent= 35 mins    Nehal Shives Joline Maxcy, MD [facility]  If 7PM-7AM, please contact night-coverage  01/05/2023, 7:45 AM

## 2023-01-05 NOTE — Progress Notes (Signed)
[facility]    Date of Admission:  12/24/2022     ID: Jim Ramirez is a 71 y.o. male with  disseminated MSSA infection Principal Problem:   Discitis of lumbar region Active Problems:   Diabetes mellitus type 2 in nonobese [facility])   Dyslipidemia   Essential hypertension   New onset atrial fibrillation (HCC)   Sepsis (HCC)   BPH (benign prostatic hyperplasia)   Mood disorder (HCC)   MSSA bacteremia    Subjective: Afebrile. But did undergo repeat mri of right hand showing extention on infection.  Medications:   acetaminophen  500 mg Oral TID   amiodarone  400 mg Oral BID   Followed by   Melene Muller ON 01/08/2023] amiodarone  200 mg Oral Daily   azelastine  1 spray Each Nare BID   Chlorhexidine Gluconate Cloth  6 each Topical Daily   enoxaparin (LOVENOX) injection  90 mg Subcutaneous Q12H   escitalopram  10 mg Oral Daily   ferrous sulfate  325 mg Oral Q breakfast   fluticasone  2 spray Each Nare Daily   furosemide  40 mg Intravenous BID   gabapentin  100 mg Oral BID   insulin aspart  0-15 Units Subcutaneous TID WC   insulin aspart  0-5 Units Subcutaneous QHS   insulin glargine-yfgn  15 Units Subcutaneous QHS   ipratropium-albuterol  3 mL Nebulization Q6H   ketotifen  1 drop Left Eye BID   lidocaine  2 patch Transdermal Q24H   loratadine  10 mg Oral Daily   melatonin  3 mg Oral QHS   methocarbamol  500 mg Oral TID   pantoprazole  40 mg Oral BID   rosuvastatin  20 mg Oral Daily   sodium chloride flush  10-40 mL Intracatheter Q12H   sodium chloride flush  3 mL Intravenous Q12H   tamsulosin  0.4 mg Oral Daily    Objective: Vital signs in last 24 hours: Temp:  [97.6 F (36.4 C)-98.6 F (37 C)] 98.5 F (36.9 C) (06/09 1112) Pulse Rate:  [69-83] 69 (06/09 1112) Resp:  [20-22] 22 (06/09 1112) BP: (110-140)/(55-64) 110/55 (06/09 1112) SpO2:  [95 %-98 %] 98 % (06/09 1112) Weight:  [86.5 kg] 86.5 kg (06/09 0346)  Physical Exam   Constitutional: He is oriented to person, place, and time. He appears well-developed and well-nourished. No distress.  HENT:  Mouth/Throat: Oropharynx is clear and moist. No oropharyngeal exudate.  Cardiovascular: Normal rate, regular rhythm and normal heart sounds. Exam reveals no gallop and no friction rub.  No murmur heard.  Pulmonary/Chest: Effort normal and breath sounds normal. No respiratory distress. He has no wheezes.  Abdominal: Soft. Bowel sounds are normal. He exhibits no distension. There is no tenderness.  ZOX:WRUEA hand swelling Neurological: He is alert and oriented to person, place, and time.  Skin: Skin is warm and dry. No rash noted. No erythema.  Psychiatric: He has a normal mood and affect. His behavior is normal.    Lab Results Recent Labs    01/04/23 0430 01/05/23 0515  WBC 8.5 7.6  HGB 9.2* 9.4*  HCT 30.0* 30.6*  NA 140 139  K 3.0* 2.8*  CL 99 99  CO2 32 32  BUN 15 13  CREATININE 0.78 0.71   Lab Results  Component Value Date   ESRSEDRATE 46 (H) 12/24/2022     Microbiology: reviewed Studies/Results: MR WRIST RIGHT W WO CONTRAST  Result Date: 01/05/2023 CLINICAL DATA:  Soft tissue infection  EXAM: MR OF THE RIGHT WRIST WITHOUT AND WITH CONTRAST TECHNIQUE: Multiplanar multisequence MR imaging of the right wrist was performed both before and after the administration of intravenous contrast. The exam was centered up in the central hand region more than the wrist region, as this is the site of suspected infection based on prior imaging. CONTRAST:  10mL GADAVIST GADOBUTROL 1 MMOL/ML IV SOLN COMPARISON:  12/27/2022 FINDINGS: Despite efforts by the technologist and patient, severe motion artifact is present on today's exam and could not be eliminated. This reduces exam sensitivity and specificity. Ligaments: Torn scapholunate ligament. Triangular fibrocartilage: Not characterized in an ideal fashion, no obvious defect. Tendons: Tenosynovitis is again observed in  the second extensor compartment. Carpal tunnel/median nerve: Low-grade edema tracks in the carpal tunnel without expansion of the median nerve identified. Guyon's canal: No obvious or large impinging lesions although motion artifact contributes to suboptimal assessment. Joint/cartilage: Moderate effusion the radiocarpal joint and along the midcarpal row dorsally. Synovial enhancement in this region noted. Bones/carpal alignment: Adjacent abnormal edema signal in the scaphoid and lunate, probably degenerative. Small geodes along the triquetrum, hamate, and trapezoid. Further distally in the hand, there is extensive edema, enhancement, and heterogeneity in the head and distal metaphysis of the index finger metacarpal, and in the proximal phalanx of the index finger, compatible with osteomyelitis and raising suspicion for a septic index finger MCP joint. Imaging does not include the middle or distal phalanges. Small foci subcortical edema signal along the middle finger and ring finger MCP joints, likely degenerative. Other: Connecting to the second digit MCP joint, a volar abscess measuring about 3.4 by 1.0 by 3.2 cm (volume = 5.7 cm^3) is observed as shown for example on image 9 of series 17. This is deep to the flexor tendons and primarily along the abductor pollicis and adjacent interosseous muscles. Upper there is also a smaller dorsal abscess extending proximally from the region of the joint but deep to the extensor tendons, measuring about 2.6 by 0.7 by 2.3 cm (volume = 2 cm^3). There is substantial edema and enhancement tracking in the dorsal subcutaneous tissues the hand likely reflecting cellulitis. There is also edema and low-grade enhancement tracking on fascia planes throughout the visualized portion of the hand suspicious for local inflammation and possibly fasciitis. No obvious gas in the soft tissues noted. IMPRESSION: 1. 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 There are 2 adjacent abscesses extending proximally from the joint, the larger palmar to the joint and extending along the abductor pollicis muscle, and the smaller abscess dorsal to the index finger metacarpal. 2. Dorsal cellulitis in the hand. 3. Edema and low-grade enhancement tracking on fascia planes throughout the visualized portion of the hand suspicious for local inflammation and possibly fasciitis. 4. Torn scapholunate ligament. 5. Moderate effusion of the radiocarpal joint and along the midcarpal row dorsally. 6. Degenerative findings in the carpus and along the middle and ring finger MCP joints. 7. Despite efforts by the technologist and patient, severe motion artifact is present on today's exam and could not be eliminated. This adversely affects exam sensitivity and specificity. Electronically Signed   By: Gaylyn Rong M.D.   On: 01/05/2023 10:27   MR Lumbar Spine W Wo Contrast  Result Date: 01/03/2023 CLINICAL DATA:  Follow-up examination for infection. History of MSSA bacteremia EXAM: MRI LUMBAR SPINE WITHOUT AND WITH CONTRAST TECHNIQUE: Multiplanar and multiecho pulse sequences of the lumbar spine were obtained without and with intravenous  contrast. CONTRAST:  9.69mL GADAVIST GADOBUTROL 1 MMOL/ML IV SOLN COMPARISON:  Comparison made with recent MRI from 12/25/2022. FINDINGS: Segmentation: Standard. Lowest well-formed disc space labeled the L5-S1 level. Alignment: Physiologic with preservation of the normal lumbar lordosis. No interval listhesis. Vertebrae: Heterogeneous precontrast T1 and T2 signal intensity, with mildly increased STIR signal intensity again seen within the L4 and L5 vertebral bodies. Marked lack of associated enhancement following contrast administration, similar to prior (series 10, image 8). Partial extension to involve the left pedicle at L5. Finding again consistent with osteonecrosis with infarction. Vertebral body height is maintained without  interval collapse. Abnormal fluid signal intensity seen within the L4-5 interspace. Progressive surrounding paraspinous inflammatory changes within the bilateral psoas musculature, left greater than right (series 8, image 28). Few small loculated collections are no evident within both psoas muscles largest of which measures 1.4 cm on the right (series 11, image 28). Given this, there is presumed superimposed infection, likely related to discitis at the L4-5 interspace. Additionally, there is progressive inflammatory changes about the right L4-5 facet, consistent with acute septic arthritis. Few loculated collections within the adjacent right posterior paraspinous musculature measure up to 1.3 cm (series 11, image 26). Again seen is abnormal appearance of the ventral epidural space, extending from L2-3 through L5-S1 (series 5, image 9). Given the concomitant inflammatory changes about the paraspinous soft tissues, findings raise the concern for and epidural abscess. This extends from L2-3 through L5-S1, and measures approximately 0.9 x 2.0 x 8.4 cm in maximal dimensions (AP by transverse by craniocaudad). Additionally, there is progressive enhancement about the dorsal epidural space extending from L2-3 through L5-S1 without well-formed abscess or collection. No other evidence for new or distant infection elsewhere within the lumbar spine. Conus medullaris and cauda equina: Conus extends to the L1 level. Conus within normal limits. Nerve roots of the cauda equina within normal limits. Paraspinal and other soft tissues: Paraspinous inflammatory changes about the lower lumbar spine as above. 1.2 cm simple cyst present at the lower pole the left kidney, benign in appearance, no follow-up imaging recommended. Disc levels: L1-2: Disc desiccation with diffuse disc bulge. Mild to moderate left greater than right facet hypertrophy. No spinal stenosis. Foramina remain patent. L2-3: Diffuse disc bulge with endplate spurring.  Moderate bilateral facet hypertrophy. Small epidural collection within the ventral epidural space. Resultant moderate spinal stenosis. Mild to moderate bilateral L2 foraminal narrowing. L3-4: Disc desiccation with diffuse disc bulge. Mild facet hypertrophy. Ventral epidural collection. Mild to moderate spinal stenosis. Moderate right with mild-to-moderate left L3 foraminal narrowing. L4-5: Intervertebral disc space narrowing. Changes of osteonecrosis/infarct about the L4 and L5 vertebral bodies with superimposed findings concerning for discitis as above. Underlying disc bulge with endplate spurring. Mild facet hypertrophy. Ventral epidural collection. Associated moderate to severe canal with bilateral subarticular stenosis, similar to prior. Moderate right worse than left L4 foraminal narrowing. L5-S1: Diffuse disc bulge with disc desiccation. Associated reactive endplate spurring. Disc bulging asymmetric to the left with associated left-sided annular fissure. No significant spinal stenosis. Mild bilateral L5 foraminal narrowing. IMPRESSION: 1. 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 2. Progressive superimposed changes concerning for infection with probable discitis at L4-5, with evidence for septic arthritis at the right L4-5 facet. Progressive associated paraspinous inflammatory changes, including multiple small loculated collections within the bilateral psoas muscles and right posterior paraspinous soft tissues as above. 3. Continued abnormal appearance of the ventral epidural space extending from L2-3 through L5-S1, mildly worsened from  prior. Again, while this may in part be related to basivertebral venous thrombosis related to osteonecrosis, possible epidural abscess is difficult to exclude given the progressive inflammatory changes elsewhere within this region. 4. Underlying multilevel lumbar spondylosis with up to severe spinal stenosis at L4-5.  Electronically Signed   By: Rise Mu M.D.   On: 01/03/2023 22:29     Assessment/Plan: MSSA disseminated infection with bacteremia, epidural abscess/spinal osteomyelitis, and also right hand abscess/right 2nd digit osteomyelitis =  Continue on nafcillin Recommend to have hand surgery evaluate patient again given repeat imaging for washout  Deconditioning = encouraged patient to sit in chair today and do some of OT/Pt exercises  [facility] Pager: [phone]  01/05/2023, 11:46 AM

## 2023-01-05 NOTE — Progress Notes (Signed)
I reviewed the right hand mri and spoke with medicine attending. Plan for npo at midnight and family discussion tomorrow morning regarding whether patient is a surgical candidate.   Philipp Ovens, MD

## 2023-01-05 NOTE — Progress Notes (Signed)
   01/05/23 2200  BiPAP/CPAP/SIPAP  BiPAP/CPAP/SIPAP Pt Type Adult  BiPAP/CPAP/SIPAP Resmed  Reason BIPAP/CPAP not in use Non-compliant (refused.)  Patient Home Equipment No

## 2023-01-06 DIAGNOSIS — I4891 Unspecified atrial fibrillation: Secondary | ICD-10-CM | POA: Diagnosis not present

## 2023-01-06 DIAGNOSIS — M8618 Other acute osteomyelitis, other site: Secondary | ICD-10-CM

## 2023-01-06 DIAGNOSIS — E785 Hyperlipidemia, unspecified: Secondary | ICD-10-CM | POA: Diagnosis not present

## 2023-01-06 DIAGNOSIS — R7881 Bacteremia: Secondary | ICD-10-CM | POA: Diagnosis not present

## 2023-01-06 DIAGNOSIS — Z7189 Other specified counseling: Secondary | ICD-10-CM | POA: Diagnosis not present

## 2023-01-06 DIAGNOSIS — E1169 Type 2 diabetes mellitus with other specified complication: Secondary | ICD-10-CM | POA: Diagnosis not present

## 2023-01-06 DIAGNOSIS — L02511 Cutaneous abscess of right hand: Secondary | ICD-10-CM

## 2023-01-06 DIAGNOSIS — Z515 Encounter for palliative care: Secondary | ICD-10-CM | POA: Diagnosis not present

## 2023-01-06 DIAGNOSIS — M4646 Discitis, unspecified, lumbar region: Secondary | ICD-10-CM | POA: Diagnosis not present

## 2023-01-06 LAB — CBC
HCT: 31.4 % — ABNORMAL LOW (ref 39.0–52.0)
Hemoglobin: 9.7 g/dL — ABNORMAL LOW (ref 13.0–17.0)
MCH: 25.9 pg — ABNORMAL LOW (ref 26.0–34.0)
MCHC: 30.9 g/dL (ref 30.0–36.0)
MCV: 83.7 fL (ref 80.0–100.0)
Platelets: 325 10*3/uL (ref 150–400)
RBC: 3.75 MIL/uL — ABNORMAL LOW (ref 4.22–5.81)
RDW: 16.4 % — ABNORMAL HIGH (ref 11.5–15.5)
WBC: 9.4 10*3/uL (ref 4.0–10.5)
nRBC: 0 % (ref 0.0–0.2)

## 2023-01-06 LAB — BASIC METABOLIC PANEL
Anion gap: 10 (ref 5–15)
BUN: 12 mg/dL (ref 8–23)
CO2: 31 mmol/L (ref 22–32)
Calcium: 7.9 mg/dL — ABNORMAL LOW (ref 8.9–10.3)
Chloride: 95 mmol/L — ABNORMAL LOW (ref 98–111)
Creatinine, Ser: 0.77 mg/dL (ref 0.61–1.24)
GFR, Estimated: >60 mL/min (ref >60)
Glucose, Bld: 164 mg/dL — ABNORMAL HIGH (ref 70–99)
Potassium: 3 mmol/L — ABNORMAL LOW (ref 3.5–5.1)
Sodium: 136 mmol/L (ref 135–145)

## 2023-01-06 LAB — GLUCOSE, CAPILLARY
Glucose-Capillary: 138 mg/dL — ABNORMAL HIGH (ref 70–99)
Glucose-Capillary: 123 mg/dL — ABNORMAL HIGH (ref 70–99)
Glucose-Capillary: 130 mg/dL — ABNORMAL HIGH (ref 70–99)
Glucose-Capillary: 105 mg/dL — ABNORMAL HIGH (ref 70–99)
Glucose-Capillary: 84 mg/dL (ref 70–99)

## 2023-01-06 LAB — MAGNESIUM: Magnesium: 1.7 mg/dL (ref 1.7–2.4)

## 2023-01-06 LAB — BRAIN NATRIURETIC PEPTIDE: B Natriuretic Peptide: 51 pg/mL (ref 0.0–100.0)

## 2023-01-06 MED ORDER — RISPERIDONE 0.5 MG PO TABS
0.5000 mg | ORAL_TABLET | Freq: Every day | ORAL | Status: DC
  Administered 2023-01-06 – 2023-01-27 (×22): 0.5 mg via ORAL
  Filled 2023-01-06 (×24): qty 1

## 2023-01-06 MED ORDER — RISPERIDONE 0.5 MG PO TABS: 0.5000 mg | ORAL_TABLET | Freq: Every day | ORAL | Status: DC

## 2023-01-06 MED ORDER — POTASSIUM CHLORIDE CRYS ER 20 MEQ PO TBCR
40.0000 meq | EXTENDED_RELEASE_TABLET | ORAL | Status: AC
  Administered 2023-01-06 (×3): 40 meq via ORAL
  Filled 2023-01-06 (×3): qty 2

## 2023-01-06 NOTE — Progress Notes (Signed)
Subjective:  I am worried about my back   Antibiotics:  Anti-infectives (From admission, onward)    Start     Dose/Rate Route Frequency Ordered Stop   01/11/23 0600  ceFAZolin (ANCEF) IVPB 2g/100 mL premix        2 g 200 mL/hr over 30 Minutes Intravenous Every 8 hours 12/31/22 1449     12/28/22 1015  nafcillin 12 g in sodium chloride 0.9 % 500 mL continuous infusion        12 g 20.8 mL/hr over 24 Hours Intravenous Every 24 hours 12/28/22 0925 01/11/23 0959   12/28/22 0830  nafcillin 2 g in sodium chloride 0.9 % 100 mL IVPB  Status:  Discontinued        2 g 216 mL/hr over 30 Minutes Intravenous Every 4 hours 12/28/22 0736 12/28/22 0925   12/26/22 1400  ceFAZolin (ANCEF) IVPB 2g/100 mL premix  Status:  Discontinued        2 g 200 mL/hr over 30 Minutes Intravenous Every 8 hours 12/26/22 0823 12/28/22 0736   12/25/22 2200  vancomycin (VANCOREADY) IVPB 1750 mg/350 mL  Status:  Discontinued        1,750 mg 175 mL/hr over 120 Minutes Intravenous Every 24 hours 12/25/22 0919 12/26/22 0823   12/25/22 1200  cefTRIAXone (ROCEPHIN) 2 g in sodium chloride 0.9 % 100 mL IVPB  Status:  Discontinued        2 g 200 mL/hr over 30 Minutes Intravenous Every 24 hours 12/25/22 0915 12/25/22 0935   12/24/22 2000  ceFEPIme (MAXIPIME) 2 g in sodium chloride 0.9 % 100 mL IVPB  Status:  Discontinued        2 g 200 mL/hr over 30 Minutes Intravenous Every 8 hours 12/24/22 1856 12/25/22 0837   12/24/22 2000  vancomycin (VANCOREADY) IVPB 1750 mg/350 mL  Status:  Discontinued        1,750 mg 175 mL/hr over 120 Minutes Intravenous Every 24 hours 12/24/22 1857 12/25/22 0837   12/24/22 1930  metroNIDAZOLE (FLAGYL) IVPB 500 mg  Status:  Discontinued        500 mg 100 mL/hr over 60 Minutes Intravenous Every 12 hours 12/24/22 1830 12/25/22 0837       Medications: Scheduled Meds:  acetaminophen  500 mg Oral TID   amiodarone  400 mg Oral BID   Followed by   Melene Muller ON 01/08/2023] amiodarone  200 mg  Oral Daily   azelastine  1 spray Each Nare BID   Chlorhexidine Gluconate Cloth  6 each Topical Daily   enoxaparin (LOVENOX) injection  90 mg Subcutaneous Q12H   escitalopram  10 mg Oral Daily   ferrous sulfate  325 mg Oral Q breakfast   fluticasone  2 spray Each Nare Daily   furosemide  40 mg Intravenous BID   gabapentin  100 mg Oral BID   insulin aspart  0-15 Units Subcutaneous TID WC   insulin aspart  0-5 Units Subcutaneous QHS   insulin glargine-yfgn  15 Units Subcutaneous QHS   ketotifen  1 drop Left Eye BID   lidocaine  2 patch Transdermal Q24H   loratadine  10 mg Oral Daily   melatonin  3 mg Oral QHS   methocarbamol  500 mg Oral TID   pantoprazole  40 mg Oral BID   potassium chloride  40 mEq Oral Q4H   rosuvastatin  20 mg Oral Daily   sodium chloride flush  10-40 mL Intracatheter Q12H  sodium chloride flush  3 mL Intravenous Q12H   tamsulosin  0.4 mg Oral Daily   Continuous Infusions:  [START ON 01/11/2023]  ceFAZolin (ANCEF) IV     lactated ringers     nafcillin 12 g in sodium chloride 0.9 % 500 mL continuous infusion 12 g (01/06/23 0952)   PRN Meds:.acetaminophen **OR** acetaminophen, guaiFENesin, hydrALAZINE, levalbuterol, metoprolol tartrate, mineral oil-hydrophilic petrolatum, morphine injection, ondansetron **OR** ondansetron (ZOFRAN) IV, oxyCODONE, senna, sodium chloride flush, traZODone, zolpidem    Objective: Weight change:   Intake/Output Summary (Last 24 hours) at 01/06/2023 1029 Last data filed at 01/05/2023 1917 Gross per 24 hour  Intake 219.58 ml  Output 2300 ml  Net -2080.42 ml   Blood pressure 118/62, pulse 82, temperature 99.4 F (37.4 C), temperature source Oral, resp. rate 17, height 6' (1.829 m), weight 86.5 kg, SpO2 98 %. Temp:  [97.9 F (36.6 C)-99.4 F (37.4 C)] 99.4 F (37.4 C) (06/10 0751) Pulse Rate:  [69-86] 82 (06/10 0751) Resp:  [17-22] 17 (06/10 0751) BP: (110-135)/(44-91) 118/62 (06/10 0751) SpO2:  [90 %-98 %] 98 % (06/10  0235)  Physical Exam: Physical Exam Constitutional:      Appearance: He is well-developed.  HENT:     Head: Normocephalic and atraumatic.  Eyes:     Conjunctiva/sclera: Conjunctivae normal.  Cardiovascular:     Rate and Rhythm: Normal rate and regular rhythm.  Pulmonary:     Effort: Pulmonary effort is normal. No respiratory distress.     Breath sounds: Normal breath sounds. No wheezing.  Abdominal:     General: There is no distension.     Palpations: Abdomen is soft.  Musculoskeletal:     Right hand: Swelling, deformity and tenderness present. Decreased range of motion.     Cervical back: Normal range of motion and neck supple.  Skin:    General: Skin is warm and dry.     Findings: No erythema or rash.  Neurological:     General: No focal deficit present.     Mental Status: He is alert and oriented to person, place, and time.  Psychiatric:        Mood and Affect: Mood normal.        Behavior: Behavior normal.        Thought Content: Thought content normal.        Judgment: Judgment normal.     He seems to be weaker in left lower extremity  CBC:    BMET Recent Labs    01/05/23 0515 01/06/23 0424  NA 139 136  K 2.8* 3.0*  CL 99 95*  CO2 32 31  GLUCOSE 126* 164*  BUN 13 12  CREATININE 0.71 0.77  CALCIUM 7.8* 7.9*     Liver Panel  No results for input(s): "PROT", "ALBUMIN", "AST", "ALT", "ALKPHOS", "BILITOT", "BILIDIR", "IBILI" in the last 72 hours.     Sedimentation Rate No results for input(s): "ESRSEDRATE" in the last 72 hours. C-Reactive Protein No results for input(s): "CRP" in the last 72 hours.  Micro Results: Recent Results (from the past 720 hour(s))  MRSA Next Gen by PCR, Nasal     Status: None   Collection Time: 12/24/22  4:40 PM   Specimen: Nasal Mucosa; Nasal Swab  Result Value Ref Range Status   MRSA by PCR Next Gen NOT DETECTED NOT DETECTED Final    Comment: (NOTE) The GeneXpert MRSA Assay (FDA approved for NASAL specimens  only), is one component of a comprehensive MRSA colonization surveillance program.  It is not intended to diagnose MRSA infection nor to guide or monitor treatment for MRSA infections. Test performance is not FDA approved in patients less than 63 years old. Performed at [facility], [address] [place], [place] [address]   Culture, blood (Routine X 2) w Reflex to ID Panel     Status: None   Collection Time: 12/24/22  4:46 PM   Specimen: BLOOD LEFT HAND  Result Value Ref Range Status   Specimen Description BLOOD LEFT HAND  Final   Special Requests   Final    BOTTLES DRAWN AEROBIC AND ANAEROBIC Blood Culture adequate volume   Culture   Final    NO GROWTH 5 DAYS Performed at [facility], [address] [place], [place] [address]    Report Status 12/29/2022 FINAL  Final  Culture, blood (Routine X 2) w Reflex to ID Panel     Status: None   Collection Time: 12/24/22  4:48 PM   Specimen: BLOOD RIGHT HAND  Result Value Ref Range Status   Specimen Description BLOOD RIGHT HAND  Final   Special Requests   Final    BOTTLES DRAWN AEROBIC AND ANAEROBIC Blood Culture adequate volume   Culture   Final    NO GROWTH 5 DAYS Performed at [facility], [address], [place], [place] [address]    Report Status 12/29/2022 FINAL  Final    Studies/Results: MR WRIST RIGHT W WO CONTRAST  Result Date: 01/05/2023 CLINICAL DATA:  Soft tissue infection EXAM: MR OF THE RIGHT WRIST WITHOUT AND WITH CONTRAST TECHNIQUE: Multiplanar multisequence MR imaging of the right wrist was performed both before and after the administration of intravenous contrast. The exam was centered up in the central hand region more than the wrist region, as this is the site of suspected infection based on prior imaging. CONTRAST:  10mL GADAVIST GADOBUTROL 1 MMOL/ML IV SOLN COMPARISON:  12/27/2022 FINDINGS: Despite efforts by the technologist and patient, severe motion artifact is present on today's exam and  could not be eliminated. This reduces exam sensitivity and specificity. Ligaments: Torn scapholunate ligament. Triangular fibrocartilage: Not characterized in an ideal fashion, no obvious defect. Tendons: Tenosynovitis is again observed in the second extensor compartment. Carpal tunnel/median nerve: Low-grade edema tracks in the carpal tunnel without expansion of the median nerve identified. Guyon's canal: No obvious or large impinging lesions although motion artifact contributes to suboptimal assessment. Joint/cartilage: Moderate effusion the radiocarpal joint and along the midcarpal row dorsally. Synovial enhancement in this region noted. Bones/carpal alignment: Adjacent abnormal edema signal in the scaphoid and lunate, probably degenerative. Small geodes along the triquetrum, hamate, and trapezoid. Further distally in the hand, there is extensive edema, enhancement, and heterogeneity in the head and distal metaphysis of the index finger metacarpal, and in the proximal phalanx of the index finger, compatible with osteomyelitis and raising suspicion for a septic index finger MCP joint. Imaging does not include the middle or distal phalanges. Small foci subcortical edema signal along the middle finger and ring finger MCP joints, likely degenerative. Other: Connecting to the second digit MCP joint, a volar abscess measuring about 3.4 by 1.0 by 3.2 cm (volume = 5.7 cm^3) is observed as shown for example on image 9 of series 17. This is deep to the flexor tendons and primarily along the abductor pollicis and adjacent interosseous muscles. Upper there is also a smaller dorsal abscess extending proximally from the region of the joint but deep to the extensor  tendons, measuring about 2.6 by 0.7 by 2.3 cm (volume = 2 cm^3). There is substantial edema and enhancement tracking in the dorsal subcutaneous tissues the hand likely reflecting cellulitis. There is also edema and low-grade enhancement tracking on fascia planes  throughout the visualized portion of the hand suspicious for local inflammation and possibly fasciitis. No obvious gas in the soft tissues noted. IMPRESSION: 1. 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 There are 2 adjacent abscesses extending proximally from the joint, the larger palmar to the joint and extending along the abductor pollicis muscle, and the smaller abscess dorsal to the index finger metacarpal. 2. Dorsal cellulitis in the hand. 3. Edema and low-grade enhancement tracking on fascia planes throughout the visualized portion of the hand suspicious for local inflammation and possibly fasciitis. 4. Torn scapholunate ligament. 5. Moderate effusion of the radiocarpal joint and along the midcarpal row dorsally. 6. Degenerative findings in the carpus and along the middle and ring finger MCP joints. 7. Despite efforts by the technologist and patient, severe motion artifact is present on today's exam and could not be eliminated. This adversely affects exam sensitivity and specificity. Electronically Signed   By: Gaylyn Rong M.D.   On: 01/05/2023 10:27      Assessment/Plan:  INTERVAL HISTORY: Hand surgery still being considered   Principal Problem:   Discitis of lumbar region Active Problems:   Diabetes mellitus type 2 in nonobese (HCC)   Dyslipidemia   Essential hypertension   New onset atrial fibrillation (HCC)   Sepsis (HCC)   BPH (benign prostatic hyperplasia)   Mood disorder (HCC)   MSSA bacteremia    Jim Ramirez is a 72 y.o. 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  Given the burden of infection I would think he needs quite -protracted therapy likely a year if not indefinitely  That being said the infection in his hand and finger would seem in contrast to his spine a much more straightforward surgery which could DEF help with source control  We will continue nafcillin at present  WIll likely repeat imaging of the spine at some point  I have personally spent 50 minutes involved in face-to-face and non-face-to-face activities for this patient on the day of the visit. Professional time spent includes the following activities: Preparing to see the patient (review of tests), Obtaining and/or reviewing separately obtained history (admission/discharge record), Performing a medically appropriate examination and/or evaluation , Ordering medications/tests/procedures, referring and communicating with other health care professionals, Documenting clinical information in the EMR, Independently interpreting results (not separately reported), Communicating results to the patient/family/caregiver, Counseling and educating the patient/family/caregiver and Care coordination (not separately reported).      LOS: 13 days   Acey Lav 01/06/2023, 10:29 AM

## 2023-01-06 NOTE — Consult Note (Signed)
Orthopedic Hand Surgery Consultation:  Reason for Consult: Right index finger MCP joint infection Referring Physician: Dr. Nelson Chimes   HPI: Jim Ramirez is a(an) 72 y.o. male   With MRI performed indicating right index finger MCP joint septic arthritis and overlying abscess and possible osteomyelitis of the index finger metacarpal and proximal phalanx.  I was consulted previously for evaluation of his right wrist which did not indicate any signs of septic arthritis.  He had edema to bilateral upper extremities and lower extremities this edema has completely resolved.  He did have a IV in the area of the dorsal right hand he has now developed infection in this area and a new consult was placed to evaluate this new location to the dorsal aspect of the right index finger at the MCP joint.  Physical exam of the right hand shows minimal swelling dramatic improvement compared to last week.  He now has no signs of overlying erythema or abscess no fluctuance throughout the volar or dorsal soft tissues.  He does have pain in the index finger MCP joint isolated to this area.  No pain in the rest he is able to achieve full composite fist and full extension of the digits despite the pain in his MCP joint he is able to use it for eating without much issue.  Sensations intact to light touch and brisk cap refill tips of all fingers.  Overall the patient says he does have pain in this area however it is nothing compared to his low back.  He would like to have this aspirated which we discussed can be done at bedside under local anesthesia and potentially bedside lavage of the joint can be performed which would keep the risk profile low and could provide some local control of the area.  We did have an extensive family meeting regarding whether or not he is a surgical candidate and the family has elected to continue with treatment I think this is a good option for him right now as he is not going to have his low back  infection treated he will likely continue to have bacteremia and could potentially seed new sites.  Right now his right hand exam overall is much improved compared to last week however is now isolated to the right index finger MCP joint.  Bedside procedure performed for joint aspiration at right index MCP joint. This was prepped and entered with an 18-gauge needle from dorsal approach. Fluid was aspirated however not enough was able to be obtained for sample no signs of gross purulence.  The joint was thoroughly lavaged with sterile saline.  This fluid was then removed.  The patient tolerated the procedure well and a Band-Aid was placed.  The patient can continue to use the right hand without restriction.  Continue IV antibiotics.  Will continue to monitor for resolution of the symptoms. No indication for surgical intervention.   Mathis Dad, MD Orthopaedic Hand Surgeon [facility] Office number: [phone] [address], [address] [place], [place] [address]    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   CTA negative for aneurysm   Depressed    Diabetes mellitus without complication (HCC)    DVT (deep venous thrombosis) (HCC)    History of kidney stones    Hypertension    Nephrolithiasis    Sleep apnea     Past Surgical History:  Procedure Laterality Date   ANTERIOR CERVICAL DECOMP/DISCECTOMY FUSION N/A 05/22/2022  Procedure: ANTERIOR CERVICAL DISCECTOMY FUSION, INTERBODY PROSTHESIS,PLATE/SCREWS CERVICAL THREE-FOUR, CERVICAL FOUR-FIVE;REMOVAL CERVICAL PLATE;  Surgeon: Tressie Stalker, MD;  Location: [facility] OR;  Service: Neurosurgery;  Laterality: N/A;  3C   CATARACT EXTRACTION W/PHACO Left 09/23/2022   Procedure: CATARACT EXTRACTION PHACO AND INTRAOCULAR LENS PLACEMENT (IOC);  Surgeon: Fabio Pierce, MD;  Location: [facility] ORS;  Service: Ophthalmology;  Laterality: Left;  CDE: 7.52   CATARACT EXTRACTION W/PHACO Right  10/07/2022   Procedure: CATARACT EXTRACTION PHACO AND INTRAOCULAR LENS PLACEMENT (IOC);  Surgeon: Fabio Pierce, MD;  Location: [facility];  Service: Ophthalmology;  Laterality: Right;  CDE: 9.40   STOMACH SURGERY     TEE WITHOUT CARDIOVERSION N/A 12/31/2022   Procedure: TRANSESOPHAGEAL ECHOCARDIOGRAM;  Surgeon: Little Ishikawa, MD;  Location: [facility] INVASIVE CV LAB;  Service: Cardiovascular;  Laterality: N/A;   TONSILLECTOMY  2000    Family History  Problem Relation Age of Onset   Heart attack Mother    Heart attack Father    Hypertension Brother    Diabetes Brother    Coronary artery disease Brother     Social History:  reports that he quit smoking about 30 years ago. His smoking use included cigarettes. He has never been exposed to tobacco smoke. He has never used smokeless tobacco. He reports that he does not currently use alcohol. He reports that he does not use drugs.  Allergies: No Known Allergies  Medications: reviewed, no changes to patient's home medications  Results for orders placed or performed during the hospital encounter of 12/24/22 (from the past 48 hour(s))  Glucose, capillary     Status: Abnormal   Collection Time: 01/04/23  8:35 PM  Result Value Ref Range   Glucose-Capillary 104 (H) 70 - 99 mg/dL    Comment: Glucose reference range applies only to samples taken after fasting for at least 8 hours.  CBC     Status: Abnormal   Collection Time: 01/05/23  5:15 AM  Result Value Ref Range   WBC 7.6 4.0 - 10.5 K/uL   RBC 3.70 (L) 4.22 - 5.81 MIL/uL   Hemoglobin 9.4 (L) 13.0 - 17.0 g/dL   HCT 16.1 (L) 09.6 - 04.5 %   MCV 82.7 80.0 - 100.0 fL   MCH 25.4 (L) 26.0 - 34.0 pg   MCHC 30.7 30.0 - 36.0 g/dL   RDW 40.9 (H) 81.1 - 91.4 %   Platelets 331 150 - 400 K/uL   nRBC 0.0 0.0 - 0.2 %    Comment: Performed at [facility], [address], [place], [place] [address]  Basic metabolic panel     Status: Abnormal   Collection Time: 01/05/23  5:15 AM  Result Value  Ref Range   Sodium 139 135 - 145 mmol/L   Potassium 2.8 (L) 3.5 - 5.1 mmol/L   Chloride 99 98 - 111 mmol/L   CO2 32 22 - 32 mmol/L   Glucose, Bld 126 (H) 70 - 99 mg/dL    Comment: Glucose reference range applies only to samples taken after fasting for at least 8 hours.   BUN 13 8 - 23 mg/dL   Creatinine, Ser 6.21 0.61 - 1.24 mg/dL   Calcium 7.8 (L) 8.9 - 10.3 mg/dL   GFR, Estimated >30 >86 mL/min    Comment: (NOTE) Calculated using the CKD-EPI Creatinine Equation (2021)    Anion gap 8 5 - 15    Comment: Performed at [facility], [address] [place], [place] [address]  Glucose, capillary  Status: Abnormal   Collection Time: 01/05/23  6:00 AM  Result Value Ref Range   Glucose-Capillary 116 (H) 70 - 99 mg/dL    Comment: Glucose reference range applies only to samples taken after fasting for at least 8 hours.  Glucose, capillary     Status: Abnormal   Collection Time: 01/05/23 11:10 AM  Result Value Ref Range   Glucose-Capillary 197 (H) 70 - 99 mg/dL    Comment: Glucose reference range applies only to samples taken after fasting for at least 8 hours.  Glucose, capillary     Status: Abnormal   Collection Time: 01/05/23  4:15 PM  Result Value Ref Range   Glucose-Capillary 127 (H) 70 - 99 mg/dL    Comment: Glucose reference range applies only to samples taken after fasting for at least 8 hours.  Glucose, capillary     Status: Abnormal   Collection Time: 01/05/23  9:09 PM  Result Value Ref Range   Glucose-Capillary 139 (H) 70 - 99 mg/dL    Comment: Glucose reference range applies only to samples taken after fasting for at least 8 hours.   Comment 1 Notify RN   CBC     Status: Abnormal   Collection Time: 01/06/23  4:24 AM  Result Value Ref Range   WBC 9.4 4.0 - 10.5 K/uL   RBC 3.75 (L) 4.22 - 5.81 MIL/uL   Hemoglobin 9.7 (L) 13.0 - 17.0 g/dL   HCT 16.1 (L) 09.6 - 04.5 %   MCV 83.7 80.0 - 100.0 fL   MCH 25.9 (L) 26.0 - 34.0 pg   MCHC 30.9 30.0 - 36.0 g/dL   RDW 40.9  (H) 81.1 - 15.5 %   Platelets 325 150 - 400 K/uL   nRBC 0.0 0.0 - 0.2 %    Comment: Performed at [facility], [address] [place], [place] [address]  Basic metabolic panel     Status: Abnormal   Collection Time: 01/06/23  4:24 AM  Result Value Ref Range   Sodium 136 135 - 145 mmol/L   Potassium 3.0 (L) 3.5 - 5.1 mmol/L   Chloride 95 (L) 98 - 111 mmol/L   CO2 31 22 - 32 mmol/L   Glucose, Bld 164 (H) 70 - 99 mg/dL    Comment: Glucose reference range applies only to samples taken after fasting for at least 8 hours.   BUN 12 8 - 23 mg/dL   Creatinine, Ser 2.95 0.61 - 1.24 mg/dL   Calcium 7.9 (L) 8.9 - 10.3 mg/dL   GFR, Estimated >62 >13 mL/min    Comment: (NOTE) Calculated using the CKD-EPI Creatinine Equation (2021)    Anion gap 10 5 - 15    Comment: Performed at [facility], [address] [place], [place] [address]  Magnesium     Status: None   Collection Time: 01/06/23  4:24 AM  Result Value Ref Range   Magnesium 1.7 1.7 - 2.4 mg/dL    Comment: Performed at [facility], [address], [place], [place] [address]  Brain natriuretic peptide     Status: None   Collection Time: 01/06/23  4:24 AM  Result Value Ref Range   B Natriuretic Peptide 51.0 0.0 - 100.0 pg/mL    Comment: Performed at [facility], [address] [place], [place] [address]  Glucose, capillary     Status: Abnormal   Collection Time: 01/06/23  6:13 AM  Result Value Ref Range  Glucose-Capillary 138 (H) 70 - 99 mg/dL    Comment: Glucose reference range applies only to samples taken after fasting for at least 8 hours.   Comment 1 Notify RN   Glucose, capillary     Status: Abnormal   Collection Time: 01/06/23 11:04 AM  Result Value Ref Range   Glucose-Capillary 130 (H) 70 - 99 mg/dL    Comment: Glucose reference range applies only to samples taken after fasting for at least 8 hours.  Glucose, capillary     Status: Abnormal   Collection Time: 01/06/23 11:08 AM  Result Value Ref  Range   Glucose-Capillary 123 (H) 70 - 99 mg/dL    Comment: Glucose reference range applies only to samples taken after fasting for at least 8 hours.  Glucose, capillary     Status: Abnormal   Collection Time: 01/06/23  5:38 PM  Result Value Ref Range   Glucose-Capillary 105 (H) 70 - 99 mg/dL    Comment: Glucose reference range applies only to samples taken after fasting for at least 8 hours.    MR WRIST RIGHT W WO CONTRAST  Result Date: 01/05/2023 CLINICAL DATA:  Soft tissue infection EXAM: MR OF THE RIGHT WRIST WITHOUT AND WITH CONTRAST TECHNIQUE: Multiplanar multisequence MR imaging of the right wrist was performed both before and after the administration of intravenous contrast. The exam was centered up in the central hand region more than the wrist region, as this is the site of suspected infection based on prior imaging. CONTRAST:  10mL GADAVIST GADOBUTROL 1 MMOL/ML IV SOLN COMPARISON:  12/27/2022 FINDINGS: Despite efforts by the technologist and patient, severe motion artifact is present on today's exam and could not be eliminated. This reduces exam sensitivity and specificity. Ligaments: Torn scapholunate ligament. Triangular fibrocartilage: Not characterized in an ideal fashion, no obvious defect. Tendons: Tenosynovitis is again observed in the second extensor compartment. Carpal tunnel/median nerve: Low-grade edema tracks in the carpal tunnel without expansion of the median nerve identified. Guyon's canal: No obvious or large impinging lesions although motion artifact contributes to suboptimal assessment. Joint/cartilage: Moderate effusion the radiocarpal joint and along the midcarpal row dorsally. Synovial enhancement in this region noted. Bones/carpal alignment: Adjacent abnormal edema signal in the scaphoid and lunate, probably degenerative. Small geodes along the triquetrum, hamate, and trapezoid. Further distally in the hand, there is extensive edema, enhancement, and heterogeneity in the  head and distal metaphysis of the index finger metacarpal, and in the proximal phalanx of the index finger, compatible with osteomyelitis and raising suspicion for a septic index finger MCP joint. Imaging does not include the middle or distal phalanges. Small foci subcortical edema signal along the middle finger and ring finger MCP joints, likely degenerative. Other: Connecting to the second digit MCP joint, a volar abscess measuring about 3.4 by 1.0 by 3.2 cm (volume = 5.7 cm^3) is observed as shown for example on image 9 of series 17. This is deep to the flexor tendons and primarily along the abductor pollicis and adjacent interosseous muscles. Upper there is also a smaller dorsal abscess extending proximally from the region of the joint but deep to the extensor tendons, measuring about 2.6 by 0.7 by 2.3 cm (volume = 2 cm^3). There is substantial edema and enhancement tracking in the dorsal subcutaneous tissues the hand likely reflecting cellulitis. There is also edema and low-grade enhancement tracking on fascia planes throughout the visualized portion of the hand suspicious for local inflammation and possibly fasciitis. No obvious gas in the soft tissues noted.  IMPRESSION: 1. 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 There are 2 adjacent abscesses extending proximally from the joint, the larger palmar to the joint and extending along the abductor pollicis muscle, and the smaller abscess dorsal to the index finger metacarpal. 2. Dorsal cellulitis in the hand. 3. Edema and low-grade enhancement tracking on fascia planes throughout the visualized portion of the hand suspicious for local inflammation and possibly fasciitis. 4. Torn scapholunate ligament. 5. Moderate effusion of the radiocarpal joint and along the midcarpal row dorsally. 6. Degenerative findings in the carpus and along the middle and ring finger MCP joints. 7. Despite efforts by the  technologist and patient, severe motion artifact is present on today's exam and could not be eliminated. This adversely affects exam sensitivity and specificity. Electronically Signed   By: Gaylyn Rong M.D.   On: 01/05/2023 10:27

## 2023-01-06 NOTE — Progress Notes (Signed)
Patient ID: Jim Ramirez, male   DOB: 10-12-1950, 72 y.o.   MRN: [id number]   LOS: 13 days   Subjective: No new hand c/o this morning. Said both hands were hurting but now having pain now.   Objective: Vital signs in last 24 hours: Temp:  [97.9 F (36.6 C)-99.4 F (37.4 C)] 99.4 F (37.4 C) (06/10 0751) Pulse Rate:  [69-86] 82 (06/10 0751) Resp:  [17-22] 17 (06/10 0751) BP: (110-135)/(44-91) 118/62 (06/10 0751) SpO2:  [90 %-98 %] 98 % (06/10 0235) Last BM Date : 01/05/23   Laboratory  CBC Recent Labs    01/05/23 0515 01/06/23 0424  WBC 7.6 9.4  HGB 9.4* 9.7*  HCT 30.6* 31.4*  PLT 331 325   BMET Recent Labs    01/05/23 0515 01/06/23 0424  NA 139 136  K 2.8* 3.0*  CL 99 95*  CO2 32 31  GLUCOSE 126* 164*  BUN 13 12  CREATININE 0.71 0.77  CALCIUM 7.8* 7.9*     Physical Exam General appearance: alert and no distress Right shoulder, elbow, wrist, digits- no skin wounds, nontender, no instability, no blocks to motion, dorsal erythema index P1, mild edema P1/P2, no TTP, no pain with A/PROM MCP joint,  Sens  Ax/R/M/U intact  Mot   Ax/ R/ PIN/ M/ AIN/ U intact  Rad 2+    Assessment/Plan: Right index finger infection -- Despite very benign exam may benefit from I&D given MRI findings in light of overall clinical picture. Understand family meeting to take place today. If deemed acceptable surgical risk could consider I&D though appears afeb with NL WBC so benefit may be marginal.    Freeman Caldron, PA-C Orthopedic Surgery [phone] 01/06/2023

## 2023-01-06 NOTE — TOC Progression Note (Signed)
Transition of Care [facility]) - Progression Note    Patient Details  Name: Jim Ramirez MRN: [id number] Date of Birth: 30-Apr-1951  Transition of Care [facility]) CM/SW Contact  Harriet Masson, RN Phone Number: 01/06/2023, 6:34 PM  Clinical Narrative:     MD states palliative is scheduled to have family meeting and after the meeting LTAC can be  considered.  Awaiting for results of meeting.        Expected Discharge Plan and Services                                               Social Determinants of Health (SDOH) Interventions SDOH Screenings   Transportation Needs: No Transportation Needs (12/26/2022)  Utilities: Not At Risk (12/26/2022)  Tobacco Use: Medium Risk (01/01/2023)    Readmission Risk Interventions     No data to display

## 2023-01-06 NOTE — Progress Notes (Signed)
Palliative Medicine Progress Note   Patient Name: Jim Ramirez       Date: 01/06/2023 DOB: [birth year]  Age: 72 y.o. MRN#: [id number] Attending Physician: Dimple Nanas, MD Primary Care Physician: [name], MD Admit Date: 12/24/2022  Reason for Consultation/Follow-up: {Reason for Consult:23484}  HPI/Patient Profile: 72 y.o. male  with past medical history of DM and HTN who initially presented to [facility] on 12/22/2022 with complaint of back pain with radiation down both legs.  He was found to have MSSA bacteremia and osteonecrosis of the lumbar spine.  He was also found to have new onset A-fib with RVR.  He was transferred to [facility] on 12/24/2022 for further management of severe sepsis.    Palliative Medicine was consulted for goals of care conversations.  Subjective: ***  Objective:  Physical Exam Vitals reviewed.  Constitutional:      General: He is not in acute distress.    Appearance: He is ill-appearing.  Pulmonary:     Effort: Pulmonary effort is normal.  Neurological:     Mental Status: He is alert and oriented to person, place, and time.             Vital Signs: BP (!) 133/59 (BP Location: Right Arm)   Pulse 82   Temp 98.6 F (37 C) (Oral)   Resp 20   Ht 6' (1.829 m)   Wt 86.5 kg   SpO2 95%   BMI 25.86 kg/m  SpO2: SpO2: 95 % O2 Device: O2 Device: Nasal Cannula O2 Flow Rate: O2 Flow Rate (L/min): 2 L/min    LBM: Last BM Date : 01/05/23     Palliative Assessment/Data: ***     Palliative Medicine Assessment & Plan   Assessment: Principal Problem:   Discitis of lumbar region Active Problems:   Diabetes mellitus type 2 in nonobese (HCC)   Dyslipidemia   Essential hypertension   New onset atrial fibrillation (HCC)   Sepsis (HCC)   BPH  (benign prostatic hyperplasia)   Mood disorder (HCC)   MSSA bacteremia    Recommendations/Plan: ***  Goals of Care and Additional Recommendations: Limitations on Scope of Treatment: {Recommended Scope and Preferences:21019}  Code Status:   Prognosis:  {Palliative Care Prognosis:23504}  Discharge Planning: {Palliative dispostion:23505}  Care plan was discussed with ***  Thank you for allowing  the Palliative Medicine Team to assist in the care of this patient.   ***   Merry Proud, NP   Please contact Palliative Medicine Team phone at [phone] for questions and concerns.  For individual providers, please see AMION.

## 2023-01-06 NOTE — Progress Notes (Signed)
   01/06/23 2320  BiPAP/CPAP/SIPAP  BiPAP/CPAP/SIPAP Pt Type Adult  Reason BIPAP/CPAP not in use Non-compliant (refused)

## 2023-01-06 NOTE — Progress Notes (Signed)
PROGRESS NOTE    Jim Ramirez  GNF:[id number] DOB: [birth year] DOA: 12/24/2022 PCP: [name], MD   Brief Narrative:  72 y.o. male past medical history of hypertension and diabetes initially presented to [facility] on 12/22/2022 for chronic back pain radiating both her legs, with some shortness of breath that started 1 week prior to admission CT scan of the abdomen pelvis or lumbar spine discitis and osteomyelitis, transferred to [facility] found to be bacteremic with staph aureus with necrosis of the lumbar spine, also in A-fib with RVR, neurology, ID and cardiology were consulted. MRI of the wrist was done that showed fluid collection and surgery was consulted who recommended conservative management. Despite IV antibiotics he continues to spike fevers.  MRI lumbar spine done on 6/7 showed worsening of infection/concerns of epidural abscess but neurosurgery recommending against any surgical intervention. Initial blood cultures at [facility] grew MSSA. He was started on and, surveillance cultures remain negative.  TEE from 6/4 does not show any vegetation but MRI does show small fluid collection of the rest and surgery recommended conservative management.  Infectious disease recommended IV nafcillin from negative cultures on 5/28.   Assessment & Plan:  Principal Problem:   Discitis of lumbar region Active Problems:   Diabetes mellitus type 2 in nonobese (HCC)   Dyslipidemia   Essential hypertension   New onset atrial fibrillation (HCC)   Sepsis (HCC)   BPH (benign prostatic hyperplasia)   Mood disorder (HCC)   MSSA bacteremia    Severe sepsis secondary to persistent vertebral osteonecrosis/infarct of L4-L-L5 with discitis/septic arthritis and multiple loculated fluid collection around psoas muscle MSSA bacteremia -Initial blood cultures are growing MSSA and was started on IV vancomycin, eventually surveillance cultures remain negative and patient has been on IV nafcillin with ID  recommendations.  Total of 6 weeks from negative culture on 5/20. -MRI of the wrist also showed small fluid collection, home hand surgery recommended symptomatic management -MRI lumbar spine shows lumbar discitis, osteomyelitis and epidural abscess-neurosurgery at this point is recommending any surgical intervention as it will not change any prognosis especially patient does not improve on IV antibiotics.   left wrist edema/concern for tenosynovitis: -Conservative management per orthopedic. MRI shows persistent and worsening abscess of the right wrist, [facility] to reevaluate the patient today.  At this time concern is even if we were able to perform I&D, chances of reinfection remains very high  Abnormal breath sounds - Continue Lasix twice daily.  Aggressive use of bronchodilators and IV/flutter.  Out of bed to chair   Osteonecrosis of L4-L5: -Pain control.  Not a surgical candidate   New onset atrial fibrillation: -Currently on amiodarone, Lovenox every 12 hours.  Iron deficiency anemia:  Iron supplements with bowel regimen   Hypokalemia:  As needed repletion   Acute kidney injury: Baseline creatinine 0.7, resolved   Acute thrombocytopenia: Resolved   Elevated LFTs and hyperbilirubinemia: Stable   Elevate troponin: -Likely demand ischemia.  Patient denies any chest pain.   BPH: -Continue Flomax.   Diabetes mellitus type 2 with hyperglycemia: -With an A1c of 8.1. Sliding scale and Accu-Chek.  Semglee 15 units at bedtime   Obstructive sleep apnea: -Continue CPAP at night.   Hyperlipidemia:  -continue statins.   Mood disorder: -Continue Lexapro.   Moderate protein caloric malnutrition: -Noted.  Patient has already been seen by palliative care.  Currently DNR but continue full treatment Ongoing discussions by palliative regarding goals of care. Overall very poor prognosis  DVT prophylaxis: Heparin Code Status: DNR Family Communication:  None present at  bedside.  Plan of care discussed with patient in length and he verbalized understanding and agreed with it.      Pressure Injury 01/03/23 Buttocks Right;Left Stage 2 -  Partial thickness loss of dermis presenting as a shallow open injury with a red, pink wound bed without slough. 1x1 (Active)  01/03/23 2100  Location: Buttocks  Location Orientation: Right;Left  Staging: Stage 2 -  Partial thickness loss of dermis presenting as a shallow open injury with a red, pink wound bed without slough.  Wound Description (Comments): 1x1  Present on Admission: No     Diet Orders (From admission, onward)     Start     Ordered   01/06/23 0001  Diet NPO time specified  Diet effective midnight        01/05/23 1328            Subjective: Overnight having some agitation.  Overall very weak.   Examination:  General exam: Appears calm and comfortable, on 3 L nasal cannula Respiratory system: rhonchi at b/l bases.  Cardiovascular system: S1 & S2 heard, RRR. No JVD, murmurs, rubs, gallops or clicks. No pedal edema. Gastrointestinal system: Abdomen is nondistended, soft and nontender. No organomegaly or masses felt. Normal bowel sounds heard. Central nervous system: Alert and oriented. No focal neurological deficits. Extremities: Symmetric 5 x 5 power. Skin: No rashes, lesions or ulcers Psychiatry: Judgement and insight appear normal. Mood & affect appropriate.  Objective: Vitals:   01/05/23 2022 01/05/23 2300 01/05/23 2308 01/06/23 0235  BP:  (!) 121/44  (!) 134/91  Pulse:      Resp:  20  19  Temp:  98 F (36.7 C)  97.9 F (36.6 C)  TempSrc:  Oral Oral Oral  SpO2: 93% 97%  98%  Weight:      Height:        Intake/Output Summary (Last 24 hours) at 01/06/2023 0727 Last data filed at 01/05/2023 1917 Gross per 24 hour  Intake 219.58 ml  Output 3300 ml  Net -3080.42 ml   Filed Weights   12/31/22 1600 01/01/23 1500 01/05/23 0346  Weight: 93 kg 93 kg 86.5 kg    Scheduled Meds:   acetaminophen  500 mg Oral TID   amiodarone  400 mg Oral BID   Followed by   Melene Muller ON 01/08/2023] amiodarone  200 mg Oral Daily   azelastine  1 spray Each Nare BID   Chlorhexidine Gluconate Cloth  6 each Topical Daily   enoxaparin (LOVENOX) injection  90 mg Subcutaneous Q12H   escitalopram  10 mg Oral Daily   ferrous sulfate  325 mg Oral Q breakfast   fluticasone  2 spray Each Nare Daily   furosemide  40 mg Intravenous BID   gabapentin  100 mg Oral BID   insulin aspart  0-15 Units Subcutaneous TID WC   insulin aspart  0-5 Units Subcutaneous QHS   insulin glargine-yfgn  15 Units Subcutaneous QHS   ipratropium-albuterol  3 mL Nebulization BID   ketotifen  1 drop Left Eye BID   lidocaine  2 patch Transdermal Q24H   loratadine  10 mg Oral Daily   melatonin  3 mg Oral QHS   methocarbamol  500 mg Oral TID   pantoprazole  40 mg Oral BID   rosuvastatin  20 mg Oral Daily   sodium chloride flush  10-40 mL Intracatheter Q12H   sodium chloride flush  3  mL Intravenous Q12H   tamsulosin  0.4 mg Oral Daily   Continuous Infusions:  [START ON 01/11/2023]  ceFAZolin (ANCEF) IV     lactated ringers     nafcillin 12 g in sodium chloride 0.9 % 500 mL continuous infusion 20.8 mL/hr at 01/05/23 1901    Nutritional status     Body mass index is 25.86 kg/m.  Data Reviewed:   CBC: Recent Labs  Lab 12/31/22 0450 01/01/23 0252 01/02/23 0056 01/03/23 1003 01/04/23 0430 01/05/23 0515 01/06/23 0424  WBC 14.8*   < > 13.7* 10.6* 8.5 7.6 9.4  NEUTROABS 12.0*  --   --   --   --   --   --   HGB 10.4*   < > 10.0* 10.1* 9.2* 9.4* 9.7*  HCT 33.6*   < > 31.9* 32.4* 30.0* 30.6* 31.4*  MCV 80.8   < > 82.6 83.5 83.6 82.7 83.7  PLT 158   < > 212 264 286 331 325   < > = values in this interval not displayed.   Basic Metabolic Panel: Recent Labs  Lab 12/31/22 0450 01/01/23 0252 01/02/23 0056 01/03/23 1003 01/04/23 0430 01/05/23 0515 01/06/23 0424  NA 140 137 138 139 140 139 136  K 3.5 3.4* 4.2  2.7* 3.0* 2.8* 3.0*  CL 104 99 99 98 99 99 95*  CO2 28 28 30  32 32 32 31  GLUCOSE 236* 227* 134* 237* 146* 126* 164*  BUN 17 17 15 13 15 13 12   CREATININE 0.65 0.71 0.72 0.78 0.78 0.71 0.77  CALCIUM 7.3* 7.4* 7.6* 7.6* 7.7* 7.8* 7.9*  MG 2.0 1.9  --  2.0 1.9  --  1.7   GFR: Estimated Creatinine Clearance: 94.3 mL/min (by C-G formula based on SCr of 0.77 mg/dL). Liver Function Tests: Recent Labs  Lab 12/31/22 0450 01/01/23 0252  AST 17 23  ALT 15 12  ALKPHOS 80 88  BILITOT 0.8 0.6  PROT 4.9* 5.2*  ALBUMIN <1.5* <1.5*   No results for input(s): "LIPASE", "AMYLASE" in the last 168 hours. No results for input(s): "AMMONIA" in the last 168 hours. Coagulation Profile: No results for input(s): "INR", "PROTIME" in the last 168 hours. Cardiac Enzymes: No results for input(s): "CKTOTAL", "CKMB", "CKMBINDEX", "TROPONINI" in the last 168 hours. BNP (last 3 results) No results for input(s): "PROBNP" in the last 8760 hours. HbA1C: No results for input(s): "HGBA1C" in the last 72 hours. CBG: Recent Labs  Lab 01/05/23 0600 01/05/23 1110 01/05/23 1615 01/05/23 2109 01/06/23 0613  GLUCAP 116* 197* 127* 139* 138*   Lipid Profile: No results for input(s): "CHOL", "HDL", "LDLCALC", "TRIG", "CHOLHDL", "LDLDIRECT" in the last 72 hours. Thyroid Function Tests: No results for input(s): "TSH", "T4TOTAL", "FREET4", "T3FREE", "THYROIDAB" in the last 72 hours. Anemia Panel: Recent Labs    01/04/23 0850  TIBC 134*  IRON 9*   Sepsis Labs: No results for input(s): "PROCALCITON", "LATICACIDVEN" in the last 168 hours.  No results found for this or any previous visit (from the past 240 hour(s)).       Radiology Studies: MR WRIST RIGHT W WO CONTRAST  Result Date: 01/05/2023 CLINICAL DATA:  Soft tissue infection EXAM: MR OF THE RIGHT WRIST WITHOUT AND WITH CONTRAST TECHNIQUE: Multiplanar multisequence MR imaging of the right wrist was performed both before and after the administration of  intravenous contrast. The exam was centered up in the central hand region more than the wrist region, as this is the site of suspected infection based  on prior imaging. CONTRAST:  10mL GADAVIST GADOBUTROL 1 MMOL/ML IV SOLN COMPARISON:  12/27/2022 FINDINGS: Despite efforts by the technologist and patient, severe motion artifact is present on today's exam and could not be eliminated. This reduces exam sensitivity and specificity. Ligaments: Torn scapholunate ligament. Triangular fibrocartilage: Not characterized in an ideal fashion, no obvious defect. Tendons: Tenosynovitis is again observed in the second extensor compartment. Carpal tunnel/median nerve: Low-grade edema tracks in the carpal tunnel without expansion of the median nerve identified. Guyon's canal: No obvious or large impinging lesions although motion artifact contributes to suboptimal assessment. Joint/cartilage: Moderate effusion the radiocarpal joint and along the midcarpal row dorsally. Synovial enhancement in this region noted. Bones/carpal alignment: Adjacent abnormal edema signal in the scaphoid and lunate, probably degenerative. Small geodes along the triquetrum, hamate, and trapezoid. Further distally in the hand, there is extensive edema, enhancement, and heterogeneity in the head and distal metaphysis of the index finger metacarpal, and in the proximal phalanx of the index finger, compatible with osteomyelitis and raising suspicion for a septic index finger MCP joint. Imaging does not include the middle or distal phalanges. Small foci subcortical edema signal along the middle finger and ring finger MCP joints, likely degenerative. Other: Connecting to the second digit MCP joint, a volar abscess measuring about 3.4 by 1.0 by 3.2 cm (volume = 5.7 cm^3) is observed as shown for example on image 9 of series 17. This is deep to the flexor tendons and primarily along the abductor pollicis and adjacent interosseous muscles. Upper there is also a  smaller dorsal abscess extending proximally from the region of the joint but deep to the extensor tendons, measuring about 2.6 by 0.7 by 2.3 cm (volume = 2 cm^3). There is substantial edema and enhancement tracking in the dorsal subcutaneous tissues the hand likely reflecting cellulitis. There is also edema and low-grade enhancement tracking on fascia planes throughout the visualized portion of the hand suspicious for local inflammation and possibly fasciitis. No obvious gas in the soft tissues noted. IMPRESSION: 1. 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 There are 2 adjacent abscesses extending proximally from the joint, the larger palmar to the joint and extending along the abductor pollicis muscle, and the smaller abscess dorsal to the index finger metacarpal. 2. Dorsal cellulitis in the hand. 3. Edema and low-grade enhancement tracking on fascia planes throughout the visualized portion of the hand suspicious for local inflammation and possibly fasciitis. 4. Torn scapholunate ligament. 5. Moderate effusion of the radiocarpal joint and along the midcarpal row dorsally. 6. Degenerative findings in the carpus and along the middle and ring finger MCP joints. 7. Despite efforts by the technologist and patient, severe motion artifact is present on today's exam and could not be eliminated. This adversely affects exam sensitivity and specificity. Electronically Signed   By: Gaylyn Rong M.D.   On: 01/05/2023 10:27           LOS: 13 days   Time spent= 35 mins    Ayanah Snader Joline Maxcy, MD [facility]  If 7PM-7AM, please contact night-coverage  01/06/2023, 7:27 AM

## 2023-01-07 ENCOUNTER — Other Ambulatory Visit (HOSPITAL_COMMUNITY): Payer: Self-pay

## 2023-01-07 DIAGNOSIS — L02511 Cutaneous abscess of right hand: Secondary | ICD-10-CM | POA: Diagnosis not present

## 2023-01-07 DIAGNOSIS — M4646 Discitis, unspecified, lumbar region: Secondary | ICD-10-CM | POA: Diagnosis not present

## 2023-01-07 DIAGNOSIS — B9561 Methicillin susceptible Staphylococcus aureus infection as the cause of diseases classified elsewhere: Secondary | ICD-10-CM | POA: Diagnosis not present

## 2023-01-07 LAB — BASIC METABOLIC PANEL
Anion gap: 8 (ref 5–15)
BUN: 17 mg/dL (ref 8–23)
CO2: 30 mmol/L (ref 22–32)
Calcium: 8.2 mg/dL — ABNORMAL LOW (ref 8.9–10.3)
Chloride: 100 mmol/L (ref 98–111)
Creatinine, Ser: 1 mg/dL (ref 0.61–1.24)
GFR, Estimated: >60 mL/min (ref >60)
Glucose, Bld: 136 mg/dL — ABNORMAL HIGH (ref 70–99)
Potassium: 3.5 mmol/L (ref 3.5–5.1)
Sodium: 138 mmol/L (ref 135–145)

## 2023-01-07 LAB — CBC
HCT: 30.5 % — ABNORMAL LOW (ref 39.0–52.0)
Hemoglobin: 9.5 g/dL — ABNORMAL LOW (ref 13.0–17.0)
MCH: 26 pg (ref 26.0–34.0)
MCHC: 31.1 g/dL (ref 30.0–36.0)
MCV: 83.6 fL (ref 80.0–100.0)
Platelets: 366 10*3/uL (ref 150–400)
RBC: 3.65 MIL/uL — ABNORMAL LOW (ref 4.22–5.81)
RDW: 16.7 % — ABNORMAL HIGH (ref 11.5–15.5)
WBC: 8.9 10*3/uL (ref 4.0–10.5)
nRBC: 0 % (ref 0.0–0.2)

## 2023-01-07 LAB — GLUCOSE, CAPILLARY
Glucose-Capillary: 126 mg/dL — ABNORMAL HIGH (ref 70–99)
Glucose-Capillary: 146 mg/dL — ABNORMAL HIGH (ref 70–99)
Glucose-Capillary: 302 mg/dL — ABNORMAL HIGH (ref 70–99)
Glucose-Capillary: 133 mg/dL — ABNORMAL HIGH (ref 70–99)

## 2023-01-07 LAB — MAGNESIUM: Magnesium: 1.8 mg/dL (ref 1.7–2.4)

## 2023-01-07 MED ORDER — MAGNESIUM OXIDE -MG SUPPLEMENT 400 (240 MG) MG PO TABS
800.0000 mg | ORAL_TABLET | Freq: Once | ORAL | Status: AC
  Administered 2023-01-07: 800 mg via ORAL
  Filled 2023-01-07: qty 2

## 2023-01-07 MED ORDER — POTASSIUM CHLORIDE CRYS ER 20 MEQ PO TBCR
40.0000 meq | EXTENDED_RELEASE_TABLET | Freq: Once | ORAL | Status: AC
  Administered 2023-01-07: 40 meq via ORAL
  Filled 2023-01-07: qty 2

## 2023-01-07 NOTE — Progress Notes (Signed)
This chaplain is present for F/U spiritual care and documenting the Pt. Choices for HCPOA.  The chaplain is coordinating spiritual care presence with PT. The Pt. participated in AD education and answered clarifying questions on the role of HCPOA. The Pt. did not complete a Living Will.  The chaplain is present with the Pt., notary, and witnesses for the notarizing of the Pt. HCPOA. The Pt. chose Veva Holes as his healthcare agent. If this person is unwilling or unable to serve in this role, the Pt. next choices are Olevia Perches, and [employer].  The chaplain gave the original AD to the Pt. along with three copies. The chaplain scanned the Pt. AD into the Pt. EMR.  This chaplain is available for F/U spiritual care as needed.  Chaplain Stephanie Acre [phone]

## 2023-01-07 NOTE — Progress Notes (Signed)
Physical Therapy Treatment Patient Details Name: Jim Ramirez MRN: [id number] DOB: January 25, 1951 Today's Date: 01/07/2023   History of Present Illness 72 yo male admitted 5/28 with back pain with osteonecrosis of lumbar spine L4-5. Rt wrist edema with tenosynovitis, new Afib with RVR. MRI brain negative for acute infarct, but with pyogenic ventriculitis. Bil UE superficial thrombosis. PMhx: ACDF, HTN, T2DM    PT Comments    Pt continues to have limited progress but did have slight improvement in EOB balance and bed mobility compared to 01/03/23 treatment.  He required cues for sequencing and transfer technique with mod-max A of 2 for transfers.  Deferred OOB due to pt's confusion and BM needing to return to supine for cleaning. Continue POC as able.     Recommendations for follow up therapy are one component of a multi-disciplinary discharge planning process, led by the attending physician.  Recommendations may be updated based on patient status, additional functional criteria and insurance authorization.  Follow Up Recommendations       Assistance Recommended at Discharge Frequent or constant Supervision/Assistance  Patient can return home with the following Assistance with cooking/housework;Assistance with feeding;Help with stairs or ramp for entrance;Assist for transportation;Direct supervision/assist for financial management;Two people to help with walking and/or transfers;Two people to help with bathing/dressing/bathroom   Equipment Recommendations  Hospital bed;Wheelchair (measurements PT);Wheelchair cushion (measurements PT);BSC/3in1    Recommendations for Other Services       Precautions / Restrictions Precautions Precautions: Back;Fall Precaution Comments: pt educated for back precautiong and brace wear Required Braces or Orthoses: Spinal Brace Spinal Brace: Lumbar corset;Applied in sitting position     Mobility  Bed Mobility Overal bed mobility: Needs Assistance Bed  Mobility: Rolling, Sidelying to Sit, Sit to Sidelying Rolling: Mod assist, +2 for physical assistance Sidelying to sit: HOB elevated, +2 for physical assistance, Mod assist     Sit to sidelying: Max assist, +2 for physical assistance General bed mobility comments: Increased time.  For rolling: multimodal cues to flex opposite knee and reach with mod A to roll, multiple rolls for ADLs (pt had BM); Sidelying to sit: mod A of 2 for trunk and legs with cues    Transfers Overall transfer level: Needs assistance Equipment used: 2 person hand held assist              Lateral/Scoot Transfers: Mod assist, +2 physical assistance General transfer comment: Unable to stand due to weakness and having BM; Nursing reports very confused and trying to get OOB so did not transfer to chair; Did lateral scoot toward [facility] with mod A of 2 using bed pad, assist positioning pt's feet, and cues for pt to lean forward, lift, and scoot.    Ambulation/Gait                   Stairs             Wheelchair Mobility    Modified Rankin (Stroke Patients Only)       Balance Overall balance assessment: Needs assistance Sitting-balance support: Bilateral upper extremity supported Sitting balance-Leahy Scale: Poor Sitting balance - Comments: EOB for 5 mins.  Initially with min A but progressed to min guard for safety.  Tendency to posterior lean Postural control: Posterior lean                                  Cognition Arousal/Alertness: Awake/alert Behavior During Therapy: WFL for tasks assessed/performed  Overall Cognitive Status: Impaired/Different from baseline                   Orientation Level: Time, Disoriented to, Place   Memory: Decreased short-term memory, Decreased recall of precautions       Problem Solving: Slow processing, Difficulty sequencing, Requires tactile cues, Requires verbal cues General Comments: Requiring cues for precautions and sequencing;  difficult to understand at times; thought he was still in [place]; slow processing        Exercises General Exercises - Lower Extremity Long Arc Quad: AROM, Both, 5 reps, Seated    General Comments General comments (skin integrity, edema, etc.): VSS on2L      Pertinent Vitals/Pain Pain Assessment Pain Assessment: Faces Faces Pain Scale: Hurts little more Pain Location: back with movement, none at rest Pain Descriptors / Indicators: Sore, Discomfort Pain Intervention(s): Limited activity within patient's tolerance, Monitored during session, Repositioned    Home Living                          Prior Function            PT Goals (current goals can now be found in the care plan section) Progress towards PT goals: Progressing toward goals    Frequency    Min 1X/week      PT Plan Current plan remains appropriate    Co-evaluation              AM-PAC PT "6 Clicks" Mobility   Outcome Measure  Help needed turning from your back to your side while in a flat bed without using bedrails?: A Lot Help needed moving from lying on your back to sitting on the side of a flat bed without using bedrails?: Total Help needed moving to and from a bed to a chair (including a wheelchair)?: Total Help needed standing up from a chair using your arms (e.g., wheelchair or bedside chair)?: Total Help needed to walk in hospital room?: Total Help needed climbing 3-5 steps with a railing? : Total 6 Click Score: 7    End of Session Equipment Utilized During Treatment: Gait belt Activity Tolerance: Other (comment) (LImited due to assisted with BM cleaning) Patient left: in bed;with call bell/phone within reach;with bed alarm set;with restraints reapplied Nurse Communication: Mobility status PT Visit Diagnosis: Other abnormalities of gait and mobility (R26.89);Muscle weakness (generalized) (M62.81)     Time: 7829-5621 (only 2 units b/c out of room ~8 mins for chaplin to complete  power of attorney papers) PT Time Calculation (min) (ACUTE ONLY): 38 min  Charges:  $Therapeutic Activity: 23-37 mins                     Anise Salvo, PT Acute Rehab [facility] [phone]    Rayetta Humphrey 01/07/2023, 2:11 PM

## 2023-01-07 NOTE — TOC Benefit Eligibility Note (Signed)
Patient Advocate Encounter  Insurance verification completed.    The patient is currently admitted and upon discharge could be taking Eliquis 5 mg.  The current 30 day co-pay is $11.20.   The patient is currently admitted and upon discharge could be taking Xarelto 20 mg.  The current 30 day co-pay is $11.20.   The patient is insured through [employer] Medicare Part D   This test claim was processed through [facility]- copay amounts may vary at other pharmacies due to pharmacy/plan contracts, or as the patient moves through the different stages of their insurance plan.  Lissie Hinesley, CPHT Pharmacy Patient Advocate Specialist [facility] Patient Advocate Team Direct Number: [phone]  Fax: [phone]       

## 2023-01-07 NOTE — Progress Notes (Signed)
Patient refused CPAP for the night  

## 2023-01-07 NOTE — Progress Notes (Signed)
[facility]    Date of Admission:  12/24/2022   Total days of antibiotics 15          ID: Jim Ramirez is a 72 y.o. male with   Principal Problem:   Discitis of lumbar region Active Problems:   Diabetes mellitus type 2 in nonobese (HCC)   Dyslipidemia   Essential hypertension   New onset atrial fibrillation (HCC)   Sepsis (HCC)   BPH (benign prostatic hyperplasia)   Mood disorder (HCC)   MSSA bacteremia    Subjective: Afebrile, but having confusion overnight requiring mittens. Only mild pain to right hand/abscess  Medications:   acetaminophen  500 mg Oral TID   amiodarone  400 mg Oral BID   Followed by   Melene Muller ON 01/08/2023] amiodarone  200 mg Oral Daily   azelastine  1 spray Each Nare BID   Chlorhexidine Gluconate Cloth  6 each Topical Daily   enoxaparin (LOVENOX) injection  90 mg Subcutaneous Q12H   escitalopram  10 mg Oral Daily   ferrous sulfate  325 mg Oral Q breakfast   fluticasone  2 spray Each Nare Daily   furosemide  40 mg Intravenous BID   gabapentin  100 mg Oral BID   insulin aspart  0-15 Units Subcutaneous TID WC   insulin aspart  0-5 Units Subcutaneous QHS   insulin glargine-yfgn  15 Units Subcutaneous QHS   ketotifen  1 drop Left Eye BID   lidocaine  2 patch Transdermal Q24H   loratadine  10 mg Oral Daily   melatonin  3 mg Oral QHS   methocarbamol  500 mg Oral TID   pantoprazole  40 mg Oral BID   risperiDONE  0.5 mg Oral QHS   rosuvastatin  20 mg Oral Daily   sodium chloride flush  10-40 mL Intracatheter Q12H   sodium chloride flush  3 mL Intravenous Q12H   tamsulosin  0.4 mg Oral Daily    Objective: Vital signs in last 24 hours: Temp:  [98.6 F (37 C)-100 F (37.8 C)] 99.6 F (37.6 C) (06/11 0739) Pulse Rate:  [87-99] 99 (06/11 0739) Resp:  [20-23] 20 (06/11 0739) BP: (108-137)/(53-65) 118/61 (06/11 0739) SpO2:  [92 %-96 %] 95 % (06/11 0739)  Physical Exam  Constitutional: He is oriented to person, place,  and time. He appears well-developed and well-nourished. No distress. Injected left eye HENT:  Mouth/Throat: Oropharynx is clear and moist. No oropharyngeal exudate.  Cardiovascular: Normal rate, regular rhythm and normal heart sounds. Exam reveals no gallop and no friction rub.  No murmur heard.  Pulmonary/Chest: Effort normal and breath sounds normal. No respiratory distress. He has no wheezes.  Abdominal: Soft. Bowel sounds are normal. He exhibits no distension. There is no tenderness.  Lymphadenopathy:  He has no cervical adenopathy.  Neurological: He is alert and oriented to person, place, and time.  Skin: Skin is warm and dry. No rash noted. No erythema.  Psychiatric: He has a normal mood and affect. His behavior is normal.     Lab Results Recent Labs    01/06/23 0424 01/07/23 0324  WBC 9.4 8.9  HGB 9.7* 9.5*  HCT 31.4* 30.5*  NA 136 138  K 3.0* 3.5  CL 95* 100  CO2 31 30  BUN 12 17  CREATININE 0.77 1.00   Reviewed labs and micro IMPRESSION: 1. 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 There are 2 adjacent  abscesses extending proximally from the joint, the larger palmar to the joint and extending along the abductor pollicis muscle, and the smaller abscess dorsal to the index finger metacarpal. 2. Dorsal cellulitis in the hand. 3. Edema and low-grade enhancement tracking on fascia planes throughout the visualized portion of the hand suspicious for local inflammation and possibly fasciitis. 4. Torn scapholunate ligament. 5. Moderate effusion of the radiocarpal joint and along the midcarpal row dorsally.   Assessment/Plan: Disseminated MSSA infection = currently on nafcillin, currently at day 11, will switch to cefazolin after completing 14 days. Defer to hand surgery whether to offer I x D. From ID standpoint it would help decrease bacterial burden somewhat.  Has prolonged course of iv abtx followed by  suppressive therapy given burden of infection. Afebrile now for 3 days.  Continue with pt/ot as tolerated.  [facility] Pager: [phone]  01/07/2023, 1:18 PM

## 2023-01-07 NOTE — Progress Notes (Signed)
   Providing Compassionate, Quality Care - Together   Subjective: Patient reports his back "feels better."  Objective: Vital signs in last 24 hours: Temp:  [98.6 F (37 C)-100 F (37.8 C)] 99.6 F (37.6 C) (06/11 0739) Pulse Rate:  [87-99] 99 (06/11 0739) Resp:  [20-23] 20 (06/11 0739) BP: (108-137)/(53-65) 118/61 (06/11 0739) SpO2:  [92 %-96 %] 95 % (06/11 0739)  Intake/Output from previous day: 06/10 0701 - 06/11 0700 In: 582.5 [P.O.:100; IV Piggyback:482.5] Out: 1400 [Urine:1400] Intake/Output this shift: No intake/output data recorded.   Responds to voice, oriented to self, knows he has an infection PERRLA MAE, Mild generalized weakness likely secondary to deconditioning    Lab Results: Recent Labs    01/06/23 0424 01/07/23 0324  WBC 9.4 8.9  HGB 9.7* 9.5*  HCT 31.4* 30.5*  PLT 325 366   BMET Recent Labs    01/06/23 0424 01/07/23 0324  NA 136 138  K 3.0* 3.5  CL 95* 100  CO2 31 30  GLUCOSE 164* 136*  BUN 12 17  CREATININE 0.77 1.00  CALCIUM 7.9* 8.2*    Studies/Results: No results found.  Assessment/Plan: Patient admitted with significant back pain with radiation down BLE. He was septic with elevated troponin and a-fib with RVR. 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     LOS: 14 days    -Do not feel that patient's situation would be improved with back surgery. -Continue antibiotics per ID's recommendation.   Val Eagle, DNP, AGNP-C Nurse Practitioner  [facility] [address], [place], [place] [address] P: [phone]    F: [phone]  01/07/2023, 10:42 AM

## 2023-01-07 NOTE — Plan of Care (Signed)
  Problem: Health Behavior/Discharge Planning: Goal: Ability to manage health-related needs will improve Outcome: Progressing   Problem: Clinical Measurements: Goal: Ability to maintain clinical measurements within normal limits will improve Outcome: Progressing Goal: Diagnostic test results will improve Outcome: Progressing Goal: Respiratory complications will improve Outcome: Progressing Goal: Cardiovascular complication will be avoided Outcome: Progressing   Problem: Coping: Goal: Level of anxiety will decrease Outcome: Progressing   Problem: Elimination: Goal: Will not experience complications related to bowel motility Outcome: Progressing Goal: Will not experience complications related to urinary retention Outcome: Progressing   Problem: Pain Managment: Goal: General experience of comfort will improve Outcome: Progressing   Problem: Safety: Goal: Ability to remain free from injury will improve Outcome: Progressing   Problem: Skin Integrity: Goal: Risk for impaired skin integrity will decrease Outcome: Progressing

## 2023-01-07 NOTE — Progress Notes (Signed)
ANTICOAGULATION CONSULT NOTE Pharmacy Consult for heparin Indication: atrial fibrillation  No Known Allergies  Patient Measurements: Height: 6' (182.9 cm) Weight: 86.5 kg (190 lb 11.2 oz) IBW/kg (Calculated) : 77.6 Heparin Dosing Weight: 93kg  Vital Signs: Temp: 99.6 F (37.6 C) (06/11 0739) Temp Source: Oral (06/11 0739) BP: 118/61 (06/11 0739) Pulse Rate: 99 (06/11 0739)  Labs: Recent Labs    01/05/23 0515 01/06/23 0424 01/07/23 0324  HGB 9.4* 9.7* 9.5*  HCT 30.6* 31.4* 30.5*  PLT 331 325 366  CREATININE 0.71 0.77 1.00     Estimated Creatinine Clearance: 75.4 mL/min (by C-G formula based on SCr of 1 mg/dL).  Assessment: 72 y.o. male with new-onset AFib with RVR in the setting of MSSA bacteremia and lumbar spine osteomyelitis and pyogenic ventriculitis. Patient not taking any anticoagulation prior to admission. Pharmacy initially consulted to dose heparin infusion for AFib.  Then on 01/03/23 Heparin was changed to Enoxaparin treatment dosing after pharmacist discussion with MD due to concern for possible heparin resistance.     Continues on Enoxaparin 90 mg SQ q12h for new onset Afib.  Dose is ~ 1 mg/kg q12h. Weight 86.5 kg   Hgb 9s, Plt wnl - stable. Iron defic anemia, on  po iron supplement w/ bowel regimen prn.  No bleeding noted.  Scr stable in 7s> now 1.0   Goal of Therapy:  antiXa level:  0.6-1 unit/ml  peak level (4 hours after a dose) Monitor PLTC    Plan:  Continue Enoxaparin 90mg  [place] q12h  (~ 1mg /kg q12h) Monitor daily CBC and for s/sx of bleeding F/u plan to transition to oral [facility] Of note, copay cost of apixaban or rivaroxaban is $11.20/month Follow SCr, CBC  Noah Delaine, RPh Clinical Pharmacist 01/07/2023 1:15 PM   Please check AMION for all [facility] Pharmacy phone numbers After 10:00 PM, call Main Pharmacy [phone]

## 2023-01-07 NOTE — Progress Notes (Signed)
PROGRESS NOTE    Jim Ramirez  ZOX:[id number] DOB: 12-10-1950 DOA: 12/24/2022 PCP: [name], MD   Brief Narrative:  72 y.o. male past medical history of hypertension and diabetes initially presented to [facility] on 12/22/2022 for chronic back pain radiating both her legs, with some shortness of breath that started 1 week prior to admission CT scan of the abdomen pelvis or lumbar spine discitis and osteomyelitis, transferred to [facility] found to be bacteremic with staph aureus with necrosis of the lumbar spine, also in A-fib with RVR, neurology, ID and cardiology were consulted. MRI of the wrist was done that showed fluid collection and surgery was consulted who recommended conservative management. Despite IV antibiotics he continues to spike fevers.  MRI lumbar spine done on 6/7 showed worsening of infection/concerns of epidural abscess but neurosurgery recommending against any surgical intervention. Initial blood cultures at [facility] grew MSSA. He was started on and, surveillance cultures remain negative.  TEE from 6/4 does not show any vegetation but MRI does show small fluid collection of the rest and surgery recommended conservative management.  Infectious disease recommended IV nafcillin from negative cultures on 5/28.   Assessment & Plan:  Principal Problem:   Discitis of lumbar region Active Problems:   Diabetes mellitus type 2 in nonobese (HCC)   Dyslipidemia   Essential hypertension   New onset atrial fibrillation (HCC)   Sepsis (HCC)   BPH (benign prostatic hyperplasia)   Mood disorder (HCC)   MSSA bacteremia    Severe sepsis secondary to persistent vertebral osteonecrosis/infarct of L4-L-L5 with discitis/septic arthritis and multiple loculated fluid collection around psoas muscle MSSA bacteremia -Initial blood cultures are growing MSSA and was started on IV vancomycin, eventually surveillance cultures remain negative and patient has been on IV nafcillin with ID  recommendations.  Total of 6 weeks from negative culture on 5/20. -MRI lumbar spine shows lumbar discitis, osteomyelitis and epidural abscess-neurosurgery at this point is recommending any surgical intervention as it will not change any prognosis especially patient does not improve on IV antibiotics.   left wrist edema/concern for tenosynovitis: -Conservative management per orthopedic. MRI shows persistent and worsening abscess of the right wrist, [facility] was consulted, status post bedside aspiration 6/10 but not enough sample.  Grossly did not appear purulent per their documentation  Abnormal breath sounds - Continue Lasix twice daily.  Aggressive use of bronchodilators and IV/flutter.  Out of bed to chair   Osteonecrosis of L4-L5: -Pain control.  Not a surgical candidate   New onset atrial fibrillation: -Currently on amiodarone, Lovenox every 12 hours.  Iron deficiency anemia:  Iron supplements with bowel regimen   Hypokalemia:  As needed repletion   Acute kidney injury: Baseline creatinine 0.7, resolved.  Creatinine today 1.0   Acute thrombocytopenia: Resolved   Elevated LFTs and hyperbilirubinemia: Stable   Elevate troponin: -Likely demand ischemia.  Patient denies any chest pain.   BPH: -Continue Flomax.   Diabetes mellitus type 2 with hyperglycemia: -A1c of 8.1. Sliding scale and Accu-Chek.  Semglee 15 units at bedtime   Obstructive sleep apnea: -Continue CPAP at night.   Hyperlipidemia:  -continue statins.   Mood disorder: -Continue Lexapro.  Bedtime Risperdal   Moderate protein caloric malnutrition: -Noted.  Patient has already been seen by palliative care.  Currently DNR but continue full treatment Ongoing discussions by palliative regarding goals of care. Overall very poor prognosis     DVT prophylaxis: Heparin Code Status: DNR Family Communication:  None present at bedside.  Will  require long-term IV antibiotics.  Will discuss with TOC  regarding disposition   Pressure Injury 01/03/23 Buttocks Right;Left Stage 2 -  Partial thickness loss of dermis presenting as a shallow open injury with a red, pink wound bed without slough. 1x1 (Active)  01/03/23 2100  Location: Buttocks  Location Orientation: Right;Left  Staging: Stage 2 -  Partial thickness loss of dermis presenting as a shallow open injury with a red, pink wound bed without slough.  Wound Description (Comments): 1x1  Present on Admission: No     Diet Orders (From admission, onward)     Start     Ordered   01/06/23 1512  Diet Heart Room service appropriate? Yes; Fluid consistency: Thin  Diet effective now       Question Answer Comment  Room service appropriate? Yes   Fluid consistency: Thin      01/06/23 1511            Subjective: Seen at bedside, overall weak.   Examination: Constitutional: Not in acute distress Respiratory: diminishes at bases Cardiovascular: Normal sinus rhythm, no rubs Abdomen: Nontender nondistended good bowel sounds Musculoskeletal: No edema noted Skin: No rashes seen Neurologic: CN 2-12 grossly intact.  And nonfocal Psychiatric: poor judgment and insight. Alert and oriented x 3. Normal mood.     Objective: Vitals:   01/06/23 1740 01/06/23 2000 01/06/23 2343 01/07/23 0313  BP: 137/65 (!) 124/59 (!) 108/53 121/62  Pulse:  87    Resp: (!) 23 20 (!) 22 (!) 23  Temp: 98.6 F (37 C) 99.8 F (37.7 C) 100 F (37.8 C) 99.8 F (37.7 C)  TempSrc:  Oral Oral Oral  SpO2: 96% 92% 93% 95%  Weight:      Height:        Intake/Output Summary (Last 24 hours) at 01/07/2023 0728 Last data filed at 01/07/2023 0600 Gross per 24 hour  Intake 582.54 ml  Output 1400 ml  Net -817.46 ml   Filed Weights   12/31/22 1600 01/01/23 1500 01/05/23 0346  Weight: 93 kg 93 kg 86.5 kg    Scheduled Meds:  acetaminophen  500 mg Oral TID   amiodarone  400 mg Oral BID   Followed by   Melene Muller ON 01/08/2023] amiodarone  200 mg Oral Daily    azelastine  1 spray Each Nare BID   Chlorhexidine Gluconate Cloth  6 each Topical Daily   enoxaparin (LOVENOX) injection  90 mg Subcutaneous Q12H   escitalopram  10 mg Oral Daily   ferrous sulfate  325 mg Oral Q breakfast   fluticasone  2 spray Each Nare Daily   furosemide  40 mg Intravenous BID   gabapentin  100 mg Oral BID   insulin aspart  0-15 Units Subcutaneous TID WC   insulin aspart  0-5 Units Subcutaneous QHS   insulin glargine-yfgn  15 Units Subcutaneous QHS   ketotifen  1 drop Left Eye BID   lidocaine  2 patch Transdermal Q24H   loratadine  10 mg Oral Daily   magnesium oxide  800 mg Oral Once   melatonin  3 mg Oral QHS   methocarbamol  500 mg Oral TID   pantoprazole  40 mg Oral BID   potassium chloride  40 mEq Oral Once   risperiDONE  0.5 mg Oral QHS   rosuvastatin  20 mg Oral Daily   sodium chloride flush  10-40 mL Intracatheter Q12H   sodium chloride flush  3 mL Intravenous Q12H   tamsulosin  0.4 mg  Oral Daily   Continuous Infusions:  [START ON 01/11/2023]  ceFAZolin (ANCEF) IV     lactated ringers     nafcillin 12 g in sodium chloride 0.9 % 500 mL continuous infusion 20.8 mL/hr at 01/06/23 1818    Nutritional status     Body mass index is 25.86 kg/m.  Data Reviewed:   CBC: Recent Labs  Lab 01/03/23 1003 01/04/23 0430 01/05/23 0515 01/06/23 0424 01/07/23 0324  WBC 10.6* 8.5 7.6 9.4 8.9  HGB 10.1* 9.2* 9.4* 9.7* 9.5*  HCT 32.4* 30.0* 30.6* 31.4* 30.5*  MCV 83.5 83.6 82.7 83.7 83.6  PLT 264 286 331 325 366   Basic Metabolic Panel: Recent Labs  Lab 01/01/23 0252 01/02/23 0056 01/03/23 1003 01/04/23 0430 01/05/23 0515 01/06/23 0424 01/07/23 0324  NA 137   < > 139 140 139 136 138  K 3.4*   < > 2.7* 3.0* 2.8* 3.0* 3.5  CL 99   < > 98 99 99 95* 100  CO2 28   < > 32 32 32 31 30  GLUCOSE 227*   < > 237* 146* 126* 164* 136*  BUN 17   < > 13 15 13 12 17   CREATININE 0.71   < > 0.78 0.78 0.71 0.77 1.00  CALCIUM 7.4*   < > 7.6* 7.7* 7.8* 7.9* 8.2*   MG 1.9  --  2.0 1.9  --  1.7 1.8   < > = values in this interval not displayed.   GFR: Estimated Creatinine Clearance: 75.4 mL/min (by C-G formula based on SCr of 1 mg/dL). Liver Function Tests: Recent Labs  Lab 01/01/23 0252  AST 23  ALT 12  ALKPHOS 88  BILITOT 0.6  PROT 5.2*  ALBUMIN <1.5*   No results for input(s): "LIPASE", "AMYLASE" in the last 168 hours. No results for input(s): "AMMONIA" in the last 168 hours. Coagulation Profile: No results for input(s): "INR", "PROTIME" in the last 168 hours. Cardiac Enzymes: No results for input(s): "CKTOTAL", "CKMB", "CKMBINDEX", "TROPONINI" in the last 168 hours. BNP (last 3 results) No results for input(s): "PROBNP" in the last 8760 hours. HbA1C: No results for input(s): "HGBA1C" in the last 72 hours. CBG: Recent Labs  Lab 01/06/23 1104 01/06/23 1108 01/06/23 1738 01/06/23 2118 01/07/23 0617  GLUCAP 130* 123* 105* 84 126*   Lipid Profile: No results for input(s): "CHOL", "HDL", "LDLCALC", "TRIG", "CHOLHDL", "LDLDIRECT" in the last 72 hours. Thyroid Function Tests: No results for input(s): "TSH", "T4TOTAL", "FREET4", "T3FREE", "THYROIDAB" in the last 72 hours. Anemia Panel: Recent Labs    01/04/23 0850  TIBC 134*  IRON 9*   Sepsis Labs: No results for input(s): "PROCALCITON", "LATICACIDVEN" in the last 168 hours.  No results found for this or any previous visit (from the past 240 hour(s)).       Radiology Studies: No results found.         LOS: 14 days   Time spent= 35 mins    Lailah Marcelli Joline Maxcy, MD [facility]  If 7PM-7AM, please contact night-coverage  01/07/2023, 7:28 AM

## 2023-01-08 DIAGNOSIS — M86141 Other acute osteomyelitis, right hand: Secondary | ICD-10-CM | POA: Diagnosis not present

## 2023-01-08 DIAGNOSIS — G061 Intraspinal abscess and granuloma: Secondary | ICD-10-CM | POA: Diagnosis not present

## 2023-01-08 DIAGNOSIS — M4646 Discitis, unspecified, lumbar region: Secondary | ICD-10-CM | POA: Diagnosis not present

## 2023-01-08 DIAGNOSIS — L02511 Cutaneous abscess of right hand: Secondary | ICD-10-CM | POA: Diagnosis not present

## 2023-01-08 DIAGNOSIS — G0490 Encephalitis and encephalomyelitis, unspecified: Secondary | ICD-10-CM

## 2023-01-08 LAB — MAGNESIUM: Magnesium: 2 mg/dL (ref 1.7–2.4)

## 2023-01-08 LAB — BASIC METABOLIC PANEL
Anion gap: 13 (ref 5–15)
BUN: 26 mg/dL — ABNORMAL HIGH (ref 8–23)
CO2: 28 mmol/L (ref 22–32)
Calcium: 8 mg/dL — ABNORMAL LOW (ref 8.9–10.3)
Chloride: 98 mmol/L (ref 98–111)
Creatinine, Ser: 1.33 mg/dL — ABNORMAL HIGH (ref 0.61–1.24)
GFR, Estimated: 58 mL/min — ABNORMAL LOW (ref >60)
Glucose, Bld: 172 mg/dL — ABNORMAL HIGH (ref 70–99)
Potassium: 3.3 mmol/L — ABNORMAL LOW (ref 3.5–5.1)
Sodium: 139 mmol/L (ref 135–145)

## 2023-01-08 LAB — CBC
HCT: 28.8 % — ABNORMAL LOW (ref 39.0–52.0)
Hemoglobin: 8.8 g/dL — ABNORMAL LOW (ref 13.0–17.0)
MCH: 25.9 pg — ABNORMAL LOW (ref 26.0–34.0)
MCHC: 30.6 g/dL (ref 30.0–36.0)
MCV: 84.7 fL (ref 80.0–100.0)
Platelets: 317 10*3/uL (ref 150–400)
RBC: 3.4 MIL/uL — ABNORMAL LOW (ref 4.22–5.81)
RDW: 17.2 % — ABNORMAL HIGH (ref 11.5–15.5)
WBC: 7.2 10*3/uL (ref 4.0–10.5)
nRBC: 0 % (ref 0.0–0.2)

## 2023-01-08 LAB — GLUCOSE, CAPILLARY
Glucose-Capillary: 177 mg/dL — ABNORMAL HIGH (ref 70–99)
Glucose-Capillary: 172 mg/dL — ABNORMAL HIGH (ref 70–99)
Glucose-Capillary: 187 mg/dL — ABNORMAL HIGH (ref 70–99)
Glucose-Capillary: 238 mg/dL — ABNORMAL HIGH (ref 70–99)

## 2023-01-08 MED ORDER — POTASSIUM CHLORIDE CRYS ER 20 MEQ PO TBCR
40.0000 meq | EXTENDED_RELEASE_TABLET | Freq: Once | ORAL | Status: AC
  Administered 2023-01-08: 40 meq via ORAL
  Filled 2023-01-08: qty 2

## 2023-01-08 MED ORDER — SODIUM CHLORIDE 0.9 % IV SOLN: INTRAVENOUS | Status: AC

## 2023-01-08 MED ORDER — METHOCARBAMOL 500 MG PO TABS
500.0000 mg | ORAL_TABLET | Freq: Two times a day (BID) | ORAL | Status: DC
  Administered 2023-01-08 – 2023-01-25 (×35): 500 mg via ORAL
  Filled 2023-01-08 (×35): qty 1

## 2023-01-08 NOTE — Progress Notes (Signed)
This chaplain is present for F/U spiritual care. The Pt. responds to the call of his name as she enters the room and returns the chaplain's attempts to engage him in conversation. The chaplain understands from the Pt. he is making a little progress in therapy each day and the pain in his hand is decreasing.   The chaplain listens reflectively to the Pt. laughter as he talks about hiking [facility] and his sincerity as he expresses his hope to hike again. The Pt. is clear on its location and the people he has hiked with as he talks to the chaplain.   After the visit, the chaplain accepted the Pt. request for prayer and a "be safe" send off.  This chaplain is available for F/U spiritual care as needed.  Chaplain Stephanie Acre [phone]

## 2023-01-08 NOTE — Progress Notes (Signed)
[facility]    Date of Admission:  12/24/2022   Total days of antibiotics 16          ID: Jim Ramirez is a 72 y.o. male with  disseminated MSSA infection with  Principal Problem:   Discitis of lumbar region Active Problems:   Diabetes mellitus type 2 in nonobese (HCC)   Dyslipidemia   Essential hypertension   New onset atrial fibrillation (HCC)   Sepsis (HCC)   BPH (benign prostatic hyperplasia)   Mood disorder (HCC)   MSSA bacteremia    Subjective: Afebrile but increasingly solomnent. Still wearing mittens. Answer questions occasionally. Niece is in the room ([address] [place]) to give update  Medications:   acetaminophen  500 mg Oral TID   amiodarone  200 mg Oral Daily   azelastine  1 spray Each Nare BID   Chlorhexidine Gluconate Cloth  6 each Topical Daily   enoxaparin (LOVENOX) injection  90 mg Subcutaneous Q12H   escitalopram  10 mg Oral Daily   ferrous sulfate  325 mg Oral Q breakfast   fluticasone  2 spray Each Nare Daily   gabapentin  100 mg Oral BID   insulin aspart  0-15 Units Subcutaneous TID WC   insulin aspart  0-5 Units Subcutaneous QHS   insulin glargine-yfgn  15 Units Subcutaneous QHS   ketotifen  1 drop Left Eye BID   lidocaine  2 patch Transdermal Q24H   loratadine  10 mg Oral Daily   melatonin  3 mg Oral QHS   methocarbamol  500 mg Oral BID   pantoprazole  40 mg Oral BID   risperiDONE  0.5 mg Oral QHS   rosuvastatin  20 mg Oral Daily   sodium chloride flush  10-40 mL Intracatheter Q12H   sodium chloride flush  3 mL Intravenous Q12H   tamsulosin  0.4 mg Oral Daily    Objective: Vital signs in last 24 hours: Temp:  [97.9 F (36.6 C)-99.7 F (37.6 C)] 99.1 F (37.3 C) (06/12 0424) Pulse Rate:  [81-90] 81 (06/12 0001) Resp:  [20-25] 22 (06/12 0424) BP: (100-133)/(56-66) 115/56 (06/12 0424) SpO2:  [96 %-97 %] 96 % (06/12 0424)  Physical Exam  Constitutional: He is oriented to person, place. He appears well-developed  and well-nourished. No distress.  HENT:  Mouth/Throat: Oropharynx is clear and moist. No oropharyngeal exudate.  Cardiovascular: Normal rate, regular rhythm and normal heart sounds. Exam reveals no gallop and no friction rub.  No murmur heard.  Pulmonary/Chest: Effort normal and breath sounds normal. No respiratory distress. He has no wheezes.  Abdominal: Soft. Bowel sounds are normal. He exhibits no distension. There is no tenderness.  Lymphadenopathy:  He has no cervical adenopathy.  Neurological: He is alert and oriented to person, place, and time.  Skin: Skin is warm and dry. No rash noted. No erythema.     Lab Results Recent Labs    01/07/23 0324 01/08/23 0440  WBC 8.9 7.2  HGB 9.5* 8.8*  HCT 30.5* 28.8*  NA 138 139  K 3.5 3.3*  CL 100 98  CO2 30 28  BUN 17 26*  CREATININE 1.00 1.33*   Lab Results  Component Value Date   ESRSEDRATE 43 (H) 12/24/2022   Lab Results  Component Value Date   CRP 48.2 (H) 12/24/2022    Microbiology: 5/28 blood cx NGTD  Studies/Results: Lumbar MRI spine IMPRESSION: 1. 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 2.  Progressive superimposed changes concerning for infection with probable discitis at L4-5, with evidence for septic arthritis at the right L4-5 facet. Progressive associated paraspinous inflammatory changes, including multiple small loculated collections within the bilateral psoas muscles and right posterior paraspinous soft tissues as above. 3. Continued abnormal appearance of the ventral epidural space extending from L2-3 through L5-S1, mildly worsened from prior. Again, while this may in part be related to basivertebral venous thrombosis related to osteonecrosis, possible epidural abscess is difficult to exclude given the progressive inflammatory changes elsewhere within this region. 4. Underlying multilevel lumbar spondylosis with up to severe spinal stenosis at  L4-5. Right wrist mri IMPRESSION: 1. 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 There are 2 adjacent abscesses extending proximally from the joint, the larger palmar to the joint and extending along the abductor pollicis muscle, and the smaller abscess dorsal to the index finger metacarpal. 2. Dorsal cellulitis in the hand. 3. Edema and low-grade enhancement tracking on fascia planes throughout the visualized portion of the hand suspicious for local inflammation and possibly fasciitis. 4. Torn scapholunate ligament. 5. Moderate effusion of the radiocarpal joint and along the midcarpal row dorsally. 6. Degenerative findings in the carpus and along the middle and ring finger MCP joints. 7. Despite efforts by the technologist and patient, severe motion artifact is present on today's exam and could not be eliminated. This adversely affects exam sensitivity and specificity.   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   We will schedule video visit in 2 wks. Will sign off  I have personally spent 50 minutes involved in face-to-face and non-face-to-face activities for this patient on the day of the visit. Professional time spent includes the following activities: Preparing to see the patient (review of tests), Obtaining and/or reviewing separately obtained history (admission/discharge record), Performing a medically appropriate examination and/or evaluation ,  Ordering medications/tests/procedures, referring and communicating with other health care professionals, Documenting clinical information in the EMR, Independently interpreting results (not separately reported), Communicating results to the patient/family/caregiver, Counseling and educating the patient/family/caregiver and Care coordination (not separately reported).     Duke Salvia Drue Second MD MPH [facility] [phone]   [facility] Pager: [phone]  01/08/2023, 2:06 PM

## 2023-01-08 NOTE — Progress Notes (Signed)
Subjective: The patient is somnolent but easily arousable.  He is in no apparent distress.  He denies pain.  Objective: Vital signs in last 24 hours: Temp:  [97.9 F (36.6 C)-99.7 F (37.6 C)] 99.1 F (37.3 C) (06/12 0424) Pulse Rate:  [81-90] 81 (06/12 0001) Resp:  [20-25] 22 (06/12 0424) BP: (100-133)/(56-66) 115/56 (06/12 0424) SpO2:  [96 %-97 %] 96 % (06/12 0424) Estimated body mass index is 25.86 kg/m as calculated from the following:   Height as of this encounter: 6' (1.829 m).   Weight as of this encounter: 86.5 kg.   Intake/Output from previous day: 06/11 0701 - 06/12 0700 In: 1075.3 [P.O.:360; IV Piggyback:715.3] Out: 500 [Urine:500] Intake/Output this shift: No intake/output data recorded.  Physical exam patient is somnolent but arousable.  He is a bit dysarthric.  His lower extremity strength is normal.  Lab Results: Recent Labs    01/07/23 0324 01/08/23 0440  WBC 8.9 7.2  HGB 9.5* 8.8*  HCT 30.5* 28.8*  PLT 366 317   BMET Recent Labs    01/07/23 0324 01/08/23 0440  NA 138 139  K 3.5 3.3*  CL 100 98  CO2 30 28  GLUCOSE 136* 172*  BUN 17 26*  CREATININE 1.00 1.33*  CALCIUM 8.2* 8.0*    Studies/Results: No results found.  Assessment/Plan: Lumbar osteomyelitis, epidural abscess, etc.: Hopefully the antibiotics will work as he does not appear to be in good medical condition for surgery.  LOS: 15 days     Cristi Loron 01/08/2023, 8:43 AM     Patient ID: Jim Ramirez, male   DOB: 05-31-1951, 72 y.o.   MRN: [id number]

## 2023-01-08 NOTE — Progress Notes (Signed)
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 Rt wrist edema with tenosynovitis, new Afib with RVR. MRI brain negative for acute infarct, but with pyogenic ventriculitis. Bil UE superficial thrombosis. PMhx: ACDF, HTN, T2DM   OT comments  Pt progressing toward established OT goals. Pt with improved cognition and minimally conversational this session. Soiled on arrival requiring total A to get cleaned up; but able to roll with as little as min A +2. Following all commands with increased time. Pt able to perform bed mobility grossly with mod A. Pt with soft BP EOB and further transfers deferred. Able to tolerate EOB sitting ~5 mins with min A to min guard A. Will continue to follow and discharge plan remains appropriate.    Recommendations for follow up therapy are one component of a multi-disciplinary discharge planning process, led by the attending physician.  Recommendations may be updated based on patient status, additional functional criteria and insurance authorization.    Assistance Recommended at Discharge Frequent or constant Supervision/Assistance  Patient can return home with the following  A lot of help with bathing/dressing/bathroom;A lot of help with walking and/or transfers;Two people to help with walking and/or transfers;Assistance with cooking/housework;Assist for transportation;Direct supervision/assist for medications management;Help with stairs or ramp for entrance;Assistance with feeding   Equipment Recommendations  Other (comment) (defer)    Recommendations for Other Services      Precautions / Restrictions Precautions Precautions: Back;Fall Precaution Comments: pt educated for back precautiong and brace wear Required Braces or Orthoses: Spinal Brace Spinal Brace: Lumbar  corset;Applied in sitting position Restrictions Weight Bearing Restrictions: No       Mobility Bed Mobility Overal bed mobility: Needs Assistance Bed Mobility: Rolling, Sidelying to Sit, Sit to Sidelying Rolling: +2 for physical assistance, Min assist Sidelying to sit: HOB elevated, +2 for physical assistance, Mod assist     Sit to sidelying: Max assist, +2 for physical assistance General bed mobility comments: increased time and multimodal cues    Transfers                   General transfer comment: deferred due to soft BPs.     Balance Overall balance assessment: Needs assistance Sitting-balance support: Bilateral upper extremity supported Sitting balance-Leahy Scale: Poor Sitting balance - Comments: min guard A EOB                                   ADL either performed or assessed with clinical judgement   ADL Overall ADL's : Needs assistance/impaired     Grooming: Wash/dry face;Bed level;Set up                     Toilet Transfer Details (indicate cue type and reason): unable to safely transfer as pt with soft BPs           General ADL Comments: alert throughout session    Extremity/Trunk Assessment Upper Extremity Assessment Upper Extremity Assessment: Generalized weakness   Lower Extremity Assessment Lower Extremity Assessment: Defer to PT evaluation        Vision       Perception     Praxis      Cognition Arousal/Alertness: Awake/alert Behavior During Therapy: WFL for tasks assessed/performed Overall Cognitive Status: Impaired/Different from baseline Area  of Impairment: Memory, Problem solving                     Memory: Decreased short-term memory, Decreased recall of precautions       Problem Solving: Slow processing, Difficulty sequencing, Requires tactile cues, Requires verbal cues General Comments: cues for precautions and sequencing. Needs increased time to verbally express self.         Exercises Exercises: Other exercises Other Exercises Other Exercises: Forward punches 15x BUE    Shoulder Instructions       General Comments BP to 81/48 (59) at EOB, so transfer deferred. Returning to 122/60 (78) supine. Repositioned with HOB elevated. Educated regarding importance of HOB elevation in cardio status    Pertinent Vitals/ Pain       Pain Assessment Pain Assessment: Faces Faces Pain Scale: Hurts little more Pain Location: back with movement, none at rest Pain Descriptors / Indicators: Sore, Discomfort Pain Intervention(s): Limited activity within patient's tolerance, Monitored during session  Home Living                                          Prior Functioning/Environment              Frequency  Min 2X/week        Progress Toward Goals  OT Goals(current goals can now be found in the care plan section)  Progress towards OT goals: Progressing toward goals  Acute Rehab OT Goals Patient Stated Goal: get better OT Goal Formulation: With patient Time For Goal Achievement: 01/15/23 Potential to Achieve Goals: Good ADL Goals Pt Will Perform Grooming: with min assist;sitting Pt Will Perform Upper Body Bathing: with mod assist;with min assist;sitting Pt Will Perform Upper Body Dressing: with mod assist;with min assist;sitting Pt Will Transfer to Toilet: with max assist;with mod assist;stand pivot transfer;bedside commode Additional ADL Goal #1: Pt will tolerate AAROM and edema mgt of B UEs to increase functinal use for ADL tasks  Plan Discharge plan remains appropriate;Frequency remains appropriate    Co-evaluation                 AM-PAC OT "6 Clicks" Daily Activity     Outcome Measure   Help from another person eating meals?: Total Help from another person taking care of personal grooming?: A Lot Help from another person toileting, which includes using toliet, bedpan, or urinal?: A Lot Help from another person bathing  (including washing, rinsing, drying)?: A Lot Help from another person to put on and taking off regular upper body clothing?: A Lot Help from another person to put on and taking off regular lower body clothing?: A Lot 6 Click Score: 11    End of Session Equipment Utilized During Treatment: Back brace  OT Visit Diagnosis: Other abnormalities of gait and mobility (R26.89);Muscle weakness (generalized) (M62.81);Pain   Activity Tolerance Other (comment) (limited by soft BPs)   Patient Left in bed;with call bell/phone within reach;with bed alarm set;with restraints reapplied;with family/visitor present   Nurse Communication Mobility status        Time: 1435-1520 OT Time Calculation (min): 45 min  Charges: OT General Charges $OT Visit: 1 Visit OT Treatments $Self Care/Home Management : 8-22 mins $Therapeutic Activity: 8-22 mins $Therapeutic Exercise: 8-22 mins  Tyler Deis, OTR/L [facility] Acute Rehabilitation Office: [phone]   Myrla Halsted 01/08/2023, 5:44 PM

## 2023-01-08 NOTE — TOC Progression Note (Signed)
Transition of Care [facility]) - Progression Note    Patient Details  Name: DANNY YACKLEY MRN: [id number] Date of Birth: Oct 12, 1950  [facility]) CM/SW Contact  Harriet Masson, RN Phone Number: 01/08/2023, 10:07 AM  Clinical Narrative:     Left VM with daughter, Hassell Done, regarding LTAC. Awaiting return call.        Expected Discharge Plan and Services                                               Social Determinants of Health (SDOH) Interventions SDOH Screenings   Transportation Needs: No Transportation Needs (12/26/2022)  Utilities: Not At Risk (12/26/2022)  Tobacco Use: Medium Risk (01/01/2023)    Readmission Risk Interventions     No data to display

## 2023-01-08 NOTE — Progress Notes (Signed)
PROGRESS NOTE    Jim Ramirez  WUJ:[id number] DOB: 1951-06-03 DOA: 12/24/2022 PCP: [name], MD   Brief Narrative:  72 y.o. male past medical history of hypertension and diabetes initially presented to [facility] on 12/22/2022 for chronic back pain radiating both her legs, with some shortness of breath that started 1 week prior to admission CT scan of the abdomen pelvis or lumbar spine discitis and osteomyelitis, transferred to [facility] found to be bacteremic with staph aureus with necrosis of the lumbar spine, also in A-fib with RVR, neurology, ID and cardiology were consulted. MRI of the wrist was done that showed fluid collection and surgery was consulted who recommended conservative management. Despite IV antibiotics he continues to spike fevers.  MRI lumbar spine done on 6/7 showed worsening of infection/concerns of epidural abscess but neurosurgery recommending against any surgical intervention. Initial blood cultures at [facility] grew MSSA. He was started on and, surveillance cultures remain negative.  TEE from 6/4 does not show any vegetation but MRI does show small fluid collection of the rest and surgery recommended conservative management.  Infectious disease recommended IV nafcillin from negative cultures on 5/28.   Assessment & Plan:  Principal Problem:   Discitis of lumbar region Active Problems:   Diabetes mellitus type 2 in nonobese (HCC)   Dyslipidemia   Essential hypertension   New onset atrial fibrillation (HCC)   Sepsis (HCC)   BPH (benign prostatic hyperplasia)   Mood disorder (HCC)   MSSA bacteremia    Severe sepsis secondary to persistent vertebral osteonecrosis/infarct of L4-L-L5 with discitis/septic arthritis and multiple loculated fluid collection around psoas muscle MSSA bacteremia -Initial blood cultures are growing MSSA and was started on IV vancomycin, eventually surveillance cultures remain negative and patient has been on IV nafcillin with ID  recommendations.  IV antibiotics per infectious disease. -MRI lumbar spine shows lumbar discitis, osteomyelitis and epidural abscess-unfortunately due to poor prognosis no further surgical options from neurosurgery   left wrist edema/concern for tenosynovitis: -Conservative management per orthopedic. MRI shows persistent and worsening abscess of the right wrist, [facility] was consulted, status post bedside aspiration 6/10 but not enough sample.  Grossly did not appear purulent per their documentation   Acute kidney injury: Baseline creatinine 0.7, resolved.  Creatinine today 1.0 > 1.33.  Clinically getting dehydrated therefore we will give gentle fluids  Abnormal breath sounds - Holding Lasix due to AKI.  Aggressive use of bronchodilators and IV/flutter.  Out of bed to chair   Osteonecrosis of L4-L5: -Pain control.  Not a surgical candidate   New onset atrial fibrillation: -Currently on amiodarone, Lovenox every 12 hours.  Iron deficiency anemia:  Iron supplements with bowel regimen   Hypokalemia:  As needed repletion    Acute thrombocytopenia: Resolved   Elevated LFTs and hyperbilirubinemia: Stable   Elevate troponin: -Likely demand ischemia.  Patient denies any chest pain.   BPH: -Continue Flomax.   Diabetes mellitus type 2 with hyperglycemia: -A1c of 8.1. Sliding scale and Accu-Chek.  Semglee 15 units at bedtime   Obstructive sleep apnea: -Continue CPAP at night.   Hyperlipidemia:  -continue statins.   Mood disorder: -Continue Lexapro.  Bedtime Risperdal   Moderate protein caloric malnutrition: -Noted.  Patient has already been seen by palliative care.  Currently DNR but continue full treatment Ongoing discussions by palliative regarding goals of care. Overall very poor prognosis     DVT prophylaxis: Heparin Code Status: DNR Family Communication:  None present at bedside.  Will require long-term IV  antibiotics.  Will discuss with TOC regarding  disposition Will start having DOC explore options regarding long-term placement such as SNF/LTAC for IV antibiotics  Pressure Injury 01/03/23 Buttocks Right;Left Stage 2 -  Partial thickness loss of dermis presenting as a shallow open injury with a red, pink wound bed without slough. 1x1 (Active)  01/03/23 2100  Location: Buttocks  Location Orientation: Right;Left  Staging: Stage 2 -  Partial thickness loss of dermis presenting as a shallow open injury with a red, pink wound bed without slough.  Wound Description (Comments): 1x1  Present on Admission: No     Diet Orders (From admission, onward)     Start     Ordered   01/06/23 1512  Diet Heart Room service appropriate? Yes; Fluid consistency: Thin  Diet effective now       Question Answer Comment  Room service appropriate? Yes   Fluid consistency: Thin      01/06/23 1511            Subjective: Seen at bedside.  Denies any complaints.  Overall appears very weak.  Generally poor nutrition  Examination:  Constitutional: Not in acute distress, chronically ill and frail Respiratory: Mild bibasilar crackles Cardiovascular: Normal sinus rhythm, no rubs Abdomen: Nontender nondistended good bowel sounds Musculoskeletal: No edema noted Skin: No rashes seen Neurologic: CN 2-12 grossly intact.  And nonfocal Psychiatric: Normal judgment and insight. Alert and oriented x 3. Normal mood. Objective: Vitals:   01/07/23 1548 01/07/23 1932 01/08/23 0001 01/08/23 0424  BP: 133/63 111/62 100/66 (!) 115/56  Pulse: 90 84 81   Resp: (!) 25 20 20  (!) 22  Temp: 99.7 F (37.6 C) 99 F (37.2 C) 97.9 F (36.6 C) 99.1 F (37.3 C)  TempSrc: Oral Oral Oral Axillary  SpO2: 96% 97% 97% 96%  Weight:      Height:        Intake/Output Summary (Last 24 hours) at 01/08/2023 0742 Last data filed at 01/08/2023 0447 Gross per 24 hour  Intake 1075.28 ml  Output 500 ml  Net 575.28 ml   Filed Weights   12/31/22 1600 01/01/23 1500 01/05/23 0346   Weight: 93 kg 93 kg 86.5 kg    Scheduled Meds:  acetaminophen  500 mg Oral TID   amiodarone  200 mg Oral Daily   azelastine  1 spray Each Nare BID   Chlorhexidine Gluconate Cloth  6 each Topical Daily   enoxaparin (LOVENOX) injection  90 mg Subcutaneous Q12H   escitalopram  10 mg Oral Daily   ferrous sulfate  325 mg Oral Q breakfast   fluticasone  2 spray Each Nare Daily   gabapentin  100 mg Oral BID   insulin aspart  0-15 Units Subcutaneous TID WC   insulin aspart  0-5 Units Subcutaneous QHS   insulin glargine-yfgn  15 Units Subcutaneous QHS   ketotifen  1 drop Left Eye BID   lidocaine  2 patch Transdermal Q24H   loratadine  10 mg Oral Daily   melatonin  3 mg Oral QHS   methocarbamol  500 mg Oral BID   pantoprazole  40 mg Oral BID   risperiDONE  0.5 mg Oral QHS   rosuvastatin  20 mg Oral Daily   sodium chloride flush  10-40 mL Intracatheter Q12H   sodium chloride flush  3 mL Intravenous Q12H   tamsulosin  0.4 mg Oral Daily   Continuous Infusions:  [START ON 01/11/2023]  ceFAZolin (ANCEF) IV     lactated ringers  nafcillin 12 g in sodium chloride 0.9 % 500 mL continuous infusion 20.8 mL/hr at 01/08/23 0447    Nutritional status     Body mass index is 25.86 kg/m.  Data Reviewed:   CBC: Recent Labs  Lab 01/04/23 0430 01/05/23 0515 01/06/23 0424 01/07/23 0324 01/08/23 0440  WBC 8.5 7.6 9.4 8.9 7.2  HGB 9.2* 9.4* 9.7* 9.5* 8.8*  HCT 30.0* 30.6* 31.4* 30.5* 28.8*  MCV 83.6 82.7 83.7 83.6 84.7  PLT 286 331 325 366 317   Basic Metabolic Panel: Recent Labs  Lab 01/03/23 1003 01/04/23 0430 01/05/23 0515 01/06/23 0424 01/07/23 0324 01/08/23 0440  NA 139 140 139 136 138 139  K 2.7* 3.0* 2.8* 3.0* 3.5 3.3*  CL 98 99 99 95* 100 98  CO2 32 32 32 31 30 28   GLUCOSE 237* 146* 126* 164* 136* 172*  BUN 13 15 13 12 17  26*  CREATININE 0.78 0.78 0.71 0.77 1.00 1.33*  CALCIUM 7.6* 7.7* 7.8* 7.9* 8.2* 8.0*  MG 2.0 1.9  --  1.7 1.8 2.0   GFR: Estimated  Creatinine Clearance: 56.7 mL/min (A) (by C-G formula based on SCr of 1.33 mg/dL (H)). Liver Function Tests: No results for input(s): "AST", "ALT", "ALKPHOS", "BILITOT", "PROT", "ALBUMIN" in the last 168 hours.  No results for input(s): "LIPASE", "AMYLASE" in the last 168 hours. No results for input(s): "AMMONIA" in the last 168 hours. Coagulation Profile: No results for input(s): "INR", "PROTIME" in the last 168 hours. Cardiac Enzymes: No results for input(s): "CKTOTAL", "CKMB", "CKMBINDEX", "TROPONINI" in the last 168 hours. BNP (last 3 results) No results for input(s): "PROBNP" in the last 8760 hours. HbA1C: No results for input(s): "HGBA1C" in the last 72 hours. CBG: Recent Labs  Lab 01/07/23 0617 01/07/23 1155 01/07/23 1551 01/07/23 2120 01/08/23 0612  GLUCAP 126* 146* 302* 133* 177*   Lipid Profile: No results for input(s): "CHOL", "HDL", "LDLCALC", "TRIG", "CHOLHDL", "LDLDIRECT" in the last 72 hours. Thyroid Function Tests: No results for input(s): "TSH", "T4TOTAL", "FREET4", "T3FREE", "THYROIDAB" in the last 72 hours. Anemia Panel: No results for input(s): "VITAMINB12", "FOLATE", "FERRITIN", "TIBC", "IRON", "RETICCTPCT" in the last 72 hours.  Sepsis Labs: No results for input(s): "PROCALCITON", "LATICACIDVEN" in the last 168 hours.  No results found for this or any previous visit (from the past 240 hour(s)).       Radiology Studies: No results found.         LOS: 15 days   Time spent= 35 mins    Briseis Aguilera Joline Maxcy, MD [facility]  If 7PM-7AM, please contact night-coverage  01/08/2023, 7:42 AM

## 2023-01-08 NOTE — TOC Progression Note (Addendum)
Transition of Care [facility]) - Progression Note    Patient Details  Name: Jim Ramirez MRN: [id number] Date of Birth: 1951/03/17  [facility] CM/SW Contact  Harriet Masson, RN Phone Number: 01/08/2023, 11:23 AM  Clinical Narrative:     Spoke to patient regarding LTAC options. Patient is oriented to situation this morning and has chosen [facility] safety [facility]. This RNCM messaged Jennifer with [facility] to start auth.       Expected Discharge Plan and Services                                               Social Determinants of Health (SDOH) Interventions SDOH Screenings   Transportation Needs: No Transportation Needs (12/26/2022)  Utilities: Not At Risk (12/26/2022)  Tobacco Use: Medium Risk (01/01/2023)    Readmission Risk Interventions     No data to display

## 2023-01-09 DIAGNOSIS — A4101 Sepsis due to Methicillin susceptible Staphylococcus aureus: Principal | ICD-10-CM

## 2023-01-09 DIAGNOSIS — M4646 Discitis, unspecified, lumbar region: Secondary | ICD-10-CM | POA: Diagnosis not present

## 2023-01-09 LAB — CBC
HCT: 28.6 % — ABNORMAL LOW (ref 39.0–52.0)
Hemoglobin: 8.6 g/dL — ABNORMAL LOW (ref 13.0–17.0)
MCH: 25.7 pg — ABNORMAL LOW (ref 26.0–34.0)
MCHC: 30.1 g/dL (ref 30.0–36.0)
MCV: 85.6 fL (ref 80.0–100.0)
Platelets: 363 10*3/uL (ref 150–400)
RBC: 3.34 MIL/uL — ABNORMAL LOW (ref 4.22–5.81)
RDW: 17.1 % — ABNORMAL HIGH (ref 11.5–15.5)
WBC: 6.9 10*3/uL (ref 4.0–10.5)
nRBC: 0 % (ref 0.0–0.2)

## 2023-01-09 LAB — BASIC METABOLIC PANEL
Anion gap: 10 (ref 5–15)
BUN: 25 mg/dL — ABNORMAL HIGH (ref 8–23)
CO2: 27 mmol/L (ref 22–32)
Calcium: 8.1 mg/dL — ABNORMAL LOW (ref 8.9–10.3)
Chloride: 102 mmol/L (ref 98–111)
Creatinine, Ser: 1.24 mg/dL (ref 0.61–1.24)
GFR, Estimated: >60 mL/min (ref >60)
Glucose, Bld: 152 mg/dL — ABNORMAL HIGH (ref 70–99)
Potassium: 3.2 mmol/L — ABNORMAL LOW (ref 3.5–5.1)
Sodium: 139 mmol/L (ref 135–145)

## 2023-01-09 LAB — GLUCOSE, CAPILLARY
Glucose-Capillary: 131 mg/dL — ABNORMAL HIGH (ref 70–99)
Glucose-Capillary: 178 mg/dL — ABNORMAL HIGH (ref 70–99)
Glucose-Capillary: 283 mg/dL — ABNORMAL HIGH (ref 70–99)
Glucose-Capillary: 222 mg/dL — ABNORMAL HIGH (ref 70–99)
Glucose-Capillary: 81 mg/dL (ref 70–99)

## 2023-01-09 LAB — MAGNESIUM: Magnesium: 2.1 mg/dL (ref 1.7–2.4)

## 2023-01-09 NOTE — Progress Notes (Addendum)
Speech Language Pathology Treatment: Dysphagia  Patient Details Name: Jim Ramirez MRN: [id number] DOB: Mar 14, 1951 Today's Date: 01/09/2023 Time: 0912-0922 SLP Time Calculation (min) (ACUTE ONLY): 10 min  Assessment / Plan / Recommendation Clinical Impression  SLP followed up after pt was re-evaluated primarily due to cognitive based dysphagia with change in mental status last week. Initially had a FEES 6/1 with Dys 3, thin recommended. Today he followed commands, was conversent and asking about results of "heart test" although slightly sleepy. He masticated graham cracker without lower dentures without difficulty however therapist located lower denture plate, donned and he consumed another trial of regular texture. There was no oral residue and he took sips in between. Straw sips thin consistently without s/s aspiration. Pt's diet had been upgraded to regular and therapist and pt agree he is able to handle regular consistency. Continue thin liquids with pills whole in puree. No further ST is needed. Pt may have times when swallow is affected if mental status fluctuates. He is followed by Palliative care.    HPI HPI: Jim Ramirez is a 72 yo male who initially presented to [facility] on 5/26 with c/o chronic back pain with radiation down both legs, SOB x 1 week.  Found to have Staph aureus bacteremia with osteonecrosis of the lumbar spine.  Also had A-fib with RVR.  Neurosurgery, ID, cardiology following. CXR 5/30 with no acute findings.  MRI No evidence of acute or subacute infarct.Small foci of restricted diffusion with ADC correlate in the  bilateral occipital horns, concerning for pyogenic ventriculitis. Pt with PMH of HTN and DM. SLP performed a FEES 12/28/22 without penetration/aspiration and recommended Dys 3, thin. Followed up and discharged. ST reconsulted as pt confused 6/6 and question of aspiration. No recent CXR.      SLP Plan  All goals met;Discharge SLP treatment due to (comment)       Recommendations for follow up therapy are one component of a multi-disciplinary discharge planning process, led by the attending physician.  Recommendations may be updated based on patient status, additional functional criteria and insurance authorization.    Recommendations  Diet recommendations: Regular;Thin liquid Liquids provided via: Cup;Straw Medication Administration: Whole meds with puree Supervision: Staff to assist with self feeding;Full supervision/cueing for compensatory strategies Compensations: Slow rate;Small sips/bites;Minimize environmental distractions Postural Changes and/or Swallow Maneuvers: Seated upright 90 degrees                  Oral care BID   None Dysphagia, unspecified (R13.10)     All goals met;Discharge SLP treatment due to (comment)     Royce Macadamia  01/09/2023, 11:51 AM

## 2023-01-09 NOTE — Progress Notes (Signed)
PROGRESS NOTE    Jim Ramirez  XBJ:[id number] DOB: [birth year] DOA: 12/24/2022 PCP: [name], MD   Brief Narrative:  Patient is a 72 year old male with past medical history significant for hypertension and diabetes.  Patient initially presented to [facility] on 12/22/2022 with chronic back pain radiating both her legs, with some shortness of breath that started 1 week prior to admission CT scan of the abdomen pelvis or lumbar spine discitis and osteomyelitis, transferred to [facility] found to be bacteremic with staph aureus with necrosis of the lumbar spine, also in A-fib with RVR, neurology, ID and cardiology were consulted. MRI of the wrist was done that showed fluid collection and surgery was consulted who recommended conservative management. Despite IV antibiotics he continues to spike fevers.  MRI lumbar spine done on 6/7 showed worsening of infection/concerns of epidural abscess but neurosurgery recommending against any surgical intervention.  Initial blood cultures at [facility] grew MSSA.  He was started on antibiotics, surveillance cultures remain negative.  TEE from 6/4 does not show any vegetation but MRI does show small fluid collection of the rest and surgery recommended conservative management.  Infectious disease recommended IV nafcillin from negative cultures on 5/28 till 01/10/2023, then switched to cefazolin 2 g IV every 8 hourly for additional 6 weeks, thereafter cefadroxil 1000 mg twice daily for 10 months plus.    01/09/2023: Guarded prognosis as per ID.  Will have a low threshold to consult the palliative care team.  Continue IV antibiotics as outlined by infectious disease team (please see above).   Assessment & Plan:  Principal Problem:   Discitis of lumbar region Active Problems:   Diabetes mellitus type 2 in nonobese (HCC)   Dyslipidemia   Essential hypertension   New onset atrial fibrillation (HCC)   Sepsis (HCC)   BPH (benign prostatic hyperplasia)   Mood  disorder (HCC)   MSSA bacteremia    Severe sepsis secondary to persistent vertebral osteonecrosis/infarct of L4-L-L5 with discitis/septic arthritis and multiple loculated fluid collection around psoas muscle MSSA bacteremia -Initial blood cultures are growing MSSA and was started on IV vancomycin, eventually surveillance cultures remain negative and patient has been on IV nafcillin with ID recommendations.  IV antibiotics per infectious disease. -MRI lumbar spine shows lumbar discitis, osteomyelitis and epidural abscess-unfortunately due to poor prognosis no further surgical options from neurosurgery 01/09/2023: IV antibiotics as above.   left wrist edema/concern for tenosynovitis: -Conservative management per orthopedic. MRI shows persistent and worsening abscess of the right wrist, [facility] was consulted, status post bedside aspiration 6/10 but not enough sample.  Grossly did not appear purulent per their documentation 01/09/2023: IV antibiotics as above.  Acute kidney injury: Baseline creatinine 0.7, resolved.  Creatinine today 1.0 > 1.33.  Clinically getting dehydrated therefore we will give gentle fluids 01/09/2023:  BUN of 25 and serum creatinine of 1.24 today.  Continue to monitor renal function and electrolytes closely.   Osteonecrosis of L4-L5: -Pain control.  Not a surgical candidate   New onset atrial fibrillation: -Currently on amiodarone, Lovenox every 12 hours.  Iron deficiency anemia:  Iron supplements with bowel regimen   Hypokalemia:  As needed repletion    Acute thrombocytopenia: Resolved   Elevated LFTs and hyperbilirubinemia: Stable   Elevate troponin: -Likely demand ischemia.  Patient denies any chest pain.   BPH: -Continue Flomax.   Diabetes mellitus type 2 with hyperglycemia: -A1c of 8.1. Sliding scale and Accu-Chek.  Semglee 15 units at bedtime   Obstructive sleep apnea: -Continue CPAP  at night.   Hyperlipidemia:  -continue statins.   Mood  disorder: -Continue Lexapro.  Bedtime Risperdal   Moderate protein caloric malnutrition: -Noted.  Patient has already been seen by palliative care.  Currently DNR but continue full treatment Ongoing discussions by palliative regarding goals of care. Overall very poor prognosis     DVT prophylaxis: Heparin Code Status: DNR Family Communication:  None present at bedside.  Will require long-term IV antibiotics.  Will discuss with TOC regarding disposition Will start having DOC explore options regarding long-term placement such as SNF/LTAC for IV antibiotics  Pressure Injury 01/03/23 Buttocks Right;Left Stage 2 -  Partial thickness loss of dermis presenting as a shallow open injury with a red, pink wound bed without slough. 1x1 (Active)  01/03/23 2100  Location: Buttocks  Location Orientation: Right;Left  Staging: Stage 2 -  Partial thickness loss of dermis presenting as a shallow open injury with a red, pink wound bed without slough.  Wound Description (Comments): 1x1  Present on Admission: No     Diet Orders (From admission, onward)     Start     Ordered   01/06/23 1512  Diet Heart Room service appropriate? Yes; Fluid consistency: Thin  Diet effective now       Question Answer Comment  Room service appropriate? Yes   Fluid consistency: Thin      01/06/23 1511            Subjective: Seen at bedside.  Denies any complaints.  Overall appears very weak.  Generally poor nutrition  Examination:  Constitutional: Awake.  Not in any distress.  Chronically ill looking.   Lungs: Decreased air entry. Cardiovascular: S1-S2. Abdomen: Soft and nontender.  Neurologic: Awake and alert. Extremities: No leg edema. . Objective: Vitals:   01/09/23 0300 01/09/23 0400 01/09/23 0717 01/09/23 1101  BP: 134/66 131/62 126/63 (!) 120/49  Pulse: 75     Resp: 20 20 20 20   Temp: 97.8 F (36.6 C)  97.8 F (36.6 C) 97.8 F (36.6 C)  TempSrc: Oral  Oral Oral  SpO2: 98% 98% 97% 97%   Weight:      Height:        Intake/Output Summary (Last 24 hours) at 01/09/2023 1210 Last data filed at 01/09/2023 0500 Gross per 24 hour  Intake 534.39 ml  Output 1000 ml  Net -465.61 ml    Filed Weights   12/31/22 1600 01/01/23 1500 01/05/23 0346  Weight: 93 kg 93 kg 86.5 kg    Scheduled Meds:  acetaminophen  500 mg Oral TID   amiodarone  200 mg Oral Daily   azelastine  1 spray Each Nare BID   Chlorhexidine Gluconate Cloth  6 each Topical Daily   enoxaparin (LOVENOX) injection  90 mg Subcutaneous Q12H   escitalopram  10 mg Oral Daily   ferrous sulfate  325 mg Oral Q breakfast   fluticasone  2 spray Each Nare Daily   gabapentin  100 mg Oral BID   insulin aspart  0-15 Units Subcutaneous TID WC   insulin aspart  0-5 Units Subcutaneous QHS   insulin glargine-yfgn  15 Units Subcutaneous QHS   ketotifen  1 drop Left Eye BID   lidocaine  2 patch Transdermal Q24H   loratadine  10 mg Oral Daily   melatonin  3 mg Oral QHS   methocarbamol  500 mg Oral BID   pantoprazole  40 mg Oral BID   risperiDONE  0.5 mg Oral QHS   rosuvastatin  20  mg Oral Daily   sodium chloride flush  10-40 mL Intracatheter Q12H   sodium chloride flush  3 mL Intravenous Q12H   tamsulosin  0.4 mg Oral Daily   Continuous Infusions:  sodium chloride 75 mL/hr at 01/08/23 1130   [START ON 01/11/2023]  ceFAZolin (ANCEF) IV     lactated ringers     nafcillin 12 g in sodium chloride 0.9 % 500 mL continuous infusion 12 g (01/09/23 1132)    Nutritional status     Body mass index is 25.86 kg/m.  Data Reviewed:   CBC: Recent Labs  Lab 01/05/23 0515 01/06/23 0424 01/07/23 0324 01/08/23 0440 01/09/23 0610  WBC 7.6 9.4 8.9 7.2 6.9  HGB 9.4* 9.7* 9.5* 8.8* 8.6*  HCT 30.6* 31.4* 30.5* 28.8* 28.6*  MCV 82.7 83.7 83.6 84.7 85.6  PLT 331 325 366 317 363    Basic Metabolic Panel: Recent Labs  Lab 01/04/23 0430 01/05/23 0515 01/06/23 0424 01/07/23 0324 01/08/23 0440 01/09/23 0610  NA 140 139 136  138 139 139  K 3.0* 2.8* 3.0* 3.5 3.3* 3.2*  CL 99 99 95* 100 98 102  CO2 32 32 31 30 28 27   GLUCOSE 146* 126* 164* 136* 172* 152*  BUN 15 13 12 17  26* 25*  CREATININE 0.78 0.71 0.77 1.00 1.33* 1.24  CALCIUM 7.7* 7.8* 7.9* 8.2* 8.0* 8.1*  MG 1.9  --  1.7 1.8 2.0 2.1    GFR: Estimated Creatinine Clearance: 60.8 mL/min (by C-G formula based on SCr of 1.24 mg/dL). Liver Function Tests: No results for input(s): "AST", "ALT", "ALKPHOS", "BILITOT", "PROT", "ALBUMIN" in the last 168 hours.  No results for input(s): "LIPASE", "AMYLASE" in the last 168 hours. No results for input(s): "AMMONIA" in the last 168 hours. Coagulation Profile: No results for input(s): "INR", "PROTIME" in the last 168 hours. Cardiac Enzymes: No results for input(s): "CKTOTAL", "CKMB", "CKMBINDEX", "TROPONINI" in the last 168 hours. BNP (last 3 results) No results for input(s): "PROBNP" in the last 8760 hours. HbA1C: No results for input(s): "HGBA1C" in the last 72 hours. CBG: Recent Labs  Lab 01/08/23 1105 01/08/23 1608 01/08/23 2158 01/09/23 0653 01/09/23 1100  GLUCAP 172* 187* 238* 131* 178*    Lipid Profile: No results for input(s): "CHOL", "HDL", "LDLCALC", "TRIG", "CHOLHDL", "LDLDIRECT" in the last 72 hours. Thyroid Function Tests: No results for input(s): "TSH", "T4TOTAL", "FREET4", "T3FREE", "THYROIDAB" in the last 72 hours. Anemia Panel: No results for input(s): "VITAMINB12", "FOLATE", "FERRITIN", "TIBC", "IRON", "RETICCTPCT" in the last 72 hours.  Sepsis Labs: No results for input(s): "PROCALCITON", "LATICACIDVEN" in the last 168 hours.  No results found for this or any previous visit (from the past 240 hour(s)).       Radiology Studies: No results found.         LOS: 16 days   Time spent= 35 mins    Barnetta Chapel, MD [facility]  If 7PM-7AM, please contact night-coverage  01/09/2023, 12:10 PM

## 2023-01-09 NOTE — Progress Notes (Signed)
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 Rt wrist edema with tenosynovitis, new Afib with RVR. MRI brain negative for acute infarct, but with pyogenic ventriculitis. Bil UE superficial thrombosis. PMhx: ACDF, HTN, T2DM    PT Comments    Pt pleasant and eager to get OOB. Pt able to maintain 92-93% on RA and oriented x 4. Pt able to progress standing tolerance with Stedy this session and hope to attempt gait trials next session. Pt remains max assist for brace donning with education for precautions and safety. Will continue to follow. Encouraged OOB daily with nursing staff and Stedy.   HR 109    Recommendations for follow up therapy are one component of a multi-disciplinary discharge planning process, led by the attending physician.  Recommendations may be updated based on patient status, additional functional criteria and insurance authorization.  Follow Up Recommendations  Can patient physically be transported by private vehicle: No    Assistance Recommended at Discharge Frequent or constant Supervision/Assistance  Patient can return home with the following Assistance with cooking/housework;Assistance with feeding;Help with stairs or ramp for entrance;Assist for transportation;Direct supervision/assist for financial management;A lot of help with walking and/or transfers;A lot of help with bathing/dressing/bathroom   Equipment Recommendations  Hospital bed;Wheelchair (measurements PT);Wheelchair cushion (measurements PT);BSC/3in1    Recommendations for Other Services       Precautions / Restrictions Precautions Precautions: Back;Fall Required Braces or Orthoses: Spinal Brace Spinal Brace: Lumbar corset;Applied in sitting position     Mobility  Bed Mobility Overal bed mobility: Needs  Assistance Bed Mobility: Rolling, Sidelying to Sit Rolling: Min assist Sidelying to sit: Mod assist       General bed mobility comments: cues for sequence, use of rail and assist of pad to roll, assist to clear legs and elevate trunk to sitting    Transfers Overall transfer level: Needs assistance   Transfers: Sit to/from Stand Sit to Stand: Min assist           General transfer comment: pt able to stand with min assist from EOB to stedy and from stedy pads x 3 trials. pt max standing trial was 2 min with grossly10, 30 and 20 sec trials respectively. Pivot bed to chair via stedy Transfer via Lift Equipment: Stedy  Ambulation/Gait               General Gait Details: not yet able   Stairs             Wheelchair Mobility    Modified Rankin (Stroke Patients Only)       Balance Overall balance assessment: Needs assistance Sitting-balance support: Bilateral upper extremity supported Sitting balance-Leahy Scale: Poor Sitting balance - Comments: guarding EOB   Standing balance support: Bilateral upper extremity supported Standing balance-Leahy Scale: Poor Standing balance comment: Stedy bar for support in standing                            Cognition Arousal/Alertness: Awake/alert Behavior During Therapy: WFL for tasks assessed/performed Overall Cognitive Status: Impaired/Different from baseline Area of Impairment: Safety/judgement                     Memory: Decreased short-term memory   Safety/Judgement: Decreased awareness of deficits  Exercises General Exercises - Lower Extremity Long Arc Quad: AROM, Both, 20 reps, Seated Hip Flexion/Marching: AROM, Both, 20 reps, Seated    General Comments        Pertinent Vitals/Pain Pain Assessment Pain Assessment: No/denies pain    Home Living                          Prior Function            PT Goals (current goals can now be found in the care  plan section) Progress towards PT goals: Progressing toward goals    Frequency    Min 1X/week      PT Plan Current plan remains appropriate    Co-evaluation              AM-PAC PT "6 Clicks" Mobility   Outcome Measure  Help needed turning from your back to your side while in a flat bed without using bedrails?: A Little Help needed moving from lying on your back to sitting on the side of a flat bed without using bedrails?: A Lot Help needed moving to and from a bed to a chair (including a wheelchair)?: A Lot Help needed standing up from a chair using your arms (e.g., wheelchair or bedside chair)?: A Lot Help needed to walk in hospital room?: Total Help needed climbing 3-5 steps with a railing? : Total 6 Click Score: 11    End of Session Equipment Utilized During Treatment: Gait belt;Back brace Activity Tolerance: Patient tolerated treatment well Patient left: in chair;with call bell/phone within reach;with chair alarm set;with nursing/sitter in room Nurse Communication: Mobility status;Need for lift equipment PT Visit Diagnosis: Other abnormalities of gait and mobility (R26.89);Muscle weakness (generalized) (M62.81)     Time: 1610-9604 PT Time Calculation (min) (ACUTE ONLY): 24 min  Charges:  $Therapeutic Activity: 23-37 mins                     Merryl Hacker, PT Acute Rehabilitation Services Office: [phone]    Enedina Finner Shamiracle Gorden 01/09/2023, 12:29 PM

## 2023-01-09 NOTE — Progress Notes (Signed)
Palliative Medicine Progress Note   Patient Name: Jim Ramirez       Date: 01/09/2023 DOB: 07-Mar-1951  Age: 72 y.o. MRN#: [id number] Attending Physician: Barnetta Chapel, MD Primary Care Physician: [name], MD Admit Date: 12/24/2022   HPI/Patient Profile: 72 y.o. male  with past medical history of DM and HTN who initially presented to [facility] on 12/22/2022 with complaint of back pain with radiation down both legs.  He was found to have MSSA bacteremia and osteonecrosis of the lumbar spine.  He was also found to have new onset A-fib with RVR.  He was transferred to [facility] on 12/24/2022 for further management of severe sepsis.  Palliative Medicine was consulted for goals of care conversations.   Subjective: Chart reviewed and patient assessed at bedside. He is lethargic and unable to have a conversation at this time.   I spoke with his niece/Jessica by phone. I expressed concern that patient has not significantly improved overall and continues to be very weak and debilitated. I recommend having another family meeting, to which Shanda Bumps agrees is needed. She expresses concern that some family members are not understanding the seriousness of patient's current medical situation. She also shares that there is low health literacy among some family members, and recommends explaining things in a very simple manner.    Objective:  Physical Exam Vitals reviewed.  Constitutional:      General: He is not in acute distress.    Appearance: He is ill-appearing.  Pulmonary:     Effort: Pulmonary effort is normal.  Neurological:     Mental Status: He is lethargic.               Palliative Medicine Assessment & Plan   Assessment: Principal Problem:   Discitis of lumbar region Active  Problems:   Diabetes mellitus type 2 in nonobese [facility])   Dyslipidemia   Essential hypertension   New onset atrial fibrillation (HCC)   Sepsis (HCC)   BPH (benign prostatic hyperplasia)   Mood disorder (HCC)   MSSA bacteremia    Recommendations/Plan: Continue current interventions Family meeting Sunday 6/16 at 3:30 pm   Code Status: DNR   Prognosis:  Very guarded  Discharge Planning: To Be Determined   Thank you for allowing the Palliative Medicine Team to assist in the care of  this patient.   MDM - moderate   Merry Proud, NP   Please contact Palliative Medicine Team phone at [phone] for questions and concerns.  For individual providers, please see AMION.

## 2023-01-10 DIAGNOSIS — M4646 Discitis, unspecified, lumbar region: Secondary | ICD-10-CM | POA: Diagnosis not present

## 2023-01-10 LAB — BASIC METABOLIC PANEL
Anion gap: 8 (ref 5–15)
BUN: 22 mg/dL (ref 8–23)
CO2: 27 mmol/L (ref 22–32)
Calcium: 8.1 mg/dL — ABNORMAL LOW (ref 8.9–10.3)
Chloride: 104 mmol/L (ref 98–111)
Creatinine, Ser: 1.21 mg/dL (ref 0.61–1.24)
GFR, Estimated: >60 mL/min (ref >60)
Glucose, Bld: 104 mg/dL — ABNORMAL HIGH (ref 70–99)
Potassium: 2.8 mmol/L — ABNORMAL LOW (ref 3.5–5.1)
Sodium: 139 mmol/L (ref 135–145)

## 2023-01-10 LAB — GLUCOSE, CAPILLARY
Glucose-Capillary: 97 mg/dL (ref 70–99)
Glucose-Capillary: 118 mg/dL — ABNORMAL HIGH (ref 70–99)
Glucose-Capillary: 223 mg/dL — ABNORMAL HIGH (ref 70–99)
Glucose-Capillary: 292 mg/dL — ABNORMAL HIGH (ref 70–99)

## 2023-01-10 LAB — CBC
HCT: 28 % — ABNORMAL LOW (ref 39.0–52.0)
Hemoglobin: 8.4 g/dL — ABNORMAL LOW (ref 13.0–17.0)
MCH: 25.3 pg — ABNORMAL LOW (ref 26.0–34.0)
MCHC: 30 g/dL (ref 30.0–36.0)
MCV: 84.3 fL (ref 80.0–100.0)
Platelets: 316 10*3/uL (ref 150–400)
RBC: 3.32 MIL/uL — ABNORMAL LOW (ref 4.22–5.81)
RDW: 17.2 % — ABNORMAL HIGH (ref 11.5–15.5)
WBC: 7 10*3/uL (ref 4.0–10.5)
nRBC: 0 % (ref 0.0–0.2)

## 2023-01-10 LAB — MAGNESIUM: Magnesium: 2 mg/dL (ref 1.7–2.4)

## 2023-01-10 MED ORDER — ENSURE ENLIVE PO LIQD: 237.0000 mL | Freq: Two times a day (BID) | ORAL | Status: DC

## 2023-01-10 MED ORDER — POTASSIUM CHLORIDE 10 MEQ/100ML IV SOLN
10.0000 meq | INTRAVENOUS | Status: AC
  Administered 2023-01-10 (×3): 10 meq via INTRAVENOUS
  Filled 2023-01-10 (×3): qty 100

## 2023-01-10 MED ORDER — POTASSIUM CHLORIDE CRYS ER 20 MEQ PO TBCR
40.0000 meq | EXTENDED_RELEASE_TABLET | ORAL | Status: AC
  Administered 2023-01-10 (×2): 40 meq via ORAL
  Filled 2023-01-10 (×2): qty 2

## 2023-01-10 MED ORDER — POTASSIUM CHLORIDE 10 MEQ/100ML IV SOLN
10.0000 meq | Freq: Once | INTRAVENOUS | Status: AC
  Administered 2023-01-10: 10 meq via INTRAVENOUS
  Filled 2023-01-10: qty 100

## 2023-01-10 NOTE — Progress Notes (Signed)
   01/10/23 2000  BiPAP/CPAP/SIPAP  BiPAP/CPAP/SIPAP Pt Type Adult  Reason BIPAP/CPAP not in use Non-compliant (Pt refused, no machine in room.)

## 2023-01-10 NOTE — Progress Notes (Signed)
PROGRESS NOTE    Jim Ramirez  OZH:[id number] DOB: 02-04-1951 DOA: 12/24/2022 PCP: [name], MD   Brief Narrative:  Patient is a 72 year old male with past medical history significant for hypertension and diabetes.  Patient initially presented to [facility] on 12/22/2022 with chronic back pain radiating both her legs, with some shortness of breath that started 1 week prior to admission CT scan of the abdomen pelvis or lumbar spine discitis and osteomyelitis, transferred to [facility] found to be bacteremic with staph aureus with necrosis of the lumbar spine, also in A-fib with RVR, neurology, ID and cardiology were consulted. MRI of the wrist was done that showed fluid collection and surgery was consulted who recommended conservative management. Despite IV antibiotics he continues to spike fevers.  MRI lumbar spine done on 6/7 showed worsening of infection/concerns of epidural abscess but neurosurgery recommending against any surgical intervention.  Initial blood cultures at [facility] grew MSSA.  He was started on antibiotics, surveillance cultures remain negative.  TEE from 6/4 does not show any vegetation but MRI does show small fluid collection of the rest and surgery recommended conservative management.  Infectious disease recommended IV nafcillin from negative cultures on 5/28 till 01/10/2023, then switched to cefazolin 2 g IV every 8 hourly for additional 6 weeks, thereafter cefadroxil 1000 mg twice daily for 10 months plus.    01/09/2023: Guarded prognosis as per ID.  Will have a low threshold to consult the palliative care team.  Continue IV antibiotics as outlined by infectious disease team (please see above).  01/10/2023: Patient seen alongside patient's son and daughter-in-law.  Family was updated.  No new complaints.  Continue antibiotics as outlined by infectious disease team.  Family meeting is planned for Sunday, 01/12/2023.   Assessment & Plan:  Principal Problem:   Discitis of  lumbar region Active Problems:   Diabetes mellitus type 2 in nonobese (HCC)   Dyslipidemia   Essential hypertension   New onset atrial fibrillation (HCC)   Sepsis (HCC)   BPH (benign prostatic hyperplasia)   Mood disorder (HCC)   MSSA bacteremia    Severe sepsis secondary to persistent vertebral osteonecrosis/infarct of L4-L-L5 with discitis/septic arthritis and multiple loculated fluid collection around psoas muscle MSSA bacteremia -Initial blood cultures are growing MSSA and was started on IV vancomycin, eventually surveillance cultures remain negative and patient has been on IV nafcillin with ID recommendations.  IV antibiotics per infectious disease. -MRI lumbar spine shows lumbar discitis, osteomyelitis and epidural abscess-unfortunately due to poor prognosis no further surgical options from neurosurgery 01/10/2023: IV antibiotics as above.   left wrist edema/concern for tenosynovitis: -Conservative management per orthopedic. MRI shows persistent and worsening abscess of the right wrist, [facility] was consulted, status post bedside aspiration 6/10 but not enough sample.  Grossly did not appear purulent per their documentation 01/09/2023: IV antibiotics as above.  Acute kidney injury: Baseline creatinine 0.7, resolved.  Creatinine today 1.0 > 1.33.  Clinically getting dehydrated therefore we will give gentle fluids 01/09/2023:  BUN of 25 and serum creatinine of 1.24 today.  Continue to monitor renal function and electrolytes closely.   Osteonecrosis of L4-L5: -Pain control.  Not a surgical candidate   New onset atrial fibrillation: -Currently on amiodarone, Lovenox every 12 hours.  Iron deficiency anemia:  Iron supplements with bowel regimen   Hypokalemia:  As needed repletion    Acute thrombocytopenia: Resolved   Elevated LFTs and hyperbilirubinemia: Stable   Elevate troponin: -Likely demand ischemia.  Patient denies any chest  pain.   BPH: -Continue Flomax.    Diabetes mellitus type 2 with hyperglycemia: -A1c of 8.1. Sliding scale and Accu-Chek.  Semglee 15 units at bedtime   Obstructive sleep apnea: -Continue CPAP at night.   Hyperlipidemia:  -continue statins.   Mood disorder: -Continue Lexapro.  Bedtime Risperdal   Moderate protein caloric malnutrition: -Noted.  Patient has already been seen by palliative care.  Currently DNR but continue full treatment Ongoing discussions by palliative regarding goals of care. Overall very poor prognosis     DVT prophylaxis: Heparin Code Status: DNR Family Communication:  None present at bedside.  Will require long-term IV antibiotics.  Will discuss with TOC regarding disposition Will start having DOC explore options regarding long-term placement such as SNF/LTAC for IV antibiotics  Pressure Injury 01/03/23 Buttocks Right;Left Stage 2 -  Partial thickness loss of dermis presenting as a shallow open injury with a red, pink wound bed without slough. 1x1 (Active)  01/03/23 2100  Location: Buttocks  Location Orientation: Right;Left  Staging: Stage 2 -  Partial thickness loss of dermis presenting as a shallow open injury with a red, pink wound bed without slough.  Wound Description (Comments): 1x1  Present on Admission: No     Diet Orders (From admission, onward)     Start     Ordered   01/06/23 1512  Diet Heart Room service appropriate? Yes; Fluid consistency: Thin  Diet effective now       Question Answer Comment  Room service appropriate? Yes   Fluid consistency: Thin      01/06/23 1511            Subjective: Seen at bedside.  Denies any complaints.  Overall appears very weak.  Generally poor nutrition  Examination:  Constitutional: Awake.  Not in any distress.  Chronically ill looking.   Lungs: Decreased air entry. Cardiovascular: S1-S2. Abdomen: Soft and nontender.  Neurologic: Awake and alert. Extremities: No leg edema. . Objective: Vitals:   01/10/23 0459 01/10/23  0743 01/10/23 1100 01/10/23 1601  BP: 122/60 128/60 133/63 (!) 120/57  Pulse: 76 80 87 97  Resp: (!) 22 (!) 23 (!) 22 (!) 21  Temp: 98.1 F (36.7 C) 98.1 F (36.7 C) 98.4 F (36.9 C) 97.8 F (36.6 C)  TempSrc: Oral Oral Oral Oral  SpO2: 94% 93% 93% 90%  Weight:      Height:        Intake/Output Summary (Last 24 hours) at 01/10/2023 1748 Last data filed at 01/10/2023 0700 Gross per 24 hour  Intake 754.46 ml  Output 852 ml  Net -97.54 ml    Filed Weights   12/31/22 1600 01/01/23 1500 01/05/23 0346  Weight: 93 kg 93 kg 86.5 kg    Scheduled Meds:  acetaminophen  500 mg Oral TID   amiodarone  200 mg Oral Daily   azelastine  1 spray Each Nare BID   Chlorhexidine Gluconate Cloth  6 each Topical Daily   enoxaparin (LOVENOX) injection  90 mg Subcutaneous Q12H   escitalopram  10 mg Oral Daily   feeding supplement  237 mL Oral BID BM   ferrous sulfate  325 mg Oral Q breakfast   fluticasone  2 spray Each Nare Daily   gabapentin  100 mg Oral BID   insulin aspart  0-15 Units Subcutaneous TID WC   insulin aspart  0-5 Units Subcutaneous QHS   insulin glargine-yfgn  15 Units Subcutaneous QHS   ketotifen  1 drop Left Eye BID  lidocaine  2 patch Transdermal Q24H   loratadine  10 mg Oral Daily   melatonin  3 mg Oral QHS   methocarbamol  500 mg Oral BID   pantoprazole  40 mg Oral BID   potassium chloride  40 mEq Oral Q4H   risperiDONE  0.5 mg Oral QHS   rosuvastatin  20 mg Oral Daily   sodium chloride flush  10-40 mL Intracatheter Q12H   sodium chloride flush  3 mL Intravenous Q12H   tamsulosin  0.4 mg Oral Daily   Continuous Infusions:  [START ON 01/11/2023]  ceFAZolin (ANCEF) IV     lactated ringers     nafcillin 12 g in sodium chloride 0.9 % 500 mL continuous infusion 12 g (01/10/23 1156)   potassium chloride 10 mEq (01/10/23 1503)    Nutritional status     Body mass index is 25.86 kg/m.  Data Reviewed:   CBC: Recent Labs  Lab 01/06/23 0424 01/07/23 0324  01/08/23 0440 01/09/23 0610 01/10/23 0447  WBC 9.4 8.9 7.2 6.9 7.0  HGB 9.7* 9.5* 8.8* 8.6* 8.4*  HCT 31.4* 30.5* 28.8* 28.6* 28.0*  MCV 83.7 83.6 84.7 85.6 84.3  PLT 325 366 317 363 316    Basic Metabolic Panel: Recent Labs  Lab 01/06/23 0424 01/07/23 0324 01/08/23 0440 01/09/23 0610 01/10/23 0447  NA 136 138 139 139 139  K 3.0* 3.5 3.3* 3.2* 2.8*  CL 95* 100 98 102 104  CO2 31 30 28 27 27   GLUCOSE 164* 136* 172* 152* 104*  BUN 12 17 26* 25* 22  CREATININE 0.77 1.00 1.33* 1.24 1.21  CALCIUM 7.9* 8.2* 8.0* 8.1* 8.1*  MG 1.7 1.8 2.0 2.1 2.0    GFR: Estimated Creatinine Clearance: 62.4 mL/min (by C-G formula based on SCr of 1.21 mg/dL). Liver Function Tests: No results for input(s): "AST", "ALT", "ALKPHOS", "BILITOT", "PROT", "ALBUMIN" in the last 168 hours.  No results for input(s): "LIPASE", "AMYLASE" in the last 168 hours. No results for input(s): "AMMONIA" in the last 168 hours. Coagulation Profile: No results for input(s): "INR", "PROTIME" in the last 168 hours. Cardiac Enzymes: No results for input(s): "CKTOTAL", "CKMB", "CKMBINDEX", "TROPONINI" in the last 168 hours. BNP (last 3 results) No results for input(s): "PROBNP" in the last 8760 hours. HbA1C: No results for input(s): "HGBA1C" in the last 72 hours. CBG: Recent Labs  Lab 01/09/23 1552 01/09/23 2108 01/10/23 0615 01/10/23 1112 01/10/23 1559  GLUCAP 222* 81 97 118* 223*    Lipid Profile: No results for input(s): "CHOL", "HDL", "LDLCALC", "TRIG", "CHOLHDL", "LDLDIRECT" in the last 72 hours. Thyroid Function Tests: No results for input(s): "TSH", "T4TOTAL", "FREET4", "T3FREE", "THYROIDAB" in the last 72 hours. Anemia Panel: No results for input(s): "VITAMINB12", "FOLATE", "FERRITIN", "TIBC", "IRON", "RETICCTPCT" in the last 72 hours.  Sepsis Labs: No results for input(s): "PROCALCITON", "LATICACIDVEN" in the last 168 hours.  No results found for this or any previous visit (from the past 240  hour(s)).       Radiology Studies: No results found.         LOS: 17 days   Time spent= 35 mins    Barnetta Chapel, MD [facility]  If 7PM-7AM, please contact night-coverage  01/10/2023, 5:48 PM

## 2023-01-10 NOTE — Progress Notes (Signed)
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 Rt wrist edema with tenosynovitis, new Afib with RVR. MRI brain negative for acute infarct, but with pyogenic ventriculitis. Bil UE superficial thrombosis. PMhx: ACDF, HTN, T2DM   OT comments  Patient with fair progress toward patient focused goals.  Able to sit EOB for a few bites of breakfast and light grooming task.  Patient needing Min Guard for dynamic sitting task.  Deferred out of bed to the recliner due to abdominal discomfort.  Patient needing cues for back precautions, Max A to place and remove brace, and encouragement to take a few bites of food.  OT to continue efforts in the acute setting, and Patient will benefit from continued inpatient follow up therapy, <3 hours/day.  Patient requesting to lie back down, positioned for comfort and bed restraint reapplied.      Recommendations for follow up therapy are one component of a multi-disciplinary discharge planning process, led by the attending physician.  Recommendations may be updated based on patient status, additional functional criteria and insurance authorization.    Assistance Recommended at Discharge Frequent or constant Supervision/Assistance  Patient can return home with the following  A lot of help with bathing/dressing/bathroom;A lot of help with walking and/or transfers;Two people to help with walking and/or transfers;Assistance with cooking/housework;Assist for transportation;Direct supervision/assist for medications management;Help with stairs or ramp for entrance;Assistance with feeding   Equipment Recommendations       Recommendations for Other Services      Precautions / Restrictions Precautions Precautions: Back;Fall Precaution Booklet Issued: No Required Braces or Orthoses:  Spinal Brace Spinal Brace: Lumbar corset;Applied in sitting position Restrictions Weight Bearing Restrictions: No       Mobility Bed Mobility Overal bed mobility: Needs Assistance Bed Mobility: Sidelying to Sit, Sit to Sidelying   Sidelying to sit: Mod assist     Sit to sidelying: Max assist General bed mobility comments: cues for sequence, assist to clear legs and elevate trunk to sitting Patient Response: Cooperative  Transfers                   General transfer comment: deferred due to abdominal discomfort     Balance Overall balance assessment: Needs assistance Sitting-balance support: Bilateral upper extremity supported Sitting balance-Leahy Scale: Fair   Postural control: Posterior lean, Right lateral lean                                 ADL either performed or assessed with clinical judgement   ADL       Grooming: Wash/dry face;Sitting;Min guard           Upper Body Dressing : Maximal assistance;Sitting   Lower Body Dressing: Maximal assistance;Sitting/lateral leans                      Extremity/Trunk Assessment Upper Extremity Assessment Upper Extremity Assessment: Generalized weakness   Lower Extremity Assessment Lower Extremity Assessment: Defer to PT evaluation   Cervical / Trunk Assessment Cervical / Trunk Assessment: Other exceptions Cervical / Trunk Exceptions: back brace    Vision   Vision Assessment?: No apparent visual deficits   Perception Perception Perception: Not tested   Praxis Praxis Praxis: Not tested    Cognition Arousal/Alertness: Awake/alert Behavior During Therapy:  WFL for tasks assessed/performed Overall Cognitive Status: No family/caregiver present to determine baseline cognitive functioning                       Memory: Decreased recall of precautions, Decreased short-term memory       Problem Solving: Slow processing, Difficulty sequencing, Requires tactile cues,  Requires verbal cues          Exercises      Shoulder Instructions       General Comments      Pertinent Vitals/ Pain       Pain Assessment Pain Assessment: Faces Faces Pain Scale: Hurts a little bit Pain Location: back with movement Pain Descriptors / Indicators: Sore, Discomfort Pain Intervention(s): Monitored during session                                                          Frequency  Min 2X/week        Progress Toward Goals  OT Goals(current goals can now be found in the care plan section)  Progress towards OT goals: Progressing toward goals  Acute Rehab OT Goals OT Goal Formulation: With patient Time For Goal Achievement: 01/15/23 Potential to Achieve Goals: Good  Plan Discharge plan remains appropriate    Co-evaluation                 AM-PAC OT "6 Clicks" Daily Activity     Outcome Measure   Help from another person eating meals?: A Little Help from another person taking care of personal grooming?: A Little Help from another person toileting, which includes using toliet, bedpan, or urinal?: A Lot Help from another person bathing (including washing, rinsing, drying)?: A Lot Help from another person to put on and taking off regular upper body clothing?: A Lot Help from another person to put on and taking off regular lower body clothing?: A Lot 6 Click Score: 14    End of Session Equipment Utilized During Treatment: Back brace  OT Visit Diagnosis: Other abnormalities of gait and mobility (R26.89);Muscle weakness (generalized) (M62.81);Pain   Activity Tolerance Patient limited by pain   Patient Left in bed;with call bell/phone within reach;with restraints reapplied   Nurse Communication Mobility status        Time: 1032-1050 OT Time Calculation (min): 18 min  Charges: OT General Charges $OT Visit: 1 Visit OT Treatments $Self Care/Home Management : 8-22 mins  01/10/2023  RP, OTR/L  Acute  Rehabilitation Services  Office:  [phone]   Suzanna Obey 01/10/2023, 10:59 AM

## 2023-01-10 NOTE — Progress Notes (Signed)
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 Rt wrist edema with tenosynovitis, new Afib with RVR. MRI brain negative for acute infarct, but with pyogenic ventriculitis. Bil UE superficial thrombosis. PMhx: ACDF, HTN, T2DM    PT Comments    Pt received in supine, agreeable to therapy session and with good participation and tolerance for transfer training. Pt limited in standing tolerance due to posterior lean and bowel incontinence, RN/NT notified and NT entering room at end of session to assist pt with clean-up. Pt needing up to modA for bed mobility via log roll and up to maxA for sit<>stand and up to totalA for lateral step toward his L side at EOB x1 attempt, pt buckling and returned to supine via log roll for assist when pt noted to be incontinent. Pt continues to benefit from PT services to progress toward functional mobility goals.    Recommendations for follow up therapy are one component of a multi-disciplinary discharge planning process, led by the attending physician.  Recommendations may be updated based on patient status, additional functional criteria and insurance authorization.  Follow Up Recommendations  Can patient physically be transported by private vehicle: No    Assistance Recommended at Discharge Frequent or constant Supervision/Assistance  Patient can return home with the following Assistance with cooking/housework;Assistance with feeding;Help with stairs or ramp for entrance;Assist for transportation;Direct supervision/assist for financial management;A lot of help with bathing/dressing/bathroom;Two people to help with walking and/or transfers   Equipment Recommendations  Hospital bed;Wheelchair (measurements PT);Wheelchair cushion (measurements PT);BSC/3in1    Recommendations for  Other Services       Precautions / Restrictions Precautions Precautions: Back;Fall Precaution Booklet Issued: No Required Braces or Orthoses: Spinal Brace Spinal Brace: Lumbar corset;Applied in sitting position Restrictions Weight Bearing Restrictions: No     Mobility  Bed Mobility Overal bed mobility: Needs Assistance Bed Mobility: Sidelying to Sit, Sit to Sidelying, Rolling Rolling: Min assist Sidelying to sit: Mod assist     Sit to sidelying: Mod assist General bed mobility comments: cues for sequence, assist to clear legs and elevate trunk to sitting, and BLE assist for return to supine via log roll as well. Good effort this date.    Transfers Overall transfer level: Needs assistance Equipment used: Rolling walker (2 wheels) Transfers: Sit to/from Stand Sit to Stand: Mod assist, From elevated surface, Max assist          Lateral/Scoot Transfers: Max assist General transfer comment: from EOB<>RW x2 trials, pt stood on 1 of 2 trials. Single sidestep toward his L side in RW but very unstable with attempt to deweight LLE for stepping toward his R side, and pt needing maxA for stand>sit safely at EOB. Poor propulsion with attempts at lateral seated scooting toward HOB even with maxA, mostly due to sequencing/poor technique    Ambulation/Gait                   Stairs             Wheelchair Mobility    Modified Rankin (Stroke Patients Only)       Balance Overall balance assessment: Needs assistance Sitting-balance support: Bilateral upper extremity supported Sitting balance-Leahy Scale: Fair     Standing balance support: Bilateral upper extremity supported Standing balance-Leahy Scale: Poor Standing balance comment: +1-2 maxA for dynamic standing, +1 maxA for static  standing at RW; posterior lean                            Cognition Arousal/Alertness: Awake/alert Behavior During Therapy: WFL for tasks assessed/performed Overall  Cognitive Status: No family/caregiver present to determine baseline cognitive functioning                       Memory: Decreased recall of precautions, Decreased short-term memory       Problem Solving: Slow processing, Difficulty sequencing, Requires tactile cues, Requires verbal cues General Comments: cues for precautions and sequencing. Pt had difficulty sequencing lateral scooting and needs dense safety/sequencing cues for standing transfers as well.        Exercises General Exercises - Lower Extremity Ankle Circles/Pumps: AROM, Both, 10 reps, Supine Long Arc Quad: AROM, Both, 10 reps, Seated    General Comments General comments (skin integrity, edema, etc.): BP WFL per monitor, HR elevated to mid-120's with exertional tasks; SpO2 WFL on RA when signal achieved (poor signal when gripping RW and DOE 2/4)      Pertinent Vitals/Pain Pain Assessment Pain Assessment: Faces Faces Pain Scale: Hurts little more Pain Location: B hands (pt pointing to L and R MCP ~3-4th digit region) Pain Descriptors / Indicators: Sore, Discomfort Pain Intervention(s): Limited activity within patient's tolerance, Monitored during session, Repositioned, Other (comment) (MD/RN notified as previously MD had concern for infection only in one hand)    Home Living                          Prior Function            PT Goals (current goals can now be found in the care plan section) Acute Rehab PT Goals Patient Stated Goal: return to walking PT Goal Formulation: With patient Time For Goal Achievement: 01/14/23 Progress towards PT goals: Progressing toward goals    Frequency    Min 1X/week      PT Plan Current plan remains appropriate    Co-evaluation              AM-PAC PT "6 Clicks" Mobility   Outcome Measure  Help needed turning from your back to your side while in a flat bed without using bedrails?: A Little Help needed moving from lying on your back to sitting  on the side of a flat bed without using bedrails?: A Lot Help needed moving to and from a bed to a chair (including a wheelchair)?: A Lot Help needed standing up from a chair using your arms (e.g., wheelchair or bedside chair)?: A Lot Help needed to walk in hospital room?: Total Help needed climbing 3-5 steps with a railing? : Total 6 Click Score: 11    End of Session Equipment Utilized During Treatment: Gait belt;Back brace Activity Tolerance: Patient tolerated treatment well;Treatment limited secondary to medical complications (Comment);Other (comment) (frequent loose stools limiting OOB mobility) Patient left: in bed;with bed alarm set;with call bell/phone within reach;Other (comment) (heels floated, NT entering room to assist him with hygiene due to loose stools) Nurse Communication: Mobility status;Other (comment) (B hand pain (previously was only in one hand), pt needing bed bath due to continued incontinence) PT Visit Diagnosis: Other abnormalities of gait and mobility (R26.89);Muscle weakness (generalized) (M62.81)     Time: 1610-9604 PT Time Calculation (min) (ACUTE ONLY): 15 min  Charges:  $Therapeutic Activity: 8-22 mins  Florina Ou., PTA Acute Rehabilitation Services Secure Chat Preferred 9a-5:30pm Office: [phone]    Dorathy Kinsman [facility] 01/10/2023, 6:44 PM

## 2023-01-10 NOTE — Progress Notes (Signed)
ANTICOAGULATION CONSULT NOTE Pharmacy Consult for  Enoxaparin Indication: atrial fibrillation  No Known Allergies  Patient Measurements: Height: 6' (182.9 cm) Weight: 86.5 kg (190 lb 11.2 oz) IBW/kg (Calculated) : 77.6 Heparin Dosing Weight: 93kg  Vital Signs: Temp: 98.4 F (36.9 C) (06/14 1100) Temp Source: Oral (06/14 1100) BP: 133/63 (06/14 1100) Pulse Rate: 87 (06/14 1100)  Labs: Recent Labs    01/08/23 0440 01/09/23 0610 01/10/23 0447  HGB 8.8* 8.6* 8.4*  HCT 28.8* 28.6* 28.0*  PLT 317 363 316  CREATININE 1.33* 1.24 1.21     Estimated Creatinine Clearance: 62.4 mL/min (by C-G formula based on SCr of 1.21 mg/dL).  Assessment: 72 y.o. male with new-onset AFib with RVR in the setting of MSSA bacteremia and lumbar spine osteomyelitis and pyogenic ventriculitis. Patient not taking any anticoagulation prior to admission. Pharmacy initially consulted to dose heparin infusion for AFib.  Then on 01/03/23 Heparin was changed to Enoxaparin treatment dosing after pharmacist discussion with MD due to concern for possible heparin resistance.     Continues on Enoxaparin 90 mg SQ q12h for new onset Afib.  Dose is ~ 1 mg/kg q12h. Weight 86.5 kg  Hgb 9s, Plt wnl - stable in 300s. Iron defic anemia, on po iron supplement daily w/ bowel regimen as needed.  12/30/22 Vasc US BUE duplex:  bilateral UE acute superficial thrombus;No evidence of DVT.  Renal: AKI, F/u SCr trending down , 1.21 today.  CrCl estimated 62.4 ml/hr.  Current enoxaparin dose is appropriate.   Follow up on plan for transition to oral Anticoagulant .  30 day cost of apixaban or rivaroxaban is $11.20/month     Goal of Therapy:  antiXa level:  0.6-1 unit/ml  peak level (4 hours after a dose) Monitor PLTC    Plan:  Continue Enoxaparin 90mg  [name] q12h  (~ 1mg /kg q12h) Monitor  CBC and for s/sx of bleeding F/u plan to transition to oral [facility] Of note, copay cost of apixaban or rivaroxaban is $11.20/month Follow SCr,  CBC  Jim Ramirez, RPh Clinical Pharmacist 01/10/2023 12:29 PM   Please check AMION for all [facility] Pharmacy phone numbers After 10:00 PM, call Main Pharmacy [phone]

## 2023-01-10 NOTE — Progress Notes (Signed)
   Providing Compassionate, Quality Care - Together   Subjective: Patient is now on airborne precautions. No changes with regard to his back.  Objective: Vital signs in last 24 hours: Temp:  [97.8 F (36.6 C)-98.2 F (36.8 C)] 98.1 F (36.7 C) (06/14 0743) Pulse Rate:  [76-109] 80 (06/14 0743) Resp:  [19-25] 23 (06/14 0743) BP: (117-128)/(56-64) 128/60 (06/14 0743) SpO2:  [91 %-95 %] 93 % (06/14 0743)  Intake/Output from previous day: 06/13 0701 - 06/14 0700 In: 994.5 [P.O.:240; IV Piggyback:754.5] Out: 852 [Urine:850; Stool:2] Intake/Output this shift: No intake/output data recorded.  By report: Alert and oriented x 4 PERRLA MAE, Mild generalized weakness likely secondary to deconditioning  Lab Results: Recent Labs    01/09/23 0610 01/10/23 0447  WBC 6.9 7.0  HGB 8.6* 8.4*  HCT 28.6* 28.0*  PLT 363 316   BMET Recent Labs    01/09/23 0610 01/10/23 0447  NA 139 139  K 3.2* 2.8*  CL 102 104  CO2 27 27  GLUCOSE 152* 104*  BUN 25* 22  CREATININE 1.24 1.21  CALCIUM 8.1* 8.1*    Studies/Results: No results found.  Assessment/Plan: Patient admitted with significant back pain with radiation down BLE. He was septic with elevated troponin and a-fib with RVR. 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    LOS: 17 days     -Do not feel that patient's situation would be improved with back surgery. -Continue antibiotics per ID's recommendation.    Val Eagle, DNP, AGNP-C Nurse Practitioner  [facility] [address], [address], [place], [place] [address] P: [phone]    F: [phone]  01/10/2023, 11:03 AM

## 2023-01-10 NOTE — Plan of Care (Signed)
  Problem: Education: Goal: Knowledge of General Education information will improve Description: Including pain rating scale, medication(s)/side effects and non-pharmacologic comfort measures Outcome: Progressing   Problem: Health Behavior/Discharge Planning: Goal: Ability to manage health-related needs will improve Outcome: Progressing   Problem: Clinical Measurements: Goal: Ability to maintain clinical measurements within normal limits will improve Outcome: Progressing Goal: Diagnostic test results will improve Outcome: Progressing Goal: Respiratory complications will improve Outcome: Progressing   Problem: Activity: Goal: Risk for activity intolerance will decrease Outcome: Progressing   Problem: Nutrition: Goal: Adequate nutrition will be maintained Outcome: Progressing   Problem: Pain Managment: Goal: General experience of comfort will improve Outcome: Progressing   Problem: Coping: Goal: Ability to adjust to condition or change in health will improve Outcome: Progressing   Problem: Fluid Volume: Goal: Ability to maintain a balanced intake and output will improve Outcome: Progressing   Problem: Metabolic: Goal: Ability to maintain appropriate glucose levels will improve Outcome: Progressing   Problem: Nutritional: Goal: Maintenance of adequate nutrition will improve Outcome: Progressing Goal: Progress toward achieving an optimal weight will improve Outcome: Progressing

## 2023-01-11 DIAGNOSIS — M4646 Discitis, unspecified, lumbar region: Secondary | ICD-10-CM | POA: Diagnosis not present

## 2023-01-11 LAB — URINALYSIS, ROUTINE W REFLEX MICROSCOPIC
Bilirubin Urine: NEGATIVE
Glucose, UA: 150 mg/dL — AB
Ketones, ur: NEGATIVE mg/dL
Leukocytes,Ua: NEGATIVE
Nitrite: NEGATIVE
Protein, ur: NEGATIVE mg/dL
Specific Gravity, Urine: 1.016 (ref 1.005–1.030)
pH: 5 (ref 5.0–8.0)

## 2023-01-11 LAB — RENAL FUNCTION PANEL
Albumin: <1.5 g/dL — ABNORMAL LOW (ref 3.5–5.0)
Anion gap: 12 (ref 5–15)
BUN: 23 mg/dL (ref 8–23)
CO2: 25 mmol/L (ref 22–32)
Calcium: 8.4 mg/dL — ABNORMAL LOW (ref 8.9–10.3)
Chloride: 102 mmol/L (ref 98–111)
Creatinine, Ser: 1.26 mg/dL — ABNORMAL HIGH (ref 0.61–1.24)
GFR, Estimated: >60 mL/min (ref >60)
Glucose, Bld: 187 mg/dL — ABNORMAL HIGH (ref 70–99)
Phosphorus: 4.1 mg/dL (ref 2.5–4.6)
Potassium: 3.8 mmol/L (ref 3.5–5.1)
Sodium: 139 mmol/L (ref 135–145)

## 2023-01-11 LAB — CBC
HCT: 28.5 % — ABNORMAL LOW (ref 39.0–52.0)
Hemoglobin: 8.6 g/dL — ABNORMAL LOW (ref 13.0–17.0)
MCH: 25.8 pg — ABNORMAL LOW (ref 26.0–34.0)
MCHC: 30.2 g/dL (ref 30.0–36.0)
MCV: 85.6 fL (ref 80.0–100.0)
Platelets: 308 10*3/uL (ref 150–400)
RBC: 3.33 MIL/uL — ABNORMAL LOW (ref 4.22–5.81)
RDW: 17.2 % — ABNORMAL HIGH (ref 11.5–15.5)
WBC: 8.7 10*3/uL (ref 4.0–10.5)
nRBC: 0 % (ref 0.0–0.2)

## 2023-01-11 LAB — GLUCOSE, CAPILLARY
Glucose-Capillary: 175 mg/dL — ABNORMAL HIGH (ref 70–99)
Glucose-Capillary: 186 mg/dL — ABNORMAL HIGH (ref 70–99)
Glucose-Capillary: 174 mg/dL — ABNORMAL HIGH (ref 70–99)
Glucose-Capillary: 156 mg/dL — ABNORMAL HIGH (ref 70–99)

## 2023-01-11 LAB — MAGNESIUM: Magnesium: 2 mg/dL (ref 1.7–2.4)

## 2023-01-11 LAB — SODIUM, URINE, RANDOM: Sodium, Ur: <10 mmol/L

## 2023-01-11 NOTE — Progress Notes (Signed)
PROGRESS NOTE    Jim Ramirez  ZOX:[id number] DOB: 1951-01-06 DOA: 12/24/2022 PCP: [name], MD   Brief Narrative:  Patient is a 72 year old male with past medical history significant for hypertension and diabetes.  Patient initially presented to [facility] on 12/22/2022 with chronic back pain radiating both her legs, with some shortness of breath that started 1 week prior to admission CT scan of the abdomen pelvis or lumbar spine discitis and osteomyelitis, transferred to [facility] found to be bacteremic with staph aureus with necrosis of the lumbar spine, also in A-fib with RVR, neurology, ID and cardiology were consulted. MRI of the wrist was done that showed fluid collection and surgery was consulted who recommended conservative management. Despite IV antibiotics he continues to spike fevers.  MRI lumbar spine done on 6/7 showed worsening of infection/concerns of epidural abscess but neurosurgery recommending against any surgical intervention.  Initial blood cultures at [facility] grew MSSA.  He was started on antibiotics, surveillance cultures remain negative.  TEE from 6/4 does not show any vegetation but MRI does show small fluid collection of the rest and surgery recommended conservative management.  Infectious disease recommended IV nafcillin from negative cultures on 5/28 till 01/10/2023, then switched to cefazolin 2 g IV every 8 hourly for additional 6 weeks, thereafter cefadroxil 1000 mg twice daily for 10 months plus.    01/09/2023: Guarded prognosis as per ID.  Will have a low threshold to consult the palliative care team.  Continue IV antibiotics as outlined by infectious disease team (please see above).  01/10/2023: Patient seen alongside patient's son and daughter-in-law.  Family was updated.  No new complaints.  Continue antibiotics as outlined by infectious disease team.  Family meeting is planned for Sunday, 01/12/2023.  01/11/2023: Patient seen.  No new changes.  Patient remains  very ill.  Continue IV antibiotics as per infectious disease team.  For family meeting tomorrow   Assessment & Plan:  Principal Problem:   Discitis of lumbar region Active Problems:   Diabetes mellitus type 2 in nonobese (HCC)   Dyslipidemia   Essential hypertension   New onset atrial fibrillation (HCC)   Sepsis (HCC)   BPH (benign prostatic hyperplasia)   Mood disorder (HCC)   MSSA bacteremia    Severe sepsis secondary to persistent vertebral osteonecrosis/infarct of L4-L-L5 with discitis/septic arthritis and multiple loculated fluid collection around psoas muscle MSSA bacteremia -Initial blood cultures are growing MSSA and was started on IV vancomycin, eventually surveillance cultures remain negative and patient has been on IV nafcillin with ID recommendations.  IV antibiotics per infectious disease. -MRI lumbar spine shows lumbar discitis, osteomyelitis and epidural abscess-unfortunately due to poor prognosis no further surgical options from neurosurgery 01/11/2023: IV antibiotics as above.   left wrist edema/concern for tenosynovitis: -Conservative management per orthopedic. MRI shows persistent and worsening abscess of the right wrist, [facility] was consulted, status post bedside aspiration 6/10 but not enough sample.  Grossly did not appear purulent per their documentation 01/09/2023: IV antibiotics as above.  Acute kidney injury: Baseline creatinine 0.7, resolved.  Creatinine today 1.0 > 1.33.  Clinically getting dehydrated therefore we will give gentle fluids 01/09/2023:  BUN of 25 and serum creatinine of 1.24 today.  Continue to monitor renal function and electrolytes closely. 01/11/2023: Stable.  Serum creatinine of 1.26 today.  Osteonecrosis of L4-L5: -Pain control.  Not a surgical candidate   New onset atrial fibrillation: -Currently on amiodarone, Lovenox every 12 hours.  Iron deficiency anemia:  Iron supplements with  bowel regimen   Hypokalemia:  As needed  repletion    Acute thrombocytopenia: Resolved   Elevated LFTs and hyperbilirubinemia: Stable   Elevate troponin: -Likely demand ischemia.  Patient denies any chest pain.   BPH: -Continue Flomax.   Diabetes mellitus type 2 with hyperglycemia: -A1c of 8.1. Sliding scale and Accu-Chek.  Semglee 15 units at bedtime   Obstructive sleep apnea: -Continue CPAP at night.   Hyperlipidemia:  -continue statins.   Mood disorder: -Continue Lexapro.  Bedtime Risperdal   Moderate protein caloric malnutrition: -Noted.  Patient has already been seen by palliative care.  Currently DNR but continue full treatment Ongoing discussions by palliative regarding goals of care. Overall very poor prognosis     DVT prophylaxis: Heparin Code Status: DNR Family Communication:  None present at bedside.  Will require long-term IV antibiotics.  Will discuss with TOC regarding disposition Will start having DOC explore options regarding long-term placement such as SNF/LTAC for IV antibiotics  Pressure Injury 01/03/23 Buttocks Right;Left Stage 2 -  Partial thickness loss of dermis presenting as a shallow open injury with a red, pink wound bed without slough. 1x1 (Active)  01/03/23 2100  Location: Buttocks  Location Orientation: Right;Left  Staging: Stage 2 -  Partial thickness loss of dermis presenting as a shallow open injury with a red, pink wound bed without slough.  Wound Description (Comments): 1x1  Present on Admission: No     Diet Orders (From admission, onward)     Start     Ordered   01/06/23 1512  Diet Heart Room service appropriate? Yes; Fluid consistency: Thin  Diet effective now       Question Answer Comment  Room service appropriate? Yes   Fluid consistency: Thin      01/06/23 1511            Subjective: Seen at bedside.  Denies any complaints.  Overall appears very weak.  Generally poor nutrition  Examination:  Constitutional: Awake.  Not in any distress.   Chronically ill looking.   Lungs: Decreased air entry. Cardiovascular: S1-S2. Abdomen: Soft and nontender.  Neurologic: Awake and alert. Extremities: No leg edema. . Objective: Vitals:   01/11/23 0338 01/11/23 0800 01/11/23 1138 01/11/23 1202  BP: (!) 125/58 128/72 (!) 126/56 138/66  Pulse:  84    Resp: (!) 22 (!) 23 (!) 25 20  Temp: 97.9 F (36.6 C) 97.9 F (36.6 C) 98.4 F (36.9 C)   TempSrc: Oral Oral Oral   SpO2: 97% 97% 97% 96%  Weight:      Height:        Intake/Output Summary (Last 24 hours) at 01/11/2023 1440 Last data filed at 01/11/2023 1139 Gross per 24 hour  Intake 723.39 ml  Output 850 ml  Net -126.61 ml    Filed Weights   12/31/22 1600 01/01/23 1500 01/05/23 0346  Weight: 93 kg 93 kg 86.5 kg    Scheduled Meds:  acetaminophen  500 mg Oral TID   amiodarone  200 mg Oral Daily   azelastine  1 spray Each Nare BID   Chlorhexidine Gluconate Cloth  6 each Topical Daily   enoxaparin (LOVENOX) injection  90 mg Subcutaneous Q12H   escitalopram  10 mg Oral Daily   feeding supplement  237 mL Oral BID BM   ferrous sulfate  325 mg Oral Q breakfast   fluticasone  2 spray Each Nare Daily   gabapentin  100 mg Oral BID   insulin aspart  0-15 Units Subcutaneous  TID WC   insulin aspart  0-5 Units Subcutaneous QHS   insulin glargine-yfgn  15 Units Subcutaneous QHS   ketotifen  1 drop Left Eye BID   lidocaine  2 patch Transdermal Q24H   loratadine  10 mg Oral Daily   melatonin  3 mg Oral QHS   methocarbamol  500 mg Oral BID   pantoprazole  40 mg Oral BID   risperiDONE  0.5 mg Oral QHS   rosuvastatin  20 mg Oral Daily   sodium chloride flush  10-40 mL Intracatheter Q12H   sodium chloride flush  3 mL Intravenous Q12H   tamsulosin  0.4 mg Oral Daily   Continuous Infusions:   ceFAZolin (ANCEF) IV 2 g (01/11/23 1407)   lactated ringers      Nutritional status     Body mass index is 25.86 kg/m.  Data Reviewed:   CBC: Recent Labs  Lab 01/07/23 0324  01/08/23 0440 01/09/23 0610 01/10/23 0447 01/11/23 0445  WBC 8.9 7.2 6.9 7.0 8.7  HGB 9.5* 8.8* 8.6* 8.4* 8.6*  HCT 30.5* 28.8* 28.6* 28.0* 28.5*  MCV 83.6 84.7 85.6 84.3 85.6  PLT 366 317 363 316 308    Basic Metabolic Panel: Recent Labs  Lab 01/07/23 0324 01/08/23 0440 01/09/23 0610 01/10/23 0447 01/11/23 0445  NA 138 139 139 139 139  K 3.5 3.3* 3.2* 2.8* 3.8  CL 100 98 102 104 102  CO2 30 28 27 27 25   GLUCOSE 136* 172* 152* 104* 187*  BUN 17 26* 25* 22 23  CREATININE 1.00 1.33* 1.24 1.21 1.26*  CALCIUM 8.2* 8.0* 8.1* 8.1* 8.4*  MG 1.8 2.0 2.1 2.0 2.0  PHOS  --   --   --   --  4.1    GFR: Estimated Creatinine Clearance: 59.9 mL/min (A) (by C-G formula based on SCr of 1.26 mg/dL (H)). Liver Function Tests: Recent Labs  Lab 01/11/23 0445  ALBUMIN <1.5*    No results for input(s): "LIPASE", "AMYLASE" in the last 168 hours. No results for input(s): "AMMONIA" in the last 168 hours. Coagulation Profile: No results for input(s): "INR", "PROTIME" in the last 168 hours. Cardiac Enzymes: No results for input(s): "CKTOTAL", "CKMB", "CKMBINDEX", "TROPONINI" in the last 168 hours. BNP (last 3 results) No results for input(s): "PROBNP" in the last 8760 hours. HbA1C: No results for input(s): "HGBA1C" in the last 72 hours. CBG: Recent Labs  Lab 01/10/23 1112 01/10/23 1559 01/10/23 2105 01/11/23 0621 01/11/23 1146  GLUCAP 118* 223* 292* 175* 186*    Lipid Profile: No results for input(s): "CHOL", "HDL", "LDLCALC", "TRIG", "CHOLHDL", "LDLDIRECT" in the last 72 hours. Thyroid Function Tests: No results for input(s): "TSH", "T4TOTAL", "FREET4", "T3FREE", "THYROIDAB" in the last 72 hours. Anemia Panel: No results for input(s): "VITAMINB12", "FOLATE", "FERRITIN", "TIBC", "IRON", "RETICCTPCT" in the last 72 hours.  Sepsis Labs: No results for input(s): "PROCALCITON", "LATICACIDVEN" in the last 168 hours.  No results found for this or any previous visit (from the  past 240 hour(s)).       Radiology Studies: No results found.         LOS: 18 days   Time spent= 35 mins    Barnetta Chapel, MD [facility]  If 7PM-7AM, please contact night-coverage  01/11/2023, 2:40 PM

## 2023-01-11 NOTE — Plan of Care (Signed)
  Problem: Education: Goal: Knowledge of General Education information will improve Description: Including pain rating scale, medication(s)/side effects and non-pharmacologic comfort measures Outcome: Progressing   Problem: Health Behavior/Discharge Planning: Goal: Ability to manage health-related needs will improve Outcome: Progressing   Problem: Clinical Measurements: Goal: Ability to maintain clinical measurements within normal limits will improve Outcome: Progressing   Problem: Activity: Goal: Risk for activity intolerance will decrease Outcome: Progressing   Problem: Coping: Goal: Level of anxiety will decrease Outcome: Progressing   Problem: Elimination: Goal: Will not experience complications related to bowel motility Outcome: Progressing Goal: Will not experience complications related to urinary retention Outcome: Progressing   Problem: Pain Managment: Goal: General experience of comfort will improve Outcome: Progressing   Problem: Safety: Goal: Ability to remain free from injury will improve Outcome: Progressing   Problem: Skin Integrity: Goal: Risk for impaired skin integrity will decrease Outcome: Progressing   

## 2023-01-11 NOTE — Plan of Care (Signed)
  Problem: Education: Goal: Knowledge of General Education information will improve Description: Including pain rating scale, medication(s)/side effects and non-pharmacologic comfort measures Outcome: Progressing   Problem: Health Behavior/Discharge Planning: Goal: Ability to manage health-related needs will improve Outcome: Progressing   Problem: Clinical Measurements: Goal: Ability to maintain clinical measurements within normal limits will improve Outcome: Progressing Goal: Will remain free from infection Outcome: Progressing Goal: Cardiovascular complication will be avoided Outcome: Progressing   Problem: Coping: Goal: Level of anxiety will decrease Outcome: Progressing   Problem: Elimination: Goal: Will not experience complications related to urinary retention Outcome: Progressing   Problem: Pain Managment: Goal: General experience of comfort will improve Outcome: Progressing   Problem: Safety: Goal: Ability to remain free from injury will improve Outcome: Progressing   Problem: Activity: Goal: Ability to tolerate increased activity will improve Outcome: Progressing   Problem: Health Behavior/Discharge Planning: Goal: Ability to safely manage health-related needs after discharge will improve Outcome: Progressing

## 2023-01-12 DIAGNOSIS — R5381 Other malaise: Secondary | ICD-10-CM

## 2023-01-12 DIAGNOSIS — M4646 Discitis, unspecified, lumbar region: Secondary | ICD-10-CM | POA: Diagnosis not present

## 2023-01-12 LAB — BASIC METABOLIC PANEL
Anion gap: 7 (ref 5–15)
BUN: 21 mg/dL (ref 8–23)
CO2: 25 mmol/L (ref 22–32)
Calcium: 8.3 mg/dL — ABNORMAL LOW (ref 8.9–10.3)
Chloride: 106 mmol/L (ref 98–111)
Creatinine, Ser: 0.99 mg/dL (ref 0.61–1.24)
GFR, Estimated: >60 mL/min (ref >60)
Glucose, Bld: 136 mg/dL — ABNORMAL HIGH (ref 70–99)
Potassium: 3.5 mmol/L (ref 3.5–5.1)
Sodium: 138 mmol/L (ref 135–145)

## 2023-01-12 LAB — GLUCOSE, CAPILLARY
Glucose-Capillary: 120 mg/dL — ABNORMAL HIGH (ref 70–99)
Glucose-Capillary: 132 mg/dL — ABNORMAL HIGH (ref 70–99)
Glucose-Capillary: 140 mg/dL — ABNORMAL HIGH (ref 70–99)
Glucose-Capillary: 180 mg/dL — ABNORMAL HIGH (ref 70–99)

## 2023-01-12 LAB — MAGNESIUM: Magnesium: 2 mg/dL (ref 1.7–2.4)

## 2023-01-12 LAB — CBC
HCT: 28.4 % — ABNORMAL LOW (ref 39.0–52.0)
Hemoglobin: 8.5 g/dL — ABNORMAL LOW (ref 13.0–17.0)
MCH: 25.1 pg — ABNORMAL LOW (ref 26.0–34.0)
MCHC: 29.9 g/dL — ABNORMAL LOW (ref 30.0–36.0)
MCV: 83.8 fL (ref 80.0–100.0)
Platelets: 278 10*3/uL (ref 150–400)
RBC: 3.39 MIL/uL — ABNORMAL LOW (ref 4.22–5.81)
RDW: 17.5 % — ABNORMAL HIGH (ref 11.5–15.5)
WBC: 9 10*3/uL (ref 4.0–10.5)
nRBC: 0 % (ref 0.0–0.2)

## 2023-01-12 MED ORDER — ORAL CARE MOUTH RINSE: 15.0000 mL | OROMUCOSAL | Status: DC | PRN

## 2023-01-12 NOTE — Plan of Care (Signed)
  Problem: Education: Goal: Knowledge of General Education information will improve Description: Including pain rating scale, medication(s)/side effects and non-pharmacologic comfort measures Outcome: Progressing   Problem: Health Behavior/Discharge Planning: Goal: Ability to manage health-related needs will improve Outcome: Progressing   Problem: Clinical Measurements: Goal: Ability to maintain clinical measurements within normal limits will improve Outcome: Progressing Goal: Diagnostic test results will improve Outcome: Progressing Goal: Respiratory complications will improve Outcome: Progressing Goal: Cardiovascular complication will be avoided Outcome: Progressing   Problem: Activity: Goal: Risk for activity intolerance will decrease Outcome: Progressing   Problem: Nutrition: Goal: Adequate nutrition will be maintained Outcome: Progressing   Problem: Coping: Goal: Level of anxiety will decrease Outcome: Progressing   Problem: Elimination: Goal: Will not experience complications related to bowel motility Outcome: Progressing Goal: Will not experience complications related to urinary retention Outcome: Progressing   Problem: Pain Managment: Goal: General experience of comfort will improve Outcome: Progressing   Problem: Safety: Goal: Ability to remain free from injury will improve Outcome: Progressing   Problem: Skin Integrity: Goal: Risk for impaired skin integrity will decrease Outcome: Progressing   Problem: Activity: Goal: Ability to tolerate increased activity will improve Outcome: Progressing

## 2023-01-12 NOTE — Plan of Care (Signed)

## 2023-01-12 NOTE — Progress Notes (Signed)
PROGRESS NOTE    Jim Ramirez  GNF:[id number] DOB: 1951-07-06 DOA: 12/24/2022 PCP: [name], MD   Brief Narrative:  Patient is a 72 year old male with past medical history significant for hypertension and diabetes.  Patient initially presented to [facility] on 12/22/2022 with chronic back pain radiating both her legs, with some shortness of breath that started 1 week prior to admission CT scan of the abdomen pelvis or lumbar spine discitis and osteomyelitis, transferred to [facility] found to be bacteremic with staph aureus with necrosis of the lumbar spine, also in A-fib with RVR, neurology, ID and cardiology were consulted. MRI of the wrist was done that showed fluid collection and surgery was consulted who recommended conservative management. Despite IV antibiotics he continues to spike fevers.  MRI lumbar spine done on 6/7 showed worsening of infection/concerns of epidural abscess but neurosurgery recommending against any surgical intervention.  Initial blood cultures at [facility] grew MSSA.  He was started on antibiotics, surveillance cultures remain negative.  TEE from 6/4 does not show any vegetation but MRI does show small fluid collection of the rest and surgery recommended conservative management.  Infectious disease recommended IV nafcillin from negative cultures on 5/28 till 01/10/2023, then switched to cefazolin 2 g IV every 8 hourly for additional 6 weeks, thereafter cefadroxil 1000 mg twice daily for 10 months plus.    01/12/2023: Patient seen.  No new changes.  Patient remains very ill.  Continue IV antibiotics as per infectious disease team.    Assessment & Plan:  Principal Problem:   Discitis of lumbar region Active Problems:   Diabetes mellitus type 2 in nonobese (HCC)   Dyslipidemia   Essential hypertension   New onset atrial fibrillation (HCC)   Sepsis (HCC)   BPH (benign prostatic hyperplasia)   Mood disorder (HCC)   MSSA bacteremia    Severe sepsis secondary to  persistent vertebral osteonecrosis/infarct of L4-L-L5 with discitis/septic arthritis and multiple loculated fluid collection around psoas muscle MSSA bacteremia -Initial blood cultures grew MSSA. -Infectious disease team is directing antibiotics.  Please see above.  Patient will need to be on antibiotics for a very long time. -Guarded prognosis.  -MRI lumbar spine shows lumbar discitis, osteomyelitis and epidural abscess-unfortunately due to poor prognosis no further surgical options from neurosurgery   left wrist edema/concern for tenosynovitis: -Conservative management per orthopedic. MRI shows persistent and worsening abscess of the right wrist, [facility] was consulted, status post bedside aspiration 6/10 but not enough sample.  Grossly did not appear purulent per their documentation 01/12/2023: IV antibiotics as above.  Acute kidney injury: Baseline creatinine 0.7, resolved.  Creatinine today 1.0 > 1.33.  Clinically getting dehydrated therefore we will give gentle fluids 01/12/2023: Resolved.  Serum creatinine is 0.99 today.    Osteonecrosis of L4-L5: -Pain control.  Not a surgical candidate   New onset atrial fibrillation: -Currently on amiodarone, Lovenox every 12 hours.  Iron deficiency anemia:  Iron supplements with bowel regimen   Hypokalemia:  -Improved significantly. -Potassium is 3.5 today.   Acute thrombocytopenia: Resolved   Elevated LFTs and hyperbilirubinemia: Stable   Elevate troponin: -Likely demand ischemia.   -No chest pain.     BPH: -Continue Flomax.   Diabetes mellitus type 2 with hyperglycemia: -A1c of 8.1. Sliding scale and Accu-Chek.  Semglee 15 units at bedtime   Obstructive sleep apnea: -Continue CPAP at night.   Hyperlipidemia:  -continue statins.   Mood disorder: -Continue Lexapro.  Bedtime Risperdal   Moderate protein caloric malnutrition: -Noted.  Patient has already been seen by palliative care.  Currently DNR but continue full  treatment Ongoing discussions by palliative regarding goals of care. Overall very poor prognosis     DVT prophylaxis: Heparin Code Status: DNR Family Communication:  None present at bedside.  Will require long-term IV antibiotics.  Will discuss with TOC regarding disposition Will start having DOC explore options regarding long-term placement such as SNF/LTAC for IV antibiotics  Pressure Injury 01/03/23 Buttocks Right;Left Stage 2 -  Partial thickness loss of dermis presenting as a shallow open injury with a red, pink wound bed without slough. 1x1 (Active)  01/03/23 2100  Location: Buttocks  Location Orientation: Right;Left  Staging: Stage 2 -  Partial thickness loss of dermis presenting as a shallow open injury with a red, pink wound bed without slough.  Wound Description (Comments): 1x1  Present on Admission: No     Diet Orders (From admission, onward)     Start     Ordered   01/06/23 1512  Diet Heart Room service appropriate? Yes; Fluid consistency: Thin  Diet effective now       Question Answer Comment  Room service appropriate? Yes   Fluid consistency: Thin      01/06/23 1511            Subjective: Seen at bedside.  Denies any complaints.  Overall appears very weak.  Generally poor nutrition  Examination:  Constitutional: Awake.  Not in any distress.  Chronically ill looking.   Lungs: Decreased air entry. Cardiovascular: S1-S2. Abdomen: Soft and nontender.  Neurologic: Awake and alert. Extremities: No leg edema. . Objective: Vitals:   01/11/23 2313 01/12/23 0312 01/12/23 0713 01/12/23 1145  BP: (!) 135/57 137/66 (!) 146/68 133/69  Pulse:   90 86  Resp: 14 19 (!) 21 19  Temp: 98.3 F (36.8 C) 98.4 F (36.9 C) 97.8 F (36.6 C) 98.4 F (36.9 C)  TempSrc: Oral Oral Axillary Oral  SpO2: 96% 95%    Weight:      Height:        Intake/Output Summary (Last 24 hours) at 01/12/2023 1659 Last data filed at 01/12/2023 1511 Gross per 24 hour  Intake 160.77 ml   Output 1130 ml  Net -969.23 ml    Filed Weights   12/31/22 1600 01/01/23 1500 01/05/23 0346  Weight: 93 kg 93 kg 86.5 kg    Scheduled Meds:  acetaminophen  500 mg Oral TID   amiodarone  200 mg Oral Daily   azelastine  1 spray Each Nare BID   Chlorhexidine Gluconate Cloth  6 each Topical Daily   enoxaparin (LOVENOX) injection  90 mg Subcutaneous Q12H   escitalopram  10 mg Oral Daily   feeding supplement  237 mL Oral BID BM   ferrous sulfate  325 mg Oral Q breakfast   fluticasone  2 spray Each Nare Daily   gabapentin  100 mg Oral BID   insulin aspart  0-15 Units Subcutaneous TID WC   insulin aspart  0-5 Units Subcutaneous QHS   insulin glargine-yfgn  15 Units Subcutaneous QHS   ketotifen  1 drop Left Eye BID   lidocaine  2 patch Transdermal Q24H   loratadine  10 mg Oral Daily   melatonin  3 mg Oral QHS   methocarbamol  500 mg Oral BID   pantoprazole  40 mg Oral BID   risperiDONE  0.5 mg Oral QHS   rosuvastatin  20 mg Oral Daily   sodium chloride flush  10-40  mL Intracatheter Q12H   sodium chloride flush  3 mL Intravenous Q12H   tamsulosin  0.4 mg Oral Daily   Continuous Infusions:   ceFAZolin (ANCEF) IV 10 mL/hr at 01/12/23 1511   lactated ringers      Nutritional status     Body mass index is 25.86 kg/m.  Data Reviewed:   CBC: Recent Labs  Lab 01/08/23 0440 01/09/23 0610 01/10/23 0447 01/11/23 0445 01/12/23 0405  WBC 7.2 6.9 7.0 8.7 9.0  HGB 8.8* 8.6* 8.4* 8.6* 8.5*  HCT 28.8* 28.6* 28.0* 28.5* 28.4*  MCV 84.7 85.6 84.3 85.6 83.8  PLT 317 363 316 308 278    Basic Metabolic Panel: Recent Labs  Lab 01/08/23 0440 01/09/23 0610 01/10/23 0447 01/11/23 0445 01/12/23 0405  NA 139 139 139 139 138  K 3.3* 3.2* 2.8* 3.8 3.5  CL 98 102 104 102 106  CO2 28 27 27 25 25   GLUCOSE 172* 152* 104* 187* 136*  BUN 26* 25* 22 23 21   CREATININE 1.33* 1.24 1.21 1.26* 0.99  CALCIUM 8.0* 8.1* 8.1* 8.4* 8.3*  MG 2.0 2.1 2.0 2.0 2.0  PHOS  --   --   --  4.1  --      GFR: Estimated Creatinine Clearance: 76.2 mL/min (by C-G formula based on SCr of 0.99 mg/dL). Liver Function Tests: Recent Labs  Lab 01/11/23 0445  ALBUMIN <1.5*     No results for input(s): "LIPASE", "AMYLASE" in the last 168 hours. No results for input(s): "AMMONIA" in the last 168 hours. Coagulation Profile: No results for input(s): "INR", "PROTIME" in the last 168 hours. Cardiac Enzymes: No results for input(s): "CKTOTAL", "CKMB", "CKMBINDEX", "TROPONINI" in the last 168 hours. BNP (last 3 results) No results for input(s): "PROBNP" in the last 8760 hours. HbA1C: No results for input(s): "HGBA1C" in the last 72 hours. CBG: Recent Labs  Lab 01/11/23 1625 01/11/23 2130 01/12/23 0617 01/12/23 1146 01/12/23 1640  GLUCAP 174* 156* 120* 132* 140*    Lipid Profile: No results for input(s): "CHOL", "HDL", "LDLCALC", "TRIG", "CHOLHDL", "LDLDIRECT" in the last 72 hours. Thyroid Function Tests: No results for input(s): "TSH", "T4TOTAL", "FREET4", "T3FREE", "THYROIDAB" in the last 72 hours. Anemia Panel: No results for input(s): "VITAMINB12", "FOLATE", "FERRITIN", "TIBC", "IRON", "RETICCTPCT" in the last 72 hours.  Sepsis Labs: No results for input(s): "PROCALCITON", "LATICACIDVEN" in the last 168 hours.  No results found for this or any previous visit (from the past 240 hour(s)).       Radiology Studies: No results found.         LOS: 19 days   Time spent= 35 mins    Barnetta Chapel, MD [facility]  If 7PM-7AM, please contact night-coverage  01/12/2023, 4:59 PM

## 2023-01-13 DIAGNOSIS — M4646 Discitis, unspecified, lumbar region: Secondary | ICD-10-CM | POA: Diagnosis not present

## 2023-01-13 LAB — GLUCOSE, CAPILLARY
Glucose-Capillary: 122 mg/dL — ABNORMAL HIGH (ref 70–99)
Glucose-Capillary: 128 mg/dL — ABNORMAL HIGH (ref 70–99)
Glucose-Capillary: 214 mg/dL — ABNORMAL HIGH (ref 70–99)
Glucose-Capillary: 134 mg/dL — ABNORMAL HIGH (ref 70–99)

## 2023-01-13 MED ORDER — APIXABAN 5 MG PO TABS
5.0000 mg | ORAL_TABLET | Freq: Two times a day (BID) | ORAL | Status: DC
  Administered 2023-01-13 – 2023-01-14 (×2): 5 mg via ORAL
  Filled 2023-01-13 (×2): qty 1

## 2023-01-13 MED ORDER — CEFADROXIL 500 MG PO CAPS: 1000.0000 mg | ORAL_CAPSULE | Freq: Two times a day (BID) | ORAL | Status: DC

## 2023-01-13 NOTE — Progress Notes (Addendum)
ANTICOAGULATION CONSULT NOTE Pharmacy Consult for  Enoxaparin>>apixaban Indication: atrial fibrillation  No Known Allergies  Patient Measurements: Height: 6' (182.9 cm) Weight: 86.5 kg (190 lb 11.2 oz) IBW/kg (Calculated) : 77.6 Heparin Dosing Weight: 93kg  Vital Signs: Temp: 98.6 F (37 C) (06/17 0807) Temp Source: Oral (06/17 0807) BP: 129/67 (06/17 0807)  Labs: Recent Labs    01/11/23 0445 01/12/23 0405  HGB 8.6* 8.5*  HCT 28.5* 28.4*  PLT 308 278  CREATININE 1.26* 0.99     Estimated Creatinine Clearance: 76.2 mL/min (by C-G formula based on SCr of 0.99 mg/dL).  Assessment: 72 y.o. male with new-onset AFib with RVR in the setting of MSSA bacteremia and lumbar spine osteomyelitis and pyogenic ventriculitis. Patient not taking any anticoagulation prior to admission. Pharmacy initially consulted to dose heparin infusion for AFib.  Then on 01/03/23 Heparin was changed to Enoxaparin treatment dosing after pharmacist discussion with MD due to concern for possible heparin resistance.     Continues on Enoxaparin 90 mg SQ q12h for new onset Afib.  Dose is ~ 1 mg/kg q12h. Weight 86.5 kg  Hgb 9s, Plt wnl - stable in 300s. Iron defic anemia, on po iron supplement daily w/ bowel regimen as needed.  12/30/22 Vasc US BUE duplex:  bilateral UE acute superficial thrombus;No evidence of DVT.  Renal: AKI, F/u SCr trending down , 1.21 today.  CrCl estimated 62.4 ml/hr.  Current enoxaparin dose is appropriate.   Follow up on plan for transition to oral Anticoagulant .  30 day cost of apixaban or rivaroxaban is $11.20/month   Pt is stable on Lovenox. Messaged to consider changing back to apixaban.   Addendum  Ok to resume apixaban  Hgbs ~8s, plt wnl, scr <1  Goal of Therapy:  antiXa level:  0.6-1 unit/ml  peak level (4 hours after a dose) Monitor PLTC    Plan:  Dc lovenox Resume apixaban 5mg  PO BID tonight Of note, copay cost of apixaban or rivaroxaban is $11.20/month Rx will  follow peripherally  [name], PharmD, BCIDP, AAHIVP, CPP Infectious Disease Pharmacist 01/13/2023 11:19 AM

## 2023-01-13 NOTE — Discharge Instructions (Signed)

## 2023-01-13 NOTE — Progress Notes (Signed)
   Providing Compassionate, Quality Care - Together   Subjective: Patient reports significant back pain with radiation into his lower extremities. The pain is worse with movement.  Objective: Vital signs in last 24 hours: Temp:  [98.1 F (36.7 C)-98.6 F (37 C)] 98.6 F (37 C) (06/17 1130) Resp:  [16-23] 19 (06/17 1130) BP: (129-138)/(60-68) 132/67 (06/17 1130) SpO2:  [94 %-98 %] 97 % (06/17 1130)  Intake/Output from previous day: 06/16 0701 - 06/17 0700 In: 281.4 [I.V.:10; IV Piggyback:271.4] Out: 1060 [Urine:1060] Intake/Output this shift: Total I/O In: 27.7 [IV Piggyback:27.7] Out: -   Alert and oriented x 4 PERRLA MAE, Mild generalized weakness likely secondary to deconditioning  Lab Results: Recent Labs    01/11/23 0445 01/12/23 0405  WBC 8.7 9.0  HGB 8.6* 8.5*  HCT 28.5* 28.4*  PLT 308 278   BMET Recent Labs    01/11/23 0445 01/12/23 0405  NA 139 138  K 3.8 3.5  CL 102 106  CO2 25 25  GLUCOSE 187* 136*  BUN 23 21  CREATININE 1.26* 0.99  CALCIUM 8.4* 8.3*    Studies/Results: No results found.  Assessment/Plan: Patient admitted with significant back pain with radiation down BLE. He was septic with elevated troponin and a-fib with RVR. 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   LOS: 20 days   -Continue antibiotics per ID's recommendation. -Work toward better pain control.   Val Eagle, DNP, AGNP-C Nurse Practitioner  [facility] [address], [address], [place], [place] [address] P: [phone]    F: [phone]  01/13/2023, 2:15 PM

## 2023-01-13 NOTE — Progress Notes (Signed)
Palliative Medicine Progress Note   Patient Name: Jim Ramirez       Date: 01/13/2023 DOB: 07/02/1951  Age: 72 y.o. MRN#: [id number] Attending Physician: Barnetta Chapel, MD Primary Care Physician: [name], MD Admit Date: 12/24/2022    HPI/Patient Profile: 72 y.o. male  with past medical history of DM and HTN who initially presented to [facility] on 12/22/2022 with complaint of back pain with radiation down both legs.  He was found to have MSSA bacteremia and osteonecrosis of the lumbar spine.  He was also found to have new onset A-fib with RVR.  He was transferred to [facility] on 12/24/2022 for further management of severe sepsis.  Palliative Medicine was consulted for goals of care conversations.  Subjective: Patient is alert and oriented. He reports ongoing back pain.   Family Meeting: I met today at bedside with patient and multiple family members.  I provided a detailed review of his hospital course and current clinical condition.  We reviewed his multiple infectious processes including MSSA bacteremia, ventriculitis, right hand osteomyelitis and abscess, and lumbar osteomyelitis/epidural abscess.  We discussed that initial MRI spine on 12/20/2022 showed evidence of infarction of the L4 and L5 vertebral bodies (consistent with osteonecrosis), but was not consistent with osteomyelitis at that time.  However, repeat MRI spine on 01/03/2023 showed abscesses.    We discussed that unfortunately patient is not a good surgical candidate due to multiple comorbid conditions, physical debility/weakness, and poor nutritional status (with albumin<1.5). I provided eduction that without source control, osteomyelitis is very challenging to treat and will likely be an ongoing/recurrent issue.  We  discussed that patient has not made significant improvement thus far.  We discussed that it is very unlikely he will return to previous baseline.  Reviewed the possibility that he may remain dependent on others for care indefinitely. Reviewed that patient has previously stated this would not be consistent with good quality of life for him.  Finally, we discussed different paths of care - full scope versus limited interventions (treat the treatable) versus comfort care. At this time, patient remains clear he wants to continue current scope of treatment (treat the treatable).  We again reviewed that at any time, he can say "enough is enough" and decide to shift to more comfort focused care.  Family expresses appreciation for  the information discussed today. They verbalize understanding of the seriousness of patient's medical situation, his very guarded prognosis, and that he not a good surgical candidate.  However, they are requesting a second opinion from neurosurgery from outside hospital (for peace of mind that all options have been explored).   Objective:  Physical Exam Constitutional:      General: He is not in acute distress.    Appearance: He is ill-appearing.  Pulmonary:     Effort: Pulmonary effort is normal.  Neurological:     Mental Status: He is alert and oriented to person, place, and time.     Motor: Weakness present.               Palliative Medicine Assessment & Plan   Assessment: Principal Problem:   Discitis of lumbar region Active Problems:   Diabetes mellitus type 2 in nonobese (HCC)   Dyslipidemia   Essential hypertension   New onset atrial fibrillation (HCC)   Sepsis (HCC)   BPH (benign prostatic hyperplasia)   Mood disorder (HCC)   MSSA bacteremia    Recommendations/Plan: Continue current interventions Patient and family are ultimately hopeful for improvement Patient and family request a second opinion from neurosurgery from an outside healthcare  system PMT will continue to follow  Code Status: DNR   Prognosis:  Very guarded  Discharge Planning: To Be Determined  Care plan was discussed with Dr. Dartha Lodge   Thank you for allowing the Palliative Medicine Team to assist in the care of this patient.   Greater than 50%  of this time was spent counseling and coordinating care related to the above assessment and plan.  Total time: 82 minutes   Merry Proud, NP Palliative Medicine   Please contact Palliative Medicine Team phone at [phone] for questions and concerns.  For individual provider, see AMION.

## 2023-01-13 NOTE — Plan of Care (Signed)
  Problem: Education: Goal: Knowledge of General Education information will improve Description: Including pain rating scale, medication(s)/side effects and non-pharmacologic comfort measures Outcome: Progressing   Problem: Health Behavior/Discharge Planning: Goal: Ability to manage health-related needs will improve Outcome: Progressing   Problem: Clinical Measurements: Goal: Ability to maintain clinical measurements within normal limits will improve Outcome: Progressing Goal: Will remain free from infection Outcome: Progressing Goal: Diagnostic test results will improve Outcome: Progressing Goal: Respiratory complications will improve Outcome: Progressing Goal: Cardiovascular complication will be avoided Outcome: Progressing   Problem: Activity: Goal: Risk for activity intolerance will decrease Outcome: Progressing   Problem: Nutrition: Goal: Adequate nutrition will be maintained Outcome: Progressing   Problem: Coping: Goal: Level of anxiety will decrease Outcome: Progressing   Problem: Elimination: Goal: Will not experience complications related to bowel motility Outcome: Progressing Goal: Will not experience complications related to urinary retention Outcome: Progressing   Problem: Pain Managment: Goal: General experience of comfort will improve Outcome: Progressing   Problem: Safety: Goal: Ability to remain free from injury will improve Outcome: Progressing   Problem: Skin Integrity: Goal: Risk for impaired skin integrity will decrease Outcome: Progressing   Problem: Activity: Goal: Ability to tolerate increased activity will improve Outcome: Progressing   Problem: Cardiac: Goal: Ability to achieve and maintain adequate cardiopulmonary perfusion will improve Outcome: Progressing   Problem: Coping: Goal: Ability to adjust to condition or change in health will improve Outcome: Progressing   Problem: Fluid Volume: Goal: Ability to maintain a balanced  intake and output will improve Outcome: Progressing

## 2023-01-13 NOTE — Progress Notes (Signed)
PROGRESS NOTE    Jim Ramirez  WUJ:[id number] DOB: 1951/04/14 DOA: 12/24/2022 PCP: [name], MD   Brief Narrative:  Patient is a 72 year old male with past medical history significant for hypertension and diabetes.  Patient initially presented to [facility] on 12/22/2022 with chronic back pain radiating both her legs, with some shortness of breath that started 1 week prior to admission CT scan of the abdomen pelvis or lumbar spine discitis and osteomyelitis, transferred to [facility] found to be bacteremic with staph aureus with necrosis of the lumbar spine, also in A-fib with RVR, neurology, ID and cardiology were consulted. MRI of the wrist was done that showed fluid collection and surgery was consulted who recommended conservative management. Despite IV antibiotics he continues to spike fevers.  MRI lumbar spine done on 6/7 showed worsening of infection/concerns of epidural abscess but neurosurgery recommending against any surgical intervention.  Initial blood cultures at [facility] grew MSSA.  He was started on antibiotics, surveillance cultures remain negative.  TEE from 6/4 does not show any vegetation but MRI does show small fluid collection of the rest and surgery recommended conservative management.  Infectious disease recommended IV nafcillin from negative cultures on 5/28 till 01/10/2023, then switched to cefazolin 2 g IV every 8 hourly for additional 6 weeks, thereafter cefadroxil 1000 mg twice daily for 10 months plus.    01/13/2023: Patient seen.  No new changes.  Neurosurgery input is appreciated.  Continue IV antibiotics as per infectious disease team.    Assessment & Plan:  Principal Problem:   Discitis of lumbar region Active Problems:   Diabetes mellitus type 2 in nonobese (HCC)   Dyslipidemia   Essential hypertension   New onset atrial fibrillation (HCC)   Sepsis (HCC)   BPH (benign prostatic hyperplasia)   Mood disorder (HCC)   MSSA bacteremia    Severe sepsis  secondary to persistent vertebral osteonecrosis/infarct of L4-L-L5 with discitis/septic arthritis and multiple loculated fluid collection around psoas muscle MSSA bacteremia -Initial blood cultures grew MSSA. -Infectious disease team is directing antibiotics.  Please see above.  Patient will need to be on antibiotics for a very long time. -Guarded prognosis.  -MRI lumbar spine shows lumbar discitis, osteomyelitis and epidural abscess-unfortunately due to poor prognosis no further surgical options from neurosurgery 01/13/2023: Complete course of antibiotics.  Pursue LTAC placement   left wrist edema/concern for tenosynovitis: -Conservative management per orthopedic. MRI shows persistent and worsening abscess of the right wrist, [facility] was consulted, status post bedside aspiration 6/10 but not enough sample.  Grossly did not appear purulent per their documentation 01/12/2023: IV antibiotics as above.  Acute kidney injury: Baseline creatinine 0.7, resolved.  Creatinine today 1.0 > 1.33.  Clinically getting dehydrated therefore we will give gentle fluids 01/12/2023: Resolved.  Serum creatinine is 0.99 today.    Osteonecrosis of L4-L5: -Pain control.  Not a surgical candidate   New onset atrial fibrillation: -Currently on amiodarone, Lovenox every 12 hours.  Iron deficiency anemia:  Iron supplements with bowel regimen   Hypokalemia:  -Improved significantly. -Potassium is 3.5 today.   Acute thrombocytopenia: Resolved   Elevated LFTs and hyperbilirubinemia: Stable   Elevate troponin: -Likely demand ischemia.   -No chest pain.     BPH: -Continue Flomax.   Diabetes mellitus type 2 with hyperglycemia: -A1c of 8.1. Sliding scale and Accu-Chek.  Semglee 15 units at bedtime   Obstructive sleep apnea: -Continue CPAP at night.   Hyperlipidemia:  -continue statins.   Mood disorder: -Continue Lexapro.  Bedtime Risperdal   Moderate protein caloric  malnutrition: -Noted.  Patient has already been seen by palliative care.  Currently DNR but continue full treatment Ongoing discussions by palliative regarding goals of care. Overall very poor prognosis     DVT prophylaxis: Heparin Code Status: DNR Family Communication:  None present at bedside.  Will require long-term IV antibiotics.  Will discuss with TOC regarding disposition Will start having DOC explore options regarding long-term placement such as SNF/LTAC for IV antibiotics  Pressure Injury 01/03/23 Buttocks Right;Left Stage 2 -  Partial thickness loss of dermis presenting as a shallow open injury with a red, pink wound bed without slough. 1x1 (Active)  01/03/23 2100  Location: Buttocks  Location Orientation: Right;Left  Staging: Stage 2 -  Partial thickness loss of dermis presenting as a shallow open injury with a red, pink wound bed without slough.  Wound Description (Comments): 1x1  Present on Admission: No     Diet Orders (From admission, onward)     Start     Ordered   01/06/23 1512  Diet Heart Room service appropriate? Yes; Fluid consistency: Thin  Diet effective now       Question Answer Comment  Room service appropriate? Yes   Fluid consistency: Thin      01/06/23 1511            Subjective: Seen at bedside.  Denies any complaints.  Overall appears very weak.  Generally poor nutrition  Examination:  Constitutional: Awake.  Not in any distress.  Chronically ill looking.   Lungs: Decreased air entry. Cardiovascular: S1-S2. Abdomen: Soft and nontender.  Neurologic: Awake and alert. Extremities: No leg edema. . Objective: Vitals:   01/13/23 0320 01/13/23 0807 01/13/23 1130 01/13/23 1646  BP: 135/63 129/67 132/67 119/73  Pulse:      Resp: 20 20 19  (!) 21  Temp: 98.1 F (36.7 C) 98.6 F (37 C) 98.6 F (37 C) 98.7 F (37.1 C)  TempSrc: Oral Oral Oral Oral  SpO2: 94% 98% 97% 96%  Weight:      Height:        Intake/Output Summary (Last 24  hours) at 01/13/2023 1910 Last data filed at 01/13/2023 0701 Gross per 24 hour  Intake 148.31 ml  Output 710 ml  Net -561.69 ml    Filed Weights   12/31/22 1600 01/01/23 1500 01/05/23 0346  Weight: 93 kg 93 kg 86.5 kg    Scheduled Meds:  acetaminophen  500 mg Oral TID   amiodarone  200 mg Oral Daily   apixaban  5 mg Oral BID   azelastine  1 spray Each Nare BID   [START ON 02/19/2023] cefadroxil  1,000 mg Oral BID   Chlorhexidine Gluconate Cloth  6 each Topical Daily   escitalopram  10 mg Oral Daily   feeding supplement  237 mL Oral BID BM   ferrous sulfate  325 mg Oral Q breakfast   fluticasone  2 spray Each Nare Daily   gabapentin  100 mg Oral BID   insulin aspart  0-15 Units Subcutaneous TID WC   insulin aspart  0-5 Units Subcutaneous QHS   insulin glargine-yfgn  15 Units Subcutaneous QHS   ketotifen  1 drop Left Eye BID   lidocaine  2 patch Transdermal Q24H   loratadine  10 mg Oral Daily   melatonin  3 mg Oral QHS   methocarbamol  500 mg Oral BID   pantoprazole  40 mg Oral BID   risperiDONE  0.5 mg  Oral QHS   rosuvastatin  20 mg Oral Daily   sodium chloride flush  10-40 mL Intracatheter Q12H   sodium chloride flush  3 mL Intravenous Q12H   tamsulosin  0.4 mg Oral Daily   Continuous Infusions:   ceFAZolin (ANCEF) IV Stopped (01/13/23 1542)   lactated ringers      Nutritional status     Body mass index is 25.86 kg/m.  Data Reviewed:   CBC: Recent Labs  Lab 01/08/23 0440 01/09/23 0610 01/10/23 0447 01/11/23 0445 01/12/23 0405  WBC 7.2 6.9 7.0 8.7 9.0  HGB 8.8* 8.6* 8.4* 8.6* 8.5*  HCT 28.8* 28.6* 28.0* 28.5* 28.4*  MCV 84.7 85.6 84.3 85.6 83.8  PLT 317 363 316 308 278    Basic Metabolic Panel: Recent Labs  Lab 01/08/23 0440 01/09/23 0610 01/10/23 0447 01/11/23 0445 01/12/23 0405  NA 139 139 139 139 138  K 3.3* 3.2* 2.8* 3.8 3.5  CL 98 102 104 102 106  CO2 28 27 27 25 25   GLUCOSE 172* 152* 104* 187* 136*  BUN 26* 25* 22 23 21   CREATININE  1.33* 1.24 1.21 1.26* 0.99  CALCIUM 8.0* 8.1* 8.1* 8.4* 8.3*  MG 2.0 2.1 2.0 2.0 2.0  PHOS  --   --   --  4.1  --     GFR: Estimated Creatinine Clearance: 76.2 mL/min (by C-G formula based on SCr of 0.99 mg/dL). Liver Function Tests: Recent Labs  Lab 01/11/23 0445  ALBUMIN <1.5*     No results for input(s): "LIPASE", "AMYLASE" in the last 168 hours. No results for input(s): "AMMONIA" in the last 168 hours. Coagulation Profile: No results for input(s): "INR", "PROTIME" in the last 168 hours. Cardiac Enzymes: No results for input(s): "CKTOTAL", "CKMB", "CKMBINDEX", "TROPONINI" in the last 168 hours. BNP (last 3 results) No results for input(s): "PROBNP" in the last 8760 hours. HbA1C: No results for input(s): "HGBA1C" in the last 72 hours. CBG: Recent Labs  Lab 01/12/23 1640 01/12/23 2116 01/13/23 0612 01/13/23 1125 01/13/23 1648  GLUCAP 140* 180* 122* 128* 214*    Lipid Profile: No results for input(s): "CHOL", "HDL", "LDLCALC", "TRIG", "CHOLHDL", "LDLDIRECT" in the last 72 hours. Thyroid Function Tests: No results for input(s): "TSH", "T4TOTAL", "FREET4", "T3FREE", "THYROIDAB" in the last 72 hours. Anemia Panel: No results for input(s): "VITAMINB12", "FOLATE", "FERRITIN", "TIBC", "IRON", "RETICCTPCT" in the last 72 hours.  Sepsis Labs: No results for input(s): "PROCALCITON", "LATICACIDVEN" in the last 168 hours.  No results found for this or any previous visit (from the past 240 hour(s)).       Radiology Studies: No results found.         LOS: 20 days   Time spent= 35 mins    Barnetta Chapel, MD [facility]  If 7PM-7AM, please contact night-coverage  01/13/2023, 7:10 PM

## 2023-01-14 ENCOUNTER — Inpatient Hospital Stay (HOSPITAL_COMMUNITY): Payer: Medicare HMO

## 2023-01-14 ENCOUNTER — Other Ambulatory Visit: Payer: Self-pay | Admitting: Neurosurgery

## 2023-01-14 DIAGNOSIS — E440 Moderate protein-calorie malnutrition: Secondary | ICD-10-CM | POA: Insufficient documentation

## 2023-01-14 DIAGNOSIS — M4646 Discitis, unspecified, lumbar region: Secondary | ICD-10-CM | POA: Diagnosis not present

## 2023-01-14 LAB — GLUCOSE, CAPILLARY
Glucose-Capillary: 102 mg/dL — ABNORMAL HIGH (ref 70–99)
Glucose-Capillary: 186 mg/dL — ABNORMAL HIGH (ref 70–99)
Glucose-Capillary: 172 mg/dL — ABNORMAL HIGH (ref 70–99)

## 2023-01-14 MED ORDER — DIAZEPAM 2 MG PO TABS
5.0000 mg | ORAL_TABLET | Freq: Once | ORAL | Status: AC | PRN
  Administered 2023-01-14: 5 mg via ORAL
  Filled 2023-01-14: qty 3

## 2023-01-14 MED ORDER — LORAZEPAM 0.5 MG PO TABS: 0.5000 mg | ORAL_TABLET | Freq: Once | ORAL | Status: DC

## 2023-01-14 MED ORDER — GADOBUTROL 1 MMOL/ML IV SOLN
10.0000 mL | Freq: Once | INTRAVENOUS | Status: AC | PRN
  Administered 2023-01-14: 10 mL via INTRAVENOUS

## 2023-01-14 MED ORDER — GERHARDT'S BUTT CREAM
TOPICAL_CREAM | Freq: Every day | CUTANEOUS | Status: DC | PRN
  Filled 2023-01-14 (×2): qty 1

## 2023-01-14 MED ORDER — ADULT MULTIVITAMIN W/MINERALS CH
1.0000 | ORAL_TABLET | Freq: Every day | ORAL | Status: DC
  Administered 2023-01-14 – 2023-01-28 (×15): 1 via ORAL
  Filled 2023-01-14 (×15): qty 1

## 2023-01-14 NOTE — Progress Notes (Signed)
Physical Therapy Treatment Patient Details Name: Jim Ramirez MRN: [id number] DOB: [birth year] Today's Date: 01/14/2023   History of Present Illness 72 yo male admitted 5/28 with back pain with osteonecrosis of lumbar spine L4-5. Rt wrist edema with tenosynovitis, new Afib with RVR. MRI brain negative for acute infarct, but with pyogenic ventriculitis. Bil UE superficial thrombosis. PMhx: ACDF, HTN, T2DM    PT Comments    Pt was seen for progression of movement to move legs and work on gentle ROM and strengthening.  Requires assist for all moving with all joints, but is trying to assist as best he can. Pt is fairly stiff from being in bed much of the time, and will recommend that he work toward being in a recliner out of bed.  Pt is expecting to get to [employer], so will plan to see him there for next visit.  Follow up as stay and condition allows.   Recommendations for follow up therapy are one component of a multi-disciplinary discharge planning process, led by the attending physician.  Recommendations may be updated based on patient status, additional functional criteria and insurance authorization.  Follow Up Recommendations  Can patient physically be transported by private vehicle: No    Assistance Recommended at Discharge Frequent or constant Supervision/Assistance  Patient can return home with the following Assistance with cooking/housework;Assistance with feeding;Help with stairs or ramp for entrance;Assist for transportation;Direct supervision/assist for financial management;A lot of help with bathing/dressing/bathroom;Two people to help with walking and/or transfers   Equipment Recommendations  Hospital bed;Wheelchair (measurements PT);Wheelchair cushion (measurements PT);BSC/3in1    Recommendations for Other Services       Precautions / Restrictions Precautions Precautions: Back;Fall Precaution Booklet Issued: No Precaution Comments: discussed back  precautions Required Braces or Orthoses: Spinal Brace Spinal Brace: Lumbar corset;Applied in sitting position Restrictions Weight Bearing Restrictions: No     Mobility  Bed Mobility Overal bed mobility: Needs Assistance Bed Mobility: Rolling Rolling: Min assist              Transfers Overall transfer level: Needs assistance                 General transfer comment: declines to attempt    Ambulation/Gait                   Stairs             Wheelchair Mobility    Modified Rankin (Stroke Patients Only)       Balance                                            Cognition Arousal/Alertness: Awake/alert Behavior During Therapy: WFL for tasks assessed/performed Overall Cognitive Status: No family/caregiver present to determine baseline cognitive functioning                   Orientation Level: Time       Safety/Judgement: Decreased awareness of deficits   Problem Solving: Slow processing, Decreased initiation General Comments: significant repetitive infomration to move legs esp for direction and to repeat the movement        Exercises General Exercises - Lower Extremity Ankle Circles/Pumps: AAROM, 5 reps Quad Sets: AROM, 10 reps Gluteal Sets: AROM, 10 reps Heel Slides: AAROM, 10 reps Hip ABduction/ADduction: AAROM, 10 reps    General Comments General comments (skin integrity, edema, etc.): Pt was  unable to try to sit up on side of bed but is aware he has back issues.  Did not know precautions      Pertinent Vitals/Pain Pain Assessment Pain Assessment: Faces Faces Pain Scale: Hurts a little bit Pain Location: back    Home Living                          Prior Function            PT Goals (current goals can now be found in the care plan section) Acute Rehab PT Goals Patient Stated Goal: return to walking Progress towards PT goals: Not progressing toward goals - comment     Frequency    Min 1X/week      PT Plan Current plan remains appropriate    Co-evaluation              AM-PAC PT "6 Clicks" Mobility   Outcome Measure  Help needed turning from your back to your side while in a flat bed without using bedrails?: A Little Help needed moving from lying on your back to sitting on the side of a flat bed without using bedrails?: A Lot Help needed moving to and from a bed to a chair (including a wheelchair)?: A Lot Help needed standing up from a chair using your arms (e.g., wheelchair or bedside chair)?: A Lot Help needed to walk in hospital room?: Total Help needed climbing 3-5 steps with a railing? : Total 6 Click Score: 11    End of Session   Activity Tolerance: Patient tolerated treatment well Patient left: in bed;with bed alarm set;with call bell/phone within reach;Other (comment) Nurse Communication: Mobility status PT Visit Diagnosis: Other abnormalities of gait and mobility (R26.89);Muscle weakness (generalized) (M62.81)     Time: 1610-9604 PT Time Calculation (min) (ACUTE ONLY): 27 min  Charges:  $Therapeutic Exercise: 8-22 mins $Therapeutic Activity: 8-22 mins       Ivar Drape 01/14/2023, 3:20 PM  Samul Dada, PT PhD Acute Rehab Dept. Number: [facility] [id number] and [facility] [phone]

## 2023-01-14 NOTE — Progress Notes (Addendum)
Initial Nutrition Assessment  DOCUMENTATION CODES:   Non-severe (moderate) malnutrition in context of acute illness/injury  INTERVENTION:  Liberalize diet to regular with ordering assistance Encourage PO intake Family to bring in food as desired  Discontinue ensure, add Magic cup TID with meals, each supplement provides 290 kcal and 9 grams of protein MVI with minerals daily Mighty Shake TID to provide 330kcal and 9g of carbs per carton  NUTRITION DIAGNOSIS:   Moderate Malnutrition related to acute illness as evidenced by energy intake < 75% for > 7 days, mild muscle depletion, mild fat depletion.  GOAL:   Patient will meet greater than or equal to 90% of their needs  MONITOR:   PO intake, Supplement acceptance, I & O's, Labs, Weight trends  REASON FOR ASSESSMENT:   Malnutrition Screening Tool    ASSESSMENT:   Pt with hx of DM type 2, and HTN presented to outside hospital 5/26 where workup showed MSSA bacteremia and osteonecrosis of the lumbar spine. Transferred to [facility] 5/28 for further workup.  6/1 - FEES, DYS 3, thin liquids 6/4 - TEE showed preserved EF and no vegetation   Pt resting in bed at the time of assessment. Was at MRI during first attempt.   Lunch tray recently arrived, noted that pt ordered chicken tenders and pinto beans. Pt reports that his appetite has been so-so. Documentation of intake in flowsheet is poor. Seems to be a picky eater but also is getting tired of his meal options. Liberalized to regular to provide maximum choices and adjusted to ordering assistance so that preferences could be obtained. Pt does not like the ensure that is ordered, does like ice cream and is agreeable to magic cup  Pt reports that on admission he weighed ~204 lbs. Unsure what he weighs now but bed weight taken shows ~201 lbs. Active at baseline and walks several miles each day per his report. Mild fat and muscle deficits noted throughout exam. Seems to be more related  to poor PO intake this admission and decrease in mobility.   Discussed with pt the importance of maintaining his lean muscle mass. Encouraged him to ask family to bring in foods or snacks that sound more appealing to him.   Average Meal Intake: 6/3-6/15: 15% intake x 8 recorded meals  Nutritionally Relevant Medications: Scheduled Meds:  Ensure Enlive  237 mL Oral BID BM   ferrous sulfate  325 mg Oral Q breakfast   insulin aspart  0-15 Units Subcutaneous TID WC   insulin aspart  0-5 Units Subcutaneous QHS   insulin glargine-yfgn  15 Units Subcutaneous QHS   pantoprazole  40 mg Oral BID   rosuvastatin  20 mg Oral Daily   Continuous Infusions:   ceFAZolin (ANCEF) IV 2 g (01/14/23 0616)   lactated ringers     PRN Meds: ondansetron, senna  Labs Reviewed  NUTRITION - FOCUSED PHYSICAL EXAM: Flowsheet Row Most Recent Value  Orbital Region Mild depletion  Upper Arm Region Mild depletion  Thoracic and Lumbar Region No depletion  Buccal Region Mild depletion  Temple Region Mild depletion  Clavicle Bone Region Mild depletion  Clavicle and Acromion Bone Region Mild depletion  Scapular Bone Region No depletion  Dorsal Hand Mild depletion  Patellar Region Mild depletion  Anterior Thigh Region Mild depletion  Posterior Calf Region Mild depletion  Edema (RD Assessment) None  Hair Reviewed  Eyes Reviewed  Mouth Reviewed  Skin Reviewed  Nails Reviewed    Diet Order:   Diet Order  Diet regular Room service appropriate? Yes with Assist; Fluid consistency: Thin  Diet effective now                   EDUCATION NEEDS:   Education needs have been addressed  Skin:  Skin Assessment: Reviewed RN Assessment Stage 2: bilateral buttocks 1 cm x 1 cm  Last BM:  6/16 - type 6  Height:   Ht Readings from Last 1 Encounters:  01/01/23 6' (1.829 m)    Weight:   Wt Readings from Last 1 Encounters:  01/14/23 91.3 kg    Ideal Body Weight:  80.9 kg  BMI:  Body mass  index is 27.3 kg/m.  Estimated Nutritional Needs:   Kcal:  2000-2200 kcal/d  Protein:  95-115 g/d  Fluid:  >/=2L/d    Greig Castilla, RD, LDN Clinical Dietitian RD pager # available in [facility]  After hours/weekend pager # available in [facility]

## 2023-01-14 NOTE — Progress Notes (Signed)
PROGRESS NOTE    Jim Ramirez  ZOX:[id number] DOB: Nov 02, 1950 DOA: 12/24/2022 PCP: [name], MD   Brief Narrative:  Patient is a 72 year old male with past medical history significant for hypertension and diabetes.  Patient initially presented to [facility] on 12/22/2022 with chronic back pain radiating both her legs, with some shortness of breath that started 1 week prior to admission CT scan of the abdomen pelvis or lumbar spine discitis and osteomyelitis, transferred to [facility] found to be bacteremic with staph aureus with necrosis of the lumbar spine, also in A-fib with RVR, neurology, ID and cardiology were consulted. MRI of the wrist was done that showed fluid collection and surgery was consulted who recommended conservative management. Despite IV antibiotics he continues to spike fevers.  MRI lumbar spine done on 6/7 showed worsening of infection/concerns of epidural abscess but neurosurgery recommending against any surgical intervention.  Initial blood cultures at [facility] grew MSSA.  He was started on antibiotics, surveillance cultures remain negative.  TEE from 6/4 does not show any vegetation but MRI does show small fluid collection of the rest and surgery recommended conservative management.  Infectious disease recommended IV nafcillin from negative cultures on 5/28 till 01/10/2023, then switched to cefazolin 2 g IV every 8 hourly for additional 6 weeks, thereafter cefadroxil 1000 mg twice daily for 10 months plus.    01/13/2023: Patient seen.  No new changes.  Neurosurgery input is appreciated.  Continue IV antibiotics as per infectious disease team.  01/14/2023: Patient seen.  Neurosurgery input is appreciated.  MRI lumbar with and without contrast revealed: 1. Compared with the previous MRI from 01/03/2023, there is progressive paraspinal inflammatory changes with peripherally enhancing fluid collections in both psoas muscles consistent with paraspinal abscesses. 2. There are  progressive heterogeneous marrow signal changes within the L4 and L5 vertebral bodies consistent with osteonecrosis. As previously noted, there is L4-5 discal T2 hyperintensity without endplate destruction or abnormal enhancement as typically seen with discitis. However, the osseous changes and surrounding soft tissue findings are concerning for superimposed infection. 3. Enlarging ventral epidural fluid collection extending from T12 through the upper sacrum with peripheral enhancement, suspicious for an epidural abscess. There is increasing mass effect on the thecal sac, greatest at L4-5 where there is moderate to severe spinal stenosis. 4. Persistent evidence of septic arthritis at the right L4-5 facet joint. 5. New mild endplate edema and enhancement in the inferior endplate of L1 without evidence of adjacent disc abnormality, likely reactive. 6. Distended urinary bladder.   Assessment & Plan:  Principal Problem:   Discitis of lumbar region Active Problems:   Diabetes mellitus type 2 in nonobese (HCC)   Dyslipidemia   Essential hypertension   New onset atrial fibrillation (HCC)   Sepsis (HCC)   BPH (benign prostatic hyperplasia)   Mood disorder (HCC)   MSSA bacteremia   Malnutrition of moderate degree    Severe sepsis secondary to persistent vertebral osteonecrosis/infarct of L4-L-L5 with discitis/septic arthritis and multiple loculated fluid collection around psoas muscle MSSA bacteremia -Initial blood cultures grew MSSA. -Sepsis physiology has resolved. -Infectious disease team is directing antibiotics.  Please see above.  Patient will need to be on antibiotics for a very long time. -Guarded prognosis.  -MRI lumbar spine shows lumbar discitis, osteomyelitis and epidural abscess-unfortunately due to poor prognosis no further surgical options from neurosurgery 01/13/2023: Complete course of antibiotics.  Pursue LTAC placement 01/14/2023: Repeat MRI lumbar finding is as  documented above.   left wrist edema/concern for  tenosynovitis: -Conservative management per orthopedic. MRI shows persistent and worsening abscess of the right wrist, [facility] was consulted, status post bedside aspiration 6/10 but not enough sample.  Grossly did not appear purulent per their documentation 01/14/2023: IV antibiotics as above.  Acute kidney injury: Baseline creatinine 0.7, resolved.  Creatinine today 1.0 > 1.33.  Clinically getting dehydrated therefore we will give gentle fluids 01/14/2023: Resolved.  Serum creatinine is 0.99 today.    Osteonecrosis of L4-L5: -Pain control.  Not a surgical candidate   New onset atrial fibrillation: -Currently on amiodarone, Lovenox every 12 hours.  Iron deficiency anemia:  Iron supplements with bowel regimen   Hypokalemia:  -Improved significantly. -Potassium is 3.5 today.   Acute thrombocytopenia: Resolved   Elevated LFTs and hyperbilirubinemia: Stable   Elevate troponin: -Likely demand ischemia.   -No chest pain.     BPH: -Continue Flomax.   Diabetes mellitus type 2 with hyperglycemia: -A1c of 8.1. Sliding scale and Accu-Chek.  Semglee 15 units at bedtime   Obstructive sleep apnea: -Continue CPAP at night.   Hyperlipidemia:  -continue statins.   Mood disorder: -Continue Lexapro.  Bedtime Risperdal   Moderate protein caloric malnutrition: -Noted.  Patient has already been seen by palliative care.  Currently DNR but continue full treatment Ongoing discussions by palliative regarding goals of care. Overall very poor prognosis     DVT prophylaxis: Heparin Code Status: DNR Family Communication:  None present at bedside.  Will require long-term IV antibiotics.  Will discuss with TOC regarding disposition Will start having DOC explore options regarding long-term placement such as SNF/LTAC for IV antibiotics  Pressure Injury 01/03/23 Buttocks Right;Left Stage 2 -  Partial thickness loss of dermis presenting as  a shallow open injury with a red, pink wound bed without slough. 1x1 (Active)  01/03/23 2100  Location: Buttocks  Location Orientation: Right;Left  Staging: Stage 2 -  Partial thickness loss of dermis presenting as a shallow open injury with a red, pink wound bed without slough.  Wound Description (Comments): 1x1  Present on Admission: No     Diet Orders (From admission, onward)     Start     Ordered   01/14/23 0754  Diet regular Room service appropriate? Yes with Assist; Fluid consistency: Thin  Diet effective now       Question Answer Comment  Room service appropriate? Yes with Assist   Fluid consistency: Thin      01/14/23 0753            Subjective: Seen at bedside.  Denies any complaints.  Overall appears very weak.  Generally poor nutrition  Examination:  Constitutional: Awake.  Not in any distress.  Chronically ill looking.   Lungs: Decreased air entry. Cardiovascular: S1-S2. Abdomen: Soft and nontender.  Neurologic: Awake and alert. Extremities: No leg edema. . Objective: Vitals:   01/14/23 0402 01/14/23 0728 01/14/23 0812 01/14/23 1241  BP: 134/66 118/62    Pulse:      Resp: (!) 23 (!) 21 20   Temp: 97.7 F (36.5 C) 97.8 F (36.6 C)    TempSrc: Oral Oral    SpO2: 94% 96% 96%   Weight:    91.3 kg  Height:        Intake/Output Summary (Last 24 hours) at 01/14/2023 1617 Last data filed at 01/14/2023 0431 Gross per 24 hour  Intake 180.46 ml  Output 800 ml  Net -619.54 ml    Filed Weights   01/01/23 1500 01/05/23 0346 01/14/23 1241  Weight:  93 kg 86.5 kg 91.3 kg    Scheduled Meds:  acetaminophen  500 mg Oral TID   amiodarone  200 mg Oral Daily   apixaban  5 mg Oral BID   azelastine  1 spray Each Nare BID   [START ON 02/19/2023] cefadroxil  1,000 mg Oral BID   Chlorhexidine Gluconate Cloth  6 each Topical Daily   escitalopram  10 mg Oral Daily   ferrous sulfate  325 mg Oral Q breakfast   fluticasone  2 spray Each Nare Daily   gabapentin  100  mg Oral BID   insulin aspart  0-15 Units Subcutaneous TID WC   insulin aspart  0-5 Units Subcutaneous QHS   insulin glargine-yfgn  15 Units Subcutaneous QHS   ketotifen  1 drop Left Eye BID   lidocaine  2 patch Transdermal Q24H   loratadine  10 mg Oral Daily   LORazepam  0.5 mg Oral Once   melatonin  3 mg Oral QHS   methocarbamol  500 mg Oral BID   multivitamin with minerals  1 tablet Oral Daily   pantoprazole  40 mg Oral BID   risperiDONE  0.5 mg Oral QHS   rosuvastatin  20 mg Oral Daily   sodium chloride flush  10-40 mL Intracatheter Q12H   sodium chloride flush  3 mL Intravenous Q12H   tamsulosin  0.4 mg Oral Daily   Continuous Infusions:   ceFAZolin (ANCEF) IV 2 g (01/14/23 1317)   lactated ringers      Nutritional status Signs/Symptoms: energy intake < 75% for > 7 days, mild muscle depletion, mild fat depletion Interventions: Refer to RD note for recommendations Body mass index is 27.3 kg/m.  Data Reviewed:   CBC: Recent Labs  Lab 01/08/23 0440 01/09/23 0610 01/10/23 0447 01/11/23 0445 01/12/23 0405  WBC 7.2 6.9 7.0 8.7 9.0  HGB 8.8* 8.6* 8.4* 8.6* 8.5*  HCT 28.8* 28.6* 28.0* 28.5* 28.4*  MCV 84.7 85.6 84.3 85.6 83.8  PLT 317 363 316 308 278    Basic Metabolic Panel: Recent Labs  Lab 01/08/23 0440 01/09/23 0610 01/10/23 0447 01/11/23 0445 01/12/23 0405  NA 139 139 139 139 138  K 3.3* 3.2* 2.8* 3.8 3.5  CL 98 102 104 102 106  CO2 28 27 27 25 25   GLUCOSE 172* 152* 104* 187* 136*  BUN 26* 25* 22 23 21   CREATININE 1.33* 1.24 1.21 1.26* 0.99  CALCIUM 8.0* 8.1* 8.1* 8.4* 8.3*  MG 2.0 2.1 2.0 2.0 2.0  PHOS  --   --   --  4.1  --     GFR: Estimated Creatinine Clearance: 76.2 mL/min (by C-G formula based on SCr of 0.99 mg/dL). Liver Function Tests: Recent Labs  Lab 01/11/23 0445  ALBUMIN <1.5*     No results for input(s): "LIPASE", "AMYLASE" in the last 168 hours. No results for input(s): "AMMONIA" in the last 168 hours. Coagulation  Profile: No results for input(s): "INR", "PROTIME" in the last 168 hours. Cardiac Enzymes: No results for input(s): "CKTOTAL", "CKMB", "CKMBINDEX", "TROPONINI" in the last 168 hours. BNP (last 3 results) No results for input(s): "PROBNP" in the last 8760 hours. HbA1C: No results for input(s): "HGBA1C" in the last 72 hours. CBG: Recent Labs  Lab 01/13/23 1125 01/13/23 1648 01/13/23 2128 01/14/23 0614 01/14/23 1301  GLUCAP 128* 214* 134* 102* 186*    Lipid Profile: No results for input(s): "CHOL", "HDL", "LDLCALC", "TRIG", "CHOLHDL", "LDLDIRECT" in the last 72 hours. Thyroid Function Tests: No results  for input(s): "TSH", "T4TOTAL", "FREET4", "T3FREE", "THYROIDAB" in the last 72 hours. Anemia Panel: No results for input(s): "VITAMINB12", "FOLATE", "FERRITIN", "TIBC", "IRON", "RETICCTPCT" in the last 72 hours.  Sepsis Labs: No results for input(s): "PROCALCITON", "LATICACIDVEN" in the last 168 hours.  No results found for this or any previous visit (from the past 240 hour(s)).       Radiology Studies: MR Lumbar Spine W Wo Contrast  Result Date: 01/14/2023 CLINICAL DATA:  Back pain extending into both lower extremities. Sepsis. Previous studies demonstrating findings suspicious for L4 and L5 osteonecrosis with possible superimposed infection. Clinical concern for infection. EXAM: MRI LUMBAR SPINE WITHOUT AND WITH CONTRAST TECHNIQUE: Multiplanar and multiecho pulse sequences of the lumbar spine were obtained without and with intravenous contrast. CONTRAST:  10mL GADAVIST GADOBUTROL 1 MMOL/ML IV SOLN COMPARISON:  Lumbar MRI 01/03/2023 and 12/25/2022. FINDINGS: Segmentation: Conventional anatomy assumed, with the last open disc space designated L5-S1.Concordant with prior imaging. Alignment:  Physiologic. Vertebrae: Progressive heterogeneous marrow signal abnormalities are identified throughout the L4 and L5 vertebral bodies. There is heterogeneous low T1 and high T2 signal. Relatively  limited enhancement of the marrow in these vertebral bodies is seen following contrast, again consistent with osteonecrosis. There are some serpiginous areas of peripheral enhancement within both vertebral bodies. As previously noted, there is T2 hyperintensity within the L4-5 disc without suspicious enhancement or endplate destruction. New mild endplate edema and enhancement in the inferior endplate of L1. Persistent changes of probable septic arthritis at the right L4-5 facet joint. The visualized sacroiliac joints appear unremarkable. Conus medullaris: Extends to the L1 level and appears normal. Progressive abnormality of the ventral epidural space now extending from T12 through the upper sacrum with peripheral enhancement, suspicious for an epidural abscess. Paraspinal and other soft tissues: There are progressive paraspinal inflammatory changes with peripherally enhancing fluid collections in both psoas muscles, measuring up to 2.5 cm on the left (image 34/9). As above, periarticular enhancement associated with the right facet joint at L4-5 consistent with septic arthritis. The urinary bladder is markedly distended. Disc levels: Sagittal images demonstrate no significant disc space findings within the visualized lower thoracic spine. L1-2: Stable loss of disc height with disc bulging and endplate osteophytes. Mild facet hypertrophy. Progressive superior extension of the ventral epidural fluid collection to this level. No significant spinal stenosis or nerve root encroachment. L2-3: Stable loss of disc height with annular disc bulging and endplate osteophytes. Progressive ventral epidural fluid collection at this level with mild mass effect on the thecal sac. Similar moderate spinal stenosis with lateral recess narrowing bilaterally. L3-4: Stable chronic loss of disc height with annular disc bulging and endplate osteophytes. The anterior epidural collection at this level has also mildly enlarged. Resulting  moderate spinal stenosis with asymmetric narrowing of the right lateral recess. L4-5: As above, persistent discal T2 hyperintensity without endplate destruction or abnormal enhancement in the disc. Enlarging ventral epidural fluid collection with increasing mass effect on the thecal sac and increasing paraspinal inflammatory changes and ill-defined fluid collections as described above. Moderate to severe spinal stenosis with lateral recess and foraminal narrowing bilaterally. As above, persistent evidence of septic arthritis at the right facet joint. L5-S1: Stable loss of disc height with annular disc bulging and endplate osteophytes. Enlarging ventral epidural fluid collection extending posterior to the upper sacrum with increased mass effect on the sacral spinal canal. Mild foraminal narrowing bilaterally. IMPRESSION: 1. Compared with the previous MRI from 01/03/2023, there is progressive paraspinal inflammatory changes with peripherally enhancing fluid  collections in both psoas muscles consistent with paraspinal abscesses. 2. There are progressive heterogeneous marrow signal changes within the L4 and L5 vertebral bodies consistent with osteonecrosis. As previously noted, there is L4-5 discal T2 hyperintensity without endplate destruction or abnormal enhancement as typically seen with discitis. However, the osseous changes and surrounding soft tissue findings are concerning for superimposed infection. 3. Enlarging ventral epidural fluid collection extending from T12 through the upper sacrum with peripheral enhancement, suspicious for an epidural abscess. There is increasing mass effect on the thecal sac, greatest at L4-5 where there is moderate to severe spinal stenosis. 4. Persistent evidence of septic arthritis at the right L4-5 facet joint. 5. New mild endplate edema and enhancement in the inferior endplate of L1 without evidence of adjacent disc abnormality, likely reactive. 6. Distended urinary bladder.  Electronically Signed   By: Carey Bullocks M.D.   On: 01/14/2023 15:40           LOS: 21 days   Time spent= 35 mins    Barnetta Chapel, MD [facility]  If 7PM-7AM, please contact night-coverage  01/14/2023, 4:17 PM

## 2023-01-14 NOTE — Progress Notes (Addendum)
Subjective: The patient is alert and pleasant.  He continues to complain of back pain with pain into his legs.  Objective: Vital signs in last 24 hours: Temp:  [97.7 F (36.5 C)-98.1 F (36.7 C)] 97.8 F (36.6 C) (06/18 0728) Pulse Rate:  [79] 79 (06/17 2015) Resp:  [18-23] 20 (06/18 0812) BP: (113-134)/(62-80) 118/62 (06/18 0728) SpO2:  [94 %-97 %] 96 % (06/18 0812) Weight:  [91.3 kg] 91.3 kg (06/18 1241) Estimated body mass index is 27.3 kg/m as calculated from the following:   Height as of this encounter: 6' (1.829 m).   Weight as of this encounter: 91.3 kg.   Intake/Output from previous day: 06/17 0701 - 06/18 0700 In: 208.1 [IV Piggyback:208.1] Out: 800 [Urine:800] Intake/Output this shift: No intake/output data recorded.  Physical exam patient is alert and pleasant.  His strength is normal in his bilateral gastrocnemius.  He has weakness in bilateral EHLs at 4+/5.  I reviewed the patient's follow-up with lumbar MRI performed today.  He has continued hyperintensity of his L4 and L5 vertebral bodies.  There is septic arthritis in his right L4-5 facet.  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  There is spinal stenosis most prominent at L4-5.  Lab Results: Recent Labs    01/12/23 0405  WBC 9.0  HGB 8.5*  HCT 28.4*  PLT 278   BMET Recent Labs    01/12/23 0405  NA 138  K 3.5  CL 106  CO2 25  GLUCOSE 136*  BUN 21  CREATININE 0.99  CALCIUM 8.3*    Studies/Results: MR Lumbar Spine W Wo Contrast  Result Date: 01/14/2023 CLINICAL DATA:  Back pain extending into both lower extremities. Sepsis. Previous studies demonstrating findings suspicious for L4 and L5 osteonecrosis with possible superimposed infection. Clinical concern for infection. EXAM: MRI LUMBAR SPINE WITHOUT AND WITH CONTRAST TECHNIQUE: Multiplanar and multiecho pulse sequences of the lumbar spine were obtained without and with intravenous contrast. CONTRAST:  10mL GADAVIST  GADOBUTROL 1 MMOL/ML IV SOLN COMPARISON:  Lumbar MRI 01/03/2023 and 12/25/2022. FINDINGS: Segmentation: Conventional anatomy assumed, with the last open disc space designated L5-S1.Concordant with prior imaging. Alignment:  Physiologic. Vertebrae: Progressive heterogeneous marrow signal abnormalities are identified throughout the L4 and L5 vertebral bodies. There is heterogeneous low T1 and high T2 signal. Relatively limited enhancement of the marrow in these vertebral bodies is seen following contrast, again consistent with osteonecrosis. There are some serpiginous areas of peripheral enhancement within both vertebral bodies. As previously noted, there is T2 hyperintensity within the L4-5 disc without suspicious enhancement or endplate destruction. New mild endplate edema and enhancement in the inferior endplate of L1. Persistent changes of probable septic arthritis at the right L4-5 facet joint. The visualized sacroiliac joints appear unremarkable. Conus medullaris: Extends to the L1 level and appears normal. Progressive abnormality of the ventral epidural space now extending from T12 through the upper sacrum with peripheral enhancement, suspicious for an epidural abscess. Paraspinal and other soft tissues: There are progressive paraspinal inflammatory changes with peripherally enhancing fluid collections in both psoas muscles, measuring up to 2.5 cm on the left (image 34/9). As above, periarticular enhancement associated with the right facet joint at L4-5 consistent with septic arthritis. The urinary bladder is markedly distended. Disc levels: Sagittal images demonstrate no significant disc space findings within the visualized lower thoracic spine. L1-2: Stable loss of disc height with disc bulging and endplate osteophytes. Mild facet hypertrophy. Progressive superior extension of the ventral epidural fluid  collection to this level. No significant spinal stenosis or nerve root encroachment. L2-3: Stable loss of  disc height with annular disc bulging and endplate osteophytes. Progressive ventral epidural fluid collection at this level with mild mass effect on the thecal sac. Similar moderate spinal stenosis with lateral recess narrowing bilaterally. L3-4: Stable chronic loss of disc height with annular disc bulging and endplate osteophytes. The anterior epidural collection at this level has also mildly enlarged. Resulting moderate spinal stenosis with asymmetric narrowing of the right lateral recess. L4-5: As above, persistent discal T2 hyperintensity without endplate destruction or abnormal enhancement in the disc. Enlarging ventral epidural fluid collection with increasing mass effect on the thecal sac and increasing paraspinal inflammatory changes and ill-defined fluid collections as described above. Moderate to severe spinal stenosis with lateral recess and foraminal narrowing bilaterally. As above, persistent evidence of septic arthritis at the right facet joint. L5-S1: Stable loss of disc height with annular disc bulging and endplate osteophytes. Enlarging ventral epidural fluid collection extending posterior to the upper sacrum with increased mass effect on the sacral spinal canal. Mild foraminal narrowing bilaterally. IMPRESSION: 1. Compared with the previous MRI from 01/03/2023, there is progressive paraspinal inflammatory changes with peripherally enhancing fluid collections in both psoas muscles consistent with paraspinal abscesses. 2. There are progressive heterogeneous marrow signal changes within the L4 and L5 vertebral bodies consistent with osteonecrosis. As previously noted, there is L4-5 discal T2 hyperintensity without endplate destruction or abnormal enhancement as typically seen with discitis. However, the osseous changes and surrounding soft tissue findings are concerning for superimposed infection. 3. Enlarging ventral epidural fluid collection extending from T12 through the upper sacrum with  peripheral enhancement, suspicious for an epidural abscess. There is increasing mass effect on the thecal sac, greatest at L4-5 where there is moderate to severe spinal stenosis. 4. Persistent evidence of septic arthritis at the right L4-5 facet joint. 5. New mild endplate edema and enhancement in the inferior endplate of L1 without evidence of adjacent disc abnormality, likely reactive. 6. Distended urinary bladder. Electronically Signed   By: Carey Bullocks M.D.   On: 01/14/2023 15:40    Assessment/Plan: Lumbar discitis, osteomyelitis, epidural abscess, stenosis: I discussed the situation with the patient.  Unfortunately his skin continues to worsen despite IV antibiotics.  We discussed the various treatment options.  We discussed L2-3, L3-4, L4-5 laminotomy to drain his epidural abscess.  Hopefully this will provide relief of his spinal stenosis and some pain relief.  I described the surgery and the risk including risk of anesthesia, hemorrhage, infection, spinal fluid leak, nerve injury, medical risks, etc..  Ultimately he may require instrumentation and fusion but not in the setting of acute infection.  I have answered all his questions.  He wants to proceed with surgery.  I will discontinue his Eliquis and we will plan to do it on Thursday.  LOS: 21 days     Cristi Loron 01/14/2023, 5:17 PM     Patient ID: Jim Ramirez, male   DOB: [birth year], 72 y.o.   MRN: [id number]

## 2023-01-15 ENCOUNTER — Encounter (HOSPITAL_COMMUNITY)
Disposition: A | Discharge status: Skilled Nursing Facility | Payer: Self-pay | Source: Other Acute Inpatient Hospital | Attending: Internal Medicine

## 2023-01-15 ENCOUNTER — Encounter (HOSPITAL_COMMUNITY): Payer: Self-pay | Admitting: Certified Registered Nurse Anesthetist

## 2023-01-15 DIAGNOSIS — M4646 Discitis, unspecified, lumbar region: Secondary | ICD-10-CM | POA: Diagnosis not present

## 2023-01-15 LAB — GLUCOSE, CAPILLARY
Glucose-Capillary: 149 mg/dL — ABNORMAL HIGH (ref 70–99)
Glucose-Capillary: 193 mg/dL — ABNORMAL HIGH (ref 70–99)
Glucose-Capillary: 297 mg/dL — ABNORMAL HIGH (ref 70–99)
Glucose-Capillary: 217 mg/dL — ABNORMAL HIGH (ref 70–99)

## 2023-01-15 SURGERY — LUMBAR LAMINECTOMY/DECOMPRESSION MICRODISCECTOMY 3 LEVELS
Anesthesia: General

## 2023-01-15 MED ORDER — ENOXAPARIN SODIUM 100 MG/ML IJ SOSY
90.0000 mg | PREFILLED_SYRINGE | Freq: Two times a day (BID) | INTRAMUSCULAR | Status: DC
  Administered 2023-01-16 – 2023-01-17 (×3): 90 mg via SUBCUTANEOUS
  Filled 2023-01-15 (×4): qty 0.9

## 2023-01-15 MED ORDER — ENOXAPARIN SODIUM 100 MG/ML IJ SOSY
90.0000 mg | PREFILLED_SYRINGE | INTRAMUSCULAR | Status: AC
  Administered 2023-01-15: 90 mg via SUBCUTANEOUS
  Filled 2023-01-15: qty 0.9

## 2023-01-15 NOTE — Progress Notes (Signed)
Palliative Medicine Progress Note   Patient Name: Jim Ramirez       Date: 01/15/2023 DOB: [birth year]  Age: 72 y.o. MRN#: [id number] Attending Physician: [name], MD Primary Care Physician: [name], MD Admit Date: 12/24/2022  Reason for Consultation/Follow-up: {Reason for Consult:23484}  HPI/Patient Profile: 72 y.o. male  with past medical history of DM and HTN who initially presented to [facility] on 12/22/2022 with complaint of back pain with radiation down both legs.  He was found to have MSSA bacteremia and osteonecrosis of the lumbar spine.  He was also found to have new onset A-fib with RVR.  He was transferred to [facility] on 12/24/2022 for further management of severe sepsis.  Palliative Medicine was consulted for goals of care conversations.  Subjective: ***  Objective:  Physical Exam          Vital Signs: BP 134/66 (BP Location: Right Arm)   Pulse 92   Temp 98.6 F (37 C) (Oral)   Resp 19   Ht 6' (1.829 m)   Wt 91.3 kg Comment: bed weight  SpO2 100%   BMI 27.30 kg/m  SpO2: SpO2: 100 % O2 Device: O2 Device: Room Air O2 Flow Rate: O2 Flow Rate (L/min): 2 L/min  Intake/output summary:  Intake/Output Summary (Last 24 hours) at 01/15/2023 2042 Last data filed at 01/15/2023 2040 Gross per 24 hour  Intake 1100.46 ml  Output 1900 ml  Net -799.54 ml    LBM: Last BM Date : 01/14/23     Palliative Assessment/Data: ***     Palliative Medicine Assessment & Plan   Assessment: Principal Problem:   Discitis of lumbar region Active Problems:   Diabetes mellitus type 2 in nonobese (HCC)   Dyslipidemia   Essential hypertension   New onset atrial fibrillation (HCC)   Sepsis (HCC)   BPH (benign prostatic hyperplasia)   Mood disorder (HCC)   MSSA bacteremia    Malnutrition of moderate degree    Recommendations/Plan: ***  Goals of Care and Additional Recommendations: Limitations on Scope of Treatment: {Recommended Scope and Preferences:21019}  Code Status:   Prognosis:  {Palliative Care Prognosis:23504}  Discharge Planning: {Palliative dispostion:23505}  Care plan was discussed with ***  Thank you for allowing the Palliative Medicine Team to assist in the care of this patient.   ***  Lavena Bullion, NP   Please contact Palliative Medicine Team phone at [phone] for questions and concerns.  For individual providers, please see AMION.

## 2023-01-15 NOTE — Progress Notes (Signed)
[facility] PROGRESS NOTE  Jim Ramirez (DOB: 06/30/1951) ZOX:[id number] PCP: Jim Peri, MD  Brief Narrative: Jim Ramirez is a 72 y.o. male with a history of hypertension and diabetes initially presented to [facility] on 12/22/2022 for chronic back pain radiating both her legs, with some shortness of breath that started 1 week prior to admission CT scan of the abdomen pelvis or lumbar spine discitis and osteomyelitis, transferred to [facility] found to be bacteremic with staph aureus with necrosis of the lumbar spine, also in A-fib with RVR, neurology, ID and cardiology were consulted. MRI of the wrist was done that showed fluid collection and surgery was consulted who recommended conservative management. Despite IV antibiotics he continues to spike fevers.  MRI lumbar spine done on 6/7 showed worsening of infection/concerns of epidural abscess but neurosurgery recommending against any surgical intervention. Initial blood cultures at [facility] grew MSSA. TEE from 6/4 does not show any vegetation but MRI does show small fluid collection, CT surgery recommended conservative management.  Infectious disease recommended IV nafcillin from negative cultures on 5/28 until 01/10/2023, now switched to cefazolin 2 g IV every 8 hourly for additional 6 weeks, thereafter cefadroxil 1000 mg twice daily (starting 7/24) for at least 10 months.   Due to progressive low back pain radiating down legs, MRI repeated 6/7 showing persistent L4, L5 osteonecrosis, probable L4/5 discitis with right L4-5 septic facet, multiple psoas intramuscular loculated fluid collections. Despite continued IV antibiotics, a repeat MRI 6/18 showing new epidural abscess and stenosis. Neurosurgery discussed risks and benefits of surgery at length with the patient who opts to proceed with surgery after eliquis washout on 6/24.   Subjective: Pain better controlled, moving extremities. Brother and nephew at bedside. No new complaints.    Objective: BP (!) 146/67 (BP Location: Right Arm)   Pulse 90   Temp 98.6 F (37 C) (Oral)   Resp 18   Ht 6' (1.829 m)   Wt 91.3 kg Comment: bed weight  SpO2 98%   BMI 27.30 kg/m   Gen: Older gentleman in no distress Pulm: Clear, diminished, nonlabored, 98% on room air with conversation  CV: RRR, soft holosystolic murmur at USB, trace BL LE edema. GI: Soft, NT, ND, +BS Neuro: Alert and oriented. Diminished strength bilateral EHL, ankle extension/flexion near normal. No new focal deficits. Ext: Warm, no deformities. Skin: No new rashes, lesions or ulcers on visualized skin   Assessment & Plan: Severe sepsis secondary to persistent vertebral osteonecrosis/infarct of L4-L-L5 with discitis/septic arthritis and multiple loculated fluid collection around psoas muscle MSSA bacteremia: He has continued hyperintensity of his L4 and L5 vertebral bodies. There is septic arthritis in his right L4-5 facet. 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 There is spinal stenosis most prominent at L4-5.  -Initial blood cultures grew MSSA. -Sepsis physiology has resolved. - ID has placed antibiotic orders, continuing ancef currently, transition to cefadroxil 7/24.  - Guarded prognosis.   - Neurosurgery, Dr. Lovell Sheehan, planning surgery on Monday after eliquis washout.   Left wrist edema/concern for tenosynovitis: - MRI shows persistent and worsening abscess of the right wrist, [facility] was consulted, status post bedside aspiration 6/10 but not enough sample.  Grossly did not appear purulent per their documentation. Planning to continue antibiotics.   Acute kidney injury: Resolved. - Recheck   Osteonecrosis of L4-L5: - Analgesia.    New onset atrial fibrillation: - Continue amiodarone, now down to 200mg  daily - Holding eliquis in preparation for  neurosurgery, will restart lovenox.     Iron deficiency anemia:  - Continue iron supplements with bowel regimen    Hypokalemia: Improved.  - No labs since 6/15, will recheck.    Acute thrombocytopenia: Likely due to sepsis, resolved durably.    Elevated LFTs and hyperbilirubinemia: Stable   Elevate troponin: -Likely demand ischemia.   -No chest pain.     BPH: -Continue Flomax.   Diabetes mellitus type 2 with hyperglycemia: -A1c of 8.1. Sliding scale and Accu-Chek.  Semglee 15 units at bedtime   Obstructive sleep apnea: -Continue CPAP at night.   Hyperlipidemia:  -continue statins.   Mood disorder: -Continue Lexapro.  Bedtime Risperdal   Moderate protein caloric malnutrition: -Noted.   Goals of care: Patient has already been seen by palliative care.  Currently DNR but continue full treatment Ongoing discussions by palliative regarding goals of care. Overall very poor prognosis, though the only chance at an acceptable quality of life to the patient would require neurosurgery which is planned.   [name], MD [facility] [email or web address] 01/15/2023, 1:09 PM

## 2023-01-15 NOTE — Progress Notes (Signed)
ANTICOAGULATION CONSULT NOTE Pharmacy Consult for  Enoxaparin>>apixaban>>lovenox Indication: atrial fibrillation  No Known Allergies  Patient Measurements: Height: 6' (182.9 cm) Weight: 91.3 kg (201 lb 4.5 oz) (bed weight) IBW/kg (Calculated) : 77.6 Heparin Dosing Weight: 93kg  Vital Signs: Temp: 98.6 F (37 C) (06/19 1109) Temp Source: Oral (06/19 1109) BP: 146/67 (06/19 1109) Pulse Rate: 90 (06/19 1109)  Labs: No results for input(s): "HGB", "HCT", "PLT", "APTT", "LABPROT", "INR", "HEPARINUNFRC", "HEPRLOWMOCWT", "CREATININE", "CKTOTAL", "CKMB", "TROPONINIHS" in the last 72 hours.   Estimated Creatinine Clearance: 76.2 mL/min (by C-G formula based on SCr of 0.99 mg/dL).  Assessment: 72 y.o. male with new-onset AFib with RVR in the setting of MSSA bacteremia and lumbar spine osteomyelitis and pyogenic ventriculitis. Patient not taking any anticoagulation prior to admission. Pharmacy initially consulted to dose heparin infusion for AFib.  Then on 01/03/23 Heparin was changed to Enoxaparin treatment dosing after pharmacist discussion with MD due to concern for possible heparin resistance.     Continues on Enoxaparin 90 mg SQ q12h for new onset Afib.  Dose is ~ 1 mg/kg q12h. Weight 86.5 kg  Hgb 9s, Plt wnl - stable in 300s. Iron defic anemia, on po iron supplement daily w/ bowel regimen as needed.  12/30/22 Vasc US BUE duplex:  bilateral UE acute superficial thrombus;No evidence of DVT.  Renal: AKI, F/u SCr trending down , 1.21 today.  CrCl estimated 62.4 ml/hr.  Current enoxaparin dose is appropriate.   Follow up on plan for transition to oral Anticoagulant .  30 day cost of apixaban or rivaroxaban is $11.20/month   Neurosurgery plan to do washout on Monday. Plan to bridge with Lovenox until Sunday morning. Dr. Jarvis Newcomer agreed.  Goal of Therapy:  antiXa level: 0.6-1 unit/ml  peak level (4 hours after a dose) Monitor PLTC    Plan:  Dc apixaban Lovenox 90mg  BID until Sunday  morning Of note, copay cost of apixaban or rivaroxaban is $11.20/month F/u resume post surgery on Monday  Jim Ramirez, PharmD, [place], AAHIVP, CPP Infectious Disease Pharmacist 01/15/2023 2:06 PM

## 2023-01-15 NOTE — Progress Notes (Signed)
Palliative Medicine Progress Note   Patient Name: Jim Ramirez       Date: 01/15/2023 DOB: 1951-07-08  Age: 72 y.o. MRN#: [id number] Attending Physician: [name], MD Primary Care Physician: [name], MD Admit Date: 12/24/2022    HPI/Patient Profile: 72 y.o. male  with past medical history of DM and HTN who initially presented to [facility] on 12/22/2022 with complaint of back pain with radiation down both legs.  He was found to have MSSA bacteremia and osteonecrosis of the lumbar spine.  He was also found to have new onset A-fib with RVR.  He was transferred to [facility] on 12/24/2022 for further management of severe sepsis.  Palliative Medicine was consulted for goals of care conversations.  Subjective: Chart reviewed, update received from RN, and patient assessed at bedside.  He has just returned to the room from having MRI spine.  Results are pending. Patient reports that his pain is well-controlled at the present time.  Briefly discussed with Dr. Dartha Lodge regarding patient/family's request for a second opinion from neurosurgery.  There are no updates at this time.   Objective:  Physical Exam Vitals reviewed.  Constitutional:      General: He is not in acute distress.    Appearance: He is ill-appearing.  Pulmonary:     Effort: Pulmonary effort is normal.  Neurological:     Mental Status: He is alert and oriented to person, place, and time.     Motor: Weakness present.             Palliative Medicine Assessment & Plan   Assessment: Principal Problem:   Discitis of lumbar region Active Problems:   Diabetes mellitus type 2 in nonobese [facility])   Dyslipidemia   Essential hypertension   New onset atrial fibrillation (HCC)   Sepsis (HCC)   BPH (benign prostatic  hyperplasia)   Mood disorder (HCC)   MSSA bacteremia   Malnutrition of moderate degree    Recommendations/Plan:  Per GOC meeting 6/16, patient and family are ultimately hopeful for improvement.  They are also requesting a second opinion from neurosurgery from an outside healthcare system.  PMT will continue to follow   Code Status: DNR   Prognosis:  Very guarded  Discharge Planning: To Be Determined    Thank you for allowing the Palliative Medicine Team  to assist in the care of this patient.   MDM - low   Merry Proud, NP   Please contact Palliative Medicine Team phone at [phone] for questions and concerns.  For individual providers, please see AMION.

## 2023-01-15 NOTE — TOC Progression Note (Signed)
Transition of Care [facility]) - Progression Note    Patient Details  Name: Jim Ramirez MRN: [id number] Date of Birth: 04-08-1951  Transition of Care [facility]) CM/SW Contact  Harriet Masson, RN Phone Number: 01/15/2023, 12:21 PM  Clinical Narrative:     Patient scheduled for lumbar laminectomy Monday 01/20/23. TOC following.        Expected Discharge Plan and Services                                               Social Determinants of Health (SDOH) Interventions SDOH Screenings   Transportation Needs: No Transportation Needs (12/26/2022)  Utilities: Not At Risk (12/26/2022)  Tobacco Use: Medium Risk (01/01/2023)    Readmission Risk Interventions     No data to display

## 2023-01-15 NOTE — Progress Notes (Signed)
Subjective: The patient is alert and pleasant.  He is in no apparent distress.  Objective: Vital signs in last 24 hours: Temp:  [97.7 F (36.5 C)-98.7 F (37.1 C)] 98.4 F (36.9 C) (06/19 0840) Pulse Rate:  [82-96] 91 (06/19 0840) Resp:  [15-23] 19 (06/19 0840) BP: (110-130)/(63-68) 123/68 (06/19 0840) SpO2:  [92 %-100 %] 96 % (06/19 0840) Weight:  [91.3 kg] 91.3 kg (06/18 1241) Estimated body mass index is 27.3 kg/m as calculated from the following:   Height as of this encounter: 6' (1.829 m).   Weight as of this encounter: 91.3 kg.   Intake/Output from previous day: 06/18 0701 - 06/19 0700 In: 840 [P.O.:840] Out: 800 [Urine:800] Intake/Output this shift: Total I/O In: 760.5 [P.O.:240; IV Piggyback:520.5] Out: -   Physical exam patient is alert and oriented.  He is moving his lower extremities well.  Lab Results: No results for input(s): "WBC", "HGB", "HCT", "PLT" in the last 72 hours. BMET No results for input(s): "NA", "K", "CL", "CO2", "GLUCOSE", "BUN", "CREATININE", "CALCIUM" in the last 72 hours.  Studies/Results: MR Lumbar Spine W Wo Contrast  Result Date: 01/14/2023 CLINICAL DATA:  Back pain extending into both lower extremities. Sepsis. Previous studies demonstrating findings suspicious for L4 and L5 osteonecrosis with possible superimposed infection. Clinical concern for infection. EXAM: MRI LUMBAR SPINE WITHOUT AND WITH CONTRAST TECHNIQUE: Multiplanar and multiecho pulse sequences of the lumbar spine were obtained without and with intravenous contrast. CONTRAST:  10mL GADAVIST GADOBUTROL 1 MMOL/ML IV SOLN COMPARISON:  Lumbar MRI 01/03/2023 and 12/25/2022. FINDINGS: Segmentation: Conventional anatomy assumed, with the last open disc space designated L5-S1.Concordant with prior imaging. Alignment:  Physiologic. Vertebrae: Progressive heterogeneous marrow signal abnormalities are identified throughout the L4 and L5 vertebral bodies. There is heterogeneous low T1 and  high T2 signal. Relatively limited enhancement of the marrow in these vertebral bodies is seen following contrast, again consistent with osteonecrosis. There are some serpiginous areas of peripheral enhancement within both vertebral bodies. As previously noted, there is T2 hyperintensity within the L4-5 disc without suspicious enhancement or endplate destruction. New mild endplate edema and enhancement in the inferior endplate of L1. Persistent changes of probable septic arthritis at the right L4-5 facet joint. The visualized sacroiliac joints appear unremarkable. Conus medullaris: Extends to the L1 level and appears normal. Progressive abnormality of the ventral epidural space now extending from T12 through the upper sacrum with peripheral enhancement, suspicious for an epidural abscess. Paraspinal and other soft tissues: There are progressive paraspinal inflammatory changes with peripherally enhancing fluid collections in both psoas muscles, measuring up to 2.5 cm on the left (image 34/9). As above, periarticular enhancement associated with the right facet joint at L4-5 consistent with septic arthritis. The urinary bladder is markedly distended. Disc levels: Sagittal images demonstrate no significant disc space findings within the visualized lower thoracic spine. L1-2: Stable loss of disc height with disc bulging and endplate osteophytes. Mild facet hypertrophy. Progressive superior extension of the ventral epidural fluid collection to this level. No significant spinal stenosis or nerve root encroachment. L2-3: Stable loss of disc height with annular disc bulging and endplate osteophytes. Progressive ventral epidural fluid collection at this level with mild mass effect on the thecal sac. Similar moderate spinal stenosis with lateral recess narrowing bilaterally. L3-4: Stable chronic loss of disc height with annular disc bulging and endplate osteophytes. The anterior epidural collection at this level has also  mildly enlarged. Resulting moderate spinal stenosis with asymmetric narrowing of the right lateral recess. L4-5: As  above, persistent discal T2 hyperintensity without endplate destruction or abnormal enhancement in the disc. Enlarging ventral epidural fluid collection with increasing mass effect on the thecal sac and increasing paraspinal inflammatory changes and ill-defined fluid collections as described above. Moderate to severe spinal stenosis with lateral recess and foraminal narrowing bilaterally. As above, persistent evidence of septic arthritis at the right facet joint. L5-S1: Stable loss of disc height with annular disc bulging and endplate osteophytes. Enlarging ventral epidural fluid collection extending posterior to the upper sacrum with increased mass effect on the sacral spinal canal. Mild foraminal narrowing bilaterally. IMPRESSION: 1. Compared with the previous MRI from 01/03/2023, there is progressive paraspinal inflammatory changes with peripherally enhancing fluid collections in both psoas muscles consistent with paraspinal abscesses. 2. There are progressive heterogeneous marrow signal changes within the L4 and L5 vertebral bodies consistent with osteonecrosis. As previously noted, there is L4-5 discal T2 hyperintensity without endplate destruction or abnormal enhancement as typically seen with discitis. However, the osseous changes and surrounding soft tissue findings are concerning for superimposed infection. 3. Enlarging ventral epidural fluid collection extending from T12 through the upper sacrum with peripheral enhancement, suspicious for an epidural abscess. There is increasing mass effect on the thecal sac, greatest at L4-5 where there is moderate to severe spinal stenosis. 4. Persistent evidence of septic arthritis at the right L4-5 facet joint. 5. New mild endplate edema and enhancement in the inferior endplate of L1 without evidence of adjacent disc abnormality, likely reactive. 6.  Distended urinary bladder. Electronically Signed   By: Carey Bullocks M.D.   On: 01/14/2023 15:40    Assessment/Plan: Lumbar discitis, osteomyelitis, epidural abscess, stenosis: I have again discussed the options with the patient.  I have answered all his questions regarding surgery.  He wants to proceed with surgery.  We will have to wait until Monday to allow his Eliquis anticoagulation to reverse.  LOS: 22 days     Cristi Loron 01/15/2023, 8:44 AM     Patient ID: Jim Ramirez, male   DOB: 10/27/1950, 72 y.o.   MRN: [id number]

## 2023-01-15 NOTE — Progress Notes (Signed)
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 Rt wrist edema with tenosynovitis, new Afib with RVR. MRI brain negative for acute infarct, but with pyogenic ventriculitis. Bil UE superficial thrombosis. PMhx: ACDF, HTN, T2DM   OT comments  Pt with slow steady progress toward established OT goals. Pt continues with loose stools and requiring total A for clean up as well as little warning contributing to reluctance to perform OOB transfers. Pt also with continued soft orthostatic positional BP. Pt requiring mod A to come to EOB and max a to return to supine. Observed with BLE edema L >R. Facilitating strengthening and blood flow with BLE exercises as well as strengthening of BUE. Requiring cues for technique throughout. Will continue to follow.    Recommendations for follow up therapy are one component of a multi-disciplinary discharge planning process, led by the attending physician.  Recommendations may be updated based on patient status, additional functional criteria and insurance authorization.    Assistance Recommended at Discharge Frequent or constant Supervision/Assistance  Patient can return home with the following  A lot of help with bathing/dressing/bathroom;A lot of help with walking and/or transfers;Two people to help with walking and/or transfers;Assistance with cooking/housework;Assist for transportation;Direct supervision/assist for medications management;Help with stairs or ramp for entrance;Assistance with feeding   Equipment Recommendations  Other (comment) (defer)    Recommendations for Other Services      Precautions / Restrictions Precautions Precautions: Back;Fall Precaution Booklet Issued: No Precaution Comments: discussed back precautions Required Braces or Orthoses: Spinal  Brace Spinal Brace: Lumbar corset;Applied in sitting position Restrictions Weight Bearing Restrictions: No       Mobility Bed Mobility Overal bed mobility: Needs Assistance Bed Mobility: Rolling, Sidelying to Sit, Sit to Sidelying Rolling: Min assist Sidelying to sit: Mod assist     Sit to sidelying: Max assist, +2 for physical assistance General bed mobility comments: cues for sequence, assist to clear legs and elevate trunk to sitting, and BLE assist for return to supine via log roll as well.    Transfers                   General transfer comment: declines to attempt     Balance Overall balance assessment: Needs assistance Sitting-balance support: Bilateral upper extremity supported Sitting balance-Leahy Scale: Fair                                     ADL either performed or assessed with clinical judgement   ADL Overall ADL's : Needs assistance/impaired     Grooming: Wash/dry face;Sitting;Min guard Grooming Details (indicate cue type and reason): min Guard A for safety due to poor sitting balance. Benefits from at leas one UE support                                    Extremity/Trunk Assessment Upper Extremity Assessment Upper Extremity Assessment: Generalized weakness   Lower Extremity Assessment Lower Extremity Assessment: Defer to PT evaluation (edema L>R)        Vision       Perception     Praxis      Cognition Arousal/Alertness: Awake/alert Behavior During Therapy: [facility] for tasks assessed/performed Overall Cognitive  Status: No family/caregiver present to determine baseline cognitive functioning Area of Impairment: Awareness, Problem solving, Safety/judgement                         Safety/Judgement: Decreased awareness of deficits Awareness: Emergent, Anticipatory Problem Solving: Slow processing, Decreased initiation General Comments: needing repeated cueing to correctly perform exercises and do  so appropriately. Some awareness of deficits as pt reporting "I don't think I am strong enough to stand up"        Exercises Exercises: Other exercises General Exercises - Lower Extremity Ankle Circles/Pumps: AROM, Seated, 10 reps Quad Sets: AROM, 10 reps, Seated Hip Flexion/Marching: AROM, Both, 10 reps, Seated Other Exercises Other Exercises: Forward punches 15x BUE    Shoulder Instructions       General Comments poor carryover/recall of precautions. BP of 124/73 (86) HR 62 at start of session and dropping to 86/68 (65) HR 115 at EOB    Pertinent Vitals/ Pain       Pain Assessment Pain Assessment: Faces Faces Pain Scale: Hurts a little bit Pain Location: back Pain Descriptors / Indicators: Sore, Discomfort Pain Intervention(s): Limited activity within patient's tolerance, Monitored during session, Patient requesting pain meds-RN notified  Home Living                                          Prior Functioning/Environment              Frequency  Min 2X/week        Progress Toward Goals  OT Goals(current goals can now be found in the care plan section)  Progress towards OT goals: Progressing toward goals  Acute Rehab OT Goals Patient Stated Goal: none stated this session OT Goal Formulation: With patient Time For Goal Achievement: 01/15/23 Potential to Achieve Goals: Good  Plan Discharge plan remains appropriate    Co-evaluation                 AM-PAC OT "6 Clicks" Daily Activity     Outcome Measure   Help from another person eating meals?: A Little Help from another person taking care of personal grooming?: A Little Help from another person toileting, which includes using toliet, bedpan, or urinal?: A Lot Help from another person bathing (including washing, rinsing, drying)?: A Lot Help from another person to put on and taking off regular upper body clothing?: A Lot Help from another person to put on and taking off regular  lower body clothing?: A Lot 6 Click Score: 14    End of Session    OT Visit Diagnosis: Other abnormalities of gait and mobility (R26.89);Muscle weakness (generalized) (M62.81);Pain   Activity Tolerance Patient limited by pain   Patient Left in bed;with call bell/phone within reach;with restraints reapplied   Nurse Communication Mobility status        Time: 1450-1520 OT Time Calculation (min): 30 min  Charges: OT General Charges $OT Visit: 1 Visit OT Treatments $Self Care/Home Management : 8-22 mins $Therapeutic Exercise: 8-22 mins  Tyler Deis, OTR/L [facility] Acute Rehabilitation Office: [phone]   Myrla Halsted 01/15/2023, 3:56 PM

## 2023-01-16 ENCOUNTER — Institutional Professional Consult (permissible substitution): Payer: Medicare HMO | Admitting: Internal Medicine

## 2023-01-16 DIAGNOSIS — M4646 Discitis, unspecified, lumbar region: Secondary | ICD-10-CM | POA: Diagnosis not present

## 2023-01-16 LAB — CBC
HCT: 27.4 % — ABNORMAL LOW (ref 39.0–52.0)
Hemoglobin: 8.2 g/dL — ABNORMAL LOW (ref 13.0–17.0)
MCH: 25.2 pg — ABNORMAL LOW (ref 26.0–34.0)
MCHC: 29.9 g/dL — ABNORMAL LOW (ref 30.0–36.0)
MCV: 84.3 fL (ref 80.0–100.0)
Platelets: 155 10*3/uL (ref 150–400)
RBC: 3.25 MIL/uL — ABNORMAL LOW (ref 4.22–5.81)
RDW: 17.5 % — ABNORMAL HIGH (ref 11.5–15.5)
WBC: 9.4 10*3/uL (ref 4.0–10.5)
nRBC: 0 % (ref 0.0–0.2)

## 2023-01-16 LAB — BASIC METABOLIC PANEL
Anion gap: 7 (ref 5–15)
BUN: 18 mg/dL (ref 8–23)
CO2: 27 mmol/L (ref 22–32)
Calcium: 8.4 mg/dL — ABNORMAL LOW (ref 8.9–10.3)
Chloride: 104 mmol/L (ref 98–111)
Creatinine, Ser: 1.29 mg/dL — ABNORMAL HIGH (ref 0.61–1.24)
GFR, Estimated: 60 mL/min — ABNORMAL LOW (ref >60)
Glucose, Bld: 143 mg/dL — ABNORMAL HIGH (ref 70–99)
Potassium: 3.5 mmol/L (ref 3.5–5.1)
Sodium: 138 mmol/L (ref 135–145)

## 2023-01-16 LAB — GLUCOSE, CAPILLARY
Glucose-Capillary: 199 mg/dL — ABNORMAL HIGH (ref 70–99)
Glucose-Capillary: 218 mg/dL — ABNORMAL HIGH (ref 70–99)
Glucose-Capillary: 255 mg/dL — ABNORMAL HIGH (ref 70–99)
Glucose-Capillary: 191 mg/dL — ABNORMAL HIGH (ref 70–99)

## 2023-01-16 MED ORDER — AMIODARONE HCL 200 MG PO TABS
200.0000 mg | ORAL_TABLET | Freq: Every day | ORAL | Status: DC
  Administered 2023-01-17 – 2023-01-28 (×12): 200 mg via ORAL
  Filled 2023-01-16 (×12): qty 1

## 2023-01-16 NOTE — Progress Notes (Signed)
Ok to remove dose duration for amiodarone per Dr. Jarvis Newcomer.  [name], PharmD, BCIDP, AAHIVP, CPP Infectious Disease Pharmacist 01/16/2023 10:19 AM

## 2023-01-16 NOTE — Progress Notes (Signed)
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 Rt wrist edema with tenosynovitis, new Afib with RVR. MRI brain negative for acute infarct, but with pyogenic ventriculitis. Bil UE superficial thrombosis. PMhx: ACDF, HTN, T2DM    PT Comments    Pt received in supine, agreeable to therapy session, with good participation and effort for bed mobility and transfer training. Pt stood from EOB with maxA +1 to RW but unable to march in stance at [facility] due to BLE weakness and noted LLE edema, so had pt stand again from EOB>Stedy with modA from elevated surface and totalA for transfer from bed>chair via Stedy. Pt agreeable to sit up in chair 1-2 hours but nursing staff notified not to let him sit too long to prevent excess pressure/pain with back infection, recommend Stedy vs mechanical lift for return to bed pending fatigue level. Pt continues to benefit from PT services to progress toward functional mobility goals.   Recommendations for follow up therapy are one component of a multi-disciplinary discharge planning process, led by the attending physician.  Recommendations may be updated based on patient status, additional functional criteria and insurance authorization.  Follow Up Recommendations  Can patient physically be transported by private vehicle: No    Assistance Recommended at Discharge Frequent or constant Supervision/Assistance  Patient can return home with the following Assistance with cooking/housework;Assistance with feeding;Help with stairs or ramp for entrance;Assist for transportation;Direct supervision/assist for financial management;A lot of help with bathing/dressing/bathroom;Two people to help with walking and/or transfers   Equipment Recommendations  Hospital bed;Wheelchair (measurements PT);Wheelchair  cushion (measurements PT);BSC/3in1    Recommendations for Other Services       Precautions / Restrictions Precautions Precautions: Back;Fall Precaution Booklet Issued: No Precaution Comments: discussed back precautions, pt needs cues with bed mobility Required Braces or Orthoses: Spinal Brace Spinal Brace: Lumbar corset;Applied in sitting position Restrictions Weight Bearing Restrictions: No     Mobility  Bed Mobility Overal bed mobility: Needs Assistance Bed Mobility: Rolling, Sidelying to Sit, Sit to Sidelying Rolling: Min assist, Mod assist Sidelying to sit: Max assist, +2 for safety/equipment       General bed mobility comments: cues for sequence, assist to clear legs and elevate trunk to sitting, pt needs maxA today to lift his trunk during log roll.    Transfers Overall transfer level: Needs assistance Equipment used: Rolling walker (2 wheels), Ambulation equipment used Transfers: Sit to/from Stand, Bed to chair/wheelchair/BSC Sit to Stand: Max assist, From elevated surface (to RW), ModA (to [place])           General transfer comment: STS from elevated bed>RW with +1 maxA, attempted lateral step toward his L but needing totalA to weight shift/offload pressure and to slide LLE for step toward [facility], then unable to continue due to fatigue. Pt took seated break EOB then agreeable to use Stedy for OOB to chair +1 modA for STS from EOB>Stedy and +2 for safety with stand>sit to lower chair surface with NT assisting for safety with lines. Transfer via Lift Equipment: Stedy  Ambulation/Gait             Pre-gait activities: attempted weight shifting in RW, max to totalA to slide LLE toward his L side, unable to march in stance      Balance Overall balance assessment: Needs assistance Sitting-balance support: Single extremity supported, Feet supported  Sitting balance-Leahy Scale: Fair Sitting balance - Comments: needs R bed rail for support or BUE support on mattress  to prevent LOB   Standing balance support: Bilateral upper extremity supported, Reliant on assistive device for balance Standing balance-Leahy Scale: Poor Standing balance comment: maxA in RW, modA in [place], can only stand up to 90 seconds at a time prior to fatigue.                            Cognition Arousal/Alertness: Awake/alert Behavior During Therapy: WFL for tasks assessed/performed Overall Cognitive Status: No family/caregiver present to determine baseline cognitive functioning Area of Impairment: Awareness, Problem solving, Safety/judgement                     Memory: Decreased recall of precautions   Safety/Judgement: Decreased awareness of deficits Awareness: Emergent, Anticipatory Problem Solving: Slow processing, Decreased initiation General Comments: decreased recall of back precs during functional tasks.        Exercises Other Exercises Other Exercises: encouraged IS hourly, could not find IS in room, RN notified    General Comments General comments (skin integrity, edema, etc.): SBP 106 while sitting EOB, only mild c/o dizziness, <20pt drop from prior SBP reading in supine. HR 113 bpm sitting and up to 125 bpm with exertion. SpO2 95% on RA, poor signal while standing due to hand grip on AD. Briefs donned for OOB tasks due to pt history of bowel incontinence.      Pertinent Vitals/Pain Pain Assessment Pain Assessment: Faces Faces Pain Scale: Hurts little more Pain Location: back Pain Descriptors / Indicators: Sore, Discomfort Pain Intervention(s): Monitored during session, Repositioned, Other (comment) (pt had IV pain meds ~9am, none recently per pt)    Home Living                          Prior Function            PT Goals (current goals can now be found in the care plan section) Acute Rehab PT Goals Patient Stated Goal: return to walking PT Goal Formulation: With patient Time For Goal Achievement: 01/14/23 Progress  towards PT goals: Progressing toward goals    Frequency    Min 1X/week      PT Plan Current plan remains appropriate       AM-PAC PT "6 Clicks" Mobility   Outcome Measure  Help needed turning from your back to your side while in a flat bed without using bedrails?: A Lot Help needed moving from lying on your back to sitting on the side of a flat bed without using bedrails?: Total (flat bed/no rail) Help needed moving to and from a bed to a chair (including a wheelchair)?: Total Help needed standing up from a chair using your arms (e.g., wheelchair or bedside chair)?: A Lot Help needed to walk in hospital room?: Total Help needed climbing 3-5 steps with a railing? : Total 6 Click Score: 8    End of Session Equipment Utilized During Treatment: Gait belt;Back brace Activity Tolerance: Patient tolerated treatment well Patient left: in chair;with call bell/phone within reach;with chair alarm set;with nursing/sitter in room Nurse Communication: Mobility status;Need for lift equipment;Precautions PT Visit Diagnosis: Other abnormalities of gait and mobility (R26.89);Muscle weakness (generalized) (M62.81)     Time: 1610-9604 PT Time Calculation (min) (ACUTE ONLY): 36 min  Charges:  $Therapeutic Activity: 23-37 mins  Florina Ou., PTA Acute Rehabilitation Services Secure Chat Preferred 9a-5:30pm Office: [phone]    Dorathy Kinsman [facility] 01/16/2023, 2:44 PM

## 2023-01-16 NOTE — Progress Notes (Signed)
[facility] PROGRESS NOTE  Jim Ramirez (DOB: Feb 21, 1951) BJY:[id number] PCP: [name], MD  Brief Narrative: Jim Ramirez is a 72 y.o. male with a history of hypertension and diabetes initially presented to [facility] on 12/22/2022 for chronic back pain radiating both her legs, with some shortness of breath that started 1 week prior to admission CT scan of the abdomen pelvis or lumbar spine discitis and osteomyelitis, transferred to [facility] found to be bacteremic with staph aureus with necrosis of the lumbar spine, also in A-fib with RVR, neurology, ID and cardiology were consulted. MRI of the wrist was done that showed fluid collection and surgery was consulted who recommended conservative management. Despite IV antibiotics he continues to spike fevers.  MRI lumbar spine done on 6/7 showed worsening of infection/concerns of epidural abscess but neurosurgery recommending against any surgical intervention. Initial blood cultures at [facility] grew MSSA. TEE from 6/4 does not show any vegetation but MRI does show small fluid collection, CT surgery recommended conservative management.  Infectious disease recommended IV nafcillin from negative cultures on 5/28 until 01/10/2023, now switched to cefazolin 2 g IV every 8 hourly for additional 6 weeks, thereafter cefadroxil 1000 mg twice daily (starting 7/24) for at least 10 months.   Due to progressive low back pain radiating down legs, MRI repeated 6/7 showing persistent L4, L5 osteonecrosis, probable L4/5 discitis with right L4-5 septic facet, multiple psoas intramuscular loculated fluid collections. Despite continued IV antibiotics, a repeat MRI 6/18 showing new epidural abscess and stenosis. Neurosurgery discussed risks and benefits of surgery at length with the patient who opts to proceed with surgery after eliquis washout on 6/24.   Subjective: Pain controlled, swelling is stable, at his usual swollen baseline in ankles/feet. No dyspnea  or other complaints. Eating lunch.  Objective: BP 118/64 (BP Location: Right Arm)   Pulse 88   Temp 98.3 F (36.8 C) (Oral)   Resp 15   Ht 6' (1.829 m)   Wt 91.3 kg Comment: bed weight  SpO2 96%   BMI 27.30 kg/m   Gen: Older male in no distress Pulm: Clear, nonlabored  CV: RRR, NSR on monitor, no MRG. Stable 1+ pitting edema at ankles.  GI: Soft, NT, ND, +BS  Neuro: Alert and oriented. No new focal deficits. Ext: Warm, no deformities Skin: No new rashes, lesions or ulcers on visualized skin   Assessment & Plan: Severe sepsis secondary to MSSA bacteremia, persistent vertebral osteonecrosis/infarct of L4-L-L5 with discitis/septic arthritis and multiple loculated fluid collection around psoas muscle: He has continued hyperintensity of his L4 and L5 vertebral bodies. There is septic arthritis in his right L4-5 facet. 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 There is spinal stenosis most prominent at L4-5.  - ID has placed antibiotic orders, continuing ancef currently, transition to cefadroxil 7/24.   - Neurosurgery, Dr. Lovell Sheehan, planning surgery on 6/24 after eliquis washout.   Left wrist edema/concern for tenosynovitis: - MRI shows persistent and worsening abscess of the right wrist, [facility] was consulted, status post bedside aspiration 6/10 but not enough sample.  Grossly did not appear purulent per their documentation. Planning to continue antibiotics.   Acute kidney injury: Resolved. - Recheck   Osteonecrosis of L4-L5: - Analgesia.    New onset paroxysmal atrial fibrillation: - Continue amiodarone, now down to 200mg  daily, remains in NSR. - Holding eliquis in preparation for neurosurgery, so restarted lovenox which will be held the day prior to surgery.  Iron deficiency anemia:  - Continue iron supplements with bowel regimen   Hypokalemia: Improved.   - Monitor intermittently   Thrombocytopenia: Likely due to sepsis, resolved      Elevated LFTs and hyperbilirubinemia: - Recheck 6/22.    Elevate troponin: -Likely demand ischemia.   -No chest pain.     BPH: -Continue Flomax.   Diabetes mellitus type 2 with hyperglycemia: -A1c of 8.1% - Continue glargine 15u qHS and SSI, at inpatient goal.   Obstructive sleep apnea: -Continue CPAP at night.   Hyperlipidemia:  - Continue statin   Mood disorder: - Continue lexapro daily, risperdal qHS   Moderate protein caloric malnutrition: - Supplement protein.   Goals of care: - Patient has already been seen by palliative care.  Currently DNR but continue full treatment. Will continue ongoing discussions by palliative regarding goals of care. Overall very poor prognosis, though the only chance at an acceptable quality of life to the patient would require neurosurgery which is planned.   [name], MD [facility] [email or web address] 01/16/2023, 11:46 AM

## 2023-01-17 DIAGNOSIS — G8929 Other chronic pain: Secondary | ICD-10-CM | POA: Diagnosis not present

## 2023-01-17 DIAGNOSIS — M545 Low back pain, unspecified: Secondary | ICD-10-CM | POA: Diagnosis not present

## 2023-01-17 DIAGNOSIS — E440 Moderate protein-calorie malnutrition: Secondary | ICD-10-CM | POA: Diagnosis not present

## 2023-01-17 DIAGNOSIS — R7881 Bacteremia: Secondary | ICD-10-CM | POA: Diagnosis not present

## 2023-01-17 DIAGNOSIS — Z515 Encounter for palliative care: Secondary | ICD-10-CM | POA: Diagnosis not present

## 2023-01-17 DIAGNOSIS — M4640 Discitis, unspecified, site unspecified: Secondary | ICD-10-CM | POA: Diagnosis not present

## 2023-01-17 DIAGNOSIS — M4646 Discitis, unspecified, lumbar region: Secondary | ICD-10-CM | POA: Diagnosis not present

## 2023-01-17 LAB — GLUCOSE, CAPILLARY
Glucose-Capillary: 148 mg/dL — ABNORMAL HIGH (ref 70–99)
Glucose-Capillary: 218 mg/dL — ABNORMAL HIGH (ref 70–99)
Glucose-Capillary: 282 mg/dL — ABNORMAL HIGH (ref 70–99)
Glucose-Capillary: 177 mg/dL — ABNORMAL HIGH (ref 70–99)

## 2023-01-17 MED ORDER — SENNA 8.6 MG PO TABS
2.0000 | ORAL_TABLET | Freq: Every day | ORAL | Status: DC
  Administered 2023-01-17 – 2023-01-28 (×8): 17.2 mg via ORAL
  Filled 2023-01-17 (×9): qty 2

## 2023-01-17 MED ORDER — ENOXAPARIN SODIUM 100 MG/ML IJ SOSY
90.0000 mg | PREFILLED_SYRINGE | Freq: Two times a day (BID) | INTRAMUSCULAR | Status: DC
  Administered 2023-01-17 – 2023-01-18 (×2): 90 mg via SUBCUTANEOUS
  Filled 2023-01-17 (×3): qty 0.9

## 2023-01-17 NOTE — Plan of Care (Signed)
  Problem: Clinical Measurements: Goal: Ability to maintain clinical measurements within normal limits will improve Outcome: Progressing Goal: Will remain free from infection Outcome: Progressing Goal: Cardiovascular complication will be avoided Outcome: Progressing   Problem: Nutrition: Goal: Adequate nutrition will be maintained Outcome: Progressing   Problem: Pain Managment: Goal: General experience of comfort will improve Outcome: Progressing   Problem: Safety: Goal: Ability to remain free from injury will improve Outcome: Progressing   Problem: Skin Integrity: Goal: Risk for impaired skin integrity will decrease Outcome: Progressing   

## 2023-01-17 NOTE — Progress Notes (Signed)
   Providing Compassionate, Quality Care - Together   Subjective: Patient reports no new issues.  Objective: Vital signs in last 24 hours: Temp:  [98.1 F (36.7 C)-98.8 F (37.1 C)] 98.1 F (36.7 C) (06/21 1054) Pulse Rate:  [87-100] 91 (06/21 1054) Resp:  [14-20] 14 (06/21 1054) BP: (120-146)/(62-77) 120/67 (06/21 1054) SpO2:  [94 %-97 %] 94 % (06/21 1054)  Intake/Output from previous day: 06/20 0701 - 06/21 0700 In: 640 [P.O.:240; IV Piggyback:400] Out: 2000 [Urine:2000] Intake/Output this shift: Total I/O In: 240 [P.O.:240] Out: 300 [Urine:300]  Alert and oriented x 4 PERRLA MAE, Mild generalized weakness likely secondary to deconditioning  Lab Results: Recent Labs    01/16/23 0514  WBC 9.4  HGB 8.2*  HCT 27.4*  PLT 155   BMET Recent Labs    01/16/23 0514  NA 138  K 3.5  CL 104  CO2 27  GLUCOSE 143*  BUN 18  CREATININE 1.29*  CALCIUM 8.4*    Studies/Results: No results found.  Assessment/Plan: Patient admitted with significant back pain with radiation down BLE. He was septic with elevated troponin and a-fib with RVR. 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    LOS: 24 days   -Plan is for surgery on 01/20/2023 with Dr. Lovell Sheehan to evacuate the patient's epidural abscess.   Val Eagle, DNP, AGNP-C Nurse Practitioner  [facility] [address], [place], [place] [address] P: [phone]    F: [phone]  01/17/2023, 8:50 AM

## 2023-01-17 NOTE — Progress Notes (Signed)
[facility] PROGRESS NOTE  Jim PROVENCAL (DOB: 1951/02/25) ZOX:[id number] PCP: Jim Peri, MD  Brief Narrative: Jim Ramirez is a 72 y.o. male with a history of hypertension and diabetes initially presented to [facility] on 12/22/2022 for chronic back pain radiating both her legs, with some shortness of breath that started 1 week prior to admission CT scan of the abdomen pelvis or lumbar spine discitis and osteomyelitis, transferred to [facility] found to be bacteremic with staph aureus with necrosis of the lumbar spine, also in A-fib with RVR, neurology, ID and cardiology were consulted. MRI of the wrist was done that showed fluid collection and surgery was consulted who recommended conservative management. Despite IV antibiotics he continues to spike fevers.  MRI lumbar spine done on 6/7 showed worsening of infection/concerns of epidural abscess but neurosurgery recommending against any surgical intervention. Initial blood cultures at [facility] grew MSSA. TEE from 6/4 does not show any vegetation but MRI does show small fluid collection, CT surgery recommended conservative management.  Infectious disease recommended IV nafcillin from negative cultures on 5/28 until 01/10/2023, now switched to cefazolin 2 g IV every 8 hourly for additional 6 weeks, thereafter cefadroxil 1000 mg twice daily (starting 7/24) for at least 10 months.   Due to progressive low back pain radiating down legs, MRI repeated 6/7 showing persistent L4, L5 osteonecrosis, probable L4/5 discitis with right L4-5 septic facet, multiple psoas intramuscular loculated fluid collections. Despite continued IV antibiotics, a repeat MRI 6/18 showing new epidural abscess and stenosis. Neurosurgery discussed risks and benefits of surgery at length with the patient who opts to proceed with surgery after eliquis washout on 6/24.   Subjective: No complaints at all, feels his swelling is stable at his baseline, maybe improved. On ROS  confirms last BM was 6/18 but is eating well, has no abd pain. We discussed prayer. Denies bleeding.  Objective: BP 120/67 (BP Location: Right Arm)   Pulse 91   Temp 98.1 F (36.7 C) (Oral)   Resp 14   Ht 6' (1.829 m)   Wt 91.3 kg Comment: bed weight  SpO2 94%   BMI 27.30 kg/m   Gen: Older male in no distress Pulm: Clear, nonlabored  CV: RRR, no MRG, stable LE edema. GI: Soft, NT, ND, +BS  Neuro: Alert and oriented. No new focal deficits. Ext: Warm, no deformities Skin: No new rashes, lesions or ulcers on visualized skin   Assessment & Plan: Severe sepsis secondary to MSSA bacteremia, persistent vertebral osteonecrosis/infarct of L4-L-L5 with discitis/septic arthritis and multiple loculated fluid collection around psoas muscle: He has continued hyperintensity of his L4 and L5 vertebral bodies. There is septic arthritis in his right L4-5 facet. 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 There is spinal stenosis most prominent at L4-5.  - ID has placed antibiotic orders, continuing ancef currently, transition to cefadroxil 7/24.   - Neurosurgery, Dr. Lovell Ramirez, planning surgery on 6/24 after eliquis washout. Discussion of risks/potential benefits of surgery have been discussed between surgeon and patient/HCPOA per neurosurgery NP.   Left wrist edema/concern for tenosynovitis: - MRI shows persistent and worsening abscess of the right wrist, [facility] was consulted, status post bedside aspiration 6/10 but not enough sample.  Grossly did not appear purulent per their documentation. Planning to continue antibiotics.   Acute kidney injury: Resolved. - Recheck in AM   Osteonecrosis of L4-L5: - Analgesia.    New onset paroxysmal atrial fibrillation: - Continue amiodarone, now down to 200mg   daily, remains in NSR. - Holding eliquis in preparation for neurosurgery, so restarted lovenox which will be held the day prior to surgery.     Iron deficiency anemia:  -  Continue iron supplements with bowel regimen   Hypokalemia: Improved.   - Monitor intermittently   Thrombocytopenia: Likely due to sepsis, resolved     Elevated LFTs and hyperbilirubinemia: - Recheck 6/22.    Elevate troponin: -Likely demand ischemia.   -No chest pain.    Constipation: Abd is benign, though opioids are being given.  - Schedule senokot instead of prn only.   BPH: -Continue Flomax.   Diabetes mellitus type 2 with hyperglycemia: -A1c of 8.1% - Continue glargine 15u qHS and SSI, at inpatient goal.   Obstructive sleep apnea: -Continue CPAP at night.   Hyperlipidemia:  - Continue statin   Mood disorder: - Continue lexapro daily, risperdal qHS   Moderate protein caloric malnutrition: - Supplement protein.   Goals of care: - Patient has already been seen by palliative care.  Currently DNR but continue full treatment. Will continue ongoing discussions by palliative regarding goals of care. Overall very poor prognosis, though the only chance at an acceptable quality of life to the patient would require neurosurgery which is planned.   Jim Nine, MD [facility] [email or web address] 01/17/2023, 1:23 PM

## 2023-01-18 ENCOUNTER — Inpatient Hospital Stay (HOSPITAL_COMMUNITY): Payer: Medicare HMO

## 2023-01-18 DIAGNOSIS — M7989 Other specified soft tissue disorders: Secondary | ICD-10-CM

## 2023-01-18 DIAGNOSIS — M4646 Discitis, unspecified, lumbar region: Secondary | ICD-10-CM | POA: Diagnosis not present

## 2023-01-18 LAB — COMPREHENSIVE METABOLIC PANEL
ALT: 6 U/L (ref 0–44)
AST: 13 U/L — ABNORMAL LOW (ref 15–41)
Albumin: <1.5 g/dL — ABNORMAL LOW (ref 3.5–5.0)
Alkaline Phosphatase: 61 U/L (ref 38–126)
Anion gap: 11 (ref 5–15)
BUN: 16 mg/dL (ref 8–23)
CO2: 26 mmol/L (ref 22–32)
Calcium: 8.7 mg/dL — ABNORMAL LOW (ref 8.9–10.3)
Chloride: 101 mmol/L (ref 98–111)
Creatinine, Ser: 1.15 mg/dL (ref 0.61–1.24)
GFR, Estimated: >60 mL/min (ref >60)
Glucose, Bld: 138 mg/dL — ABNORMAL HIGH (ref 70–99)
Potassium: 3.5 mmol/L (ref 3.5–5.1)
Sodium: 138 mmol/L (ref 135–145)
Total Bilirubin: 0.4 mg/dL (ref 0.3–1.2)
Total Protein: 6.2 g/dL — ABNORMAL LOW (ref 6.5–8.1)

## 2023-01-18 LAB — GLUCOSE, CAPILLARY
Glucose-Capillary: 148 mg/dL — ABNORMAL HIGH (ref 70–99)
Glucose-Capillary: 232 mg/dL — ABNORMAL HIGH (ref 70–99)
Glucose-Capillary: 215 mg/dL — ABNORMAL HIGH (ref 70–99)
Glucose-Capillary: 155 mg/dL — ABNORMAL HIGH (ref 70–99)

## 2023-01-18 LAB — CBC
HCT: 26.1 % — ABNORMAL LOW (ref 39.0–52.0)
Hemoglobin: 7.7 g/dL — ABNORMAL LOW (ref 13.0–17.0)
MCH: 25.3 pg — ABNORMAL LOW (ref 26.0–34.0)
MCHC: 29.5 g/dL — ABNORMAL LOW (ref 30.0–36.0)
MCV: 85.9 fL (ref 80.0–100.0)
Platelets: 205 10*3/uL (ref 150–400)
RBC: 3.04 MIL/uL — ABNORMAL LOW (ref 4.22–5.81)
RDW: 17.9 % — ABNORMAL HIGH (ref 11.5–15.5)
WBC: 9.7 10*3/uL (ref 4.0–10.5)
nRBC: 0 % (ref 0.0–0.2)

## 2023-01-18 MED ORDER — POTASSIUM CHLORIDE CRYS ER 20 MEQ PO TBCR
40.0000 meq | EXTENDED_RELEASE_TABLET | Freq: Once | ORAL | Status: AC
  Administered 2023-01-18: 40 meq via ORAL
  Filled 2023-01-18: qty 2

## 2023-01-18 MED ORDER — DICLOFENAC SODIUM 1 % EX GEL
2.0000 g | Freq: Four times a day (QID) | CUTANEOUS | Status: DC
  Administered 2023-01-18 – 2023-01-28 (×36): 2 g via TOPICAL
  Filled 2023-01-18: qty 100

## 2023-01-18 MED ORDER — HEPARIN (PORCINE) 25000 UT/250ML-% IV SOLN
1900.0000 [IU]/h | INTRAVENOUS | Status: DC
  Administered 2023-01-18 – 2023-01-19 (×2): 1250 [IU]/h via INTRAVENOUS
  Administered 2023-01-20: 1900 [IU]/h via INTRAVENOUS
  Filled 2023-01-18 (×3): qty 250

## 2023-01-18 MED ORDER — ALTEPLASE 2 MG IJ SOLR
2.0000 mg | Freq: Once | INTRAMUSCULAR | Status: AC
  Administered 2023-01-18: 2 mg
  Filled 2023-01-18: qty 2

## 2023-01-18 NOTE — Plan of Care (Signed)
  Problem: Education: Goal: Knowledge of General Education information will improve Description: Including pain rating scale, medication(s)/side effects and non-pharmacologic comfort measures Outcome: Progressing   Problem: Clinical Measurements: Goal: Ability to maintain clinical measurements within normal limits will improve Outcome: Progressing Goal: Will remain free from infection Outcome: Progressing Goal: Respiratory complications will improve Outcome: Progressing Goal: Cardiovascular complication will be avoided Outcome: Progressing   Problem: Education: Goal: Knowledge of General Education information will improve Description: Including pain rating scale, medication(s)/side effects and non-pharmacologic comfort measures Outcome: Progressing   Problem: Clinical Measurements: Goal: Ability to maintain clinical measurements within normal limits will improve Outcome: Progressing Goal: Will remain free from infection Outcome: Progressing Goal: Respiratory complications will improve Outcome: Progressing Goal: Cardiovascular complication will be avoided Outcome: Progressing

## 2023-01-18 NOTE — Progress Notes (Signed)
BLE venous duplex has been completed.  Preliminary results given to Dr. Jerolyn Center.   Results can be found under chart review under CV PROC. 01/18/2023 3:49 PM Leigh Blas RVT, RDMS

## 2023-01-18 NOTE — Progress Notes (Signed)
Subjective: Patient reports no new issues. L>R LE swelling noted on exam. Pt reports mild tenderness to L calf upon palpation. Denies CP, SOB.    Objective: BP 138/70 (BP Location: Right Arm)   Pulse 80   Temp 98.7 F (37.1 C) (Oral)   Resp 16   Ht 6' (1.829 m)   Wt 91.3 kg Comment: bed weight  SpO2 94%   BMI 27.30 kg/m   Intake/Output from previous day: I/O last 3 completed shifts: In: 1440 [P.O.:840; IV Piggyback:600] Out: 2550 [Urine:2550]   Alert and oriented x 4 MAE, Mild generalized weakness likely secondary to deconditioning L>R LE swelling/pitting edema noted on exam.   Lab Results: Recent Labs (last 2 labs)     Recent Labs    01/16/23 0514  WBC 9.4  HGB 8.2*  HCT 27.4*  PLT 155      BMET Recent Labs (last 2 labs)     Recent Labs    01/16/23 0514  NA 138  K 3.5  CL 104  CO2 27  GLUCOSE 143*  BUN 18  CREATININE 1.29*  CALCIUM 8.4*        Studies/Results: Imaging Results (Last 48 hours)  No results found.     Assessment/Plan: Patient admitted with significant back pain with radiation down BLE. He was septic with elevated troponin and a-fib with RVR. 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    LOS: 25 days    -Plan is for surgery on 01/20/2023 with Dr. Lovell Sheehan to evacuate the patient's epidural abscess. -LE Doppler ordered.   Call w/ questions/concerns.

## 2023-01-18 NOTE — Progress Notes (Signed)
ANTICOAGULATION CONSULT NOTE Pharmacy Consult for  Enoxaparin>>apixaban>>lovenox Indication: atrial fibrillation  No Known Allergies  Patient Measurements: Height: 6' (182.9 cm) Weight: 91.3 kg (201 lb 4.5 oz) (bed weight) IBW/kg (Calculated) : 77.6 Heparin Dosing Weight: 93kg  Vital Signs: Temp: 98.6 F (37 C) (06/22 0400) Temp Source: Oral (06/22 0400) BP: 128/66 (06/22 0400) Pulse Rate: 80 (06/22 0400)  Labs: Recent Labs    01/16/23 0514 01/18/23 0525  HGB 8.2* 7.7*  HCT 27.4* 26.1*  PLT 155 205  CREATININE 1.29* 1.15     Estimated Creatinine Clearance: 65.6 mL/min (by C-G formula based on SCr of 1.15 mg/dL).  Assessment: 72 y.o. male with new-onset AFib with RVR in the setting of MSSA bacteremia and lumbar spine osteomyelitis and pyogenic ventriculitis. Patient not taking any anticoagulation prior to admission. Pharmacy initially consulted to dose heparin infusion for AFib.   On 01/03/23 Heparin was changed to Enoxaparin treatment dosing because of concern for heparin resistance.     Continues on Enoxaparin 90 mg SQ q12h for new onset Afib.  Dose is ~ 1 mg/kg q12h. Weight 86.5 kg   Hgb down from 9s to 7.7 this AM. Plt count is WNL. Iron defic anemia, on po iron supplement daily w/ bowel regimen as needed.  12/30/22 Vasc US BUE duplex:  bilateral UE acute superficial thrombus;No evidence of DVT.  CrCl 65 ml/min, current enoxaparin dose is appropriate. Neurosurgery plan to do washout on Monday. Plan to bridge with Lovenox until Sunday morning. Dr. Jarvis Newcomer agreed.  Goal of Therapy:  antiXa level: 0.6-1 unit/ml  peak level (4 hours after a dose) Monitor PLTC    Plan:  Continue Lovenox 90mg  BID until Sunday morning Copay cost of apixaban or rivaroxaban is $11.20/month F/u resuming DOAC post surgery on Monday  Thank you for involving pharmacy in this patient's care.  Enos Fling, PharmD PGY2 Pharmacy Resident 01/18/2023 7:21 AM

## 2023-01-18 NOTE — Progress Notes (Signed)
Mobility Specialist: Progress Note   01/18/23 1406  Mobility  Activity Off unit   Pt off unit for vascular ultrasound. Will f/u as able.   Verneice Caspers Mobility Specialist Please contact via SecureChat or Rehab office at [phone]

## 2023-01-18 NOTE — Progress Notes (Addendum)
PROGRESS NOTE    Jim Ramirez  OZH:[id number] DOB: 02/13/1951 DOA: 12/24/2022 PCP: [name], MD   Brief Narrative: 72 y.o. male with a history of hypertension and diabetes initially presented to [facility] on 12/22/2022 for chronic back pain radiating both her legs, with some shortness of breath that started 1 week prior to admission CT scan of the abdomen pelvis or lumbar spine discitis and osteomyelitis, transferred to [facility] found to be bacteremic with staph aureus with necrosis of the lumbar spine, also in A-fib with RVR, neurology, ID and cardiology were consulted. MRI of the wrist was done that showed fluid collection and surgery was consulted who recommended conservative management. Despite IV antibiotics he continues to spike fevers.  MRI lumbar spine done on 6/7 showed worsening of infection/concerns of epidural abscess but neurosurgery recommending against any surgical intervention. Initial blood cultures at [facility] grew MSSA. TEE from 6/4 does not show any vegetation but MRI does show small fluid collection, CT surgery recommended conservative management.  Infectious disease recommended IV nafcillin from negative cultures on 5/28 until 01/10/2023, now switched to cefazolin 2 g IV every 8 hourly for additional 6 weeks, thereafter cefadroxil 1000 mg twice daily (starting 7/24) for at least 10 months.    Due to progressive low back pain radiating down legs, MRI repeated 6/7 showing persistent L4, L5 osteonecrosis, probable L4/5 discitis with right L4-5 septic facet, multiple psoas intramuscular loculated fluid collections. Despite continued IV antibiotics, a repeat MRI 6/18 showing new epidural abscess and stenosis. Neurosurgery discussed risks and benefits of surgery at length with the patient who opts to proceed with surgery after eliquis washout on 6/24.   Assessment & Plan:   Principal Problem:   Discitis of lumbar region Active Problems:   Diabetes mellitus type 2 in  nonobese (HCC)   Dyslipidemia   Essential hypertension   New onset atrial fibrillation (HCC)   Sepsis (HCC)   BPH (benign prostatic hyperplasia)   Mood disorder (HCC)   MSSA bacteremia   Malnutrition of moderate degree   Severe sepsis secondary to MSSA bacteremia, persistent vertebral osteonecrosis/infarct of L4-L-L5 with discitis/septic arthritis and multiple loculated fluid collection around psoas muscle: He has continued hyperintensity of his L4 and L5 vertebral bodies. There is septic arthritis in his right L4-5 facet. 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 There is spinal stenosis most prominent at L4-5.  - ID has placed antibiotic orders, continuing ancef currently, transition to cefadroxil 7/24.   - Neurosurgery, Dr. Lovell Sheehan, planning surgery on 6/24 after eliquis washout. Discussion of risks/potential benefits of surgery have been discussed between surgeon and patient/HCPOA per neurosurgery NP.   Left wrist edema/concern for tenosynovitis: - MRI shows persistent and worsening abscess of the right wrist, [facility] was consulted, status post bedside aspiration 6/10 but not enough sample.  Grossly did not appear purulent per their documentation. Planning to continue antibiotics.   Acute kidney injury: Resolved.   Osteonecrosis of L4-L5: - Analgesia.    New onset paroxysmal atrial fibrillation: - Continue amiodarone, now down to 200mg  daily, remains in NSR. - Holding eliquis in preparation for neurosurgery, now on lovenox   Iron deficiency anemia:  - Continue iron supplements with bowel regimen   Hypokalemia: Improved.   - Monitor intermittently   Thrombocytopenia: Likely due to sepsis, resolved     Elevated LFTs and hyperbilirubinemia: - Recheck 6/23   Elevate troponin: -Likely demand ischemia.   -No chest pain.     Constipation:  Abd is benign, though opioids are being given.  - Schedule senokot instead of prn only.   BPH: -Continue  Flomax.   Diabetes mellitus type 2 with hyperglycemia: -A1c of 8.1% - Continue glargine 15u qHS and SSI, at inpatient goal.   Obstructive sleep apnea: -Continue CPAP at night.   Hyperlipidemia:  - Continue statin   Mood disorder: - Continue lexapro daily, risperdal qHS   Moderate protein caloric malnutrition: - Supplement protein.  LLE ACUTE DVT 6/22 ON lovenox (was on eliquis on hold for surgery 6/24) Consulted IR For ivc filter   Goals of care: - Patient has already been seen by palliative care.  Currently DNR but continue full treatment. Will continue ongoing discussions by palliative regarding goals of care. Overall very poor prognosis, though the only chance at an acceptable quality of life to the patient would require neurosurgery which is planned.    Pressure Injury 01/03/23 Buttocks Right;Left Stage 2 -  Partial thickness loss of dermis presenting as a shallow open injury with a red, pink wound bed without slough. 1x1 (Active)  01/03/23 2100  Location: Buttocks  Location Orientation: Right;Left  Staging: Stage 2 -  Partial thickness loss of dermis presenting as a shallow open injury with a red, pink wound bed without slough.  Wound Description (Comments): 1x1  Present on Admission: No  Dressing Type Foam - Lift dressing to assess site every shift 01/18/23 0805     Pressure Injury 01/18/23 Heel Left Stage 1 -  Intact skin with non-blanchable redness of a localized area usually over a bony prominence. (Active)  01/18/23 1337  Location: Heel  Location Orientation: Left  Staging: Stage 1 -  Intact skin with non-blanchable redness of a localized area usually over a bony prominence.  Wound Description (Comments):   Present on Admission:   Dressing Type Foam - Lift dressing to assess site every shift 01/18/23 0805      Nutrition Problem: Moderate Malnutrition Etiology: acute illness     Signs/Symptoms: energy intake < 75% for > 7 days, mild muscle depletion, mild  fat depletion    Interventions: Refer to RD note for recommendations  Estimated body mass index is 27.3 kg/m as calculated from the following:   Height as of this encounter: 6' (1.829 m).   Weight as of this encounter: 91.3 kg.  DVT prophylaxis:lovenox Code Status:DNR Family Communication:NONE Disposition Plan:  Status is: Inpatient Remains inpatient appropriate because: SPINAL ABSCESS   Consultants:  ID NEURO SURGERY Antimicrobials: Anti-infectives (From admission, onward)    Start     Dose/Rate Route Frequency Ordered Stop   02/19/23 1000  cefadroxil (DURICEF) capsule 1,000 mg        1,000 mg Oral 2 times daily 01/13/23 1117 11/26/23 0959   01/11/23 0600  ceFAZolin (ANCEF) IVPB 2g/100 mL premix        2 g 200 mL/hr over 30 Minutes Intravenous Every 8 hours 12/31/22 1449 02/18/23 2359   12/28/22 1015  nafcillin 12 g in sodium chloride 0.9 % 500 mL continuous infusion        12 g 20.8 mL/hr over 24 Hours Intravenous Every 24 hours 12/28/22 0925 01/11/23 1245   12/28/22 0830  nafcillin 2 g in sodium chloride 0.9 % 100 mL IVPB  Status:  Discontinued        2 g 216 mL/hr over 30 Minutes Intravenous Every 4 hours 12/28/22 0736 12/28/22 0925   12/26/22 1400  ceFAZolin (ANCEF) IVPB 2g/100 mL premix  Status:  Discontinued  2 g 200 mL/hr over 30 Minutes Intravenous Every 8 hours 12/26/22 0823 12/28/22 0736   12/25/22 2200  vancomycin (VANCOREADY) IVPB 1750 mg/350 mL  Status:  Discontinued        1,750 mg 175 mL/hr over 120 Minutes Intravenous Every 24 hours 12/25/22 0919 12/26/22 0823   12/25/22 1200  cefTRIAXone (ROCEPHIN) 2 g in sodium chloride 0.9 % 100 mL IVPB  Status:  Discontinued        2 g 200 mL/hr over 30 Minutes Intravenous Every 24 hours 12/25/22 0915 12/25/22 0935   12/24/22 2000  ceFEPIme (MAXIPIME) 2 g in sodium chloride 0.9 % 100 mL IVPB  Status:  Discontinued        2 g 200 mL/hr over 30 Minutes Intravenous Every 8 hours 12/24/22 1856 12/25/22 0837    12/24/22 2000  vancomycin (VANCOREADY) IVPB 1750 mg/350 mL  Status:  Discontinued        1,750 mg 175 mL/hr over 120 Minutes Intravenous Every 24 hours 12/24/22 1857 12/25/22 0837   12/24/22 1930  metroNIDAZOLE (FLAGYL) IVPB 500 mg  Status:  Discontinued        500 mg 100 mL/hr over 60 Minutes Intravenous Every 12 hours 12/24/22 1830 12/25/22 0837        Subjective: Alb less than 1.5 Resting in bed  C/o pain back and legs   Objective: Vitals:   01/17/23 2314 01/18/23 0400 01/18/23 0723 01/18/23 1219  BP: (!) 121/55 128/66 138/70 124/63  Pulse: 96 80  89  Resp: 17 15 16 18   Temp: 98.6 F (37 C) 98.6 F (37 C) 98.7 F (37.1 C) 98.1 F (36.7 C)  TempSrc: Oral Oral Oral Oral  SpO2: 91% 96% 94% 95%  Weight:      Height:        Intake/Output Summary (Last 24 hours) at 01/18/2023 1412 Last data filed at 01/18/2023 0500 Gross per 24 hour  Intake 320 ml  Output 1150 ml  Net -830 ml   Filed Weights   01/01/23 1500 01/05/23 0346 01/14/23 1241  Weight: 93 kg 86.5 kg 91.3 kg    Examination:  General exam: Appears in nad  Respiratory system: Clear to auscultation. Respiratory effort normal. Cardiovascular system: S1 & S2 heard, RRR. No JVD, murmurs, rubs, gallops or clicks. No pedal edema. Gastrointestinal system: Abdomen is nondistended, soft and nontender. No organomegaly or masses felt. Normal bowel sounds heard. Central nervous system: Alert and oriented.  Extremities: LLE edema    Data Reviewed: I have personally reviewed following labs and imaging studies  CBC: Recent Labs  Lab 01/12/23 0405 01/16/23 0514 01/18/23 0525  WBC 9.0 9.4 9.7  HGB 8.5* 8.2* 7.7*  HCT 28.4* 27.4* 26.1*  MCV 83.8 84.3 85.9  PLT 278 155 205   Basic Metabolic Panel: Recent Labs  Lab 01/12/23 0405 01/16/23 0514 01/18/23 0525  NA 138 138 138  K 3.5 3.5 3.5  CL 106 104 101  CO2 25 27 26   GLUCOSE 136* 143* 138*  BUN 21 18 16   CREATININE 0.99 1.29* 1.15  CALCIUM 8.3* 8.4* 8.7*   MG 2.0  --   --    GFR: Estimated Creatinine Clearance: 65.6 mL/min (by C-G formula based on SCr of 1.15 mg/dL). Liver Function Tests: Recent Labs  Lab 01/18/23 0525  AST 13*  ALT 6  ALKPHOS 61  BILITOT 0.4  PROT 6.2*  ALBUMIN <1.5*   No results for input(s): "LIPASE", "AMYLASE" in the last 168 hours. No results for  input(s): "AMMONIA" in the last 168 hours. Coagulation Profile: No results for input(s): "INR", "PROTIME" in the last 168 hours. Cardiac Enzymes: No results for input(s): "CKTOTAL", "CKMB", "CKMBINDEX", "TROPONINI" in the last 168 hours. BNP (last 3 results) No results for input(s): "PROBNP" in the last 8760 hours. HbA1C: No results for input(s): "HGBA1C" in the last 72 hours. CBG: Recent Labs  Lab 01/17/23 1056 01/17/23 1624 01/17/23 2144 01/18/23 0625 01/18/23 1133  GLUCAP 218* 282* 177* 148* 232*   Lipid Profile: No results for input(s): "CHOL", "HDL", "LDLCALC", "TRIG", "CHOLHDL", "LDLDIRECT" in the last 72 hours. Thyroid Function Tests: No results for input(s): "TSH", "T4TOTAL", "FREET4", "T3FREE", "THYROIDAB" in the last 72 hours. Anemia Panel: No results for input(s): "VITAMINB12", "FOLATE", "FERRITIN", "TIBC", "IRON", "RETICCTPCT" in the last 72 hours. Sepsis Labs: No results for input(s): "PROCALCITON", "LATICACIDVEN" in the last 168 hours.  No results found for this or any previous visit (from the past 240 hour(s)).       Radiology Studies: No results found.      Scheduled Meds:  acetaminophen  500 mg Oral TID   amiodarone  200 mg Oral Daily   azelastine  1 spray Each Nare BID   [START ON 02/19/2023] cefadroxil  1,000 mg Oral BID   Chlorhexidine Gluconate Cloth  6 each Topical Daily   enoxaparin (LOVENOX) injection  90 mg Subcutaneous BID   escitalopram  10 mg Oral Daily   ferrous sulfate  325 mg Oral Q breakfast   fluticasone  2 spray Each Nare Daily   gabapentin  100 mg Oral BID   insulin aspart  0-15 Units Subcutaneous TID  WC   insulin aspart  0-5 Units Subcutaneous QHS   insulin glargine-yfgn  15 Units Subcutaneous QHS   ketotifen  1 drop Left Eye BID   lidocaine  2 patch Transdermal Q24H   loratadine  10 mg Oral Daily   LORazepam  0.5 mg Oral Once   melatonin  3 mg Oral QHS   methocarbamol  500 mg Oral BID   multivitamin with minerals  1 tablet Oral Daily   pantoprazole  40 mg Oral BID   risperiDONE  0.5 mg Oral QHS   rosuvastatin  20 mg Oral Daily   senna  2 tablet Oral Daily   sodium chloride flush  10-40 mL Intracatheter Q12H   sodium chloride flush  3 mL Intravenous Q12H   tamsulosin  0.4 mg Oral Daily   Continuous Infusions:   ceFAZolin (ANCEF) IV 2 g (01/18/23 0525)   lactated ringers       LOS: 25 days    Time spent: 39 min  Alwyn Ren, MD 01/18/2023, 2:12 PM

## 2023-01-18 NOTE — Progress Notes (Signed)
ANTICOAGULATION CONSULT NOTE Pharmacy Consult for  Enoxaparin>>apixaban>>lovenox> heparin  Indication: atrial fibrillation  No Known Allergies  Patient Measurements: Height: 6' (182.9 cm) Weight: 91.3 kg (201 lb 4.5 oz) (bed weight) IBW/kg (Calculated) : 77.6 Heparin Dosing Weight: 93kg  Vital Signs: Temp: 98.1 F (36.7 C) (06/22 1219) Temp Source: Oral (06/22 1219) BP: 124/63 (06/22 1219) Pulse Rate: 89 (06/22 1219)  Labs: Recent Labs    01/16/23 0514 01/18/23 0525  HGB 8.2* 7.7*  HCT 27.4* 26.1*  PLT 155 205  CREATININE 1.29* 1.15     Estimated Creatinine Clearance: 65.6 mL/min (by C-G formula based on SCr of 1.15 mg/dL).  Assessment: 72 y.o. male with new-onset AFib with RVR in the setting of MSSA bacteremia and lumbar spine osteomyelitis and pyogenic ventriculitis. Patient not taking any anticoagulation prior to admission. Pharmacy initially consulted to dose heparin infusion for AFib > changed to enoxaparin > apixaban>enoxaparin > now back to heparin for short acting anticoagulant planning surgery 6/24   Hgb slowly drift down from 9s to 7.7 this AM. Plt count is WNL. Iron defic anemia, on po iron supplement daily w/ bowel regimen as needed.  12/30/22 Vasc US BUE duplex:  bilateral UE acute superficial thrombus;No evidence of DVT. Last dose enoxaparin 6/22am will begin heparin drip this pm and stop on call to OR Monday    Goal of Therapy:  Heparin level 0.3-0.7  Monitor PLTC    Plan:  Stop enoxaparin  Start heparin drip at 8pm drip rate 1250 uts/hr  Daily heparin level and aptt and cbc  Copay cost of apixaban or rivaroxaban is $11.20/month    Leota Sauers Pharm.D. CPP, BCPS Clinical Pharmacist [phone] 01/18/2023 4:02 PM

## 2023-01-19 ENCOUNTER — Inpatient Hospital Stay (HOSPITAL_COMMUNITY): Payer: Medicare HMO

## 2023-01-19 DIAGNOSIS — M4646 Discitis, unspecified, lumbar region: Secondary | ICD-10-CM | POA: Diagnosis not present

## 2023-01-19 HISTORY — PX: IR IVC FILTER PLMT / S&I /IMG GUID/MOD SED: IMG701

## 2023-01-19 LAB — COMPREHENSIVE METABOLIC PANEL
ALT: <5 U/L (ref 0–44)
AST: 12 U/L — ABNORMAL LOW (ref 15–41)
Albumin: <1.5 g/dL — ABNORMAL LOW (ref 3.5–5.0)
Alkaline Phosphatase: 64 U/L (ref 38–126)
Anion gap: 10 (ref 5–15)
BUN: 15 mg/dL (ref 8–23)
CO2: 26 mmol/L (ref 22–32)
Calcium: 8.5 mg/dL — ABNORMAL LOW (ref 8.9–10.3)
Chloride: 100 mmol/L (ref 98–111)
Creatinine, Ser: 1.07 mg/dL (ref 0.61–1.24)
GFR, Estimated: >60 mL/min (ref >60)
Glucose, Bld: 145 mg/dL — ABNORMAL HIGH (ref 70–99)
Potassium: 3.8 mmol/L (ref 3.5–5.1)
Sodium: 136 mmol/L (ref 135–145)
Total Bilirubin: 0.4 mg/dL (ref 0.3–1.2)
Total Protein: 6.2 g/dL — ABNORMAL LOW (ref 6.5–8.1)

## 2023-01-19 LAB — CBC
HCT: 25.8 % — ABNORMAL LOW (ref 39.0–52.0)
Hemoglobin: 7.6 g/dL — ABNORMAL LOW (ref 13.0–17.0)
MCH: 25.4 pg — ABNORMAL LOW (ref 26.0–34.0)
MCHC: 29.5 g/dL — ABNORMAL LOW (ref 30.0–36.0)
MCV: 86.3 fL (ref 80.0–100.0)
Platelets: 217 10*3/uL (ref 150–400)
RBC: 2.99 MIL/uL — ABNORMAL LOW (ref 4.22–5.81)
RDW: 18 % — ABNORMAL HIGH (ref 11.5–15.5)
WBC: 9.5 10*3/uL (ref 4.0–10.5)
nRBC: 0 % (ref 0.0–0.2)

## 2023-01-19 LAB — GLUCOSE, CAPILLARY
Glucose-Capillary: 148 mg/dL — ABNORMAL HIGH (ref 70–99)
Glucose-Capillary: 165 mg/dL — ABNORMAL HIGH (ref 70–99)
Glucose-Capillary: 128 mg/dL — ABNORMAL HIGH (ref 70–99)
Glucose-Capillary: 292 mg/dL — ABNORMAL HIGH (ref 70–99)

## 2023-01-19 LAB — HEPARIN LEVEL (UNFRACTIONATED): Heparin Unfractionated: 0.28 IU/mL — ABNORMAL LOW (ref 0.30–0.70)

## 2023-01-19 LAB — APTT: aPTT: 48 seconds — ABNORMAL HIGH (ref 24–36)

## 2023-01-19 LAB — AMMONIA: Ammonia: 22 umol/L (ref 9–35)

## 2023-01-19 MED ORDER — LIDOCAINE HCL 1 % IJ SOLN
INTRAMUSCULAR | Status: AC
  Filled 2023-01-19: qty 20

## 2023-01-19 MED ORDER — HYDROMORPHONE HCL 1 MG/ML IJ SOLN
INTRAMUSCULAR | Status: AC | PRN
  Administered 2023-01-19: .5 mg via INTRAVENOUS

## 2023-01-19 MED ORDER — HYDROMORPHONE HCL 1 MG/ML IJ SOLN
INTRAMUSCULAR | Status: AC
  Filled 2023-01-19: qty 1

## 2023-01-19 MED ORDER — IOHEXOL 300 MG/ML  SOLN
100.0000 mL | Freq: Once | INTRAMUSCULAR | Status: AC | PRN
  Administered 2023-01-19: 55 mL via INTRAVENOUS

## 2023-01-19 MED ORDER — LIDOCAINE HCL 1 % IJ SOLN
10.0000 mL | Freq: Once | INTRAMUSCULAR | Status: AC
  Administered 2023-01-19: 10 mL via INTRADERMAL

## 2023-01-19 MED ORDER — LACTULOSE 10 GM/15ML PO SOLN
20.0000 g | Freq: Two times a day (BID) | ORAL | Status: AC
  Administered 2023-01-19 (×2): 20 g via ORAL
  Filled 2023-01-19 (×2): qty 30

## 2023-01-19 NOTE — Progress Notes (Addendum)
ANTICOAGULATION CONSULT NOTE Pharmacy Consult for  Enoxaparin>>apixaban>>lovenox> heparin  Indication: atrial fibrillation  No Known Allergies  Patient Measurements: Height: 6' (182.9 cm) Weight: 91.3 kg (201 lb 4.5 oz) (bed weight) IBW/kg (Calculated) : 77.6 Heparin Dosing Weight: 91kg  Vital Signs: Temp: 97.8 F (36.6 C) (06/23 0716) Temp Source: Oral (06/23 0716) BP: 124/101 (06/23 0716) Pulse Rate: 100 (06/23 0716)  Labs: Recent Labs    01/18/23 0525 01/19/23 0512 01/19/23 0642  HGB 7.7* 7.6*  --   HCT 26.1* 25.8*  --   PLT 205 217  --   APTT  --   --  48*  HEPARINUNFRC  --   --  0.28*  CREATININE 1.15 1.07  --      Estimated Creatinine Clearance: 70.5 mL/min (by C-G formula based on SCr of 1.07 mg/dL).  Assessment: 72 y.o. male with new-onset AFib with RVR in the setting of MSSA bacteremia and lumbar spine osteomyelitis and pyogenic ventriculitis. Patient not taking any anticoagulation prior to admission. Pharmacy initially consulted to dose heparin infusion for AFib > changed to enoxaparin > apixaban > enoxaparin > now back to heparin for short acting anticoagulant planning surgery 6/24  Hgb slowly drift down from 9s to 7.6 this AM. Plt count is WNL. Iron deficiency anemia on po iron supplement daily w/ bowel regimen as needed. 12/30/22 Vasc US BUE duplex with bilateral UE acute superficial thrombus and no evidence of DVT. Last dose enoxaparin 6/22 am. Heparin will be held on call to OR Monday   aPTT and heparin level this AM are subtherapeutic. aPTT and heparin level nearly correlate, however heparin level somewhat closer to goal than aPTT. Likely will correlate tomorrow. IVC placed by radiology 6/23.  Goal of Therapy:  Heparin level 0.3-0.7  Monitor PLTC    Plan:  Increase heparin to 1500 units/hr  Check aPTT in 6-8 hours Daily heparin level and aptt and cbc  F/u coag plans post-op Copay cost of apixaban or rivaroxaban is $11.20/month  Thank you for  involving pharmacy in this patient's care.  Enos Fling, PharmD PGY2 Pharmacy Resident 01/19/2023 8:08 AM  ADDENDUM:  Heparin order was pended not entered, level re-timed for 2100 (~7 hours)

## 2023-01-19 NOTE — Progress Notes (Signed)
PROGRESS NOTE    Jim Ramirez  GNF:[id number] DOB: Feb 07, 1951 DOA: 12/24/2022 PCP: [name], MD   Brief Narrative: 72 y.o. male with a history of hypertension and diabetes initially presented to [facility] on 12/22/2022 for chronic back pain radiating both her legs, with some shortness of breath that started 1 week prior to admission CT scan of the abdomen pelvis or lumbar spine discitis and osteomyelitis, transferred to [facility] found to be bacteremic with staph aureus with necrosis of the lumbar spine, also in A-fib with RVR, neurology, ID and cardiology were consulted. MRI of the wrist was done that showed fluid collection and surgery was consulted who recommended conservative management. Despite IV antibiotics he continues to spike fevers.  MRI lumbar spine done on 6/7 showed worsening of infection/concerns of epidural abscess but neurosurgery recommending against any surgical intervention. Initial blood cultures at [facility] grew MSSA. TEE from 6/4 does not show any vegetation but MRI does show small fluid collection, CT surgery recommended conservative management.  Infectious disease recommended IV nafcillin from negative cultures on 5/28 until 01/10/2023, now switched to cefazolin 2 g IV every 8 hourly for additional 6 weeks, thereafter cefadroxil 1000 mg twice daily (starting 7/24) for at least 10 months.    Due to progressive low back pain radiating down legs, MRI repeated 6/7 showing persistent L4, L5 osteonecrosis, probable L4/5 discitis with right L4-5 septic facet, multiple psoas intramuscular loculated fluid collections. Despite continued IV antibiotics, a repeat MRI 6/18 showing new epidural abscess and stenosis. Neurosurgery discussed risks and benefits of surgery at length with the patient who opts to proceed with surgery after eliquis washout on 6/24.   01/19/2023: Patient seen.  No new complaints.  Patient has had IVC filter placed by interventional radiology team.  Possible  surgery tomorrow by the neurosurgery team.  Assessment & Plan:   Principal Problem:   Discitis of lumbar region Active Problems:   Diabetes mellitus type 2 in nonobese (HCC)   Dyslipidemia   Essential hypertension   New onset atrial fibrillation (HCC)   Sepsis (HCC)   BPH (benign prostatic hyperplasia)   Mood disorder (HCC)   MSSA bacteremia   Malnutrition of moderate degree   Severe sepsis secondary to MSSA bacteremia, persistent vertebral osteonecrosis/infarct of L4-L-L5 with discitis/septic arthritis and multiple loculated fluid collection around psoas muscle: He has continued hyperintensity of his L4 and L5 vertebral bodies. There is septic arthritis in his right L4-5 facet. 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 There is spinal stenosis most prominent at L4-5.  - ID has placed antibiotic orders, continuing ancef currently, transition to cefadroxil 7/24.   - Neurosurgery, Dr. Lovell Sheehan, planning surgery on 6/24 after eliquis washout. Discussion of risks/potential benefits of surgery have been discussed between surgeon and patient/HCPOA per neurosurgery NP. 01/19/2023: Patient seen.  Patient remains stable.  For possible surgery tomorrow.   Left wrist edema/concern for tenosynovitis: - MRI shows persistent and worsening abscess of the right wrist, [facility] was consulted, status post bedside aspiration 6/10 but not enough sample.  Grossly did not appear purulent per their documentation. Planning to continue antibiotics.   Acute kidney injury: Resolved.   Osteonecrosis of L4-L5: - Analgesia.    New onset paroxysmal atrial fibrillation: - Continue amiodarone, now down to 200mg  daily, remains in NSR. - Holding eliquis in preparation for neurosurgery, now on lovenox   Iron deficiency anemia:  - Continue iron supplements with bowel regimen   Hypokalemia: Improved.   -  Monitor intermittently   Thrombocytopenia: Likely due to sepsis, resolved      Elevated LFTs and hyperbilirubinemia: - Recheck 6/23   Elevate troponin: -Likely demand ischemia.   -No chest pain.     Constipation: Abd is benign, though opioids are being given.  - Schedule senokot instead of prn only.   BPH: -Continue Flomax.   Diabetes mellitus type 2 with hyperglycemia: -A1c of 8.1% - Continue glargine 15u qHS and SSI, at inpatient goal.   Obstructive sleep apnea: -Continue CPAP at night.   Hyperlipidemia:  - Continue statin   Mood disorder: - Continue lexapro daily, risperdal qHS   Moderate protein caloric malnutrition: - Supplement protein.  LLE ACUTE DVT 6/22 ON lovenox (was on eliquis on hold for surgery 6/24) Consulted IR For ivc filter   Goals of care: - Patient has already been seen by palliative care.  Currently DNR but continue full treatment. Will continue ongoing discussions by palliative regarding goals of care. Overall very poor prognosis, though the only chance at an acceptable quality of life to the patient would require neurosurgery which is planned.    Pressure Injury 01/03/23 Buttocks Right;Left Stage 2 -  Partial thickness loss of dermis presenting as a shallow open injury with a red, pink wound bed without slough. 1x1 (Active)  01/03/23 2100  Location: Buttocks  Location Orientation: Right;Left  Staging: Stage 2 -  Partial thickness loss of dermis presenting as a shallow open injury with a red, pink wound bed without slough.  Wound Description (Comments): 1x1  Present on Admission: No  Dressing Type Foam - Lift dressing to assess site every shift 01/19/23 0715     Pressure Injury 01/18/23 Heel Left Stage 1 -  Intact skin with non-blanchable redness of a localized area usually over a bony prominence. (Active)  01/18/23 1337  Location: Heel  Location Orientation: Left  Staging: Stage 1 -  Intact skin with non-blanchable redness of a localized area usually over a bony prominence.  Wound Description (Comments):   Present on  Admission:   Dressing Type Foam - Lift dressing to assess site every shift 01/19/23 0715     Pressure Injury 01/19/23 Heel Right Stage 1 -  Intact skin with non-blanchable redness of a localized area usually over a bony prominence. (Active)  01/19/23 1007  Location: Heel  Location Orientation: Right  Staging: Stage 1 -  Intact skin with non-blanchable redness of a localized area usually over a bony prominence.  Wound Description (Comments):   Present on Admission:   Dressing Type Foam - Lift dressing to assess site every shift 01/19/23 0715      Nutrition Problem: Moderate Malnutrition Etiology: acute illness     Signs/Symptoms: energy intake < 75% for > 7 days, mild muscle depletion, mild fat depletion    Interventions: Refer to RD note for recommendations  Estimated body mass index is 27.3 kg/m as calculated from the following:   Height as of this encounter: 6' (1.829 m).   Weight as of this encounter: 91.3 kg.  DVT prophylaxis:lovenox Code Status:DNR Family Communication:NONE Disposition Plan:  Status is: Inpatient Remains inpatient appropriate because: SPINAL ABSCESS   Consultants:  ID NEURO SURGERY Antimicrobials: Anti-infectives (From admission, onward)    Start     Dose/Rate Route Frequency Ordered Stop   02/19/23 1000  cefadroxil (DURICEF) capsule 1,000 mg        1,000 mg Oral 2 times daily 01/13/23 1117 11/26/23 0959   01/11/23 0600  ceFAZolin (ANCEF) IVPB 2g/100  mL premix        2 g 200 mL/hr over 30 Minutes Intravenous Every 8 hours 12/31/22 1449 02/18/23 2359   12/28/22 1015  nafcillin 12 g in sodium chloride 0.9 % 500 mL continuous infusion        12 g 20.8 mL/hr over 24 Hours Intravenous Every 24 hours 12/28/22 0925 01/11/23 1245   12/28/22 0830  nafcillin 2 g in sodium chloride 0.9 % 100 mL IVPB  Status:  Discontinued        2 g 216 mL/hr over 30 Minutes Intravenous Every 4 hours 12/28/22 0736 12/28/22 0925   12/26/22 1400  ceFAZolin (ANCEF) IVPB  2g/100 mL premix  Status:  Discontinued        2 g 200 mL/hr over 30 Minutes Intravenous Every 8 hours 12/26/22 0823 12/28/22 0736   12/25/22 2200  vancomycin (VANCOREADY) IVPB 1750 mg/350 mL  Status:  Discontinued        1,750 mg 175 mL/hr over 120 Minutes Intravenous Every 24 hours 12/25/22 0919 12/26/22 0823   12/25/22 1200  cefTRIAXone (ROCEPHIN) 2 g in sodium chloride 0.9 % 100 mL IVPB  Status:  Discontinued        2 g 200 mL/hr over 30 Minutes Intravenous Every 24 hours 12/25/22 0915 12/25/22 0935   12/24/22 2000  ceFEPIme (MAXIPIME) 2 g in sodium chloride 0.9 % 100 mL IVPB  Status:  Discontinued        2 g 200 mL/hr over 30 Minutes Intravenous Every 8 hours 12/24/22 1856 12/25/22 0837   12/24/22 2000  vancomycin (VANCOREADY) IVPB 1750 mg/350 mL  Status:  Discontinued        1,750 mg 175 mL/hr over 120 Minutes Intravenous Every 24 hours 12/24/22 1857 12/25/22 0837   12/24/22 1930  metroNIDAZOLE (FLAGYL) IVPB 500 mg  Status:  Discontinued        500 mg 100 mL/hr over 60 Minutes Intravenous Every 12 hours 12/24/22 1830 12/25/22 0837        Subjective: Alb less than 1.5 Resting in bed  C/o pain back and legs   Objective: Vitals:   01/19/23 0716 01/19/23 0800 01/19/23 1108 01/19/23 1120  BP: (!) 124/101  133/74 123/65  Pulse: 100  99 96  Resp: 15  19 17   Temp: 97.8 F (36.6 C)     TempSrc: Oral     SpO2: 96%  94% 94%  Weight:  91.3 kg    Height:  6' (1.829 m)      Intake/Output Summary (Last 24 hours) at 01/19/2023 1139 Last data filed at 01/19/2023 0847 Gross per 24 hour  Intake 647.2 ml  Output 1250 ml  Net -602.8 ml    Filed Weights   01/05/23 0346 01/14/23 1241 01/19/23 0800  Weight: 86.5 kg 91.3 kg 91.3 kg    Examination:  General exam: Not in any distress.  Patient is awake and alert.  Patient is pale.  No jaundice.   Respiratory system: Clear to auscultation. Respiratory effort normal. Cardiovascular system: S1 & S2 heard. Gastrointestinal system:  Abdomen is soft and nontender.   Central nervous system: Alert and oriented.  Extremities: Swelling of left lower extremity.   Data Reviewed: I have personally reviewed following labs and imaging studies  CBC: Recent Labs  Lab 01/16/23 0514 01/18/23 0525 01/19/23 0512  WBC 9.4 9.7 9.5  HGB 8.2* 7.7* 7.6*  HCT 27.4* 26.1* 25.8*  MCV 84.3 85.9 86.3  PLT 155 205 217    Basic  Metabolic Panel: Recent Labs  Lab 01/16/23 0514 01/18/23 0525 01/19/23 0512  NA 138 138 136  K 3.5 3.5 3.8  CL 104 101 100  CO2 27 26 26   GLUCOSE 143* 138* 145*  BUN 18 16 15   CREATININE 1.29* 1.15 1.07  CALCIUM 8.4* 8.7* 8.5*    GFR: Estimated Creatinine Clearance: 70.5 mL/min (by C-G formula based on SCr of 1.07 mg/dL). Liver Function Tests: Recent Labs  Lab 01/18/23 0525 01/19/23 0512  AST 13* 12*  ALT 6 <5  ALKPHOS 61 64  BILITOT 0.4 0.4  PROT 6.2* 6.2*  ALBUMIN <1.5* <1.5*    No results for input(s): "LIPASE", "AMYLASE" in the last 168 hours. Recent Labs  Lab 01/19/23 0512  AMMONIA 22   Coagulation Profile: No results for input(s): "INR", "PROTIME" in the last 168 hours. Cardiac Enzymes: No results for input(s): "CKTOTAL", "CKMB", "CKMBINDEX", "TROPONINI" in the last 168 hours. BNP (last 3 results) No results for input(s): "PROBNP" in the last 8760 hours. HbA1C: No results for input(s): "HGBA1C" in the last 72 hours. CBG: Recent Labs  Lab 01/18/23 0625 01/18/23 1133 01/18/23 1603 01/18/23 2113 01/19/23 0604  GLUCAP 148* 232* 215* 155* 148*    Lipid Profile: No results for input(s): "CHOL", "HDL", "LDLCALC", "TRIG", "CHOLHDL", "LDLDIRECT" in the last 72 hours. Thyroid Function Tests: No results for input(s): "TSH", "T4TOTAL", "FREET4", "T3FREE", "THYROIDAB" in the last 72 hours. Anemia Panel: No results for input(s): "VITAMINB12", "FOLATE", "FERRITIN", "TIBC", "IRON", "RETICCTPCT" in the last 72 hours. Sepsis Labs: No results for input(s): "PROCALCITON",  "LATICACIDVEN" in the last 168 hours.  No results found for this or any previous visit (from the past 240 hour(s)).       Radiology Studies: VAS [place] LOWER EXTREMITY VENOUS (DVT)  Result Date: 01/18/2023  Lower Venous DVT Study Patient Name:  Jim Ramirez  Date of Exam:   01/18/2023 Medical Rec #: [id number]         Accession #:    [id number] Date of Birth: [birth year]         Patient Gender: M Patient Age:   13 years Exam Location:  [facility] Procedure:      VAS [place] LOWER EXTREMITY VENOUS (DVT) Referring Phys: Huntley Dec TOMLINSON --------------------------------------------------------------------------------  Indications: Edema.  Risk Factors: DVT 2022 RLE (peroneal vein). Comparison Study: Previous exam on 12/28/22 was negative for DVT. Performing Technologist: Ernestene Mention RVT, RDMS  Examination Guidelines: A complete evaluation includes B-mode imaging, spectral Doppler, color Doppler, and power Doppler as needed of all accessible portions of each vessel. Bilateral testing is considered an integral part of a complete examination. Limited examinations for reoccurring indications may be performed as noted. The reflux portion of the exam is performed with the patient in reverse Trendelenburg.  +---------+---------------+---------+-----------+----------+--------------+ RIGHT    CompressibilityPhasicitySpontaneityPropertiesThrombus Aging +---------+---------------+---------+-----------+----------+--------------+ CFV      Full           Yes      No                                  +---------+---------------+---------+-----------+----------+--------------+ SFJ      Full                                                        +---------+---------------+---------+-----------+----------+--------------+  FV Prox  Full           Yes      Yes                                 +---------+---------------+---------+-----------+----------+--------------+ FV Mid   Full           Yes      Yes                                  +---------+---------------+---------+-----------+----------+--------------+ FV DistalFull           Yes      Yes                                 +---------+---------------+---------+-----------+----------+--------------+ PFV      Full                                                        +---------+---------------+---------+-----------+----------+--------------+ POP      Full           Yes      Yes                                 +---------+---------------+---------+-----------+----------+--------------+ PTV      Full                                                        +---------+---------------+---------+-----------+----------+--------------+ PERO     Full                                                        +---------+---------------+---------+-----------+----------+--------------+   +---------+---------------+---------+-----------+----------+------------------+ LEFT     CompressibilityPhasicitySpontaneityPropertiesThrombus Aging     +---------+---------------+---------+-----------+----------+------------------+ CFV      Full           Yes      Yes                                     +---------+---------------+---------+-----------+----------+------------------+ SFJ      Full                                                            +---------+---------------+---------+-----------+----------+------------------+ FV Prox  Full           Yes      Yes                                     +---------+---------------+---------+-----------+----------+------------------+  FV Mid   Full           Yes      Yes                                     +---------+---------------+---------+-----------+----------+------------------+ FV DistalFull           Yes      Yes                                     +---------+---------------+---------+-----------+----------+------------------+ PFV      Full                                                             +---------+---------------+---------+-----------+----------+------------------+ POP      Full           Yes      Yes                                     +---------+---------------+---------+-----------+----------+------------------+ PTV      None           No       No                   Acute              +---------+---------------+---------+-----------+----------+------------------+ PERO     Full                                                            +---------+---------------+---------+-----------+----------+------------------+ Gastroc  None           No       No                   acute - one of                                                           paired             +---------+---------------+---------+-----------+----------+------------------+     Summary: BILATERAL: -No evidence of popliteal cyst, bilaterally. -Subcutaneous edema, bilaterally. RIGHT: - There is no evidence of deep vein thrombosis in the lower extremity.  LEFT: - Findings consistent with acute deep vein thrombosis involving the left gastrocnemius veins, and left posterior tibial veins.  *See table(s) above for measurements and observations. Electronically signed by Waverly Ferrari MD on 01/18/2023 at 7:14:07 PM.    Final         Scheduled Meds:  acetaminophen  500 mg Oral TID   amiodarone  200 mg Oral Daily   azelastine  1 spray Each Nare BID   [START ON 02/19/2023] cefadroxil  1,000 mg Oral BID  Chlorhexidine Gluconate Cloth  6 each Topical Daily   diclofenac Sodium  2 g Topical QID   escitalopram  10 mg Oral Daily   ferrous sulfate  325 mg Oral Q breakfast   fluticasone  2 spray Each Nare Daily   gabapentin  100 mg Oral BID   insulin aspart  0-15 Units Subcutaneous TID WC   insulin aspart  0-5 Units Subcutaneous QHS   insulin glargine-yfgn  15 Units Subcutaneous QHS   ketotifen  1 drop Left Eye BID   lactulose  20 g Oral BID   lidocaine   2 patch Transdermal Q24H   loratadine  10 mg Oral Daily   LORazepam  0.5 mg Oral Once   melatonin  3 mg Oral QHS   methocarbamol  500 mg Oral BID   multivitamin with minerals  1 tablet Oral Daily   pantoprazole  40 mg Oral BID   risperiDONE  0.5 mg Oral QHS   rosuvastatin  20 mg Oral Daily   senna  2 tablet Oral Daily   sodium chloride flush  10-40 mL Intracatheter Q12H   sodium chloride flush  3 mL Intravenous Q12H   tamsulosin  0.4 mg Oral Daily   Continuous Infusions:   ceFAZolin (ANCEF) IV 2 g (01/19/23 0521)   heparin 1,250 Units/hr (01/19/23 0442)   lactated ringers       LOS: 26 days    Time spent: 35 min  Barnetta Chapel, MD 01/19/2023, 11:39 AM

## 2023-01-19 NOTE — Consult Note (Signed)
IR was requested for IVC filter placement.   Patient is a 72 yo male with SAH in 2018, A fib on Eliquis, DM, epidural abscess and bacteremia who failed conservative management, neurosurgery was consulted and he is scheduled for surgery after Eliquis washout.  Bilateral LE duplex obtained yesterday which showed acute DVT  involving the left gastrocnemius veins, and left posterior tibial veins. Patient was started on heparin infusion, IR was consulted for possible IVC filter placement.   Case reviewed and approved by Dr. Archer Asa, patient had breakfast this morning.  Patient seen in room, laying in bed sleeping. RN at bedside.  He was informed that the procedure will be performed with local anesthetic only, at first patient was hesitant but he ultimately agreed to proceed, he will receive pain medicine before he comes down to IR.  Risks and benefits discussed with the patient including, but not limited to bleeding, infection, contrast induced renal failure, filter fracture or migration which can lead to emergency surgery or even death, strut penetration with damage or irritation to adjacent structures and caval thrombosis.  All of the patient's questions were answered, patient is agreeable to proceed. Consent signed and in chart.  VSS RF wnl, no known allergies.  On heparin gtt, no need for d/c.   Will proceed with IVC filter placement w/o sedation today.    Lynann Bologna Jim Jewel PA-C 01/19/2023 10:24 AM

## 2023-01-19 NOTE — Plan of Care (Signed)
  Problem: Clinical Measurements: Goal: Ability to maintain clinical measurements within normal limits will improve Outcome: Progressing Goal: Will remain free from infection Outcome: Progressing Goal: Respiratory complications will improve Outcome: Progressing Goal: Cardiovascular complication will be avoided Outcome: Progressing   

## 2023-01-19 NOTE — Procedures (Signed)
Interventional Radiology Procedure Note  Procedure: Placement of potentially retrievable IVC filter  Complications: None  Estimated Blood Loss: None  Recommendations: - Bedrest x 2 hrs - IR will follow for filter retrieval   Signed,  Sterling Big, MD

## 2023-01-19 NOTE — Progress Notes (Signed)
Subjective: NAEs o/n.    Objective: BP (!) 124/101 (BP Location: Right Arm)   Pulse 100   Temp 97.8 F (36.6 C) (Oral)   Resp 15   Ht 6' (1.829 m)   Wt 91.3 kg   SpO2 96%   BMI 27.30 kg/m    Intake/Output Summary (Last 24 hours) at 01/19/2023 0830 Last data filed at 01/19/2023 6213 Gross per 24 hour  Intake 527.2 ml  Output 1250 ml  Net -722.8 ml      Alert and oriented x 4 MAE, Mild generalized weakness likely secondary to deconditioning LLE swelling.     Assessment/Plan: Patient admitted with significant back pain with radiation down BLE. He was septic with elevated troponin and a-fib with RVR. 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    LOS: 26 days    -LE Doppler 01/18/23 positive for acute LLE DVT. Defer tx to medicine team, consider IVC filter. -Plan for surgery on 01/20/2023 with Dr. Lovell Sheehan will likely need to be reevaluated.     Call w/ questions/concerns.  Patrici Ranks, [facility]

## 2023-01-20 ENCOUNTER — Encounter (HOSPITAL_COMMUNITY): Payer: Self-pay | Admitting: Internal Medicine

## 2023-01-20 ENCOUNTER — Other Ambulatory Visit: Payer: Self-pay

## 2023-01-20 ENCOUNTER — Inpatient Hospital Stay (HOSPITAL_COMMUNITY): Payer: Medicare HMO

## 2023-01-20 ENCOUNTER — Encounter (HOSPITAL_COMMUNITY)
Disposition: A | Discharge status: Skilled Nursing Facility | Payer: Self-pay | Source: Other Acute Inpatient Hospital | Attending: Internal Medicine

## 2023-01-20 DIAGNOSIS — M4626 Osteomyelitis of vertebra, lumbar region: Secondary | ICD-10-CM

## 2023-01-20 DIAGNOSIS — M48061 Spinal stenosis, lumbar region without neurogenic claudication: Secondary | ICD-10-CM

## 2023-01-20 DIAGNOSIS — Z87891 Personal history of nicotine dependence: Secondary | ICD-10-CM

## 2023-01-20 DIAGNOSIS — I10 Essential (primary) hypertension: Secondary | ICD-10-CM

## 2023-01-20 DIAGNOSIS — M4646 Discitis, unspecified, lumbar region: Secondary | ICD-10-CM | POA: Diagnosis not present

## 2023-01-20 DIAGNOSIS — G061 Intraspinal abscess and granuloma: Secondary | ICD-10-CM

## 2023-01-20 DIAGNOSIS — M47816 Spondylosis without myelopathy or radiculopathy, lumbar region: Secondary | ICD-10-CM

## 2023-01-20 HISTORY — PX: LUMBAR LAMINECTOMY/DECOMPRESSION MICRODISCECTOMY: SHX5026

## 2023-01-20 LAB — SURGICAL PCR SCREEN
MRSA, PCR: NEGATIVE
Staphylococcus aureus: NEGATIVE

## 2023-01-20 LAB — CBC
HCT: 26 % — ABNORMAL LOW (ref 39.0–52.0)
Hemoglobin: 7.9 g/dL — ABNORMAL LOW (ref 13.0–17.0)
MCH: 26.4 pg (ref 26.0–34.0)
MCHC: 30.4 g/dL (ref 30.0–36.0)
MCV: 87 fL (ref 80.0–100.0)
Platelets: 223 10*3/uL (ref 150–400)
RBC: 2.99 MIL/uL — ABNORMAL LOW (ref 4.22–5.81)
RDW: 18.5 % — ABNORMAL HIGH (ref 11.5–15.5)
WBC: 10.9 10*3/uL — ABNORMAL HIGH (ref 4.0–10.5)
nRBC: 0 % (ref 0.0–0.2)

## 2023-01-20 LAB — GLUCOSE, CAPILLARY
Glucose-Capillary: 164 mg/dL — ABNORMAL HIGH (ref 70–99)
Glucose-Capillary: 133 mg/dL — ABNORMAL HIGH (ref 70–99)
Glucose-Capillary: 103 mg/dL — ABNORMAL HIGH (ref 70–99)
Glucose-Capillary: 133 mg/dL — ABNORMAL HIGH (ref 70–99)
Glucose-Capillary: 164 mg/dL — ABNORMAL HIGH (ref 70–99)

## 2023-01-20 LAB — TYPE AND SCREEN: Unit division: 0

## 2023-01-20 LAB — APTT
aPTT: 43 seconds — ABNORMAL HIGH (ref 24–36)
aPTT: 56 seconds — ABNORMAL HIGH (ref 24–36)

## 2023-01-20 LAB — BPAM RBC
Blood Product Expiration Date: 202407172359
ISSUE DATE / TIME: 202406241925

## 2023-01-20 LAB — PREPARE RBC (CROSSMATCH)

## 2023-01-20 LAB — HEPARIN LEVEL (UNFRACTIONATED): Heparin Unfractionated: 0.22 IU/mL — ABNORMAL LOW (ref 0.30–0.70)

## 2023-01-20 LAB — AEROBIC/ANAEROBIC CULTURE W GRAM STAIN (SURGICAL/DEEP WOUND)

## 2023-01-20 SURGERY — LUMBAR LAMINECTOMY/DECOMPRESSION MICRODISCECTOMY 3 LEVELS
Anesthesia: General | Site: Back

## 2023-01-20 MED ORDER — SURGIPHOR WOUND IRRIGATION SYSTEM - OPTIME: TOPICAL | Status: DC | PRN

## 2023-01-20 MED ORDER — LACTATED RINGERS IV SOLN: INTRAVENOUS | Status: DC

## 2023-01-20 MED ORDER — SODIUM CHLORIDE 0.9 % IV SOLN: 10.0000 mL/h | Freq: Once | INTRAVENOUS | Status: DC

## 2023-01-20 MED ORDER — BACITRACIN ZINC 500 UNIT/GM EX OINT
TOPICAL_OINTMENT | CUTANEOUS | Status: AC
  Filled 2023-01-20: qty 28.35

## 2023-01-20 MED ORDER — PROPOFOL 10 MG/ML IV BOLUS
INTRAVENOUS | Status: AC
  Filled 2023-01-20: qty 20

## 2023-01-20 MED ORDER — PHENYLEPHRINE HCL-NACL 20-0.9 MG/250ML-% IV SOLN
INTRAVENOUS | Status: DC | PRN
  Administered 2023-01-20: 30 ug/min via INTRAVENOUS

## 2023-01-20 MED ORDER — SODIUM CHLORIDE 0.9 % IV SOLN: 250.0000 mL | INTRAVENOUS | Status: DC

## 2023-01-20 MED ORDER — THROMBIN 5000 UNITS EX SOLR
CUTANEOUS | Status: AC
  Filled 2023-01-20: qty 5000

## 2023-01-20 MED ORDER — CEFAZOLIN SODIUM-DEXTROSE 2-4 GM/100ML-% IV SOLN
2.0000 g | INTRAVENOUS | Status: AC
  Administered 2023-01-20: 2 g via INTRAVENOUS
  Filled 2023-01-20: qty 100

## 2023-01-20 MED ORDER — SODIUM CHLORIDE 0.9% FLUSH
3.0000 mL | Freq: Two times a day (BID) | INTRAVENOUS | Status: DC
  Administered 2023-01-21 – 2023-01-25 (×8): 3 mL via INTRAVENOUS

## 2023-01-20 MED ORDER — ONDANSETRON HCL 4 MG/2ML IJ SOLN
INTRAMUSCULAR | Status: AC
  Filled 2023-01-20: qty 2

## 2023-01-20 MED ORDER — ACETAMINOPHEN 160 MG/5ML PO SOLN: 325.0000 mg | Freq: Once | ORAL | Status: DC | PRN

## 2023-01-20 MED ORDER — MEPERIDINE HCL 25 MG/ML IJ SOLN: 6.2500 mg | INTRAMUSCULAR | Status: DC | PRN

## 2023-01-20 MED ORDER — FENTANYL CITRATE (PF) 250 MCG/5ML IJ SOLN
INTRAMUSCULAR | Status: DC | PRN
  Administered 2023-01-20: 50 ug via INTRAVENOUS
  Administered 2023-01-20 (×2): 25 ug via INTRAVENOUS

## 2023-01-20 MED ORDER — DOCUSATE SODIUM 100 MG PO CAPS
100.0000 mg | ORAL_CAPSULE | Freq: Two times a day (BID) | ORAL | Status: DC
  Administered 2023-01-21 – 2023-01-28 (×8): 100 mg via ORAL
  Filled 2023-01-20 (×11): qty 1

## 2023-01-20 MED ORDER — MORPHINE SULFATE (PF) 2 MG/ML IV SOLN: 4.0000 mg | INTRAVENOUS | Status: DC | PRN

## 2023-01-20 MED ORDER — OXYCODONE HCL 5 MG PO TABS
10.0000 mg | ORAL_TABLET | ORAL | Status: DC | PRN
  Administered 2023-01-23 – 2023-01-25 (×5): 10 mg via ORAL
  Filled 2023-01-20 (×5): qty 2

## 2023-01-20 MED ORDER — PHENYLEPHRINE 80 MCG/ML (10ML) SYRINGE FOR IV PUSH (FOR BLOOD PRESSURE SUPPORT)
PREFILLED_SYRINGE | INTRAVENOUS | Status: DC | PRN
  Administered 2023-01-20 (×2): 160 ug via INTRAVENOUS

## 2023-01-20 MED ORDER — ALBUMIN HUMAN 5 % IV SOLN: INTRAVENOUS | Status: DC | PRN

## 2023-01-20 MED ORDER — MIDAZOLAM HCL 2 MG/2ML IJ SOLN
INTRAMUSCULAR | Status: DC | PRN
  Administered 2023-01-20: 1 mg via INTRAVENOUS

## 2023-01-20 MED ORDER — PROPOFOL 10 MG/ML IV BOLUS
INTRAVENOUS | Status: DC | PRN
  Administered 2023-01-20: 80 mg via INTRAVENOUS

## 2023-01-20 MED ORDER — SODIUM CHLORIDE 0.9% FLUSH: 3.0000 mL | INTRAVENOUS | Status: DC | PRN

## 2023-01-20 MED ORDER — CYCLOBENZAPRINE HCL 10 MG PO TABS
10.0000 mg | ORAL_TABLET | Freq: Three times a day (TID) | ORAL | Status: DC | PRN
  Administered 2023-01-22 – 2023-01-25 (×7): 10 mg via ORAL
  Filled 2023-01-20 (×7): qty 1

## 2023-01-20 MED ORDER — BISACODYL 10 MG RE SUPP: 10.0000 mg | Freq: Every day | RECTAL | Status: DC | PRN

## 2023-01-20 MED ORDER — SODIUM CHLORIDE 0.9 % IV SOLN: INTRAVENOUS | Status: DC | PRN

## 2023-01-20 MED ORDER — ROCURONIUM BROMIDE 10 MG/ML (PF) SYRINGE
PREFILLED_SYRINGE | INTRAVENOUS | Status: AC
  Filled 2023-01-20: qty 10

## 2023-01-20 MED ORDER — ALBUTEROL SULFATE (2.5 MG/3ML) 0.083% IN NEBU
INHALATION_SOLUTION | RESPIRATORY_TRACT | Status: AC
  Filled 2023-01-20: qty 3

## 2023-01-20 MED ORDER — CHLORHEXIDINE GLUCONATE 0.12 % MT SOLN: 15.0000 mL | Freq: Once | OROMUCOSAL | Status: AC

## 2023-01-20 MED ORDER — AMISULPRIDE (ANTIEMETIC) 5 MG/2ML IV SOLN: 10.0000 mg | Freq: Once | INTRAVENOUS | Status: DC | PRN

## 2023-01-20 MED ORDER — DEXAMETHASONE SODIUM PHOSPHATE 10 MG/ML IJ SOLN
INTRAMUSCULAR | Status: AC
  Filled 2023-01-20: qty 1

## 2023-01-20 MED ORDER — ROCURONIUM BROMIDE 10 MG/ML (PF) SYRINGE
PREFILLED_SYRINGE | INTRAVENOUS | Status: DC | PRN
  Administered 2023-01-20 (×2): 20 mg via INTRAVENOUS
  Administered 2023-01-20: 60 mg via INTRAVENOUS

## 2023-01-20 MED ORDER — ONDANSETRON HCL 4 MG PO TABS: 4.0000 mg | ORAL_TABLET | Freq: Four times a day (QID) | ORAL | Status: DC | PRN

## 2023-01-20 MED ORDER — BUPIVACAINE-EPINEPHRINE (PF) 0.25% -1:200000 IJ SOLN
INTRAMUSCULAR | Status: AC
  Filled 2023-01-20: qty 30

## 2023-01-20 MED ORDER — FENTANYL CITRATE (PF) 250 MCG/5ML IJ SOLN
INTRAMUSCULAR | Status: AC
  Filled 2023-01-20: qty 5

## 2023-01-20 MED ORDER — ACETAMINOPHEN 650 MG RE SUPP: 650.0000 mg | RECTAL | Status: DC | PRN

## 2023-01-20 MED ORDER — BUPIVACAINE-EPINEPHRINE 0.25% -1:200000 IJ SOLN
INTRAMUSCULAR | Status: DC | PRN
  Administered 2023-01-20: 10 mL

## 2023-01-20 MED ORDER — CHLORHEXIDINE GLUCONATE 0.12 % MT SOLN
OROMUCOSAL | Status: AC
  Administered 2023-01-20: 15 mL via OROMUCOSAL
  Filled 2023-01-20: qty 15

## 2023-01-20 MED ORDER — MIDAZOLAM HCL 2 MG/2ML IJ SOLN
INTRAMUSCULAR | Status: AC
  Filled 2023-01-20: qty 2

## 2023-01-20 MED ORDER — HYDROMORPHONE HCL 1 MG/ML IJ SOLN: 0.2500 mg | INTRAMUSCULAR | Status: DC | PRN

## 2023-01-20 MED ORDER — SUGAMMADEX SODIUM 200 MG/2ML IV SOLN
INTRAVENOUS | Status: DC | PRN
  Administered 2023-01-20: 200 mg via INTRAVENOUS

## 2023-01-20 MED ORDER — CEFAZOLIN SODIUM-DEXTROSE 2-4 GM/100ML-% IV SOLN
2.0000 g | Freq: Three times a day (TID) | INTRAVENOUS | Status: DC
Start: 2023-01-20 — End: 2023-01-20

## 2023-01-20 MED ORDER — LIDOCAINE 2% (20 MG/ML) 5 ML SYRINGE
INTRAMUSCULAR | Status: AC
  Filled 2023-01-20: qty 5

## 2023-01-20 MED ORDER — ACETAMINOPHEN 500 MG PO TABS
1000.0000 mg | ORAL_TABLET | Freq: Four times a day (QID) | ORAL | Status: AC
  Administered 2023-01-21 (×3): 1000 mg via ORAL
  Filled 2023-01-20 (×3): qty 2

## 2023-01-20 MED ORDER — PROMETHAZINE HCL 25 MG/ML IJ SOLN: 6.2500 mg | INTRAMUSCULAR | Status: DC | PRN

## 2023-01-20 MED ORDER — ACETAMINOPHEN 325 MG PO TABS: 650.0000 mg | ORAL_TABLET | ORAL | Status: DC | PRN

## 2023-01-20 MED ORDER — MENTHOL 3 MG MT LOZG: 1.0000 | LOZENGE | OROMUCOSAL | Status: DC | PRN

## 2023-01-20 MED ORDER — OXYCODONE HCL 5 MG PO TABS
5.0000 mg | ORAL_TABLET | ORAL | Status: DC | PRN
  Administered 2023-01-22 – 2023-01-27 (×9): 5 mg via ORAL
  Filled 2023-01-20 (×9): qty 1

## 2023-01-20 MED ORDER — PHENOL 1.4 % MT LIQD: 1.0000 | OROMUCOSAL | Status: DC | PRN

## 2023-01-20 MED ORDER — CHLORHEXIDINE GLUCONATE CLOTH 2 % EX PADS: 6.0000 | MEDICATED_PAD | Freq: Once | CUTANEOUS | Status: DC

## 2023-01-20 MED ORDER — THROMBIN 5000 UNITS EX SOLR: OROMUCOSAL | Status: DC | PRN

## 2023-01-20 MED ORDER — ALBUTEROL SULFATE (2.5 MG/3ML) 0.083% IN NEBU
2.5000 mg | INHALATION_SOLUTION | Freq: Once | RESPIRATORY_TRACT | Status: AC
  Administered 2023-01-20: 2.5 mg via RESPIRATORY_TRACT

## 2023-01-20 MED ORDER — ONDANSETRON HCL 4 MG/2ML IJ SOLN: 4.0000 mg | Freq: Four times a day (QID) | INTRAMUSCULAR | Status: DC | PRN

## 2023-01-20 MED ORDER — ONDANSETRON HCL 4 MG/2ML IJ SOLN
INTRAMUSCULAR | Status: DC | PRN
  Administered 2023-01-20: 4 mg via INTRAVENOUS

## 2023-01-20 MED ORDER — LIDOCAINE 2% (20 MG/ML) 5 ML SYRINGE
INTRAMUSCULAR | Status: DC | PRN
  Administered 2023-01-20: 100 mg via INTRAVENOUS

## 2023-01-20 MED ORDER — DEXAMETHASONE SODIUM PHOSPHATE 10 MG/ML IJ SOLN
INTRAMUSCULAR | Status: DC | PRN
  Administered 2023-01-20: 10 mg via INTRAVENOUS

## 2023-01-20 MED ORDER — PHENYLEPHRINE HCL-NACL 20-0.9 MG/250ML-% IV SOLN
INTRAVENOUS | Status: AC
  Filled 2023-01-20: qty 250

## 2023-01-20 MED ORDER — 0.9 % SODIUM CHLORIDE (POUR BTL) OPTIME
TOPICAL | Status: DC | PRN
  Administered 2023-01-20: 1000 mL

## 2023-01-20 MED ORDER — ACETAMINOPHEN 325 MG PO TABS: 325.0000 mg | ORAL_TABLET | Freq: Once | ORAL | Status: DC | PRN

## 2023-01-20 MED ORDER — ACETAMINOPHEN 10 MG/ML IV SOLN: 1000.0000 mg | Freq: Once | INTRAVENOUS | Status: DC | PRN

## 2023-01-20 MED ORDER — BACITRACIN ZINC 500 UNIT/GM EX OINT
TOPICAL_OINTMENT | CUTANEOUS | Status: DC | PRN
  Administered 2023-01-20: 1 via TOPICAL

## 2023-01-20 MED ORDER — PHENYLEPHRINE 80 MCG/ML (10ML) SYRINGE FOR IV PUSH (FOR BLOOD PRESSURE SUPPORT)
PREFILLED_SYRINGE | INTRAVENOUS | Status: AC
  Filled 2023-01-20: qty 10

## 2023-01-20 MED ORDER — ORAL CARE MOUTH RINSE: 15.0000 mL | Freq: Once | OROMUCOSAL | Status: AC

## 2023-01-20 SURGICAL SUPPLY — 48 items
APL SKNCLS STERI-STRIP NONHPOA (GAUZE/BANDAGES/DRESSINGS) ×1
BAG COUNTER SPONGE SURGICOUNT (BAG) ×1 IMPLANT
BAG SPNG CNTER NS LX DISP (BAG) ×1
BENZOIN TINCTURE PRP APPL 2/3 (GAUZE/BANDAGES/DRESSINGS) ×1 IMPLANT
BLADE CLIPPER SURG (BLADE) IMPLANT
BUR MATCHSTICK NEURO 3.0 LAGG (BURR) ×1 IMPLANT
BUR PRECISION FLUTE 6.0 (BURR) ×1 IMPLANT
CANISTER SUCT 3000ML PPV (MISCELLANEOUS) ×1 IMPLANT
DRAPE LAPAROTOMY 100X72X124 (DRAPES) ×1 IMPLANT
DRAPE MICROSCOPE SLANT 54X150 (MISCELLANEOUS) ×1 IMPLANT
DRAPE SURG 17X23 STRL (DRAPES) ×4 IMPLANT
DRSG OPSITE POSTOP 4X6 (GAUZE/BANDAGES/DRESSINGS) ×1 IMPLANT
ELECT BLADE 4.0 EZ CLEAN MEGAD (MISCELLANEOUS) ×1
ELECT REM PT RETURN 9FT ADLT (ELECTROSURGICAL) ×1
ELECTRODE BLDE 4.0 EZ CLN MEGD (MISCELLANEOUS) ×1 IMPLANT
ELECTRODE REM PT RTRN 9FT ADLT (ELECTROSURGICAL) ×1 IMPLANT
GAUZE 4X4 16PLY ~~LOC~~+RFID DBL (SPONGE) IMPLANT
GAUZE SPONGE 4X4 12PLY STRL (GAUZE/BANDAGES/DRESSINGS) ×1 IMPLANT
GLOVE BIO SURGEON STRL SZ 6 (GLOVE) ×1 IMPLANT
GLOVE BIO SURGEON STRL SZ8 (GLOVE) ×1 IMPLANT
GLOVE BIO SURGEON STRL SZ8.5 (GLOVE) ×1 IMPLANT
GLOVE BIOGEL PI IND STRL 6.5 (GLOVE) ×1 IMPLANT
GLOVE EXAM NITRILE XL STR (GLOVE) IMPLANT
GOWN STRL REUS W/ TWL LRG LVL3 (GOWN DISPOSABLE) ×1 IMPLANT
GOWN STRL REUS W/ TWL XL LVL3 (GOWN DISPOSABLE) ×1 IMPLANT
GOWN STRL REUS W/TWL 2XL LVL3 (GOWN DISPOSABLE) IMPLANT
GOWN STRL REUS W/TWL LRG LVL3 (GOWN DISPOSABLE) ×1
GOWN STRL REUS W/TWL XL LVL3 (GOWN DISPOSABLE) ×1
HEMOSTAT POWDER KIT SURGIFOAM (HEMOSTASIS) ×1 IMPLANT
KIT BASIN OR (CUSTOM PROCEDURE TRAY) ×1 IMPLANT
KIT TURNOVER KIT B (KITS) ×1 IMPLANT
NDL HYPO 22X1.5 SAFETY MO (MISCELLANEOUS) ×1 IMPLANT
NEEDLE HYPO 22X1.5 SAFETY MO (MISCELLANEOUS) ×1 IMPLANT
NS IRRIG 1000ML POUR BTL (IV SOLUTION) ×1 IMPLANT
PACK LAMINECTOMY NEURO (CUSTOM PROCEDURE TRAY) ×1 IMPLANT
PAD ARMBOARD 7.5X6 YLW CONV (MISCELLANEOUS) ×3 IMPLANT
PATTIES SURGICAL .5 X1 (DISPOSABLE) IMPLANT
SOL ELECTROSURG ANTI STICK (MISCELLANEOUS)
SOLUTION ELECTROSURG ANTI STCK (MISCELLANEOUS) ×1 IMPLANT
SOLUTION IRRIG SURGIPHOR (IV SOLUTION) IMPLANT
SPONGE SURGIFOAM ABS GEL SZ50 (HEMOSTASIS) ×1 IMPLANT
STRIP CLOSURE SKIN 1/2X4 (GAUZE/BANDAGES/DRESSINGS) ×1 IMPLANT
SUT VIC AB 1 CT1 18XBRD ANBCTR (SUTURE) ×2 IMPLANT
SUT VIC AB 1 CT1 8-18 (SUTURE) ×1
SUT VIC AB 2-0 CP2 18 (SUTURE) ×2 IMPLANT
TOWEL GREEN STERILE (TOWEL DISPOSABLE) ×1 IMPLANT
TOWEL GREEN STERILE FF (TOWEL DISPOSABLE) ×1 IMPLANT
WATER STERILE IRR 1000ML POUR (IV SOLUTION) ×1 IMPLANT

## 2023-01-20 NOTE — Progress Notes (Signed)
Subjective: The patient is alert and pleasant.  Objective: Vital signs in last 24 hours: Temp:  [98 F (36.7 C)-98.7 F (37.1 C)] 98 F (36.7 C) (06/24 1100) Pulse Rate:  [86-93] 87 (06/24 1100) Resp:  [14-22] 15 (06/24 1100) BP: (110-129)/(59-75) 115/60 (06/24 1100) SpO2:  [94 %-97 %] 94 % (06/24 1100) Estimated body mass index is 27.3 kg/m as calculated from the following:   Height as of this encounter: 6' (1.829 m).   Weight as of this encounter: 91.3 kg.   Intake/Output from previous day: 06/23 0701 - 06/24 0700 In: 240 [P.O.:240] Out: 1500 [Urine:1500] Intake/Output this shift: Total I/O In: 441.6 [I.V.:441.6] Out: 450 [Urine:450]  Physical exam the patient is alert and oriented.  His lower extremity strength is grossly normal.  Lab Results: Recent Labs    01/19/23 0512 01/20/23 0744  WBC 9.5 10.9*  HGB 7.6* 7.9*  HCT 25.8* 26.0*  PLT 217 223   BMET Recent Labs    01/18/23 0525 01/19/23 0512  NA 138 136  K 3.5 3.8  CL 101 100  CO2 26 26  GLUCOSE 138* 145*  BUN 16 15  CREATININE 1.15 1.07  CALCIUM 8.7* 8.5*    Studies/Results: IR IVC FILTER PLMT / S&I Lenise Arena GUID/MOD SED  Result Date: 01/19/2023 INDICATION: 72 year old male on chronic anticoagulation who is currently hospitalized with an epidural abscess. His anticoagulation was stopped in order for him to undergo surgical decompression, however DVT ultrasound demonstrates new acute DVT in the calf veins. Therefore, patient requires placement of a retrievable IVC filter for PE prophylaxis in the perioperative timeframe. EXAM: ULTRASOUND GUIDANCE FOR VASCULARACCESS IVC CATHETERIZATION AND VENOGRAM IVC FILTER INSERTION Interventional Radiologist:  Sterling Big, MD MEDICATIONS: None. ANESTHESIA/SEDATION: 0.5 mg Dilaudid administered for pain control FLUOROSCOPY TIME:  Radiation exposure index: 37 mGy reference air kerma COMPLICATIONS: None immediate. PROCEDURE: Informed written consent was obtained from  the patient after a thorough discussion of the procedural risks, benefits and alternatives. All questions were addressed. Maximal Sterile Barrier Technique was utilized including caps, mask, sterile gowns, sterile gloves, sterile drape, hand hygiene and skin antiseptic. A timeout was performed prior to the initiation of the procedure. Maximal barrier sterile technique utilized including caps, mask, sterile gowns, sterile gloves, large sterile drape, hand hygiene, and Betadine prep. Under sterile condition and local anesthesia, right internal jugular venous access was performed with ultrasound. An ultrasound image was saved and sent to PACS. Over a guidewire, the IVC filter delivery sheath and inner dilator were advanced into the IVC just above the IVC bifurcation. Contrast injection was performed for an IVC venogram. Through the delivery sheath, a retrievable Bard Denali IVC filter was deployed below the level of the renal veins and above the IVC bifurcation. Limited post deployment venacavagram was performed. The delivery sheath was removed and hemostasis was obtained with manual compression. A dressing was placed. The patient tolerated the procedure well without immediate post procedural complication. FINDINGS: The IVC is patent. No evidence of thrombus, stenosis, or occlusion. No variant venous anatomy. Successful placement of the IVC filter below the level of the renal veins. IMPRESSION: Successful ultrasound and fluoroscopically guided placement of an infrarenal retrievable IVC filter via right jugular approach. PLAN: This IVC filter is potentially retrievable. The patient will be assessed for filter retrieval by Interventional Radiology in approximately 8-12 weeks. Further recommendations regarding filter retrieval, continued surveillance or declaration of device permanence, will be made at that time. Electronically Signed   By: Isac Caddy.D.  On: 01/19/2023 11:37    Assessment/Plan: Vertebral  osteonecrosis, lumbar discitis, epidural abscess, spinal stenosis: I have again discussed the situation with the patient.  We discussed the various treatment options including surgery.  I have described the surgical option of L3-4, L4-5 and L5-S1 laminotomy/foraminotomies.  I have answered all his questions regarding surgery.  He wants to proceed with the operation.  DVT: His heparin has been stopped today.  He has an IVC filter in place.  LOS: 27 days     Cristi Loron 01/20/2023, 4:46 PM

## 2023-01-20 NOTE — Progress Notes (Signed)
This chaplain is present with the Pt., son-Eric, and daughter-in law-Chelsea for prayer before the Pt. surgery. The Pt. is resting well with an articulated confidence in his faith and the medical team.  The chaplain is appreciative of RN-Chat assistance in answering the family's questions. The chaplain understands Minerva Areola and Leeroy Bock will remain at the hospital during the surgery. Contact information was updated with the RN.  This chaplain will continue to follow the Pt.  Chaplain Stephanie Acre [phone]

## 2023-01-20 NOTE — Progress Notes (Signed)
Transported to the OR by bed awake and alert., [id number]. Family in the waiting room.

## 2023-01-20 NOTE — Progress Notes (Signed)
PT Cancellation Note  Patient Details Name: KUTLER VANVRANKEN MRN: [id number] DOB: 1951/07/08   Cancelled Treatment:    Reason Eval/Treat Not Completed: (P) Patient at procedure or test/unavailable Pt is off floor for laminectomy. PT will follow back tomorrow.  Gracynn Rajewski B. Beverely Risen PT, DPT Acute Rehabilitation Services Please use secure chat or  Call Office [phone]    Elon Alas [facility] 01/20/2023, 5:38 PM

## 2023-01-20 NOTE — Transfer of Care (Signed)
Immediate Anesthesia Transfer of Care Note  Patient: Jim Ramirez  Procedure(s) Performed: LUMBAR LAM FOR EPIDURAL ABSCESS, LUMBAR THREE-FOUR, LUMBAR FOUR-FIVE,  LUMBAR FIVE-SACRAL ONE (Back)  Patient Location: PACU  Anesthesia Type:General  Level of Consciousness: awake, oriented, and sedated  Airway & Oxygen Therapy: Patient Spontanous Breathing  Post-op Assessment: Report given to RN and Post -op Vital signs reviewed and stable  Post vital signs: Reviewed and stable  Last Vitals:  Vitals Value Taken Time  BP 132/74   Temp    Pulse 86   Resp 14   SpO2 92     Last Pain:  Vitals:   01/20/23 1200  TempSrc:   PainSc: 0-No pain      Patients Stated Pain Goal: 0 (01/19/23 0920)  Complications: No notable events documented.

## 2023-01-20 NOTE — Progress Notes (Signed)
PROGRESS NOTE    OLANREWAJU Jim Ramirez  EXN:[id number] DOB: June 05, 1951 DOA: 12/24/2022 PCP: [name], MD   Brief Narrative: 72 y.o. male with a history of hypertension and diabetes initially presented to [facility] on 12/22/2022 for chronic back pain radiating both her legs, with some shortness of breath that started 1 week prior to admission CT scan of the abdomen pelvis or lumbar spine discitis and osteomyelitis, transferred to [facility] found to be bacteremic with staph aureus with necrosis of the lumbar spine, also in A-fib with RVR, neurology, ID and cardiology were consulted. MRI of the wrist was done that showed fluid collection and surgery was consulted who recommended conservative management. Despite IV antibiotics he continues to spike fevers.  MRI lumbar spine done on 6/7 showed worsening of infection/concerns of epidural abscess but neurosurgery recommending against any surgical intervention. Initial blood cultures at [facility] grew MSSA. TEE from 6/4 does not show any vegetation but MRI does show small fluid collection, CT surgery recommended conservative management.  Infectious disease recommended IV nafcillin from negative cultures on 5/28 until 01/10/2023, now switched to cefazolin 2 g IV every 8 hourly for additional 6 weeks, thereafter cefadroxil 1000 mg twice daily (starting 7/24) for at least 10 months.    Due to progressive low back pain radiating down legs, MRI repeated 6/7 showing persistent L4, L5 osteonecrosis, probable L4/5 discitis with right L4-5 septic facet, multiple psoas intramuscular loculated fluid collections. Despite continued IV antibiotics, a repeat MRI 6/18 showing new epidural abscess and stenosis. Neurosurgery discussed risks and benefits of surgery at length with the patient who opts to proceed with surgery after eliquis washout on 6/24.   01/19/2023: Patient seen.  No new complaints.  Patient has had IVC filter placed by interventional radiology team.  Possible  surgery tomorrow by the neurosurgery team.  01/20/2023: Patient seen.  No new complaints.  For surgery later today (not certain if the surgical team will transfer patient to higher level of care after surgery).  Assessment & Plan:   Principal Problem:   Discitis of lumbar region Active Problems:   Diabetes mellitus type 2 in nonobese (HCC)   Dyslipidemia   Essential hypertension   New onset atrial fibrillation (HCC)   Sepsis (HCC)   BPH (benign prostatic hyperplasia)   Mood disorder (HCC)   MSSA bacteremia   Malnutrition of moderate degree   Severe sepsis secondary to MSSA bacteremia, persistent vertebral osteonecrosis/infarct of L4-L-L5 with discitis/septic arthritis and multiple loculated fluid collection around psoas muscle: He has continued hyperintensity of his L4 and L5 vertebral bodies. There is septic arthritis in his right L4-5 facet. 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 There is spinal stenosis most prominent at L4-5.  - ID has placed antibiotic orders, continuing ancef currently, transition to cefadroxil 7/24.   - Neurosurgery, Dr. Lovell Sheehan, planning surgery on 6/24 after eliquis washout. Discussion of risks/potential benefits of surgery have been discussed between surgeon and patient/HCPOA per neurosurgery NP. 01/19/2023: Patient seen.  Patient remains stable.  For possible surgery tomorrow. 01/20/2023: For surgery today.   Left wrist edema/concern for tenosynovitis: - MRI shows persistent and worsening abscess of the right wrist, [facility] was consulted, status post bedside aspiration 6/10 but not enough sample.  Grossly did not appear purulent per their documentation. Planning to continue antibiotics.   Acute kidney injury: Resolved.   Osteonecrosis of L4-L5: - Analgesia.    New onset paroxysmal atrial fibrillation: - Continue amiodarone, now down to 200mg   daily, remains in NSR. - Holding eliquis in preparation for neurosurgery -Hold  heparin prior to surgery.  ron deficiency anemia:  - Continue iron supplements with bowel regimen   Hypokalemia: -Resolved.  Thrombocytopenia:  -Likely due to sepsis. -Resolved     Elevated LFTs and hyperbilirubinemia: - Recheck 6/23 -01/20/2023:  Resolved.  Bilirubin is 0.4 (total).   Elevate troponin: -Likely demand ischemia.   -No chest pain.     Constipation: Abd is benign, though opioids are being given.  - Schedule senokot instead of prn only.   BPH: -Continue Flomax.   Diabetes mellitus type 2 with hyperglycemia: -A1c of 8.1% - Continue glargine 15u qHS and SSI, at inpatient goal.   Obstructive sleep apnea: -Continue CPAP at night.   Hyperlipidemia:  - Continue statin   Mood disorder: - Continue lexapro daily, risperdal qHS   Moderate protein caloric malnutrition: - Supplement protein.  LLE ACUTE DVT 6/22 ON lovenox (was on eliquis on hold for surgery 6/24) Consulted IR For ivc filter   Goals of care: - Patient has already been seen by palliative care.  Currently DNR but continue full treatment. Will continue ongoing discussions by palliative regarding goals of care. Overall very poor prognosis, though the only chance at an acceptable quality of life to the patient would require neurosurgery which is planned.    Pressure Injury 01/03/23 Buttocks Right;Left Stage 2 -  Partial thickness loss of dermis presenting as a shallow open injury with a red, pink wound bed without slough. 1x1 (Active)  01/03/23 2100  Location: Buttocks  Location Orientation: Right;Left  Staging: Stage 2 -  Partial thickness loss of dermis presenting as a shallow open injury with a red, pink wound bed without slough.  Wound Description (Comments): 1x1  Present on Admission: No  Dressing Type Foam - Lift dressing to assess site every shift 01/20/23 0800     Pressure Injury 01/18/23 Heel Left Stage 1 -  Intact skin with non-blanchable redness of a localized area usually over a bony  prominence. (Active)  01/18/23 1337  Location: Heel  Location Orientation: Left  Staging: Stage 1 -  Intact skin with non-blanchable redness of a localized area usually over a bony prominence.  Wound Description (Comments):   Present on Admission:   Dressing Type Foam - Lift dressing to assess site every shift 01/20/23 0800     Pressure Injury 01/19/23 Heel Right Stage 1 -  Intact skin with non-blanchable redness of a localized area usually over a bony prominence. (Active)  01/19/23 1007  Location: Heel  Location Orientation: Right  Staging: Stage 1 -  Intact skin with non-blanchable redness of a localized area usually over a bony prominence.  Wound Description (Comments):   Present on Admission:   Dressing Type Foam - Lift dressing to assess site every shift 01/20/23 0800      Nutrition Problem: Moderate Malnutrition Etiology: acute illness     Signs/Symptoms: energy intake < 75% for > 7 days, mild muscle depletion, mild fat depletion    Interventions: Refer to RD note for recommendations  Estimated body mass index is 27.3 kg/m as calculated from the following:   Height as of this encounter: 6' (1.829 m).   Weight as of this encounter: 91.3 kg.  DVT prophylaxis:lovenox Code Status:DNR Family Communication:NONE Disposition Plan:  Status is: Inpatient Remains inpatient appropriate because: SPINAL ABSCESS   Consultants:  ID NEURO SURGERY Antimicrobials: Anti-infectives (From admission, onward)    Start     Dose/Rate Route Frequency  Ordered Stop   02/19/23 1000  cefadroxil (DURICEF) capsule 1,000 mg        1,000 mg Oral 2 times daily 01/13/23 1117 11/26/23 0959   01/11/23 0600  ceFAZolin (ANCEF) IVPB 2g/100 mL premix        2 g 200 mL/hr over 30 Minutes Intravenous Every 8 hours 12/31/22 1449 02/18/23 2359   12/28/22 1015  nafcillin 12 g in sodium chloride 0.9 % 500 mL continuous infusion        12 g 20.8 mL/hr over 24 Hours Intravenous Every 24 hours 12/28/22  0925 01/11/23 1245   12/28/22 0830  nafcillin 2 g in sodium chloride 0.9 % 100 mL IVPB  Status:  Discontinued        2 g 216 mL/hr over 30 Minutes Intravenous Every 4 hours 12/28/22 0736 12/28/22 0925   12/26/22 1400  ceFAZolin (ANCEF) IVPB 2g/100 mL premix  Status:  Discontinued        2 g 200 mL/hr over 30 Minutes Intravenous Every 8 hours 12/26/22 0823 12/28/22 0736   12/25/22 2200  vancomycin (VANCOREADY) IVPB 1750 mg/350 mL  Status:  Discontinued        1,750 mg 175 mL/hr over 120 Minutes Intravenous Every 24 hours 12/25/22 0919 12/26/22 0823   12/25/22 1200  cefTRIAXone (ROCEPHIN) 2 g in sodium chloride 0.9 % 100 mL IVPB  Status:  Discontinued        2 g 200 mL/hr over 30 Minutes Intravenous Every 24 hours 12/25/22 0915 12/25/22 0935   12/24/22 2000  ceFEPIme (MAXIPIME) 2 g in sodium chloride 0.9 % 100 mL IVPB  Status:  Discontinued        2 g 200 mL/hr over 30 Minutes Intravenous Every 8 hours 12/24/22 1856 12/25/22 0837   12/24/22 2000  vancomycin (VANCOREADY) IVPB 1750 mg/350 mL  Status:  Discontinued        1,750 mg 175 mL/hr over 120 Minutes Intravenous Every 24 hours 12/24/22 1857 12/25/22 0837   12/24/22 1930  metroNIDAZOLE (FLAGYL) IVPB 500 mg  Status:  Discontinued        500 mg 100 mL/hr over 60 Minutes Intravenous Every 12 hours 12/24/22 1830 12/25/22 0837        Subjective: No new complaints.  Objective: Vitals:   01/20/23 0300 01/20/23 0700 01/20/23 0729 01/20/23 0800  BP:  110/66  121/75  Pulse: 93 92 93 90  Resp:  17 16 (!) 22  Temp: 98.7 F (37.1 C)  98.3 F (36.8 C)   TempSrc: Oral  Oral   SpO2: 96%  97% 95%  Weight:      Height:        Intake/Output Summary (Last 24 hours) at 01/20/2023 1016 Last data filed at 01/20/2023 0731 Gross per 24 hour  Intake 120 ml  Output 1500 ml  Net -1380 ml    Filed Weights   01/05/23 0346 01/14/23 1241 01/19/23 0800  Weight: 86.5 kg 91.3 kg 91.3 kg    Examination: General exam: Not in any distress.   Patient is awake and alert.  Patient is pale.  No jaundice.   Respiratory system: Clear to auscultation. Respiratory effort normal. Cardiovascular system: S1 & S2 heard. Gastrointestinal system: Abdomen is soft and nontender.   Central nervous system: Alert and oriented.  Extremities: Swelling of left lower extremity.   Data Reviewed: I have personally reviewed following labs and imaging studies  CBC: Recent Labs  Lab 01/16/23 0514 01/18/23 0525 01/19/23 0512 01/20/23 0744  WBC 9.4 9.7 9.5 10.9*  HGB 8.2* 7.7* 7.6* 7.9*  HCT 27.4* 26.1* 25.8* 26.0*  MCV 84.3 85.9 86.3 87.0  PLT 155 205 217 223    Basic Metabolic Panel: Recent Labs  Lab 01/16/23 0514 01/18/23 0525 01/19/23 0512  NA 138 138 136  K 3.5 3.5 3.8  CL 104 101 100  CO2 27 26 26   GLUCOSE 143* 138* 145*  BUN 18 16 15   CREATININE 1.29* 1.15 1.07  CALCIUM 8.4* 8.7* 8.5*    GFR: Estimated Creatinine Clearance: 70.5 mL/min (by C-G formula based on SCr of 1.07 mg/dL). Liver Function Tests: Recent Labs  Lab 01/18/23 0525 01/19/23 0512  AST 13* 12*  ALT 6 <5  ALKPHOS 61 64  BILITOT 0.4 0.4  PROT 6.2* 6.2*  ALBUMIN <1.5* <1.5*    No results for input(s): "LIPASE", "AMYLASE" in the last 168 hours. Recent Labs  Lab 01/19/23 0512  AMMONIA 22    Coagulation Profile: No results for input(s): "INR", "PROTIME" in the last 168 hours. Cardiac Enzymes: No results for input(s): "CKTOTAL", "CKMB", "CKMBINDEX", "TROPONINI" in the last 168 hours. BNP (last 3 results) No results for input(s): "PROBNP" in the last 8760 hours. HbA1C: No results for input(s): "HGBA1C" in the last 72 hours. CBG: Recent Labs  Lab 01/19/23 0604 01/19/23 1156 01/19/23 1518 01/19/23 2103 01/20/23 0605  GLUCAP 148* 165* 128* 292* 164*    Lipid Profile: No results for input(s): "CHOL", "HDL", "LDLCALC", "TRIG", "CHOLHDL", "LDLDIRECT" in the last 72 hours. Thyroid Function Tests: No results for input(s): "TSH", "T4TOTAL",  "FREET4", "T3FREE", "THYROIDAB" in the last 72 hours. Anemia Panel: No results for input(s): "VITAMINB12", "FOLATE", "FERRITIN", "TIBC", "IRON", "RETICCTPCT" in the last 72 hours. Sepsis Labs: No results for input(s): "PROCALCITON", "LATICACIDVEN" in the last 168 hours.  Recent Results (from the past 240 hour(s))  Surgical pcr screen     Status: None   Collection Time: 01/20/23  7:19 AM   Specimen: Nasal Mucosa; Nasal Swab  Result Value Ref Range Status   MRSA, PCR NEGATIVE NEGATIVE Final   Staphylococcus aureus NEGATIVE NEGATIVE Final    Comment: (NOTE) The Xpert SA Assay (FDA approved for NASAL specimens in patients 80 years of age and older), is one component of a comprehensive surveillance program. It is not intended to diagnose infection nor to guide or monitor treatment. Performed at [facility], [address] [place], [place] [address]          Radiology Studies: IR IVC FILTER PLMT / S&I Lenise Arena GUID/MOD SED  Result Date: 01/19/2023 INDICATION: 72 year old male on chronic anticoagulation who is currently hospitalized with an epidural abscess. His anticoagulation was stopped in order for him to undergo surgical decompression, however DVT ultrasound demonstrates new acute DVT in the calf veins. Therefore, patient requires placement of a retrievable IVC filter for PE prophylaxis in the perioperative timeframe. EXAM: ULTRASOUND GUIDANCE FOR VASCULARACCESS IVC CATHETERIZATION AND VENOGRAM IVC FILTER INSERTION Interventional Radiologist:  Sterling Big, MD MEDICATIONS: None. ANESTHESIA/SEDATION: 0.5 mg Dilaudid administered for pain control FLUOROSCOPY TIME:  Radiation exposure index: 37 mGy reference air kerma COMPLICATIONS: None immediate. PROCEDURE: Informed written consent was obtained from the patient after a thorough discussion of the procedural risks, benefits and alternatives. All questions were addressed. Maximal Sterile Barrier Technique was utilized including  caps, mask, sterile gowns, sterile gloves, sterile drape, hand hygiene and skin antiseptic. A timeout was performed prior to the initiation of the procedure. Maximal barrier sterile technique utilized including caps, mask,  sterile gowns, sterile gloves, large sterile drape, hand hygiene, and Betadine prep. Under sterile condition and local anesthesia, right internal jugular venous access was performed with ultrasound. An ultrasound image was saved and sent to PACS. Over a guidewire, the IVC filter delivery sheath and inner dilator were advanced into the IVC just above the IVC bifurcation. Contrast injection was performed for an IVC venogram. Through the delivery sheath, a retrievable Bard Denali IVC filter was deployed below the level of the renal veins and above the IVC bifurcation. Limited post deployment venacavagram was performed. The delivery sheath was removed and hemostasis was obtained with manual compression. A dressing was placed. The patient tolerated the procedure well without immediate post procedural complication. FINDINGS: The IVC is patent. No evidence of thrombus, stenosis, or occlusion. No variant venous anatomy. Successful placement of the IVC filter below the level of the renal veins. IMPRESSION: Successful ultrasound and fluoroscopically guided placement of an infrarenal retrievable IVC filter via right jugular approach. PLAN: This IVC filter is potentially retrievable. The patient will be assessed for filter retrieval by Interventional Radiology in approximately 8-12 weeks. Further recommendations regarding filter retrieval, continued surveillance or declaration of device permanence, will be made at that time. Electronically Signed   By: Malachy Moan M.D.   On: 01/19/2023 11:37   VAS [place] LOWER EXTREMITY VENOUS (DVT)  Result Date: 01/18/2023  Lower Venous DVT Study Patient Name:  Jim Ramirez  Date of Exam:   01/18/2023 Medical Rec #: [id number]         Accession #:    [id number] Date  of Birth: [birth year]         Patient Gender: M Patient Age:   72 years Exam Location:  [facility] Procedure:      VAS [place] LOWER EXTREMITY VENOUS (DVT) Referring Phys: Huntley Dec TOMLINSON --------------------------------------------------------------------------------  Indications: Edema.  Risk Factors: DVT 2022 RLE (peroneal vein). Comparison Study: Previous exam on 12/28/22 was negative for DVT. Performing Technologist: Ernestene Mention RVT, RDMS  Examination Guidelines: A complete evaluation includes B-mode imaging, spectral Doppler, color Doppler, and power Doppler as needed of all accessible portions of each vessel. Bilateral testing is considered an integral part of a complete examination. Limited examinations for reoccurring indications may be performed as noted. The reflux portion of the exam is performed with the patient in reverse Trendelenburg.  +---------+---------------+---------+-----------+----------+--------------+ RIGHT    CompressibilityPhasicitySpontaneityPropertiesThrombus Aging +---------+---------------+---------+-----------+----------+--------------+ CFV      Full           Yes      No                                  +---------+---------------+---------+-----------+----------+--------------+ SFJ      Full                                                        +---------+---------------+---------+-----------+----------+--------------+ FV Prox  Full           Yes      Yes                                 +---------+---------------+---------+-----------+----------+--------------+ FV Mid   Full  Yes      Yes                                 +---------+---------------+---------+-----------+----------+--------------+ FV DistalFull           Yes      Yes                                 +---------+---------------+---------+-----------+----------+--------------+ PFV      Full                                                         +---------+---------------+---------+-----------+----------+--------------+ POP      Full           Yes      Yes                                 +---------+---------------+---------+-----------+----------+--------------+ PTV      Full                                                        +---------+---------------+---------+-----------+----------+--------------+ PERO     Full                                                        +---------+---------------+---------+-----------+----------+--------------+   +---------+---------------+---------+-----------+----------+------------------+ LEFT     CompressibilityPhasicitySpontaneityPropertiesThrombus Aging     +---------+---------------+---------+-----------+----------+------------------+ CFV      Full           Yes      Yes                                     +---------+---------------+---------+-----------+----------+------------------+ SFJ      Full                                                            +---------+---------------+---------+-----------+----------+------------------+ FV Prox  Full           Yes      Yes                                     +---------+---------------+---------+-----------+----------+------------------+ FV Mid   Full           Yes      Yes                                     +---------+---------------+---------+-----------+----------+------------------+ FV DistalFull  Yes      Yes                                     +---------+---------------+---------+-----------+----------+------------------+ PFV      Full                                                            +---------+---------------+---------+-----------+----------+------------------+ POP      Full           Yes      Yes                                     +---------+---------------+---------+-----------+----------+------------------+ PTV      None           No       No                    Acute              +---------+---------------+---------+-----------+----------+------------------+ PERO     Full                                                            +---------+---------------+---------+-----------+----------+------------------+ Gastroc  None           No       No                   acute - one of                                                           paired             +---------+---------------+---------+-----------+----------+------------------+     Summary: BILATERAL: -No evidence of popliteal cyst, bilaterally. -Subcutaneous edema, bilaterally. RIGHT: - There is no evidence of deep vein thrombosis in the lower extremity.  LEFT: - Findings consistent with acute deep vein thrombosis involving the left gastrocnemius veins, and left posterior tibial veins.  *See table(s) above for measurements and observations. Electronically signed by Waverly Ferrari MD on 01/18/2023 at 7:14:07 PM.    Final         Scheduled Meds:  acetaminophen  500 mg Oral TID   amiodarone  200 mg Oral Daily   azelastine  1 spray Each Nare BID   [START ON 02/19/2023] cefadroxil  1,000 mg Oral BID   Chlorhexidine Gluconate Cloth  6 each Topical Daily   diclofenac Sodium  2 g Topical QID   escitalopram  10 mg Oral Daily   ferrous sulfate  325 mg Oral Q breakfast   fluticasone  2 spray Each Nare Daily   gabapentin  100 mg Oral BID   insulin aspart  0-15 Units Subcutaneous TID WC   insulin aspart  0-5 Units  Subcutaneous QHS   insulin glargine-yfgn  15 Units Subcutaneous QHS   ketotifen  1 drop Left Eye BID   lidocaine  2 patch Transdermal Q24H   loratadine  10 mg Oral Daily   LORazepam  0.5 mg Oral Once   melatonin  3 mg Oral QHS   methocarbamol  500 mg Oral BID   multivitamin with minerals  1 tablet Oral Daily   pantoprazole  40 mg Oral BID   risperiDONE  0.5 mg Oral QHS   rosuvastatin  20 mg Oral Daily   senna  2 tablet Oral Daily   sodium chloride flush  10-40 mL  Intracatheter Q12H   sodium chloride flush  3 mL Intravenous Q12H   tamsulosin  0.4 mg Oral Daily   Continuous Infusions:   ceFAZolin (ANCEF) IV 2 g (01/20/23 0550)   heparin 1,900 Units/hr (01/20/23 0748)   lactated ringers       LOS: 27 days    Time spent: 35 min  Barnetta Chapel, MD 01/20/2023, 10:16 AM

## 2023-01-20 NOTE — Anesthesia Postprocedure Evaluation (Signed)
Anesthesia Post Note  Patient: WINSTON MISNER  Procedure(s) Performed: LUMBAR LAM FOR EPIDURAL ABSCESS, LUMBAR THREE-FOUR, LUMBAR FOUR-FIVE,  LUMBAR FIVE-SACRAL ONE (Back)     Patient location during evaluation: PACU Anesthesia Type: General Level of consciousness: awake and alert, patient cooperative and oriented Pain management: pain level controlled Vital Signs Assessment: post-procedure vital signs reviewed and stable Respiratory status: spontaneous breathing, nonlabored ventilation, respiratory function stable and patient connected to nasal cannula oxygen Cardiovascular status: blood pressure returned to baseline and stable Postop Assessment: no apparent nausea or vomiting Anesthetic complications: no   No notable events documented.  Last Vitals:  Vitals:   01/20/23 2030 01/20/23 2045  BP: 96/85 130/67  Pulse: 86 87  Resp: 17 16  Temp:    SpO2: 93% 94%    Last Pain:  Vitals:   01/20/23 2045  TempSrc:   PainSc: Asleep    LLE Motor Response: Purposeful movement (01/20/23 2045) LLE Sensation: Full sensation (01/20/23 2045) RLE Motor Response: Purposeful movement (01/20/23 2045) RLE Sensation: Full sensation (01/20/23 2045)      Erling Cruz. Damione Robideau

## 2023-01-20 NOTE — Op Note (Signed)
Brief history: The patient is 72 year old white male who presented with bacteremia and was found to have lumbar discitis, osteomyelitis and epidural abscess.  He was treated with antibiotics at the direction of ID.  He did not improve.  Follow-up MRI scans demonstrated worsening of his infection.  I discussed the various treatment options with him.  He has decided to proceed with surgery.  Preop diagnosis: Lumbar epidural abscess, lumbar discitis, lumbar osteomyelitis, lumbar spinal stenosis lumbar septic arthritis  Postop diagnosis: The same  Procedure: Left L3-4, L4-5 and L5-S1 laminotomy/foraminotomies for drainage of epidural abscess and decompression of the thecal sac using microdissection.  Surgeon: Dr. Delma Officer  Assistant: Hildred Priest, NP  Anesthesia: General tracheal  Estimated blood loss: 75 cc  Specimens: Epidural cultures  Drains: None  Complications: None  Description of procedure: The patient was brought to the operating room by the anesthesia team.  General endotracheal anesthesia was induced.  The patient was turned to the prone position on the Andrew table.  His lumbosacral region was then prepared with Betadine scrub and Betadine solution.  Sterile drapes were applied.  I injected the area to be incised with Marcaine with epinephrine solution.  I scalpel to make a linear midline incision over the L3-4, L4-5 and L5-S1 interspace.  I used the wheat Hydrographic surveyor for exposure.  I then used electrocautery to perform a left sided subperiosteal dissection exposing the left spinous process and lamina at L3-4, L4-5 and L5-S1.  I obtained an intraoperative radiograph to confirm the location.  I inserted the McCullough retractor for exposure.  We then brought the operative microscope into the operative field.  Under its magnification and illumination I completed the microdissection/decompression.  I used a high-speed drill to perform a left L3-4, L4-5 and L5-S1 laminotomy.   I widen the laminotomy with a Kerrison punch removing the ligament ligamentum flavum.  This exposed the thecal sac.  I performed a foraminotomy about the left L4, L5 and S1 nerve root.  We encountered purulent material/pus and obtain cultures at L4-5 and L5-S1.  We drained a moderate amount of pus from the ventral epidural space..  He had quite a bit of ventral epidural inflammatory tissue which I dissected from the thecal sac and removed with the pituitary forceps decompressing the thecal sac.  We then obtained hemostasis with bipolar cautery and Gelfoam.  I then irrigated the wound out with bacitracin solution.  I removed the retractor.  I then reapproximated patient's lumbar fascia with interrupted 0 Vicryl suture.  I reapproximated the subcutaneous tissue with interrupted 2-0 Vicryl suture.  I reapproximated the skin with Steri-Strips and benzoin.  The wound was then coated with bacitracin ointment.  A sterile dressing was applied.  The drapes were removed.  By report all sponge, instrument, and needle counts were correct at the end of this case.

## 2023-01-20 NOTE — Anesthesia Procedure Notes (Signed)
Arterial Line Insertion Start/End6/24/2024 5:58 PM, 01/20/2023 6:01 PM Performed by: Shelton Silvas, MD, anesthesiologist  Patient location: Pre-op. Preanesthetic checklist: patient identified, IV checked, site marked, risks and benefits discussed, surgical consent, monitors and equipment checked, pre-op evaluation, timeout performed and anesthesia consent Lidocaine 1% used for infiltration Right, radial was placed Catheter size: 20 G Hand hygiene performed  and maximum sterile barriers used   Attempts: 1 Procedure performed without using ultrasound guided technique. Following insertion, dressing applied and Biopatch. Post procedure assessment: normal and unchanged  Post procedure complications: second provider assisted. Patient tolerated the procedure well with no immediate complications.

## 2023-01-20 NOTE — Anesthesia Preprocedure Evaluation (Addendum)
Anesthesia Evaluation  Patient identified by MRN, date of birth, ID band Patient awake    Reviewed: Allergy & Precautions, NPO status , Patient's Chart, lab work & pertinent test results, reviewed documented beta blocker date and time   Airway Mallampati: II  TM Distance: >3 FB Neck ROM: Full    Dental  (+) Edentulous Upper, Edentulous Lower   Pulmonary sleep apnea , former smoker    + decreased breath sounds      Cardiovascular hypertension, Pt. on home beta blockers  Rhythm:Regular Rate:Normal     Neuro/Psych  PSYCHIATRIC DISORDERS  Depression     Neuromuscular disease    GI/Hepatic Neg liver ROS, PUD,GERD  Medicated,,  Endo/Other  diabetes, Type 2, Insulin Dependent, Oral Hypoglycemic Agents    Renal/GU Renal disease     Musculoskeletal  (+) Arthritis ,    Abdominal   Peds  Hematology negative hematology ROS (+)   Anesthesia Other Findings   Reproductive/Obstetrics                             Anesthesia Physical Anesthesia Plan  ASA: 3  Anesthesia Plan: General   Post-op Pain Management: Tylenol PO (pre-op)* and Gabapentin PO (pre-op)*   Induction: Intravenous  PONV Risk Score and Plan: 3 and Ondansetron and Treatment may vary due to age or medical condition  Airway Management Planned: Oral ETT  Additional Equipment: Arterial line  Intra-op Plan:   Post-operative Plan: Extubation in OR  Informed Consent: I have reviewed the patients History and Physical, chart, labs and discussed the procedure including the risks, benefits and alternatives for the proposed anesthesia with the patient or authorized representative who has indicated his/her understanding and acceptance.   Patient has DNR.  Discussed DNR with patient and Suspend DNR.     Plan Discussed with: CRNA  Anesthesia Plan Comments: (Arterial line and Type and screen in room.   Breathing tx in room.   Lab  Results      Component                Value               Date                      WBC                      10.9 (H)            01/20/2023                HGB                      7.9 (L)             01/20/2023                HCT                      26.0 (L)            01/20/2023                MCV                      87.0                01/20/2023  PLT                      223                 01/20/2023           )       Anesthesia Quick Evaluation

## 2023-01-20 NOTE — Progress Notes (Signed)
ANTICOAGULATION CONSULT NOTE - Follow Up Consult  Pharmacy Consult for heparin Indication:  DVT/Afib  Labs: Recent Labs    01/18/23 0525 01/19/23 0512 01/19/23 0642 01/19/23 2316  HGB 7.7* 7.6*  --   --   HCT 26.1* 25.8*  --   --   PLT 205 217  --   --   APTT  --   --  48* 43*  HEPARINUNFRC  --   --  0.28*  --   CREATININE 1.15 1.07  --   --     Assessment: 72yo male subtherapeutic on heparin after rate chang; no infusion issues or signs of bleeding per RN.  Goal of Therapy:  aPTT 66-102 seconds   Plan:  Increase heparin infusion by 4 units/kg/hr to 1900 units/hr. Check level in 6 hours.   Vernard Gambles, PharmD, BCPS 01/20/2023 12:51 AM

## 2023-01-20 NOTE — Progress Notes (Signed)
Nutrition Follow-up  DOCUMENTATION CODES:   Non-severe (moderate) malnutrition in context of acute illness/injury  INTERVENTION:  Once returns from OR, recommend continuing: Regular diet for widest variety of menu options Magic cup TID with meals, each supplement provides 290 kcal and 9 grams of protein Mighty Shake TID with meals, each supplement provides 330 kcals and 9 grams of protein MVI with minerals daily Snacks TID to augment PO intake  NUTRITION DIAGNOSIS:   Moderate Malnutrition related to acute illness as evidenced by energy intake < 75% for > 7 days, mild muscle depletion, mild fat depletion. - remains applicable  GOAL:   Patient will meet greater than or equal to 90% of their needs - addressing via meals, snacks and supplements  MONITOR:   PO intake, Supplement acceptance, I & O's, Labs, Weight trends  REASON FOR ASSESSMENT:   Consult Assessment of nutrition requirement/status  ASSESSMENT:   Pt with hx of DM type 2, and HTN presented to outside hospital 5/26 where workup showed MSSA bacteremia and osteonecrosis of the lumbar spine. Transferred to [facility] 5/28 for further workup.  6/1 - FEES, DYS 3, thin liquids 6/4 - TEE showed preserved EF and no vegetation  6/23 - s/p IVC filter placement  Plans for L3-4, L4-5 and L5-S1 laminotomy/foraminotomies today   Pt sitting up in bed in good spirits. Family present at bedside as well. Pt recalls poor intake yesterday. He recalls eating 5 cups of jello. He enjoys the nutrition supplements and consumed 1 Mighty shake and 2 magic cups yesterday. Discussed adding snacks to have on the unit to offer between meals or when missing a meal. He is agreeable and appreciative of this recommendation.   Meal completions: 6/18: 60% breakfast, 80% lunch 6/21: 50% breakfast, 50% lunch, 50% dinner 6/23: 80% breakfast  Edema: non-pitting BUE, moderate pitting BLE, mild pitting perineal   Medications: ferrous sulfate, SSI 0-15  units TID, SSI 0-5 units at bedtime, semglee 15 units at bedtime, melatonin, MVI, protonix, senna  Labs reviewed  CBG's 103-292 x24 hours  Diet Order:   Diet Order             Diet NPO time specified  Diet effective midnight           Diet regular Room service appropriate? Yes; Fluid consistency: Thin  Diet effective now                   EDUCATION NEEDS:   Education needs have been addressed  Skin:  Skin Assessment: Skin Integrity Issues: Skin Integrity Issues:: Stage I, Stage II Stage I: bilateral heels Stage II: bilateral buttocks  Last BM:  6/24 (type 6 x2 small)  Height:   Ht Readings from Last 1 Encounters:  01/19/23 6' (1.829 m)    Weight:   Wt Readings from Last 1 Encounters:  01/19/23 91.3 kg    Ideal Body Weight:  80.9 kg  BMI:  Body mass index is 27.3 kg/m.  Estimated Nutritional Needs:   Kcal:  2000-2200 kcal/d  Protein:  95-115 g/d  Fluid:  >/=2L/d  Drusilla Kanner, RDN, LDN Clinical Nutrition

## 2023-01-21 ENCOUNTER — Encounter (HOSPITAL_COMMUNITY): Payer: Self-pay | Admitting: Neurosurgery

## 2023-01-21 DIAGNOSIS — M4646 Discitis, unspecified, lumbar region: Secondary | ICD-10-CM | POA: Diagnosis not present

## 2023-01-21 LAB — GLUCOSE, CAPILLARY
Glucose-Capillary: 204 mg/dL — ABNORMAL HIGH (ref 70–99)
Glucose-Capillary: 213 mg/dL — ABNORMAL HIGH (ref 70–99)
Glucose-Capillary: 173 mg/dL — ABNORMAL HIGH (ref 70–99)
Glucose-Capillary: 297 mg/dL — ABNORMAL HIGH (ref 70–99)

## 2023-01-21 LAB — TYPE AND SCREEN
ABO/RH(D): A NEG
Antibody Screen: NEGATIVE
Unit division: 0

## 2023-01-21 LAB — PREPARE RBC (CROSSMATCH)

## 2023-01-21 LAB — CBC
HCT: 27.7 % — ABNORMAL LOW (ref 39.0–52.0)
Hemoglobin: 8.4 g/dL — ABNORMAL LOW (ref 13.0–17.0)
MCH: 26.1 pg (ref 26.0–34.0)
MCHC: 30.3 g/dL (ref 30.0–36.0)
MCV: 86 fL (ref 80.0–100.0)
Platelets: 214 10*3/uL (ref 150–400)
RBC: 3.22 MIL/uL — ABNORMAL LOW (ref 4.22–5.81)
RDW: 18.4 % — ABNORMAL HIGH (ref 11.5–15.5)
WBC: 10.6 10*3/uL — ABNORMAL HIGH (ref 4.0–10.5)
nRBC: 0 % (ref 0.0–0.2)

## 2023-01-21 LAB — BPAM RBC
Blood Product Expiration Date: 202407172359
ISSUE DATE / TIME: 202406241925
Unit Type and Rh: 600
Unit Type and Rh: 600

## 2023-01-21 LAB — APTT: aPTT: 37 seconds — ABNORMAL HIGH (ref 24–36)

## 2023-01-21 LAB — BASIC METABOLIC PANEL
Anion gap: 8 (ref 5–15)
BUN: 13 mg/dL (ref 8–23)
CO2: 26 mmol/L (ref 22–32)
Calcium: 8.2 mg/dL — ABNORMAL LOW (ref 8.9–10.3)
Chloride: 101 mmol/L (ref 98–111)
Creatinine, Ser: 0.68 mg/dL (ref 0.61–1.24)
GFR, Estimated: >60 mL/min (ref >60)
Glucose, Bld: 220 mg/dL — ABNORMAL HIGH (ref 70–99)
Potassium: 4.3 mmol/L (ref 3.5–5.1)
Sodium: 135 mmol/L (ref 135–145)

## 2023-01-21 LAB — AEROBIC/ANAEROBIC CULTURE W GRAM STAIN (SURGICAL/DEEP WOUND)

## 2023-01-21 NOTE — Progress Notes (Signed)
Orthopedic Tech Progress Note Patient Details:  Jim Ramirez 1951-02-09 [id number]  Patient ID: [name], male   DOB: 03-Jun-1951, 73 y.o.   MRN: [id number] The rn called to inquire about a lso brace. I spoke with them about what might have happened to the one that was delivered on 5/30. We thought the family may have taken it home. They will talk with the family to find out. Then call back if still needed. Trinna Post 01/21/2023, 5:36 AM

## 2023-01-21 NOTE — Evaluation (Signed)
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 Rt wrist edema with tenosynovitis, new Afib with RVR. MRI brain negative for acute infarct, but with pyogenic ventriculitis. Bil UE superficial thrombosis. LE Doppler 6/22 positive for acute LLE DVT. 6/23 IR placement of IVC filter. 6/24 L3-S1 laminotomy and decompression. PMhx: ACDF, HTN, T2DM   Clinical Impression   Pt reports independence at baseline with ADLs/functional mobility, lives with brother PTA. Pt currently seen s/p surgery, needing set up - max A for ADLs, and max A +2 to stand from chair level  with RW for repositioning, cues to stand upright as wanting to flex trunk in standing. Pt educated on back precautions and brace wear, pt verbalized understanding. Pt presenting with impairments listed below, will follow acutely. Patient will benefit from continued inpatient follow up therapy, <3 hours/day to maximize safety/ind with ADLs/functional mobility.      Recommendations for follow up therapy are one component of a multi-disciplinary discharge planning process, led by the attending physician.  Recommendations may be updated based on patient status, additional functional criteria and insurance authorization.   Assistance Recommended at Discharge Frequent or constant Supervision/Assistance  Patient can return home with the following A lot of help with bathing/dressing/bathroom;A lot of help with walking and/or transfers;Two people to help with walking and/or transfers;Assistance with cooking/housework;Assist for transportation;Direct supervision/assist for medications management;Help with stairs or ramp for entrance;Assistance with feeding    Functional Status Assessment  Patient has had a recent decline in their functional status and demonstrates the  ability to make significant improvements in function in a reasonable and predictable amount of time.  Equipment Recommendations  Other (comment) (defer)    Recommendations for Other Services PT consult     Precautions / Restrictions Precautions Precautions: Back;Fall Precaution Booklet Issued: No Precaution Comments: discussed back precautions and brace wear Required Braces or Orthoses: Spinal Brace Spinal Brace: Lumbar corset;Applied in sitting position Restrictions Weight Bearing Restrictions: No      Mobility Bed Mobility               General bed mobility comments: OOB in chair upon arrival and departure    Transfers Overall transfer level: Needs assistance Equipment used: Rolling walker (2 wheels) Transfers: Sit to/from Stand Sit to Stand: Max assist, +2 physical assistance           General transfer comment: stood x1 from chair level to place another pillow under pt      Balance Overall balance assessment: Needs assistance Sitting-balance support: Bilateral upper extremity supported Sitting balance-Leahy Scale: Fair     Standing balance support: Bilateral upper extremity supported, During functional activity, Reliant on assistive device for balance Standing balance-Leahy Scale: Poor Standing balance comment: reliant on RW support                           ADL either performed or assessed with clinical judgement   ADL Overall ADL's : Needs assistance/impaired Eating/Feeding: Set up;Sitting   Grooming: Set up;Sitting   Upper Body Bathing: Moderate assistance   Lower Body Bathing: Maximal assistance   Upper Body Dressing : Moderate assistance   Lower Body Dressing: Maximal assistance   Toilet Transfer: Maximal assistance;+2 for physical assistance   Toileting- Clothing Manipulation and Hygiene: Total assistance       Functional  mobility during ADLs: Maximal assistance;+2 for physical assistance       Vision Baseline  Vision/History: 1 Wears glasses Patient Visual Report: No change from baseline Vision Assessment?: No apparent visual deficits     Perception Perception Perception Tested?: No   Praxis Praxis Praxis tested?: Not tested    Pertinent Vitals/Pain Pain Assessment Pain Assessment: No/denies pain     Hand Dominance Right   Extremity/Trunk Assessment Upper Extremity Assessment Upper Extremity Assessment: Generalized weakness RUE Deficits / Details: edema   Lower Extremity Assessment Lower Extremity Assessment: Defer to PT evaluation   Cervical / Trunk Assessment Cervical / Trunk Assessment: Other exceptions Cervical / Trunk Exceptions: back brace   Communication Communication Communication: No difficulties   Cognition Arousal/Alertness: Awake/alert Behavior During Therapy: WFL for tasks assessed/performed Overall Cognitive Status: Within Functional Limits for tasks assessed                                       General Comments  VSS on RA; provided pt with new IS    Exercises     Shoulder Instructions      Home Living Family/patient expects to be discharged to:: Private residence Living Arrangements: Other relatives (brother) Available Help at Discharge: Family;Available 24 hours/day Type of Home: Apartment Home Access: Level entry     Home Layout: One level     Bathroom Shower/Tub: Tub/shower unit;Curtain   Bathroom Toilet: Handicapped height     Home Equipment: Agricultural consultant (2 wheels);BSC/3in1          Prior Functioning/Environment Prior Level of Function : Independent/Modified Independent;Driving             Mobility Comments: walks 10 miles a day ADLs Comments: Ind with ADLs, IADLs, cooks and drivesl. 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        OT Problem List: Decreased strength;Decreased activity tolerance;Increased edema;Impaired balance (sitting and/or standing);Decreased coordination;Decreased range of motion;Pain      OT  Treatment/Interventions: Self-care/ADL training;Balance training;Therapeutic exercise;DME and/or AE instruction;Therapeutic activities;Patient/family education    OT Goals(Current goals can be found in the care plan section) Acute Rehab OT Goals Patient Stated Goal: none stated OT Goal Formulation: With patient Time For Goal Achievement: 01/15/23 Potential to Achieve Goals: Good  OT Frequency: Min 2X/week    Co-evaluation              AM-PAC OT "6 Clicks" Daily Activity     Outcome Measure Help from another person eating meals?: A Little Help from another person taking care of personal grooming?: A Little Help from another person toileting, which includes using toliet, bedpan, or urinal?: A Lot Help from another person bathing (including washing, rinsing, drying)?: A Lot Help from another person to put on and taking off regular upper body clothing?: A Lot Help from another person to put on and taking off regular lower body clothing?: A Lot 6 Click Score: 14   End of Session Equipment Utilized During Treatment: Gait belt;Rolling walker (2 wheels);Back brace Nurse Communication: Mobility status  Activity Tolerance: Patient tolerated treatment well Patient left: in chair;with call bell/phone within reach;with chair alarm set  OT Visit Diagnosis: Other abnormalities of gait and mobility (R26.89);Muscle weakness (generalized) (M62.81);Pain                Time: 9323-5573 OT Time Calculation (min): 25 min Charges:  OT General Charges $OT Visit: 1 Visit OT Evaluation $OT Re-eval:  1 Re-eval OT Treatments $Self Care/Home Management : 8-22 mins  Carver Fila, OTD, OTR/L SecureChat Preferred Acute Rehab [phone]   Dalphine Handing 01/21/2023, 12:24 PM

## 2023-01-21 NOTE — Progress Notes (Signed)
Pt. sleeping at time of F/U spiritual care visit. Revisit planned.  Chaplain Stephanie Acre  [phone]

## 2023-01-21 NOTE — Progress Notes (Signed)
Providing Compassionate, Quality Care - Together   Subjective: Patient reports he feels much better. He is up to his chair eating breakfast this morning.  Objective: Vital signs in last 24 hours: Temp:  [97.6 F (36.4 C)-98.7 F (37.1 C)] 97.6 F (36.4 C) (06/25 0719) Pulse Rate:  [86-97] 94 (06/25 0719) Resp:  [10-20] 10 (06/25 0719) BP: (96-132)/(59-85) 103/62 (06/25 0719) SpO2:  [93 %-100 %] 98 % (06/25 0719)  Intake/Output from previous day: 06/24 0701 - 06/25 0700 In: 2306.6 [I.V.:1741.6; Blood:315; IV Piggyback:250] Out: 1500 [Urine:1250; Blood:250] Intake/Output this shift: Total I/O In: -  Out: 200 [Urine:200]  Alert and oriented x 4 PERRLA MAE, Mild generalized weakness Incision is covered with Honeycomb dressing and Steri Strips; Dressing is clean, dry, and intact   Lab Results: Recent Labs    01/20/23 0744 01/21/23 0440  WBC 10.9* 10.6*  HGB 7.9* 8.4*  HCT 26.0* 27.7*  PLT 223 214   BMET Recent Labs    01/19/23 0512 01/21/23 0440  NA 136 135  K 3.8 4.3  CL 100 101  CO2 26 26  GLUCOSE 145* 220*  BUN 15 13  CREATININE 1.07 0.68  CALCIUM 8.5* 8.2*    Studies/Results: DG Lumbar Spine 1 View  Result Date: 01/20/2023 CLINICAL DATA:  Intraoperative localization image EXAM: LUMBAR SPINE - 1 VIEW COMPARISON:  01/14/2023 FINDINGS: Cross-table lateral view of the lumbar spine was performed. Surgical instrumentation is identified posterior to the L4-5 disc space. IMPRESSION: 1. Localization image, with instrumentation posterior to the L4-5 disc space. Electronically Signed   By: Sharlet Salina M.D.   On: 01/20/2023 20:34   IR IVC FILTER PLMT / S&I Lenise Arena GUID/MOD SED  Result Date: 01/19/2023 INDICATION: 72 year old male on chronic anticoagulation who is currently hospitalized with an epidural abscess. His anticoagulation was stopped in order for him to undergo surgical decompression, however DVT ultrasound demonstrates new acute DVT in the calf veins.  Therefore, patient requires placement of a retrievable IVC filter for PE prophylaxis in the perioperative timeframe. EXAM: ULTRASOUND GUIDANCE FOR VASCULARACCESS IVC CATHETERIZATION AND VENOGRAM IVC FILTER INSERTION Interventional Radiologist:  Sterling Big, MD MEDICATIONS: None. ANESTHESIA/SEDATION: 0.5 mg Dilaudid administered for pain control FLUOROSCOPY TIME:  Radiation exposure index: 37 mGy reference air kerma COMPLICATIONS: None immediate. PROCEDURE: Informed written consent was obtained from the patient after a thorough discussion of the procedural risks, benefits and alternatives. All questions were addressed. Maximal Sterile Barrier Technique was utilized including caps, mask, sterile gowns, sterile gloves, sterile drape, hand hygiene and skin antiseptic. A timeout was performed prior to the initiation of the procedure. Maximal barrier sterile technique utilized including caps, mask, sterile gowns, sterile gloves, large sterile drape, hand hygiene, and Betadine prep. Under sterile condition and local anesthesia, right internal jugular venous access was performed with ultrasound. An ultrasound image was saved and sent to PACS. Over a guidewire, the IVC filter delivery sheath and inner dilator were advanced into the IVC just above the IVC bifurcation. Contrast injection was performed for an IVC venogram. Through the delivery sheath, a retrievable Bard Denali IVC filter was deployed below the level of the renal veins and above the IVC bifurcation. Limited post deployment venacavagram was performed. The delivery sheath was removed and hemostasis was obtained with manual compression. A dressing was placed. The patient tolerated the procedure well without immediate post procedural complication. FINDINGS: The IVC is patent. No evidence of thrombus, stenosis, or occlusion. No variant venous anatomy. Successful placement of the IVC filter below  the level of the renal veins. IMPRESSION: Successful ultrasound  and fluoroscopically guided placement of an infrarenal retrievable IVC filter via right jugular approach. PLAN: This IVC filter is potentially retrievable. The patient will be assessed for filter retrieval by Interventional Radiology in approximately 8-12 weeks. Further recommendations regarding filter retrieval, continued surveillance or declaration of device permanence, will be made at that time. Electronically Signed   By: Malachy Moan M.D.   On: 01/19/2023 11:37    Assessment/Plan: Patient admitted with significant back pain with radiation down BLE. He was septic with elevated troponin and a-fib with RVR. 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 Patient underwent left L3-4, L4-5 and L5-S1 laminotomy/foraminotomies for drainage of epidural abscess and decompression of the thecal sac by Dr. Lovell Sheehan on 01/20/2023.   LOS: 28 days   -Mobilize with therapies -Epidural abscess cultures pending, no growth at 12 hours -Continue abx per ID   Val Eagle, DNP, AGNP-C Nurse Practitioner  [facility] [address], [address], [place], [place] [address] P: [phone]    F: [phone]  01/21/2023, 9:39 AM

## 2023-01-21 NOTE — Progress Notes (Signed)
PROGRESS NOTE  Jim Ramirez  XBJ:[id number] DOB: 1951/03/22 DOA: 12/24/2022 PCP: [name], MD   Brief Narrative:  Patient is a 72 year old male with history of  hypertension and diabetes who initially presented to [facility] on 12/22/2022 for chronic back pain radiating both her legs, with some shortness of breath that started 1 week prior to admission. CT scan of the abdomen /pelvis showed lumbar spine discitis and osteomyelitis, transferred to [facility] . He was found to be bacteremic with staph aureus with necrosis of the lumbar spine, also in A-fib with RVR, neurology, ID and cardiology were consulted. MRI of the wrist was done that showed fluid collection and surgery was consulted who recommended conservative management. Despite IV antibiotics, he continued to spike fevers.  MRI lumbar spine done on 6/7 showed worsening of infection/concerns of epidural abscess .Initial blood cultures at [facility] grew MSSA. TEE from 6/4 does not show any vegetation but MRI does show small fluid collection.Infectious disease recommended IV nafcillin from negative cultures on 5/28 until 01/10/2023, then switched to cefazolin 2 g IV every 8 hourly for additional 6 weeks, thereafter cefadroxil 1000 mg twice daily (starting 7/24) for at least 10 months.   Due to progressive low back pain radiating down legs, MRI repeated 6/7 showing persistent L4, L5 osteonecrosis, probable L4/5 discitis with right L4-5 septic facet, multiple psoas intramuscular loculated fluid collections. Despite continued IV antibiotics, a repeat MRI 6/18 showing new epidural abscess and stenosis. S/P   left L3-4, L4-5 and L5-S1 laminotomy/foraminotomies for drainage of epidural abscess and decompression of the thecal sac by Dr. Lovell Sheehan on 01/20/2023 . PT/OT recommending SNF on discharge  Assessment & Plan:  Principal Problem:   Discitis of lumbar region Active Problems:   Diabetes mellitus type 2 in nonobese (HCC)   Dyslipidemia    Essential hypertension   New onset atrial fibrillation (HCC)   Sepsis (HCC)   BPH (benign prostatic hyperplasia)   Mood disorder (HCC)   MSSA bacteremia   Malnutrition of moderate degree  Epidural abscess: Presented with acute exacerbation of chronic back pain.  Hospital course was remarkable for fever.  MRI was concerning for epidural abscess.  Initial blood cultures at [facility] showed MSSA, blood culture sent here did not show any growth.  ID was following ,cefazolin 2 g IV every 8 hourly for additional 6 weeks, thereafter cefadroxil 1000 mg twice daily (starting 7/24) for at least 10 months. S/P   left L3-4, L4-5 and L5-S1 laminotomy/foraminotomies for drainage of epidural abscess and decompression of the thecal sac by Dr. Lovell Sheehan on 01/20/2023 . Follow-up wound culture.  Currently hemodynamically stable, afebrile.  Left wrist edema/concern for tenosynovitis: - MRI shows persistent and worsening abscess of the right wrist, [facility] was consulted, status post bedside aspiration 6/10 but not enough sample.  Grossly did not appear purulent per their documentation. Planning to continue antibiotics.   Acute kidney injury: Resolved.   Osteonecrosis of L4-L5: - Continue supportive care    New onset paroxysmal atrial fibrillation: - Continue amiodarone, now down to 200mg  daily, remains in NSR. - Eliquis was held for neurosurgery -Currently heart rate is well-controlled   Iron deficiency anemia:  - Continue iron supplements  -Hb stable   Elevate troponin: -Likely demand ischemia.   -No chest pain.     Constipation: Continue bowel regimen,having regular BMs   BPH: -Continue Flomax.   Diabetes mellitus type 2 with hyperglycemia: -A1c of 8.1% - Continue current insulin regimen   Obstructive sleep apnea: -Continue  CPAP at night.   Hyperlipidemia:  - Continue statin   Mood disorder: - Continue lexapro daily, risperdal qHS   Moderate protein caloric malnutrition: -  Supplement protein.   LLE ACUTE DVT : Bilateral lower extremity duplex showed acute DVT involving left gastrocnemius, left posterior tibial veins.  IR consulted ,sp  IVC filter placement.We will resume Eliquis when appropriate/clearance from neurosurgery .  Left lower extremity  remains edematous   Goals of care/disposition: - Patient has already been seen by palliative care.  Currently DNR but continue full treatment. PT/OT recommending SNF on discharge  Nutrition Problem: Moderate Malnutrition Etiology: acute illness Pressure Injury 01/03/23 Buttocks Right;Left Stage 2 -  Partial thickness loss of dermis presenting as a shallow open injury with a red, pink wound bed without slough. 1x1 (Active)  01/03/23 2100  Location: Buttocks  Location Orientation: Right;Left  Staging: Stage 2 -  Partial thickness loss of dermis presenting as a shallow open injury with a red, pink wound bed without slough.  Wound Description (Comments): 1x1  Present on Admission: No  Dressing Type Foam - Lift dressing to assess site every shift 01/21/23 0719     Pressure Injury 01/18/23 Heel Left Stage 1 -  Intact skin with non-blanchable redness of a localized area usually over a bony prominence. (Active)  01/18/23 1337  Location: Heel  Location Orientation: Left  Staging: Stage 1 -  Intact skin with non-blanchable redness of a localized area usually over a bony prominence.  Wound Description (Comments):   Present on Admission:   Dressing Type Foam - Lift dressing to assess site every shift 01/21/23 0719     Pressure Injury 01/19/23 Heel Right Stage 1 -  Intact skin with non-blanchable redness of a localized area usually over a bony prominence. (Active)  01/19/23 1007  Location: Heel  Location Orientation: Right  Staging: Stage 1 -  Intact skin with non-blanchable redness of a localized area usually over a bony prominence.  Wound Description (Comments):   Present on Admission:   Dressing Type Foam - Lift  dressing to assess site every shift 01/21/23 0719    DVT prophylaxis:SCD's Start: 01/20/23 2247 SCDs Start: 12/24/22 1740     Code Status: DNR  Family Communication: None at bedside  Patient status:Inpatient  Patient is from :Home  Anticipated discharge to:SNF  Estimated DC date:not sure   Consultants: Neurosurgery, ID, IR  Procedures: Laminectomies,IVC filter placement  Antimicrobials:  Anti-infectives (From admission, onward)    Start     Dose/Rate Route Frequency Ordered Stop   02/19/23 1000  cefadroxil (DURICEF) capsule 1,000 mg        1,000 mg Oral 2 times daily 01/13/23 1117 11/26/23 0959   01/20/23 2345  ceFAZolin (ANCEF) IVPB 2g/100 mL premix  Status:  Discontinued        2 g 200 mL/hr over 30 Minutes Intravenous Every 8 hours 01/20/23 2246 01/20/23 2249   01/20/23 1630  ceFAZolin (ANCEF) IVPB 2g/100 mL premix        2 g 200 mL/hr over 30 Minutes Intravenous On call to O.R. 01/20/23 1607 01/20/23 1813   01/11/23 0600  ceFAZolin (ANCEF) IVPB 2g/100 mL premix        2 g 200 mL/hr over 30 Minutes Intravenous Every 8 hours 12/31/22 1449 02/18/23 2359   12/28/22 1015  nafcillin 12 g in sodium chloride 0.9 % 500 mL continuous infusion        12 g 20.8 mL/hr over 24 Hours Intravenous Every 24 hours  12/28/22 0925 01/11/23 1245   12/28/22 0830  nafcillin 2 g in sodium chloride 0.9 % 100 mL IVPB  Status:  Discontinued        2 g 216 mL/hr over 30 Minutes Intravenous Every 4 hours 12/28/22 0736 12/28/22 0925   12/26/22 1400  ceFAZolin (ANCEF) IVPB 2g/100 mL premix  Status:  Discontinued        2 g 200 mL/hr over 30 Minutes Intravenous Every 8 hours 12/26/22 0823 12/28/22 0736   12/25/22 2200  vancomycin (VANCOREADY) IVPB 1750 mg/350 mL  Status:  Discontinued        1,750 mg 175 mL/hr over 120 Minutes Intravenous Every 24 hours 12/25/22 0919 12/26/22 0823   12/25/22 1200  cefTRIAXone (ROCEPHIN) 2 g in sodium chloride 0.9 % 100 mL IVPB  Status:  Discontinued        2  g 200 mL/hr over 30 Minutes Intravenous Every 24 hours 12/25/22 0915 12/25/22 0935   12/24/22 2000  ceFEPIme (MAXIPIME) 2 g in sodium chloride 0.9 % 100 mL IVPB  Status:  Discontinued        2 g 200 mL/hr over 30 Minutes Intravenous Every 8 hours 12/24/22 1856 12/25/22 0837   12/24/22 2000  vancomycin (VANCOREADY) IVPB 1750 mg/350 mL  Status:  Discontinued        1,750 mg 175 mL/hr over 120 Minutes Intravenous Every 24 hours 12/24/22 1857 12/25/22 0837   12/24/22 1930  metroNIDAZOLE (FLAGYL) IVPB 500 mg  Status:  Discontinued        500 mg 100 mL/hr over 60 Minutes Intravenous Every 12 hours 12/24/22 1830 12/25/22 0837       Subjective: Patient seen and examined at bedside today.  Hemodynamically stable.  He was working with occupational therapy.  He was sitting on the chair.  Denies any shortness of breath, cough, abdominal pain.  Appears comfortable.  Having regular bowel movement.  Objective: Vitals:   01/20/23 2230 01/20/23 2300 01/21/23 0324 01/21/23 0719  BP: 111/76 128/72 122/73 103/62  Pulse: 94 97 89 94  Resp: 14 12 15 10   Temp:  98.2 F (36.8 C) 98.7 F (37.1 C) 97.6 F (36.4 C)  TempSrc:  Oral Oral Axillary  SpO2:   100% 98%  Weight:      Height:        Intake/Output Summary (Last 24 hours) at 01/21/2023 0946 Last data filed at 01/21/2023 0720 Gross per 24 hour  Intake 2306.57 ml  Output 1550 ml  Net 756.57 ml   Filed Weights   01/05/23 0346 01/14/23 1241 01/19/23 0800  Weight: 86.5 kg 91.3 kg 91.3 kg    Examination:  General exam: Overall comfortable, not in distress HEENT: PERRL Respiratory system:  no wheezes or crackles  Cardiovascular system: S1 & S2 heard, RRR.  Gastrointestinal system: Abdomen is nondistended, soft and nontender. Central nervous system: Alert and oriented Extremities: Left lower extremity  edema, no clubbing ,no cyanosis Skin: No rashes, no ulcers,no icterus     Data Reviewed: I have personally reviewed following labs and  imaging studies  CBC: Recent Labs  Lab 01/16/23 0514 01/18/23 0525 01/19/23 0512 01/20/23 0744 01/21/23 0440  WBC 9.4 9.7 9.5 10.9* 10.6*  HGB 8.2* 7.7* 7.6* 7.9* 8.4*  HCT 27.4* 26.1* 25.8* 26.0* 27.7*  MCV 84.3 85.9 86.3 87.0 86.0  PLT 155 205 217 223 214   Basic Metabolic Panel: Recent Labs  Lab 01/16/23 0514 01/18/23 0525 01/19/23 0512 01/21/23 0440  NA 138 138 136  135  K 3.5 3.5 3.8 4.3  CL 104 101 100 101  CO2 27 26 26 26   GLUCOSE 143* 138* 145* 220*  BUN 18 16 15 13   CREATININE 1.29* 1.15 1.07 0.68  CALCIUM 8.4* 8.7* 8.5* 8.2*     Recent Results (from the past 240 hour(s))  Surgical pcr screen     Status: None   Collection Time: 01/20/23  7:19 AM   Specimen: Nasal Mucosa; Nasal Swab  Result Value Ref Range Status   MRSA, PCR NEGATIVE NEGATIVE Final   Staphylococcus aureus NEGATIVE NEGATIVE Final    Comment: (NOTE) The Xpert SA Assay (FDA approved for NASAL specimens in patients 30 years of age and older), is one component of a comprehensive surveillance program. It is not intended to diagnose infection nor to guide or monitor treatment. Performed at [facility], [address], [place], [place] [address]   Aerobic/Anaerobic Culture w Gram Stain (surgical/deep wound)     Status: None (Preliminary result)   Collection Time: 01/20/23  7:01 PM   Specimen: Wound; Abscess  Result Value Ref Range Status   Specimen Description WOUND  Final   Special Requests LUMBAR EPIDURAL SPACE  Final   Gram Stain   Final    FEW WBC PRESENT,BOTH PMN AND MONONUCLEAR NO ORGANISMS SEEN    Culture   Final    NO GROWTH < 12 HOURS Performed at [facility], [address], [place], [place] [address]    Report Status PENDING  Incomplete  Aerobic/Anaerobic Culture w Gram Stain (surgical/deep wound)     Status: None (Preliminary result)   Collection Time: 01/20/23  7:27 PM   Specimen: Abscess  Result Value Ref Range Status   Specimen Description ABSCESS  Final    Special Requests EPIDURAL ABSCESS  Final   Gram Stain   Final    RARE WBC PRESENT,BOTH PMN AND MONONUCLEAR NO ORGANISMS SEEN    Culture   Final    NO GROWTH < 12 HOURS Performed at [facility], [address], [place], [place] [address]    Report Status PENDING  Incomplete     Radiology Studies: DG Lumbar Spine 1 View  Result Date: 01/20/2023 CLINICAL DATA:  Intraoperative localization image EXAM: LUMBAR SPINE - 1 VIEW COMPARISON:  01/14/2023 FINDINGS: Cross-table lateral view of the lumbar spine was performed. Surgical instrumentation is identified posterior to the L4-5 disc space. IMPRESSION: 1. Localization image, with instrumentation posterior to the L4-5 disc space. Electronically Signed   By: Sharlet Salina M.D.   On: 01/20/2023 20:34   IR IVC FILTER PLMT / S&I Lenise Arena GUID/MOD SED  Result Date: 01/19/2023 INDICATION: 72 year old male on chronic anticoagulation who is currently hospitalized with an epidural abscess. His anticoagulation was stopped in order for him to undergo surgical decompression, however DVT ultrasound demonstrates new acute DVT in the calf veins. Therefore, patient requires placement of a retrievable IVC filter for PE prophylaxis in the perioperative timeframe. EXAM: ULTRASOUND GUIDANCE FOR VASCULARACCESS IVC CATHETERIZATION AND VENOGRAM IVC FILTER INSERTION Interventional Radiologist:  Sterling Big, MD MEDICATIONS: None. ANESTHESIA/SEDATION: 0.5 mg Dilaudid administered for pain control FLUOROSCOPY TIME:  Radiation exposure index: 37 mGy reference air kerma COMPLICATIONS: None immediate. PROCEDURE: Informed written consent was obtained from the patient after a thorough discussion of the procedural risks, benefits and alternatives. All questions were addressed. Maximal Sterile Barrier Technique was utilized including caps, mask, sterile gowns, sterile gloves, sterile drape, hand hygiene and skin antiseptic. A timeout  was performed prior to the initiation of  the procedure. Maximal barrier sterile technique utilized including caps, mask, sterile gowns, sterile gloves, large sterile drape, hand hygiene, and Betadine prep. Under sterile condition and local anesthesia, right internal jugular venous access was performed with ultrasound. An ultrasound image was saved and sent to PACS. Over a guidewire, the IVC filter delivery sheath and inner dilator were advanced into the IVC just above the IVC bifurcation. Contrast injection was performed for an IVC venogram. Through the delivery sheath, a retrievable Bard Denali IVC filter was deployed below the level of the renal veins and above the IVC bifurcation. Limited post deployment venacavagram was performed. The delivery sheath was removed and hemostasis was obtained with manual compression. A dressing was placed. The patient tolerated the procedure well without immediate post procedural complication. FINDINGS: The IVC is patent. No evidence of thrombus, stenosis, or occlusion. No variant venous anatomy. Successful placement of the IVC filter below the level of the renal veins. IMPRESSION: Successful ultrasound and fluoroscopically guided placement of an infrarenal retrievable IVC filter via right jugular approach. PLAN: This IVC filter is potentially retrievable. The patient will be assessed for filter retrieval by Interventional Radiology in approximately 8-12 weeks. Further recommendations regarding filter retrieval, continued surveillance or declaration of device permanence, will be made at that time. Electronically Signed   By: Malachy Moan M.D.   On: 01/19/2023 11:37    Scheduled Meds:  acetaminophen  1,000 mg Oral Q6H   amiodarone  200 mg Oral Daily   azelastine  1 spray Each Nare BID   [START ON 02/19/2023] cefadroxil  1,000 mg Oral BID   Chlorhexidine Gluconate Cloth  6 each Topical Daily   diclofenac Sodium  2 g Topical QID   docusate sodium  100 mg Oral BID   escitalopram  10 mg Oral Daily   ferrous  sulfate  325 mg Oral Q breakfast   fluticasone  2 spray Each Nare Daily   gabapentin  100 mg Oral BID   insulin aspart  0-15 Units Subcutaneous TID WC   insulin aspart  0-5 Units Subcutaneous QHS   insulin glargine-yfgn  15 Units Subcutaneous QHS   ketotifen  1 drop Left Eye BID   lidocaine  2 patch Transdermal Q24H   loratadine  10 mg Oral Daily   LORazepam  0.5 mg Oral Once   melatonin  3 mg Oral QHS   methocarbamol  500 mg Oral BID   multivitamin with minerals  1 tablet Oral Daily   pantoprazole  40 mg Oral BID   risperiDONE  0.5 mg Oral QHS   rosuvastatin  20 mg Oral Daily   senna  2 tablet Oral Daily   sodium chloride flush  10-40 mL Intracatheter Q12H   sodium chloride flush  3 mL Intravenous Q12H   sodium chloride flush  3 mL Intravenous Q12H   tamsulosin  0.4 mg Oral Daily   Continuous Infusions:  sodium chloride     sodium chloride     sodium chloride Stopped (01/20/23 2250)    ceFAZolin (ANCEF) IV 2 g (01/21/23 0436)     LOS: 28 days   Burnadette Pop, MD [facility] P6/25/2024, 9:46 AM

## 2023-01-21 NOTE — Progress Notes (Signed)
Physical Therapy Treatment Patient Details Name: Jim Ramirez MRN: [id number] DOB: 23-Nov-1950 Today's Date: 01/21/2023   History of Present Illness 72 yo male admitted 5/28 with back pain with osteonecrosis of lumbar spine L4-5. Rt wrist edema with tenosynovitis, new Afib with RVR. MRI brain negative for acute infarct, but with pyogenic ventriculitis. Bil UE superficial thrombosis. LE Doppler 6/22 positive for acute LLE DVT. 6/23 IR placement of IVC filter. 6/24 L3-S1 laminotomy and decompression. PMhx: ACDF, HTN, T2DM    PT Comments    Pt pleasant and educated for precautions, brace wear and safety. Pt incontinent of stool on arrival without awareness. Pt with improving ability with transfers and use of RW. Pt initiated gait this session and able to step forward but struggled significantly with pivot and continue to recommend use of Stedy for bed<>chair and chair follow for progressive gait trials. Will continue to follow with goals updated.     Recommendations for follow up therapy are one component of a multi-disciplinary discharge planning process, led by the attending physician.  Recommendations may be updated based on patient status, additional functional criteria and insurance authorization.  Follow Up Recommendations  Can patient physically be transported by private vehicle: No    Assistance Recommended at Discharge Frequent or constant Supervision/Assistance  Patient can return home with the following Assistance with cooking/housework;Assistance with feeding;Help with stairs or ramp for entrance;Assist for transportation;Direct supervision/assist for financial management;A lot of help with bathing/dressing/bathroom;Two people to help with walking and/or transfers   Equipment Recommendations  Hospital bed;Wheelchair (measurements PT);Wheelchair cushion (measurements PT);BSC/3in1;Rolling walker (2 wheels)    Recommendations for Other Services       Precautions / Restrictions  Precautions Precautions: Back;Fall Precaution Booklet Issued: No Precaution Comments: discussed back precautions and brace wear Required Braces or Orthoses: Spinal Brace Spinal Brace: Lumbar corset;Applied in sitting position Restrictions Weight Bearing Restrictions: No     Mobility  Bed Mobility Overal bed mobility: Needs Assistance Bed Mobility: Rolling, Sidelying to Sit Rolling: Min assist Sidelying to sit: Mod assist       General bed mobility comments: physical assist to roll, rise from side and position in sitting EOB    Transfers Overall transfer level: Needs assistance   Transfers: Sit to/from Stand Sit to Stand: Mod assist           General transfer comment: mod assist to stand from bed and chair with cues for hand placement and sequence. additional stand from chair with stedy with min assist. pt pivoted bed to chair with RW however when turning crossed feet and unable to straighten feet with max assist and end of pivot to get pelvis to chair.    Ambulation/Gait Ambulation/Gait assistance: Min assist, +2 safety/equipment Gait Distance (Feet): 5 Feet Assistive device: Rolling walker (2 wheels) Gait Pattern/deviations: Step-through pattern, Decreased stride length   Gait velocity interpretation: <1.8 ft/sec, indicate of risk for recurrent falls   General Gait Details: chair follow with pt able to walk 5' straight, assist for direction and balance   Stairs             Wheelchair Mobility    Modified Rankin (Stroke Patients Only)       Balance Overall balance assessment: Needs assistance Sitting-balance support: Bilateral upper extremity supported Sitting balance-Leahy Scale: Fair     Standing balance support: Bilateral upper extremity supported, During functional activity, Reliant on assistive device for balance Standing balance-Leahy Scale: Poor Standing balance comment: Rw and stedy in standing  Cognition Arousal/Alertness: Awake/alert Behavior During Therapy: WFL for tasks assessed/performed Overall Cognitive Status: Within Functional Limits for tasks assessed                                          Exercises      General Comments        Pertinent Vitals/Pain Pain Assessment Pain Assessment: No/denies pain    Home Living                          Prior Function            PT Goals (current goals can now be found in the care plan section) Acute Rehab PT Goals Patient Stated Goal: return to walking PT Goal Formulation: With patient Time For Goal Achievement: 02/04/23 Potential to Achieve Goals: Fair Progress towards PT goals: Progressing toward goals;Goals met and updated - see care plan    Frequency    Min 1X/week      PT Plan Current plan remains appropriate    Co-evaluation              AM-PAC PT "6 Clicks" Mobility   Outcome Measure  Help needed turning from your back to your side while in a flat bed without using bedrails?: A Lot Help needed moving from lying on your back to sitting on the side of a flat bed without using bedrails?: A Lot Help needed moving to and from a bed to a chair (including a wheelchair)?: A Lot Help needed standing up from a chair using your arms (e.g., wheelchair or bedside chair)?: A Lot Help needed to walk in hospital room?: A Lot Help needed climbing 3-5 steps with a railing? : Total 6 Click Score: 11    End of Session Equipment Utilized During Treatment: Gait belt;Back brace Activity Tolerance: Patient tolerated treatment well Patient left: in chair;with call bell/phone within reach;with chair alarm set;with nursing/sitter in room Nurse Communication: Mobility status;Need for lift equipment;Precautions (stedy) PT Visit Diagnosis: Other abnormalities of gait and mobility (R26.89);Muscle weakness (generalized) (M62.81)     Time: 4782-9562 PT Time Calculation (min) (ACUTE ONLY):  31 min  Charges:  $Therapeutic Activity: 23-37 mins                     Merryl Hacker, PT Acute Rehabilitation Services Office: [phone]    Enedina Finner Kashlynn Kundert 01/21/2023, 10:21 AM

## 2023-01-22 DIAGNOSIS — M4646 Discitis, unspecified, lumbar region: Secondary | ICD-10-CM | POA: Diagnosis not present

## 2023-01-22 LAB — BASIC METABOLIC PANEL
Anion gap: 6 (ref 5–15)
BUN: 9 mg/dL (ref 8–23)
CO2: 28 mmol/L (ref 22–32)
Calcium: 8.1 mg/dL — ABNORMAL LOW (ref 8.9–10.3)
Chloride: 102 mmol/L (ref 98–111)
Creatinine, Ser: 0.7 mg/dL (ref 0.61–1.24)
GFR, Estimated: >60 mL/min (ref >60)
Glucose, Bld: 190 mg/dL — ABNORMAL HIGH (ref 70–99)
Potassium: 3.6 mmol/L (ref 3.5–5.1)
Sodium: 136 mmol/L (ref 135–145)

## 2023-01-22 LAB — GLUCOSE, CAPILLARY
Glucose-Capillary: 180 mg/dL — ABNORMAL HIGH (ref 70–99)
Glucose-Capillary: 221 mg/dL — ABNORMAL HIGH (ref 70–99)
Glucose-Capillary: 206 mg/dL — ABNORMAL HIGH (ref 70–99)
Glucose-Capillary: 143 mg/dL — ABNORMAL HIGH (ref 70–99)

## 2023-01-22 LAB — CBC
HCT: 26.6 % — ABNORMAL LOW (ref 39.0–52.0)
Hemoglobin: 8.1 g/dL — ABNORMAL LOW (ref 13.0–17.0)
MCH: 26.2 pg (ref 26.0–34.0)
MCHC: 30.5 g/dL (ref 30.0–36.0)
MCV: 86.1 fL (ref 80.0–100.0)
Platelets: 205 10*3/uL (ref 150–400)
RBC: 3.09 MIL/uL — ABNORMAL LOW (ref 4.22–5.81)
RDW: 18.8 % — ABNORMAL HIGH (ref 11.5–15.5)
WBC: 10.8 10*3/uL — ABNORMAL HIGH (ref 4.0–10.5)
nRBC: 0 % (ref 0.0–0.2)

## 2023-01-22 LAB — AEROBIC/ANAEROBIC CULTURE W GRAM STAIN (SURGICAL/DEEP WOUND)

## 2023-01-22 NOTE — Progress Notes (Signed)
Palliative Medicine - Symptom Check   Met with patient at bedside  to offer emotional and general support through therapeutic listening and empathy  Patient reports "feeling good" after L3-S1 laminotomy and decompression on 6/24. States that his pain is being controlled and that he is encouraged that he is doing so well. He reports that he is gradually becoming more mobile.   No family in room but states that they will be in to visit today.  Barrie Folk MS Ed.S, RN Palliative Medicine Team Palliative Team Phone: [phone]

## 2023-01-22 NOTE — Progress Notes (Signed)
Mobility Specialist: Progress Note   01/22/23 1406  Mobility  Activity Ambulated with assistance in room  Level of Assistance Moderate assist, patient does 50-74%  Assistive Device Front wheel walker  Distance Ambulated (ft) 4 ft  Activity Response Tolerated well  Mobility Referral Yes  $Mobility charge 1 Mobility  Mobility Specialist Start Time (ACUTE ONLY) 1100  Mobility Specialist Stop Time (ACUTE ONLY) 1126  Mobility Specialist Time Calculation (min) (ACUTE ONLY) 26 min   Pre-Mobility: 90 HR, 133/74 (91) BP, 93% SpO2 During Mobility: 118 HR Post-Mobility: 115 HR, 96% SpO2  Pt received in the bed and agreeable to mobility. ModA with bed mobility as well as to stand from elevated bed height. C/o some back pain, no rating given. Pt to the chair and set up with his lunch. Call bell at his side. Chair alarm is on.   Manuela Halbur Mobility Specialist Please contact via SecureChat or Rehab office at [phone]

## 2023-01-22 NOTE — Progress Notes (Signed)
PROGRESS NOTE  Jim Ramirez  WNU:[id number] DOB: Dec 14, 1950 DOA: 12/24/2022 PCP: [name], MD   Brief Narrative:  Patient is a 72 year old male with history of  hypertension and diabetes who initially presented to [facility] on 12/22/2022 for chronic back pain radiating both her legs, with some shortness of breath that started 1 week prior to admission. CT scan of the abdomen /pelvis showed lumbar spine discitis and osteomyelitis, transferred to [facility] . He was found to be bacteremic with staph aureus with necrosis of the lumbar spine, also in A-fib with RVR, neurology, ID and cardiology were consulted. MRI of the wrist was done that showed fluid collection and surgery was consulted who recommended conservative management. Despite IV antibiotics, he continued to spike fevers.  MRI lumbar spine done on 6/7 showed worsening of infection/concerns of epidural abscess .Initial blood cultures at [facility] grew MSSA. TEE from 6/4 does not show any vegetation but MRI does show small fluid collection.Infectious disease recommended IV nafcillin from negative cultures on 5/28 until 01/10/2023, then switched to cefazolin 2 g IV every 8 hourly for additional 6 weeks, thereafter cefadroxil 1000 mg twice daily (starting 7/24) for at least 10 months.   Due to progressive low back pain radiating down legs, MRI repeated 6/7 showing persistent L4, L5 osteonecrosis, probable L4/5 discitis with right L4-5 septic facet, multiple psoas intramuscular loculated fluid collections. Despite continued IV antibiotics, a repeat MRI 6/18 showing new epidural abscess and stenosis. S/P   left L3-4, L4-5 and L5-S1 laminotomy/foraminotomies for drainage of epidural abscess and decompression of the thecal sac by Dr. Lovell Sheehan on 01/20/2023 . PT/OT recommending SNF on discharge.TOC following.  Assessment & Plan:  Principal Problem:   Discitis of lumbar region Active Problems:   Diabetes mellitus type 2 in nonobese (HCC)    Dyslipidemia   Essential hypertension   New onset atrial fibrillation (HCC)   Sepsis (HCC)   BPH (benign prostatic hyperplasia)   Mood disorder (HCC)   MSSA bacteremia   Malnutrition of moderate degree  Epidural abscess: Presented with acute exacerbation of chronic back pain.  Hospital course was remarkable for fever.  MRI was concerning for epidural abscess.  Initial blood cultures at [facility] showed MSSA, blood culture sent here did not show any growth.  ID was following ,cefazolin 2 g IV every 8 hourly for additional 6 weeks, thereafter cefadroxil 1000 mg twice daily (starting 7/24) for at least 10 months. S/P   left L3-4, L4-5 and L5-S1 laminotomy/foraminotomies for drainage of epidural abscess and decompression of the thecal sac by Dr. Lovell Sheehan on 01/20/2023 . Follow-up wound culture: no growth till date,no organisms seen.  Currently hemodynamically stable, afebrile.  Left wrist edema/concern for tenosynovitis: - MRI shows persistent and worsening abscess of the right wrist, [facility] was consulted, status post bedside aspiration 6/10 but not enough sample.   Planning to continue antibiotics.   Acute kidney injury: Resolved.   Osteonecrosis of L4-L5: - Continue supportive care    New onset paroxysmal atrial fibrillation: - Continue amiodarone, now down to 200mg  daily, remains in NSR. - Eliquis was held for neurosurgery -Currently heart rate is well-controlled   Iron deficiency anemia:  - Continue iron supplements  -Hb stable   Elevate troponin: -Likely demand ischemia.   -No chest pain.     Constipation: Continue bowel regimen,having regular BMs   BPH: -Continue Flomax.   Diabetes mellitus type 2 with hyperglycemia: -A1c of 8.1% - Continue current insulin regimen   Obstructive sleep apnea: -Continue  CPAP at night.   Hyperlipidemia:  - Continue statin   Mood disorder: - Continue lexapro daily, risperdal qHS   Moderate protein caloric malnutrition: -  Supplement with protein.   LLE ACUTE DVT : Bilateral lower extremity duplex showed acute DVT involving left gastrocnemius, left posterior tibial veins.  IR consulted ,sp  IVC filter placement.We will resume Eliquis on 6/27 as per neurosurgery recommendation .  Left lower extremity  remains edematous   Goals of care/disposition: - Patient has already been seen by palliative care.  Currently DNR but continue full treatment. PT/OT recommending SNF on discharge  Nutrition Problem: Moderate Malnutrition Etiology: acute illness Pressure Injury 01/03/23 Buttocks Right;Left Stage 2 -  Partial thickness loss of dermis presenting as a shallow open injury with a red, pink wound bed without slough. 1x1 (Active)  01/03/23 2100  Location: Buttocks  Location Orientation: Right;Left  Staging: Stage 2 -  Partial thickness loss of dermis presenting as a shallow open injury with a red, pink wound bed without slough.  Wound Description (Comments): 1x1  Present on Admission: No  Dressing Type Foam - Lift dressing to assess site every shift 01/21/23 2056     Pressure Injury 01/18/23 Heel Left Stage 1 -  Intact skin with non-blanchable redness of a localized area usually over a bony prominence. (Active)  01/18/23 1337  Location: Heel  Location Orientation: Left  Staging: Stage 1 -  Intact skin with non-blanchable redness of a localized area usually over a bony prominence.  Wound Description (Comments):   Present on Admission:   Dressing Type Foam - Lift dressing to assess site every shift 01/21/23 2056     Pressure Injury 01/19/23 Heel Right Stage 1 -  Intact skin with non-blanchable redness of a localized area usually over a bony prominence. (Active)  01/19/23 1007  Location: Heel  Location Orientation: Right  Staging: Stage 1 -  Intact skin with non-blanchable redness of a localized area usually over a bony prominence.  Wound Description (Comments):   Present on Admission:   Dressing Type Foam - Lift  dressing to assess site every shift 01/21/23 2056    DVT prophylaxis:SCD's Start: 01/20/23 2247 SCDs Start: 12/24/22 1740     Code Status: DNR  Family Communication: Sister at bedside on 6/26  Patient status:Inpatient  Patient is from :Home  Anticipated discharge to:SNF  Estimated DC date:whenever possible   Consultants: Neurosurgery, ID, IR  Procedures: Laminectomies,IVC filter placement  Antimicrobials:  Anti-infectives (From admission, onward)    Start     Dose/Rate Route Frequency Ordered Stop   02/19/23 1000  cefadroxil (DURICEF) capsule 1,000 mg        1,000 mg Oral 2 times daily 01/13/23 1117 11/26/23 0959   01/20/23 2345  ceFAZolin (ANCEF) IVPB 2g/100 mL premix  Status:  Discontinued        2 g 200 mL/hr over 30 Minutes Intravenous Every 8 hours 01/20/23 2246 01/20/23 2249   01/20/23 1630  ceFAZolin (ANCEF) IVPB 2g/100 mL premix        2 g 200 mL/hr over 30 Minutes Intravenous On call to O.R. 01/20/23 1607 01/20/23 1813   01/11/23 0600  ceFAZolin (ANCEF) IVPB 2g/100 mL premix        2 g 200 mL/hr over 30 Minutes Intravenous Every 8 hours 12/31/22 1449 02/18/23 2359   12/28/22 1015  nafcillin 12 g in sodium chloride 0.9 % 500 mL continuous infusion        12 g 20.8 mL/hr over 24  Hours Intravenous Every 24 hours 12/28/22 0925 01/11/23 1245   12/28/22 0830  nafcillin 2 g in sodium chloride 0.9 % 100 mL IVPB  Status:  Discontinued        2 g 216 mL/hr over 30 Minutes Intravenous Every 4 hours 12/28/22 0736 12/28/22 0925   12/26/22 1400  ceFAZolin (ANCEF) IVPB 2g/100 mL premix  Status:  Discontinued        2 g 200 mL/hr over 30 Minutes Intravenous Every 8 hours 12/26/22 0823 12/28/22 0736   12/25/22 2200  vancomycin (VANCOREADY) IVPB 1750 mg/350 mL  Status:  Discontinued        1,750 mg 175 mL/hr over 120 Minutes Intravenous Every 24 hours 12/25/22 0919 12/26/22 0823   12/25/22 1200  cefTRIAXone (ROCEPHIN) 2 g in sodium chloride 0.9 % 100 mL IVPB  Status:   Discontinued        2 g 200 mL/hr over 30 Minutes Intravenous Every 24 hours 12/25/22 0915 12/25/22 0935   12/24/22 2000  ceFEPIme (MAXIPIME) 2 g in sodium chloride 0.9 % 100 mL IVPB  Status:  Discontinued        2 g 200 mL/hr over 30 Minutes Intravenous Every 8 hours 12/24/22 1856 12/25/22 0837   12/24/22 2000  vancomycin (VANCOREADY) IVPB 1750 mg/350 mL  Status:  Discontinued        1,750 mg 175 mL/hr over 120 Minutes Intravenous Every 24 hours 12/24/22 1857 12/25/22 0837   12/24/22 1930  metroNIDAZOLE (FLAGYL) IVPB 500 mg  Status:  Discontinued        500 mg 100 mL/hr over 60 Minutes Intravenous Every 12 hours 12/24/22 1830 12/25/22 0837       Subjective: Patient seen and examined the bedside today.  Hemodynamically stable.  Comfortable.  Complains of some back pain around the surgical site but is not severe.  Having bowel movements.  Sister and brother were at bedside.  Objective: Vitals:   01/21/23 2319 01/22/23 0423 01/22/23 0808 01/22/23 1107  BP: 115/64 123/66 131/73 133/74  Pulse: 92 91 92 100  Resp: (!) 22 19 20 17   Temp: 99.3 F (37.4 C) 99.4 F (37.4 C) 98.2 F (36.8 C) 98.1 F (36.7 C)  TempSrc: Oral Oral Oral Oral  SpO2: 94% 93% 96% 95%  Weight:      Height:        Intake/Output Summary (Last 24 hours) at 01/22/2023 1312 Last data filed at 01/22/2023 0809 Gross per 24 hour  Intake 540 ml  Output 950 ml  Net -410 ml   Filed Weights   01/05/23 0346 01/14/23 1241 01/19/23 0800  Weight: 86.5 kg 91.3 kg 91.3 kg    Examination:  General exam: Overall comfortable, not in distress HEENT: PERRL Respiratory system:  no wheezes or crackles  Cardiovascular system: S1 & S2 heard, RRR.  Gastrointestinal system: Abdomen is nondistended, soft and nontender. Central nervous system: Alert and oriented Extremities: Left lower extremity edema, no clubbing ,no cyanosis Skin: No rashes, no ulcers,no icterus     Data Reviewed: I have personally reviewed following labs  and imaging studies  CBC: Recent Labs  Lab 01/18/23 0525 01/19/23 0512 01/20/23 0744 01/21/23 0440 01/22/23 0520  WBC 9.7 9.5 10.9* 10.6* 10.8*  HGB 7.7* 7.6* 7.9* 8.4* 8.1*  HCT 26.1* 25.8* 26.0* 27.7* 26.6*  MCV 85.9 86.3 87.0 86.0 86.1  PLT 205 217 223 214 205   Basic Metabolic Panel: Recent Labs  Lab 01/16/23 0514 01/18/23 0525 01/19/23 0512 01/21/23 0440 01/22/23  0520  NA 138 138 136 135 136  K 3.5 3.5 3.8 4.3 3.6  CL 104 101 100 101 102  CO2 27 26 26 26 28   GLUCOSE 143* 138* 145* 220* 190*  BUN 18 16 15 13 9   CREATININE 1.29* 1.15 1.07 0.68 0.70  CALCIUM 8.4* 8.7* 8.5* 8.2* 8.1*     Recent Results (from the past 240 hour(s))  Surgical pcr screen     Status: None   Collection Time: 01/20/23  7:19 AM   Specimen: Nasal Mucosa; Nasal Swab  Result Value Ref Range Status   MRSA, PCR NEGATIVE NEGATIVE Final   Staphylococcus aureus NEGATIVE NEGATIVE Final    Comment: (NOTE) The Xpert SA Assay (FDA approved for NASAL specimens in patients 65 years of age and older), is one component of a comprehensive surveillance program. It is not intended to diagnose infection nor to guide or monitor treatment. Performed at [facility], [address], [place], [place] [address]   Aerobic/Anaerobic Culture w Gram Stain (surgical/deep wound)     Status: None (Preliminary result)   Collection Time: 01/20/23  7:01 PM   Specimen: Wound; Abscess  Result Value Ref Range Status   Specimen Description WOUND  Final   Special Requests LUMBAR EPIDURAL SPACE  Final   Gram Stain   Final    FEW WBC PRESENT,BOTH PMN AND MONONUCLEAR NO ORGANISMS SEEN    Culture   Final    NO GROWTH 2 DAYS NO ANAEROBES ISOLATED; CULTURE IN PROGRESS FOR 5 DAYS Performed at [facility], [address], [place], [place] [address]    Report Status PENDING  Incomplete  Aerobic/Anaerobic Culture w Gram Stain (surgical/deep wound)     Status: None (Preliminary result)   Collection Time:  01/20/23  7:27 PM   Specimen: Abscess  Result Value Ref Range Status   Specimen Description ABSCESS  Final   Special Requests EPIDURAL ABSCESS  Final   Gram Stain   Final    RARE WBC PRESENT,BOTH PMN AND MONONUCLEAR NO ORGANISMS SEEN    Culture   Final    NO GROWTH 2 DAYS NO ANAEROBES ISOLATED; CULTURE IN PROGRESS FOR 5 DAYS Performed at [facility], [address], [place], [place] [address]    Report Status PENDING  Incomplete     Radiology Studies: DG Lumbar Spine 1 View  Result Date: 01/20/2023 CLINICAL DATA:  Intraoperative localization image EXAM: LUMBAR SPINE - 1 VIEW COMPARISON:  01/14/2023 FINDINGS: Cross-table lateral view of the lumbar spine was performed. Surgical instrumentation is identified posterior to the L4-5 disc space. IMPRESSION: 1. Localization image, with instrumentation posterior to the L4-5 disc space. Electronically Signed   By: Sharlet Salina M.D.   On: 01/20/2023 20:34    Scheduled Meds:  amiodarone  200 mg Oral Daily   azelastine  1 spray Each Nare BID   [START ON 02/19/2023] cefadroxil  1,000 mg Oral BID   Chlorhexidine Gluconate Cloth  6 each Topical Daily   diclofenac Sodium  2 g Topical QID   docusate sodium  100 mg Oral BID   escitalopram  10 mg Oral Daily   ferrous sulfate  325 mg Oral Q breakfast   fluticasone  2 spray Each Nare Daily   gabapentin  100 mg Oral BID   insulin aspart  0-15 Units Subcutaneous TID WC   insulin aspart  0-5 Units Subcutaneous QHS   insulin glargine-yfgn  15 Units Subcutaneous QHS   ketotifen  1 drop Left Eye BID   lidocaine  2 patch Transdermal Q24H   loratadine  10 mg Oral Daily   LORazepam  0.5 mg Oral Once   melatonin  3 mg Oral QHS   methocarbamol  500 mg Oral BID   multivitamin with minerals  1 tablet Oral Daily   pantoprazole  40 mg Oral BID   risperiDONE  0.5 mg Oral QHS   rosuvastatin  20 mg Oral Daily   senna  2 tablet Oral Daily   sodium chloride flush  10-40 mL Intracatheter Q12H   sodium  chloride flush  3 mL Intravenous Q12H   sodium chloride flush  3 mL Intravenous Q12H   tamsulosin  0.4 mg Oral Daily   Continuous Infusions:  sodium chloride     sodium chloride     sodium chloride Stopped (01/20/23 2250)    ceFAZolin (ANCEF) IV 2 g (01/22/23 0538)     LOS: 29 days   Burnadette Pop, MD [facility] P6/26/2024, 1:12 PM

## 2023-01-22 NOTE — Progress Notes (Signed)
Subjective: The patient is alert and pleasant.  He feels better.  Objective: Vital signs in last 24 hours: Temp:  [98.1 F (36.7 C)-99.4 F (37.4 C)] 98.1 F (36.7 C) (06/26 1107) Pulse Rate:  [91-100] 100 (06/26 1107) Resp:  [13-22] 17 (06/26 1107) BP: (115-133)/(64-74) 133/74 (06/26 1107) SpO2:  [93 %-96 %] 95 % (06/26 1107) Estimated body mass index is 27.3 kg/m as calculated from the following:   Height as of this encounter: 6' (1.829 m).   Weight as of this encounter: 91.3 kg.   Intake/Output from previous day: 06/25 0701 - 06/26 0700 In: 420 [P.O.:420] Out: 851 [Urine:850; Stool:1] Intake/Output this shift: Total I/O In: 240 [P.O.:240] Out: 300 [Urine:300]  Physical exam patient is alert and oriented.  His lower extremity strength is grossly normal.  Lab Results: Recent Labs    01/21/23 0440 01/22/23 0520  WBC 10.6* 10.8*  HGB 8.4* 8.1*  HCT 27.7* 26.6*  PLT 214 205   BMET Recent Labs    01/21/23 0440 01/22/23 0520  NA 135 136  K 4.3 3.6  CL 101 102  CO2 26 28  GLUCOSE 220* 190*  BUN 13 9  CREATININE 0.68 0.70  CALCIUM 8.2* 8.1*    Studies/Results: DG Lumbar Spine 1 View  Result Date: 01/20/2023 CLINICAL DATA:  Intraoperative localization image EXAM: LUMBAR SPINE - 1 VIEW COMPARISON:  01/14/2023 FINDINGS: Cross-table lateral view of the lumbar spine was performed. Surgical instrumentation is identified posterior to the L4-5 disc space. IMPRESSION: 1. Localization image, with instrumentation posterior to the L4-5 disc space. Electronically Signed   By: Sharlet Salina M.D.   On: 01/20/2023 20:34    Assessment/Plan: Postop day #2: The patient is doing well.  Hopefully he will respond to the antibiotics to clear his infections.  It looks like he will need rehab.  LOS: 29 days     Cristi Loron 01/22/2023, 1:38 PM     Patient ID: Jim Ramirez, male   DOB: 27-Sep-1950, 72 y.o.   MRN: [id number]

## 2023-01-22 NOTE — Progress Notes (Signed)
This chaplain is present for F/U spiritual care post surgery.   The chaplain listened reflectively as the Pt. shared his determination to meet  his goal of returning to hiking. The Pt. reflects on "the possibility of his body not keeping up with his mind, but it doesn't worry him at this time."    The Pt. and chaplain agreed on regular spiritual care visits with the knowledge the Pt. may be moved to another unit.  Chaplain Stephanie Acre [phone]

## 2023-01-22 NOTE — [place]2 (Signed)
[place] MEDICAID FL2 LEVEL OF CARE FORM     IDENTIFICATION  Patient Name: Jim Ramirez Birthdate: [birth year] Sex: male Admission Date (Current Location): 12/24/2022  [facility] and [place] Number:  Producer, television/film/video and Address:  [facility], [address] [place], [place] [address]      Provider Number: [id number]  Attending Physician Name and Address:  Burnadette Pop, MD  Relative Name and Phone Number:       Current Level of Care: Hospital Recommended Level of Care: Skilled Nursing Facility Prior Approval Number:    Date Approved/Denied:   PASRR Number: [id number]  Discharge Plan: SNF    Current Diagnoses: Patient Active Problem List   Diagnosis Date Noted   Malnutrition of moderate degree 01/14/2023   MSSA bacteremia 12/26/2022   New onset atrial fibrillation (HCC) 12/24/2022   Discitis of lumbar region 12/24/2022   Sepsis (HCC) 12/24/2022   BPH (benign prostatic hyperplasia) 12/24/2022   Mood disorder (HCC) 12/24/2022   Cervical spondylosis with myelopathy and radiculopathy 05/22/2022   Diabetes mellitus type 2 in nonobese (HCC) 02/26/2008   Dyslipidemia 02/26/2008   Essential hypertension 02/26/2008   INTERNAL HEMORRHOIDS 02/26/2008   EXTERNAL HEMORRHOIDS 02/26/2008   DUODENAL ULCER 02/26/2008   GASTRITIS 02/26/2008   DUODENITIS 02/26/2008   MELENA 02/26/2008   NECK PAIN 02/26/2008   INSOMNIA 02/26/2008   SLEEP APNEA 02/26/2008   ABDOMINAL CRAMPS 02/26/2008   RECTAL BLEEDING, HX OF 02/26/2008    Orientation RESPIRATION BLADDER Height & Weight     Self, Time, Situation, Place  Normal Continent Weight: 201 lb 4.5 oz (91.3 kg) Height:  6' (182.9 cm)  BEHAVIORAL SYMPTOMS/MOOD NEUROLOGICAL BOWEL NUTRITION STATUS      Continent Diet (See dc summary)  AMBULATORY STATUS COMMUNICATION OF NEEDS Skin     Verbally Normal                       Personal Care Assistance Level of Assistance              Functional  Limitations Info  Sight, Hearing, Speech Sight Info:  (recent surgery in left eye) Hearing Info: Adequate Speech Info: Adequate    SPECIAL CARE FACTORS FREQUENCY  PT (By licensed PT), OT (By licensed OT)     PT Frequency: 5xweek OT Frequency: 5xweek            Contractures Contractures Info: Not present    Additional Factors Info  Code Status, Allergies Code Status Info: DNR Allergies Info: NKA           Current Medications (01/22/2023):  This is the current hospital active medication list Current Facility-Administered Medications  Medication Dose Route Frequency Provider Last Rate Last Admin   0.9 %  sodium chloride infusion  10 mL/hr Intravenous Once Tressie Stalker, MD       0.9 %  sodium chloride infusion  10 mL/hr Intravenous Once Tressie Stalker, MD       0.9 %  sodium chloride infusion  250 mL Intravenous Continuous Tressie Stalker, MD   Stopped at 01/20/23 2250   acetaminophen (TYLENOL) tablet 650 mg  650 mg Oral Q6H PRN Tressie Stalker, MD   650 mg at 12/26/22 2155   Or   acetaminophen (TYLENOL) suppository 650 mg  650 mg Rectal Q6H PRN Tressie Stalker, MD       acetaminophen (TYLENOL) tablet 650 mg  650 mg Oral Q4H PRN Tressie Stalker, MD  Or   acetaminophen (TYLENOL) suppository 650 mg  650 mg Rectal Q4H PRN Tressie Stalker, MD       amiodarone (PACERONE) tablet 200 mg  200 mg Oral Daily Tressie Stalker, MD   200 mg at 01/22/23 0924   azelastine (ASTELIN) 0.1 % nasal spray 1 spray  1 spray Each Nare BID Tressie Stalker, MD   1 spray at 01/22/23 1610   bisacodyl (DULCOLAX) suppository 10 mg  10 mg Rectal Daily PRN Tressie Stalker, MD       [START ON 02/19/2023] cefadroxil (DURICEF) capsule 1,000 mg  1,000 mg Oral BID Tressie Stalker, MD       ceFAZolin (ANCEF) IVPB 2g/100 mL premix  2 g Intravenous Q8H Tressie Stalker, MD 200 mL/hr at 01/22/23 1507 2 g at 01/22/23 1507   Chlorhexidine Gluconate Cloth 2 % PADS 6 each  6 each Topical Daily Tressie Stalker, MD   6 each at 01/22/23 0926   cyclobenzaprine (FLEXERIL) tablet 10 mg  10 mg Oral TID PRN Tressie Stalker, MD   10 mg at 01/22/23 1218   diclofenac Sodium (VOLTAREN) 1 % topical gel 2 g  2 g Topical QID Tressie Stalker, MD   2 g at 01/22/23 1507   docusate sodium (COLACE) capsule 100 mg  100 mg Oral BID Tressie Stalker, MD   100 mg at 01/22/23 0925   escitalopram (LEXAPRO) tablet 10 mg  10 mg Oral Daily Tressie Stalker, MD   10 mg at 01/22/23 9604   ferrous sulfate tablet 325 mg  325 mg Oral Q breakfast Tressie Stalker, MD   325 mg at 01/22/23 0924   fluticasone (FLONASE) 50 MCG/ACT nasal spray 2 spray  2 spray Each Nare Daily Tressie Stalker, MD   2 spray at 01/22/23 5409   gabapentin (NEURONTIN) capsule 100 mg  100 mg Oral BID Tressie Stalker, MD   100 mg at 01/22/23 8119   Gerhardt's butt cream   Topical Daily PRN Tressie Stalker, MD       guaiFENesin (ROBITUSSIN) 100 MG/5ML liquid 5 mL  5 mL Oral Q4H PRN Tressie Stalker, MD       hydrALAZINE (APRESOLINE) injection 10 mg  10 mg Intravenous Q4H PRN Tressie Stalker, MD       insulin aspart (novoLOG) injection 0-15 Units  0-15 Units Subcutaneous TID WC Tressie Stalker, MD   5 Units at 01/22/23 1221   insulin aspart (novoLOG) injection 0-5 Units  0-5 Units Subcutaneous QHS Tressie Stalker, MD   2 Units at 01/21/23 2125   insulin glargine-yfgn (SEMGLEE) injection 15 Units  15 Units Subcutaneous QHS Tressie Stalker, MD   15 Units at 01/21/23 2114   ketotifen (ZADITOR) 0.035 % ophthalmic solution 1 drop  1 drop Left Eye BID Tressie Stalker, MD   1 drop at 01/22/23 0926   levalbuterol (XOPENEX) nebulizer solution 0.63 mg  0.63 mg Nebulization Q6H PRN Tressie Stalker, MD       lidocaine (LIDODERM) 5 % 2 patch  2 patch Transdermal Q24H Tressie Stalker, MD   2 patch at 01/22/23 0934   loratadine (CLARITIN) tablet 10 mg  10 mg Oral Daily Tressie Stalker, MD   10 mg at 01/22/23 1478   LORazepam (ATIVAN) tablet 0.5 mg  0.5 mg Oral  Once Tressie Stalker, MD       melatonin tablet 3 mg  3 mg Oral QHS Tressie Stalker, MD   3 mg at 01/21/23 2109   menthol-cetylpyridinium (CEPACOL) lozenge 3 mg  1 lozenge  Oral PRN Tressie Stalker, MD       Or   phenol [facility]) mouth spray 1 spray  1 spray Mouth/Throat PRN Tressie Stalker, MD       methocarbamol (ROBAXIN) tablet 500 mg  500 mg Oral BID Tressie Stalker, MD   500 mg at 01/22/23 4034   metoprolol tartrate (LOPRESSOR) injection 5 mg  5 mg Intravenous Q4H PRN Tressie Stalker, MD       mineral oil-hydrophilic petrolatum (AQUAPHOR) ointment   Topical BID PRN Tressie Stalker, MD       morphine (PF) 2 MG/ML injection 2 mg  2 mg Intravenous Q2H PRN Tressie Stalker, MD   2 mg at 01/22/23 0535   morphine (PF) 2 MG/ML injection 4 mg  4 mg Intravenous Q2H PRN Tressie Stalker, MD       multivitamin with minerals tablet 1 tablet  1 tablet Oral Daily Tressie Stalker, MD   1 tablet at 01/22/23 0924   ondansetron (ZOFRAN) tablet 4 mg  4 mg Oral Q6H PRN Tressie Stalker, MD       Or   ondansetron [facility]) injection 4 mg  4 mg Intravenous Q6H PRN Tressie Stalker, MD       ondansetron [facility]) tablet 4 mg  4 mg Oral Q6H PRN Tressie Stalker, MD       Or   ondansetron [facility]) injection 4 mg  4 mg Intravenous Q6H PRN Tressie Stalker, MD       Oral care mouth rinse  15 mL Mouth Rinse PRN Tressie Stalker, MD       oxyCODONE (Oxy IR/ROXICODONE) immediate release tablet 10 mg  10 mg Oral Q3H PRN Tressie Stalker, MD       oxyCODONE (Oxy IR/ROXICODONE) immediate release tablet 5 mg  5 mg Oral Q4H PRN Tressie Stalker, MD   5 mg at 01/21/23 0431   oxyCODONE (Oxy IR/ROXICODONE) immediate release tablet 5 mg  5 mg Oral Q3H PRN Tressie Stalker, MD   5 mg at 01/22/23 1218   pantoprazole (PROTONIX) EC tablet 40 mg  40 mg Oral BID Tressie Stalker, MD   40 mg at 01/22/23 7425   risperiDONE (RISPERDAL) tablet 0.5 mg  0.5 mg Oral QHS Tressie Stalker, MD   0.5 mg at 01/21/23 2110    rosuvastatin (CRESTOR) tablet 20 mg  20 mg Oral Daily Tressie Stalker, MD   20 mg at 01/22/23 9563   senna (SENOKOT) tablet 17.2 mg  2 tablet Oral Daily Tressie Stalker, MD   17.2 mg at 01/22/23 0924   sodium chloride flush (NS) 0.9 % injection 10-40 mL  10-40 mL Intracatheter Q12H Tressie Stalker, MD   10 mL at 01/22/23 0927   sodium chloride flush (NS) 0.9 % injection 10-40 mL  10-40 mL Intracatheter PRN Tressie Stalker, MD       sodium chloride flush (NS) 0.9 % injection 3 mL  3 mL Intravenous Q12H Tressie Stalker, MD   3 mL at 01/21/23 0927   sodium chloride flush (NS) 0.9 % injection 3 mL  3 mL Intravenous Q12H Tressie Stalker, MD   3 mL at 01/22/23 0927   sodium chloride flush (NS) 0.9 % injection 3 mL  3 mL Intravenous PRN Tressie Stalker, MD       tamsulosin [facility]) capsule 0.4 mg  0.4 mg Oral Daily Tressie Stalker, MD   0.4 mg at 01/22/23 0925   zolpidem (AMBIEN) tablet 5 mg  5 mg Oral QHS PRN Tressie Stalker, MD   5 mg  at 01/14/23 2239     Discharge Medications: Please see discharge summary for a list of discharge medications.  Relevant Imaging Results:  Relevant Lab Results:   Additional Information SSN: [id number]  Oletta Lamas, MSW, [facility]  Clinical Social Worker I

## 2023-01-23 ENCOUNTER — Telehealth: Payer: Medicare HMO | Admitting: Internal Medicine

## 2023-01-23 DIAGNOSIS — M4646 Discitis, unspecified, lumbar region: Secondary | ICD-10-CM | POA: Diagnosis not present

## 2023-01-23 LAB — GLUCOSE, CAPILLARY
Glucose-Capillary: 181 mg/dL — ABNORMAL HIGH (ref 70–99)
Glucose-Capillary: 357 mg/dL — ABNORMAL HIGH (ref 70–99)
Glucose-Capillary: 178 mg/dL — ABNORMAL HIGH (ref 70–99)
Glucose-Capillary: 234 mg/dL — ABNORMAL HIGH (ref 70–99)

## 2023-01-23 LAB — CBC
HCT: 26.3 % — ABNORMAL LOW (ref 39.0–52.0)
Hemoglobin: 8.1 g/dL — ABNORMAL LOW (ref 13.0–17.0)
MCH: 27.4 pg (ref 26.0–34.0)
MCHC: 30.8 g/dL (ref 30.0–36.0)
MCV: 88.9 fL (ref 80.0–100.0)
Platelets: 160 10*3/uL (ref 150–400)
RBC: 2.96 MIL/uL — ABNORMAL LOW (ref 4.22–5.81)
RDW: 19.4 % — ABNORMAL HIGH (ref 11.5–15.5)
WBC: 8.7 10*3/uL (ref 4.0–10.5)
nRBC: 0 % (ref 0.0–0.2)

## 2023-01-23 LAB — AEROBIC/ANAEROBIC CULTURE W GRAM STAIN (SURGICAL/DEEP WOUND)

## 2023-01-23 LAB — BRAIN NATRIURETIC PEPTIDE: B Natriuretic Peptide: 27 pg/mL (ref 0.0–100.0)

## 2023-01-23 MED ORDER — METOPROLOL TARTRATE 12.5 MG HALF TABLET
12.5000 mg | ORAL_TABLET | Freq: Two times a day (BID) | ORAL | Status: DC
  Administered 2023-01-23 – 2023-01-28 (×10): 12.5 mg via ORAL
  Filled 2023-01-23 (×11): qty 1

## 2023-01-23 MED ORDER — APIXABAN 5 MG PO TABS
5.0000 mg | ORAL_TABLET | Freq: Two times a day (BID) | ORAL | Status: DC
  Administered 2023-01-23 – 2023-01-28 (×11): 5 mg via ORAL
  Filled 2023-01-23 (×11): qty 1

## 2023-01-23 NOTE — Progress Notes (Signed)
   Providing Compassionate, Quality Care - Together   Subjective: Patient reports his back is sore today. He just got back to bed after sitting up for about two hours. He is happy with his progress. The nurse reports a moderate amount of older sanguinous drainage. She reinforced the dressing and it has not drained further.  Objective: Vital signs in last 24 hours: Temp:  [98 F (36.7 C)-98.3 F (36.8 C)] 98.2 F (36.8 C) (06/27 0714) Pulse Rate:  [91-109] 101 (06/27 0714) Resp:  [14-18] 17 (06/27 0714) BP: (116-146)/(66-73) 119/66 (06/27 0714) SpO2:  [94 %-98 %] 94 % (06/27 0714)  Intake/Output from previous day: 06/26 0701 - 06/27 0700 In: 520 [P.O.:520] Out: 1050 [Urine:1050] Intake/Output this shift: No intake/output data recorded.  Alert and oriented x 4 PERRLA CN II-XII grossly intact MAE, mild generalized weakness Incision is covered with Honeycomb and gauze dressings; Dressing is intact with a moderate amount of darker blood. Not actively draining   Lab Results: Recent Labs    01/22/23 0520 01/23/23 0515  WBC 10.8* 8.7  HGB 8.1* 8.1*  HCT 26.6* 26.3*  PLT 205 160   BMET Recent Labs    01/21/23 0440 01/22/23 0520  NA 135 136  K 4.3 3.6  CL 101 102  CO2 26 28  GLUCOSE 220* 190*  BUN 13 9  CREATININE 0.68 0.70  CALCIUM 8.2* 8.1*    Studies/Results: No results found.  Assessment/Plan: Patient admitted with significant back pain with radiation down BLE. He was septic with elevated troponin and a-fib with RVR. 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 Patient underwent left L3-4, L4-5 and L5-S1 laminotomy/foraminotomies for drainage of epidural abscess and decompression of the thecal sac by Dr. Lovell Sheehan on 01/20/2023.   LOS: 30 days   -Mobilize with therapies -Continue abx per ID -Keep  incision dry. Change dressing as needed to ensure dressing is dry.   Val Eagle, DNP, AGNP-C Nurse Practitioner  [facility] [address], [place], [place] [address] P: [phone]    F: [phone]  01/23/2023, 11:19 AM

## 2023-01-23 NOTE — Progress Notes (Signed)
PROGRESS NOTE  Jim Ramirez  ZOX:[id number] DOB: [birth year] DOA: 12/24/2022 PCP: [name], MD   Brief Narrative:  Patient is a 72 year old male with history of  hypertension and diabetes who initially presented to [facility] on 12/22/2022 for chronic back pain radiating both her legs, with some shortness of breath that started 1 week prior to admission. CT scan of the abdomen /pelvis showed lumbar spine discitis and osteomyelitis, transferred to [facility] . He was found to be bacteremic with staph aureus with necrosis of the lumbar spine, also in A-fib with RVR, neurology, ID and cardiology were consulted. MRI of the wrist was done that showed fluid collection and surgery was consulted who recommended conservative management. Despite IV antibiotics, he continued to spike fevers.  MRI lumbar spine done on 6/7 showed worsening of infection/concerns of epidural abscess .Initial blood cultures at [facility] grew MSSA. TEE from 6/4 does not show any vegetation but MRI does show small fluid collection.Infectious disease recommended IV nafcillin from negative cultures on 5/28 until 01/10/2023, then switched to cefazolin 2 g IV every 8 hourly for additional 6 weeks, thereafter cefadroxil 1000 mg twice daily (starting 7/24) for at least 10 months.   Due to progressive low back pain radiating down legs, MRI repeated 6/7 showing persistent L4, L5 osteonecrosis, probable L4/5 discitis with right L4-5 septic facet, multiple psoas intramuscular loculated fluid collections. Despite continued IV antibiotics, a repeat MRI 6/18 showing new epidural abscess and stenosis. S/P   left L3-4, L4-5 and L5-S1 laminotomy/foraminotomies for drainage of epidural abscess and decompression of the thecal sac by Dr. Lovell Sheehan on 01/20/2023 . PT/OT recommending SNF on discharge.TOC following.  Assessment & Plan:  Principal Problem:   Discitis of lumbar region Active Problems:   Diabetes mellitus type 2 in nonobese (HCC)    Dyslipidemia   Essential hypertension   New onset atrial fibrillation (HCC)   Sepsis (HCC)   BPH (benign prostatic hyperplasia)   Mood disorder (HCC)   MSSA bacteremia   Malnutrition of moderate degree  Epidural abscess: Presented with acute exacerbation of chronic back pain.  Hospital course was remarkable for fever.  MRI was concerning for epidural abscess.  Initial blood cultures at [facility] showed MSSA, blood culture sent here did not show any growth.  ID was following ,cefazolin 2 g IV every 8 hourly for additional 6 weeks, thereafter cefadroxil 1000 mg twice daily (starting 7/24) for at least 10 months. S/P   left L3-4, L4-5 and L5-S1 laminotomy/foraminotomies for drainage of epidural abscess and decompression of the thecal sac by Dr. Lovell Sheehan on 01/20/2023 . Follow-up wound culture: no growth till date,no organisms seen.  Currently hemodynamically stable, afebrile.  Left wrist edema/concern for tenosynovitis: - MRI shows persistent and worsening abscess of the right wrist, [facility] was consulted, status post bedside aspiration 6/10 but not enough sample.   Planning to continue antibiotics.   Acute kidney injury: Resolved.   Osteonecrosis of L4-L5: - Continue supportive care    New onset paroxysmal atrial fibrillation: - Continue amiodarone, now down to 200mg  daily, remains in NSR. - Eliquis was held for neurosurgery,resumed on 6/27 -EKG monitor on 6/27 showed sinus tach   Iron deficiency anemia:  - Continue iron supplements  -Hb stable   Elevate troponin: -Likely demand ischemia.   -No chest pain.     Constipation: Continue bowel regimen,having regular BMs   BPH: -Continue Flomax.   Diabetes mellitus type 2 with hyperglycemia: -A1c of 8.1% - Continue current insulin regimen  Obstructive sleep apnea: -Continue CPAP at night.   Hyperlipidemia:  - Continue statin   Mood disorder: - Continue lexapro daily, risperdal qHS   Moderate protein caloric  malnutrition: - Supplement with protein.   LLE ACUTE DVT : Bilateral lower extremity duplex showed acute DVT involving left gastrocnemius, left posterior tibial veins.  IR consulted ,sp  IVC filter placement.We will resume Eliquis on 6/27 as per neurosurgery recommendation .  Has bilateral lower EXTR edema, checking BNP   Goals of care/disposition: - Patient has already been seen by palliative care.  Currently DNR but continue full treatment. PT/OT recommending SNF on discharge  Nutrition Problem: Moderate Malnutrition Etiology: acute illness Pressure Injury 01/03/23 Buttocks Right;Left Stage 2 -  Partial thickness loss of dermis presenting as a shallow open injury with a red, pink wound bed without slough. 1x1 (Active)  01/03/23 2100  Location: Buttocks  Location Orientation: Right;Left  Staging: Stage 2 -  Partial thickness loss of dermis presenting as a shallow open injury with a red, pink wound bed without slough.  Wound Description (Comments): 1x1  Present on Admission: No  Dressing Type Foam - Lift dressing to assess site every shift 01/21/23 2056     Pressure Injury 01/18/23 Heel Left Stage 1 -  Intact skin with non-blanchable redness of a localized area usually over a bony prominence. (Active)  01/18/23 1337  Location: Heel  Location Orientation: Left  Staging: Stage 1 -  Intact skin with non-blanchable redness of a localized area usually over a bony prominence.  Wound Description (Comments):   Present on Admission:   Dressing Type Foam - Lift dressing to assess site every shift 01/21/23 2056     Pressure Injury 01/19/23 Heel Right Stage 1 -  Intact skin with non-blanchable redness of a localized area usually over a bony prominence. (Active)  01/19/23 1007  Location: Heel  Location Orientation: Right  Staging: Stage 1 -  Intact skin with non-blanchable redness of a localized area usually over a bony prominence.  Wound Description (Comments):   Present on Admission:    Dressing Type Foam - Lift dressing to assess site every shift 01/21/23 2056    DVT prophylaxis:SCD's Start: 01/20/23 2247 SCDs Start: 12/24/22 1740     Code Status: DNR  Family Communication: Sister at bedside on 6/26  Patient status:Inpatient  Patient is from :Home  Anticipated discharge to:SNF  Estimated DC date:whenever possible   Consultants: Neurosurgery, ID, IR  Procedures: Laminectomies,IVC filter placement  Antimicrobials:  Anti-infectives (From admission, onward)    Start     Dose/Rate Route Frequency Ordered Stop   02/19/23 1000  cefadroxil (DURICEF) capsule 1,000 mg        1,000 mg Oral 2 times daily 01/13/23 1117 11/26/23 0959   01/20/23 2345  ceFAZolin (ANCEF) IVPB 2g/100 mL premix  Status:  Discontinued        2 g 200 mL/hr over 30 Minutes Intravenous Every 8 hours 01/20/23 2246 01/20/23 2249   01/20/23 1630  ceFAZolin (ANCEF) IVPB 2g/100 mL premix        2 g 200 mL/hr over 30 Minutes Intravenous On call to O.R. 01/20/23 1607 01/20/23 1813   01/11/23 0600  ceFAZolin (ANCEF) IVPB 2g/100 mL premix        2 g 200 mL/hr over 30 Minutes Intravenous Every 8 hours 12/31/22 1449 02/18/23 2359   12/28/22 1015  nafcillin 12 g in sodium chloride 0.9 % 500 mL continuous infusion        12  g 20.8 mL/hr over 24 Hours Intravenous Every 24 hours 12/28/22 0925 01/11/23 1245   12/28/22 0830  nafcillin 2 g in sodium chloride 0.9 % 100 mL IVPB  Status:  Discontinued        2 g 216 mL/hr over 30 Minutes Intravenous Every 4 hours 12/28/22 0736 12/28/22 0925   12/26/22 1400  ceFAZolin (ANCEF) IVPB 2g/100 mL premix  Status:  Discontinued        2 g 200 mL/hr over 30 Minutes Intravenous Every 8 hours 12/26/22 0823 12/28/22 0736   12/25/22 2200  vancomycin (VANCOREADY) IVPB 1750 mg/350 mL  Status:  Discontinued        1,750 mg 175 mL/hr over 120 Minutes Intravenous Every 24 hours 12/25/22 0919 12/26/22 0823   12/25/22 1200  cefTRIAXone (ROCEPHIN) 2 g in sodium chloride 0.9 %  100 mL IVPB  Status:  Discontinued        2 g 200 mL/hr over 30 Minutes Intravenous Every 24 hours 12/25/22 0915 12/25/22 0935   12/24/22 2000  ceFEPIme (MAXIPIME) 2 g in sodium chloride 0.9 % 100 mL IVPB  Status:  Discontinued        2 g 200 mL/hr over 30 Minutes Intravenous Every 8 hours 12/24/22 1856 12/25/22 0837   12/24/22 2000  vancomycin (VANCOREADY) IVPB 1750 mg/350 mL  Status:  Discontinued        1,750 mg 175 mL/hr over 120 Minutes Intravenous Every 24 hours 12/24/22 1857 12/25/22 0837   12/24/22 1930  metroNIDAZOLE (FLAGYL) IVPB 500 mg  Status:  Discontinued        500 mg 100 mL/hr over 60 Minutes Intravenous Every 12 hours 12/24/22 1830 12/25/22 0837       Subjective: Patient seen and examined at bedside today.  Hemodynamically stable.  Remains comfortable.  Sitting on the chair.  Back pain is well-controlled.  EKG monitor showed sinus tachycardia.   Objective: Vitals:   01/22/23 1941 01/22/23 2320 01/23/23 0435 01/23/23 0714  BP: (!) 146/72 128/71 121/73 119/66  Pulse: (!) 103 (!) 109 91 (!) 101  Resp: 17 16 14 17   Temp: 98.3 F (36.8 C) 98.2 F (36.8 C) 98 F (36.7 C) 98.2 F (36.8 C)  TempSrc: Oral Oral Oral Oral  SpO2: 95% 98% 95% 94%  Weight:      Height:        Intake/Output Summary (Last 24 hours) at 01/23/2023 1036 Last data filed at 01/22/2023 2300 Gross per 24 hour  Intake 280 ml  Output 750 ml  Net -470 ml   Filed Weights   01/05/23 0346 01/14/23 1241 01/19/23 0800  Weight: 86.5 kg 91.3 kg 91.3 kg    Examination:   General exam: Overall comfortable, not in distress HEENT: PERRL Respiratory system:  no wheezes or crackles  Cardiovascular system: Sinus tach.  Gastrointestinal system: Abdomen is nondistended, soft and nontender. Central nervous system: Alert and oriented Extremities: LE pitting  edema, no clubbing ,no cyanosis Skin: No rashes, no ulcers,no icterus    Data Reviewed: I have personally reviewed following labs and imaging  studies  CBC: Recent Labs  Lab 01/19/23 0512 01/20/23 0744 01/21/23 0440 01/22/23 0520 01/23/23 0515  WBC 9.5 10.9* 10.6* 10.8* 8.7  HGB 7.6* 7.9* 8.4* 8.1* 8.1*  HCT 25.8* 26.0* 27.7* 26.6* 26.3*  MCV 86.3 87.0 86.0 86.1 88.9  PLT 217 223 214 205 160   Basic Metabolic Panel: Recent Labs  Lab 01/18/23 0525 01/19/23 0512 01/21/23 0440 01/22/23 0520  NA 138  136 135 136  K 3.5 3.8 4.3 3.6  CL 101 100 101 102  CO2 26 26 26 28   GLUCOSE 138* 145* 220* 190*  BUN 16 15 13 9   CREATININE 1.15 1.07 0.68 0.70  CALCIUM 8.7* 8.5* 8.2* 8.1*     Recent Results (from the past 240 hour(s))  Surgical pcr screen     Status: None   Collection Time: 01/20/23  7:19 AM   Specimen: Nasal Mucosa; Nasal Swab  Result Value Ref Range Status   MRSA, PCR NEGATIVE NEGATIVE Final   Staphylococcus aureus NEGATIVE NEGATIVE Final    Comment: (NOTE) The Xpert SA Assay (FDA approved for NASAL specimens in patients 25 years of age and older), is one component of a comprehensive surveillance program. It is not intended to diagnose infection nor to guide or monitor treatment. Performed at [facility], [address] [place], [place] [address]   Aerobic/Anaerobic Culture w Gram Stain (surgical/deep wound)     Status: None (Preliminary result)   Collection Time: 01/20/23  7:01 PM   Specimen: Wound; Abscess  Result Value Ref Range Status   Specimen Description WOUND  Final   Special Requests LUMBAR EPIDURAL SPACE  Final   Gram Stain   Final    FEW WBC PRESENT,BOTH PMN AND MONONUCLEAR NO ORGANISMS SEEN    Culture   Final    NO GROWTH 3 DAYS NO ANAEROBES ISOLATED; CULTURE IN PROGRESS FOR 5 DAYS Performed at [facility], [address], [place], [place] [address]    Report Status PENDING  Incomplete  Aerobic/Anaerobic Culture w Gram Stain (surgical/deep wound)     Status: None (Preliminary result)   Collection Time: 01/20/23  7:27 PM   Specimen: Abscess  Result Value Ref Range  Status   Specimen Description ABSCESS  Final   Special Requests EPIDURAL ABSCESS  Final   Gram Stain   Final    RARE WBC PRESENT,BOTH PMN AND MONONUCLEAR NO ORGANISMS SEEN    Culture   Final    NO GROWTH 3 DAYS NO ANAEROBES ISOLATED; CULTURE IN PROGRESS FOR 5 DAYS Performed at [facility], [address], [place], [place] [address]    Report Status PENDING  Incomplete     Radiology Studies: No results found.  Scheduled Meds:  amiodarone  200 mg Oral Daily   azelastine  1 spray Each Nare BID   [START ON 02/19/2023] cefadroxil  1,000 mg Oral BID   Chlorhexidine Gluconate Cloth  6 each Topical Daily   diclofenac Sodium  2 g Topical QID   docusate sodium  100 mg Oral BID   escitalopram  10 mg Oral Daily   ferrous sulfate  325 mg Oral Q breakfast   fluticasone  2 spray Each Nare Daily   gabapentin  100 mg Oral BID   insulin aspart  0-15 Units Subcutaneous TID WC   insulin aspart  0-5 Units Subcutaneous QHS   insulin glargine-yfgn  15 Units Subcutaneous QHS   ketotifen  1 drop Left Eye BID   lidocaine  2 patch Transdermal Q24H   loratadine  10 mg Oral Daily   LORazepam  0.5 mg Oral Once   melatonin  3 mg Oral QHS   methocarbamol  500 mg Oral BID   multivitamin with minerals  1 tablet Oral Daily   pantoprazole  40 mg Oral BID   risperiDONE  0.5 mg Oral QHS   rosuvastatin  20 mg Oral Daily  senna  2 tablet Oral Daily   sodium chloride flush  10-40 mL Intracatheter Q12H   sodium chloride flush  3 mL Intravenous Q12H   sodium chloride flush  3 mL Intravenous Q12H   tamsulosin  0.4 mg Oral Daily   Continuous Infusions:  sodium chloride     sodium chloride     sodium chloride Stopped (01/20/23 2250)    ceFAZolin (ANCEF) IV 2 g (01/23/23 0515)     LOS: 30 days   Burnadette Pop, MD [facility] P6/27/2024, 10:36 AM

## 2023-01-23 NOTE — Inpatient Diabetes Management (Signed)
Inpatient Diabetes Program Recommendations  AACE/ADA: New Consensus Statement on Inpatient Glycemic Control (2015)  Target Ranges:  Prepandial:   less than 140 mg/dL      Peak postprandial:   less than 180 mg/dL (1-2 hours)      Critically ill patients:  140 - 180 mg/dL   Lab Results  Component Value Date   GLUCAP 357 (H) 01/23/2023   HGBA1C 8.5 (H) 12/24/2022    Review of Glycemic Control  Latest Reference Range & Units 01/22/23 05:54 01/22/23 11:05 01/22/23 15:41 01/22/23 21:14 01/23/23 06:12 01/23/23 11:25  Glucose-Capillary 70 - 99 mg/dL 621 (H) 308 (H) 657 (H) 143 (H) 181 (H) 357 (H)   Diabetes history: DM 2 Outpatient Diabetes medications: Jardiance 25 mg Daily, 70/30 50 units qam, 20 units qpm, Metformin 1000 mg bid Current orders for Inpatient glycemic control:  Novolog 0-15 units tid + hs Semglee 15 units qhs  Inpatient Diabetes Program Recommendations:    Pt with Poor po intake however with a large postprandial spike in the 300 range.  -  may need to add Novolog 3 units tid meal coverage insulin, however, with parameters (given if glucose is at least 80 mg/dl and pt consumes at least 50% of meals).  Thanks,  Christena Deem RN, MSN, BC-ADM Inpatient Diabetes Coordinator Team Pager [phone] (8a-5p)

## 2023-01-23 NOTE — Progress Notes (Signed)
Physical Therapy Treatment Patient Details Name: Jim Ramirez MRN: [id number] DOB: 04/16/1951 Today's Date: 01/23/2023   History of Present Illness 72 yo male admitted 5/28 with back pain with osteonecrosis of lumbar spine L4-5. Rt wrist edema with tenosynovitis, new Afib with RVR. MRI brain negative for acute infarct, but with pyogenic ventriculitis. Bil UE superficial thrombosis. LE Doppler 6/22 positive for acute LLE DVT. 6/23 IR placement of IVC filter. 6/24 L3-S1 laminotomy and decompression. PMhx: ACDF, HTN, T2DM    PT Comments    Pt pleasant and able to progress gait distance with heavy reliance on RW and close chair follow. Max HR 140 with activity with return to 104bpm at rest. Pt continues to need assist with all transfers and mobility but progressing  and remains highly motivated. Encouraged OOB to chair for meals.    Recommendations for follow up therapy are one component of a multi-disciplinary discharge planning process, led by the attending physician.  Recommendations may be updated based on patient status, additional functional criteria and insurance authorization.  Follow Up Recommendations  Can patient physically be transported by private vehicle: No    Assistance Recommended at Discharge Frequent or constant Supervision/Assistance  Patient can return home with the following Assistance with cooking/housework;Assistance with feeding;Help with stairs or ramp for entrance;Assist for transportation;Direct supervision/assist for financial management;A lot of help with bathing/dressing/bathroom;Two people to help with walking and/or transfers   Equipment Recommendations  Hospital bed;Wheelchair (measurements PT);Wheelchair cushion (measurements PT);BSC/3in1;Rolling walker (2 wheels)    Recommendations for Other Services       Precautions / Restrictions Precautions Precautions: Back;Fall Precaution Comments: educate for precautions, max assist to don brace Spinal Brace:  Lumbar corset;Applied in sitting position     Mobility  Bed Mobility Overal bed mobility: Needs Assistance Bed Mobility: Rolling, Sidelying to Sit Rolling: Min assist Sidelying to sit: Min assist       General bed mobility comments: cues for sequence with physical assist to fully roll and elevate trunk. assist to achieve midline sitting as pt with tendency for left lean    Transfers Overall transfer level: Needs assistance   Transfers: Sit to/from Stand Sit to Stand: Mod assist, +2 physical assistance           General transfer comment: mod +2 assist to stand from bed and chair x 3 total trials, cues for hand placement and safety    Ambulation/Gait Ambulation/Gait assistance: Min assist, +2 safety/equipment Gait Distance (Feet): 8 Feet Assistive device: Rolling walker (2 wheels) Gait Pattern/deviations: Step-through pattern, Decreased stride length   Gait velocity interpretation: <1.8 ft/sec, indicate of risk for recurrent falls   General Gait Details: chair follow with pt able to walk 8' straight, assist for direction and balance. Seated rest then performed a 2nd trial   Stairs             Wheelchair Mobility    Modified Rankin (Stroke Patients Only)       Balance Overall balance assessment: Needs assistance Sitting-balance support: Bilateral upper extremity supported Sitting balance-Leahy Scale: Fair     Standing balance support: Bilateral upper extremity supported, During functional activity, Reliant on assistive device for balance Standing balance-Leahy Scale: Poor Standing balance comment: reliant on RW support                            Cognition Arousal/Alertness: Awake/alert Behavior During Therapy: WFL for tasks assessed/performed Overall Cognitive Status: Impaired/Different from baseline Area of Impairment: Awareness  Safety/Judgement: Decreased awareness of deficits     General Comments:  continues to be incontinent of stool without awareness and needing cues for precautions        Exercises      General Comments        Pertinent Vitals/Pain Pain Assessment Pain Score: 4  Pain Location: back Pain Descriptors / Indicators: Sore Pain Intervention(s): Limited activity within patient's tolerance, Monitored during session, Repositioned    Home Living                          Prior Function            PT Goals (current goals can now be found in the care plan section) Progress towards PT goals: Progressing toward goals    Frequency    Min 1X/week      PT Plan Current plan remains appropriate    Co-evaluation              AM-PAC PT "6 Clicks" Mobility   Outcome Measure  Help needed turning from your back to your side while in a flat bed without using bedrails?: A Little Help needed moving from lying on your back to sitting on the side of a flat bed without using bedrails?: A Lot Help needed moving to and from a bed to a chair (including a wheelchair)?: A Lot Help needed standing up from a chair using your arms (e.g., wheelchair or bedside chair)?: A Lot Help needed to walk in hospital room?: Total Help needed climbing 3-5 steps with a railing? : Total 6 Click Score: 11    End of Session Equipment Utilized During Treatment: Gait belt;Back brace Activity Tolerance: Patient tolerated treatment well Patient left: in chair;with call bell/phone within reach;with chair alarm set;with nursing/sitter in room Nurse Communication: Mobility status;Need for lift equipment;Precautions PT Visit Diagnosis: Other abnormalities of gait and mobility (R26.89);Muscle weakness (generalized) (M62.81)     Time: 5409-8119 PT Time Calculation (min) (ACUTE ONLY): 24 min  Charges:  $Gait Training: 8-22 mins $Therapeutic Activity: 8-22 mins                     Merryl Hacker, PT Acute Rehabilitation Services Office: [phone]    Jim Ramirez 01/23/2023, 9:31 AM

## 2023-01-23 NOTE — TOC Progression Note (Addendum)
Transition of Care [facility]) - Progression Note    Patient Details  Name: Jim Ramirez MRN: [id number] Date of Birth: 05-May-1951  [facility] CM/SW Contact  Leander Rams, LCSW Phone Number: 01/23/2023, 11:31 AM  Clinical Narrative:    CSW met with pt at bedside to discuss SNF bed offers. Pt would preferably like to dc to [facility]. That SNF is currently pending in HUB. CSW reached out to Destiny with [facility] to see if they're able to offer pt a bed. If pt can't go to [facility] he would like to accept bed offer from [facility] in [place].   CSW awaiting response from [facility]. TOC will continue to follow.   1:30PM [facility] informed CSW they aren't able to admit pt. Pt is agreeable to [facility]. Insurance auth started.   Expected Discharge Plan: Skilled Nursing Facility Barriers to Discharge: Continued Medical Work up  Expected Discharge Plan and Services                                               Social Determinants of Health (SDOH) Interventions SDOH Screenings   Transportation Needs: No Transportation Needs (12/26/2022)  Utilities: Not At Risk (12/26/2022)  Tobacco Use: Medium Risk (01/21/2023)    Readmission Risk Interventions     No data to display         Oletta Lamas, MSW, LCSWA, LCASA Transitions of Care  Clinical Social Worker I

## 2023-01-24 DIAGNOSIS — M4646 Discitis, unspecified, lumbar region: Secondary | ICD-10-CM | POA: Diagnosis not present

## 2023-01-24 LAB — GLUCOSE, CAPILLARY
Glucose-Capillary: 173 mg/dL — ABNORMAL HIGH (ref 70–99)
Glucose-Capillary: 209 mg/dL — ABNORMAL HIGH (ref 70–99)
Glucose-Capillary: 182 mg/dL — ABNORMAL HIGH (ref 70–99)
Glucose-Capillary: 270 mg/dL — ABNORMAL HIGH (ref 70–99)

## 2023-01-24 LAB — CBC
HCT: 25.2 % — ABNORMAL LOW (ref 39.0–52.0)
Hemoglobin: 7.6 g/dL — ABNORMAL LOW (ref 13.0–17.0)
MCH: 27 pg (ref 26.0–34.0)
MCHC: 30.2 g/dL (ref 30.0–36.0)
MCV: 89.4 fL (ref 80.0–100.0)
Platelets: 151 10*3/uL (ref 150–400)
RBC: 2.82 MIL/uL — ABNORMAL LOW (ref 4.22–5.81)
RDW: 19.3 % — ABNORMAL HIGH (ref 11.5–15.5)
WBC: 7.9 10*3/uL (ref 4.0–10.5)
nRBC: 0 % (ref 0.0–0.2)

## 2023-01-24 LAB — AEROBIC/ANAEROBIC CULTURE W GRAM STAIN (SURGICAL/DEEP WOUND)

## 2023-01-24 MED ORDER — FUROSEMIDE 10 MG/ML IJ SOLN
40.0000 mg | Freq: Once | INTRAMUSCULAR | Status: AC
  Administered 2023-01-24: 40 mg via INTRAVENOUS
  Filled 2023-01-24: qty 4

## 2023-01-24 NOTE — TOC Progression Note (Signed)
Transition of Care [facility]) - Progression Note    Patient Details  Name: Jim Ramirez MRN: [id number] Date of Birth: 1951/05/04  Transition of Care [facility]) CM/SW Contact  Leander Rams, LCSW Phone Number: 01/24/2023, 4:01 PM  Clinical Narrative:    Insurance auth for [facility] is still currently pending.   TOC will continue to follow.    Expected Discharge Plan: Skilled Nursing Facility Barriers to Discharge: Continued Medical Work up  Expected Discharge Plan and Services                                               Social Determinants of Health (SDOH) Interventions SDOH Screenings   Transportation Needs: No Transportation Needs (12/26/2022)  Utilities: Not At Risk (12/26/2022)  Tobacco Use: Medium Risk (01/21/2023)    Readmission Risk Interventions     No data to display         Oletta Lamas, MSW, LCSWA, LCASA Transitions of Care  Clinical Social Worker I

## 2023-01-24 NOTE — Progress Notes (Signed)
PROGRESS NOTE  Jim Ramirez  YNW:[id number] DOB: [birth year] DOA: 12/24/2022 PCP: [name], MD   Brief Narrative:  Patient is a 72 year old male with history of  hypertension and diabetes who initially presented to [facility] on 12/22/2022 for chronic back pain radiating both her legs, with some shortness of breath that started 1 week prior to admission. CT scan of the abdomen /pelvis showed lumbar spine discitis and osteomyelitis, transferred to [facility] . He was found to be bacteremic with staph aureus with necrosis of the lumbar spine, also in A-fib with RVR, neurology, ID and cardiology were consulted. MRI of the wrist was done that showed fluid collection and surgery was consulted who recommended conservative management. Despite IV antibiotics, he continued to spike fevers.  MRI lumbar spine done on 6/7 showed worsening of infection/concerns of epidural abscess .Initial blood cultures at [facility] grew MSSA. TEE from 6/4 does not show any vegetation but MRI does show small fluid collection.Infectious disease recommended IV nafcillin from negative cultures on 5/28 until 01/10/2023, then switched to cefazolin 2 g IV every 8 hourly for additional 6 weeks, thereafter cefadroxil 1000 mg twice daily (starting 7/24) for at least 10 months.   Due to progressive low back pain radiating down legs, MRI repeated 6/7 showing persistent L4, L5 osteonecrosis, probable L4/5 discitis with right L4-5 septic facet, multiple psoas intramuscular loculated fluid collections. Despite continued IV antibiotics, a repeat MRI 6/18 showing new epidural abscess and stenosis. S/P   left L3-4, L4-5 and L5-S1 laminotomy/foraminotomies for drainage of epidural abscess and decompression of the thecal sac by Dr. Lovell Sheehan on 01/20/2023 . PT/OT recommending SNF on discharge.TOC following.  Medically stable for discharge whenever possible  Assessment & Plan:  Principal Problem:   Discitis of lumbar region Active Problems:    Diabetes mellitus type 2 in nonobese (HCC)   Dyslipidemia   Essential hypertension   New onset atrial fibrillation (HCC)   Sepsis (HCC)   BPH (benign prostatic hyperplasia)   Mood disorder (HCC)   MSSA bacteremia   Malnutrition of moderate degree  Epidural abscess: Presented with acute exacerbation of chronic back pain.  Hospital course was remarkable for fever.  MRI was concerning for epidural abscess.  Initial blood cultures at [facility] showed MSSA, blood culture sent here did not show any growth.  ID was following ,cefazolin 2 g IV every 8 hourly for additional 6 weeks, thereafter cefadroxil 1000 mg twice daily (starting 7/24) for at least 10 months. S/P   left L3-4, L4-5 and L5-S1 laminotomy/foraminotomies for drainage of epidural abscess and decompression of the thecal sac by Dr. Lovell Sheehan on 01/20/2023 . Follow-up wound culture: no growth till date,no organisms seen.  Currently hemodynamically stable, afebrile.  Left wrist edema/concern for tenosynovitis: - MRI shows persistent and worsening abscess of the right wrist, [facility] was consulted, status post bedside aspiration 6/10 but not enough sample.   Planning to continue antibiotics.   Acute kidney injury: Resolved.   Osteonecrosis of L4-L5: - Continue supportive care    New onset paroxysmal atrial fibrillation: - Continue amiodarone, now down to 200mg  daily, remains in NSR. - Eliquis was held for neurosurgery,resumed on 6/27 -EKG monitor on 6/27 showed sinus tach -Continue low-dose metoprolol   Iron deficiency anemia:  - Continue iron supplements  -Hb stable   Elevate troponin: -Likely demand ischemia.   -No chest pain.     Constipation: Continue bowel regimen,having regular BMs   BPH: -Continue Flomax.   Diabetes mellitus type 2 with hyperglycemia: -  A1c of 8.1% - Continue current insulin regimen   Obstructive sleep apnea: -Continue CPAP at night.   Hyperlipidemia:  - Continue statin   Mood  disorder: - Continue lexapro daily, risperdal qHS   Moderate protein caloric malnutrition: - Supplement with protein.   LLE ACUTE DVT : Bilateral lower extremity duplex showed acute DVT involving left gastrocnemius, left posterior tibial veins.  IR consulted ,sp  IVC filter placement.resumed Eliquis on 6/27 as per neurosurgery recommendation .    Lower extremity edema: Has bilateral lower EXTR edema.  BNP normal.  Remains on room air.  Lungs are clear on auscultation.  Will give him a dose of IV Lasix.  Will continue to repeat the dose if blood pressure permits   Goals of care/disposition: - Patient has already been seen by palliative care.  Currently DNR but continue full treatment. PT/OT recommending SNF on discharge  Nutrition Problem: Moderate Malnutrition Etiology: acute illness Pressure Injury 01/03/23 Buttocks Right;Left Stage 2 -  Partial thickness loss of dermis presenting as a shallow open injury with a red, pink wound bed without slough. 1x1 (Active)  01/03/23 2100  Location: Buttocks  Location Orientation: Right;Left  Staging: Stage 2 -  Partial thickness loss of dermis presenting as a shallow open injury with a red, pink wound bed without slough.  Wound Description (Comments): 1x1  Present on Admission: No  Dressing Type Foam - Lift dressing to assess site every shift 01/24/23 0751     Pressure Injury 01/18/23 Heel Left Stage 1 -  Intact skin with non-blanchable redness of a localized area usually over a bony prominence. (Active)  01/18/23 1337  Location: Heel  Location Orientation: Left  Staging: Stage 1 -  Intact skin with non-blanchable redness of a localized area usually over a bony prominence.  Wound Description (Comments):   Present on Admission:   Dressing Type Foam - Lift dressing to assess site every shift 01/21/23 2056     Pressure Injury 01/19/23 Heel Right Stage 1 -  Intact skin with non-blanchable redness of a localized area usually over a bony prominence.  (Active)  01/19/23 1007  Location: Heel  Location Orientation: Right  Staging: Stage 1 -  Intact skin with non-blanchable redness of a localized area usually over a bony prominence.  Wound Description (Comments):   Present on Admission:   Dressing Type Foam - Lift dressing to assess site every shift 01/21/23 2056    DVT prophylaxis:SCD's Start: 01/20/23 2247 SCDs Start: 12/24/22 1740 apixaban (ELIQUIS) tablet 5 mg     Code Status: DNR  Family Communication: Sister at bedside on 6/26  Patient status:Inpatient  Patient is from :Home  Anticipated discharge to:SNF  Estimated DC date:whenever possible   Consultants: Neurosurgery, ID, IR  Procedures: Laminectomies,IVC filter placement  Antimicrobials:  Anti-infectives (From admission, onward)    Start     Dose/Rate Route Frequency Ordered Stop   02/19/23 1000  cefadroxil (DURICEF) capsule 1,000 mg        1,000 mg Oral 2 times daily 01/13/23 1117 11/26/23 0959   01/20/23 2345  ceFAZolin (ANCEF) IVPB 2g/100 mL premix  Status:  Discontinued        2 g 200 mL/hr over 30 Minutes Intravenous Every 8 hours 01/20/23 2246 01/20/23 2249   01/20/23 1630  ceFAZolin (ANCEF) IVPB 2g/100 mL premix        2 g 200 mL/hr over 30 Minutes Intravenous On call to O.R. 01/20/23 1607 01/20/23 1813   01/11/23 0600  ceFAZolin (ANCEF) IVPB  2g/100 mL premix        2 g 200 mL/hr over 30 Minutes Intravenous Every 8 hours 12/31/22 1449 02/18/23 2359   12/28/22 1015  nafcillin 12 g in sodium chloride 0.9 % 500 mL continuous infusion        12 g 20.8 mL/hr over 24 Hours Intravenous Every 24 hours 12/28/22 0925 01/11/23 1245   12/28/22 0830  nafcillin 2 g in sodium chloride 0.9 % 100 mL IVPB  Status:  Discontinued        2 g 216 mL/hr over 30 Minutes Intravenous Every 4 hours 12/28/22 0736 12/28/22 0925   12/26/22 1400  ceFAZolin (ANCEF) IVPB 2g/100 mL premix  Status:  Discontinued        2 g 200 mL/hr over 30 Minutes Intravenous Every 8 hours 12/26/22  0823 12/28/22 0736   12/25/22 2200  vancomycin (VANCOREADY) IVPB 1750 mg/350 mL  Status:  Discontinued        1,750 mg 175 mL/hr over 120 Minutes Intravenous Every 24 hours 12/25/22 0919 12/26/22 0823   12/25/22 1200  cefTRIAXone (ROCEPHIN) 2 g in sodium chloride 0.9 % 100 mL IVPB  Status:  Discontinued        2 g 200 mL/hr over 30 Minutes Intravenous Every 24 hours 12/25/22 0915 12/25/22 0935   12/24/22 2000  ceFEPIme (MAXIPIME) 2 g in sodium chloride 0.9 % 100 mL IVPB  Status:  Discontinued        2 g 200 mL/hr over 30 Minutes Intravenous Every 8 hours 12/24/22 1856 12/25/22 0837   12/24/22 2000  vancomycin (VANCOREADY) IVPB 1750 mg/350 mL  Status:  Discontinued        1,750 mg 175 mL/hr over 120 Minutes Intravenous Every 24 hours 12/24/22 1857 12/25/22 0837   12/24/22 1930  metroNIDAZOLE (FLAGYL) IVPB 500 mg  Status:  Discontinued        500 mg 100 mL/hr over 60 Minutes Intravenous Every 12 hours 12/24/22 1830 12/25/22 0837       Subjective: Patient seen and examined at bedside today.  Hemodynamically stable.  Comfortable.  Sitting in the chair.  Complains of some back pain but otherwise remains comfortable.  Continues to have significant bilateral lower extremity edema.  Denies shortness of breath or cough.  On room air.  Heart rate better controlled this morning  Objective: Vitals:   01/24/23 0340 01/24/23 0748 01/24/23 0900 01/24/23 0901  BP: 131/73 124/70    Pulse:  90 (!) 105 (!) 104  Resp: 18 16    Temp: 98.2 F (36.8 C) 98.2 F (36.8 C)    TempSrc: Oral Oral    SpO2: 98% 98%  97%  Weight:      Height:        Intake/Output Summary (Last 24 hours) at 01/24/2023 1050 Last data filed at 01/24/2023 0641 Gross per 24 hour  Intake 680 ml  Output 1425 ml  Net -745 ml   Filed Weights   01/05/23 0346 01/14/23 1241 01/19/23 0800  Weight: 86.5 kg 91.3 kg 91.3 kg    Examination: General exam: Overall comfortable, not in distress HEENT: PERRL Respiratory system:  no  wheezes or crackles  Cardiovascular system: S1 & S2 heard, RRR.  Gastrointestinal system: Abdomen is nondistended, soft and nontender. Central nervous system: Alert and oriented Extremities: Bilateral lower extremity pitting edema, no clubbing ,no cyanosis Skin: No rashes, no ulcers,no icterus    Data Reviewed: I have personally reviewed following labs and imaging studies  CBC: Recent  Labs  Lab 01/20/23 0744 01/21/23 0440 01/22/23 0520 01/23/23 0515 01/24/23 0500  WBC 10.9* 10.6* 10.8* 8.7 7.9  HGB 7.9* 8.4* 8.1* 8.1* 7.6*  HCT 26.0* 27.7* 26.6* 26.3* 25.2*  MCV 87.0 86.0 86.1 88.9 89.4  PLT 223 214 205 160 151   Basic Metabolic Panel: Recent Labs  Lab 01/18/23 0525 01/19/23 0512 01/21/23 0440 01/22/23 0520  NA 138 136 135 136  K 3.5 3.8 4.3 3.6  CL 101 100 101 102  CO2 26 26 26 28   GLUCOSE 138* 145* 220* 190*  BUN 16 15 13 9   CREATININE 1.15 1.07 0.68 0.70  CALCIUM 8.7* 8.5* 8.2* 8.1*     Recent Results (from the past 240 hour(s))  Surgical pcr screen     Status: None   Collection Time: 01/20/23  7:19 AM   Specimen: Nasal Mucosa; Nasal Swab  Result Value Ref Range Status   MRSA, PCR NEGATIVE NEGATIVE Final   Staphylococcus aureus NEGATIVE NEGATIVE Final    Comment: (NOTE) The Xpert SA Assay (FDA approved for NASAL specimens in patients 56 years of age and older), is one component of a comprehensive surveillance program. It is not intended to diagnose infection nor to guide or monitor treatment. Performed at [facility], [address] [place], [place] [address]   Aerobic/Anaerobic Culture w Gram Stain (surgical/deep wound)     Status: None (Preliminary result)   Collection Time: 01/20/23  7:01 PM   Specimen: Wound; Abscess  Result Value Ref Range Status   Specimen Description WOUND  Final   Special Requests LUMBAR EPIDURAL SPACE  Final   Gram Stain   Final    FEW WBC PRESENT,BOTH PMN AND MONONUCLEAR NO ORGANISMS SEEN    Culture   Final    NO  GROWTH 3 DAYS NO ANAEROBES ISOLATED; CULTURE IN PROGRESS FOR 5 DAYS Performed at [facility], [address], [place], [place] [address]    Report Status PENDING  Incomplete  Aerobic/Anaerobic Culture w Gram Stain (surgical/deep wound)     Status: None (Preliminary result)   Collection Time: 01/20/23  7:27 PM   Specimen: Abscess  Result Value Ref Range Status   Specimen Description ABSCESS  Final   Special Requests EPIDURAL ABSCESS  Final   Gram Stain   Final    RARE WBC PRESENT,BOTH PMN AND MONONUCLEAR NO ORGANISMS SEEN    Culture   Final    NO GROWTH 3 DAYS NO ANAEROBES ISOLATED; CULTURE IN PROGRESS FOR 5 DAYS Performed at [facility], [address] [place], [place] [address]    Report Status PENDING  Incomplete     Radiology Studies: No results found.  Scheduled Meds:  amiodarone  200 mg Oral Daily   apixaban  5 mg Oral BID   azelastine  1 spray Each Nare BID   [START ON 02/19/2023] cefadroxil  1,000 mg Oral BID   Chlorhexidine Gluconate Cloth  6 each Topical Daily   diclofenac Sodium  2 g Topical QID   docusate sodium  100 mg Oral BID   escitalopram  10 mg Oral Daily   ferrous sulfate  325 mg Oral Q breakfast   fluticasone  2 spray Each Nare Daily   furosemide  40 mg Intravenous Once   gabapentin  100 mg Oral BID   insulin aspart  0-15 Units Subcutaneous TID WC   insulin aspart  0-5 Units Subcutaneous QHS   insulin glargine-yfgn  15 Units Subcutaneous QHS  ketotifen  1 drop Left Eye BID   lidocaine  2 patch Transdermal Q24H   loratadine  10 mg Oral Daily   melatonin  3 mg Oral QHS   methocarbamol  500 mg Oral BID   metoprolol tartrate  12.5 mg Oral BID   multivitamin with minerals  1 tablet Oral Daily   pantoprazole  40 mg Oral BID   risperiDONE  0.5 mg Oral QHS   rosuvastatin  20 mg Oral Daily   senna  2 tablet Oral Daily   sodium chloride flush  10-40 mL Intracatheter Q12H   sodium chloride flush  3 mL Intravenous Q12H   sodium chloride flush   3 mL Intravenous Q12H   tamsulosin  0.4 mg Oral Daily   Continuous Infusions:  sodium chloride     sodium chloride     sodium chloride Stopped (01/20/23 2250)    ceFAZolin (ANCEF) IV 2 g (01/24/23 0504)     LOS: 31 days   Burnadette Pop, MD [facility] P6/28/2024, 10:50 AM

## 2023-01-24 NOTE — Inpatient Diabetes Management (Signed)
Inpatient Diabetes Program Recommendations  AACE/ADA: New Consensus Statement on Inpatient Glycemic Control (2015)  Target Ranges:  Prepandial:   less than 140 mg/dL      Peak postprandial:   less than 180 mg/dL (1-2 hours)      Critically ill patients:  140 - 180 mg/dL   Lab Results  Component Value Date   GLUCAP 173 (H) 01/24/2023   HGBA1C 8.5 (H) 12/24/2022    Review of Glycemic Control  Latest Reference Range & Units 01/23/23 06:12 01/23/23 11:25 01/23/23 16:41 01/23/23 21:06 01/24/23 06:13  Glucose-Capillary 70 - 99 mg/dL 595 (H) 638 (H) 756 (H) 234 (H) 173 (H)   Diabetes history: DM 2 Outpatient Diabetes medications: Jardiance 25 mg Daily, 70/30 50 units qam, 20 units qpm, Metformin 1000 mg bid Current orders for Inpatient glycemic control:  Novolog 0-15 units tid + hs Semglee 15 units qhs  Inpatient Diabetes Program Recommendations:    Pt with Poor po intake however with a large postprandial spike in the 300 range.  -  may need to add Novolog 3 units tid meal coverage insulin, however, with parameters (given if glucose is at least 80 mg/dl and pt consumes at least 50% of meals).  Thanks,  Christena Deem RN, MSN, BC-ADM Inpatient Diabetes Coordinator Team Pager [phone] (8a-5p)

## 2023-01-24 NOTE — Progress Notes (Signed)
Mobility Specialist: Progress Note   01/24/23 1041  Mobility  Activity Transferred from chair to bed  Level of Assistance +2 (takes two people)  Press photographer wheel walker  Distance Ambulated (ft) 2 ft  Activity Response Tolerated fair  Mobility Referral Yes  $Mobility charge 1 Mobility  Mobility Specialist Start Time (ACUTE ONLY) 1025  Mobility Specialist Stop Time (ACUTE ONLY) 1035  Mobility Specialist Time Calculation (min) (ACUTE ONLY) 10 min   Pt received in the chair and assisted back to bed per request with NT. +2 modA to stand and pivot to the bed. Cues for upright posture and direction. C/o BLE weakness during transfer. Pt back in bed with NT present in the room.   Jim Ramirez Mobility Specialist Please contact via SecureChat or Rehab office at [phone]

## 2023-01-24 NOTE — Progress Notes (Signed)
   Providing Compassionate, Quality Care - Together   Subjective: Patient reports no new issues. He has been ambulating in the hall. He is up to chair during this assessment.  Objective: Vital signs in last 24 hours: Temp:  [98 F (36.7 C)-98.6 F (37 C)] 98.2 F (36.8 C) (06/28 0748) Pulse Rate:  [90-105] 105 (06/28 0900) Resp:  [16-26] 16 (06/28 0748) BP: (100-131)/(60-73) 124/70 (06/28 0748) SpO2:  [96 %-99 %] 98 % (06/28 0748)  Intake/Output from previous day: 06/27 0701 - 06/28 0700 In: 680 [P.O.:480; IV Piggyback:200] Out: 1425 [Urine:1425] Intake/Output this shift: No intake/output data recorded.  Alert and oriented x 4 PERRLA CN II-XII grossly intact MAE, mild generalized weakness Incision is covered with Honeycomb dressing and Steri Strips; Dressing is clean, dry, and intact   Lab Results: Recent Labs    01/23/23 0515 01/24/23 0500  WBC 8.7 7.9  HGB 8.1* 7.6*  HCT 26.3* 25.2*  PLT 160 151   BMET Recent Labs    01/22/23 0520  NA 136  K 3.6  CL 102  CO2 28  GLUCOSE 190*  BUN 9  CREATININE 0.70  CALCIUM 8.1*    Studies/Results: No results found.  Assessment/Plan: Patient admitted with significant back pain with radiation down BLE. He was septic with elevated troponin and a-fib with RVR. 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 Patient underwent left L3-4, L4-5 and L5-S1 laminotomy/foraminotomies for drainage of epidural abscess and decompression of the thecal sac by Dr. Lovell Sheehan on 01/20/2023.   LOS: 31 days   -Mobilize with therapies -Continue abx per ID -Keep incision dry. Change dressing as needed to ensure site stays dry.   Val Eagle, DNP, AGNP-C Nurse Practitioner  [facility] [address], [address], [place], [place] [address] P:  [phone]    F: [phone]  01/24/2023, 9:03 AM

## 2023-01-24 NOTE — Progress Notes (Signed)
Physical Therapy Treatment Patient Details Name: Jim Ramirez MRN: [id number] DOB: 1951/01/10 Today's Date: 01/24/2023   History of Present Illness 72 yo male admitted 5/28 with back pain with osteonecrosis of lumbar spine L4-5. Rt wrist edema with tenosynovitis, new Afib with RVR. MRI brain negative for acute infarct, but with pyogenic ventriculitis. Bil UE superficial thrombosis. LE Doppler 6/22 positive for acute LLE DVT. 6/23 IR placement of IVC filter. 6/24 L3-S1 laminotomy and decompression. PMhx: ACDF, HTN, T2DM    PT Comments    Pt pleasant and remains highly motivated to progress activity. Pt progressing to 15' ambulation x 3 trials today with +2 assist. Pt continues to have oozing BM throughout activity with total assist for pericare as well as max assist for brace management. Plan remains appropriate and will continue to follow.  VSS    Recommendations for follow up therapy are one component of a multi-disciplinary discharge planning process, led by the attending physician.  Recommendations may be updated based on patient status, additional functional criteria and insurance authorization.  Follow Up Recommendations  Can patient physically be transported by private vehicle: No    Assistance Recommended at Discharge Frequent or constant Supervision/Assistance  Patient can return home with the following Assistance with cooking/housework;Assistance with feeding;Help with stairs or ramp for entrance;Assist for transportation;Direct supervision/assist for financial management;A lot of help with bathing/dressing/bathroom;Two people to help with walking and/or transfers   Equipment Recommendations  Hospital bed;Wheelchair (measurements PT);Wheelchair cushion (measurements PT);BSC/3in1;Rolling walker (2 wheels)    Recommendations for Other Services       Precautions / Restrictions Precautions Precautions: Back;Fall Precaution Comments: educate for precautions, max assist to don  brace, incontinent bowel Required Braces or Orthoses: Spinal Brace Spinal Brace: Lumbar corset;Applied in sitting position Restrictions Weight Bearing Restrictions: No     Mobility  Bed Mobility Overal bed mobility: Needs Assistance Bed Mobility: Rolling, Sidelying to Sit Rolling: Min assist Sidelying to sit: Mod assist       General bed mobility comments: cues for sequence with physical assist to fully roll and elevate trunk. total assist for pericare in sidelying due to incontinent stool    Transfers Overall transfer level: Needs assistance     Sit to Stand: Mod assist, +2 physical assistance           General transfer comment: mod +2 assist to stand from bed and chair x 5 total trials, cues for hand placement and safety    Ambulation/Gait Ambulation/Gait assistance: Min assist, +2 safety/equipment Gait Distance (Feet): 15 Feet Assistive device: Rolling walker (2 wheels) Gait Pattern/deviations: Step-through pattern, Decreased stride length   Gait velocity interpretation: <1.8 ft/sec, indicate of risk for recurrent falls   General Gait Details: chair follow with pt able to walk 15' straight, assist for direction and balance x 3 trials with seated rest between each. Cues for increased BOS and midline position in RW   Stairs             Wheelchair Mobility    Modified Rankin (Stroke Patients Only)       Balance Overall balance assessment: Needs assistance Sitting-balance support: Bilateral upper extremity supported Sitting balance-Leahy Scale: Fair     Standing balance support: Bilateral upper extremity supported, During functional activity, Reliant on assistive device for balance Standing balance-Leahy Scale: Poor Standing balance comment: reliant on RW support  Cognition Arousal/Alertness: Awake/alert Behavior During Therapy: WFL for tasks assessed/performed Overall Cognitive Status: Impaired/Different from  baseline Area of Impairment: Memory                     Memory: Decreased recall of precautions   Safety/Judgement: Decreased awareness of deficits              Exercises      General Comments        Pertinent Vitals/Pain Pain Assessment Pain Assessment: No/denies pain    Home Living                          Prior Function            PT Goals (current goals can now be found in the care plan section) Progress towards PT goals: Progressing toward goals    Frequency    Min 1X/week      PT Plan Current plan remains appropriate    Co-evaluation              AM-PAC PT "6 Clicks" Mobility   Outcome Measure  Help needed turning from your back to your side while in a flat bed without using bedrails?: A Little Help needed moving from lying on your back to sitting on the side of a flat bed without using bedrails?: A Lot Help needed moving to and from a bed to a chair (including a wheelchair)?: A Lot Help needed standing up from a chair using your arms (e.g., wheelchair or bedside chair)?: A Lot Help needed to walk in hospital room?: Total Help needed climbing 3-5 steps with a railing? : Total 6 Click Score: 11    End of Session Equipment Utilized During Treatment: Gait belt;Back brace Activity Tolerance: Patient tolerated treatment well Patient left: in chair;with call bell/phone within reach;with chair alarm set;with nursing/sitter in room Nurse Communication: Mobility status;Need for lift equipment;Precautions PT Visit Diagnosis: Other abnormalities of gait and mobility (R26.89);Muscle weakness (generalized) (M62.81)     Time: 1610-9604 PT Time Calculation (min) (ACUTE ONLY): 27 min  Charges:  $Gait Training: 8-22 mins $Therapeutic Activity: 8-22 mins                     Merryl Hacker, PT Acute Rehabilitation Services Office: [phone]    Enedina Finner [name] 01/24/2023, 8:40 AM

## 2023-01-25 DIAGNOSIS — M4646 Discitis, unspecified, lumbar region: Secondary | ICD-10-CM | POA: Diagnosis not present

## 2023-01-25 LAB — BASIC METABOLIC PANEL
Anion gap: 7 (ref 5–15)
BUN: 10 mg/dL (ref 8–23)
CO2: 27 mmol/L (ref 22–32)
Calcium: 7.9 mg/dL — ABNORMAL LOW (ref 8.9–10.3)
Chloride: 99 mmol/L (ref 98–111)
Creatinine, Ser: 0.71 mg/dL (ref 0.61–1.24)
GFR, Estimated: >60 mL/min (ref >60)
Glucose, Bld: 190 mg/dL — ABNORMAL HIGH (ref 70–99)
Potassium: 2.9 mmol/L — ABNORMAL LOW (ref 3.5–5.1)
Sodium: 133 mmol/L — ABNORMAL LOW (ref 135–145)

## 2023-01-25 LAB — CBC
HCT: 24.8 % — ABNORMAL LOW (ref 39.0–52.0)
Hemoglobin: 7.5 g/dL — ABNORMAL LOW (ref 13.0–17.0)
MCH: 27.2 pg (ref 26.0–34.0)
MCHC: 30.2 g/dL (ref 30.0–36.0)
MCV: 89.9 fL (ref 80.0–100.0)
Platelets: 184 10*3/uL (ref 150–400)
RBC: 2.76 MIL/uL — ABNORMAL LOW (ref 4.22–5.81)
RDW: 19.4 % — ABNORMAL HIGH (ref 11.5–15.5)
WBC: 7.6 10*3/uL (ref 4.0–10.5)
nRBC: 0 % (ref 0.0–0.2)

## 2023-01-25 LAB — GLUCOSE, CAPILLARY
Glucose-Capillary: 200 mg/dL — ABNORMAL HIGH (ref 70–99)
Glucose-Capillary: 157 mg/dL — ABNORMAL HIGH (ref 70–99)
Glucose-Capillary: 197 mg/dL — ABNORMAL HIGH (ref 70–99)
Glucose-Capillary: 349 mg/dL — ABNORMAL HIGH (ref 70–99)

## 2023-01-25 LAB — AEROBIC/ANAEROBIC CULTURE W GRAM STAIN (SURGICAL/DEEP WOUND): Culture: NO GROWTH

## 2023-01-25 LAB — MAGNESIUM: Magnesium: 1.6 mg/dL — ABNORMAL LOW (ref 1.7–2.4)

## 2023-01-25 MED ORDER — METHOCARBAMOL 500 MG PO TABS: 500.0000 mg | ORAL_TABLET | Freq: Three times a day (TID) | ORAL | Status: DC | PRN

## 2023-01-25 MED ORDER — MORPHINE SULFATE (PF) 2 MG/ML IV SOLN
2.0000 mg | INTRAVENOUS | Status: DC | PRN
  Administered 2023-01-25 – 2023-01-27 (×3): 2 mg via INTRAVENOUS
  Filled 2023-01-25 (×3): qty 1

## 2023-01-25 MED ORDER — MAGNESIUM SULFATE 2 GM/50ML IV SOLN
2.0000 g | Freq: Once | INTRAVENOUS | Status: AC
  Administered 2023-01-25: 2 g via INTRAVENOUS
  Filled 2023-01-25: qty 50

## 2023-01-25 MED ORDER — POTASSIUM CHLORIDE CRYS ER 20 MEQ PO TBCR
40.0000 meq | EXTENDED_RELEASE_TABLET | ORAL | Status: AC
  Administered 2023-01-25 (×2): 40 meq via ORAL
  Filled 2023-01-25 (×2): qty 2

## 2023-01-25 MED ORDER — FUROSEMIDE 10 MG/ML IJ SOLN
20.0000 mg | Freq: Two times a day (BID) | INTRAMUSCULAR | Status: DC
  Administered 2023-01-25 (×2): 20 mg via INTRAVENOUS
  Filled 2023-01-25 (×2): qty 2

## 2023-01-25 NOTE — Progress Notes (Addendum)
PROGRESS NOTE  Jim Ramirez  WJX:[id number] DOB: 16-Sep-1950 DOA: 12/24/2022 PCP: [name], MD   Brief Narrative:  Patient is a 72 year old male with history of  hypertension and diabetes who initially presented to [facility] on 12/22/2022 for chronic back pain radiating both her legs, with some shortness of breath that started 1 week prior to admission. CT scan of the abdomen /pelvis showed lumbar spine discitis and osteomyelitis, transferred to [facility] . He was found to be bacteremic with staph aureus with necrosis of the lumbar spine, also in A-fib with RVR, neurology, ID and cardiology were consulted. MRI of the wrist was done that showed fluid collection and surgery was consulted who recommended conservative management. Despite IV antibiotics, he continued to spike fevers.  MRI lumbar spine done on 6/7 showed worsening of infection/concerns of epidural abscess .Initial blood cultures at [facility] grew MSSA. TEE from 6/4 does not show any vegetation but MRI does show small fluid collection.Infectious disease recommended IV nafcillin from negative cultures on 5/28 until 01/10/2023, then switched to cefazolin 2 g IV every 8 hourly for additional 6 weeks, thereafter cefadroxil 1000 mg twice daily (starting 7/24) for at least 10 months.   Due to progressive low back pain radiating down legs, MRI repeated 6/7 showing persistent L4, L5 osteonecrosis, probable L4/5 discitis with right L4-5 septic facet, multiple psoas intramuscular loculated fluid collections. Despite continued IV antibiotics, a repeat MRI 6/18 showing new epidural abscess and stenosis. S/P   left L3-4, L4-5 and L5-S1 laminotomy/foraminotomies for drainage of epidural abscess and decompression of the thecal sac by Dr. Lovell Sheehan on 01/20/2023 . PT/OT recommending SNF on discharge.TOC following.  Medically stable for discharge whenever possible  Assessment & Plan:  Principal Problem:   Discitis of lumbar region Active Problems:    Diabetes mellitus type 2 in nonobese (HCC)   Dyslipidemia   Essential hypertension   New onset atrial fibrillation (HCC)   Sepsis (HCC)   BPH (benign prostatic hyperplasia)   Mood disorder (HCC)   MSSA bacteremia   Malnutrition of moderate degree  Epidural abscess: Presented with acute exacerbation of chronic back pain.  Hospital course was remarkable for fever.  MRI was concerning for epidural abscess.  Initial blood cultures at [facility] showed MSSA, blood culture sent here did not show any growth.  ID was following ,cefazolin 2 g IV every 8 hourly for additional 6 weeks, thereafter cefadroxil 1000 mg twice daily (starting 7/24) for at least 10 months. S/P   left L3-4, L4-5 and L5-S1 laminotomy/foraminotomies for drainage of epidural abscess and decompression of the thecal sac by Dr. Lovell Sheehan on 01/20/2023 . Follow-up wound culture: no growth till date,no organisms seen.  Currently hemodynamically stable, afebrile.  Left wrist edema/concern for tenosynovitis: - MRI shows persistent and worsening abscess of the right wrist, [facility] was consulted, status post bedside aspiration 6/10 but not enough sample.   Planning to continue antibiotics.   Acute kidney injury: Resolved.  Hypokalemia/hypomagnesemia: Currently being supplemented and monitored   Osteonecrosis of L4-L5: - Continue supportive care    New onset paroxysmal atrial fibrillation: - Continue amiodarone, now down to 200mg  daily, remains in NSR. - Eliquis was held for neurosurgery,resumed on 6/27 -EKG monitor on 6/27 showed sinus tach -Continue low-dose metoprolol   Iron deficiency anemia:  - Continue iron supplements  -Hb in the range of 7.  Will transfuse if it falls below 7.   Elevate troponin: -Likely demand ischemia.   -No chest pain.  Constipation: Continue bowel regimen,having regular BMs   BPH: -Continue Flomax.   Diabetes mellitus type 2 with hyperglycemia: -A1c of 8.1% - Continue current insulin  regimen   Obstructive sleep apnea: -Continue CPAP at night.   Hyperlipidemia:  - Continue statin   Mood disorder: - Continue lexapro daily, risperdal qHS   Moderate protein caloric malnutrition: - Supplement with protein.   LLE ACUTE DVT : Bilateral lower extremity duplex showed acute DVT involving left gastrocnemius, left posterior tibial veins.  IR consulted ,sp  IVC filter placement.resumed Eliquis on 6/27 as per neurosurgery recommendation .    Lower extremity edema: Has bilateral lower EXTR edema.  BNP normal.  Remains on room air.  Lungs are clear on auscultation.    Will continue lasix iv 20 mg bid  if blood pressure permits   Goals of care/disposition: - Patient has already been seen by palliative care.  Currently DNR but continue full treatment. PT/OT recommending SNF on discharge  Nutrition Problem: Moderate Malnutrition Etiology: acute illness Pressure Injury 01/03/23 Buttocks Right;Left Stage 2 -  Partial thickness loss of dermis presenting as a shallow open injury with a red, pink wound bed without slough. 1x1 (Active)  01/03/23 2100  Location: Buttocks  Location Orientation: Right;Left  Staging: Stage 2 -  Partial thickness loss of dermis presenting as a shallow open injury with a red, pink wound bed without slough.  Wound Description (Comments): 1x1  Present on Admission: No  Dressing Type Foam - Lift dressing to assess site every shift 01/25/23 0800     Pressure Injury 01/18/23 Heel Left Stage 1 -  Intact skin with non-blanchable redness of a localized area usually over a bony prominence. (Active)  01/18/23 1337  Location: Heel  Location Orientation: Left  Staging: Stage 1 -  Intact skin with non-blanchable redness of a localized area usually over a bony prominence.  Wound Description (Comments):   Present on Admission:   Dressing Type Foam - Lift dressing to assess site every shift 01/21/23 2056     Pressure Injury 01/19/23 Heel Right Stage 1 -  Intact skin  with non-blanchable redness of a localized area usually over a bony prominence. (Active)  01/19/23 1007  Location: Heel  Location Orientation: Right  Staging: Stage 1 -  Intact skin with non-blanchable redness of a localized area usually over a bony prominence.  Wound Description (Comments):   Present on Admission:   Dressing Type Foam - Lift dressing to assess site every shift 01/21/23 2056    DVT prophylaxis:SCD's Start: 01/20/23 2247 SCDs Start: 12/24/22 1740 apixaban (ELIQUIS) tablet 5 mg     Code Status: DNR  Family Communication: Sister at bedside on 6/26  Patient status:Inpatient  Patient is from :Home  Anticipated discharge to:SNF  Estimated DC date:whenever possible   Consultants: Neurosurgery, ID, IR  Procedures: Laminectomies,IVC filter placement  Antimicrobials:  Anti-infectives (From admission, onward)    Start     Dose/Rate Route Frequency Ordered Stop   02/19/23 1000  cefadroxil (DURICEF) capsule 1,000 mg        1,000 mg Oral 2 times daily 01/13/23 1117 11/26/23 0959   01/20/23 2345  ceFAZolin (ANCEF) IVPB 2g/100 mL premix  Status:  Discontinued        2 g 200 mL/hr over 30 Minutes Intravenous Every 8 hours 01/20/23 2246 01/20/23 2249   01/20/23 1630  ceFAZolin (ANCEF) IVPB 2g/100 mL premix        2 g 200 mL/hr over 30 Minutes Intravenous On call  to O.R. 01/20/23 1607 01/20/23 1813   01/11/23 0600  ceFAZolin (ANCEF) IVPB 2g/100 mL premix        2 g 200 mL/hr over 30 Minutes Intravenous Every 8 hours 12/31/22 1449 02/18/23 2359   12/28/22 1015  nafcillin 12 g in sodium chloride 0.9 % 500 mL continuous infusion        12 g 20.8 mL/hr over 24 Hours Intravenous Every 24 hours 12/28/22 0925 01/11/23 1245   12/28/22 0830  nafcillin 2 g in sodium chloride 0.9 % 100 mL IVPB  Status:  Discontinued        2 g 216 mL/hr over 30 Minutes Intravenous Every 4 hours 12/28/22 0736 12/28/22 0925   12/26/22 1400  ceFAZolin (ANCEF) IVPB 2g/100 mL premix  Status:   Discontinued        2 g 200 mL/hr over 30 Minutes Intravenous Every 8 hours 12/26/22 0823 12/28/22 0736   12/25/22 2200  vancomycin (VANCOREADY) IVPB 1750 mg/350 mL  Status:  Discontinued        1,750 mg 175 mL/hr over 120 Minutes Intravenous Every 24 hours 12/25/22 0919 12/26/22 0823   12/25/22 1200  cefTRIAXone (ROCEPHIN) 2 g in sodium chloride 0.9 % 100 mL IVPB  Status:  Discontinued        2 g 200 mL/hr over 30 Minutes Intravenous Every 24 hours 12/25/22 0915 12/25/22 0935   12/24/22 2000  ceFEPIme (MAXIPIME) 2 g in sodium chloride 0.9 % 100 mL IVPB  Status:  Discontinued        2 g 200 mL/hr over 30 Minutes Intravenous Every 8 hours 12/24/22 1856 12/25/22 0837   12/24/22 2000  vancomycin (VANCOREADY) IVPB 1750 mg/350 mL  Status:  Discontinued        1,750 mg 175 mL/hr over 120 Minutes Intravenous Every 24 hours 12/24/22 1857 12/25/22 0837   12/24/22 1930  metroNIDAZOLE (FLAGYL) IVPB 500 mg  Status:  Discontinued        500 mg 100 mL/hr over 60 Minutes Intravenous Every 12 hours 12/24/22 1830 12/25/22 0837       Subjective: Patient seen and examined the bedside today.  Hemodynamically stable.  Heart rate better.  He was found slumped on the edge of the bed.  When I called his name he woke up and said he does not have any problems.  Continues to have lower extremity edema  Objective: Vitals:   01/24/23 2000 01/24/23 2318 01/25/23 0353 01/25/23 0802  BP:  (!) 98/56 116/60 115/65  Pulse: 97 78 84 86  Resp: 20 16 16 13   Temp:  98.1 F (36.7 C) 97.9 F (36.6 C) 98.2 F (36.8 C)  TempSrc:  Oral Oral Oral  SpO2: 98% 98% 98% 100%  Weight:      Height:        Intake/Output Summary (Last 24 hours) at 01/25/2023 1056 Last data filed at 01/25/2023 0600 Gross per 24 hour  Intake 2393.33 ml  Output 1950 ml  Net 443.33 ml   Filed Weights   01/05/23 0346 01/14/23 1241 01/19/23 0800  Weight: 86.5 kg 91.3 kg 91.3 kg    Examination:  General exam: Overall comfortable, not in  distress,weak, very deconditioned HEENT: PERRL Respiratory system:  no wheezes or crackles  Cardiovascular system: S1 & S2 heard, RRR.  Gastrointestinal system: Abdomen is nondistended, soft and nontender. Central nervous system: Alert and oriented Extremities: Bilateral lower extremity edema, no clubbing ,no cyanosis Skin: No rashes, no ulcers,no icterus  Data Reviewed: I have personally reviewed following labs and imaging studies  CBC: Recent Labs  Lab 01/21/23 0440 01/22/23 0520 01/23/23 0515 01/24/23 0500 01/25/23 0445  WBC 10.6* 10.8* 8.7 7.9 7.6  HGB 8.4* 8.1* 8.1* 7.6* 7.5*  HCT 27.7* 26.6* 26.3* 25.2* 24.8*  MCV 86.0 86.1 88.9 89.4 89.9  PLT 214 205 160 151 184   Basic Metabolic Panel: Recent Labs  Lab 01/19/23 0512 01/21/23 0440 01/22/23 0520 01/25/23 0445  NA 136 135 136 133*  K 3.8 4.3 3.6 2.9*  CL 100 101 102 99  CO2 26 26 28 27   GLUCOSE 145* 220* 190* 190*  BUN 15 13 9 10   CREATININE 1.07 0.68 0.70 0.71  CALCIUM 8.5* 8.2* 8.1* 7.9*  MG  --   --   --  1.6*     Recent Results (from the past 240 hour(s))  Surgical pcr screen     Status: None   Collection Time: 01/20/23  7:19 AM   Specimen: Nasal Mucosa; Nasal Swab  Result Value Ref Range Status   MRSA, PCR NEGATIVE NEGATIVE Final   Staphylococcus aureus NEGATIVE NEGATIVE Final    Comment: (NOTE) The Xpert SA Assay (FDA approved for NASAL specimens in patients 46 years of age and older), is one component of a comprehensive surveillance program. It is not intended to diagnose infection nor to guide or monitor treatment. Performed at [facility], [address] [place], [place] [address]   Aerobic/Anaerobic Culture w Gram Stain (surgical/deep wound)     Status: None (Preliminary result)   Collection Time: 01/20/23  7:01 PM   Specimen: Wound; Abscess  Result Value Ref Range Status   Specimen Description WOUND  Final   Special Requests LUMBAR EPIDURAL SPACE  Final   Gram Stain   Final     FEW WBC PRESENT,BOTH PMN AND MONONUCLEAR NO ORGANISMS SEEN    Culture   Final    NO GROWTH 4 DAYS NO ANAEROBES ISOLATED; CULTURE IN PROGRESS FOR 5 DAYS Performed at [facility], [address], [place], [place] [address]    Report Status PENDING  Incomplete  Aerobic/Anaerobic Culture w Gram Stain (surgical/deep wound)     Status: None (Preliminary result)   Collection Time: 01/20/23  7:27 PM   Specimen: Abscess  Result Value Ref Range Status   Specimen Description ABSCESS  Final   Special Requests EPIDURAL ABSCESS  Final   Gram Stain   Final    RARE WBC PRESENT,BOTH PMN AND MONONUCLEAR NO ORGANISMS SEEN    Culture   Final    NO GROWTH 4 DAYS NO ANAEROBES ISOLATED; CULTURE IN PROGRESS FOR 5 DAYS Performed at [facility], [address] [place], [place] [address]    Report Status PENDING  Incomplete     Radiology Studies: No results found.  Scheduled Meds:  amiodarone  200 mg Oral Daily   apixaban  5 mg Oral BID   azelastine  1 spray Each Nare BID   [START ON 02/19/2023] cefadroxil  1,000 mg Oral BID   Chlorhexidine Gluconate Cloth  6 each Topical Daily   diclofenac Sodium  2 g Topical QID   docusate sodium  100 mg Oral BID   escitalopram  10 mg Oral Daily   ferrous sulfate  325 mg Oral Q breakfast   fluticasone  2 spray Each Nare Daily   furosemide  20 mg Intravenous Q12H   gabapentin  100 mg Oral BID   insulin  aspart  0-15 Units Subcutaneous TID WC   insulin aspart  0-5 Units Subcutaneous QHS   insulin glargine-yfgn  15 Units Subcutaneous QHS   ketotifen  1 drop Left Eye BID   lidocaine  2 patch Transdermal Q24H   loratadine  10 mg Oral Daily   melatonin  3 mg Oral QHS   methocarbamol  500 mg Oral BID   metoprolol tartrate  12.5 mg Oral BID   multivitamin with minerals  1 tablet Oral Daily   pantoprazole  40 mg Oral BID   risperiDONE  0.5 mg Oral QHS   rosuvastatin  20 mg Oral Daily   senna  2 tablet Oral Daily   sodium chloride flush  10-40 mL  Intracatheter Q12H   sodium chloride flush  3 mL Intravenous Q12H   sodium chloride flush  3 mL Intravenous Q12H   tamsulosin  0.4 mg Oral Daily   Continuous Infusions:  sodium chloride     sodium chloride     sodium chloride Stopped (01/20/23 2250)    ceFAZolin (ANCEF) IV 2 g (01/25/23 0519)     LOS: 32 days   Burnadette Pop, MD [facility] P6/29/2024, 10:56 AM

## 2023-01-25 NOTE — Progress Notes (Signed)
NEUROSURGERY PROGRESS NOTE  S/p lumbar laminectomy for evacuation of epidural abscess on 6/24. Continue to work with therapy and abx per ID.   Temp:  [97.9 F (36.6 C)-98.4 F (36.9 C)] 98.2 F (36.8 C) (06/29 0802) Pulse Rate:  [78-97] 86 (06/29 0802) Resp:  [13-20] 13 (06/29 0802) BP: (98-125)/(56-76) 115/65 (06/29 0802) SpO2:  [98 %-100 %] 100 % (06/29 0802)    Sherryl Manges, NP 01/25/2023 10:42 AM

## 2023-01-26 DIAGNOSIS — M4646 Discitis, unspecified, lumbar region: Secondary | ICD-10-CM | POA: Diagnosis not present

## 2023-01-26 LAB — BASIC METABOLIC PANEL
Anion gap: 7 (ref 5–15)
BUN: 9 mg/dL (ref 8–23)
CO2: 28 mmol/L (ref 22–32)
Calcium: 8.3 mg/dL — ABNORMAL LOW (ref 8.9–10.3)
Chloride: 99 mmol/L (ref 98–111)
Creatinine, Ser: 0.82 mg/dL (ref 0.61–1.24)
GFR, Estimated: >60 mL/min (ref >60)
Glucose, Bld: 155 mg/dL — ABNORMAL HIGH (ref 70–99)
Potassium: 3.3 mmol/L — ABNORMAL LOW (ref 3.5–5.1)
Sodium: 134 mmol/L — ABNORMAL LOW (ref 135–145)

## 2023-01-26 LAB — CBC
HCT: 25.8 % — ABNORMAL LOW (ref 39.0–52.0)
Hemoglobin: 7.8 g/dL — ABNORMAL LOW (ref 13.0–17.0)
MCH: 26.7 pg (ref 26.0–34.0)
MCHC: 30.2 g/dL (ref 30.0–36.0)
MCV: 88.4 fL (ref 80.0–100.0)
Platelets: 191 10*3/uL (ref 150–400)
RBC: 2.92 MIL/uL — ABNORMAL LOW (ref 4.22–5.81)
RDW: 19.4 % — ABNORMAL HIGH (ref 11.5–15.5)
WBC: 7.4 10*3/uL (ref 4.0–10.5)
nRBC: 0 % (ref 0.0–0.2)

## 2023-01-26 LAB — GLUCOSE, CAPILLARY
Glucose-Capillary: 148 mg/dL — ABNORMAL HIGH (ref 70–99)
Glucose-Capillary: 207 mg/dL — ABNORMAL HIGH (ref 70–99)
Glucose-Capillary: 248 mg/dL — ABNORMAL HIGH (ref 70–99)
Glucose-Capillary: 242 mg/dL — ABNORMAL HIGH (ref 70–99)

## 2023-01-26 LAB — MAGNESIUM: Magnesium: 2 mg/dL (ref 1.7–2.4)

## 2023-01-26 MED ORDER — POTASSIUM CHLORIDE CRYS ER 20 MEQ PO TBCR
40.0000 meq | EXTENDED_RELEASE_TABLET | Freq: Once | ORAL | Status: AC
  Administered 2023-01-26: 40 meq via ORAL
  Filled 2023-01-26: qty 2

## 2023-01-26 MED ORDER — FUROSEMIDE 20 MG PO TABS
20.0000 mg | ORAL_TABLET | Freq: Every day | ORAL | Status: DC
  Administered 2023-01-26 – 2023-01-28 (×3): 20 mg via ORAL
  Filled 2023-01-26 (×3): qty 1

## 2023-01-26 NOTE — Progress Notes (Signed)
PROGRESS NOTE  Jim Ramirez  ZOX:[id number] DOB: July 21, 1951 DOA: 12/24/2022 PCP: [name], MD   Brief Narrative:  Patient is a 72 year old male with history of  hypertension and diabetes who initially presented to [facility] on 12/22/2022 for chronic back pain radiating both her legs, with some shortness of breath that started 1 week prior to admission. CT scan of the abdomen /pelvis showed lumbar spine discitis and osteomyelitis, transferred to [facility] . He was found to be bacteremic with staph aureus with necrosis of the lumbar spine, also in A-fib with RVR, neurology, ID and cardiology were consulted. MRI of the wrist was done that showed fluid collection and surgery was consulted who recommended conservative management. Despite IV antibiotics, he continued to spike fevers.  MRI lumbar spine done on 6/7 showed worsening of infection/concerns of epidural abscess .Initial blood cultures at [facility] grew MSSA. TEE from 6/4 does not show any vegetation but MRI does show small fluid collection.Infectious disease recommended IV nafcillin from negative cultures on 5/28 until 01/10/2023, then switched to cefazolin 2 g IV every 8 hourly for additional 6 weeks, thereafter cefadroxil 1000 mg twice daily (starting 7/24) for at least 10 months.   Due to progressive low back pain radiating down legs, MRI repeated 6/7 showing persistent L4, L5 osteonecrosis, probable L4/5 discitis with right L4-5 septic facet, multiple psoas intramuscular loculated fluid collections. Despite continued IV antibiotics, a repeat MRI 6/18 showing new epidural abscess and stenosis. S/P   left L3-4, L4-5 and L5-S1 laminotomy/foraminotomies for drainage of epidural abscess and decompression of the thecal sac by Dr. Lovell Sheehan on 01/20/2023 . PT/OT recommending SNF on discharge.TOC following.  Medically stable for discharge whenever possible  Assessment & Plan:  Principal Problem:   Discitis of lumbar region Active Problems:    Diabetes mellitus type 2 in nonobese (HCC)   Dyslipidemia   Essential hypertension   New onset atrial fibrillation (HCC)   Sepsis (HCC)   BPH (benign prostatic hyperplasia)   Mood disorder (HCC)   MSSA bacteremia   Malnutrition of moderate degree  Epidural abscess: Presented with acute exacerbation of chronic back pain.  Hospital course was remarkable for fever.  MRI was concerning for epidural abscess.  Initial blood cultures at [facility] showed MSSA, blood culture sent here did not show any growth.  ID was following ,cefazolin 2 g IV every 8 hourly for additional 6 weeks, thereafter cefadroxil 1000 mg twice daily (starting 7/24) for at least 10 months. S/P   left L3-4, L4-5 and L5-S1 laminotomy/foraminotomies for drainage of epidural abscess and decompression of the thecal sac by Dr. Lovell Sheehan on 01/20/2023 . Follow-up wound culture: no growth till date,no organisms seen.  Currently hemodynamically stable, afebrile.  Left wrist edema/concern for tenosynovitis: - MRI shows persistent and worsening abscess of the right wrist, [facility] was consulted, status post bedside aspiration 6/10 but not enough sample.   Planning to continue antibiotics.   Acute kidney injury: Resolved.  Hypokalemia/hypomagnesemia: Currently being supplemented and monitored   Osteonecrosis of L4-L5: - Continue supportive care    New onset paroxysmal atrial fibrillation: - Continue amiodarone, now down to 200mg  daily, remains in NSR. - Eliquis was held for neurosurgery,resumed on 6/27 -EKG monitor on 6/27 showed sinus tach -Continue low-dose metoprolol   Iron deficiency anemia:  - Continue iron supplements  -Hb in the range of 7.  Will transfuse if it falls below 7.   Elevate troponin: -Likely demand ischemia.   -No chest pain.  Constipation: Continue bowel regimen,having regular BMs   BPH: -Continue Flomax.   Diabetes mellitus type 2 with hyperglycemia: -A1c of 8.1% - Continue current insulin  regimen   Obstructive sleep apnea: -Continue CPAP at night.   Hyperlipidemia:  - Continue statin   Mood disorder: - Continue lexapro daily, risperdal qHS   Moderate protein caloric malnutrition: - Supplement with protein.   LLE ACUTE DVT : Bilateral lower extremity duplex showed acute DVT involving left gastrocnemius, left posterior tibial veins.  IR consulted ,sp  IVC filter placement.resumed Eliquis on 6/27 as per neurosurgery recommendation .    Lower extremity edema: Has bilateral lower EXTR edema.  BNP normal.  Remains on room air.  Lungs are clear on auscultation.    This is most likely secondary to his low albumin.  Continue compression stockings on lower extremities.  Continue low-dose Lasix   Goals of care/disposition: - Patient has already been seen by palliative care.  Currently DNR but continue full treatment. PT/OT recommending SNF on discharge  Nutrition Problem: Moderate Malnutrition Etiology: acute illness Pressure Injury 01/03/23 Buttocks Right;Left Stage 2 -  Partial thickness loss of dermis presenting as a shallow open injury with a red, pink wound bed without slough. 1x1 (Active)  01/03/23 2100  Location: Buttocks  Location Orientation: Right;Left  Staging: Stage 2 -  Partial thickness loss of dermis presenting as a shallow open injury with a red, pink wound bed without slough.  Wound Description (Comments): 1x1  Present on Admission: No  Dressing Type Foam - Lift dressing to assess site every shift 01/26/23 0739     Pressure Injury 01/18/23 Heel Left Stage 1 -  Intact skin with non-blanchable redness of a localized area usually over a bony prominence. (Active)  01/18/23 1337  Location: Heel  Location Orientation: Left  Staging: Stage 1 -  Intact skin with non-blanchable redness of a localized area usually over a bony prominence.  Wound Description (Comments):   Present on Admission:   Dressing Type Foam - Lift dressing to assess site every shift 01/26/23  0739     Pressure Injury 01/19/23 Heel Right Stage 1 -  Intact skin with non-blanchable redness of a localized area usually over a bony prominence. (Active)  01/19/23 1007  Location: Heel  Location Orientation: Right  Staging: Stage 1 -  Intact skin with non-blanchable redness of a localized area usually over a bony prominence.  Wound Description (Comments):   Present on Admission:   Dressing Type Foam - Lift dressing to assess site every shift 01/26/23 0739    DVT prophylaxis:SCD's Start: 01/20/23 2247 SCDs Start: 12/24/22 1740 apixaban (ELIQUIS) tablet 5 mg     Code Status: DNR  Family Communication: Sister at bedside on 6/26  Patient status:Inpatient  Patient is from :Home  Anticipated discharge to:SNF  Estimated DC date:whenever possible   Consultants: Neurosurgery, ID, IR  Procedures: Laminectomies,IVC filter placement  Antimicrobials:  Anti-infectives (From admission, onward)    Start     Dose/Rate Route Frequency Ordered Stop   02/19/23 1000  cefadroxil (DURICEF) capsule 1,000 mg        1,000 mg Oral 2 times daily 01/13/23 1117 11/26/23 0959   01/20/23 2345  ceFAZolin (ANCEF) IVPB 2g/100 mL premix  Status:  Discontinued        2 g 200 mL/hr over 30 Minutes Intravenous Every 8 hours 01/20/23 2246 01/20/23 2249   01/20/23 1630  ceFAZolin (ANCEF) IVPB 2g/100 mL premix        2 g  200 mL/hr over 30 Minutes Intravenous On call to O.R. 01/20/23 1607 01/20/23 1813   01/11/23 0600  ceFAZolin (ANCEF) IVPB 2g/100 mL premix        2 g 200 mL/hr over 30 Minutes Intravenous Every 8 hours 12/31/22 1449 02/18/23 2359   12/28/22 1015  nafcillin 12 g in sodium chloride 0.9 % 500 mL continuous infusion        12 g 20.8 mL/hr over 24 Hours Intravenous Every 24 hours 12/28/22 0925 01/11/23 1245   12/28/22 0830  nafcillin 2 g in sodium chloride 0.9 % 100 mL IVPB  Status:  Discontinued        2 g 216 mL/hr over 30 Minutes Intravenous Every 4 hours 12/28/22 0736 12/28/22 0925    12/26/22 1400  ceFAZolin (ANCEF) IVPB 2g/100 mL premix  Status:  Discontinued        2 g 200 mL/hr over 30 Minutes Intravenous Every 8 hours 12/26/22 0823 12/28/22 0736   12/25/22 2200  vancomycin (VANCOREADY) IVPB 1750 mg/350 mL  Status:  Discontinued        1,750 mg 175 mL/hr over 120 Minutes Intravenous Every 24 hours 12/25/22 0919 12/26/22 0823   12/25/22 1200  cefTRIAXone (ROCEPHIN) 2 g in sodium chloride 0.9 % 100 mL IVPB  Status:  Discontinued        2 g 200 mL/hr over 30 Minutes Intravenous Every 24 hours 12/25/22 0915 12/25/22 0935   12/24/22 2000  ceFEPIme (MAXIPIME) 2 g in sodium chloride 0.9 % 100 mL IVPB  Status:  Discontinued        2 g 200 mL/hr over 30 Minutes Intravenous Every 8 hours 12/24/22 1856 12/25/22 0837   12/24/22 2000  vancomycin (VANCOREADY) IVPB 1750 mg/350 mL  Status:  Discontinued        1,750 mg 175 mL/hr over 120 Minutes Intravenous Every 24 hours 12/24/22 1857 12/25/22 0837   12/24/22 1930  metroNIDAZOLE (FLAGYL) IVPB 500 mg  Status:  Discontinued        500 mg 100 mL/hr over 60 Minutes Intravenous Every 12 hours 12/24/22 1830 12/25/22 0837       Subjective: Patient seen and examined the bedside today.  Hemodynamically stable.  Lying in bed.  Comfortable.  He continues to have lower extremity swelling this is most likely secondary to low albumin.  Not responding to Lasix.  Having 3-4 bowel movements a day.  Heart rate well-controlled.  Blood pressure stable  Objective: Vitals:   01/25/23 2305 01/26/23 0335 01/26/23 0739 01/26/23 0939  BP: 111/67 118/64  117/76  Pulse: 78 85    Resp: 19 13    Temp: 100.2 F (37.9 C) 98.2 F (36.8 C) (!) 97.3 F (36.3 C)   TempSrc:   Oral   SpO2: 100% 100%    Weight:      Height:        Intake/Output Summary (Last 24 hours) at 01/26/2023 1023 Last data filed at 01/26/2023 0600 Gross per 24 hour  Intake 1070 ml  Output 2000 ml  Net -930 ml   Filed Weights   01/05/23 0346 01/14/23 1241 01/19/23 0800   Weight: 86.5 kg 91.3 kg 91.3 kg    Examination:  General exam: Overall comfortable, not in distress, weak and deconditioned HEENT: PERRL Respiratory system:  no wheezes or crackles  Cardiovascular system: S1 & S2 heard, RRR.  Gastrointestinal system: Abdomen is nondistended, soft and nontender. Central nervous system: Alert and oriented Extremities: Bilateral lower extremity pitting edema, no  clubbing ,no cyanosis Skin: No rashes, no ulcers,no icterus    Data Reviewed: I have personally reviewed following labs and imaging studies  CBC: Recent Labs  Lab 01/22/23 0520 01/23/23 0515 01/24/23 0500 01/25/23 0445 01/26/23 0340  WBC 10.8* 8.7 7.9 7.6 7.4  HGB 8.1* 8.1* 7.6* 7.5* 7.8*  HCT 26.6* 26.3* 25.2* 24.8* 25.8*  MCV 86.1 88.9 89.4 89.9 88.4  PLT 205 160 151 184 191   Basic Metabolic Panel: Recent Labs  Lab 01/21/23 0440 01/22/23 0520 01/25/23 0445 01/26/23 0340  NA 135 136 133* 134*  K 4.3 3.6 2.9* 3.3*  CL 101 102 99 99  CO2 26 28 27 28   GLUCOSE 220* 190* 190* 155*  BUN 13 9 10 9   CREATININE 0.68 0.70 0.71 0.82  CALCIUM 8.2* 8.1* 7.9* 8.3*  MG  --   --  1.6* 2.0     Recent Results (from the past 240 hour(s))  Surgical pcr screen     Status: None   Collection Time: 01/20/23  7:19 AM   Specimen: Nasal Mucosa; Nasal Swab  Result Value Ref Range Status   MRSA, PCR NEGATIVE NEGATIVE Final   Staphylococcus aureus NEGATIVE NEGATIVE Final    Comment: (NOTE) The Xpert SA Assay (FDA approved for NASAL specimens in patients 20 years of age and older), is one component of a comprehensive surveillance program. It is not intended to diagnose infection nor to guide or monitor treatment. Performed at [facility], [address] [place], [place] [address]   Aerobic/Anaerobic Culture w Gram Stain (surgical/deep wound)     Status: None   Collection Time: 01/20/23  7:01 PM   Specimen: Wound; Abscess  Result Value Ref Range Status   Specimen Description WOUND   Final   Special Requests LUMBAR EPIDURAL SPACE  Final   Gram Stain   Final    FEW WBC PRESENT,BOTH PMN AND MONONUCLEAR NO ORGANISMS SEEN    Culture   Final    No growth aerobically or anaerobically. Performed at [facility], [address], [place], [place] [address]    Report Status 01/25/2023 FINAL  Final  Aerobic/Anaerobic Culture w Gram Stain (surgical/deep wound)     Status: None   Collection Time: 01/20/23  7:27 PM   Specimen: Abscess  Result Value Ref Range Status   Specimen Description ABSCESS  Final   Special Requests EPIDURAL ABSCESS  Final   Gram Stain   Final    RARE WBC PRESENT,BOTH PMN AND MONONUCLEAR NO ORGANISMS SEEN    Culture   Final    No growth aerobically or anaerobically. Performed at [facility], [address] [place], [place] [address]    Report Status 01/25/2023 FINAL  Final     Radiology Studies: No results found.  Scheduled Meds:  amiodarone  200 mg Oral Daily   apixaban  5 mg Oral BID   azelastine  1 spray Each Nare BID   [START ON 02/19/2023] cefadroxil  1,000 mg Oral BID   Chlorhexidine Gluconate Cloth  6 each Topical Daily   diclofenac Sodium  2 g Topical QID   docusate sodium  100 mg Oral BID   escitalopram  10 mg Oral Daily   ferrous sulfate  325 mg Oral Q breakfast   fluticasone  2 spray Each Nare Daily   furosemide  20 mg Oral Daily   gabapentin  100 mg Oral BID   insulin aspart  0-15 Units Subcutaneous TID WC  insulin aspart  0-5 Units Subcutaneous QHS   insulin glargine-yfgn  15 Units Subcutaneous QHS   ketotifen  1 drop Left Eye BID   lidocaine  2 patch Transdermal Q24H   loratadine  10 mg Oral Daily   melatonin  3 mg Oral QHS   metoprolol tartrate  12.5 mg Oral BID   multivitamin with minerals  1 tablet Oral Daily   pantoprazole  40 mg Oral BID   risperiDONE  0.5 mg Oral QHS   rosuvastatin  20 mg Oral Daily   senna  2 tablet Oral Daily   tamsulosin  0.4 mg Oral Daily   Continuous Infusions:   ceFAZolin  (ANCEF) IV 2 g (01/26/23 0536)     LOS: 33 days   Burnadette Pop, MD [facility] P6/30/2024, 10:23 AM

## 2023-01-26 NOTE — Plan of Care (Signed)
Problem: Education: Goal: Knowledge of General Education information will improve Description: Including pain rating scale, medication(s)/side effects and non-pharmacologic comfort measures Outcome: Progressing   Problem: Health Behavior/Discharge Planning: Goal: Ability to manage health-related needs will improve Outcome: Progressing   Problem: Clinical Measurements: Goal: Ability to maintain clinical measurements within normal limits will improve Outcome: Progressing Goal: Will remain free from infection Outcome: Progressing Goal: Diagnostic test results will improve Outcome: Progressing Goal: Respiratory complications will improve Outcome: Progressing Goal: Cardiovascular complication will be avoided Outcome: Progressing   Problem: Activity: Goal: Risk for activity intolerance will decrease Outcome: Progressing   Problem: Nutrition: Goal: Adequate nutrition will be maintained Outcome: Progressing   Problem: Coping: Goal: Level of anxiety will decrease Outcome: Progressing   Problem: Elimination: Goal: Will not experience complications related to bowel motility Outcome: Progressing Goal: Will not experience complications related to urinary retention Outcome: Progressing   Problem: Pain Managment: Goal: General experience of comfort will improve Outcome: Progressing   Problem: Safety: Goal: Ability to remain free from injury will improve Outcome: Progressing   Problem: Skin Integrity: Goal: Risk for impaired skin integrity will decrease Outcome: Progressing   Problem: Education: Goal: Knowledge of disease or condition will improve Outcome: Progressing Goal: Understanding of medication regimen will improve Outcome: Progressing Goal: Individualized Educational Video(s) Outcome: Progressing   Problem: Activity: Goal: Ability to tolerate increased activity will improve Outcome: Progressing   Problem: Cardiac: Goal: Ability to achieve and maintain  adequate cardiopulmonary perfusion will improve Outcome: Progressing   Problem: Health Behavior/Discharge Planning: Goal: Ability to safely manage health-related needs after discharge will improve Outcome: Progressing   Problem: Education: Goal: Ability to describe self-care measures that may prevent or decrease complications (Diabetes Survival Skills Education) will improve Outcome: Progressing Goal: Individualized Educational Video(s) Outcome: Progressing   Problem: Coping: Goal: Ability to adjust to condition or change in health will improve Outcome: Progressing   Problem: Fluid Volume: Goal: Ability to maintain a balanced intake and output will improve Outcome: Progressing   Problem: Health Behavior/Discharge Planning: Goal: Ability to identify and utilize available resources and services will improve Outcome: Progressing Goal: Ability to manage health-related needs will improve Outcome: Progressing   Problem: Metabolic: Goal: Ability to maintain appropriate glucose levels will improve Outcome: Progressing   Problem: Nutritional: Goal: Maintenance of adequate nutrition will improve Outcome: Progressing Goal: Progress toward achieving an optimal weight will improve Outcome: Progressing   Problem: Skin Integrity: Goal: Risk for impaired skin integrity will decrease Outcome: Progressing   Problem: Tissue Perfusion: Goal: Adequacy of tissue perfusion will improve Outcome: Progressing   Problem: Education: Goal: Understanding of CV disease, CV risk reduction, and recovery process will improve Outcome: Progressing Goal: Individualized Educational Video(s) Outcome: Progressing   Problem: Activity: Goal: Ability to return to baseline activity level will improve Outcome: Progressing   Problem: Cardiovascular: Goal: Ability to achieve and maintain adequate cardiovascular perfusion will improve Outcome: Progressing Goal: Vascular access site(s) Level 0-1 will be  maintained Outcome: Progressing   Problem: Health Behavior/Discharge Planning: Goal: Ability to safely manage health-related needs after discharge will improve Outcome: Progressing   Problem: Education: Goal: Required Educational Video(s) Outcome: Progressing   Problem: Clinical Measurements: Goal: Ability to maintain clinical measurements within normal limits will improve Outcome: Progressing Goal: Postoperative complications will be avoided or minimized Outcome: Progressing   Problem: Skin Integrity: Goal: Demonstration of wound healing without infection will improve Outcome: Progressing   Problem: Education: Goal: Ability to verbalize activity precautions or restrictions will improve Outcome: Progressing Goal: Knowledge  of the prescribed therapeutic regimen will improve Outcome: Progressing Goal: Understanding of discharge needs will improve Outcome: Progressing

## 2023-01-26 NOTE — Progress Notes (Signed)
Requested for the compression stocking to be placed tonight.

## 2023-01-27 LAB — BASIC METABOLIC PANEL
Anion gap: 8 (ref 5–15)
BUN: 9 mg/dL (ref 8–23)
CO2: 27 mmol/L (ref 22–32)
Calcium: 8.2 mg/dL — ABNORMAL LOW (ref 8.9–10.3)
Chloride: 100 mmol/L (ref 98–111)
Creatinine, Ser: 0.81 mg/dL (ref 0.61–1.24)
GFR, Estimated: >60 mL/min (ref >60)
Glucose, Bld: 219 mg/dL — ABNORMAL HIGH (ref 70–99)
Potassium: 3.3 mmol/L — ABNORMAL LOW (ref 3.5–5.1)
Sodium: 135 mmol/L (ref 135–145)

## 2023-01-27 LAB — GLUCOSE, CAPILLARY
Glucose-Capillary: 191 mg/dL — ABNORMAL HIGH (ref 70–99)
Glucose-Capillary: 203 mg/dL — ABNORMAL HIGH (ref 70–99)
Glucose-Capillary: 209 mg/dL — ABNORMAL HIGH (ref 70–99)

## 2023-01-27 MED ORDER — POTASSIUM CHLORIDE CRYS ER 20 MEQ PO TBCR
40.0000 meq | EXTENDED_RELEASE_TABLET | Freq: Once | ORAL | Status: AC
  Administered 2023-01-27: 40 meq via ORAL
  Filled 2023-01-27: qty 2

## 2023-01-27 MED ORDER — FUROSEMIDE 20 MG PO TABS: 20.0000 mg | ORAL_TABLET | Freq: Every day | ORAL | Status: DC

## 2023-01-27 MED ORDER — METOPROLOL TARTRATE 25 MG PO TABS: 12.5000 mg | ORAL_TABLET | Freq: Two times a day (BID) | ORAL | Status: DC

## 2023-01-27 MED ORDER — LIDOCAINE 5 % EX PTCH: 2.0000 | MEDICATED_PATCH | CUTANEOUS | 0 refills | Status: DC

## 2023-01-27 MED ORDER — FERROUS SULFATE 325 (65 FE) MG PO TABS: 325.0000 mg | ORAL_TABLET | Freq: Every day | ORAL | 3 refills | Status: DC

## 2023-01-27 MED ORDER — GABAPENTIN 100 MG PO CAPS: 100.0000 mg | ORAL_CAPSULE | Freq: Two times a day (BID) | ORAL | Status: DC

## 2023-01-27 MED ORDER — APIXABAN 5 MG PO TABS: 5.0000 mg | ORAL_TABLET | Freq: Two times a day (BID) | ORAL | Status: DC

## 2023-01-27 MED ORDER — POTASSIUM CHLORIDE CRYS ER 20 MEQ PO TBCR: 20.0000 meq | EXTENDED_RELEASE_TABLET | Freq: Every day | ORAL | 0 refills | Status: DC

## 2023-01-27 MED ORDER — OXYCODONE HCL 5 MG PO TABS: 5.0000 mg | ORAL_TABLET | ORAL | 0 refills | Status: DC | PRN

## 2023-01-27 MED ORDER — INSULIN ASPART 100 UNIT/ML IJ SOLN
3.0000 [IU] | Freq: Three times a day (TID) | INTRAMUSCULAR | Status: DC
  Administered 2023-01-28 (×2): 3 [IU] via SUBCUTANEOUS

## 2023-01-27 MED ORDER — CEFADROXIL 500 MG PO CAPS: 1000.0000 mg | ORAL_CAPSULE | Freq: Two times a day (BID) | ORAL | Status: DC

## 2023-01-27 MED ORDER — GERHARDT'S BUTT CREAM: 1.0000 | TOPICAL_CREAM | Freq: Every day | CUTANEOUS | Status: DC | PRN

## 2023-01-27 MED ORDER — AMIODARONE HCL 200 MG PO TABS: 200.0000 mg | ORAL_TABLET | Freq: Every day | ORAL | Status: DC

## 2023-01-27 MED ORDER — INSULIN GLARGINE-YFGN 100 UNIT/ML ~~LOC~~ SOLN: 15.0000 [IU] | Freq: Every day | SUBCUTANEOUS | 11 refills | Status: DC

## 2023-01-27 MED ORDER — HEPARIN SOD (PORK) LOCK FLUSH 10 UNIT/ML IV SOLN
10.0000 [IU] | INTRAVENOUS | Status: DC | PRN
Start: 2023-01-27 — End: 2023-01-27

## 2023-01-27 MED ORDER — METHOCARBAMOL 500 MG PO TABS: 500.0000 mg | ORAL_TABLET | Freq: Three times a day (TID) | ORAL | Status: DC | PRN

## 2023-01-27 MED ORDER — CEFAZOLIN IV (FOR PTA / DISCHARGE USE ONLY)
2.0000 g | Freq: Three times a day (TID) | INTRAVENOUS | 0 refills | Status: DC
Start: 2023-01-27 — End: 2023-02-26

## 2023-01-27 MED ORDER — DICLOFENAC SODIUM 1 % EX GEL: 2.0000 g | Freq: Four times a day (QID) | CUTANEOUS | Status: DC

## 2023-01-27 MED ORDER — HEPARIN SOD (PORK) LOCK FLUSH 100 UNIT/ML IV SOLN
250.0000 [IU] | INTRAVENOUS | Status: DC | PRN
  Filled 2023-01-27: qty 2.5

## 2023-01-27 MED ORDER — RISPERIDONE 0.5 MG PO TABS: 0.5000 mg | ORAL_TABLET | Freq: Every day | ORAL | Status: DC

## 2023-01-27 MED ORDER — INSULIN ASPART 100 UNIT/ML IJ SOLN: 3.0000 [IU] | Freq: Three times a day (TID) | INTRAMUSCULAR | 11 refills | Status: DC

## 2023-01-27 NOTE — Progress Notes (Signed)
Mobility Specialist Progress Note:   01/27/23 1532  Mobility  Activity Stood at bedside  Level of Assistance Moderate assist, patient does 50-74%  Assistive Device Front wheel walker  Activity Response Tolerated fair  Mobility Referral Yes  $Mobility charge 1 Mobility  Mobility Specialist Start Time (ACUTE ONLY) 1500  Mobility Specialist Stop Time (ACUTE ONLY) 1525  Mobility Specialist Time Calculation (min) (ACUTE ONLY) 25 min    Post Mobility: 110/70 BP  Pt received in bed, agreeable to mobility. MinA bed mobility. ModA to stand. Heavy knee buckling when standing. Pt bowel incontinent when standing. Assisted with pericare then back to bed with call bell in hand.     Leory Plowman  Mobility Specialist Please contact via [employer] office at [phone]

## 2023-01-27 NOTE — TOC Progression Note (Signed)
Transition of Care [facility]) - Progression Note    Patient Details  Name: BABE KOLENOVIC MRN: [id number] Date of Birth: 1950/09/14  Transition of Care [facility]) CM/SW Contact  Leander Rams, [place] Phone Number: 01/27/2023, 12:12 PM  Clinical Narrative:    Pt has insurance auth for [facility]. Auth is for 7/1 - 7/82 NFAO#[id number]. CSW spoke with Eunice Blase from  [facility] who states they don't have a male bed ready today but will for sure have one tomorrow. Plan is for pt to dc to [facility].   TOC will continue to follow.    Expected Discharge Plan: Skilled Nursing Facility Barriers to Discharge: Continued Medical Work up  Expected Discharge Plan and Services         Expected Discharge Date: 01/27/23                                     Social Determinants of Health (SDOH) Interventions SDOH Screenings   Transportation Needs: No Transportation Needs (12/26/2022)  Utilities: Not At Risk (12/26/2022)  Tobacco Use: Medium Risk (01/21/2023)    Readmission Risk Interventions     No data to display         Oletta Lamas, MSW, LCSWA, LCASA Transitions of Care  Clinical Social Worker I

## 2023-01-27 NOTE — Progress Notes (Addendum)
Very confused  and with hallucination seeing things and friends in his room. With irrelevant answers when talked  to. Md made aware.Redirected , opened blinds in his room.continue to monitor.

## 2023-01-27 NOTE — Progress Notes (Signed)
This chaplain is present for F/U spiritual care as the Pt. prepares for discharge. The chaplain listened reflectively as the Pt. plans his next hiking trip and begins to understand what may be involved in therapy. The Pt is energetic and concerned about keeping his balance as he gets stronger. The chaplain understands family visited over the weekend and will continue to support him.   This chaplain is available for F/U spiritual care as needed.  Chaplain Stephanie Acre [phone]

## 2023-01-27 NOTE — Progress Notes (Signed)
Off telemetry ready for discharge.

## 2023-01-27 NOTE — Progress Notes (Signed)
Compression stocking applied bil. Tolerated well .

## 2023-01-27 NOTE — Discharge Summary (Addendum)
Physician Discharge Summary  XYLAN LAMPSHIRE ZOX:[id number] DOB: 02-18-1951 DOA: 12/24/2022  PCP: [name], MD  Admit date: 12/24/2022 Discharge date: 01/28/2023  Admitted From: Home Disposition:SnF  Discharge Condition:Stable CODE STATUS:DNR Diet recommendation:  Carb Modified    Brief/Interim Summary: Patient is a 72 year old male with history of  hypertension and diabetes who initially presented to [facility] on 12/22/2022 for chronic back pain radiating both her legs, with some shortness of breath that started 1 week prior to admission. CT scan of the abdomen /pelvis showed lumbar spine discitis and osteomyelitis, transferred to [facility] . He was found to be bacteremic with staph aureus with necrosis of the lumbar spine, also in A-fib with RVR, neurology, ID and cardiology were consulted. MRI of the wrist was done that showed fluid collection and surgery was consulted who recommended conservative management. Despite IV antibiotics, he continued to spike fevers.  MRI lumbar spine done on 6/7 showed worsening of infection/concerns of epidural abscess .Initial blood cultures at [facility] grew MSSA. TEE from 6/4 does not show any vegetation but MRI does show small fluid collection.Infectious disease recommended IV nafcillin from negative cultures on 5/28 until 01/10/2023, then switched to cefazolin 2 g IV every 8 hourly for additional 6 weeks, thereafter cefadroxil 1000 mg twice daily (starting 7/24) for at least 10 months.   Due to progressive low back pain radiating down legs, MRI repeated 6/7 showing persistent L4, L5 osteonecrosis, probable L4/5 discitis with right L4-5 septic facet, multiple psoas intramuscular loculated fluid collections. Despite continued IV antibiotics, a repeat MRI 6/18 showing new epidural abscess and stenosis. S/P   left L3-4, L4-5 and L5-S1 laminotomy/foraminotomies for drainage of epidural abscess and decompression of the thecal sac by Dr. Lovell Sheehan on 01/20/2023 . PT/OT  recommending SNF on discharge.TOC following.  Medically stable for discharge whenever possible  Following problems were addressed during the hospitalization:  Epidural abscess: Presented with acute exacerbation of chronic back pain.  Hospital course was remarkable for fever.  MRI was concerning for epidural abscess.  Initial blood cultures at [facility] showed MSSA, blood culture sent here did not show any growth.  ID was following ,cefazolin 2 g IV every 8 hourly for additional 6 weeks, thereafter cefadroxil 1000 mg twice daily (starting 7/24) for at least 10 months. S/P   left L3-4, L4-5 and L5-S1 laminotomy/foraminotomies for drainage of epidural abscess and decompression of the thecal sac by Dr. Lovell Sheehan on 01/20/2023 . Follow-up wound culture: no growth till date,no organisms seen.  Currently hemodynamically stable, afebrile.He will follow-up with neurosurgery as an outpatient in 2 weeks He needs to wear  lumbosacral corset while out of bed.   Left wrist edema/concern for tenosynovitis: - MRI shows persistent and worsening abscess of the right wrist, [facility] was consulted, status post bedside aspiration 6/10 but not enough sample.   Planning to continue antibiotics.   Acute kidney injury: Resolved.   Hypokalemia: Currently being supplemented    Osteonecrosis of L4-L5: - Continue supportive care    New onset paroxysmal atrial fibrillation: - Continue amiodarone, now down to 200mg  daily, remains in NSR. - Eliquis was held for neurosurgery,resumed on 6/27 -EKG monitor on 6/27 showed sinus tach -Continue low-dose metoprolol   Iron deficiency anemia:  - Continue iron supplements  -Hb in the range of 7.  Check CBC in a week's   Elevate troponin: -Likely demand ischemia.   -No chest pain.     Constipation: Continue bowel regimen,having regular BMs   BPH: -Continue Flomax.  Diabetes mellitus type 2 with hyperglycemia: -A1c of 8.1% - Continue current insulin regimen    Obstructive sleep apnea: -Continue CPAP at night.   Hyperlipidemia:  - Continue statin   Mood disorder: - Continue lexapro daily, risperdal qHS   Moderate protein caloric malnutrition: - Supplement with protein.   LLE ACUTE DVT : Bilateral lower extremity duplex showed acute DVT involving left gastrocnemius, left posterior tibial veins.  IR consulted ,sp  IVC filter placement.resumed Eliquis on 6/27 as per neurosurgery recommendation .     Lower extremity edema: Has bilateral lower EXTR edema.  BNP normal.  Remains on room air.  Lungs are clear on auscultation.    This is most likely secondary to his low albumin.  Continue compression stockings on lower extremities.  Continue low-dose Lasix   Goals of care/disposition: - Patient has already been seen by palliative care.  Currently DNR but continue full treatment. PT/OT recommending SNF on discharge   Discharge Diagnoses:  Principal Problem:   Discitis of lumbar region Active Problems:   Diabetes mellitus type 2 in nonobese (HCC)   Dyslipidemia   Essential hypertension   New onset atrial fibrillation (HCC)   Sepsis (HCC)   BPH (benign prostatic hyperplasia)   Mood disorder (HCC)   MSSA bacteremia   Malnutrition of moderate degree    Discharge Instructions  Discharge Instructions     [facility] pharmacist to adjust dose for Vancomycin, Aminoglycosides and other anti-infective therapies as requested by physician.   Complete by: As directed    [facility] to provide Cath Flo 2mg    Complete by: As directed    Administer for PICC line occlusion and as ordered by physician for other access device issues.   Amb referral to [facility]   Complete by: As directed    Anaphylaxis Kit: Provided to treat any anaphylactic reaction to the medication being provided to the patient if First Dose or when requested by physician   Complete by: As directed    Epinephrine 1mg /ml vial / amp: Administer 0.3mg  (0.84ml)  subcutaneously once for moderate to severe anaphylaxis, nurse to call physician and pharmacy when reaction occurs and call 911 if needed for immediate care   Diphenhydramine 50mg /ml IV vial: Administer 25-50mg  IV/IM PRN for first dose reaction, rash, itching, mild reaction, nurse to call physician and pharmacy when reaction occurs   Sodium Chloride 0.9% NS IV: Administer if needed for hypovolemic blood pressure drop or as ordered by physician after call to physician with anaphylactic reaction   Change dressing on IV access line weekly and PRN   Complete by: As directed    Diet Carb Modified   Complete by: As directed    Discharge instructions   Complete by: As directed    1)Please take prescribed medications as instructed 2)Follow up with infectious disease, neurosurgery as an outpatient.  Name and number of the providers have been attached 3)Do a CBC and BMP testing in  a week   Flush IV access with Sodium Chloride 0.9% and Heparin 10 units/ml or 100 units/ml   Complete by: As directed    Home infusion instructions - [facility]   Complete by: As directed    Instructions: Flush IV access with Sodium Chloride 0.9% and Heparin 10units/ml or 100units/ml   Change dressing on IV access line: Weekly and PRN   Instructions Cath Flo 2mg : Administer for PICC Line occlusion and as ordered by physician for other access device   [facility] pharmacist to adjust dose for: Vancomycin, Aminoglycosides and other anti-infective therapies as requested by physician   Increase activity slowly   Complete by: As directed    Method of administration may be changed at the discretion of home infusion pharmacist based upon assessment of the patient and/or caregiver's ability to self-administer the medication ordered   Complete by: As directed    No wound care   Complete by: As directed       Allergies as of 01/28/2023   No Known Allergies      Medication List     STOP taking  these medications    celecoxib 200 MG capsule Commonly known as: CELEBREX   hydrochlorothiazide 25 MG tablet Commonly known as: HYDRODIURIL   HYDROcodone-acetaminophen 10-325 MG tablet Commonly known as: NORCO   insulin NPH-regular Human (70-30) 100 UNIT/ML injection   metoprolol succinate 50 MG 24 hr tablet Commonly known as: TOPROL-XL   oxyCODONE-acetaminophen 5-325 MG tablet Commonly known as: Percocet   trandolapril 2 MG tablet Commonly known as: MAVIK   zolpidem 10 MG tablet Commonly known as: AMBIEN       TAKE these medications    amiodarone 200 MG tablet Commonly known as: PACERONE Take 1 tablet (200 mg total) by mouth daily.   apixaban 5 MG Tabs tablet Commonly known as: ELIQUIS Take 1 tablet (5 mg total) by mouth 2 (two) times daily.   azelastine 0.1 % nasal spray Commonly known as: ASTELIN Place 1 spray into both nostrils 2 (two) times daily. Use in each nostril as directed   cefadroxil 500 MG capsule Commonly known as: DURICEF Take 2 capsules (1,000 mg total) by mouth 2 (two) times daily. Continue for long term Start taking on: February 19, 2023   ceFAZolin  IVPB Commonly known as: ANCEF Inject 2 g into the vein every 8 (eight) hours. Indication:  Disseminated MSSA infection First Dose: Yes Last Day of Therapy:  02/18/23 Labs - Once weekly:  CBC/D and BMP, Labs - Once weekly: ESR and CRP Method of administration: IV Push Start on 01/11/23 after completion of Nafcillin CI, pull PICC line after completion of IV antibiotic therapy Method of administration may be changed at the discretion of home infusion pharmacist based upon assessment of the patient and/or caregiver's ability to self-administer the medication ordered.   cyanocobalamin 1000 MCG/ML injection Commonly known as: VITAMIN B12 Inject 1,000 mcg into the muscle every 30 (thirty) days.   diclofenac Sodium 1 % Gel Commonly known as: VOLTAREN Apply 2 g topically 4 (four) times daily.    docusate sodium 100 MG capsule Commonly known as: COLACE Take 1 capsule (100 mg total) by mouth 2 (two) times daily.   empagliflozin 25 MG Tabs tablet Commonly known as: JARDIANCE Take 25 mg by mouth daily.   escitalopram 10 MG tablet Commonly known as: LEXAPRO Take 10 mg by mouth daily.   esomeprazole 40 MG capsule Commonly known as: NEXIUM Take 40 mg by mouth 2 (two) times daily.   ferrous sulfate 325 (65 FE) MG tablet Take 1 tablet (325 mg total) by mouth daily with breakfast.   furosemide 20 MG tablet Commonly known as: LASIX Take 1 tablet (20 mg total) by mouth daily.   gabapentin 100 MG capsule Commonly known as: NEURONTIN Take 1 capsule (100 mg total) by mouth 2 (two) times daily.   Gerhardt's butt cream Crea Apply 1 Application topically daily as needed for irritation.   insulin aspart 100 UNIT/ML injection Commonly known as: novoLOG  Inject 3 Units into the skin 3 (three) times daily with meals. If eats more than 50% of meals   insulin glargine-yfgn 100 UNIT/ML injection Commonly known as: SEMGLEE Inject 0.15 mLs (15 Units total) into the skin at bedtime.   levocetirizine 5 MG tablet Commonly known as: XYZAL Take 5 mg by mouth every evening.   lidocaine 5 % Commonly known as: LIDODERM Place 2 patches onto the skin daily. Remove & Discard patch within 12 hours or as directed by MD   metFORMIN 1000 MG tablet Commonly known as: GLUCOPHAGE Take 1,000 mg by mouth 2 (two) times daily with a meal.   methocarbamol 500 MG tablet Commonly known as: ROBAXIN Take 1 tablet (500 mg total) by mouth every 8 (eight) hours as needed for muscle spasms.   metoprolol tartrate 25 MG tablet Commonly known as: LOPRESSOR Take 0.5 tablets (12.5 mg total) by mouth 2 (two) times daily.   oxyCODONE 5 MG immediate release tablet Commonly known as: Oxy IR/ROXICODONE Take 1 tablet (5 mg total) by mouth every 3 (three) hours as needed for moderate pain ((score 4 to 6)).    potassium chloride SA 20 MEQ tablet Commonly known as: KLOR-CON M Take 1 tablet (20 mEq total) by mouth daily.   risperiDONE 0.5 MG tablet Commonly known as: RISPERDAL Take 1 tablet (0.5 mg total) by mouth at bedtime.   rosuvastatin 20 MG tablet Commonly known as: CRESTOR Take 20 mg by mouth daily.   tamsulosin 0.4 MG Caps capsule Commonly known as: FLOMAX Take 0.4 mg by mouth daily.               Discharge Care Instructions  (From admission, onward)           Start     Ordered   01/27/23 0000  Change dressing on IV access line weekly and PRN  (Home infusion instructions - [facility] )        01/27/23 1040            Follow-up Information     [facility] Follow up.   Specialty: Home Health Services Why: Agency will call you to set up apt times. Contact information: [address] [place] [address] [phone]         Judyann Munson, MD Follow up on 02/18/2023.   Specialty: Infectious Diseases Why: 2:45 pm appointment - please arrive 30 min early to facilitate parking, Hospital Discharge Follow Up Contact information: [address] [place] [address] [phone]         Tressie Stalker, MD. Schedule an appointment as soon as possible for a visit in 2 week(s).   Specialty: Neurosurgery Contact information: [address] [place] [address] [phone]                No Known Allergies  Consultations: Neurosurgery, ID, cardiology   Procedures/Studies: DG Lumbar Spine 1 View  Result Date: 01/20/2023 CLINICAL DATA:  Intraoperative localization image EXAM: LUMBAR SPINE - 1 VIEW COMPARISON:  01/14/2023 FINDINGS: Cross-table lateral view of the lumbar spine was performed. Surgical instrumentation is identified posterior to the L4-5 disc space. IMPRESSION: 1. Localization image, with instrumentation posterior to the L4-5 disc space. Electronically Signed   By:  Sharlet Salina M.D.   On: 01/20/2023 20:34   IR IVC FILTER PLMT / S&I Lenise Arena GUID/MOD SED  Result Date: 01/19/2023 INDICATION: 72 year old male on chronic anticoagulation who is currently hospitalized with an  epidural abscess. His anticoagulation was stopped in order for him to undergo surgical decompression, however DVT ultrasound demonstrates new acute DVT in the calf veins. Therefore, patient requires placement of a retrievable IVC filter for PE prophylaxis in the perioperative timeframe. EXAM: ULTRASOUND GUIDANCE FOR VASCULARACCESS IVC CATHETERIZATION AND VENOGRAM IVC FILTER INSERTION Interventional Radiologist:  Sterling Big, MD MEDICATIONS: None. ANESTHESIA/SEDATION: 0.5 mg Dilaudid administered for pain control FLUOROSCOPY TIME:  Radiation exposure index: 37 mGy reference air kerma COMPLICATIONS: None immediate. PROCEDURE: Informed written consent was obtained from the patient after a thorough discussion of the procedural risks, benefits and alternatives. All questions were addressed. Maximal Sterile Barrier Technique was utilized including caps, mask, sterile gowns, sterile gloves, sterile drape, hand hygiene and skin antiseptic. A timeout was performed prior to the initiation of the procedure. Maximal barrier sterile technique utilized including caps, mask, sterile gowns, sterile gloves, large sterile drape, hand hygiene, and Betadine prep. Under sterile condition and local anesthesia, right internal jugular venous access was performed with ultrasound. An ultrasound image was saved and sent to PACS. Over a guidewire, the IVC filter delivery sheath and inner dilator were advanced into the IVC just above the IVC bifurcation. Contrast injection was performed for an IVC venogram. Through the delivery sheath, a retrievable Bard Denali IVC filter was deployed below the level of the renal veins and above the IVC bifurcation. Limited post deployment venacavagram was performed. The delivery sheath was  removed and hemostasis was obtained with manual compression. A dressing was placed. The patient tolerated the procedure well without immediate post procedural complication. FINDINGS: The IVC is patent. No evidence of thrombus, stenosis, or occlusion. No variant venous anatomy. Successful placement of the IVC filter below the level of the renal veins. IMPRESSION: Successful ultrasound and fluoroscopically guided placement of an infrarenal retrievable IVC filter via right jugular approach. PLAN: This IVC filter is potentially retrievable. The patient will be assessed for filter retrieval by Interventional Radiology in approximately 8-12 weeks. Further recommendations regarding filter retrieval, continued surveillance or declaration of device permanence, will be made at that time. Electronically Signed   By: Malachy Moan M.D.   On: 01/19/2023 11:37   VAS [place] LOWER EXTREMITY VENOUS (DVT)  Result Date: 01/18/2023  Lower Venous DVT Study Patient Name:  MICHAELL IGLEHEART  Date of Exam:   01/18/2023 Medical Rec #: [id number]         Accession #:    [id number] Date of Birth: 1951-06-18         Patient Gender: M Patient Age:   63 years Exam Location:  [facility] Procedure:      VAS [place] LOWER EXTREMITY VENOUS (DVT) Referring Phys: Huntley Dec TOMLINSON --------------------------------------------------------------------------------  Indications: Edema.  Risk Factors: DVT 2022 RLE (peroneal vein). Comparison Study: Previous exam on 12/28/22 was negative for DVT. Performing Technologist: Ernestene Mention RVT, RDMS  Examination Guidelines: A complete evaluation includes B-mode imaging, spectral Doppler, color Doppler, and power Doppler as needed of all accessible portions of each vessel. Bilateral testing is considered an integral part of a complete examination. Limited examinations for reoccurring indications may be performed as noted. The reflux portion of the exam is performed with the patient in reverse Trendelenburg.   +---------+---------------+---------+-----------+----------+--------------+ RIGHT    CompressibilityPhasicitySpontaneityPropertiesThrombus Aging +---------+---------------+---------+-----------+----------+--------------+ CFV      Full           Yes      No                                  +---------+---------------+---------+-----------+----------+--------------+  SFJ      Full                                                        +---------+---------------+---------+-----------+----------+--------------+ FV Prox  Full           Yes      Yes                                 +---------+---------------+---------+-----------+----------+--------------+ FV Mid   Full           Yes      Yes                                 +---------+---------------+---------+-----------+----------+--------------+ FV DistalFull           Yes      Yes                                 +---------+---------------+---------+-----------+----------+--------------+ PFV      Full                                                        +---------+---------------+---------+-----------+----------+--------------+ POP      Full           Yes      Yes                                 +---------+---------------+---------+-----------+----------+--------------+ PTV      Full                                                        +---------+---------------+---------+-----------+----------+--------------+ PERO     Full                                                        +---------+---------------+---------+-----------+----------+--------------+   +---------+---------------+---------+-----------+----------+------------------+ LEFT     CompressibilityPhasicitySpontaneityPropertiesThrombus Aging     +---------+---------------+---------+-----------+----------+------------------+ CFV      Full           Yes      Yes                                      +---------+---------------+---------+-----------+----------+------------------+ SFJ      Full                                                            +---------+---------------+---------+-----------+----------+------------------+  FV Prox  Full           Yes      Yes                                     +---------+---------------+---------+-----------+----------+------------------+ FV Mid   Full           Yes      Yes                                     +---------+---------------+---------+-----------+----------+------------------+ FV DistalFull           Yes      Yes                                     +---------+---------------+---------+-----------+----------+------------------+ PFV      Full                                                            +---------+---------------+---------+-----------+----------+------------------+ POP      Full           Yes      Yes                                     +---------+---------------+---------+-----------+----------+------------------+ PTV      None           No       No                   Acute              +---------+---------------+---------+-----------+----------+------------------+ PERO     Full                                                            +---------+---------------+---------+-----------+----------+------------------+ Gastroc  None           No       No                   acute - one of                                                           paired             +---------+---------------+---------+-----------+----------+------------------+     Summary: BILATERAL: -No evidence of popliteal cyst, bilaterally. -Subcutaneous edema, bilaterally. RIGHT: - There is no evidence of deep vein thrombosis in the lower extremity.  LEFT: - Findings consistent with acute deep vein thrombosis involving the left gastrocnemius veins, and left posterior tibial veins.  *See table(s) above for  measurements and observations. Electronically signed  by Waverly Ferrari MD on 01/18/2023 at 7:14:07 PM.    Final    MR Lumbar Spine W Wo Contrast  Result Date: 01/14/2023 CLINICAL DATA:  Back pain extending into both lower extremities. Sepsis. Previous studies demonstrating findings suspicious for L4 and L5 osteonecrosis with possible superimposed infection. Clinical concern for infection. EXAM: MRI LUMBAR SPINE WITHOUT AND WITH CONTRAST TECHNIQUE: Multiplanar and multiecho pulse sequences of the lumbar spine were obtained without and with intravenous contrast. CONTRAST:  10mL GADAVIST GADOBUTROL 1 MMOL/ML IV SOLN COMPARISON:  Lumbar MRI 01/03/2023 and 12/25/2022. FINDINGS: Segmentation: Conventional anatomy assumed, with the last open disc space designated L5-S1.Concordant with prior imaging. Alignment:  Physiologic. Vertebrae: Progressive heterogeneous marrow signal abnormalities are identified throughout the L4 and L5 vertebral bodies. There is heterogeneous low T1 and high T2 signal. Relatively limited enhancement of the marrow in these vertebral bodies is seen following contrast, again consistent with osteonecrosis. There are some serpiginous areas of peripheral enhancement within both vertebral bodies. As previously noted, there is T2 hyperintensity within the L4-5 disc without suspicious enhancement or endplate destruction. New mild endplate edema and enhancement in the inferior endplate of L1. Persistent changes of probable septic arthritis at the right L4-5 facet joint. The visualized sacroiliac joints appear unremarkable. Conus medullaris: Extends to the L1 level and appears normal. Progressive abnormality of the ventral epidural space now extending from T12 through the upper sacrum with peripheral enhancement, suspicious for an epidural abscess. Paraspinal and other soft tissues: There are progressive paraspinal inflammatory changes with peripherally enhancing fluid collections in both psoas  muscles, measuring up to 2.5 cm on the left (image 34/9). As above, periarticular enhancement associated with the right facet joint at L4-5 consistent with septic arthritis. The urinary bladder is markedly distended. Disc levels: Sagittal images demonstrate no significant disc space findings within the visualized lower thoracic spine. L1-2: Stable loss of disc height with disc bulging and endplate osteophytes. Mild facet hypertrophy. Progressive superior extension of the ventral epidural fluid collection to this level. No significant spinal stenosis or nerve root encroachment. L2-3: Stable loss of disc height with annular disc bulging and endplate osteophytes. Progressive ventral epidural fluid collection at this level with mild mass effect on the thecal sac. Similar moderate spinal stenosis with lateral recess narrowing bilaterally. L3-4: Stable chronic loss of disc height with annular disc bulging and endplate osteophytes. The anterior epidural collection at this level has also mildly enlarged. Resulting moderate spinal stenosis with asymmetric narrowing of the right lateral recess. L4-5: As above, persistent discal T2 hyperintensity without endplate destruction or abnormal enhancement in the disc. Enlarging ventral epidural fluid collection with increasing mass effect on the thecal sac and increasing paraspinal inflammatory changes and ill-defined fluid collections as described above. Moderate to severe spinal stenosis with lateral recess and foraminal narrowing bilaterally. As above, persistent evidence of septic arthritis at the right facet joint. L5-S1: Stable loss of disc height with annular disc bulging and endplate osteophytes. Enlarging ventral epidural fluid collection extending posterior to the upper sacrum with increased mass effect on the sacral spinal canal. Mild foraminal narrowing bilaterally. IMPRESSION: 1. Compared with the previous MRI from 01/03/2023, there is progressive paraspinal inflammatory  changes with peripherally enhancing fluid collections in both psoas muscles consistent with paraspinal abscesses. 2. There are progressive heterogeneous marrow signal changes within the L4 and L5 vertebral bodies consistent with osteonecrosis. As previously noted, there is L4-5 discal T2 hyperintensity without endplate destruction or abnormal enhancement as typically seen with discitis. However, the osseous  changes and surrounding soft tissue findings are concerning for superimposed infection. 3. Enlarging ventral epidural fluid collection extending from T12 through the upper sacrum with peripheral enhancement, suspicious for an epidural abscess. There is increasing mass effect on the thecal sac, greatest at L4-5 where there is moderate to severe spinal stenosis. 4. Persistent evidence of septic arthritis at the right L4-5 facet joint. 5. New mild endplate edema and enhancement in the inferior endplate of L1 without evidence of adjacent disc abnormality, likely reactive. 6. Distended urinary bladder. Electronically Signed   By: Carey Bullocks M.D.   On: 01/14/2023 15:40   MR WRIST RIGHT W WO CONTRAST  Result Date: 01/05/2023 CLINICAL DATA:  Soft tissue infection EXAM: MR OF THE RIGHT WRIST WITHOUT AND WITH CONTRAST TECHNIQUE: Multiplanar multisequence MR imaging of the right wrist was performed both before and after the administration of intravenous contrast. The exam was centered up in the central hand region more than the wrist region, as this is the site of suspected infection based on prior imaging. CONTRAST:  10mL GADAVIST GADOBUTROL 1 MMOL/ML IV SOLN COMPARISON:  12/27/2022 FINDINGS: Despite efforts by the technologist and patient, severe motion artifact is present on today's exam and could not be eliminated. This reduces exam sensitivity and specificity. Ligaments: Torn scapholunate ligament. Triangular fibrocartilage: Not characterized in an ideal fashion, no obvious defect. Tendons: Tenosynovitis is  again observed in the second extensor compartment. Carpal tunnel/median nerve: Low-grade edema tracks in the carpal tunnel without expansion of the median nerve identified. Guyon's canal: No obvious or large impinging lesions although motion artifact contributes to suboptimal assessment. Joint/cartilage: Moderate effusion the radiocarpal joint and along the midcarpal row dorsally. Synovial enhancement in this region noted. Bones/carpal alignment: Adjacent abnormal edema signal in the scaphoid and lunate, probably degenerative. Small geodes along the triquetrum, hamate, and trapezoid. Further distally in the hand, there is extensive edema, enhancement, and heterogeneity in the head and distal metaphysis of the index finger metacarpal, and in the proximal phalanx of the index finger, compatible with osteomyelitis and raising suspicion for a septic index finger MCP joint. Imaging does not include the middle or distal phalanges. Small foci subcortical edema signal along the middle finger and ring finger MCP joints, likely degenerative. Other: Connecting to the second digit MCP joint, a volar abscess measuring about 3.4 by 1.0 by 3.2 cm (volume = 5.7 cm^3) is observed as shown for example on image 9 of series 17. This is deep to the flexor tendons and primarily along the abductor pollicis and adjacent interosseous muscles. Upper there is also a smaller dorsal abscess extending proximally from the region of the joint but deep to the extensor tendons, measuring about 2.6 by 0.7 by 2.3 cm (volume = 2 cm^3). There is substantial edema and enhancement tracking in the dorsal subcutaneous tissues the hand likely reflecting cellulitis. There is also edema and low-grade enhancement tracking on fascia planes throughout the visualized portion of the hand suspicious for local inflammation and possibly fasciitis. No obvious gas in the soft tissues noted. IMPRESSION: 1. 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 There are 2 adjacent abscesses extending proximally from the joint, the larger palmar to the joint and extending along the abductor pollicis muscle, and the smaller abscess dorsal to the index finger metacarpal. 2. Dorsal cellulitis in the hand. 3. Edema and low-grade enhancement tracking on fascia planes throughout the visualized portion of the hand suspicious for local inflammation and possibly fasciitis.  4. Torn scapholunate ligament. 5. Moderate effusion of the radiocarpal joint and along the midcarpal row dorsally. 6. Degenerative findings in the carpus and along the middle and ring finger MCP joints. 7. Despite efforts by the technologist and patient, severe motion artifact is present on today's exam and could not be eliminated. This adversely affects exam sensitivity and specificity. Electronically Signed   By: Gaylyn Rong M.D.   On: 01/05/2023 10:27   MR Lumbar Spine W Wo Contrast  Result Date: 01/03/2023 CLINICAL DATA:  Follow-up examination for infection. History of MSSA bacteremia EXAM: MRI LUMBAR SPINE WITHOUT AND WITH CONTRAST TECHNIQUE: Multiplanar and multiecho pulse sequences of the lumbar spine were obtained without and with intravenous contrast. CONTRAST:  9.64mL GADAVIST GADOBUTROL 1 MMOL/ML IV SOLN COMPARISON:  Comparison made with recent MRI from 12/25/2022. FINDINGS: Segmentation: Standard. Lowest well-formed disc space labeled the L5-S1 level. Alignment: Physiologic with preservation of the normal lumbar lordosis. No interval listhesis. Vertebrae: Heterogeneous precontrast T1 and T2 signal intensity, with mildly increased STIR signal intensity again seen within the L4 and L5 vertebral bodies. Marked lack of associated enhancement following contrast administration, similar to prior (series 10, image 8). Partial extension to involve the left pedicle at L5. Finding again consistent with osteonecrosis with infarction. Vertebral body height is  maintained without interval collapse. Abnormal fluid signal intensity seen within the L4-5 interspace. Progressive surrounding paraspinous inflammatory changes within the bilateral psoas musculature, left greater than right (series 8, image 28). Few small loculated collections are no evident within both psoas muscles largest of which measures 1.4 cm on the right (series 11, image 28). Given this, there is presumed superimposed infection, likely related to discitis at the L4-5 interspace. Additionally, there is progressive inflammatory changes about the right L4-5 facet, consistent with acute septic arthritis. Few loculated collections within the adjacent right posterior paraspinous musculature measure up to 1.3 cm (series 11, image 26). Again seen is abnormal appearance of the ventral epidural space, extending from L2-3 through L5-S1 (series 5, image 9). Given the concomitant inflammatory changes about the paraspinous soft tissues, findings raise the concern for and epidural abscess. This extends from L2-3 through L5-S1, and measures approximately 0.9 x 2.0 x 8.4 cm in maximal dimensions (AP by transverse by craniocaudad). Additionally, there is progressive enhancement about the dorsal epidural space extending from L2-3 through L5-S1 without well-formed abscess or collection. No other evidence for new or distant infection elsewhere within the lumbar spine. Conus medullaris and cauda equina: Conus extends to the L1 level. Conus within normal limits. Nerve roots of the cauda equina within normal limits. Paraspinal and other soft tissues: Paraspinous inflammatory changes about the lower lumbar spine as above. 1.2 cm simple cyst present at the lower pole the left kidney, benign in appearance, no follow-up imaging recommended. Disc levels: L1-2: Disc desiccation with diffuse disc bulge. Mild to moderate left greater than right facet hypertrophy. No spinal stenosis. Foramina remain patent. L2-3: Diffuse disc bulge with  endplate spurring. Moderate bilateral facet hypertrophy. Small epidural collection within the ventral epidural space. Resultant moderate spinal stenosis. Mild to moderate bilateral L2 foraminal narrowing. L3-4: Disc desiccation with diffuse disc bulge. Mild facet hypertrophy. Ventral epidural collection. Mild to moderate spinal stenosis. Moderate right with mild-to-moderate left L3 foraminal narrowing. L4-5: Intervertebral disc space narrowing. Changes of osteonecrosis/infarct about the L4 and L5 vertebral bodies with superimposed findings concerning for discitis as above. Underlying disc bulge with endplate spurring. Mild facet hypertrophy. Ventral epidural collection. Associated moderate to severe canal with  bilateral subarticular stenosis, similar to prior. Moderate right worse than left L4 foraminal narrowing. L5-S1: Diffuse disc bulge with disc desiccation. Associated reactive endplate spurring. Disc bulging asymmetric to the left with associated left-sided annular fissure. No significant spinal stenosis. Mild bilateral L5 foraminal narrowing. IMPRESSION: 1. 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 2. Progressive superimposed changes concerning for infection with probable discitis at L4-5, with evidence for septic arthritis at the right L4-5 facet. Progressive associated paraspinous inflammatory changes, including multiple small loculated collections within the bilateral psoas muscles and right posterior paraspinous soft tissues as above. 3. Continued abnormal appearance of the ventral epidural space extending from L2-3 through L5-S1, mildly worsened from prior. Again, while this may in part be related to basivertebral venous thrombosis related to osteonecrosis, possible epidural abscess is difficult to exclude given the progressive inflammatory changes elsewhere within this region. 4. Underlying multilevel lumbar spondylosis with up to severe spinal  stenosis at L4-5. Electronically Signed   By: Rise Mu M.D.   On: 01/03/2023 22:29   ECHO TEE  Result Date: 12/31/2022    TRANSESOPHOGEAL ECHO REPORT   Patient Name:   KAMIN WAYE Date of Exam: 12/31/2022 Medical Rec #:  [id number]        Height:       72.0 in Accession #:    [id number]       Weight:       208.0 lb Date of Birth:  [birth year]        BSA:          2.166 m Patient Age:    72 years         BP:           114/72 mmHg Patient Gender: M                HR:           78 bpm. Exam Location:  Inpatient Procedure: Transesophageal Echo, Color Doppler and Cardiac Doppler Indications:    Bacteremia  History:        Patient has prior history of Echocardiogram examinations, most                 recent 12/25/2022. Signs/Symptoms:Bacteremia; Risk                 Factors:Hypertension, Diabetes, Dyslipidemia and Sleep Apnea.  Sonographer:    Milbert Coulter Referring Phys: [id number] Jonita Albee PROCEDURE: After discussion of the risks and benefits of a TEE, an informed consent was obtained from the patient. The transesophogeal probe was passed without difficulty through the esophogus of the patient. Imaged were obtained with the patient in a left lateral decubitus position. Sedation performed by different physician. The patient was monitored while under deep sedation. Anesthestetic sedation was provided intravenously by Anesthesiology: 182.5mg  of Propofol, 100mg  of Lidocaine. Image quality was good. The patient's vital signs; including heart rate, blood pressure, and oxygen saturation; remained stable throughout the procedure. The patient developed no complications during the procedure.  IMPRESSIONS  1. Left ventricular ejection fraction, by estimation, is 60 to 65%. The left ventricle has normal function.  2. Right ventricular systolic function is normal. The right ventricular size is normal.  3. No left atrial/left atrial appendage thrombus was detected.  4. The mitral valve is normal in structure.  Trivial mitral valve regurgitation.  5. The aortic valve is tricuspid. Aortic valve regurgitation is not visualized. No aortic stenosis is present. Conclusion(s)/Recommendation(s): No evidence  of vegetation/infective endocarditis on this transesophageael echocardiogram. FINDINGS  Left Ventricle: Left ventricular ejection fraction, by estimation, is 60 to 65%. The left ventricle has normal function. The left ventricular internal cavity size was normal in size. Right Ventricle: The right ventricular size is normal. No increase in right ventricular wall thickness. Right ventricular systolic function is normal. Left Atrium: Left atrial size was normal in size. No left atrial/left atrial appendage thrombus was detected. Right Atrium: Right atrial size was normal in size. Pericardium: There is no evidence of pericardial effusion. Mitral Valve: The mitral valve is normal in structure. Trivial mitral valve regurgitation. Tricuspid Valve: The tricuspid valve is normal in structure. Tricuspid valve regurgitation is trivial. Aortic Valve: The aortic valve is tricuspid. Aortic valve regurgitation is not visualized. No aortic stenosis is present. Pulmonic Valve: The pulmonic valve was grossly normal. Pulmonic valve regurgitation is trivial. Aorta: The aortic root and ascending aorta are structurally normal, with no evidence of dilitation. IAS/Shunts: No atrial level shunt detected by color flow Doppler. Additional Comments: Spectral Doppler performed. Epifanio Lesches MD Electronically signed by Epifanio Lesches MD Signature Date/Time: 12/31/2022/11:08:06 AM    Final    EP STUDY  Result Date: 12/31/2022 See surgical note for result.  VAS [place] UPPER EXTREMITY VENOUS DUPLEX  Result Date: 12/30/2022 UPPER VENOUS STUDY  Patient Name:  THIBAULT ULLOM  Date of Exam:   12/30/2022 Medical Rec #: [id number]         Accession #:    [id number] Date of Birth: 1951-01-22         Patient Gender: M Patient Age:   56 years Exam  Location:  [facility] Procedure:      VAS [place] UPPER EXTREMITY VENOUS DUPLEX Referring Phys: PRANAV PATEL --------------------------------------------------------------------------------  Indications: Pain, and Swelling Anticoagulation: Lovenox. Comparison Study: Previous RUE 12/28/22 superficial in basilic.                   No previous LUE. Performing Technologist: McKayla Maag RVT, [place]  Examination Guidelines: A complete evaluation includes B-mode imaging, spectral Doppler, color Doppler, and power Doppler as needed of all accessible portions of each vessel. Bilateral testing is considered an integral part of a complete examination. Limited examinations for reoccurring indications may be performed as noted.  Right Findings: +----------+------------+---------+-----------+----------+-------+ RIGHT     CompressiblePhasicitySpontaneousPropertiesSummary +----------+------------+---------+-----------+----------+-------+ IJV           Full       Yes       Yes                      +----------+------------+---------+-----------+----------+-------+ Subclavian    Full       Yes       Yes                      +----------+------------+---------+-----------+----------+-------+ Axillary      Full       Yes       Yes                      +----------+------------+---------+-----------+----------+-------+ Brachial      Full       Yes       Yes                      +----------+------------+---------+-----------+----------+-------+ Radial        Full                                          +----------+------------+---------+-----------+----------+-------+  Ulnar         Full                                          +----------+------------+---------+-----------+----------+-------+ Cephalic      Full                                          +----------+------------+---------+-----------+----------+-------+ Basilic     Partial      Yes       Yes               Acute   +----------+------------+---------+-----------+----------+-------+ Superficial thrombus in the right basilic vein of the proximal upper arm is noted.  Left Findings: +----------+------------+---------+-----------+----------+-------+ LEFT      CompressiblePhasicitySpontaneousPropertiesSummary +----------+------------+---------+-----------+----------+-------+ IJV           Full       Yes       Yes                      +----------+------------+---------+-----------+----------+-------+ Subclavian    Full       Yes       Yes                      +----------+------------+---------+-----------+----------+-------+ Axillary      Full       Yes       Yes                      +----------+------------+---------+-----------+----------+-------+ Brachial      Full       Yes       Yes                      +----------+------------+---------+-----------+----------+-------+ Radial        Full                                          +----------+------------+---------+-----------+----------+-------+ Ulnar         Full                                          +----------+------------+---------+-----------+----------+-------+ Cephalic    Partial      No        No                Acute  +----------+------------+---------+-----------+----------+-------+ Basilic     Partial      No        No                Acute  +----------+------------+---------+-----------+----------+-------+ Superficial thrombus in the left basilic vein of the distal upper arm to wrist is noted. As well as superficial thrombus in the left cephalic vein of the mid to distal forearm.  Summary:  Right: No evidence of deep vein thrombosis in the upper extremity. Findings consistent with acute superficial vein thrombosis involving the right basilic vein.  Left: No evidence of deep vein thrombosis in the upper extremity. Findings consistent with acute superficial vein thrombosis involving the left basilic vein and  left cephalic vein.  *See table(s) above for measurements and observations.  Diagnosing physician: Lemar Livings MD Electronically signed by Lemar Livings MD on 12/30/2022 at 4:41:20 PM.    Final       Subjective: Patient seen and examined at bedside today.  Hemodynamically stable comfortable.  Back pain well-controlled.  Medically stable for discharge  Discharge Exam: Vitals:   01/27/23 2003 01/27/23 2343  BP: 129/89 129/73  Pulse:  90  Resp: 20 19  Temp: 98.5 F (36.9 C) 98.5 F (36.9 C)  SpO2: 98% 91%   Vitals:   01/27/23 1150 01/27/23 1534 01/27/23 2003 01/27/23 2343  BP:  (!) 142/73 129/89 129/73  Pulse:  96  90  Resp:   20 19  Temp:  97.7 F (36.5 C) 98.5 F (36.9 C) 98.5 F (36.9 C)  TempSrc:  Oral Oral Oral  SpO2: 97% 98% 98% 91%  Weight:      Height:        General: Pt is alert, awake, not in acute distress, weak, deconditioned Cardiovascular: RRR, S1/S2 +, no rubs, no gallops Respiratory: CTA bilaterally, no wheezing, no rhonchi Abdominal: Soft, NT, ND, bowel sounds + Extremities: Bilateral lower extremity edema, no cyanosis    The results of significant diagnostics from this hospitalization (including imaging, microbiology, ancillary and laboratory) are listed below for reference.     Microbiology: Recent Results (from the past 240 hour(s))  Surgical pcr screen     Status: None   Collection Time: 01/20/23  7:19 AM   Specimen: Nasal Mucosa; Nasal Swab  Result Value Ref Range Status   MRSA, PCR NEGATIVE NEGATIVE Final   Staphylococcus aureus NEGATIVE NEGATIVE Final    Comment: (NOTE) The Xpert SA Assay (FDA approved for NASAL specimens in patients 49 years of age and older), is one component of a comprehensive surveillance program. It is not intended to diagnose infection nor to guide or monitor treatment. Performed at [facility], [address], [place], [place] [address]   Aerobic/Anaerobic Culture w Gram Stain (surgical/deep wound)      Status: None   Collection Time: 01/20/23  7:01 PM   Specimen: Wound; Abscess  Result Value Ref Range Status   Specimen Description WOUND  Final   Special Requests LUMBAR EPIDURAL SPACE  Final   Gram Stain   Final    FEW WBC PRESENT,BOTH PMN AND MONONUCLEAR NO ORGANISMS SEEN    Culture   Final    No growth aerobically or anaerobically. Performed at [facility], [address], [place], [place] [address]    Report Status 01/25/2023 FINAL  Final  Aerobic/Anaerobic Culture w Gram Stain (surgical/deep wound)     Status: None   Collection Time: 01/20/23  7:27 PM   Specimen: Abscess  Result Value Ref Range Status   Specimen Description ABSCESS  Final   Special Requests EPIDURAL ABSCESS  Final   Gram Stain   Final    RARE WBC PRESENT,BOTH PMN AND MONONUCLEAR NO ORGANISMS SEEN    Culture   Final    No growth aerobically or anaerobically. Performed at [facility], [address], [place], [place] [address]    Report Status 01/25/2023 FINAL  Final     Labs: BNP (last 3 results) Recent Labs    01/06/23 0424 01/23/23 0515  BNP 51.0 27.0   Basic Metabolic Panel: Recent Labs  Lab 01/22/23 0520 01/25/23 0445 01/26/23 0340 01/27/23 0401 01/28/23 0530  NA 136 133* 134*  135 137  K 3.6 2.9* 3.3* 3.3* 3.3*  CL 102 99 99 100 102  CO2 28 27 28 27 28   GLUCOSE 190* 190* 155* 219* 135*  BUN 9 10 9 9  5*  CREATININE 0.70 0.71 0.82 0.81 0.78  CALCIUM 8.1* 7.9* 8.3* 8.2* 8.4*  MG  --  1.6* 2.0  --   --    Liver Function Tests: No results for input(s): "AST", "ALT", "ALKPHOS", "BILITOT", "PROT", "ALBUMIN" in the last 168 hours. No results for input(s): "LIPASE", "AMYLASE" in the last 168 hours. No results for input(s): "AMMONIA" in the last 168 hours. CBC: Recent Labs  Lab 01/23/23 0515 01/24/23 0500 01/25/23 0445 01/26/23 0340 01/28/23 0530  WBC 8.7 7.9 7.6 7.4 6.9  HGB 8.1* 7.6* 7.5* 7.8* 7.8*  HCT 26.3* 25.2* 24.8* 25.8* 26.0*  MCV 88.9 89.4 89.9 88.4 88.4   PLT 160 151 184 191 189   Cardiac Enzymes: No results for input(s): "CKTOTAL", "CKMB", "CKMBINDEX", "TROPONINI" in the last 168 hours. BNP: Invalid input(s): "POCBNP" CBG: Recent Labs  Lab 01/26/23 2117 01/27/23 0621 01/27/23 1050 01/27/23 2113 01/28/23 0603  GLUCAP 242* 191* 203* 209* 129*   D-Dimer No results for input(s): "DDIMER" in the last 72 hours. Hgb A1c No results for input(s): "HGBA1C" in the last 72 hours. Lipid Profile No results for input(s): "CHOL", "HDL", "LDLCALC", "TRIG", "CHOLHDL", "LDLDIRECT" in the last 72 hours. Thyroid function studies No results for input(s): "TSH", "T4TOTAL", "T3FREE", "THYROIDAB" in the last 72 hours.  Invalid input(s): "FREET3" Anemia work up No results for input(s): "VITAMINB12", "FOLATE", "FERRITIN", "TIBC", "IRON", "RETICCTPCT" in the last 72 hours. Urinalysis    Component Value Date/Time   COLORURINE YELLOW 01/11/2023 0941   APPEARANCEUR HAZY (A) 01/11/2023 0941   LABSPEC 1.016 01/11/2023 0941   PHURINE 5.0 01/11/2023 0941   GLUCOSEU 150 (A) 01/11/2023 0941   HGBUR MODERATE (A) 01/11/2023 0941   BILIRUBINUR NEGATIVE 01/11/2023 0941   KETONESUR NEGATIVE 01/11/2023 0941   PROTEINUR NEGATIVE 01/11/2023 0941   UROBILINOGEN 2.0 (H) 05/09/2010 0945   NITRITE NEGATIVE 01/11/2023 0941   LEUKOCYTESUR NEGATIVE 01/11/2023 0941   Sepsis Labs Recent Labs  Lab 01/24/23 0500 01/25/23 0445 01/26/23 0340 01/28/23 0530  WBC 7.9 7.6 7.4 6.9   Microbiology Recent Results (from the past 240 hour(s))  Surgical pcr screen     Status: None   Collection Time: 01/20/23  7:19 AM   Specimen: Nasal Mucosa; Nasal Swab  Result Value Ref Range Status   MRSA, PCR NEGATIVE NEGATIVE Final   Staphylococcus aureus NEGATIVE NEGATIVE Final    Comment: (NOTE) The Xpert SA Assay (FDA approved for NASAL specimens in patients 78 years of age and older), is one component of a comprehensive surveillance program. It is not intended to diagnose  infection nor to guide or monitor treatment. Performed at [facility], [address] [place], [place] [address]   Aerobic/Anaerobic Culture w Gram Stain (surgical/deep wound)     Status: None   Collection Time: 01/20/23  7:01 PM   Specimen: Wound; Abscess  Result Value Ref Range Status   Specimen Description WOUND  Final   Special Requests LUMBAR EPIDURAL SPACE  Final   Gram Stain   Final    FEW WBC PRESENT,BOTH PMN AND MONONUCLEAR NO ORGANISMS SEEN    Culture   Final    No growth aerobically or anaerobically. Performed at [facility], [address], [place], [place] [address]    Report Status 01/25/2023 FINAL  Final  Aerobic/Anaerobic Culture w Gram Stain (surgical/deep wound)     Status: None   Collection Time: 01/20/23  7:27 PM   Specimen: Abscess  Result Value Ref Range Status   Specimen Description ABSCESS  Final   Special Requests EPIDURAL ABSCESS  Final   Gram Stain   Final    RARE WBC PRESENT,BOTH PMN AND MONONUCLEAR NO ORGANISMS SEEN    Culture   Final    No growth aerobically or anaerobically. Performed at [facility], [address], [place], [place] [address]    Report Status 01/25/2023 FINAL  Final    Please note: You were cared for by a hospitalist during your hospital stay. Once you are discharged, your primary care physician will handle any further medical issues. Please note that NO REFILLS for any discharge medications will be authorized once you are discharged, as it is imperative that you return to your primary care physician (or establish a relationship with a primary care physician if you do not have one) for your post hospital discharge needs so that they can reassess your need for medications and monitor your lab values.    Time coordinating discharge: 40 minutes  SIGNED:   Burnadette Pop, MD  [facility] 01/28/2023, 7:32 AM Pager [phone]  If 7PM-7AM, please contact night-coverage [email or web address] Password  [email or web address]

## 2023-01-27 NOTE — Progress Notes (Signed)
Discharge to SNF cancelled due to no male bed available for him. MD aware.

## 2023-01-27 NOTE — Inpatient Diabetes Management (Signed)
Inpatient Diabetes Program Recommendations  AACE/ADA: New Consensus Statement on Inpatient Glycemic Control  Target Ranges:  Prepandial:   less than 140 mg/dL      Peak postprandial:   less than 180 mg/dL (1-2 hours)      Critically ill patients:  140 - 180 mg/dL    Latest Reference Range & Units 01/26/23 06:27 01/26/23 11:27 01/26/23 15:42 01/26/23 21:17 01/27/23 06:21  Glucose-Capillary 70 - 99 mg/dL 161 (H) 096 (H) 045 (H) 242 (H) 191 (H)   Review of Glycemic Control  Diabetes history: DM2 Outpatient Diabetes medications: Jardiance 25 mg  daily, 70/30 50 units in AM, 20 units in PM Current orders for Inpatient glycemic control: Semglee 15 units at bedtime, Novolog 0-15 units TID with meals, Novolog 0-5 units QHS  Inpatient Diabetes Program Recommendations:    Insulin: Post prandial glucose consistently elevated. Please consider ordering Novolog 3 units TID with meals for meal coverage if patient eats at least 50% of meals.  Thanks, Orlando Penner, RN, MSN, CDCES Diabetes Coordinator Inpatient Diabetes Program [phone] (Team Pager from 8am to 5pm)

## 2023-01-28 DIAGNOSIS — M4712 Other spondylosis with myelopathy, cervical region: Secondary | ICD-10-CM | POA: Diagnosis not present

## 2023-01-28 DIAGNOSIS — R7881 Bacteremia: Secondary | ICD-10-CM | POA: Diagnosis not present

## 2023-01-28 DIAGNOSIS — I4891 Unspecified atrial fibrillation: Secondary | ICD-10-CM | POA: Diagnosis not present

## 2023-01-28 DIAGNOSIS — F319 Bipolar disorder, unspecified: Secondary | ICD-10-CM | POA: Diagnosis not present

## 2023-01-28 DIAGNOSIS — M71031 Abscess of bursa, right wrist: Secondary | ICD-10-CM | POA: Diagnosis not present

## 2023-01-28 DIAGNOSIS — R443 Hallucinations, unspecified: Secondary | ICD-10-CM | POA: Diagnosis present

## 2023-01-28 DIAGNOSIS — Z743 Need for continuous supervision: Secondary | ICD-10-CM | POA: Diagnosis not present

## 2023-01-28 DIAGNOSIS — I482 Chronic atrial fibrillation, unspecified: Secondary | ICD-10-CM | POA: Diagnosis not present

## 2023-01-28 DIAGNOSIS — E871 Hypo-osmolality and hyponatremia: Secondary | ICD-10-CM | POA: Diagnosis present

## 2023-01-28 DIAGNOSIS — B965 Pseudomonas (aeruginosa) (mallei) (pseudomallei) as the cause of diseases classified elsewhere: Secondary | ICD-10-CM | POA: Diagnosis present

## 2023-01-28 DIAGNOSIS — R2681 Unsteadiness on feet: Secondary | ICD-10-CM | POA: Diagnosis not present

## 2023-01-28 DIAGNOSIS — Z87891 Personal history of nicotine dependence: Secondary | ICD-10-CM | POA: Diagnosis not present

## 2023-01-28 DIAGNOSIS — R531 Weakness: Secondary | ICD-10-CM | POA: Diagnosis not present

## 2023-01-28 DIAGNOSIS — R2689 Other abnormalities of gait and mobility: Secondary | ICD-10-CM | POA: Diagnosis not present

## 2023-01-28 DIAGNOSIS — R609 Edema, unspecified: Secondary | ICD-10-CM | POA: Diagnosis not present

## 2023-01-28 DIAGNOSIS — E440 Moderate protein-calorie malnutrition: Secondary | ICD-10-CM | POA: Diagnosis not present

## 2023-01-28 DIAGNOSIS — E870 Hyperosmolality and hypernatremia: Secondary | ICD-10-CM | POA: Diagnosis present

## 2023-01-28 DIAGNOSIS — E11649 Type 2 diabetes mellitus with hypoglycemia without coma: Secondary | ICD-10-CM | POA: Diagnosis not present

## 2023-01-28 DIAGNOSIS — E7800 Pure hypercholesterolemia, unspecified: Secondary | ICD-10-CM | POA: Diagnosis not present

## 2023-01-28 DIAGNOSIS — R52 Pain, unspecified: Secondary | ICD-10-CM | POA: Diagnosis not present

## 2023-01-28 DIAGNOSIS — B9561 Methicillin susceptible Staphylococcus aureus infection as the cause of diseases classified elsewhere: Secondary | ICD-10-CM | POA: Diagnosis not present

## 2023-01-28 DIAGNOSIS — F39 Unspecified mood [affective] disorder: Secondary | ICD-10-CM | POA: Diagnosis not present

## 2023-01-28 DIAGNOSIS — N390 Urinary tract infection, site not specified: Secondary | ICD-10-CM | POA: Diagnosis present

## 2023-01-28 DIAGNOSIS — E1165 Type 2 diabetes mellitus with hyperglycemia: Secondary | ICD-10-CM | POA: Diagnosis not present

## 2023-01-28 DIAGNOSIS — I10 Essential (primary) hypertension: Secondary | ICD-10-CM | POA: Diagnosis not present

## 2023-01-28 DIAGNOSIS — G9341 Metabolic encephalopathy: Secondary | ICD-10-CM | POA: Diagnosis present

## 2023-01-28 DIAGNOSIS — K219 Gastro-esophageal reflux disease without esophagitis: Secondary | ICD-10-CM | POA: Diagnosis present

## 2023-01-28 DIAGNOSIS — E441 Mild protein-calorie malnutrition: Secondary | ICD-10-CM | POA: Diagnosis not present

## 2023-01-28 DIAGNOSIS — R5381 Other malaise: Secondary | ICD-10-CM | POA: Diagnosis not present

## 2023-01-28 DIAGNOSIS — G062 Extradural and subdural abscess, unspecified: Secondary | ICD-10-CM | POA: Diagnosis not present

## 2023-01-28 DIAGNOSIS — D509 Iron deficiency anemia, unspecified: Secondary | ICD-10-CM | POA: Diagnosis not present

## 2023-01-28 DIAGNOSIS — F329 Major depressive disorder, single episode, unspecified: Secondary | ICD-10-CM | POA: Diagnosis not present

## 2023-01-28 DIAGNOSIS — N179 Acute kidney failure, unspecified: Secondary | ICD-10-CM | POA: Diagnosis not present

## 2023-01-28 DIAGNOSIS — Z95828 Presence of other vascular implants and grafts: Secondary | ICD-10-CM | POA: Diagnosis not present

## 2023-01-28 DIAGNOSIS — G8929 Other chronic pain: Secondary | ICD-10-CM | POA: Diagnosis present

## 2023-01-28 DIAGNOSIS — Z7401 Bed confinement status: Secondary | ICD-10-CM | POA: Diagnosis not present

## 2023-01-28 DIAGNOSIS — M659 Synovitis and tenosynovitis, unspecified: Secondary | ICD-10-CM | POA: Diagnosis not present

## 2023-01-28 DIAGNOSIS — L89890 Pressure ulcer of other site, unstageable: Secondary | ICD-10-CM | POA: Diagnosis not present

## 2023-01-28 DIAGNOSIS — E785 Hyperlipidemia, unspecified: Secondary | ICD-10-CM | POA: Diagnosis not present

## 2023-01-28 DIAGNOSIS — Z66 Do not resuscitate: Secondary | ICD-10-CM | POA: Diagnosis present

## 2023-01-28 DIAGNOSIS — Z794 Long term (current) use of insulin: Secondary | ICD-10-CM | POA: Diagnosis not present

## 2023-01-28 DIAGNOSIS — R600 Localized edema: Secondary | ICD-10-CM | POA: Diagnosis not present

## 2023-01-28 DIAGNOSIS — M4722 Other spondylosis with radiculopathy, cervical region: Secondary | ICD-10-CM | POA: Diagnosis not present

## 2023-01-28 DIAGNOSIS — F32A Depression, unspecified: Secondary | ICD-10-CM | POA: Diagnosis not present

## 2023-01-28 DIAGNOSIS — Z8249 Family history of ischemic heart disease and other diseases of the circulatory system: Secondary | ICD-10-CM | POA: Diagnosis not present

## 2023-01-28 DIAGNOSIS — R9389 Abnormal findings on diagnostic imaging of other specified body structures: Secondary | ICD-10-CM | POA: Diagnosis not present

## 2023-01-28 DIAGNOSIS — N400 Enlarged prostate without lower urinary tract symptoms: Secondary | ICD-10-CM | POA: Diagnosis not present

## 2023-01-28 DIAGNOSIS — Z1152 Encounter for screening for COVID-19: Secondary | ICD-10-CM | POA: Diagnosis not present

## 2023-01-28 DIAGNOSIS — E119 Type 2 diabetes mellitus without complications: Secondary | ICD-10-CM | POA: Diagnosis not present

## 2023-01-28 DIAGNOSIS — R0609 Other forms of dyspnea: Secondary | ICD-10-CM | POA: Diagnosis not present

## 2023-01-28 DIAGNOSIS — I82402 Acute embolism and thrombosis of unspecified deep veins of left lower extremity: Secondary | ICD-10-CM | POA: Diagnosis not present

## 2023-01-28 DIAGNOSIS — I480 Paroxysmal atrial fibrillation: Secondary | ICD-10-CM | POA: Diagnosis not present

## 2023-01-28 DIAGNOSIS — M5126 Other intervertebral disc displacement, lumbar region: Secondary | ICD-10-CM | POA: Diagnosis not present

## 2023-01-28 DIAGNOSIS — E876 Hypokalemia: Secondary | ICD-10-CM | POA: Diagnosis not present

## 2023-01-28 DIAGNOSIS — R799 Abnormal finding of blood chemistry, unspecified: Secondary | ICD-10-CM | POA: Diagnosis not present

## 2023-01-28 DIAGNOSIS — R296 Repeated falls: Secondary | ICD-10-CM | POA: Diagnosis not present

## 2023-01-28 DIAGNOSIS — M6281 Muscle weakness (generalized): Secondary | ICD-10-CM | POA: Diagnosis not present

## 2023-01-28 DIAGNOSIS — L89626 Pressure-induced deep tissue damage of left heel: Secondary | ICD-10-CM | POA: Diagnosis present

## 2023-01-28 DIAGNOSIS — R41841 Cognitive communication deficit: Secondary | ICD-10-CM | POA: Diagnosis not present

## 2023-01-28 DIAGNOSIS — B379 Candidiasis, unspecified: Secondary | ICD-10-CM | POA: Diagnosis not present

## 2023-01-28 DIAGNOSIS — M879 Osteonecrosis, unspecified: Secondary | ICD-10-CM | POA: Diagnosis not present

## 2023-01-28 DIAGNOSIS — R4182 Altered mental status, unspecified: Secondary | ICD-10-CM | POA: Diagnosis not present

## 2023-01-28 DIAGNOSIS — L89102 Pressure ulcer of unspecified part of back, stage 2: Secondary | ICD-10-CM | POA: Diagnosis not present

## 2023-01-28 DIAGNOSIS — M4646 Discitis, unspecified, lumbar region: Secondary | ICD-10-CM | POA: Diagnosis not present

## 2023-01-28 DIAGNOSIS — R6520 Severe sepsis without septic shock: Secondary | ICD-10-CM | POA: Diagnosis not present

## 2023-01-28 DIAGNOSIS — Z86718 Personal history of other venous thrombosis and embolism: Secondary | ICD-10-CM | POA: Diagnosis not present

## 2023-01-28 DIAGNOSIS — L8995 Pressure ulcer of unspecified site, unstageable: Secondary | ICD-10-CM | POA: Diagnosis not present

## 2023-01-28 DIAGNOSIS — L89112 Pressure ulcer of right upper back, stage 2: Secondary | ICD-10-CM | POA: Diagnosis present

## 2023-01-28 DIAGNOSIS — L89100 Pressure ulcer of unspecified part of back, unstageable: Secondary | ICD-10-CM | POA: Diagnosis not present

## 2023-01-28 DIAGNOSIS — Z7901 Long term (current) use of anticoagulants: Secondary | ICD-10-CM | POA: Diagnosis not present

## 2023-01-28 DIAGNOSIS — R278 Other lack of coordination: Secondary | ICD-10-CM | POA: Diagnosis not present

## 2023-01-28 LAB — BASIC METABOLIC PANEL
Anion gap: 7 (ref 5–15)
BUN: 5 mg/dL — ABNORMAL LOW (ref 8–23)
CO2: 28 mmol/L (ref 22–32)
Calcium: 8.4 mg/dL — ABNORMAL LOW (ref 8.9–10.3)
Chloride: 102 mmol/L (ref 98–111)
Creatinine, Ser: 0.78 mg/dL (ref 0.61–1.24)
GFR, Estimated: >60 mL/min (ref >60)
Glucose, Bld: 135 mg/dL — ABNORMAL HIGH (ref 70–99)
Potassium: 3.3 mmol/L — ABNORMAL LOW (ref 3.5–5.1)
Sodium: 137 mmol/L (ref 135–145)

## 2023-01-28 LAB — GLUCOSE, CAPILLARY
Glucose-Capillary: 129 mg/dL — ABNORMAL HIGH (ref 70–99)
Glucose-Capillary: 97 mg/dL (ref 70–99)

## 2023-01-28 LAB — CBC
HCT: 26 % — ABNORMAL LOW (ref 39.0–52.0)
Hemoglobin: 7.8 g/dL — ABNORMAL LOW (ref 13.0–17.0)
MCH: 26.5 pg (ref 26.0–34.0)
MCHC: 30 g/dL (ref 30.0–36.0)
MCV: 88.4 fL (ref 80.0–100.0)
Platelets: 189 10*3/uL (ref 150–400)
RBC: 2.94 MIL/uL — ABNORMAL LOW (ref 4.22–5.81)
RDW: 19.6 % — ABNORMAL HIGH (ref 11.5–15.5)
WBC: 6.9 10*3/uL (ref 4.0–10.5)
nRBC: 0 % (ref 0.0–0.2)

## 2023-01-28 MED ORDER — POTASSIUM CHLORIDE CRYS ER 20 MEQ PO TBCR
40.0000 meq | EXTENDED_RELEASE_TABLET | Freq: Once | ORAL | Status: AC
  Administered 2023-01-28: 40 meq via ORAL
  Filled 2023-01-28: qty 2

## 2023-01-28 NOTE — Progress Notes (Signed)
Mobility Specialist Progress Note:   01/28/23 1130  Mobility  Activity Stood at bedside  Level of Assistance +2 (takes two people)  Location manager Ambulated (ft) 0 ft  Activity Response Tolerated well  Mobility Referral Yes  $Mobility charge 1 Mobility  Mobility Specialist Start Time (ACUTE ONLY) 1055  Mobility Specialist Stop Time (ACUTE ONLY) 1125  Mobility Specialist Time Calculation (min) (ACUTE ONLY) 30 min    Pt received in bed, agreeable to mobility. MinA to stand with c/o minor dizziness upon standing. STS 2x. Focused on marching during session with some knee buckling noted throughout. Pt bowel incontinent upon standing and assisted with pericare. Pt left in bed with call bell in hand and all needs met.   Leory Plowman  Mobility Specialist Please contact via [employer] office at [phone]

## 2023-01-28 NOTE — Progress Notes (Signed)
Subjective: The patient is alert and pleasant.  He is going to a skilled nursing facility in [place].  Objective: Vital signs in last 24 hours: Temp:  [97.7 F (36.5 C)-98.5 F (36.9 C)] 98.5 F (36.9 C) (07/01 2343) Pulse Rate:  [75-96] 90 (07/01 2343) Resp:  [19-21] 19 (07/01 2343) BP: (129-142)/(73-89) 129/73 (07/01 2343) SpO2:  [91 %-98 %] 91 % (07/01 2343) Estimated body mass index is 27.3 kg/m as calculated from the following:   Height as of this encounter: 6' (1.829 m).   Weight as of this encounter: 91.3 kg.   Intake/Output from previous day: 07/01 0701 - 07/02 0700 In: 704.5 [IV Piggyback:704.5] Out: 2400 [Urine:2400] Intake/Output this shift: No intake/output data recorded.  Physical exam the patient is alert and pleasant.  His lower extremity strength is grossly normal.  His lumbar wound is healing well.  Lab Results: Recent Labs    01/26/23 0340 01/28/23 0530  WBC 7.4 6.9  HGB 7.8* 7.8*  HCT 25.8* 26.0*  PLT 191 189   BMET Recent Labs    01/27/23 0401 01/28/23 0530  NA 135 137  K 3.3* 3.3*  CL 100 102  CO2 27 28  GLUCOSE 219* 135*  BUN 9 5*  CREATININE 0.81 0.78  CALCIUM 8.2* 8.4*    Studies/Results: No results found.  Assessment/Plan: Lumbar discitis, osteomyelitis, vertebral necrosis: The patient is doing better.  He is going to complete his antibiotics.  He should wear his lumbosacral corset when out of bed.  Please have him follow-up with me in about 2 weeks.  LOS: 35 days     Cristi Loron 01/28/2023, 7:50 AM     Patient ID: Jim Ramirez, male   DOB: May 26, 1951, 72 y.o.   MRN: [id number]

## 2023-01-28 NOTE — Plan of Care (Signed)
Problem: Education: Goal: Knowledge of General Education information will improve Description: Including pain rating scale, medication(s)/side effects and non-pharmacologic comfort measures Outcome: Progressing   Problem: Health Behavior/Discharge Planning: Goal: Ability to manage health-related needs will improve Outcome: Progressing   Problem: Clinical Measurements: Goal: Ability to maintain clinical measurements within normal limits will improve Outcome: Progressing Goal: Will remain free from infection Outcome: Progressing Goal: Diagnostic test results will improve Outcome: Progressing Goal: Respiratory complications will improve Outcome: Progressing Goal: Cardiovascular complication will be avoided Outcome: Progressing   Problem: Activity: Goal: Risk for activity intolerance will decrease Outcome: Progressing   Problem: Nutrition: Goal: Adequate nutrition will be maintained Outcome: Progressing   Problem: Coping: Goal: Level of anxiety will decrease Outcome: Progressing   Problem: Elimination: Goal: Will not experience complications related to bowel motility Outcome: Progressing Goal: Will not experience complications related to urinary retention Outcome: Progressing   Problem: Pain Managment: Goal: General experience of comfort will improve Outcome: Progressing   Problem: Safety: Goal: Ability to remain free from injury will improve Outcome: Progressing   Problem: Skin Integrity: Goal: Risk for impaired skin integrity will decrease Outcome: Progressing   Problem: Education: Goal: Knowledge of disease or condition will improve Outcome: Progressing Goal: Understanding of medication regimen will improve Outcome: Progressing Goal: Individualized Educational Video(s) Outcome: Progressing   Problem: Activity: Goal: Ability to tolerate increased activity will improve Outcome: Progressing   Problem: Cardiac: Goal: Ability to achieve and maintain  adequate cardiopulmonary perfusion will improve Outcome: Progressing   Problem: Health Behavior/Discharge Planning: Goal: Ability to safely manage health-related needs after discharge will improve Outcome: Progressing   Problem: Education: Goal: Ability to describe self-care measures that may prevent or decrease complications (Diabetes Survival Skills Education) will improve Outcome: Progressing Goal: Individualized Educational Video(s) Outcome: Progressing   Problem: Coping: Goal: Ability to adjust to condition or change in health will improve Outcome: Progressing   Problem: Fluid Volume: Goal: Ability to maintain a balanced intake and output will improve Outcome: Progressing   Problem: Health Behavior/Discharge Planning: Goal: Ability to identify and utilize available resources and services will improve Outcome: Progressing Goal: Ability to manage health-related needs will improve Outcome: Progressing   Problem: Metabolic: Goal: Ability to maintain appropriate glucose levels will improve Outcome: Progressing   Problem: Nutritional: Goal: Maintenance of adequate nutrition will improve Outcome: Progressing Goal: Progress toward achieving an optimal weight will improve Outcome: Progressing   Problem: Skin Integrity: Goal: Risk for impaired skin integrity will decrease Outcome: Progressing   Problem: Tissue Perfusion: Goal: Adequacy of tissue perfusion will improve Outcome: Progressing   Problem: Education: Goal: Understanding of CV disease, CV risk reduction, and recovery process will improve Outcome: Progressing Goal: Individualized Educational Video(s) Outcome: Progressing   Problem: Activity: Goal: Ability to return to baseline activity level will improve Outcome: Progressing   Problem: Cardiovascular: Goal: Ability to achieve and maintain adequate cardiovascular perfusion will improve Outcome: Progressing Goal: Vascular access site(s) Level 0-1 will be  maintained Outcome: Progressing   Problem: Health Behavior/Discharge Planning: Goal: Ability to safely manage health-related needs after discharge will improve Outcome: Progressing   Problem: Education: Goal: Required Educational Video(s) Outcome: Progressing   Problem: Clinical Measurements: Goal: Ability to maintain clinical measurements within normal limits will improve Outcome: Progressing Goal: Postoperative complications will be avoided or minimized Outcome: Progressing   Problem: Skin Integrity: Goal: Demonstration of wound healing without infection will improve Outcome: Progressing   Problem: Education: Goal: Ability to verbalize activity precautions or restrictions will improve Outcome: Progressing Goal: Knowledge  of the prescribed therapeutic regimen will improve Outcome: Progressing Goal: Understanding of discharge needs will improve Outcome: Progressing

## 2023-01-28 NOTE — Progress Notes (Signed)
Patient seen and examined at the bedside today.  Lying in bed.  Appears comfortable.  Hemodynamically stable.  Alert and oriented.  Denies any new complaints today.  Medically stable for discharge whenever possible.  Discharge orders and summary were already placed on 7/1.  No new change in the medical management.  I called his sister and son for update about discharge planning, calls not received

## 2023-01-28 NOTE — TOC Transition Note (Signed)
Transition of Care [facility]) - CM/SW Discharge Note   Patient Details  Name: Jim Ramirez MRN: [id number] Date of Birth: 1950-11-11  Transition of Care [facility] CM/SW Contact:  Leander Rams, LCSW Phone Number: 01/28/2023, 11:44 AM   Clinical Narrative:    Patient will DC to: [facility] SNF Anticipated DC date: 01/28/2023 Family notified: Patient Transport by: Sharin Mons   Per MD patient ready for DC to [facility]. RN, patient, patient's family, and facility notified of DC. Discharge Summary and FL2 sent to facility. RN to call report prior to discharge [phone]. DC packet on chart. Ambulance transport requested for patient.   CSW will sign off for now as social work intervention is no longer needed. Please consult [place] again if new needs arise.    Final next level of care: Skilled Nursing Facility Barriers to Discharge: No Barriers Identified   Patient Goals and CMS Choice      Discharge Placement                Patient chooses bed at:  [facility]) Patient to be transferred to facility by: [facility] Name of family member notified: Patient Patient and family notified of of transfer: 01/28/23  Discharge Plan and Services Additional resources added to the After Visit Summary for                                       Social Determinants of Health (SDOH) Interventions SDOH Screenings   Transportation Needs: No Transportation Needs (12/26/2022)  Utilities: Not At Risk (12/26/2022)  Tobacco Use: Medium Risk (01/21/2023)     Readmission Risk Interventions     No data to display           Oletta Lamas, MSW, LCSWA, LCASA Transitions of Care  Clinical Social Worker I

## 2023-01-29 DIAGNOSIS — N179 Acute kidney failure, unspecified: Secondary | ICD-10-CM | POA: Diagnosis not present

## 2023-01-29 DIAGNOSIS — E785 Hyperlipidemia, unspecified: Secondary | ICD-10-CM | POA: Diagnosis not present

## 2023-01-29 DIAGNOSIS — D509 Iron deficiency anemia, unspecified: Secondary | ICD-10-CM | POA: Diagnosis not present

## 2023-01-29 DIAGNOSIS — M879 Osteonecrosis, unspecified: Secondary | ICD-10-CM | POA: Diagnosis not present

## 2023-01-29 DIAGNOSIS — G062 Extradural and subdural abscess, unspecified: Secondary | ICD-10-CM | POA: Diagnosis not present

## 2023-01-29 DIAGNOSIS — E876 Hypokalemia: Secondary | ICD-10-CM | POA: Diagnosis not present

## 2023-01-29 DIAGNOSIS — I480 Paroxysmal atrial fibrillation: Secondary | ICD-10-CM | POA: Diagnosis not present

## 2023-01-29 DIAGNOSIS — M659 Synovitis and tenosynovitis, unspecified: Secondary | ICD-10-CM | POA: Diagnosis not present

## 2023-01-31 DIAGNOSIS — I480 Paroxysmal atrial fibrillation: Secondary | ICD-10-CM | POA: Diagnosis not present

## 2023-01-31 DIAGNOSIS — N179 Acute kidney failure, unspecified: Secondary | ICD-10-CM | POA: Diagnosis not present

## 2023-01-31 DIAGNOSIS — R799 Abnormal finding of blood chemistry, unspecified: Secondary | ICD-10-CM | POA: Diagnosis not present

## 2023-01-31 DIAGNOSIS — G062 Extradural and subdural abscess, unspecified: Secondary | ICD-10-CM | POA: Diagnosis not present

## 2023-01-31 DIAGNOSIS — E1165 Type 2 diabetes mellitus with hyperglycemia: Secondary | ICD-10-CM | POA: Diagnosis not present

## 2023-01-31 DIAGNOSIS — I10 Essential (primary) hypertension: Secondary | ICD-10-CM | POA: Diagnosis not present

## 2023-01-31 NOTE — Progress Notes (Signed)
Palliative Medicine Progress Note   Patient Name: Jim Ramirez       Date: 01/31/2023 DOB: Jan 25, 1951  Age: 72 y.o. MRN#: [id number] Attending Physician: No att. providers found Primary Care Physician: [name], MD Admit Date: 12/24/2022  Reason for Consultation/Follow-up: {Reason for Consult:23484}  HPI/Patient Profile: 72 y.o. male  with past medical history of DM and HTN who initially presented to [facility] on 12/22/2022 with complaint of back pain with radiation down both legs.  He was found to have MSSA bacteremia and osteonecrosis of the lumbar spine.  He was also found to have new onset A-fib with RVR.  He was transferred to [facility] on 12/24/2022 for further management of severe sepsis.  Palliative Medicine was consulted for goals of care conversations.  Subjective: Chart reviewed.   Objective:  Physical Exam          Vital Signs: BP (!) 127/109   Pulse 90   Temp 98.6 F (37 C) (Oral)   Resp 20   Ht 6' (1.829 m)   Wt 91.3 kg   SpO2 93%   BMI 27.30 kg/m  SpO2: SpO2: 93 % O2 Device: O2 Device: Room Air O2 Flow Rate: O2 Flow Rate (L/min): 2 L/min  Intake/output summary: No intake or output data in the 24 hours ending 01/31/23 0819  LBM: Last BM Date : 01/27/23     Palliative Assessment/Data: ***     Palliative Medicine Assessment & Plan   Assessment: Principal Problem:   Discitis of lumbar region Active Problems:   Diabetes mellitus type 2 in nonobese (HCC)   Dyslipidemia   Essential hypertension   New onset atrial fibrillation (HCC)   Sepsis (HCC)   BPH (benign prostatic hyperplasia)   Mood disorder (HCC)   MSSA bacteremia   Malnutrition of moderate degree    Recommendations/Plan: ***  Goals of Care and Additional Recommendations: Limitations  on Scope of Treatment: {Recommended Scope and Preferences:21019}  Code Status:   Prognosis:  {Palliative Care Prognosis:23504}  Discharge Planning: {Palliative dispostion:23505}  Care plan was discussed with ***  Thank you for allowing the Palliative Medicine Team to assist in the care of this patient.   ***   Merry Proud, NP   Please contact Palliative Medicine Team phone at [phone] for questions and concerns.  For individual providers, please  see AMION.

## 2023-02-03 DIAGNOSIS — E7800 Pure hypercholesterolemia, unspecified: Secondary | ICD-10-CM | POA: Diagnosis not present

## 2023-02-03 DIAGNOSIS — R600 Localized edema: Secondary | ICD-10-CM | POA: Diagnosis not present

## 2023-02-03 DIAGNOSIS — E1165 Type 2 diabetes mellitus with hyperglycemia: Secondary | ICD-10-CM | POA: Diagnosis not present

## 2023-02-03 DIAGNOSIS — E119 Type 2 diabetes mellitus without complications: Secondary | ICD-10-CM | POA: Diagnosis not present

## 2023-02-03 DIAGNOSIS — G062 Extradural and subdural abscess, unspecified: Secondary | ICD-10-CM | POA: Diagnosis not present

## 2023-02-04 ENCOUNTER — Ambulatory Visit: Payer: Medicare HMO | Attending: Student | Admitting: Student

## 2023-02-04 ENCOUNTER — Encounter: Payer: Self-pay | Admitting: Student

## 2023-02-04 VITALS — BP 122/64 | HR 78 | Ht 72.0 in

## 2023-02-04 DIAGNOSIS — I10 Essential (primary) hypertension: Secondary | ICD-10-CM

## 2023-02-04 DIAGNOSIS — E785 Hyperlipidemia, unspecified: Secondary | ICD-10-CM

## 2023-02-04 DIAGNOSIS — Z86718 Personal history of other venous thrombosis and embolism: Secondary | ICD-10-CM

## 2023-02-04 DIAGNOSIS — D509 Iron deficiency anemia, unspecified: Secondary | ICD-10-CM | POA: Diagnosis not present

## 2023-02-04 DIAGNOSIS — M4646 Discitis, unspecified, lumbar region: Secondary | ICD-10-CM

## 2023-02-04 DIAGNOSIS — I480 Paroxysmal atrial fibrillation: Secondary | ICD-10-CM

## 2023-02-04 DIAGNOSIS — R600 Localized edema: Secondary | ICD-10-CM

## 2023-02-04 DIAGNOSIS — F39 Unspecified mood [affective] disorder: Secondary | ICD-10-CM | POA: Diagnosis not present

## 2023-02-04 DIAGNOSIS — R0609 Other forms of dyspnea: Secondary | ICD-10-CM | POA: Diagnosis not present

## 2023-02-04 NOTE — Progress Notes (Signed)
Cardiology Office Note    Date:  02/04/2023  ID:  ELVEN LABOY, DOB [birth year], MRN [id number] Cardiologist: Dina Rich, MD    History of Present Illness:    Jim Ramirez is a 72 y.o. male with past medical history of HTN, HLD, Type 2 DM, OSA and history of spontaneous subarachnoid intracranial hemorrhage who presents to the office today for hospital follow-up.  He was most recently admitted to [facility] on 12/24/2022 as a transfer from [facility] after having been diagnosed with osteomyelitis/discitis and epidural abscess. Was also found to have MSSA bacteremia and meeting criteria for sepsis. Cardiology was consulted as he was found to be in new-onset atrial fibrillation with RVR and troponin values were elevated, peaking at 227. He did undergo TEE at the recommendation of ID given his bacteremia and this was performed on 12/31/2022 and showed no evidence of vegetations. In regards to his arrhythmia, he was initially receiving IV Amiodarone and this was transitioned to 400 mg twice daily during admission as he converted to normal sinus rhythm. It was recommended to reduce to 200 mg daily on 01/08/2023 and to start anticoagulation once cleared to do so by Neurosurgery. Ultimately required lumbar laminectomy and evacuation of his abscess on 6/24. It appears he was also diagnosed with a left lower extremity DVT and underwent IVC filter placement by IR. Was ultimately discharged to SNF on 01/27/2023.  In talking with the patient and one of his caregivers from [facility] today, he reports things have overall been going well at rehab. He is working with PT and is able to ambulate with a walker at times. He is currently in a wheelchair today. He denies any recent chest pain or dyspnea on exertion. Says he was previously having dyspnea on exertion with walking prior to admission and would have diaphoresis with this. No recent palpitations, orthopnea or PND. He has experienced lower extremity  edema since hospital discharge despite taking Lasix 20 mg daily and reports good urination with this.  Studies Reviewed:   EKG: EKG is not ordered today.  NST: 07/2022   Patient exercised for 6 minutes and 6 seconds, according to Bruce protocol, achieving 7.0 METS. HR increased to 133 bpm which is 88% of maximum predicted HR. Patient has average exercise capacity.   No chest pain during the test   Stress ECG is negative for ischemia.   LV perfusion is normal. There is no evidence of current ischemia or prior infarction.   Left ventricular function is normal. Nuclear stress EF: 65 %.   The study is normal. The study is low risk.  Echocardiogram: 11/2022 IMPRESSIONS     1. Left ventricular ejection fraction, by estimation, is 65 to 70%. The  left ventricle has normal function. Left ventricular endocardial border  not optimally defined to evaluate regional wall motion. Indeterminate  diastolic filling due to E-A fusion.   2. Right ventricular systolic function is hyperdynamic. The right  ventricular size is not well visualized. Tricuspid regurgitation signal is  inadequate for assessing PA pressure.   3. The mitral valve was not well visualized. No evidence of mitral valve  regurgitation.   4. The aortic valve was not well visualized. Aortic valve regurgitation  is not visualized. No aortic stenosis is present.   5. The inferior vena cava is dilated in size with >50% respiratory  variability, suggesting right atrial pressure of 8 mmHg.   6. Technically difficult study.   Comparison(s): No prior Echocardiogram.  Risk Assessment/Calculations:   This patients CHA2DS2-VASc Score and unadjusted Ischemic Stroke Rate (% per year) is equal to 9.7 % stroke rate/year from a score of 6 (HTN, DM, Vascular, Age, History of DVT (2))  Physical Exam:   VS:  BP 122/64   Pulse 78   Ht 6' (1.829 m)   SpO2 96%   BMI 27.30 kg/m    Wt Readings from Last 3 Encounters:  01/19/23 201 lb 4.5 oz  (91.3 kg)  10/07/22 208 lb (94.3 kg)  09/30/22 207 lb (93.9 kg)     GEN: Pleasant male appearing in no acute distress. Sitting in wheelchair.  NECK: No JVD; No carotid bruits CARDIAC: RRR, no murmurs, rubs, gallops RESPIRATORY:  Clear to auscultation without rales, wheezing or rhonchi  ABDOMEN: Appears non-distended. No obvious abdominal masses. EXTREMITIES: No clubbing or cyanosis. 2+ pitting edema up to mid-shins bilaterally.  Distal pedal pulses are 2+ bilaterally.   Assessment and Plan:   1. New-onset Atrial Fibrillation - This was diagnosed during his recent admission and likely secondary to his acute illness at that time. He was started on IV Amiodarone did convert back to normal sinus rhythm with this. He is currently taking Amiodarone 200 mg daily and will continue for now. Could possibly consider stopping at the time of his next office visit if he continues to maintain normal sinus rhythm and is further recovered from his recent epidural abscess/osteomyelitis/bacteremia. Will also continue Lopressor 12.5 mg twice daily and Eliquis 5 mg twice daily for anticoagulation.  2. Lower Extremity Edema - Lower extremity edema was noted during his hospitalization but he does have 2+ pitting edema on examination today. Suspect this is likely dependent given that he is mostly in a wheelchair but is working with PT.  He is currently taking Lasix 20 mg daily and I recommended that they increase this to 40 mg daily for 1 week then resume 20 mg daily. Will ask for a copy of his next BMET to be sent to the office for review (having weekly labs at this time). Creatinine was stable at 0.78 when checked on 01/28/2023.  3. Dyspnea on Exertion  - He reports having dyspnea on exertion prior to his recent hospitalization which led to prior stress testing being obtained which was low-risk. He is unable to assess his current dyspnea given that activity is limited.  We reviewed that if he has worsening dyspnea on  exertion despite recovering from his acute illness, we could consider further evaluation with a Coronary CTA as he reports his dyspnea was worse with walking and with associated diaphoresis but did not experience chest pain.   4. HTN - BP is well-controlled at 122/64 during today's visit. Continue current medical therapy with Lopressor 12.5 mg twice daily.  5. HLD - Followed by his PCP.  He remains on Crestor 20 mg daily.  6. DVT - Received full dose Lovenox during admission given his surgical procedures and was transitioned to Eliquis at the time of discharge. He did undergo IVC filter placement by IR.  7. Epidural Abscess/Osteomyelitis/Discitis/MSSA Bacteremia -  Remains on IV antibiotics. Being managed by ID and has upcoming follow-up on 02/18/2023.   Signed, Ellsworth Lennox, PA-C

## 2023-02-04 NOTE — Patient Instructions (Signed)
Medication Instructions:   Increase Lasix to 40 mg Daily for 7 Days then resume 20 mg Lasix Daily   *If you need a refill on your cardiac medications before your next appointment, please call your pharmacy*   Lab Work: NONE   If you have labs (blood work) drawn today and your tests are completely normal, you will receive your results only by: MyChart Message (if you have MyChart) OR A paper copy in the mail If you have any lab test that is abnormal or we need to change your treatment, we will call you to review the results.   Testing/Procedures: NONE    Follow-Up: At [facility], you and your health needs are our priority.  As part of our continuing mission to provide you with exceptional heart care, we have created designated Provider Care Teams.  These Care Teams include your primary Cardiologist (physician) and Advanced Practice Providers (APPs -  Physician Assistants and Nurse Practitioners) who all work together to provide you with the care you need, when you need it.  We recommend signing up for the patient portal called "MyChart".  Sign up information is provided on this After Visit Summary.  MyChart is used to connect with patients for Virtual Visits (Telemedicine).  Patients are able to view lab/test results, encounter notes, upcoming appointments, etc.  Non-urgent messages can be sent to your provider as well.   To learn more about what you can do with MyChart, go to [email or web address].    Your next appointment:   2 -3  month(s)  Provider:   You may see Dina Rich, MD or one of the following Advanced Practice Providers on your designated Care Team:   Randall An, PA-C  Jacolyn Reedy, [place]     Other Instructions Thank you for choosing [facility]!

## 2023-02-05 DIAGNOSIS — I480 Paroxysmal atrial fibrillation: Secondary | ICD-10-CM | POA: Diagnosis not present

## 2023-02-05 DIAGNOSIS — M879 Osteonecrosis, unspecified: Secondary | ICD-10-CM | POA: Diagnosis not present

## 2023-02-05 DIAGNOSIS — N179 Acute kidney failure, unspecified: Secondary | ICD-10-CM | POA: Diagnosis not present

## 2023-02-05 DIAGNOSIS — G062 Extradural and subdural abscess, unspecified: Secondary | ICD-10-CM | POA: Diagnosis not present

## 2023-02-05 DIAGNOSIS — D509 Iron deficiency anemia, unspecified: Secondary | ICD-10-CM | POA: Diagnosis not present

## 2023-02-05 DIAGNOSIS — E785 Hyperlipidemia, unspecified: Secondary | ICD-10-CM | POA: Diagnosis not present

## 2023-02-05 DIAGNOSIS — R600 Localized edema: Secondary | ICD-10-CM | POA: Diagnosis not present

## 2023-02-06 DIAGNOSIS — I82402 Acute embolism and thrombosis of unspecified deep veins of left lower extremity: Secondary | ICD-10-CM | POA: Diagnosis not present

## 2023-02-06 DIAGNOSIS — M659 Synovitis and tenosynovitis, unspecified: Secondary | ICD-10-CM | POA: Diagnosis not present

## 2023-02-06 DIAGNOSIS — B9561 Methicillin susceptible Staphylococcus aureus infection as the cause of diseases classified elsewhere: Secondary | ICD-10-CM | POA: Diagnosis not present

## 2023-02-06 DIAGNOSIS — M4646 Discitis, unspecified, lumbar region: Secondary | ICD-10-CM | POA: Diagnosis not present

## 2023-02-06 DIAGNOSIS — G062 Extradural and subdural abscess, unspecified: Secondary | ICD-10-CM | POA: Diagnosis not present

## 2023-02-06 DIAGNOSIS — I4891 Unspecified atrial fibrillation: Secondary | ICD-10-CM | POA: Diagnosis not present

## 2023-02-06 DIAGNOSIS — M879 Osteonecrosis, unspecified: Secondary | ICD-10-CM | POA: Diagnosis not present

## 2023-02-06 DIAGNOSIS — R7881 Bacteremia: Secondary | ICD-10-CM | POA: Diagnosis not present

## 2023-02-07 DIAGNOSIS — M879 Osteonecrosis, unspecified: Secondary | ICD-10-CM | POA: Diagnosis not present

## 2023-02-07 DIAGNOSIS — B9561 Methicillin susceptible Staphylococcus aureus infection as the cause of diseases classified elsewhere: Secondary | ICD-10-CM | POA: Diagnosis not present

## 2023-02-07 DIAGNOSIS — G062 Extradural and subdural abscess, unspecified: Secondary | ICD-10-CM | POA: Diagnosis not present

## 2023-02-07 DIAGNOSIS — I4891 Unspecified atrial fibrillation: Secondary | ICD-10-CM | POA: Diagnosis not present

## 2023-02-07 DIAGNOSIS — M4646 Discitis, unspecified, lumbar region: Secondary | ICD-10-CM | POA: Diagnosis not present

## 2023-02-07 DIAGNOSIS — M659 Synovitis and tenosynovitis, unspecified: Secondary | ICD-10-CM | POA: Diagnosis not present

## 2023-02-07 DIAGNOSIS — I82402 Acute embolism and thrombosis of unspecified deep veins of left lower extremity: Secondary | ICD-10-CM | POA: Diagnosis not present

## 2023-02-07 DIAGNOSIS — R7881 Bacteremia: Secondary | ICD-10-CM | POA: Diagnosis not present

## 2023-02-10 DIAGNOSIS — N179 Acute kidney failure, unspecified: Secondary | ICD-10-CM | POA: Diagnosis not present

## 2023-02-10 DIAGNOSIS — R531 Weakness: Secondary | ICD-10-CM | POA: Diagnosis not present

## 2023-02-10 DIAGNOSIS — G062 Extradural and subdural abscess, unspecified: Secondary | ICD-10-CM | POA: Diagnosis not present

## 2023-02-10 DIAGNOSIS — I10 Essential (primary) hypertension: Secondary | ICD-10-CM | POA: Diagnosis not present

## 2023-02-10 DIAGNOSIS — R600 Localized edema: Secondary | ICD-10-CM | POA: Diagnosis not present

## 2023-02-11 DIAGNOSIS — R7881 Bacteremia: Secondary | ICD-10-CM | POA: Diagnosis not present

## 2023-02-11 DIAGNOSIS — R52 Pain, unspecified: Secondary | ICD-10-CM | POA: Diagnosis not present

## 2023-02-11 DIAGNOSIS — B379 Candidiasis, unspecified: Secondary | ICD-10-CM | POA: Diagnosis not present

## 2023-02-12 DIAGNOSIS — M4646 Discitis, unspecified, lumbar region: Secondary | ICD-10-CM | POA: Diagnosis not present

## 2023-02-12 DIAGNOSIS — N179 Acute kidney failure, unspecified: Secondary | ICD-10-CM | POA: Diagnosis not present

## 2023-02-12 DIAGNOSIS — R531 Weakness: Secondary | ICD-10-CM | POA: Diagnosis not present

## 2023-02-12 DIAGNOSIS — R600 Localized edema: Secondary | ICD-10-CM | POA: Diagnosis not present

## 2023-02-14 DIAGNOSIS — R600 Localized edema: Secondary | ICD-10-CM | POA: Diagnosis not present

## 2023-02-14 DIAGNOSIS — N179 Acute kidney failure, unspecified: Secondary | ICD-10-CM | POA: Diagnosis not present

## 2023-02-14 DIAGNOSIS — M4646 Discitis, unspecified, lumbar region: Secondary | ICD-10-CM | POA: Diagnosis not present

## 2023-02-14 DIAGNOSIS — R531 Weakness: Secondary | ICD-10-CM | POA: Diagnosis not present

## 2023-02-17 DIAGNOSIS — I10 Essential (primary) hypertension: Secondary | ICD-10-CM | POA: Diagnosis not present

## 2023-02-17 DIAGNOSIS — M4646 Discitis, unspecified, lumbar region: Secondary | ICD-10-CM | POA: Diagnosis not present

## 2023-02-17 DIAGNOSIS — N179 Acute kidney failure, unspecified: Secondary | ICD-10-CM | POA: Diagnosis not present

## 2023-02-17 DIAGNOSIS — R600 Localized edema: Secondary | ICD-10-CM | POA: Diagnosis not present

## 2023-02-17 DIAGNOSIS — R531 Weakness: Secondary | ICD-10-CM | POA: Diagnosis not present

## 2023-02-18 ENCOUNTER — Inpatient Hospital Stay: Payer: Medicare HMO | Admitting: Internal Medicine

## 2023-02-19 DIAGNOSIS — F329 Major depressive disorder, single episode, unspecified: Secondary | ICD-10-CM | POA: Diagnosis not present

## 2023-02-19 DIAGNOSIS — N179 Acute kidney failure, unspecified: Secondary | ICD-10-CM | POA: Diagnosis not present

## 2023-02-19 DIAGNOSIS — F319 Bipolar disorder, unspecified: Secondary | ICD-10-CM | POA: Diagnosis not present

## 2023-02-19 DIAGNOSIS — E441 Mild protein-calorie malnutrition: Secondary | ICD-10-CM | POA: Diagnosis not present

## 2023-02-19 DIAGNOSIS — I482 Chronic atrial fibrillation, unspecified: Secondary | ICD-10-CM | POA: Diagnosis not present

## 2023-02-19 DIAGNOSIS — L89890 Pressure ulcer of other site, unstageable: Secondary | ICD-10-CM | POA: Diagnosis not present

## 2023-02-19 DIAGNOSIS — M4646 Discitis, unspecified, lumbar region: Secondary | ICD-10-CM | POA: Diagnosis not present

## 2023-02-19 DIAGNOSIS — R531 Weakness: Secondary | ICD-10-CM | POA: Diagnosis not present

## 2023-02-19 DIAGNOSIS — R2689 Other abnormalities of gait and mobility: Secondary | ICD-10-CM | POA: Diagnosis not present

## 2023-02-19 DIAGNOSIS — M6281 Muscle weakness (generalized): Secondary | ICD-10-CM | POA: Diagnosis not present

## 2023-02-19 DIAGNOSIS — R600 Localized edema: Secondary | ICD-10-CM | POA: Diagnosis not present

## 2023-02-19 DIAGNOSIS — R2681 Unsteadiness on feet: Secondary | ICD-10-CM | POA: Diagnosis not present

## 2023-02-19 DIAGNOSIS — E119 Type 2 diabetes mellitus without complications: Secondary | ICD-10-CM | POA: Diagnosis not present

## 2023-02-20 DIAGNOSIS — M4646 Discitis, unspecified, lumbar region: Secondary | ICD-10-CM | POA: Diagnosis not present

## 2023-02-20 DIAGNOSIS — L8995 Pressure ulcer of unspecified site, unstageable: Secondary | ICD-10-CM | POA: Diagnosis not present

## 2023-02-20 DIAGNOSIS — R531 Weakness: Secondary | ICD-10-CM | POA: Diagnosis not present

## 2023-02-22 ENCOUNTER — Emergency Department (HOSPITAL_COMMUNITY): Payer: Medicare HMO

## 2023-02-22 ENCOUNTER — Encounter (HOSPITAL_COMMUNITY): Payer: Self-pay

## 2023-02-22 ENCOUNTER — Other Ambulatory Visit: Payer: Self-pay

## 2023-02-22 ENCOUNTER — Inpatient Hospital Stay (HOSPITAL_COMMUNITY)
Admission: EM | Admit: 2023-02-22 | Discharge: 2023-02-26 | DRG: 673 | Disposition: A | Discharge status: Skilled Nursing Facility | Payer: Medicare HMO | Source: Skilled Nursing Facility | Attending: Family Medicine | Admitting: Family Medicine

## 2023-02-22 DIAGNOSIS — Z87442 Personal history of urinary calculi: Secondary | ICD-10-CM

## 2023-02-22 DIAGNOSIS — Z781 Physical restraint status: Secondary | ICD-10-CM

## 2023-02-22 DIAGNOSIS — I480 Paroxysmal atrial fibrillation: Secondary | ICD-10-CM | POA: Diagnosis present

## 2023-02-22 DIAGNOSIS — N390 Urinary tract infection, site not specified: Principal | ICD-10-CM | POA: Diagnosis present

## 2023-02-22 DIAGNOSIS — E876 Hypokalemia: Secondary | ICD-10-CM | POA: Diagnosis present

## 2023-02-22 DIAGNOSIS — R4182 Altered mental status, unspecified: Secondary | ICD-10-CM | POA: Diagnosis not present

## 2023-02-22 DIAGNOSIS — B9689 Other specified bacterial agents as the cause of diseases classified elsewhere: Secondary | ICD-10-CM | POA: Diagnosis present

## 2023-02-22 DIAGNOSIS — R531 Weakness: Principal | ICD-10-CM

## 2023-02-22 DIAGNOSIS — E440 Moderate protein-calorie malnutrition: Secondary | ICD-10-CM | POA: Diagnosis present

## 2023-02-22 DIAGNOSIS — Z6826 Body mass index (BMI) 26.0-26.9, adult: Secondary | ICD-10-CM

## 2023-02-22 DIAGNOSIS — E11649 Type 2 diabetes mellitus with hypoglycemia without coma: Secondary | ICD-10-CM | POA: Diagnosis not present

## 2023-02-22 DIAGNOSIS — E871 Hypo-osmolality and hyponatremia: Secondary | ICD-10-CM | POA: Diagnosis present

## 2023-02-22 DIAGNOSIS — E870 Hyperosmolality and hypernatremia: Secondary | ICD-10-CM | POA: Diagnosis present

## 2023-02-22 DIAGNOSIS — Z1152 Encounter for screening for COVID-19: Secondary | ICD-10-CM

## 2023-02-22 DIAGNOSIS — Z743 Need for continuous supervision: Secondary | ICD-10-CM | POA: Diagnosis not present

## 2023-02-22 DIAGNOSIS — Z8661 Personal history of infections of the central nervous system: Secondary | ICD-10-CM

## 2023-02-22 DIAGNOSIS — Z794 Long term (current) use of insulin: Secondary | ICD-10-CM

## 2023-02-22 DIAGNOSIS — Z86718 Personal history of other venous thrombosis and embolism: Secondary | ICD-10-CM

## 2023-02-22 DIAGNOSIS — K219 Gastro-esophageal reflux disease without esophagitis: Secondary | ICD-10-CM | POA: Diagnosis present

## 2023-02-22 DIAGNOSIS — Z66 Do not resuscitate: Secondary | ICD-10-CM | POA: Diagnosis present

## 2023-02-22 DIAGNOSIS — R9389 Abnormal findings on diagnostic imaging of other specified body structures: Secondary | ICD-10-CM | POA: Diagnosis not present

## 2023-02-22 DIAGNOSIS — R296 Repeated falls: Secondary | ICD-10-CM | POA: Diagnosis present

## 2023-02-22 DIAGNOSIS — Z8673 Personal history of transient ischemic attack (TIA), and cerebral infarction without residual deficits: Secondary | ICD-10-CM

## 2023-02-22 DIAGNOSIS — G8929 Other chronic pain: Secondary | ICD-10-CM | POA: Diagnosis present

## 2023-02-22 DIAGNOSIS — Z9841 Cataract extraction status, right eye: Secondary | ICD-10-CM

## 2023-02-22 DIAGNOSIS — I4510 Unspecified right bundle-branch block: Secondary | ICD-10-CM | POA: Diagnosis present

## 2023-02-22 DIAGNOSIS — K269 Duodenal ulcer, unspecified as acute or chronic, without hemorrhage or perforation: Secondary | ICD-10-CM | POA: Diagnosis present

## 2023-02-22 DIAGNOSIS — Z87891 Personal history of nicotine dependence: Secondary | ICD-10-CM

## 2023-02-22 DIAGNOSIS — B965 Pseudomonas (aeruginosa) (mallei) (pseudomallei) as the cause of diseases classified elsewhere: Secondary | ICD-10-CM | POA: Diagnosis present

## 2023-02-22 DIAGNOSIS — Z7901 Long term (current) use of anticoagulants: Secondary | ICD-10-CM

## 2023-02-22 DIAGNOSIS — I10 Essential (primary) hypertension: Secondary | ICD-10-CM | POA: Diagnosis present

## 2023-02-22 DIAGNOSIS — L89626 Pressure-induced deep tissue damage of left heel: Secondary | ICD-10-CM | POA: Diagnosis present

## 2023-02-22 DIAGNOSIS — L89112 Pressure ulcer of right upper back, stage 2: Secondary | ICD-10-CM | POA: Insufficient documentation

## 2023-02-22 DIAGNOSIS — Z981 Arthrodesis status: Secondary | ICD-10-CM

## 2023-02-22 DIAGNOSIS — Z833 Family history of diabetes mellitus: Secondary | ICD-10-CM

## 2023-02-22 DIAGNOSIS — R443 Hallucinations, unspecified: Secondary | ICD-10-CM | POA: Diagnosis not present

## 2023-02-22 DIAGNOSIS — R5381 Other malaise: Secondary | ICD-10-CM | POA: Diagnosis not present

## 2023-02-22 DIAGNOSIS — Z961 Presence of intraocular lens: Secondary | ICD-10-CM | POA: Diagnosis present

## 2023-02-22 DIAGNOSIS — Z8249 Family history of ischemic heart disease and other diseases of the circulatory system: Secondary | ICD-10-CM

## 2023-02-22 DIAGNOSIS — Z95828 Presence of other vascular implants and grafts: Secondary | ICD-10-CM

## 2023-02-22 DIAGNOSIS — E119 Type 2 diabetes mellitus without complications: Secondary | ICD-10-CM

## 2023-02-22 DIAGNOSIS — Z79899 Other long term (current) drug therapy: Secondary | ICD-10-CM

## 2023-02-22 DIAGNOSIS — G9341 Metabolic encephalopathy: Secondary | ICD-10-CM | POA: Diagnosis present

## 2023-02-22 DIAGNOSIS — Z9842 Cataract extraction status, left eye: Secondary | ICD-10-CM

## 2023-02-22 DIAGNOSIS — E785 Hyperlipidemia, unspecified: Secondary | ICD-10-CM | POA: Diagnosis present

## 2023-02-22 DIAGNOSIS — L89109 Pressure ulcer of unspecified part of back, unspecified stage: Secondary | ICD-10-CM | POA: Insufficient documentation

## 2023-02-22 DIAGNOSIS — M549 Dorsalgia, unspecified: Secondary | ICD-10-CM | POA: Diagnosis present

## 2023-02-22 LAB — RENAL FUNCTION PANEL
Albumin: 1.7 g/dL — ABNORMAL LOW (ref 3.5–5.0)
Anion gap: 10 (ref 5–15)
BUN: 12 mg/dL (ref 8–23)
CO2: 29 mmol/L (ref 22–32)
Calcium: 7.5 mg/dL — ABNORMAL LOW (ref 8.9–10.3)
Chloride: 108 mmol/L (ref 98–111)
Creatinine, Ser: 0.93 mg/dL (ref 0.61–1.24)
GFR, Estimated: >60 mL/min (ref >60)
Glucose, Bld: 85 mg/dL (ref 70–99)
Phosphorus: 2.7 mg/dL (ref 2.5–4.6)
Potassium: 2.4 mmol/L — CL (ref 3.5–5.1)
Sodium: 147 mmol/L — ABNORMAL HIGH (ref 135–145)

## 2023-02-22 LAB — CBC WITH DIFFERENTIAL/PLATELET
Abs Immature Granulocytes: 0.06 10*3/uL (ref 0.00–0.07)
Basophils Absolute: 0 10*3/uL (ref 0.0–0.1)
Basophils Relative: 0 %
Eosinophils Absolute: 0 10*3/uL (ref 0.0–0.5)
Eosinophils Relative: 0 %
HCT: 28.5 % — ABNORMAL LOW (ref 39.0–52.0)
Hemoglobin: 8.6 g/dL — ABNORMAL LOW (ref 13.0–17.0)
Immature Granulocytes: 1 %
Lymphocytes Relative: 19 %
Lymphs Abs: 2 10*3/uL (ref 0.7–4.0)
MCH: 29 pg (ref 26.0–34.0)
MCHC: 30.2 g/dL (ref 30.0–36.0)
MCV: 96 fL (ref 80.0–100.0)
Monocytes Absolute: 0.7 10*3/uL (ref 0.1–1.0)
Monocytes Relative: 7 %
Neutro Abs: 7.6 10*3/uL (ref 1.7–7.7)
Neutrophils Relative %: 73 %
Platelets: 125 10*3/uL — ABNORMAL LOW (ref 150–400)
RBC: 2.97 MIL/uL — ABNORMAL LOW (ref 4.22–5.81)
RDW: 21 % — ABNORMAL HIGH (ref 11.5–15.5)
WBC: 10.5 10*3/uL (ref 4.0–10.5)
nRBC: 0 % (ref 0.0–0.2)

## 2023-02-22 LAB — COMPREHENSIVE METABOLIC PANEL
ALT: <5 U/L (ref 0–44)
AST: 28 U/L (ref 15–41)
Albumin: 1.8 g/dL — ABNORMAL LOW (ref 3.5–5.0)
Alkaline Phosphatase: 93 U/L (ref 38–126)
Anion gap: 10 (ref 5–15)
BUN: 14 mg/dL (ref 8–23)
CO2: 30 mmol/L (ref 22–32)
Calcium: 7.5 mg/dL — ABNORMAL LOW (ref 8.9–10.3)
Chloride: 104 mmol/L (ref 98–111)
Creatinine, Ser: 0.99 mg/dL (ref 0.61–1.24)
GFR, Estimated: >60 mL/min (ref >60)
Glucose, Bld: 112 mg/dL — ABNORMAL HIGH (ref 70–99)
Potassium: 2.2 mmol/L — CL (ref 3.5–5.1)
Sodium: 144 mmol/L (ref 135–145)
Total Bilirubin: 0.9 mg/dL (ref 0.3–1.2)
Total Protein: 6 g/dL — ABNORMAL LOW (ref 6.5–8.1)

## 2023-02-22 LAB — AMMONIA: Ammonia: 15 umol/L (ref 9–35)

## 2023-02-22 LAB — URINALYSIS, ROUTINE W REFLEX MICROSCOPIC
Bilirubin Urine: NEGATIVE
Glucose, UA: >=500 mg/dL — AB
Ketones, ur: NEGATIVE mg/dL
Nitrite: NEGATIVE
Protein, ur: 100 mg/dL — AB
Specific Gravity, Urine: 1.015 (ref 1.005–1.030)
pH: 6 (ref 5.0–8.0)

## 2023-02-22 LAB — CBG MONITORING, ED
Glucose-Capillary: 61 mg/dL — ABNORMAL LOW (ref 70–99)
Glucose-Capillary: 118 mg/dL — ABNORMAL HIGH (ref 70–99)

## 2023-02-22 LAB — MAGNESIUM: Magnesium: 1.8 mg/dL (ref 1.7–2.4)

## 2023-02-22 LAB — GLUCOSE, CAPILLARY: Glucose-Capillary: 127 mg/dL — ABNORMAL HIGH (ref 70–99)

## 2023-02-22 LAB — TROPONIN I (HIGH SENSITIVITY)
Troponin I (High Sensitivity): 12 ng/L (ref <18)
Troponin I (High Sensitivity): 14 ng/L (ref <18)

## 2023-02-22 LAB — SARS CORONAVIRUS 2 BY RT PCR: SARS Coronavirus 2 by RT PCR: NEGATIVE

## 2023-02-22 LAB — LACTIC ACID, PLASMA
Lactic Acid, Venous: 1.5 mmol/L (ref 0.5–1.9)
Lactic Acid, Venous: 1.3 mmol/L (ref 0.5–1.9)

## 2023-02-22 MED ORDER — FLUCONAZOLE 150 MG PO TABS
150.0000 mg | ORAL_TABLET | Freq: Once | ORAL | Status: AC
  Administered 2023-02-22: 150 mg via ORAL
  Filled 2023-02-22: qty 1

## 2023-02-22 MED ORDER — CHLORHEXIDINE GLUCONATE CLOTH 2 % EX PADS
6.0000 | MEDICATED_PAD | Freq: Every day | CUTANEOUS | Status: DC
  Administered 2023-02-22 – 2023-02-26 (×5): 6 via TOPICAL

## 2023-02-22 MED ORDER — SODIUM CHLORIDE 0.9% FLUSH: 3.0000 mL | INTRAVENOUS | Status: DC | PRN

## 2023-02-22 MED ORDER — POTASSIUM CHLORIDE CRYS ER 20 MEQ PO TBCR
40.0000 meq | EXTENDED_RELEASE_TABLET | Freq: Once | ORAL | Status: AC
  Administered 2023-02-22: 40 meq via ORAL
  Filled 2023-02-22: qty 2

## 2023-02-22 MED ORDER — RISPERIDONE 0.5 MG PO TABS
0.5000 mg | ORAL_TABLET | Freq: Every day | ORAL | Status: DC
  Administered 2023-02-22 – 2023-02-25 (×4): 0.5 mg via ORAL
  Filled 2023-02-22 (×4): qty 1

## 2023-02-22 MED ORDER — BISACODYL 10 MG RE SUPP: 10.0000 mg | Freq: Every day | RECTAL | Status: DC | PRN

## 2023-02-22 MED ORDER — GABAPENTIN 100 MG PO CAPS
100.0000 mg | ORAL_CAPSULE | Freq: Two times a day (BID) | ORAL | Status: DC
  Administered 2023-02-22 – 2023-02-26 (×8): 100 mg via ORAL
  Filled 2023-02-22 (×8): qty 1

## 2023-02-22 MED ORDER — POTASSIUM CHLORIDE CRYS ER 20 MEQ PO TBCR: 20.0000 meq | EXTENDED_RELEASE_TABLET | Freq: Every day | ORAL | Status: DC

## 2023-02-22 MED ORDER — TRAZODONE HCL 50 MG PO TABS
50.0000 mg | ORAL_TABLET | Freq: Every evening | ORAL | Status: DC | PRN
  Administered 2023-02-22 – 2023-02-25 (×4): 50 mg via ORAL
  Filled 2023-02-22 (×4): qty 1

## 2023-02-22 MED ORDER — INSULIN ASPART 100 UNIT/ML IJ SOLN: 0.0000 [IU] | Freq: Every day | INTRAMUSCULAR | Status: DC

## 2023-02-22 MED ORDER — INSULIN ASPART 100 UNIT/ML IJ SOLN: 0.0000 [IU] | Freq: Three times a day (TID) | INTRAMUSCULAR | Status: DC

## 2023-02-22 MED ORDER — ROSUVASTATIN CALCIUM 20 MG PO TABS
20.0000 mg | ORAL_TABLET | Freq: Every day | ORAL | Status: DC
  Administered 2023-02-22 – 2023-02-25 (×4): 20 mg via ORAL
  Filled 2023-02-22 (×4): qty 1

## 2023-02-22 MED ORDER — ONDANSETRON HCL 4 MG/2ML IJ SOLN: 4.0000 mg | Freq: Four times a day (QID) | INTRAMUSCULAR | Status: DC | PRN

## 2023-02-22 MED ORDER — APIXABAN 5 MG PO TABS
5.0000 mg | ORAL_TABLET | Freq: Two times a day (BID) | ORAL | Status: DC
  Administered 2023-02-22 – 2023-02-26 (×8): 5 mg via ORAL
  Filled 2023-02-22 (×8): qty 1

## 2023-02-22 MED ORDER — ONDANSETRON HCL 4 MG PO TABS: 4.0000 mg | ORAL_TABLET | Freq: Four times a day (QID) | ORAL | Status: DC | PRN

## 2023-02-22 MED ORDER — POTASSIUM CHLORIDE CRYS ER 20 MEQ PO TBCR
40.0000 meq | EXTENDED_RELEASE_TABLET | ORAL | Status: AC
  Administered 2023-02-22 (×2): 40 meq via ORAL
  Filled 2023-02-22 (×2): qty 2

## 2023-02-22 MED ORDER — INSULIN GLARGINE-YFGN 100 UNIT/ML ~~LOC~~ SOLN
15.0000 [IU] | Freq: Every day | SUBCUTANEOUS | Status: DC
  Administered 2023-02-22: 15 [IU] via SUBCUTANEOUS
  Filled 2023-02-22 (×2): qty 0.15

## 2023-02-22 MED ORDER — SODIUM CHLORIDE 0.9% FLUSH
3.0000 mL | Freq: Two times a day (BID) | INTRAVENOUS | Status: DC
  Administered 2023-02-23 – 2023-02-26 (×4): 3 mL via INTRAVENOUS

## 2023-02-22 MED ORDER — SODIUM CHLORIDE 0.9% FLUSH
3.0000 mL | Freq: Two times a day (BID) | INTRAVENOUS | Status: DC
  Administered 2023-02-22 – 2023-02-26 (×7): 3 mL via INTRAVENOUS

## 2023-02-22 MED ORDER — SENNOSIDES-DOCUSATE SODIUM 8.6-50 MG PO TABS
2.0000 | ORAL_TABLET | Freq: Every day | ORAL | Status: DC
  Administered 2023-02-23 – 2023-02-25 (×3): 2 via ORAL
  Filled 2023-02-22 (×3): qty 2

## 2023-02-22 MED ORDER — TAMSULOSIN HCL 0.4 MG PO CAPS
0.4000 mg | ORAL_CAPSULE | Freq: Every day | ORAL | Status: DC
  Administered 2023-02-22 – 2023-02-25 (×4): 0.4 mg via ORAL
  Filled 2023-02-22 (×4): qty 1

## 2023-02-22 MED ORDER — POTASSIUM CHLORIDE 10 MEQ/100ML IV SOLN
10.0000 meq | INTRAVENOUS | Status: AC
  Administered 2023-02-22 (×4): 10 meq via INTRAVENOUS
  Filled 2023-02-22 (×4): qty 100

## 2023-02-22 MED ORDER — FLUCONAZOLE 100 MG PO TABS: 200.0000 mg | ORAL_TABLET | Freq: Every day | ORAL | Status: DC

## 2023-02-22 MED ORDER — METOPROLOL TARTRATE 25 MG PO TABS
12.5000 mg | ORAL_TABLET | Freq: Two times a day (BID) | ORAL | Status: DC
  Administered 2023-02-22 – 2023-02-26 (×8): 12.5 mg via ORAL
  Filled 2023-02-22 (×8): qty 1

## 2023-02-22 MED ORDER — INSULIN ASPART 100 UNIT/ML IJ SOLN: 3.0000 [IU] | Freq: Three times a day (TID) | INTRAMUSCULAR | Status: DC

## 2023-02-22 MED ORDER — SODIUM CHLORIDE 0.9 % IV SOLN: INTRAVENOUS | Status: DC | PRN

## 2023-02-22 MED ORDER — AMIODARONE HCL 200 MG PO TABS
200.0000 mg | ORAL_TABLET | Freq: Every day | ORAL | Status: DC
  Administered 2023-02-23 – 2023-02-26 (×4): 200 mg via ORAL
  Filled 2023-02-22 (×4): qty 1

## 2023-02-22 MED ORDER — PANTOPRAZOLE SODIUM 40 MG PO TBEC: 40.0000 mg | DELAYED_RELEASE_TABLET | Freq: Every day | ORAL | Status: DC

## 2023-02-22 MED ORDER — FUROSEMIDE 40 MG PO TABS: 20.0000 mg | ORAL_TABLET | Freq: Every day | ORAL | Status: DC

## 2023-02-22 MED ORDER — PANTOPRAZOLE SODIUM 40 MG PO TBEC
40.0000 mg | DELAYED_RELEASE_TABLET | Freq: Every day | ORAL | Status: DC
  Administered 2023-02-22 – 2023-02-25 (×4): 40 mg via ORAL
  Filled 2023-02-22 (×4): qty 1

## 2023-02-22 MED ORDER — TAMSULOSIN HCL 0.4 MG PO CAPS: 0.4000 mg | ORAL_CAPSULE | Freq: Every day | ORAL | Status: DC

## 2023-02-22 MED ORDER — ESCITALOPRAM OXALATE 10 MG PO TABS
10.0000 mg | ORAL_TABLET | Freq: Every day | ORAL | Status: DC
  Administered 2023-02-23 – 2023-02-26 (×4): 10 mg via ORAL
  Filled 2023-02-22 (×4): qty 1

## 2023-02-22 MED ORDER — FERROUS SULFATE 325 (65 FE) MG PO TABS
325.0000 mg | ORAL_TABLET | Freq: Every day | ORAL | Status: DC
  Administered 2023-02-23 – 2023-02-26 (×4): 325 mg via ORAL
  Filled 2023-02-22 (×4): qty 1

## 2023-02-22 MED ORDER — CEFADROXIL 500 MG PO CAPS
500.0000 mg | ORAL_CAPSULE | Freq: Two times a day (BID) | ORAL | Status: DC
  Administered 2023-02-22: 500 mg via ORAL
  Filled 2023-02-22 (×4): qty 1

## 2023-02-22 MED ORDER — ACETAMINOPHEN 650 MG RE SUPP: 650.0000 mg | Freq: Four times a day (QID) | RECTAL | Status: DC | PRN

## 2023-02-22 MED ORDER — ACETAMINOPHEN 325 MG PO TABS
650.0000 mg | ORAL_TABLET | Freq: Four times a day (QID) | ORAL | Status: DC | PRN
  Administered 2023-02-23: 650 mg via ORAL
  Filled 2023-02-22 (×3): qty 2

## 2023-02-22 MED ORDER — POLYETHYLENE GLYCOL 3350 17 G PO PACK: 17.0000 g | PACK | Freq: Every day | ORAL | Status: DC | PRN

## 2023-02-22 NOTE — ED Notes (Signed)
Patient given two cups of OJ. Nurse notified.

## 2023-02-22 NOTE — ED Triage Notes (Signed)
Patient arrives to ED via [facility] , report patient has history of multiple falls. Facility reports patient lost balance and assisted to floor. Facility reported to EMS pt. has altered mental status changes.  CBG within normal limits.

## 2023-02-22 NOTE — ED Notes (Signed)
Patient

## 2023-02-22 NOTE — ED Provider Notes (Signed)
[place] EMERGENCY DEPARTMENT AT [facility] Provider Note   CSN: [id number] Arrival date & time: 02/22/23  2952     History  No chief complaint on file.   Jim Ramirez is a 72 y.o. male.  Patient is currently at rehab after recent complicated course at [facility].  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  He also had a DVT and new onset A-fib, got an IVC filter and is on anticoagulation.  Currently receiving IV antibiotics via PICC line.  Is at rehab to work on strengthening.  He was sent in the facility today for altered mental status.  Reportedly patient has been hallucinating.  Patient himself denies any complaints is awake and alert.  He tells me shortness of breath is better and he has been eating and drinking well.  Still not walking but working on that rehab.  Chronic back pain.  The history is provided by the patient and the EMS personnel.  Altered Mental Status Presenting symptoms comment:  Hallucinations Context: nursing home resident, recent change in medication, recent illness and recent infection   Associated symptoms: hallucinations   Associated symptoms: no abdominal pain, no difficulty breathing, no fever, no headaches, no nausea and no vomiting        Home Medications Prior to Admission medications   Medication Sig Start Date End Date Taking? Authorizing Provider  amiodarone (PACERONE) 200 MG tablet Take 1 tablet (200 mg total) by mouth daily. 01/28/23   Burnadette Pop, MD  apixaban (ELIQUIS) 5 MG TABS tablet Take 1 tablet (5 mg total) by mouth 2 (two) times daily. 01/27/23   Burnadette Pop, MD  azelastine (ASTELIN) 0.1 % nasal spray Place 1 spray into both nostrils 2 (two) times daily. Use in each nostril as directed    [provider]  cefadroxil (DURICEF) 500 MG capsule Take 2 capsules (1,000 mg total) by mouth 2 (two) times daily. Continue for long term Patient not taking:  Reported on 02/04/2023 02/19/23   Burnadette Pop, MD  ceFAZolin (ANCEF) IVPB Inject 2 g into the vein every 8 (eight) hours. Indication:  Disseminated MSSA infection First Dose: Yes Last Day of Therapy:  02/18/23 Labs - Once weekly:  CBC/D and BMP, Labs - Once weekly: ESR and CRP Method of administration: IV Push Start on 01/11/23 after completion of Nafcillin CI, pull PICC line after completion of IV antibiotic therapy Method of administration may be changed at the discretion of home infusion pharmacist based upon assessment of the patient and/or caregiver's ability to self-administer the medication ordered. 01/27/23 02/25/23  Burnadette Pop, MD  cyanocobalamin (VITAMIN B12) 1000 MCG/ML injection Inject 1,000 mcg into the muscle every 30 (thirty) days. 03/12/22   [provider]  dapagliflozin propanediol (FARXIGA) 10 MG TABS tablet Take 10 mg by mouth daily.    [provider]  diclofenac Sodium (VOLTAREN) 1 % GEL Apply 2 g topically 4 (four) times daily. 01/27/23   Burnadette Pop, MD  docusate sodium (COLACE) 100 MG capsule Take 1 capsule (100 mg total) by mouth 2 (two) times daily. 05/23/22   Val Eagle D, NP  escitalopram (LEXAPRO) 10 MG tablet Take 10 mg by mouth daily.    [provider]  esomeprazole (NEXIUM) 40 MG capsule Take 40 mg by mouth 2 (two) times daily.    [provider]  ferrous sulfate 325 (65 FE) MG tablet Take 1 tablet (325 mg total) by mouth daily  with breakfast. 01/28/23   Burnadette Pop, MD  furosemide (LASIX) 20 MG tablet Take 1 tablet (20 mg total) by mouth daily. 01/28/23   Burnadette Pop, MD  gabapentin (NEURONTIN) 100 MG capsule Take 1 capsule (100 mg total) by mouth 2 (two) times daily. 01/27/23   Burnadette Pop, MD  insulin aspart (NOVOLOG) 100 UNIT/ML injection Inject 3 Units into the skin 3 (three) times daily with meals. If eats more than 50% of meals 01/27/23   Burnadette Pop, MD  insulin glargine-yfgn (SEMGLEE) 100 UNIT/ML  injection Inject 0.15 mLs (15 Units total) into the skin at bedtime. 01/27/23   Burnadette Pop, MD  levocetirizine (XYZAL) 5 MG tablet Take 5 mg by mouth every evening.    [provider]  lidocaine (LIDODERM) 5 % Place 2 patches onto the skin daily. Remove & Discard patch within 12 hours or as directed by MD 01/28/23   Burnadette Pop, MD  metFORMIN (GLUCOPHAGE) 1000 MG tablet Take 1,000 mg by mouth 2 (two) times daily with a meal. Patient not taking: Reported on 02/04/2023    [provider]  methocarbamol (ROBAXIN) 500 MG tablet Take 1 tablet (500 mg total) by mouth every 8 (eight) hours as needed for muscle spasms. Patient not taking: Reported on 02/04/2023 01/27/23   Burnadette Pop, MD  metoprolol tartrate (LOPRESSOR) 25 MG tablet Take 0.5 tablets (12.5 mg total) by mouth 2 (two) times daily. 01/27/23   Burnadette Pop, MD  Nystatin (GERHARDT'S BUTT CREAM) CREA Apply 1 Application topically daily as needed for irritation. Patient not taking: Reported on 02/04/2023 01/27/23   Burnadette Pop, MD  omeprazole (PRILOSEC) 20 MG capsule Take 20 mg by mouth daily.    [provider]  oxyCODONE (OXY IR/ROXICODONE) 5 MG immediate release tablet Take 1 tablet (5 mg total) by mouth every 3 (three) hours as needed for moderate pain ((score 4 to 6)). 01/27/23   Burnadette Pop, MD  potassium chloride SA (KLOR-CON M) 20 MEQ tablet Take 1 tablet (20 mEq total) by mouth daily. 01/28/23   Burnadette Pop, MD  risperiDONE (RISPERDAL) 0.5 MG tablet Take 1 tablet (0.5 mg total) by mouth at bedtime. 01/27/23   Burnadette Pop, MD  rosuvastatin (CRESTOR) 20 MG tablet Take 20 mg by mouth daily. 04/07/22   [provider]  tamsulosin (FLOMAX) 0.4 MG CAPS capsule Take 0.4 mg by mouth daily.    [provider]      Allergies    Patient has no known allergies.    Review of Systems   Review of Systems  Constitutional:  Negative for fever.  Respiratory:  Negative for cough and shortness of  breath.   Cardiovascular:  Negative for chest pain.  Gastrointestinal:  Negative for abdominal pain, nausea and vomiting.  Musculoskeletal:  Positive for back pain.  Skin:  Positive for wound.  Neurological:  Negative for headaches.  Psychiatric/Behavioral:  Positive for hallucinations.     Physical Exam Updated Vital Signs BP 115/63 (BP Location: Right Arm)   Pulse 84   Temp 98.8 F (37.1 C) (Oral)   Resp 13   Ht 6' (1.829 m)   Wt 88.5 kg   SpO2 94%   BMI 26.45 kg/m  Physical Exam Vitals and nursing note reviewed.  Constitutional:      General: He is not in acute distress.    Appearance: Normal appearance. He is well-developed.  HENT:     Head: Normocephalic and atraumatic.  Eyes:     Conjunctiva/sclera: Conjunctivae normal.  Cardiovascular:     Rate and Rhythm: Normal rate and regular rhythm.     Heart sounds: No murmur heard. Pulmonary:     Effort: Pulmonary effort is normal. No respiratory distress.     Breath sounds: Normal breath sounds.  Abdominal:     Palpations: Abdomen is soft.     Tenderness: There is no abdominal tenderness. There is no guarding or rebound.  Musculoskeletal:        General: No deformity. Normal range of motion.     Cervical back: Neck supple.     Left lower leg: Edema present.     Comments: He has some edema in his left leg.  There is no cords appreciated.  There is no overlying erythema.  Skin:    General: Skin is warm and dry.     Capillary Refill: Capillary refill takes less than 2 seconds.       Neurological:     General: No focal deficit present.     Mental Status: He is alert and oriented to person, place, and time.     Motor: No weakness.     Comments: He is awake alert oriented.  He has good use of his upper extremities.  He is able to lift his right upper extremity off the bed and he can minimally lift his left upper extremity off the bed.     ED Results / Procedures / Treatments   Labs (all labs ordered are listed, but  only abnormal results are displayed) Labs Reviewed  COMPREHENSIVE METABOLIC PANEL - Abnormal; Notable for the following components:      Result Value   Potassium 2.2 (*)    Glucose, Bld 112 (*)    Calcium 7.5 (*)    Total Protein 6.0 (*)    Albumin 1.8 (*)    All other components within normal limits  CBC WITH DIFFERENTIAL/PLATELET - Abnormal; Notable for the following components:   RBC 2.97 (*)    Hemoglobin 8.6 (*)    HCT 28.5 (*)    RDW 21.0 (*)    Platelets 125 (*)    All other components within normal limits  URINALYSIS, ROUTINE W REFLEX MICROSCOPIC - Abnormal; Notable for the following components:   APPearance HAZY (*)    Glucose, UA >=500 (*)    Hgb urine dipstick MODERATE (*)    Protein, ur 100 (*)    Leukocytes,Ua TRACE (*)    Bacteria, UA RARE (*)    All other components within normal limits  RENAL FUNCTION PANEL - Abnormal; Notable for the following components:   Sodium 147 (*)    Potassium 2.4 (*)    Calcium 7.5 (*)    Albumin 1.7 (*)    All other components within normal limits  SARS CORONAVIRUS 2 BY RT PCR  CULTURE, BLOOD (ROUTINE X 2)  CULTURE, BLOOD (ROUTINE X 2)  URINE CULTURE  LACTIC ACID, PLASMA  LACTIC ACID, PLASMA  AMMONIA  MAGNESIUM  BASIC METABOLIC PANEL  CBC  TROPONIN I (HIGH SENSITIVITY)  TROPONIN I (HIGH SENSITIVITY)    EKG EKG Interpretation Date/Time:  Saturday February 22 2023 09:81:19 EDT Ventricular Rate:  84 PR Interval:    QRS Duration:  162 QT Interval:  493 QTC Calculation: 583 R Axis:   7  Text Interpretation: Atrial flutter with predominant 4:1 AV block Right bundle branch block rate slower than prior 5/24 Confirmed by Meridee Score [phone]) on 02/22/2023 10:10:46 AM  Radiology CT Head Wo Contrast  Result Date:  02/22/2023 CLINICAL DATA:  Provided history: Mental status change, unknown cause. Multiple falls. EXAM: CT HEAD WITHOUT CONTRAST TECHNIQUE: Contiguous axial images were obtained from the base of the skull through the  vertex without intravenous contrast. RADIATION DOSE REDUCTION: This exam was performed according to the departmental dose-optimization program which includes automated exposure control, adjustment of the mA and/or kV according to patient size and/or use of iterative reconstruction technique. COMPARISON:  Brain MRI 12/28/2022.  Head CT 12/23/2022. FINDINGS: Brain: Mild generalized parenchymal atrophy. There is no acute intracranial hemorrhage. No demarcated cortical infarct. No extra-axial fluid collection. No evidence of an intracranial mass. No midline shift. Vascular: No hyperdense vessel.  Atherosclerotic calcifications. Skull: No calvarial fracture or aggressive osseous lesion. Sinuses/Orbits: No mass or acute finding within the imaged orbits. Postsurgical appearance of the paranasal sinuses. Minimal mucosal thickening within the bilateral maxillary sinuses. IMPRESSION: 1. No evidence of an acute intracranial abnormality. 2. Mild generalized parenchymal atrophy. 3. Minimal mucosal thickening within the bilateral maxillary sinuses. Electronically Signed   By: Jackey Loge D.O.   On: 02/22/2023 10:56   DG Chest Port 1 View  Result Date: 02/22/2023 CLINICAL DATA:  Altered mental status. EXAM: PORTABLE CHEST 1 VIEW COMPARISON:  12/26/2022 FINDINGS: The heart size and mediastinal contours are within normal limits. Stable elevation of left hemidiaphragm. Both lungs are clear. IMPRESSION: No active disease. Electronically Signed   By: Danae Orleans M.D.   On: 02/22/2023 10:42    Procedures .Critical Care  Performed by: Terrilee Files, MD Authorized by: Terrilee Files, MD   Critical care provider statement:    Critical care time (minutes):  45   Critical care time was exclusive of:  Separately billable procedures and treating other patients   Critical care was necessary to treat or prevent imminent or life-threatening deterioration of the following conditions:  CNS failure or compromise and metabolic  crisis   Critical care was time spent personally by me on the following activities:  Development of treatment plan with patient or surrogate, discussions with consultants, evaluation of patient's response to treatment, examination of patient, obtaining history from patient or surrogate, ordering and performing treatments and interventions, ordering and review of laboratory studies, ordering and review of radiographic studies, pulse oximetry, re-evaluation of patient's condition and review of old charts   I assumed direction of critical care for this patient from another provider in my specialty: no       Medications Ordered in ED Medications  potassium chloride 10 mEq in 100 mL IVPB (10 mEq Intravenous New Bag/Given 02/22/23 1643)  amiodarone (PACERONE) tablet 200 mg (has no administration in time range)  furosemide (LASIX) tablet 20 mg (has no administration in time range)  metoprolol tartrate (LOPRESSOR) tablet 12.5 mg (has no administration in time range)  rosuvastatin (CRESTOR) tablet 20 mg (has no administration in time range)  escitalopram (LEXAPRO) tablet 10 mg (has no administration in time range)  risperiDONE (RISPERDAL) tablet 0.5 mg (has no administration in time range)  insulin glargine-yfgn (SEMGLEE) injection 15 Units (has no administration in time range)  senna-docusate (Senokot-S) tablet 2 tablet (has no administration in time range)  pantoprazole (PROTONIX) EC tablet 40 mg (has no administration in time range)  tamsulosin (FLOMAX) capsule 0.4 mg (has no administration in time range)  apixaban (ELIQUIS) tablet 5 mg (has no administration in time range)  ferrous sulfate tablet 325 mg (has no administration in time range)  gabapentin (NEURONTIN) capsule 100 mg (has no administration in time range)  potassium chloride SA (KLOR-CON M) CR tablet 20 mEq (has no administration in time range)  sodium chloride flush (NS) 0.9 % injection 3 mL (has no administration in time range)  sodium  chloride flush (NS) 0.9 % injection 3 mL (has no administration in time range)  sodium chloride flush (NS) 0.9 % injection 3 mL (has no administration in time range)  0.9 %  sodium chloride infusion (has no administration in time range)  acetaminophen (TYLENOL) tablet 650 mg (has no administration in time range)    Or  acetaminophen (TYLENOL) suppository 650 mg (has no administration in time range)  traZODone (DESYREL) tablet 50 mg (has no administration in time range)  polyethylene glycol (MIRALAX / GLYCOLAX) packet 17 g (has no administration in time range)  bisacodyl (DULCOLAX) suppository 10 mg (has no administration in time range)  ondansetron (ZOFRAN) tablet 4 mg (has no administration in time range)    Or  ondansetron (ZOFRAN) injection 4 mg (has no administration in time range)  insulin aspart (novoLOG) injection 0-15 Units (has no administration in time range)  insulin aspart (novoLOG) injection 0-5 Units (has no administration in time range)  insulin aspart (novoLOG) injection 3 Units (has no administration in time range)  potassium chloride SA (KLOR-CON M) CR tablet 40 mEq (has no administration in time range)  cefadroxil (DURICEF) capsule 500 mg (has no administration in time range)  potassium chloride SA (KLOR-CON M) CR tablet 40 mEq (40 mEq Oral Given 02/22/23 1300)    ED Course/ Medical Decision Making/ A&P Clinical Course as of 02/22/23 1727  Sat Feb 22, 2023  1043 Chest x-ray interpreted by me as no clear infiltrate.  Awaiting radiology reading. [MB]  1256 Discussed with Dr. Mariea Clonts [facility] hospitalist who will evaluate patient for admission. [MB]    Clinical Course User Index [MB] Terrilee Files, MD                             Medical Decision Making Amount and/or Complexity of Data Reviewed Labs: ordered. Radiology: ordered.  Risk Prescription drug management. Decision regarding hospitalization.   This patient complains of back pain altered mental status  general weakness falls; this involves an extensive number of treatment Options and is a complaint that carries with it a high risk of complications and morbidity. The differential includes metabolic derangement, sepsis, Sirs, neurologic injury, dehydration  I ordered, reviewed and interpreted labs, which included CBC with normal white count low hemoglobin low platelets stable from priors, chemistries with critically low potassium normal renal function, urinalysis equivocal for infection, lactate normal, COVID-negative, blood culture sent I ordered medication IV fluids oral potassium and reviewed PMP when indicated. I ordered imaging studies which included chest x-ray, CT head and I independently    visualized and interpreted imaging which showed no acute findings Additional history obtained from EMS Previous records obtained and reviewed in epic including recent discharge summary I consulted [facility] hospitalist Dr. Mariea Clonts and discussed lab and imaging findings and discussed disposition.  Cardiac monitoring reviewed, sinus rhythm Social determinants considered, no significant barriers Critical Interventions: Workup for possible sepsis and altered mental status.  Repletion of electrolytes and discussion with hospitalist regarding admission.  After the interventions stated above, I reevaluated the patient and found patient's vital signs to have been stable and in no distress Admission and further testing considered, patient will benefit from mission to the hospital for continued potassium repletion along with ongoing workup of his  symptoms.         Final Clinical Impression(s) / ED Diagnoses Final diagnoses:  General weakness  Hypokalemia  Frequent falls  Hallucinations    Rx / DC Orders ED Discharge Orders     None         Terrilee Files, MD 02/22/23 1731

## 2023-02-22 NOTE — ED Notes (Signed)
Family given updates of current medical condition. DNR bracelet placed on patient

## 2023-02-22 NOTE — H&P (Signed)
Patient Demographics:    Jim Ramirez, is a 72 y.o. male  MRN: [id number]   DOB - [birth year]  Admit Date - 02/22/2023  Outpatient Primary MD for the patient is [name], MD   Assessment & Plan:   Assessment and Plan:  1)Acute Metabolic Encephalopathy---??  Etiology -CT without acute findings -No leukocytosis -Chest x-ray without acute findings -Ammonia is not elevated -?? 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 on 01/23/2023 --- continue Eliquis  5)PAfib---EKG A-fib RBB heart rate in the 80s -TTE and TEE from June 2024 with EF of 65% without aortic stenosis -Continue amiodarone and metoprolol for rate control -Eliquis for stroke prophylaxis  6) severe hypokalemia--- potassium 2.2 -Magnesium WNL -Phosphorous WNL -Hold Lasix -Replace potassium  7) generalized weakness/ambulatory dysfunction/recurrent falls in the setting of recent back surgery =--Patient was getting PT at [facility] get PT evaluated  8)Decubitus Ulcer/PI--POA -Wound care consult requested  9)DM2-recent A1c 8.5 Use Novolog/Humalog Sliding scale insulin with  Accu-Cheks/Fingersticks as ordered  -Continue Semglee 15 units nightly  10)GERD--continue Protonix  11)HLD--continue Crestor  Dispo: The patient is from: SNF              Anticipated d/c is to: SNF              Anticipated d/c date is: 2 days              Patient currently is not medically stable to d/c. Barriers: Not Clinically Stable-    With History of - Reviewed by me  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   CTA negative for aneurysm   Depressed    Diabetes mellitus without complication (HCC)    DVT (deep venous thrombosis) (HCC)    History of kidney stones    Hypertension    Nephrolithiasis    Sleep apnea       Past Surgical History:  Procedure Laterality Date   ANTERIOR CERVICAL DECOMP/DISCECTOMY FUSION N/A 05/22/2022   Procedure: ANTERIOR CERVICAL DISCECTOMY FUSION, INTERBODY PROSTHESIS,PLATE/SCREWS CERVICAL THREE-FOUR, CERVICAL FOUR-FIVE;REMOVAL CERVICAL PLATE;  Surgeon: Tressie Stalker, MD;  Location: [facility] OR;  Service: Neurosurgery;  Laterality: N/A;  3C   CATARACT EXTRACTION W/PHACO Left 09/23/2022   Procedure: CATARACT EXTRACTION PHACO AND INTRAOCULAR LENS PLACEMENT (IOC);  Surgeon: Fabio Pierce, MD;  Location: [facility];  Service: Ophthalmology;  Laterality: Left;  CDE: 7.52   CATARACT EXTRACTION W/PHACO Right 10/07/2022   Procedure: CATARACT EXTRACTION PHACO AND INTRAOCULAR LENS PLACEMENT (IOC);  Surgeon: Fabio Pierce, MD;  Location: [facility];  Service: Ophthalmology;  Laterality: Right;  CDE: 9.40   IR IVC FILTER PLMT / S&I /IMG GUID/MOD SED  01/19/2023   LUMBAR LAMINECTOMY/DECOMPRESSION MICRODISCECTOMY N/A 01/20/2023   Procedure: LUMBAR LAM FOR EPIDURAL ABSCESS, LUMBAR THREE-FOUR, LUMBAR FOUR-FIVE,  LUMBAR FIVE-SACRAL ONE;  Surgeon: Tressie Stalker, MD;  Location: [facility] OR;  Service: Neurosurgery;  Laterality: N/A;   STOMACH SURGERY     TEE WITHOUT CARDIOVERSION N/A 12/31/2022   Procedure:  TRANSESOPHAGEAL ECHOCARDIOGRAM;  Surgeon: Little Ishikawa, MD;  Location: [facility] INVASIVE CV LAB;  Service: Cardiovascular;  Laterality: N/A;   TONSILLECTOMY  2000      Chief Complaint  Patient presents with   Altered Mental Status      HPI:    Jim Ramirez  is a 72 y.o. 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 Lovell Sheehan on 01/20/2023 , as well as a diagnosis of left lower extremity DVT status post IVC filter placement on 01/23/2023 presenting from [facility] rehab with recurrent falls and generalized weakness and altered mentation with psychosis/hallucinations -He completed IV Ancef on 02/18/2023 and was transitioned to p.o.cefadroxil 500 mg bid No fever  Or chills   No Nausea, Vomiting or Diarrhea  -No chest pains or palpitations -Denies head injury -Endorses back pain -Potassium is 2.2 magnesium WNL -Sodium 144, creatinine 0.99 -Troponin and lactic acid are not elevated, EKG A-fib RBB heart rate in the 80s -WBC 10.5 hemoglobin 8.6, platelets 125 -UA suggestive of UTI and yeast -Ammonia is 15 -In the ED CT head and chest x-ray without acute findings   Review of systems:    In addition to the HPI above,   A full Review of  Systems was done, all other systems reviewed are negative except as noted above in HPI , .    Social History:  Reviewed by me    Social History   Tobacco Use   Smoking status: Former    Current packs/day: 0.00    Types: Cigarettes    Quit date: 1994    Years since quitting: 30.5    Passive exposure: Never   Smokeless tobacco: Never  Substance Use Topics   Alcohol use: Not Currently        Family History :  Reviewed by me    Family History  Problem Relation Age of Onset   Heart attack Mother    Heart attack Father    Hypertension Brother    Diabetes Brother    Coronary artery disease Brother     Home Medications:   Prior to Admission medications   Medication Sig Start Date End Date Taking? Authorizing Provider  amiodarone (PACERONE) 200 MG tablet Take 1 tablet (200 mg total) by mouth daily. 01/28/23  Yes Burnadette Pop, MD  apixaban (ELIQUIS) 5 MG TABS tablet Take 1 tablet (5 mg total) by mouth 2 (two) times daily. 01/27/23  Yes Burnadette Pop, MD  azelastine (ASTELIN) 0.1 % nasal spray Place 1 spray into both nostrils 2 (two) times daily. Use in  each nostril as directed   Yes [provider]  cefadroxil (DURICEF) 500 MG capsule Take 2 capsules (1,000 mg total) by mouth 2 (two) times daily. Continue for long term Patient taking differently: Take 500 mg by mouth 2 (two) times daily. Continue for long term 02/19/23  Yes Adhikari, Willia Craze, MD  ceFAZolin (ANCEF) IVPB Inject 2 g into the vein every 8 (eight) hours. Indication:  Disseminated MSSA infection First Dose: Yes Last Day of Therapy:  02/18/23 Labs - Once weekly:  CBC/D and BMP, Labs - Once weekly: ESR and CRP Method of administration: IV Push Start on 01/11/23 after completion of Nafcillin CI, pull PICC line after completion of IV antibiotic therapy Method of administration may be changed at the discretion of home infusion pharmacist based upon assessment of the patient and/or caregiver's ability to self-administer the medication ordered. 01/27/23 02/25/23 Yes Burnadette Pop, MD  cyanocobalamin (VITAMIN B12) 1000 MCG/ML injection Inject 1,000 mcg into the muscle every 30 (thirty) days. 03/12/22  Yes [provider]  dapagliflozin propanediol (FARXIGA) 10 MG TABS tablet Take 10 mg by mouth daily.   Yes [provider]  docusate sodium (COLACE) 100 MG capsule Take 1 capsule (100 mg total) by mouth 2  (two) times daily. 05/23/22  Yes Val Eagle D, NP  escitalopram (LEXAPRO) 10 MG tablet Take 10 mg by mouth daily.   Yes [provider]  esomeprazole (NEXIUM) 40 MG capsule Take 40 mg by mouth daily.   Yes [provider]  ferrous sulfate 325 (65 FE) MG tablet Take 1 tablet (325 mg total) by mouth daily with breakfast. 01/28/23  Yes Adhikari, Willia Craze, MD  gabapentin (NEURONTIN) 100 MG capsule Take 1 capsule (100 mg total) by mouth 2 (two) times daily. 01/27/23  Yes Adhikari, Willia Craze, MD  insulin aspart (NOVOLOG) 100 UNIT/ML injection Inject 3 Units into the skin 3 (three) times daily with meals. If eats more than 50% of meals 01/27/23  Yes Adhikari, Willia Craze, MD  insulin glargine-yfgn (SEMGLEE) 100 UNIT/ML injection Inject 0.15 mLs (15 Units total) into the skin at bedtime. 01/27/23  Yes Burnadette Pop, MD  levocetirizine (XYZAL) 5 MG tablet Take 5 mg by mouth every evening.   Yes [provider]  metoprolol tartrate (LOPRESSOR) 25 MG tablet Take 0.5 tablets (12.5 mg total) by mouth 2 (two) times daily. 01/27/23  Yes Burnadette Pop, MD  nystatin (MYCOSTATIN/NYSTOP) powder Apply 1 Application topically daily. For 10 days 02/11/23  Yes [provider]  omeprazole (PRILOSEC) 20 MG capsule Take 20 mg by mouth daily.   Yes [provider]  oxyCODONE (OXY IR/ROXICODONE) 5 MG immediate release tablet Take 1 tablet (5 mg total) by mouth every 3 (three) hours as needed for moderate pain ((score 4 to 6)). 01/27/23  Yes Adhikari, Willia Craze, MD  potassium chloride SA (KLOR-CON M) 20 MEQ tablet Take 1 tablet (20 mEq total) by mouth daily. 01/28/23  Yes Burnadette Pop, MD  risperiDONE (RISPERDAL) 0.5 MG tablet Take 1 tablet (0.5 mg total) by mouth at bedtime. 01/27/23  Yes Burnadette Pop, MD  rosuvastatin (CRESTOR) 20 MG tablet Take 20 mg by mouth daily. 04/07/22  Yes [provider]  tamsulosin (FLOMAX) 0.4 MG CAPS capsule Take 0.4 mg by mouth daily.   Yes [provider]   torsemide (DEMADEX) 20 MG tablet Take 20 mg by mouth daily.   Yes [provider]  diclofenac Sodium (VOLTAREN) 1 % GEL Apply 2 g topically 4 (four) times daily. 01/27/23   Adhikari,  Amrit, MD  furosemide (LASIX) 20 MG tablet Take 1 tablet (20 mg total) by mouth daily. 01/28/23   Burnadette Pop, MD     Allergies:    No Known Allergies   Physical Exam:   Vitals  Blood pressure 119/63, pulse 80, temperature 98.7 F (37.1 C), temperature source Oral, resp. rate 17, height 6' (1.829 m), weight 88.5 kg, SpO2 95%.  Physical Examination: General appearance - alert,  in no distress  Mental status - alert, oriented to person, place, and time, report of confusion episodes with disorientation and psychosis/hallucinations Eyes - sclera anicteric Neck - supple, no JVD elevation , Chest - clear  to auscultation bilaterally, symmetrical air movement,  Heart - S1 and S2 normal, irregular  Abdomen - soft, nontender, nondistended, +BS Neurological - screening mental status exam normal, neck supple without rigidity, cranial nerves II through XII intact, DTR's normal and symmetric Extremities - no pedal edema noted, intact peripheral pulses  Skin - warm, dry MSK/Back----  Media Information  Document Information  Photos    02/22/2023 09:39  Attached To:  Hospital Encounter on 02/22/23  Source Information  Terrilee Files, MD  [facility]  Document History      Data Review:    CBC Recent Labs  Lab 02/22/23 1038  WBC 10.5  HGB 8.6*  HCT 28.5*  PLT 125*  MCV 96.0  MCH 29.0  MCHC 30.2  RDW 21.0*  LYMPHSABS 2.0  MONOABS 0.7  EOSABS 0.0  BASOSABS 0.0   ------------------------------------------------------------------------------------------------------------------  Chemistries  Recent Labs  Lab 02/22/23 1038 02/22/23 1237 02/22/23 1548  NA 144  --  147*  K 2.2*  --  2.4*  CL 104  --  108  CO2 30  --  29  GLUCOSE 112*  --  85  BUN 14  --  12  CREATININE  0.99  --  0.93  CALCIUM 7.5*  --  7.5*  MG  --  1.8  --   AST 28  --   --   ALT <5  --   --   ALKPHOS 93  --   --   BILITOT 0.9  --   --    ------------------------------------------------------------------------------------------------------------------ estimated creatinine clearance is 81.1 mL/min (by C-G formula based on SCr of 0.93 mg/dL). ------------------------------------------------------------------------------------------------------------------ ------------------------------------------------------------------------------------------------------------------    Component Value Date/Time   BNP 27.0 01/23/2023 0515   Urinalysis    Component Value Date/Time   COLORURINE YELLOW 02/22/2023 1200   APPEARANCEUR HAZY (A) 02/22/2023 1200   LABSPEC 1.015 02/22/2023 1200   PHURINE 6.0 02/22/2023 1200   GLUCOSEU >=500 (A) 02/22/2023 1200   HGBUR MODERATE (A) 02/22/2023 1200   BILIRUBINUR NEGATIVE 02/22/2023 1200   KETONESUR NEGATIVE 02/22/2023 1200   PROTEINUR 100 (A) 02/22/2023 1200   UROBILINOGEN 2.0 (H) 05/09/2010 0945   NITRITE NEGATIVE 02/22/2023 1200   LEUKOCYTESUR TRACE (A) 02/22/2023 1200   ---------------------------------------------------------------------------------------------------------------   Imaging Results:    CT Head Wo Contrast  Result Date: 02/22/2023 CLINICAL DATA:  Provided history: Mental status change, unknown cause. Multiple falls. EXAM: CT HEAD WITHOUT CONTRAST TECHNIQUE: Contiguous axial images were obtained from the base of the skull through the vertex without intravenous contrast. RADIATION DOSE REDUCTION: This exam was performed according to the departmental dose-optimization program which includes automated exposure control, adjustment of the mA and/or kV according to patient size and/or use of iterative reconstruction technique. COMPARISON:  Brain MRI 12/28/2022.  Head CT 12/23/2022. FINDINGS: Brain: Mild generalized parenchymal atrophy. There  is no  acute intracranial hemorrhage. No demarcated cortical infarct. No extra-axial fluid collection. No evidence of an intracranial mass. No midline shift. Vascular: No hyperdense vessel.  Atherosclerotic calcifications. Skull: No calvarial fracture or aggressive osseous lesion. Sinuses/Orbits: No mass or acute finding within the imaged orbits. Postsurgical appearance of the paranasal sinuses. Minimal mucosal thickening within the bilateral maxillary sinuses. IMPRESSION: 1. No evidence of an acute intracranial abnormality. 2. Mild generalized parenchymal atrophy. 3. Minimal mucosal thickening within the bilateral maxillary sinuses. Electronically Signed   By: Jackey Loge D.O.   On: 02/22/2023 10:56   DG Chest Port 1 View  Result Date: 02/22/2023 CLINICAL DATA:  Altered mental status. EXAM: PORTABLE CHEST 1 VIEW COMPARISON:  12/26/2022 FINDINGS: The heart size and mediastinal contours are within normal limits. Stable elevation of left hemidiaphragm. Both lungs are clear. IMPRESSION: No active disease. Electronically Signed   By: Danae Orleans M.D.   On: 02/22/2023 10:42    Radiological Exams on Admission: CT Head Wo Contrast  Result Date: 02/22/2023 CLINICAL DATA:  Provided history: Mental status change, unknown cause. Multiple falls. EXAM: CT HEAD WITHOUT CONTRAST TECHNIQUE: Contiguous axial images were obtained from the base of the skull through the vertex without intravenous contrast. RADIATION DOSE REDUCTION: This exam was performed according to the departmental dose-optimization program which includes automated exposure control, adjustment of the mA and/or kV according to patient size and/or use of iterative reconstruction technique. COMPARISON:  Brain MRI 12/28/2022.  Head CT 12/23/2022. FINDINGS: Brain: Mild generalized parenchymal atrophy. There is no acute intracranial hemorrhage. No demarcated cortical infarct. No extra-axial fluid collection. No evidence of an intracranial mass. No midline shift.  Vascular: No hyperdense vessel.  Atherosclerotic calcifications. Skull: No calvarial fracture or aggressive osseous lesion. Sinuses/Orbits: No mass or acute finding within the imaged orbits. Postsurgical appearance of the paranasal sinuses. Minimal mucosal thickening within the bilateral maxillary sinuses. IMPRESSION: 1. No evidence of an acute intracranial abnormality. 2. Mild generalized parenchymal atrophy. 3. Minimal mucosal thickening within the bilateral maxillary sinuses. Electronically Signed   By: Jackey Loge D.O.   On: 02/22/2023 10:56   DG Chest Port 1 View  Result Date: 02/22/2023 CLINICAL DATA:  Altered mental status. EXAM: PORTABLE CHEST 1 VIEW COMPARISON:  12/26/2022 FINDINGS: The heart size and mediastinal contours are within normal limits. Stable elevation of left hemidiaphragm. Both lungs are clear. IMPRESSION: No active disease. Electronically Signed   By: Danae Orleans M.D.   On: 02/22/2023 10:42    DVT Prophylaxis -SCD/Eliquis AM Labs Ordered, also please review Full Orders  Family Communication: Admission, patients condition and plan of care including tests being ordered have been discussed with the patient  who indicate understanding and agree with the plan   Condition  -stable  Shon Hale M.D on 02/22/2023 at 5:12 PM Go to [email or web address] -  for contact info  [facility] - Office  [phone]

## 2023-02-22 NOTE — ED Notes (Addendum)
Dr. Mariea Clonts informed nurse to continue oral and IV K+ and re-eval for transfer to telemetry.

## 2023-02-23 DIAGNOSIS — Z8249 Family history of ischemic heart disease and other diseases of the circulatory system: Secondary | ICD-10-CM | POA: Diagnosis not present

## 2023-02-23 DIAGNOSIS — E785 Hyperlipidemia, unspecified: Secondary | ICD-10-CM | POA: Diagnosis not present

## 2023-02-23 DIAGNOSIS — E876 Hypokalemia: Secondary | ICD-10-CM

## 2023-02-23 DIAGNOSIS — I480 Paroxysmal atrial fibrillation: Secondary | ICD-10-CM | POA: Diagnosis not present

## 2023-02-23 DIAGNOSIS — Z1152 Encounter for screening for COVID-19: Secondary | ICD-10-CM | POA: Diagnosis not present

## 2023-02-23 DIAGNOSIS — E871 Hypo-osmolality and hyponatremia: Secondary | ICD-10-CM | POA: Diagnosis not present

## 2023-02-23 DIAGNOSIS — Z66 Do not resuscitate: Secondary | ICD-10-CM | POA: Diagnosis not present

## 2023-02-23 DIAGNOSIS — Z95828 Presence of other vascular implants and grafts: Secondary | ICD-10-CM | POA: Diagnosis not present

## 2023-02-23 DIAGNOSIS — B965 Pseudomonas (aeruginosa) (mallei) (pseudomallei) as the cause of diseases classified elsewhere: Secondary | ICD-10-CM | POA: Diagnosis present

## 2023-02-23 DIAGNOSIS — L89100 Pressure ulcer of unspecified part of back, unstageable: Secondary | ICD-10-CM | POA: Diagnosis not present

## 2023-02-23 DIAGNOSIS — Z87891 Personal history of nicotine dependence: Secondary | ICD-10-CM | POA: Diagnosis not present

## 2023-02-23 DIAGNOSIS — N390 Urinary tract infection, site not specified: Secondary | ICD-10-CM | POA: Diagnosis not present

## 2023-02-23 DIAGNOSIS — Z86718 Personal history of other venous thrombosis and embolism: Secondary | ICD-10-CM | POA: Diagnosis not present

## 2023-02-23 DIAGNOSIS — R443 Hallucinations, unspecified: Secondary | ICD-10-CM | POA: Diagnosis not present

## 2023-02-23 DIAGNOSIS — K219 Gastro-esophageal reflux disease without esophagitis: Secondary | ICD-10-CM | POA: Diagnosis not present

## 2023-02-23 DIAGNOSIS — I10 Essential (primary) hypertension: Secondary | ICD-10-CM | POA: Diagnosis not present

## 2023-02-23 DIAGNOSIS — L89102 Pressure ulcer of unspecified part of back, stage 2: Secondary | ICD-10-CM | POA: Diagnosis not present

## 2023-02-23 DIAGNOSIS — L89626 Pressure-induced deep tissue damage of left heel: Secondary | ICD-10-CM | POA: Diagnosis not present

## 2023-02-23 DIAGNOSIS — R296 Repeated falls: Secondary | ICD-10-CM | POA: Diagnosis not present

## 2023-02-23 DIAGNOSIS — Z7901 Long term (current) use of anticoagulants: Secondary | ICD-10-CM | POA: Diagnosis not present

## 2023-02-23 DIAGNOSIS — G8929 Other chronic pain: Secondary | ICD-10-CM | POA: Diagnosis not present

## 2023-02-23 DIAGNOSIS — E440 Moderate protein-calorie malnutrition: Secondary | ICD-10-CM | POA: Diagnosis not present

## 2023-02-23 DIAGNOSIS — E870 Hyperosmolality and hypernatremia: Secondary | ICD-10-CM | POA: Diagnosis not present

## 2023-02-23 DIAGNOSIS — L89112 Pressure ulcer of right upper back, stage 2: Secondary | ICD-10-CM | POA: Diagnosis not present

## 2023-02-23 DIAGNOSIS — E11649 Type 2 diabetes mellitus with hypoglycemia without coma: Secondary | ICD-10-CM | POA: Diagnosis not present

## 2023-02-23 DIAGNOSIS — Z794 Long term (current) use of insulin: Secondary | ICD-10-CM | POA: Diagnosis not present

## 2023-02-23 DIAGNOSIS — G9341 Metabolic encephalopathy: Secondary | ICD-10-CM | POA: Diagnosis not present

## 2023-02-23 LAB — BASIC METABOLIC PANEL WITH GFR
Anion gap: 5 (ref 5–15)
BUN: 10 mg/dL (ref 8–23)
CO2: 30 mmol/L (ref 22–32)
Calcium: 7.2 mg/dL — ABNORMAL LOW (ref 8.9–10.3)
Chloride: 113 mmol/L — ABNORMAL HIGH (ref 98–111)
Creatinine, Ser: 0.89 mg/dL (ref 0.61–1.24)
GFR, Estimated: >60 mL/min (ref >60)
Glucose, Bld: 58 mg/dL — ABNORMAL LOW (ref 70–99)
Potassium: 2.6 mmol/L — CL (ref 3.5–5.1)
Sodium: 148 mmol/L — ABNORMAL HIGH (ref 135–145)

## 2023-02-23 LAB — BASIC METABOLIC PANEL
Anion gap: 5 (ref 5–15)
BUN: 9 mg/dL (ref 8–23)
CO2: 27 mmol/L (ref 22–32)
Calcium: 7.2 mg/dL — ABNORMAL LOW (ref 8.9–10.3)
Chloride: 113 mmol/L — ABNORMAL HIGH (ref 98–111)
Creatinine, Ser: 0.8 mg/dL (ref 0.61–1.24)
GFR, Estimated: >60 mL/min (ref >60)
Glucose, Bld: 100 mg/dL — ABNORMAL HIGH (ref 70–99)
Potassium: 3.9 mmol/L (ref 3.5–5.1)
Sodium: 145 mmol/L (ref 135–145)

## 2023-02-23 LAB — CBC
HCT: 26.5 % — ABNORMAL LOW (ref 39.0–52.0)
Hemoglobin: 7.9 g/dL — ABNORMAL LOW (ref 13.0–17.0)
MCH: 28.6 pg (ref 26.0–34.0)
MCHC: 29.8 g/dL — ABNORMAL LOW (ref 30.0–36.0)
MCV: 96 fL (ref 80.0–100.0)
Platelets: 161 10*3/uL (ref 150–400)
RBC: 2.76 MIL/uL — ABNORMAL LOW (ref 4.22–5.81)
RDW: 21.5 % — ABNORMAL HIGH (ref 11.5–15.5)
WBC: 6.1 10*3/uL (ref 4.0–10.5)
nRBC: 0 % (ref 0.0–0.2)

## 2023-02-23 LAB — GLUCOSE, CAPILLARY
Glucose-Capillary: 118 mg/dL — ABNORMAL HIGH (ref 70–99)
Glucose-Capillary: 49 mg/dL — ABNORMAL LOW (ref 70–99)
Glucose-Capillary: 96 mg/dL (ref 70–99)
Glucose-Capillary: 88 mg/dL (ref 70–99)
Glucose-Capillary: 125 mg/dL — ABNORMAL HIGH (ref 70–99)

## 2023-02-23 LAB — MRSA NEXT GEN BY PCR, NASAL: MRSA by PCR Next Gen: NOT DETECTED

## 2023-02-23 MED ORDER — ENSURE ENLIVE PO LIQD
237.0000 mL | Freq: Two times a day (BID) | ORAL | Status: DC
  Administered 2023-02-24 – 2023-02-25 (×3): 237 mL via ORAL

## 2023-02-23 MED ORDER — INSULIN GLARGINE-YFGN 100 UNIT/ML ~~LOC~~ SOLN
10.0000 [IU] | Freq: Every day | SUBCUTANEOUS | Status: DC
  Filled 2023-02-23: qty 0.1

## 2023-02-23 MED ORDER — POTASSIUM CHLORIDE IN NACL 20-0.45 MEQ/L-% IV SOLN
INTRAVENOUS | Status: DC
  Filled 2023-02-23 (×7): qty 1000

## 2023-02-23 MED ORDER — POTASSIUM CHLORIDE CRYS ER 20 MEQ PO TBCR
40.0000 meq | EXTENDED_RELEASE_TABLET | ORAL | Status: AC
  Administered 2023-02-23 (×2): 40 meq via ORAL
  Filled 2023-02-23: qty 2

## 2023-02-23 MED ORDER — POTASSIUM CHLORIDE 10 MEQ/100ML IV SOLN
10.0000 meq | INTRAVENOUS | Status: AC
  Administered 2023-02-23 (×4): 10 meq via INTRAVENOUS
  Filled 2023-02-23 (×3): qty 100

## 2023-02-23 MED ORDER — INSULIN GLARGINE-YFGN 100 UNIT/ML ~~LOC~~ SOLN
12.0000 [IU] | Freq: Every day | SUBCUTANEOUS | Status: DC
  Administered 2023-02-23: 12 [IU] via SUBCUTANEOUS
  Filled 2023-02-23 (×2): qty 0.12

## 2023-02-23 MED ORDER — CEFADROXIL 500 MG PO CAPS
1000.0000 mg | ORAL_CAPSULE | Freq: Two times a day (BID) | ORAL | Status: DC
  Administered 2023-02-23 – 2023-02-24 (×3): 1000 mg via ORAL
  Filled 2023-02-23 (×7): qty 2

## 2023-02-23 MED ORDER — POTASSIUM CHLORIDE CRYS ER 20 MEQ PO TBCR
40.0000 meq | EXTENDED_RELEASE_TABLET | Freq: Once | ORAL | Status: AC
  Administered 2023-02-23: 40 meq via ORAL
  Filled 2023-02-23: qty 2

## 2023-02-23 MED ORDER — POTASSIUM CHLORIDE 10 MEQ/100ML IV SOLN
10.0000 meq | INTRAVENOUS | Status: AC
  Administered 2023-02-23 (×2): 10 meq via INTRAVENOUS
  Filled 2023-02-23 (×2): qty 100

## 2023-02-23 MED ORDER — POTASSIUM CHLORIDE CRYS ER 20 MEQ PO TBCR
40.0000 meq | EXTENDED_RELEASE_TABLET | ORAL | Status: AC
  Administered 2023-02-23: 40 meq via ORAL
  Filled 2023-02-23 (×2): qty 2

## 2023-02-23 NOTE — Care Management Obs Status (Signed)
MEDICARE OBSERVATION STATUS NOTIFICATION   Patient Details  Name: Jim Ramirez MRN: [id number] Date of Birth: [birth year]   Medicare Observation Status Notification Given:  Yes    Villa Herb, Theresia Majors 02/23/2023, 12:59 PM

## 2023-02-23 NOTE — Progress Notes (Signed)
PROGRESS NOTE     Jim Ramirez, is a 72 y.o. male, DOB - [birth year], EPP:[id number]  Admit date - 02/22/2023   Admitting Physician Candie Gintz Mariea Clonts, MD  Outpatient Primary MD for the patient is [name], MD  LOS - 0  Chief Complaint  Patient presents with   Altered Mental Status      Brief Narrative:  72 y.o. 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 Lovell Sheehan on 01/20/2023 , as well as a diagnosis of left lower extremity DVT status post IVC filter placement on 01/23/2023 admitted on 02/22/2023 with persistent hypokalemia possible UTI and acute metabolic encephalopathy    -Assessment and Plan: 1)Acute Metabolic Encephalopathy---??  Etiology -CT without acute findings -No leukocytosis -Chest x-ray without acute findings -Ammonia is not elevated -?? 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 Lovell Sheehan on 01/20/2023  --He completed IV Ancef on 02/18/2023 and was transitioned to p.o.cefadroxil 1000 mg bid -c/n Cefadroxil 1000 mg --bid for at least 10 months (Ten Months)-- from 02/18/23 as per ID -Low threshold for repeat lumbar imaging   3)Possible UTI--- antibiotics as above #2 -Received Diflucan for yeast in urine   4)eft  lower extremity DVT status post IVC filter placement on 01/23/2023 --- continue Eliquis   5)PAFib---EKG A-fib RBB heart rate in the 80s -TTE and TEE from June 2024 with EF of 65% without aortic stenosis -Continue amiodarone and metoprolol for rate control -Eliquis for stroke prophylaxis   6) severe hypokalemia---  -Magnesium WNL -Phosphorous WNL -Hold Lasix 02/23/23 Potassium remains quite low despite oral and IV replacements requiring additional replacements -Replace potassium   7) generalized weakness/ambulatory dysfunction/recurrent falls in the setting of recent back surgery =--Patient was getting PT at [facility] - get PT eval   8)Decubitus Ulcer/PI--POA -Wound care consult requested   9)DM2-recent A1c 8.5 Use Novolog/Humalog Sliding scale insulin with Accu-Cheks/Fingersticks as ordered  02/23/23 -Episode of hypoglycemia overnight - Reduce Semglee to 12  units from 15 units nightly   10)GERD--continue Protonix   11)HLD--continue Crestor   Status is: Inpatient   Disposition: The patient is from: SNF              Anticipated d/c is to: SNF              Anticipated d/c date is: 1 day              Patient currently is not medically stable to d/c. Barriers: Not Clinically Stable-   Code Status :  -  Code Status: DNR   Family Communication:   NA (patient is alert, awake and coherent)   DVT Prophylaxis  :   - SCDs  SCDs Start: 02/22/23 1301 Place TED hose Start: 02/22/23 1301 apixaban (ELIQUIS) tablet 5 mg   Lab Results  Component Value Date   PLT 161 02/23/2023  Inpatient Medications  Scheduled Meds:  amiodarone  200 mg Oral Daily   apixaban  5 mg Oral BID   cefadroxil  1,000 mg Oral BID   Chlorhexidine Gluconate Cloth  6 each Topical Daily   escitalopram  10 mg Oral Daily   ferrous sulfate  325 mg Oral Q breakfast   gabapentin  100 mg Oral BID   insulin aspart  0-15 Units Subcutaneous TID WC   insulin aspart  0-5 Units Subcutaneous QHS    insulin aspart  3 Units Subcutaneous TID WC   insulin glargine-yfgn  10 Units Subcutaneous QHS   metoprolol tartrate  12.5 mg Oral BID   pantoprazole  40 mg Oral Daily   potassium chloride  40 mEq Oral Q3H   potassium chloride  40 mEq Oral Q3H   risperiDONE  0.5 mg Oral QHS   rosuvastatin  20 mg Oral QHS   senna-docusate  2 tablet Oral QHS   sodium chloride flush  3 mL Intravenous Q12H   sodium chloride flush  3 mL Intravenous Q12H   tamsulosin  0.4 mg Oral Daily   Continuous Infusions:  0.45 % NaCl with KCl 20 mEq / L 125 mL/hr at 02/23/23 1158   sodium chloride     potassium chloride     PRN Meds:.sodium chloride, acetaminophen **OR** acetaminophen, bisacodyl, ondansetron **OR** ondansetron (ZOFRAN) IV, polyethylene glycol, sodium chloride flush, traZODone   Anti-infectives (From admission, onward)    Start     Dose/Rate Route Frequency Ordered Stop   02/23/23 1000  cefadroxil (DURICEF) capsule 1,000 mg        1,000 mg Oral 2 times daily 02/23/23 0813     02/22/23 1830  fluconazole (DIFLUCAN) tablet 150 mg        150 mg Oral  Once 02/22/23 1805 02/22/23 1844   02/22/23 1800  cefadroxil (DURICEF) capsule 500 mg  Status:  Discontinued        500 mg Oral 2 times daily 02/22/23 1727 02/23/23 0813   02/22/23 1730  fluconazole (DIFLUCAN) tablet 200 mg  Status:  Discontinued        200 mg Oral Daily 02/22/23 1728 02/22/23 1805         Subjective: [name] today has no fevers, no emesis,  No chest pain,    -Confusion and disorientation improving -Had episode of hypoglycemia  Objective: Vitals:   02/23/23 0807 02/23/23 0900 02/23/23 1100 02/23/23 1200  BP:  117/77 126/84   Pulse:  (!) 106    Resp:  18 20   Temp: 99.5 F (37.5 C)   98.9 F (37.2 C)  TempSrc: Axillary   Axillary  SpO2:  96%    Weight:      Height:        Intake/Output Summary (Last 24 hours) at 02/23/2023 1226 Last data filed at 02/23/2023 1100 Gross per 24 hour  Intake 1014.31 ml  Output --   Net 1014.31 ml   Filed Weights   02/22/23 0927 02/22/23 2011  Weight: 88.5 kg 85.6 kg    Physical Exam  Gen:- Awake Alert, in no acute distress, cooperative HEENT:- [place].AT, No sclera icterus Neck-Supple Neck,No JVD,.  Lungs-  CTAB , fair symmetrical air movement CV- S1, S2 normal, irregular  Abd-  +ve B.Sounds, Abd Soft, No tenderness,    Extremity/Skin:-  pedal pulses present , decubitus ulcer/pressure injury on mid back--see photos in epic Psych-affect is appropriate, oriented x3, much less confused, less disoriented, no further psychosis/hallucinations at this  time Neuro-generalized weakness, no new focal deficits, no tremors  Data Reviewed: I have personally reviewed following labs and imaging studies  CBC: Recent Labs  Lab 02/22/23 1038 02/23/23 0435  WBC 10.5 6.1  NEUTROABS 7.6  --   HGB 8.6* 7.9*  HCT 28.5* 26.5*  MCV 96.0 96.0  PLT 125* 161   Basic Metabolic Panel: Recent Labs  Lab 02/22/23 1038 02/22/23 1237 02/22/23 1548 02/23/23 0435  NA 144  --  147* 148*  K 2.2*  --  2.4* 2.6*  CL 104  --  108 113*  CO2 30  --  29 30  GLUCOSE 112*  --  85 58*  BUN 14  --  12 10  CREATININE 0.99  --  0.93 0.89  CALCIUM 7.5*  --  7.5* 7.2*  MG  --  1.8  --   --   PHOS  --   --  2.7  --    GFR: Estimated Creatinine Clearance: 84.8 mL/min (by C-G formula based on SCr of 0.89 mg/dL). Liver Function Tests: Recent Labs  Lab 02/22/23 1038 02/22/23 1548  AST 28  --   ALT <5  --   ALKPHOS 93  --   BILITOT 0.9  --   PROT 6.0*  --   ALBUMIN 1.8* 1.7*   Recent Results (from the past 240 hour(s))  SARS Coronavirus 2 by RT PCR (hospital order, performed in [facility] hospital lab) *cepheid single result test* Anterior Nasal Swab     Status: None   Collection Time: 02/22/23  9:56 AM   Specimen: Anterior Nasal Swab  Result Value Ref Range Status   SARS Coronavirus 2 by RT PCR NEGATIVE NEGATIVE Final    Comment: (NOTE) SARS-CoV-2 target nucleic acids are NOT  DETECTED.  The SARS-CoV-2 RNA is generally detectable in upper and lower respiratory specimens during the acute phase of infection. The lowest concentration of SARS-CoV-2 viral copies this assay can detect is 250 copies / mL. A negative result does not preclude SARS-CoV-2 infection and should not be used as the sole basis for treatment or other patient management decisions.  A negative result may occur with improper specimen collection / handling, submission of specimen other than nasopharyngeal swab, presence of viral mutation(s) within the areas targeted by this assay, and inadequate number of viral copies (<250 copies / mL). A negative result must be combined with clinical observations, patient history, and epidemiological information.  Fact Sheet for Patients:   RoadLapTop.co.za  Fact Sheet for Healthcare Providers: http://kim-miller.com/  This test is not yet approved or  cleared by the [place] FDA and has been authorized for detection and/or diagnosis of SARS-CoV-2 by FDA under an Emergency Use Authorization (EUA).  This EUA will remain in effect (meaning this test can be used) for the duration of the COVID-19 declaration under Section 564(b)(1) of the Act, 21 U.S.C. section 360bbb-3(b)(1), unless the authorization is terminated or revoked sooner.  Performed at [facility], [address], [place], [place] [address]   Culture, blood (routine x 2)     Status: None (Preliminary result)   Collection Time: 02/22/23 10:18 AM   Specimen: BLOOD LEFT WRIST  Result Value Ref Range Status   Specimen Description   Final    BLOOD LEFT WRIST BOTTLES DRAWN AEROBIC AND ANAEROBIC   Special Requests Blood Culture adequate volume  Final   Culture   Final    NO GROWTH < 24 HOURS Performed at [facility], [address],  [place], [place] [address]    Report Status PENDING  Incomplete  Culture, blood (routine x 2)     Status: None  (Preliminary result)   Collection Time: 02/22/23 10:30 AM   Specimen: BLOOD RIGHT WRIST  Result Value Ref Range Status   Specimen Description   Final    BLOOD RIGHT WRIST BOTTLES DRAWN AEROBIC AND ANAEROBIC   Special Requests Blood Culture adequate volume  Final   Culture   Final    NO GROWTH < 24 HOURS Performed at [facility], [address], [place], [place] [address]    Report Status PENDING  Incomplete  MRSA Next Gen by PCR, Nasal     Status: None   Collection Time: 02/22/23  8:01 PM   Specimen: Nasal Mucosa; Nasal Swab  Result Value Ref Range Status   MRSA by PCR Next Gen NOT DETECTED NOT DETECTED Final    Comment: (NOTE) The GeneXpert MRSA Assay (FDA approved for NASAL specimens only), is one component of a comprehensive MRSA colonization surveillance program. It is not intended to diagnose MRSA infection nor to guide or monitor treatment for MRSA infections. Test performance is not FDA approved in patients less than 65 years old. Performed at [facility], [address], [place], [place] [address]       Radiology Studies: CT Head Wo Contrast  Result Date: 02/22/2023 CLINICAL DATA:  Provided history: Mental status change, unknown cause. Multiple falls. EXAM: CT HEAD WITHOUT CONTRAST TECHNIQUE: Contiguous axial images were obtained from the base of the skull through the vertex without intravenous contrast. RADIATION DOSE REDUCTION: This exam was performed according to the departmental dose-optimization program which includes automated exposure control, adjustment of the mA and/or kV according to patient size and/or use of iterative reconstruction technique. COMPARISON:  Brain MRI 12/28/2022.  Head CT 12/23/2022. FINDINGS: Brain: Mild generalized parenchymal atrophy. There is no acute intracranial hemorrhage. No demarcated cortical infarct. No extra-axial fluid collection. No evidence of an intracranial mass. No midline shift. Vascular: No hyperdense vessel.  Atherosclerotic  calcifications. Skull: No calvarial fracture or aggressive osseous lesion. Sinuses/Orbits: No mass or acute finding within the imaged orbits. Postsurgical appearance of the paranasal sinuses. Minimal mucosal thickening within the bilateral maxillary sinuses. IMPRESSION: 1. No evidence of an acute intracranial abnormality. 2. Mild generalized parenchymal atrophy. 3. Minimal mucosal thickening within the bilateral maxillary sinuses. Electronically Signed   By: Jackey Loge D.O.   On: 02/22/2023 10:56   DG Chest Port 1 View  Result Date: 02/22/2023 CLINICAL DATA:  Altered mental status. EXAM: PORTABLE CHEST 1 VIEW COMPARISON:  12/26/2022 FINDINGS: The heart size and mediastinal contours are within normal limits. Stable elevation of left hemidiaphragm. Both lungs are clear. IMPRESSION: No active disease. Electronically Signed   By: Danae Orleans M.D.   On: 02/22/2023 10:42     Scheduled Meds:  amiodarone  200 mg Oral Daily   apixaban  5 mg Oral BID   cefadroxil  1,000 mg Oral BID   Chlorhexidine Gluconate Cloth  6 each Topical Daily   escitalopram  10 mg Oral Daily   ferrous sulfate  325 mg Oral Q breakfast   gabapentin  100 mg Oral BID   insulin aspart  0-15 Units Subcutaneous TID WC   insulin aspart  0-5 Units Subcutaneous QHS   insulin aspart  3 Units Subcutaneous TID WC   insulin glargine-yfgn  10 Units Subcutaneous QHS   metoprolol tartrate  12.5 mg Oral BID   pantoprazole  40 mg Oral  Daily   potassium chloride  40 mEq Oral Q3H   potassium chloride  40 mEq Oral Q3H   risperiDONE  0.5 mg Oral QHS   rosuvastatin  20 mg Oral QHS   senna-docusate  2 tablet Oral QHS   sodium chloride flush  3 mL Intravenous Q12H   sodium chloride flush  3 mL Intravenous Q12H   tamsulosin  0.4 mg Oral Daily   Continuous Infusions:  0.45 % NaCl with KCl 20 mEq / L 125 mL/hr at 02/23/23 1158   sodium chloride     potassium chloride       LOS: 0 days    Shon Hale M.D on 02/23/2023 at 12:26 PM  Go  to [email or web address] - for contact info  [facility] - Office  [phone]  If 7PM-7AM, please contact night-coverage [email or web address] 02/23/2023, 12:26 PM

## 2023-02-23 NOTE — TOC Initial Note (Addendum)
Transition of Care [facility]) - Initial/Assessment Note    Patient Details  Name: Jim Ramirez MRN: [id number] Date of Birth: 04-09-1951  [facility] CM/SW Contact:    Villa Herb, LCSWA Phone Number: 02/23/2023, 12:47 PM  Clinical Narrative:                 Pt arrived to hospital from [facility]. CSW spoke with Eunice Blase in admissions who states pt is at their facility for short term rehab. Pt will need to be seen by PT here at the hospital. [facility] will start insurance auth once PT has eval in. CSW left VM for pts son and attempted to reach pts brother but VM box was full. CSW spoke with pts sister who states they would be interested in pts SNF referral being sent to [facility] for review. CSW explained that once PT has worked with pt referral can be sent out to facilities for review. TOC to follow.   Expected Discharge Plan: Skilled Nursing Facility Barriers to Discharge: Continued Medical Work up   Patient Goals and CMS Choice Patient states their goals for this hospitalization and ongoing recovery are:: return to SNF CMS Medicare.gov Compare Post Acute Care list provided to:: Patient Represenative (must comment) Choice offered to / list presented to : Adult Children [place] ownership interest in [facility].provided to:: Adult Children    Expected Discharge Plan and Services In-house Referral: Clinical Social Work Discharge Planning Services: CM Consult Post Acute Care Choice: Skilled Nursing Facility Living arrangements for the past 2 months: Single Family Home                                      Prior Living Arrangements/Services Living arrangements for the past 2 months: Single Family Home Lives with:: Self Patient language and need for interpreter reviewed:: Yes Do you feel safe going back to the place where you live?: Yes      Need for Family Participation in Patient Care: Yes (Comment) Care giver support system in place?: Yes (comment)    Criminal Activity/Legal Involvement Pertinent to Current Situation/Hospitalization: No - Comment as needed  Activities of Daily Living Home Assistive Devices/Equipment: Cane (specify quad or straight), CBG Meter, Eyeglasses, Dentures (specify type), Grab bars in shower, Grab bars around toilet, Wheelchair, Environmental consultant (specify type) ADL Screening (condition at time of admission) Patient's cognitive ability adequate to safely complete daily activities?: Yes Is the patient deaf or have difficulty hearing?: No Does the patient have difficulty seeing, even when wearing glasses/contacts?: No Does the patient have difficulty concentrating, remembering, or making decisions?: No Patient able to express need for assistance with ADLs?: Yes Does the patient have difficulty dressing or bathing?: Yes Independently performs ADLs?: No Communication: Independent Dressing (OT): Needs assistance Is this a change from baseline?: Pre-admission baseline Grooming: Needs assistance Is this a change from baseline?: Pre-admission baseline Feeding: Independent Bathing: Needs assistance Is this a change from baseline?: Pre-admission baseline Toileting: Needs assistance Is this a change from baseline?: Pre-admission baseline In/Out Bed: Needs assistance Is this a change from baseline?: Pre-admission baseline Walks in Home: Needs assistance Is this a change from baseline?: Pre-admission baseline Does the patient have difficulty walking or climbing stairs?: Yes Weakness of Legs: Both Weakness of Arms/Hands: None  Permission Sought/Granted                  Emotional Assessment  Alcohol / Substance Use: Not Applicable Psych Involvement: No (comment)  Admission diagnosis:  Hallucinations [R44.3] Hypokalemia [E87.6] General weakness [R53.1] Frequent falls [R29.6] Patient Active Problem List   Diagnosis Date Noted   Hypokalemia 02/22/2023   Paroxysmal atrial fibrillation with RVR (HCC)  02/22/2023   Malnutrition of moderate degree 01/14/2023   MSSA bacteremia 12/26/2022   New onset atrial fibrillation (HCC) 12/24/2022   Discitis of lumbar region 12/24/2022   Sepsis (HCC) 12/24/2022   BPH (benign prostatic hyperplasia) 12/24/2022   Mood disorder (HCC) 12/24/2022   Cervical spondylosis with myelopathy and radiculopathy 05/22/2022   Diabetes mellitus type 2 in nonobese (HCC) 02/26/2008   Dyslipidemia 02/26/2008   Essential hypertension 02/26/2008   INTERNAL HEMORRHOIDS 02/26/2008   EXTERNAL HEMORRHOIDS 02/26/2008   Duodenal ulcer without hemorrhage or perforation and without obstruction 02/26/2008   GASTRITIS 02/26/2008   DUODENITIS 02/26/2008   MELENA 02/26/2008   NECK PAIN 02/26/2008   INSOMNIA 02/26/2008   SLEEP APNEA 02/26/2008   ABDOMINAL CRAMPS 02/26/2008   RECTAL BLEEDING, HX OF 02/26/2008   PCP:  [name], MD Pharmacy:   [facility] [place] - [place], [place] - [address] [place] [address] Phone: [phone] Fax: [phone]     Social Determinants of Health (SDOH) Social History: SDOH Screenings   Food Insecurity: No Food Insecurity (02/22/2023)  Housing: Low Risk  (02/22/2023)  Transportation Needs: No Transportation Needs (02/22/2023)  Utilities: Not At Risk (02/22/2023)  Tobacco Use: Medium Risk (02/22/2023)   SDOH Interventions:     Readmission Risk Interventions     No data to display

## 2023-02-24 DIAGNOSIS — L89100 Pressure ulcer of unspecified part of back, unstageable: Secondary | ICD-10-CM

## 2023-02-24 DIAGNOSIS — L89109 Pressure ulcer of unspecified part of back, unspecified stage: Secondary | ICD-10-CM | POA: Insufficient documentation

## 2023-02-24 DIAGNOSIS — E876 Hypokalemia: Secondary | ICD-10-CM | POA: Diagnosis not present

## 2023-02-24 DIAGNOSIS — L89112 Pressure ulcer of right upper back, stage 2: Secondary | ICD-10-CM | POA: Insufficient documentation

## 2023-02-24 LAB — GLUCOSE, CAPILLARY
Glucose-Capillary: 57 mg/dL — ABNORMAL LOW (ref 70–99)
Glucose-Capillary: 111 mg/dL — ABNORMAL HIGH (ref 70–99)
Glucose-Capillary: 83 mg/dL (ref 70–99)
Glucose-Capillary: 100 mg/dL — ABNORMAL HIGH (ref 70–99)
Glucose-Capillary: 94 mg/dL (ref 70–99)

## 2023-02-24 MED ORDER — MEDIHONEY WOUND/BURN DRESSING EX PSTE
1.0000 | PASTE | Freq: Every day | CUTANEOUS | Status: DC
  Administered 2023-02-24 – 2023-02-26 (×3): 1 via TOPICAL
  Filled 2023-02-24: qty 44

## 2023-02-24 MED ORDER — SODIUM CHLORIDE 0.9 % IV SOLN
2.0000 g | Freq: Two times a day (BID) | INTRAVENOUS | Status: DC
  Administered 2023-02-24 – 2023-02-26 (×4): 2 g via INTRAVENOUS
  Filled 2023-02-24 (×4): qty 12.5

## 2023-02-24 MED ORDER — NYSTATIN-TRIAMCINOLONE 100000-0.1 UNIT/GM-% EX CREA
TOPICAL_CREAM | Freq: Two times a day (BID) | CUTANEOUS | Status: DC
  Filled 2023-02-24 (×2): qty 15

## 2023-02-24 MED ORDER — INSULIN GLARGINE-YFGN 100 UNIT/ML ~~LOC~~ SOLN
10.0000 [IU] | Freq: Every day | SUBCUTANEOUS | Status: DC
  Administered 2023-02-24: 10 [IU] via SUBCUTANEOUS
  Filled 2023-02-24 (×2): qty 0.1

## 2023-02-24 NOTE — Plan of Care (Signed)
  Problem: Acute Rehab OT Goals (only OT should resolve) Goal: Pt. Will Perform Grooming Flowsheets (Taken 02/24/2023 0920) Pt Will Perform Grooming:  with min guard assist  sitting Goal: Pt. Will Perform Lower Body Bathing Flowsheets (Taken 02/24/2023 0920) Pt Will Perform Lower Body Bathing:  with min guard assist  sitting/lateral leans Goal: Pt. Will Perform Upper Body Dressing Flowsheets (Taken 02/24/2023 0920) Pt Will Perform Upper Body Dressing:  with modified independence  sitting Goal: Pt. Will Perform Lower Body Dressing Flowsheets (Taken 02/24/2023 0920) Pt Will Perform Lower Body Dressing:  with min guard assist  sitting/lateral leans Goal: Pt. Will Transfer To Toilet Flowsheets (Taken 02/24/2023 0920) Pt Will Transfer to Toilet:  with min assist  stand pivot transfer Goal: Pt. Will Perform Toileting-Clothing Manipulation Flowsheets (Taken 02/24/2023 0920) Pt Will Perform Toileting - Clothing Manipulation and hygiene:  with min assist  sitting/lateral leans Goal: Pt/Caregiver Will Perform Home Exercise Program Flowsheets (Taken 02/24/2023 0920) Pt/caregiver will Perform Home Exercise Program:  Increased ROM  Increased strength  Right Upper extremity  Left upper extremity  Independently  Eulah Walkup OT, MOT

## 2023-02-24 NOTE — Progress Notes (Signed)
Safety mitts off while PT and OT worked with patient. Will continue to leave off.

## 2023-02-24 NOTE — Progress Notes (Signed)
Occupational Therapy Evaluation Patient Details Name: Jim Ramirez MRN: [id number] DOB: October 09, 1950 Today's Date: 02/24/2023   History of Present Illness Jim Ramirez  is a 72 y.o. 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 Lovell Sheehan on 01/20/2023 , as well as a diagnosis of left lower extremity DVT status post IVC filter placement on 01/23/2023 presenting from [facility] rehab with recurrent falls and generalized weakness and altered mentation with psychosis/hallucinations (per MD)   Clinical Impression   Pt agreeable to OT and PT co-evaluation. Pt was indepdnent prior to SNF placement a few weeks ago, per pt's report. Pt is needed mod to max A for bed mobility, transfers, and lower body dressing. Pt is weak with poor standing tolerance. Much assist needed at this time. Pt was oriented to place and date. Able to follow commands. Pt left in the chair with PT staff present. Pt will benefit from continued OT in the hospital and recommended venue below to increase strength, balance, and endurance for safe ADL's.         Recommendations for follow up therapy are one component of a multi-disciplinary discharge planning process, led by the attending physician.  Recommendations may be updated based on patient status, additional functional criteria and insurance authorization.   Assistance Recommended at Discharge Frequent or constant Supervision/Assistance  Patient can return home with the following A lot of help with walking and/or transfers;A lot of help with bathing/dressing/bathroom;Assistance with cooking/housework;Assist for transportation;Help with  stairs or ramp for entrance    Functional Status Assessment  Patient has had a recent decline in their functional status and demonstrates the ability to make significant improvements in function in a reasonable and predictable amount of time.  Equipment Recommendations  None recommended by OT    Recommendations for Other Services       Precautions / Restrictions Precautions Precautions: Fall Restrictions Weight Bearing Restrictions: No      Mobility Bed Mobility Overal bed mobility: Needs Assistance Bed Mobility: Rolling, Sidelying to Sit Rolling: Mod assist Sidelying to sit: Mod assist, Max assist       General bed mobility comments: slow labored movement    Transfers Overall transfer level: Needs assistance Equipment used: Rolling walker (2 wheels) Transfers: Sit to/from Stand, Bed to chair/wheelchair/BSC Sit to Stand: Mod assist     Step pivot transfers: Mod assist, Max assist     General transfer comment: Unsteady in standing with poor endurance.      Balance Overall balance assessment: Needs assistance Sitting-balance support: Bilateral upper extremity supported, Feet supported Sitting balance-Leahy Scale: Fair Sitting balance - Comments: seated at EOB   Standing balance support: Bilateral upper extremity supported, During functional activity, Reliant on assistive device for balance Standing balance-Leahy Scale: Poor Standing balance comment: using RW                           ADL either performed or assessed with clinical judgement   ADL Overall ADL's : Needs assistance/impaired Eating/Feeding: Set up;Bed level   Grooming: Minimal assistance;Set up;Sitting   Upper Body Bathing: Minimal assistance;Sitting   Lower Body Bathing: Maximal assistance;Bed level   Upper Body Dressing : Set up;Minimal  assistance;Sitting   Lower Body Dressing: Maximal assistance;Bed level Lower Body Dressing Details (indicate cue type and reason): Assisted to  don socks while supine in bed. Toilet Transfer: Moderate assistance;Maximal assistance;Rolling walker (2 wheels);Stand-pivot Statistician Details (indicate cue type and reason): Simulated via EOB to chair. Toileting- Clothing Manipulation and Hygiene: Maximal assistance;Sit to/from stand Toileting - Clothing Manipulation Details (indicate cue type and reason): Peri-care provided by OT while patient stood with PT.     Functional mobility during ADLs: Maximal assistance       Vision Baseline Vision/History: 1 Wears glasses Ability to See in Adequate Light: 1 Impaired Patient Visual Report: No change from baseline Vision Assessment?: No apparent visual deficits                Pertinent Vitals/Pain Pain Assessment Pain Assessment: Faces Faces Pain Scale: Hurts little more Pain Location: back pain Pain Descriptors / Indicators: Grimacing, Discomfort Pain Intervention(s): Limited activity within patient's tolerance, Monitored during session, Repositioned     Hand Dominance Right   Extremity/Trunk Assessment Upper Extremity Assessment Upper Extremity Assessment: RUE deficits/detail;LUE deficits/detail RUE Deficits / Details: 3-/5 shoulder flexion; generally weak otherwise. LUE Deficits / Details: Limited to 50% A/ROM for shoulder flexion and abduction; pt reports baseline arthritis.   Lower Extremity Assessment Lower Extremity Assessment: Defer to PT evaluation   Cervical / Trunk Assessment Cervical / Trunk Assessment: Kyphotic   Communication Communication Communication: Other (comment);Expressive difficulties (Pt moderately difficult to understand; possibly baseline.)   Cognition Arousal/Alertness: Awake/alert Behavior During Therapy: WFL for tasks assessed/performed Overall Cognitive Status: Within Functional Limits for tasks assessed                                                        Home Living Family/patient expects to be discharged  to:: Skilled nursing facility                                        Prior Functioning/Environment Prior Level of Function : Independent/Modified Independent;Driving             Mobility Comments: walks 10 miles a day (prior to recent SNF placement; likely needing assist at [facility]) ADLs Comments: Ind with ADLs, IADLs, cooks and drivesl. 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 (Prior to recent SNF placement; likely assisted [facility])        OT Problem List: Decreased strength;Decreased range of motion;Decreased activity tolerance;Impaired balance (sitting and/or standing)      OT Treatment/Interventions: Self-care/ADL training;Therapeutic exercise;Therapeutic activities;Patient/family education;Balance training    OT Goals(Current goals can be found in the care plan section) Acute Rehab OT Goals Patient Stated Goal: improve function OT Goal Formulation: With patient Time For Goal Achievement: 03/10/23 Potential to Achieve Goals: Good  OT Frequency: Min 2X/week    Co-evaluation PT/OT/SLP Co-Evaluation/Treatment: Yes Reason for Co-Treatment: To address functional/ADL transfers   OT goals addressed during session: ADL's and self-care                       End of Session Equipment Utilized During Treatment: Rolling walker (2 wheels);Gait belt  Activity Tolerance: Patient tolerated treatment well Patient left: in chair;with call bell/phone within reach;Other (comment) (with PT)  OT Visit Diagnosis: Unsteadiness on  feet (R26.81);Other abnormalities of gait and mobility (R26.89);Muscle weakness (generalized) (M62.81)                Time: 0630-1601 OT Time Calculation (min): 24 min Charges:  OT General Charges $OT Visit: 1 Visit OT Evaluation $OT Eval Low Complexity: 1 Low  Malcom Selmer OT, MOT   Danie Chandler 02/24/2023, 9:18 AM

## 2023-02-24 NOTE — Evaluation (Signed)
Physical Therapy Evaluation Patient Details Name: Jim Ramirez MRN: [id number] DOB: 1950-11-28 Today's Date: 02/24/2023  History of Present Illness  Jim Ramirez  is a 72 y.o. 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 Lovell Sheehan on 01/20/2023 , as well as a diagnosis of left lower extremity DVT status post IVC filter placement on 01/23/2023 presenting from [facility] rehab with recurrent falls and generalized weakness and altered mentation with psychosis/hallucinations   Clinical Impression  Pt tolerated treatment well. Participation was limited due to LE weakness and fatigue. Pt required mod-max assist to transfer from bed to chair with RW. Tolerated sitting up in chair at end of therapy session. Patient will benefit from continued skilled physical therapy in hospital and recommended venue below to increase strength, balance, endurance for safe ADLs and gait.         If plan is discharge home, recommend the following: A lot of help with walking and/or transfers;A lot of help with bathing/dressing/bathroom;Assistance with cooking/housework;Assist for transportation   Can travel by private vehicle        Equipment Recommendations    Recommendations for Other Services       Functional Status Assessment Patient has had a recent decline in their functional status and demonstrates the ability to make significant improvements in function in a reasonable and predictable amount of time.     Precautions / Restrictions Precautions Precautions: Fall Restrictions Weight Bearing Restrictions: No      Mobility  Bed Mobility Overal bed mobility:  Needs Assistance Bed Mobility: Rolling, Sidelying to Sit Rolling: Mod assist Sidelying to sit: Mod assist, Max assist       General bed mobility comments: slow labored movement    Transfers Overall transfer level: Needs assistance Equipment used: Rolling walker (2 wheels) Transfers: Sit to/from Stand, Bed to chair/wheelchair/BSC Sit to Stand: Mod assist   Step pivot transfers: Mod assist, Max assist       General transfer comment: Unsteady in standing with poor endurance, use of RW    Ambulation/Gait Ambulation/Gait assistance: Max assist, Mod assist Gait Distance (Feet): 3 Feet Assistive device: Rolling walker (2 wheels) Gait Pattern/deviations: Step-to pattern, Decreased step length - right, Decreased step length - left, Decreased stride length Gait velocity: slowed     General Gait Details: very labored, LE weakness upon standing > 10 sec  Stairs            Wheelchair Mobility     Tilt Bed    Modified Rankin (Stroke Patients Only)       Balance Overall balance assessment: Needs assistance Sitting-balance support: Bilateral upper extremity supported, Feet supported Sitting balance-Leahy Scale: Fair Sitting balance - Comments: seated at EOB   Standing balance support: Bilateral upper extremity supported, During functional activity, Reliant on assistive device for balance Standing balance-Leahy Scale: Poor Standing balance comment: using RW, LE weakness upon standing at bedside > 10 sec                             Pertinent Vitals/Pain Pain Assessment Pain Assessment: Faces Faces Pain Scale: Hurts little more Pain Location: back pain Pain Descriptors / Indicators: Grimacing, Discomfort Pain  Intervention(s): Limited activity within patient's tolerance, Monitored during session    Home Living Family/patient expects to be discharged to:: Skilled nursing facility                        Prior Function Prior Level of Function :  Independent/Modified Independent;Driving             Mobility Comments: indp prior "per patient" ADLs Comments: Ind with ADLs, IADLs, cooks and drivesl. 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     Hand Dominance   Dominant Hand: Right    Extremity/Trunk Assessment   Upper Extremity Assessment Upper Extremity Assessment: RUE deficits/detail;LUE deficits/detail RUE Deficits / Details: 3-/5 shoulder flexion; generally weak otherwise. LUE Deficits / Details: Limited to 50% A/ROM for shoulder flexion and abduction; pt reports baseline arthritis.    Lower Extremity Assessment Lower Extremity Assessment: Defer to PT evaluation    Cervical / Trunk Assessment Cervical / Trunk Assessment: Kyphotic  Communication   Communication: Other (comment);Expressive difficulties  Cognition Arousal/Alertness: Awake/alert Behavior During Therapy: WFL for tasks assessed/performed Overall Cognitive Status: Within Functional Limits for tasks assessed                                          General Comments      Exercises     Assessment/Plan    PT Assessment Patient needs continued PT services  PT Problem List Decreased strength;Decreased activity tolerance;Decreased balance;Decreased mobility       PT Treatment Interventions DME instruction;Balance training;Gait training;Functional mobility training;Therapeutic activities;Therapeutic exercise    PT Goals (Current goals can be found in the Care Plan section)  Acute Rehab PT Goals Patient Stated Goal: Return home with assist of family PT Goal Formulation: With patient Time For Goal Achievement: 03/10/23 Potential to Achieve Goals: Good    Frequency Min 3X/week     Co-evaluation PT/OT/SLP Co-Evaluation/Treatment: Yes Reason for Co-Treatment: To address functional/ADL transfers   OT goals addressed during session: ADL's and self-care       AM-PAC PT "6 Clicks" Mobility  Outcome Measure Help needed turning from your  back to your side while in a flat bed without using bedrails?: A Little Help needed moving from lying on your back to sitting on the side of a flat bed without using bedrails?: A Little Help needed moving to and from a bed to a chair (including a wheelchair)?: A Lot Help needed standing up from a chair using your arms (e.g., wheelchair or bedside chair)?: A Lot Help needed to walk in hospital room?: A Lot Help needed climbing 3-5 steps with a railing? : Total 6 Click Score: 13    End of Session   Activity Tolerance: Patient tolerated treatment well;Patient limited by fatigue Patient left: in chair;with call bell/phone within reach;with chair alarm set Nurse Communication: Mobility status PT Visit Diagnosis: Unsteadiness on feet (R26.81);Other abnormalities of gait and mobility (R26.89);Muscle weakness (generalized) (M62.81)    Time: 1610-9604 PT Time Calculation (min) (ACUTE ONLY): 26 min   Charges:   PT Evaluation $PT Eval Moderate Complexity: 1 Mod PT Treatments $Therapeutic Activity: 23-37 mins PT General Charges $$ ACUTE PT VISIT: 1 Visit         Jasmin Winberry SPT [place], DPT Program

## 2023-02-24 NOTE — Consult Note (Addendum)
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 Cover with dry gauze and either silicone foam or ABD depending on amount of drainage.  May lift silicone foam daily to replace Medihoney.  Change silicone foam dressing q3 days and prn soiling.   Patient should remain on low air loss mattress throughout hospitalization to offload pressure.    POC discussed with bedside nurse and primary MD.  If aggressive treatment desired consider possible surgical consult for bedside debridement of necrotic tissue. Wound care will not follow at this time. Re-consult if further needs arise.   Thank you,     Priscella Mann MSN, RN-BC, [employer] [phone]

## 2023-02-24 NOTE — Progress Notes (Signed)
Fed patient breakfast. CBG before breakfast was 57. Patient ate about 25% of breakfast and drink 2 cups of juice with sugar added. Pt. Tolerated eating very well, I did assist with feeding. Pt. Is alert and can tell me his name and what year it is. Pt. Is hallucinating, talking to other "people" in room, when it is just me and him in room. Patient does have safety mitts on both hands due to fidgeting and pulling at devices and PICC line. Will continue to monitor and will recheck CBG.

## 2023-02-24 NOTE — TOC Progression Note (Addendum)
Transition of Care [facility]) - Progression Note    Patient Details  Name: Jim Ramirez MRN: [id number] Date of Birth: 01-13-1951  Transition of Care [facility] CM/SW Contact  Karn Cassis, [place] Phone Number: 02/24/2023, 1:15 PM  Clinical Narrative: LCSW followed up with family with bed offers. Unable to reach son, brother, or sister. Pt's niece, Shanda Bumps reports she or sister are primary decision makers. Shanda Bumps accepts bed offer at [facility] pending no restraints for 48 hours. She reports it is okay to notify [facility] pt won't be returning. Debbie notified. MD updated pt can d/c Wednesday if restraint free for 48 hours. CMA updated to start authorization.    Update: Discussed above with Harriett Sine. She is in agreement with [facility].  Expected Discharge Plan: Skilled Nursing Facility Barriers to Discharge: Continued Medical Work up  Expected Discharge Plan and Services In-house Referral: Clinical Social Work Discharge Planning Services: CM Consult Post Acute Care Choice: Skilled Nursing Facility Living arrangements for the past 2 months: Single Family Home                                       Social Determinants of Health (SDOH) Interventions SDOH Screenings   Food Insecurity: No Food Insecurity (02/22/2023)  Housing: Low Risk  (02/22/2023)  Transportation Needs: No Transportation Needs (02/22/2023)  Utilities: Not At Risk (02/22/2023)  Tobacco Use: Medium Risk (02/22/2023)    Readmission Risk Interventions     No data to display

## 2023-02-24 NOTE — Progress Notes (Signed)
PROGRESS NOTE     Jim Ramirez, is a 72 y.o. male, DOB - 03-Sep-1950, ZOX:[id number]  Admit date - 02/22/2023   Admitting Physician Jim Angello Mariea Clonts, MD  Outpatient Primary MD for the patient is Jim Peri, MD  LOS - 1  Chief Complaint  Patient presents with   Altered Mental Status      Brief Narrative:  72 y.o. 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 Lovell Ramirez on 01/20/2023 , as well as a diagnosis of left lower extremity DVT status post IVC filter placement on 01/23/2023 admitted on 02/22/2023 with persistent hypokalemia possible UTI and acute metabolic encephalopathy    -Assessment and Plan: 1)Acute Metabolic Encephalopathy---??  Etiology -CT without acute findings -No leukocytosis -Chest x-ray without acute findings -Ammonia is not elevated -?? 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 Lovell Ramirez on 01/20/2023  --He completed IV Ancef on 02/18/2023 and was transitioned to p.o.cefadroxil 1000 mg bid -c/n Cefadroxil 1000 mg --bid for at least 10 months (Ten Months)-- from 02/18/23 as per ID---on hold from 02/24/2023 as patient is being started on cefepime for UTI -Low  threshold for repeat lumbar imaging   3) Pseudomonas and Serratia marcescens UTI --- antibiotics as above #2 -Received Diflucan for yeast in urine   4)Left lower extremity DVT status post IVC filter placement on 01/23/2023 --- continue Eliquis   5)PAFib---EKG A-fib RBB heart rate in the 80s -TTE and TEE from June 2024 with EF of 65% without aortic stenosis -Continue amiodarone and metoprolol for rate control -Eliquis for stroke prophylaxis   6) severe hypokalemia/hypernatremia ---  -Magnesium WNL -Phosphorous WNL -Hold Lasix -Potassium normalized continue to hydrate in setting of hyponatremia   7)Generalized weakness/ambulatory dysfunction/recurrent falls in the setting of recent back surgery =--Patient was getting PT at [facility] - get PT eval   8)Decubitus Ulcer/PI--POA -Wound care consult requested   9)DM2-recent A1c 8.5 Use Novolog/Humalog Sliding scale insulin with Accu-Cheks/Fingersticks as ordered  -Episode of hypoglycemia overnight - Reduce Semglee to 10  units from 12 units nightly   10)GERD--continue Protonix   11)HLD--continue Crestor  Disposition: The patient is from: SNF              Anticipated d/c is to: SNF              Anticipated d/c date is: 1 day                Barriers:  Patient required mittens overnight from 02/23/2023 to 02/24/2023----[facility] unable to take patient until he has been off restraints for 48 hours which would be 02/26/2023  Code Status :  -  Code Status: DNR   Family Communication:  (patient is alert, awake and coherent)  -Pt's Niece, Jim Ramirez is Primary Contact  DVT Prophylaxis  :   -  SCDs  SCDs Start: 02/22/23 1301 Place TED hose Start: 02/22/23 1301 apixaban (ELIQUIS) tablet 5 mg   Lab Results  Component Value Date   PLT 137 (L) 02/24/2023    Inpatient Medications  Scheduled Meds:  amiodarone  200 mg Oral Daily   apixaban  5 mg Oral BID   cefadroxil  1,000 mg Oral BID   Chlorhexidine Gluconate  Cloth  6 each Topical Daily   escitalopram  10 mg Oral Daily   feeding supplement  237 mL Oral BID BM   ferrous sulfate  325 mg Oral Q breakfast   gabapentin  100 mg Oral BID   insulin aspart  0-15 Units Subcutaneous TID WC   insulin aspart  0-5 Units Subcutaneous QHS   insulin aspart  3 Units Subcutaneous TID WC   insulin glargine-yfgn  12 Units Subcutaneous QHS   leptospermum manuka honey  1 Application Topical Daily   metoprolol tartrate  12.5 mg Oral BID   nystatin-triamcinolone   Topical BID   pantoprazole  40 mg Oral Daily   risperiDONE  0.5 mg Oral QHS   rosuvastatin  20 mg Oral QHS   senna-docusate  2 tablet Oral QHS   sodium chloride flush  3 mL Intravenous Q12H   sodium chloride flush  3 mL Intravenous Q12H   tamsulosin  0.4 mg Oral Daily   Continuous Infusions:  0.45 % NaCl with KCl 20 mEq / L 125 mL/hr at 02/24/23 1451   sodium chloride     PRN Meds:.sodium chloride, acetaminophen **OR** acetaminophen, bisacodyl, ondansetron **OR** ondansetron (ZOFRAN) IV, polyethylene glycol, sodium chloride flush, traZODone   Anti-infectives (From admission, onward)    Start     Dose/Rate Route Frequency Ordered Stop   02/23/23 1000  cefadroxil (DURICEF) capsule 1,000 mg        1,000 mg Oral 2 times daily 02/23/23 0813     02/22/23 1830  fluconazole (DIFLUCAN) tablet 150 mg        150 mg Oral  Once 02/22/23 1805 02/22/23 1844   02/22/23 1800  cefadroxil (DURICEF) capsule 500 mg  Status:  Discontinued        500 mg Oral 2 times daily 02/22/23 1727 02/23/23 0813   02/22/23 1730  fluconazole (DIFLUCAN) tablet 200 mg  Status:  Discontinued        200 mg Oral Daily 02/22/23 1728 02/22/23 1805         Subjective: Jim Ramirez today has no fevers, no emesis,  No chest pain,   -Increasing confusion overnight required mittens -Cooperative today  Objective: Vitals:   02/24/23 1200 02/24/23 1420 02/24/23 1500 02/24/23 1552  BP: (!) 120/56 106/63 90/62 110/62  Pulse: 80 79 77  71  Resp: (!) 25 18  20   Temp:    98.2 F (36.8 C)  TempSrc:    Oral  SpO2: 100% 97%  98%  Weight:      Height:        Intake/Output Summary (Last 24 hours) at 02/24/2023 2020 Last data filed at 02/24/2023 2008 Gross per 24 hour  Intake 3314.41 ml  Output 1550 ml  Net 1764.41 ml   Filed Weights   02/22/23 0927 02/22/23 2011 02/24/23 0500  Weight: 88.5 kg 85.6 kg 85.1 kg    Physical Exam  Gen:- Awake Alert, in no acute distress, cooperative HEENT:- [place].AT, No sclera icterus Neck-Supple Neck,No JVD,.  Lungs-  CTAB , fair symmetrical air movement CV- S1, S2 normal, irregular  Abd-  +ve  B.Sounds, Abd Soft, No tenderness,    Extremity/Skin:-  pedal pulses present , decubitus ulcer/pressure injury on mid back--see photos in epic Psych-affect is appropriate, oriented x3, much less confused, less disoriented, no further psychosis/hallucinations at this time Neuro-generalized weakness, no new focal deficits, no tremors  Data Reviewed: I have personally reviewed following labs and imaging studies  CBC: Recent Labs  Lab 02/22/23 1038 02/23/23 0435 02/24/23 0405  WBC 10.5 6.1 5.3  NEUTROABS 7.6  --   --   HGB 8.6* 7.9* 8.0*  HCT 28.5* 26.5* 27.6*  MCV 96.0 96.0 98.9  PLT 125* 161 137*   Basic Metabolic Panel: Recent Labs  Lab 02/22/23 1038 02/22/23 1237 02/22/23 1548 02/23/23 0435 02/23/23 1440 02/24/23 0405  NA 144  --  147* 148* 145 146*  K 2.2*  --  2.4* 2.6* 3.9 4.4  CL 104  --  108 113* 113* 116*  CO2 30  --  29 30 27 28   GLUCOSE 112*  --  85 58* 100* 65*  BUN 14  --  12 10 9 8   CREATININE 0.99  --  0.93 0.89 0.80 0.76  CALCIUM 7.5*  --  7.5* 7.2* 7.2* 7.4*  MG  --  1.8  --   --   --   --   PHOS  --   --  2.7  --   --  1.8*   GFR: Estimated Creatinine Clearance: 94.3 mL/min (by C-G formula based on SCr of 0.76 mg/dL). Liver Function Tests: Recent Labs  Lab 02/22/23 1038 02/22/23 1548 02/24/23 0405  AST 28  --   --   ALT <5  --   --   ALKPHOS 93  --    --   BILITOT 0.9  --   --   PROT 6.0*  --   --   ALBUMIN 1.8* 1.7* 1.6*   Recent Results (from the past 240 hour(s))  SARS Coronavirus 2 by RT PCR (hospital order, performed in [facility] hospital lab) *cepheid single result test* Anterior Nasal Swab     Status: None   Collection Time: 02/22/23  9:56 AM   Specimen: Anterior Nasal Swab  Result Value Ref Range Status   SARS Coronavirus 2 by RT PCR NEGATIVE NEGATIVE Final    Comment: (NOTE) SARS-CoV-2 target nucleic acids are NOT DETECTED.  The SARS-CoV-2 RNA is generally detectable in upper and lower respiratory specimens during the acute phase of infection. The lowest concentration of SARS-CoV-2 viral copies this assay can detect is 250 copies / mL. A negative result does not preclude SARS-CoV-2 infection and should not be used as the sole basis for treatment or other patient management decisions.  A negative result may occur with improper specimen collection / handling, submission of specimen other than nasopharyngeal swab, presence of viral mutation(s) within the areas targeted by this assay, and inadequate number of viral copies (<250 copies / mL). A negative result must be combined with clinical observations, patient history, and epidemiological information.  Fact Sheet for Patients:   RoadLapTop.co.za  Fact Sheet for Healthcare Providers: http://kim-miller.com/  This test is not yet approved or  cleared by the [place] FDA and has been authorized for detection and/or diagnosis of SARS-CoV-2 by FDA under an Emergency Use Authorization (EUA).  This EUA will remain in effect (meaning this test can be used) for the duration of the COVID-19 declaration under Section 564(b)(1) of the Act, 21 U.S.C. section 360bbb-3(b)(1), unless the authorization is terminated or revoked sooner.  Performed at [facility], [address], [place], [place] [address]   Culture, blood (routine x  2)     Status: None (Preliminary result)   Collection Time: 02/22/23 10:18 AM   Specimen: BLOOD LEFT WRIST  Result Value Ref Range Status   Specimen Description   Final    BLOOD LEFT WRIST BOTTLES DRAWN AEROBIC AND ANAEROBIC   Special Requests Blood Culture adequate volume  Final   Culture   Final    NO GROWTH 2 DAYS Performed at [facility], [address] [place], [place] [address]    Report Status PENDING  Incomplete  Culture, blood (routine x 2)     Status: None (Preliminary result)   Collection Time: 02/22/23 10:30 AM   Specimen: BLOOD RIGHT WRIST  Result Value Ref Range Status   Specimen Description   Final    BLOOD RIGHT WRIST BOTTLES DRAWN AEROBIC AND ANAEROBIC   Special Requests Blood Culture adequate volume  Final   Culture   Final    NO GROWTH 2 DAYS Performed at [facility], [address], [place], [place] [address]    Report Status PENDING  Incomplete  Urine Culture (for pregnant, neutropenic or urologic patients or patients with an indwelling urinary catheter)     Status: Abnormal (Preliminary result)   Collection Time: 02/22/23 12:00 PM   Specimen: Urine, Clean Catch  Result Value Ref Range Status   Specimen Description   Final    URINE, CLEAN CATCH Performed at [facility], [address] [place], [place] [address]    Special Requests   Final    NONE Performed at [facility], [address] [place], [place] [address]    Culture (A)  Final    >=100,000 COLONIES/mL SERRATIA MARCESCENS >=100,000 COLONIES/mL PSEUDOMONAS AERUGINOSA CULTURE REINCUBATED FOR BETTER GROWTH Performed at [facility], [address] [place], [place] [address]    Report Status PENDING  Incomplete  MRSA Next Gen by PCR, Nasal     Status: None   Collection Time: 02/22/23  8:01 PM   Specimen: Nasal Mucosa; Nasal Swab  Result Value Ref Range Status   MRSA by PCR Next Gen NOT DETECTED NOT DETECTED Final    Comment: (NOTE) The GeneXpert MRSA Assay (FDA approved for NASAL  specimens only), is one component of a comprehensive MRSA colonization surveillance program. It is not intended to diagnose MRSA infection nor to guide or monitor treatment for MRSA infections. Test performance is not FDA approved in patients less than 25 years old. Performed at [facility], [address], [place], [place] [address]     Radiology Studies: No results found.  Scheduled Meds:  amiodarone  200 mg Oral Daily   apixaban  5 mg Oral BID   cefadroxil  1,000 mg Oral BID   Chlorhexidine Gluconate Cloth  6 each Topical Daily   escitalopram  10 mg Oral Daily   feeding supplement  237 mL Oral BID BM   ferrous sulfate  325 mg Oral Q breakfast   gabapentin  100 mg Oral BID   insulin aspart  0-15 Units Subcutaneous TID WC   insulin aspart  0-5 Units Subcutaneous QHS   insulin aspart  3 Units Subcutaneous TID WC   insulin glargine-yfgn  12 Units Subcutaneous QHS   leptospermum manuka honey  1 Application Topical Daily   metoprolol tartrate  12.5 mg Oral BID   nystatin-triamcinolone   Topical BID   pantoprazole  40 mg Oral Daily  risperiDONE  0.5 mg Oral QHS   rosuvastatin  20 mg Oral QHS   senna-docusate  2 tablet Oral QHS   sodium chloride flush  3 mL Intravenous Q12H   sodium chloride flush  3 mL Intravenous Q12H   tamsulosin  0.4 mg Oral Daily   Continuous Infusions:  0.45 % NaCl with KCl 20 mEq / L 125 mL/hr at 02/24/23 1451   sodium chloride      LOS: 1 day   Shon Hale M.D on 02/24/2023 at 8:20 PM  Go to [email or web address] - for contact info  [facility] - Office  [phone]  If 7PM-7AM, please contact night-coverage [email or web address] 02/24/2023, 8:20 PM

## 2023-02-24 NOTE — Inpatient Diabetes Management (Signed)
Inpatient Diabetes Program Recommendations  AACE/ADA: New Consensus Statement on Inpatient Glycemic Control   Target Ranges:  Prepandial:   less than 140 mg/dL      Peak postprandial:   less than 180 mg/dL (1-2 hours)      Critically ill patients:  140 - 180 mg/dL    Latest Reference Range & Units 02/23/23 07:37 02/23/23 08:20 02/23/23 11:32 02/23/23 15:33 02/23/23 22:20 02/24/23 07:27 02/24/23 09:09  Glucose-Capillary 70 - 99 mg/dL 49 (L) 130 (H) 96 88 865 (H)  Semglee 12 units  57 (L) 111 (H)   Review of Glycemic Control  Diabetes history: DM2 Outpatient Diabetes medications: Semglee 15 units at bedtime, Novolog 3 units TID with meals, Farxiga 10 mg daily Current orders for Inpatient glycemic control: Semglee 12 units at bedtime, Novolog 0-15 units TID with meals, Novolog 0-5 units at bedtime, Novolog 3 units TID with meals  Inpatient Diabetes Program Recommendations:    Insulin: Please consider decreasing Semglee to 8 units at bedtime and discontinuing Novolog 3 units TID (meal coverage).  Thanks, Orlando Penner, RN, MSN, CDCES Diabetes Coordinator Inpatient Diabetes Program [phone] (Team Pager from 8am to 5pm)

## 2023-02-24 NOTE — Plan of Care (Signed)
  Problem: Acute Rehab PT Goals(only PT should resolve) Goal: Pt Will Go Supine/Side To Sit Outcome: Progressing Flowsheets (Taken 02/24/2023 1137) Pt will go Supine/Side to Sit:  with minimal assist  with moderate assist Goal: Patient Will Transfer Sit To/From Stand Outcome: Progressing Flowsheets (Taken 02/24/2023 1137) Patient will transfer sit to/from stand:  with minimal assist  with moderate assist Goal: Pt Will Transfer Bed To Chair/Chair To Bed Outcome: Progressing Flowsheets (Taken 02/24/2023 1137) Pt will Transfer Bed to Chair/Chair to Bed:  with mod assist  with min assist Goal: Pt Will Ambulate Outcome: Progressing Flowsheets (Taken 02/24/2023 1137) Pt will Ambulate:  10 feet  with moderate assist   John Williamsen SPT [employer], DPT Program

## 2023-02-24 NOTE — Consult Note (Signed)
[facility] Consult  Reason for Consult: Back eschar  Referring Physician: Dr. Mariea Clonts   Chief Complaint   Altered Mental Status     HPI: Jim Ramirez is a 72 y.o. male with a recent history of bacteremia, lumbar epidural abscess, surgery with Dr. Lovell Sheehan 01/20/23, antibiotic completion who has been in a SNF since that time and saw Dr. Lovell Sheehan 7/2 with his lumbar incision healing well.  He was admitted with AMS and possible UTI. He is on Eliquis for A fib but also recent DVT. He has an eschar on his back that he cannot tell me how long it has been there.  The wound Rns recommended possible debridement after they were consulted. His sister and brother are at the bedside and his son is reported to not be answering his phone.   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   CTA negative for aneurysm   Depressed    Diabetes mellitus without complication (HCC)    DVT (deep venous thrombosis) (HCC)    History of kidney stones    Hypertension    Nephrolithiasis    Sleep apnea     Past Surgical History:  Procedure Laterality Date   ANTERIOR CERVICAL DECOMP/DISCECTOMY FUSION N/A 05/22/2022   Procedure: ANTERIOR CERVICAL DISCECTOMY FUSION, INTERBODY PROSTHESIS,PLATE/SCREWS CERVICAL THREE-FOUR, CERVICAL FOUR-FIVE;REMOVAL CERVICAL PLATE;  Surgeon: Tressie Stalker, MD;  Location: [facility] OR;  Service: Neurosurgery;  Laterality: N/A;  3C   CATARACT EXTRACTION W/PHACO Left 09/23/2022   Procedure: CATARACT EXTRACTION PHACO AND INTRAOCULAR LENS PLACEMENT (IOC);  Surgeon: Fabio Pierce, MD;  Location: [facility];  Service: Ophthalmology;  Laterality: Left;  CDE: 7.52   CATARACT EXTRACTION W/PHACO Right 10/07/2022   Procedure: CATARACT EXTRACTION PHACO AND INTRAOCULAR LENS PLACEMENT (IOC);  Surgeon: Fabio Pierce, MD;  Location: [facility];  Service: Ophthalmology;  Laterality: Right;  CDE: 9.40   IR IVC FILTER PLMT / S&I /IMG  GUID/MOD SED  01/19/2023   LUMBAR LAMINECTOMY/DECOMPRESSION MICRODISCECTOMY N/A 01/20/2023   Procedure: LUMBAR LAM FOR EPIDURAL ABSCESS, LUMBAR THREE-FOUR, LUMBAR FOUR-FIVE,  LUMBAR FIVE-SACRAL ONE;  Surgeon: Tressie Stalker, MD;  Location: [facility] OR;  Service: Neurosurgery;  Laterality: N/A;   STOMACH SURGERY     TEE WITHOUT CARDIOVERSION N/A 12/31/2022   Procedure: TRANSESOPHAGEAL ECHOCARDIOGRAM;  Surgeon: Little Ishikawa, MD;  Location: [facility] INVASIVE CV LAB;  Service: Cardiovascular;  Laterality: N/A;   TONSILLECTOMY  2000    Family History  Problem Relation Age of Onset   Heart attack Mother    Heart attack Father    Hypertension Brother    Diabetes Brother    Coronary artery disease Brother     Social History   Tobacco Use   Smoking status: Former    Current packs/day: 0.00    Types: Cigarettes    Quit date: 1994    Years since quitting: 30.5    Passive exposure: Never   Smokeless tobacco: Never  Vaping Use   Vaping status: Never Used  Substance Use Topics   Alcohol use: Not Currently   Drug use: Never    Medications: I have reviewed the patient's current medications. Prior to Admission:  Medications Prior to Admission  Medication Sig Dispense Refill Last Dose   amiodarone (PACERONE) 200 MG tablet Take 1 tablet (200 mg total) by mouth daily.   02/21/2023   apixaban (ELIQUIS) 5 MG TABS tablet Take 1 tablet (5 mg total) by mouth 2 (two) times daily. 60 tablet  02/21/2023 at 2200   azelastine (ASTELIN) 0.1 % nasal spray Place 1 spray into both nostrils 2 (two) times daily. Use in each nostril as directed   02/21/2023   cefadroxil (DURICEF) 500 MG capsule Take 2 capsules (1,000 mg total) by mouth 2 (two) times daily. Continue for long term (Patient taking differently: Take 500 mg by mouth 2 (two) times daily. Continue for long term)   02/21/2023   ceFAZolin (ANCEF) IVPB Inject 2 g into the vein every 8 (eight) hours. Indication:  Disseminated MSSA infection First Dose:  Yes Last Day of Therapy:  02/18/23 Labs - Once weekly:  CBC/D and BMP, Labs - Once weekly: ESR and CRP Method of administration: IV Push Start on 01/11/23 after completion of Nafcillin CI, pull PICC line after completion of IV antibiotic therapy Method of administration may be changed at the discretion of home infusion pharmacist based upon assessment of the patient and/or caregiver's ability to self-administer the medication ordered. 90 Units 0 02/18/2023   cyanocobalamin (VITAMIN B12) 1000 MCG/ML injection Inject 1,000 mcg into the muscle every 30 (thirty) days.   01/29/2023   dapagliflozin propanediol (FARXIGA) 10 MG TABS tablet Take 10 mg by mouth daily.   02/21/2023   docusate sodium (COLACE) 100 MG capsule Take 1 capsule (100 mg total) by mouth 2 (two) times daily. 10 capsule 0 02/21/2023   escitalopram (LEXAPRO) 10 MG tablet Take 10 mg by mouth daily.   02/21/2023   esomeprazole (NEXIUM) 40 MG capsule Take 40 mg by mouth daily.   02/21/2023   ferrous sulfate 325 (65 FE) MG tablet Take 1 tablet (325 mg total) by mouth daily with breakfast.  3 02/21/2023   gabapentin (NEURONTIN) 100 MG capsule Take 1 capsule (100 mg total) by mouth 2 (two) times daily.   02/21/2023   insulin aspart (NOVOLOG) 100 UNIT/ML injection Inject 3 Units into the skin 3 (three) times daily with meals. If eats more than 50% of meals 10 mL 11 02/19/2023   insulin glargine-yfgn (SEMGLEE) 100 UNIT/ML injection Inject 0.15 mLs (15 Units total) into the skin at bedtime. 10 mL 11 02/21/2023   levocetirizine (XYZAL) 5 MG tablet Take 5 mg by mouth every evening.   02/21/2023   metoprolol tartrate (LOPRESSOR) 25 MG tablet Take 0.5 tablets (12.5 mg total) by mouth 2 (two) times daily.   02/21/2023   nystatin (MYCOSTATIN/NYSTOP) powder Apply 1 Application topically daily. For 10 days   02/21/2023   omeprazole (PRILOSEC) 20 MG capsule Take 20 mg by mouth daily.   02/21/2023   oxyCODONE (OXY IR/ROXICODONE) 5 MG immediate release tablet Take 1  tablet (5 mg total) by mouth every 3 (three) hours as needed for moderate pain ((score 4 to 6)). 15 tablet 0 02/21/2023   potassium chloride SA (KLOR-CON M) 20 MEQ tablet Take 1 tablet (20 mEq total) by mouth daily.  0 02/21/2023   risperiDONE (RISPERDAL) 0.5 MG tablet Take 1 tablet (0.5 mg total) by mouth at bedtime.   02/21/2023   rosuvastatin (CRESTOR) 20 MG tablet Take 20 mg by mouth daily.   02/21/2023   tamsulosin (FLOMAX) 0.4 MG CAPS capsule Take 0.4 mg by mouth daily.   02/21/2023   torsemide (DEMADEX) 20 MG tablet Take 20 mg by mouth daily.   02/21/2023   diclofenac Sodium (VOLTAREN) 1 % GEL Apply 2 g topically 4 (four) times daily.      furosemide (LASIX) 20 MG tablet Take 1 tablet (20 mg total) by mouth daily. 30 tablet  Scheduled:  amiodarone  200 mg Oral Daily   apixaban  5 mg Oral BID   cefadroxil  1,000 mg Oral BID   Chlorhexidine Gluconate Cloth  6 each Topical Daily   escitalopram  10 mg Oral Daily   feeding supplement  237 mL Oral BID BM   ferrous sulfate  325 mg Oral Q breakfast   gabapentin  100 mg Oral BID   insulin aspart  0-15 Units Subcutaneous TID WC   insulin aspart  0-5 Units Subcutaneous QHS   insulin aspart  3 Units Subcutaneous TID WC   insulin glargine-yfgn  12 Units Subcutaneous QHS   leptospermum manuka honey  1 Application Topical Daily   metoprolol tartrate  12.5 mg Oral BID   pantoprazole  40 mg Oral Daily   risperiDONE  0.5 mg Oral QHS   rosuvastatin  20 mg Oral QHS   senna-docusate  2 tablet Oral QHS   sodium chloride flush  3 mL Intravenous Q12H   sodium chloride flush  3 mL Intravenous Q12H   tamsulosin  0.4 mg Oral Daily   Continuous:  0.45 % NaCl with KCl 20 mEq / L 125 mL/hr at 02/24/23 0538   sodium chloride     ZOX:WRUEAV chloride, acetaminophen **OR** acetaminophen, bisacodyl, ondansetron **OR** ondansetron (ZOFRAN) IV, polyethylene glycol, sodium chloride flush, traZODone  No Known Allergies   ROS: Oriented to self, hospital   Review of systems not obtained due to patient factors.  Blood pressure (!) 120/56, pulse 80, temperature 98.2 F (36.8 C), temperature source Axillary, resp. rate (!) 25, height 6' (1.829 m), weight 85.1 kg, SpO2 100%. Physical Exam HENT:     Head: Normocephalic.     Mouth/Throat:     Mouth: Mucous membranes are moist.  Cardiovascular:     Rate and Rhythm: Normal rate.  Pulmonary:     Effort: Pulmonary effort is normal.  Abdominal:     General: There is no distension.     Palpations: Abdomen is soft.  Musculoskeletal:        General: Swelling present.     Comments: Lumbar spine incision healed, moisture breakdown in the sacral region inferior with mepilex heart in place, 6cm X3cm eschar thoracic spine area with no boggy tissue underneath, no current separation or sloughing of the eschar, no purulence appreciated, mepilex dressing and gauze replaced by RN   Neurological:     Mental Status: He is alert.     Comments: Oriented to self and hospital, not to city or time/date  Psychiatric:        Mood and Affect: Mood normal.     Results: Results for orders placed or performed during the hospital encounter of 02/22/23 (from the past 48 hour(s))  Renal function panel     Status: Abnormal   Collection Time: 02/22/23  3:48 PM  Result Value Ref Range   Sodium 147 (H) 135 - 145 mmol/L   Potassium 2.4 (LL) 3.5 - 5.1 mmol/L    Comment: CRITICAL RESULT CALLED TO, READ BACK BY AND VERIFIED WITH CHARLOTTE RN @ 1636 ON [id number] BY HENDERSON L   Chloride 108 98 - 111 mmol/L   CO2 29 22 - 32 mmol/L   Glucose, Bld 85 70 - 99 mg/dL    Comment: Glucose reference range applies only to samples taken after fasting for at least 8 hours.   BUN 12 8 - 23 mg/dL   Creatinine, Ser 9.14 0.61 - 1.24 mg/dL   Calcium 7.5 (L) 8.9 -  10.3 mg/dL   Phosphorus 2.7 2.5 - 4.6 mg/dL   Albumin 1.7 (L) 3.5 - 5.0 g/dL   GFR, Estimated >24 >40 mL/min    Comment: (NOTE) Calculated using the CKD-EPI Creatinine Equation  (2021)    Anion gap 10 5 - 15    Comment: Performed at [facility], [address], [place], [place] [address]  CBG monitoring, ED     Status: Abnormal   Collection Time: 02/22/23  5:41 PM  Result Value Ref Range   Glucose-Capillary 61 (L) 70 - 99 mg/dL    Comment: Glucose reference range applies only to samples taken after fasting for at least 8 hours.  CBG monitoring, ED     Status: Abnormal   Collection Time: 02/22/23  6:32 PM  Result Value Ref Range   Glucose-Capillary 118 (H) 70 - 99 mg/dL    Comment: Glucose reference range applies only to samples taken after fasting for at least 8 hours.  MRSA Next Gen by PCR, Nasal     Status: None   Collection Time: 02/22/23  8:01 PM   Specimen: Nasal Mucosa; Nasal Swab  Result Value Ref Range   MRSA by PCR Next Gen NOT DETECTED NOT DETECTED    Comment: (NOTE) The GeneXpert MRSA Assay (FDA approved for NASAL specimens only), is one component of a comprehensive MRSA colonization surveillance program. It is not intended to diagnose MRSA infection nor to guide or monitor treatment for MRSA infections. Test performance is not FDA approved in patients less than 35 years old. Performed at [facility], [address], [place], [place] [address]   Glucose, capillary     Status: Abnormal   Collection Time: 02/22/23  8:08 PM  Result Value Ref Range   Glucose-Capillary 127 (H) 70 - 99 mg/dL    Comment: Glucose reference range applies only to samples taken after fasting for at least 8 hours.  Basic metabolic panel     Status: Abnormal   Collection Time: 02/23/23  4:35 AM  Result Value Ref Range   Sodium 148 (H) 135 - 145 mmol/L   Potassium 2.6 (LL) 3.5 - 5.1 mmol/L    Comment: CRITICAL RESULT CALLED TO, READ BACK BY AND VERIFIED WITH R ESTOCE AT 4034 ON [id number] BY S DALTON   Chloride 113 (H) 98 - 111 mmol/L   CO2 30 22 - 32 mmol/L   Glucose, Bld 58 (L) 70 - 99 mg/dL    Comment: Glucose reference range applies only to samples taken after fasting  for at least 8 hours.   BUN 10 8 - 23 mg/dL   Creatinine, Ser 8.75 0.61 - 1.24 mg/dL   Calcium 7.2 (L) 8.9 - 10.3 mg/dL   GFR, Estimated >64 >33 mL/min    Comment: (NOTE) Calculated using the CKD-EPI Creatinine Equation (2021)    Anion gap 5 5 - 15    Comment: Performed at [facility], [address] [place], [place] [address]  CBC     Status: Abnormal   Collection Time: 02/23/23  4:35 AM  Result Value Ref Range   WBC 6.1 4.0 - 10.5 K/uL   RBC 2.76 (L) 4.22 - 5.81 MIL/uL   Hemoglobin 7.9 (L) 13.0 - 17.0 g/dL   HCT 84.1 (L) 66.0 - 63.0 %   MCV 96.0 80.0 - 100.0 fL   MCH 28.6 26.0 - 34.0 pg   MCHC 29.8 (L) 30.0 - 36.0 g/dL   RDW 16.0 (H) 10.9 - 32.3 %   Platelets 161  150 - 400 K/uL   nRBC 0.0 0.0 - 0.2 %    Comment: Performed at [facility], [address], [place], [place] [address]  Glucose, capillary     Status: Abnormal   Collection Time: 02/23/23  7:37 AM  Result Value Ref Range   Glucose-Capillary 49 (L) 70 - 99 mg/dL    Comment: Glucose reference range applies only to samples taken after fasting for at least 8 hours.  Glucose, capillary     Status: Abnormal   Collection Time: 02/23/23  8:20 AM  Result Value Ref Range   Glucose-Capillary 118 (H) 70 - 99 mg/dL    Comment: Glucose reference range applies only to samples taken after fasting for at least 8 hours.  Glucose, capillary     Status: None   Collection Time: 02/23/23 11:32 AM  Result Value Ref Range   Glucose-Capillary 96 70 - 99 mg/dL    Comment: Glucose reference range applies only to samples taken after fasting for at least 8 hours.  Basic metabolic panel     Status: Abnormal   Collection Time: 02/23/23  2:40 PM  Result Value Ref Range   Sodium 145 135 - 145 mmol/L   Potassium 3.9 3.5 - 5.1 mmol/L   Chloride 113 (H) 98 - 111 mmol/L   CO2 27 22 - 32 mmol/L   Glucose, Bld 100 (H) 70 - 99 mg/dL    Comment: Glucose reference range applies only to samples taken after fasting for at least 8 hours.   BUN 9 8 -  23 mg/dL   Creatinine, Ser 6.04 0.61 - 1.24 mg/dL   Calcium 7.2 (L) 8.9 - 10.3 mg/dL   GFR, Estimated >54 >09 mL/min    Comment: (NOTE) Calculated using the CKD-EPI Creatinine Equation (2021)    Anion gap 5 5 - 15    Comment: Performed at [facility], [address], [place], [place] [address]  Glucose, capillary     Status: None   Collection Time: 02/23/23  3:33 PM  Result Value Ref Range   Glucose-Capillary 88 70 - 99 mg/dL    Comment: Glucose reference range applies only to samples taken after fasting for at least 8 hours.  Glucose, capillary     Status: Abnormal   Collection Time: 02/23/23 10:20 PM  Result Value Ref Range   Glucose-Capillary 125 (H) 70 - 99 mg/dL    Comment: Glucose reference range applies only to samples taken after fasting for at least 8 hours.  CBC     Status: Abnormal   Collection Time: 02/24/23  4:05 AM  Result Value Ref Range   WBC 5.3 4.0 - 10.5 K/uL   RBC 2.79 (L) 4.22 - 5.81 MIL/uL   Hemoglobin 8.0 (L) 13.0 - 17.0 g/dL   HCT 47.8 (L) 29.5 - 62.1 %   MCV 98.9 80.0 - 100.0 fL   MCH 28.7 26.0 - 34.0 pg   MCHC 29.0 (L) 30.0 - 36.0 g/dL   RDW 30.8 (H) 65.7 - 84.6 %   Platelets 137 (L) 150 - 400 K/uL   nRBC 0.0 0.0 - 0.2 %    Comment: Performed at [facility], [address], [place], [place] [address]  Renal function panel     Status: Abnormal   Collection Time: 02/24/23  4:05 AM  Result Value Ref Range   Sodium 146 (H) 135 - 145 mmol/L   Potassium 4.4 3.5 - 5.1 mmol/L   Chloride 116 (H) 98 - 111 mmol/L  CO2 28 22 - 32 mmol/L   Glucose, Bld 65 (L) 70 - 99 mg/dL    Comment: Glucose reference range applies only to samples taken after fasting for at least 8 hours.   BUN 8 8 - 23 mg/dL   Creatinine, Ser 1.61 0.61 - 1.24 mg/dL   Calcium 7.4 (L) 8.9 - 10.3 mg/dL   Phosphorus 1.8 (L) 2.5 - 4.6 mg/dL   Albumin 1.6 (L) 3.5 - 5.0 g/dL   GFR, Estimated >09 >60 mL/min    Comment: (NOTE) Calculated using the CKD-EPI Creatinine Equation (2021)    Anion  gap 2 (L) 5 - 15    Comment: ELECTROLYTES REPEATED TO VERIFY Performed at [facility], [address] [place], [place] [address]   Glucose, capillary     Status: Abnormal   Collection Time: 02/24/23  7:27 AM  Result Value Ref Range   Glucose-Capillary 57 (L) 70 - 99 mg/dL    Comment: Glucose reference range applies only to samples taken after fasting for at least 8 hours.   Comment 1 Notify RN    Comment 2 Document in Chart   Glucose, capillary     Status: Abnormal   Collection Time: 02/24/23  9:09 AM  Result Value Ref Range   Glucose-Capillary 111 (H) 70 - 99 mg/dL    Comment: Glucose reference range applies only to samples taken after fasting for at least 8 hours.  Glucose, capillary     Status: None   Collection Time: 02/24/23 11:15 AM  Result Value Ref Range   Glucose-Capillary 83 70 - 99 mg/dL    Comment: Glucose reference range applies only to samples taken after fasting for at least 8 hours.     Assessment & Plan:  KIAI PEACE is a 72 y.o. male with an unstagable thoracic back pressure ulcer. This is well away from Dr. Lovell Sheehan incision and has no obvious signs of purulence underneath and no major bogginess to suggest any large scale or deep tissue necrosis. Removing this eschar could help with dressing changes to be more effective and decrease the time to healing as now the eschar is a barrier preventing the medihoney or dressing from doing any debridement. I attempted to call the son and left a message.    We can do a local debridement once we can talk to the son but this is not something that needs to done emergently and can be done after speaking with him.   All questions were answered to the satisfaction of the patient and family (sister and brother) and could not get in touch with his son, left a message.     Lucretia Roers 02/24/2023, 1:05 PM

## 2023-02-24 NOTE — [place] FL2 (Signed)
[place] MEDICAID FL2 LEVEL OF CARE FORM     IDENTIFICATION  Patient Name: Jim Ramirez Birthdate: 02-11-1951 Sex: male Admission Date (Current Location): 02/22/2023  [facility] and [place] Number:  [employer] and Address:  [facility],  [address] [place] [address]      Provider Number: [id number]  Attending Physician Name and Address:  Shon Hale, MD  Relative Name and Phone Number:       Current Level of Care: Hospital Recommended Level of Care: Skilled Nursing Facility Prior Approval Number:    Date Approved/Denied:   PASRR Number: [id number]  Discharge Plan: SNF    Current Diagnoses: Patient Active Problem List   Diagnosis Date Noted   Hypokalemia 02/22/2023   Paroxysmal atrial fibrillation with RVR (HCC) 02/22/2023   Malnutrition of moderate degree 01/14/2023   MSSA bacteremia 12/26/2022   New onset atrial fibrillation (HCC) 12/24/2022   Discitis of lumbar region 12/24/2022   Sepsis (HCC) 12/24/2022   BPH (benign prostatic hyperplasia) 12/24/2022   Mood disorder (HCC) 12/24/2022   Cervical spondylosis with myelopathy and radiculopathy 05/22/2022   Diabetes mellitus type 2 in nonobese (HCC) 02/26/2008   Dyslipidemia 02/26/2008   Essential hypertension 02/26/2008   INTERNAL HEMORRHOIDS 02/26/2008   EXTERNAL HEMORRHOIDS 02/26/2008   Duodenal ulcer without hemorrhage or perforation and without obstruction 02/26/2008   GASTRITIS 02/26/2008   DUODENITIS 02/26/2008   MELENA 02/26/2008   NECK PAIN 02/26/2008   INSOMNIA 02/26/2008   SLEEP APNEA 02/26/2008   ABDOMINAL CRAMPS 02/26/2008   RECTAL BLEEDING, HX OF 02/26/2008    Orientation RESPIRATION BLADDER Height & Weight     Self, Place  Normal External catheter Weight: 187 lb 9.8 oz (85.1 kg) Height:  6' (182.9 cm)  BEHAVIORAL SYMPTOMS/MOOD NEUROLOGICAL BOWEL NUTRITION STATUS      Incontinent Diet (heart healthy)  AMBULATORY STATUS COMMUNICATION OF NEEDS Skin    Extensive Assist Verbally Skin abrasions, Bruising                       Personal Care Assistance Level of Assistance  Bathing, Dressing, Feeding Bathing Assistance: Maximum assistance Feeding assistance: Limited assistance Dressing Assistance: Maximum assistance     Functional Limitations Info  Sight, Hearing, Speech Sight Info: Impaired Hearing Info: Adequate Speech Info: Adequate    SPECIAL CARE FACTORS FREQUENCY  PT (By licensed PT), OT (By licensed OT)     PT Frequency: 5 times weekly OT Frequency: 5 times weekly            Contractures Contractures Info: Not present    Additional Factors Info  Code Status, Allergies Code Status Info: DNR Allergies Info: NKA           Current Medications (02/24/2023):  This is the current hospital active medication list Current Facility-Administered Medications  Medication Dose Route Frequency Provider Last Rate Last Admin   0.45 % NaCl with KCl 20 mEq / L infusion   Intravenous Continuous Mariea Clonts, Courage, MD 125 mL/hr at 02/24/23 0538 New Bag at 02/24/23 0538   0.9 %  sodium chloride infusion   Intravenous PRN Emokpae, Courage, MD       acetaminophen (TYLENOL) tablet 650 mg  650 mg Oral Q6H PRN Mariea Clonts, Courage, MD   650 mg at 02/23/23 1654   Or   acetaminophen (TYLENOL) suppository 650 mg  650 mg Rectal Q6H PRN Emokpae, Courage, MD       amiodarone (PACERONE) tablet 200 mg  200 mg Oral  Daily Mariea Clonts, Courage, MD   200 mg at 02/23/23 1136   apixaban (ELIQUIS) tablet 5 mg  5 mg Oral BID Shon Hale, MD   5 mg at 02/23/23 2111   bisacodyl (DULCOLAX) suppository 10 mg  10 mg Rectal Daily PRN Shon Hale, MD       cefadroxil (DURICEF) capsule 1,000 mg  1,000 mg Oral BID Mariea Clonts, Courage, MD   1,000 mg at 02/23/23 2110   Chlorhexidine Gluconate Cloth 2 % PADS 6 each  6 each Topical Daily Emokpae, Courage, MD   6 each at 02/23/23 1130   escitalopram (LEXAPRO) tablet 10 mg  10 mg Oral Daily Emokpae, Courage, MD   10  mg at 02/23/23 1136   feeding supplement (ENSURE ENLIVE / ENSURE PLUS) liquid 237 mL  237 mL Oral BID BM Emokpae, Courage, MD       ferrous sulfate tablet 325 mg  325 mg Oral Q breakfast Emokpae, Courage, MD   325 mg at 02/23/23 0741   gabapentin (NEURONTIN) capsule 100 mg  100 mg Oral BID Emokpae, Courage, MD   100 mg at 02/23/23 2111   insulin aspart (novoLOG) injection 0-15 Units  0-15 Units Subcutaneous TID WC Emokpae, Courage, MD       insulin aspart (novoLOG) injection 0-5 Units  0-5 Units Subcutaneous QHS Emokpae, Courage, MD       insulin aspart (novoLOG) injection 3 Units  3 Units Subcutaneous TID WC Emokpae, Courage, MD       insulin glargine-yfgn (SEMGLEE) injection 12 Units  12 Units Subcutaneous QHS Emokpae, Courage, MD   12 Units at 02/23/23 2223   leptospermum manuka honey (MEDIHONEY) paste 1 Application  1 Application Topical Daily Emokpae, Courage, MD       metoprolol tartrate (LOPRESSOR) tablet 12.5 mg  12.5 mg Oral BID Emokpae, Courage, MD   12.5 mg at 02/23/23 2110   ondansetron (ZOFRAN) tablet 4 mg  4 mg Oral Q6H PRN Emokpae, Courage, MD       Or   ondansetron (ZOFRAN) injection 4 mg  4 mg Intravenous Q6H PRN Emokpae, Courage, MD       pantoprazole (PROTONIX) EC tablet 40 mg  40 mg Oral Daily Emokpae, Courage, MD   40 mg at 02/23/23 2111   polyethylene glycol (MIRALAX / GLYCOLAX) packet 17 g  17 g Oral Daily PRN Emokpae, Courage, MD       risperiDONE (RISPERDAL) tablet 0.5 mg  0.5 mg Oral QHS Emokpae, Courage, MD   0.5 mg at 02/23/23 2111   rosuvastatin (CRESTOR) tablet 20 mg  20 mg Oral QHS Emokpae, Courage, MD   20 mg at 02/23/23 2111   senna-docusate (Senokot-S) tablet 2 tablet  2 tablet Oral QHS Emokpae, Courage, MD   2 tablet at 02/23/23 2111   sodium chloride flush (NS) 0.9 % injection 3 mL  3 mL Intravenous Q12H Emokpae, Courage, MD   3 mL at 02/23/23 1142   sodium chloride flush (NS) 0.9 % injection 3 mL  3 mL Intravenous Q12H Emokpae, Courage, MD   3 mL at 02/23/23  2226   sodium chloride flush (NS) 0.9 % injection 3 mL  3 mL Intravenous PRN Emokpae, Courage, MD       tamsulosin (FLOMAX) capsule 0.4 mg  0.4 mg Oral Daily Emokpae, Courage, MD   0.4 mg at 02/23/23 1655   traZODone (DESYREL) tablet 50 mg  50 mg Oral QHS PRN Shon Hale, MD   50 mg at 02/23/23 2110  Discharge Medications: Please see discharge summary for a list of discharge medications.  Relevant Imaging Results:  Relevant Lab Results:   Additional Information SSN: [id number]  [place], [place]

## 2023-02-25 DIAGNOSIS — L89102 Pressure ulcer of unspecified part of back, stage 2: Secondary | ICD-10-CM | POA: Diagnosis not present

## 2023-02-25 DIAGNOSIS — E876 Hypokalemia: Secondary | ICD-10-CM | POA: Diagnosis not present

## 2023-02-25 LAB — GLUCOSE, CAPILLARY
Glucose-Capillary: 41 mg/dL — CL (ref 70–99)
Glucose-Capillary: 84 mg/dL (ref 70–99)
Glucose-Capillary: 71 mg/dL (ref 70–99)
Glucose-Capillary: 93 mg/dL (ref 70–99)
Glucose-Capillary: 129 mg/dL — ABNORMAL HIGH (ref 70–99)

## 2023-02-25 MED ORDER — INSULIN GLARGINE-YFGN 100 UNIT/ML ~~LOC~~ SOLN
8.0000 [IU] | Freq: Every day | SUBCUTANEOUS | Status: DC
  Administered 2023-02-25: 8 [IU] via SUBCUTANEOUS
  Filled 2023-02-25 (×2): qty 0.08

## 2023-02-25 MED ORDER — PROSOURCE PLUS PO LIQD
30.0000 mL | Freq: Two times a day (BID) | ORAL | Status: DC
  Administered 2023-02-25 – 2023-02-26 (×2): 30 mL via ORAL
  Filled 2023-02-25 (×2): qty 30

## 2023-02-25 MED ORDER — ADULT MULTIVITAMIN W/MINERALS CH
1.0000 | ORAL_TABLET | Freq: Every day | ORAL | Status: DC
  Administered 2023-02-25 – 2023-02-26 (×2): 1 via ORAL
  Filled 2023-02-25 (×2): qty 1

## 2023-02-25 MED ORDER — ENSURE ENLIVE PO LIQD
237.0000 mL | Freq: Three times a day (TID) | ORAL | Status: DC
  Administered 2023-02-25 – 2023-02-26 (×3): 237 mL via ORAL

## 2023-02-25 MED ORDER — VITAMIN C 500 MG PO TABS
500.0000 mg | ORAL_TABLET | Freq: Two times a day (BID) | ORAL | Status: DC
  Administered 2023-02-25 – 2023-02-26 (×3): 500 mg via ORAL
  Filled 2023-02-25 (×3): qty 1

## 2023-02-25 MED ORDER — ZINC SULFATE 220 (50 ZN) MG PO CAPS
220.0000 mg | ORAL_CAPSULE | Freq: Every day | ORAL | Status: DC
  Administered 2023-02-25 – 2023-02-26 (×2): 220 mg via ORAL
  Filled 2023-02-25 (×2): qty 1

## 2023-02-25 NOTE — Progress Notes (Signed)
PROGRESS NOTE  Jim Ramirez, is a 72 y.o. male, DOB - March 30, 1951, NWG:[id number]  Admit date - 02/22/2023   Admitting Physician Tanishi Nault Mariea Clonts, MD  Outpatient Primary MD for the patient is [name], MD  LOS - 2  Chief Complaint  Patient presents with   Altered Mental Status      Brief Narrative:  72 y.o. 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 Lovell Sheehan on 01/20/2023 , as well as a diagnosis of left lower extremity DVT status post IVC filter placement on 01/23/2023 admitted on 02/22/2023 with persistent hypokalemia possible UTI and acute metabolic encephalopathy - --At discharge please restart  p.o.cefadroxil 1000 mg bid for 10 (Ten) Months for MSSA osteomyelitis/epidural abscess suppression     -Assessment and Plan: 1)Acute Metabolic Encephalopathy---due to Pseudomonas and Serratia UTI -CT without acute findings -No leukocytosis -Chest x-ray without acute findings -Ammonia is not elevated -?? 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 Lovell Sheehan on 01/20/2023  --He completed IV Ancef on 02/18/2023 and was transitioned to p.o.cefadroxil 1000 mg bid -c/n Cefadroxil 1000 mg  --bid for at least 10 months (Ten Months)-- from 02/18/23 as per ID---on hold from 02/24/2023 as patient is being started on cefepime for UTI -Low threshold for repeat lumbar imaging -At discharge please restart  p.o.cefadroxil 1000 mg bid for 10 (Ten) Months for MSSA osteomyelitis/epidural abscess suppression   3) Pseudomonas and Serratia marcescens UTI --- -Urine \ cultures from 02/22/2023 with Pseudomonas aeruginosa and Serratia marcescens  antibiotics as above #2 -Received Diflucan for yeast in urine -Sensitivities on Pseudomonas and Serratia are still pending   4)Left lower extremity DVT status post IVC filter placement on 01/23/2023 --- continue Eliquis   5)PAFib---EKG A-fib RBB heart rate in the 80s -TTE and TEE from June 2024 with EF of 65% without aortic stenosis -Continue amiodarone and metoprolol for rate control -Eliquis for stroke prophylaxis   6) severe hypokalemia/hypernatremia ---  -Magnesium WNL -Phosphorous WNL -Hold Lasix -Potassium normalized  -continue to hydrate in setting of hyponatremia   7)Generalized weakness/ambulatory dysfunction/recurrent falls in the setting of recent back surgery =--Patient was getting PT at [facility] -PT eval appreciated--- - awaiting transfer to [facility] rehab SNF pending insurance approval   8)Decubitus Ulcer/PI--mid back area---POA -Wound care recommendations appreciated -Status post bedside debridement by Dr. Henreitta Leber on 02/25/2023   9)DM2-recent A1c 8.5 Use Novolog/Humalog Sliding scale insulin with Accu-Cheks/Fingersticks as ordered  -Episode of hypoglycemia overnight - Reduce Semglee to 8  units from 15 units nightly -Encourage increased oral intake   10)GERD--continue Protonix   11)HLD--continue Crestor  Disposition: The patient is from: SNF              Anticipated d/c is to: SNF              Anticipated d/c date  is: 1 day               -Patient remains medically stable for discharge to SNF rehab pending insurance  authorization Barriers:  Patient required mittens overnight from 02/23/2023 to 02/24/2023---- -[facility] SNF facility unable to take patient until he has been off restraints for 48 hours which would be 02/26/2023  Code Status :  -  Code Status: DNR   Family Communication:  (patient is alert, awake and coherent)  -Pt's Niece, Shanda Bumps is Primary Contact  DVT Prophylaxis  :   - SCDs  SCDs Start: 02/22/23 1301 Place TED hose Start: 02/22/23 1301 apixaban (ELIQUIS) tablet 5 mg   Lab Results  Component Value Date   PLT 137 (L) 02/24/2023    Inpatient Medications  Scheduled Meds:  (feeding supplement) PROSource Plus  30 mL Oral BID BM   amiodarone  200 mg Oral Daily   apixaban  5 mg Oral BID   ascorbic acid  500 mg Oral BID   Chlorhexidine Gluconate Cloth  6 each Topical Daily   escitalopram  10 mg Oral Daily   feeding supplement  237 mL Oral TID BM   ferrous sulfate  325 mg Oral Q breakfast   gabapentin  100 mg Oral BID   insulin aspart  0-15 Units Subcutaneous TID WC   insulin aspart  0-5 Units Subcutaneous QHS   insulin aspart  3 Units Subcutaneous TID WC   insulin glargine-yfgn  10 Units Subcutaneous QHS   leptospermum manuka honey  1 Application Topical Daily   metoprolol tartrate  12.5 mg Oral BID   multivitamin with minerals  1 tablet Oral Daily   nystatin-triamcinolone   Topical BID   pantoprazole  40 mg Oral Daily   risperiDONE  0.5 mg Oral QHS   rosuvastatin  20 mg Oral QHS   senna-docusate  2 tablet Oral QHS   sodium chloride flush  3 mL Intravenous Q12H   sodium chloride flush  3 mL Intravenous Q12H   tamsulosin  0.4 mg Oral Daily   zinc sulfate  220 mg Oral Daily   Continuous Infusions:  0.45 % NaCl with KCl 20 mEq / L 125 mL/hr at 02/25/23 0816   sodium chloride     ceFEPime (MAXIPIME) IV 2 g (02/25/23 0933)   PRN Meds:.sodium chloride, acetaminophen **OR** acetaminophen, bisacodyl, ondansetron **OR** ondansetron (ZOFRAN) IV, polyethylene glycol, sodium  chloride flush, traZODone   Anti-infectives (From admission, onward)    Start     Dose/Rate Route Frequency Ordered Stop   02/24/23 2200  ceFEPIme (MAXIPIME) 2 g in sodium chloride 0.9 % 100 mL IVPB       Note to Pharmacy: 100,000 COLONIES/mL SERRATIA MARCESCENS  >=100,000 COLONIES/mL PSEUDOMONAS AERUGINOSA   2 g 200 mL/hr over 30 Minutes Intravenous Every 12 hours 02/24/23 2027     02/23/23 1000  cefadroxil (DURICEF) capsule 1,000 mg  Status:  Discontinued        1,000 mg Oral 2 times daily 02/23/23 0813 02/24/23 2027   02/22/23 1830  fluconazole (DIFLUCAN) tablet 150 mg        150 mg Oral  Once 02/22/23 1805 02/22/23 1844   02/22/23 1800  cefadroxil (DURICEF) capsule 500 mg  Status:  Discontinued        500 mg Oral 2 times daily 02/22/23 1727 02/23/23 0813   02/22/23 1730  fluconazole (DIFLUCAN) tablet 200 mg  Status:  Discontinued        200 mg Oral Daily  02/22/23 1728 02/22/23 1805       Subjective: [name] today has no fevers, no emesis,  No chest pain,   - Much less confused -Appetite is not great -Tolerated bedside wound debridement well by Dr. Henreitta Leber  Objective: Vitals:   02/24/23 1552 02/24/23 2054 02/24/23 2258 02/25/23 0350  BP: 110/62 (!) 124/95 115/60 123/65  Pulse: 71 80 88 73  Resp: 20 20 16 16   Temp: 98.2 F (36.8 C) 98.3 F (36.8 C) 98.2 F (36.8 C) 98 F (36.7 C)  TempSrc: Oral Oral    SpO2: 98% 96% 95% 95%  Weight:      Height:        Intake/Output Summary (Last 24 hours) at 02/25/2023 1334 Last data filed at 02/25/2023 1333 Gross per 24 hour  Intake 2704.23 ml  Output 1650 ml  Net 1054.23 ml   Filed Weights   02/22/23 0927 02/22/23 2011 02/24/23 0500  Weight: 88.5 kg 85.6 kg 85.1 kg    Physical Exam  Gen:- Awake Alert, in no acute distress, cooperative HEENT:- [place].AT, No sclera icterus Neck-Supple Neck,No JVD,.  Lungs-  CTAB , fair symmetrical air movement CV- S1, S2 normal, irregular  Abd-  +ve B.Sounds, Abd Soft, No  tenderness,    Extremity/Skin:-  pedal pulses present , decubitus ulcer/pressure injury on mid back--see photos in epic Psych-affect is appropriate, oriented x3, much less confused, less disoriented, no further psychosis/hallucinations at this time Neuro-generalized weakness, no new focal deficits, no tremors   Data Reviewed: I have personally reviewed following labs and imaging studies  CBC: Recent Labs  Lab 02/22/23 1038 02/23/23 0435 02/24/23 0405  WBC 10.5 6.1 5.3  NEUTROABS 7.6  --   --   HGB 8.6* 7.9* 8.0*  HCT 28.5* 26.5* 27.6*  MCV 96.0 96.0 98.9  PLT 125* 161 137*   Basic Metabolic Panel: Recent Labs  Lab 02/22/23 1038 02/22/23 1237 02/22/23 1548 02/23/23 0435 02/23/23 1440 02/24/23 0405  NA 144  --  147* 148* 145 146*  K 2.2*  --  2.4* 2.6* 3.9 4.4  CL 104  --  108 113* 113* 116*  CO2 30  --  29 30 27 28   GLUCOSE 112*  --  85 58* 100* 65*  BUN 14  --  12 10 9 8   CREATININE 0.99  --  0.93 0.89 0.80 0.76  CALCIUM 7.5*  --  7.5* 7.2* 7.2* 7.4*  MG  --  1.8  --   --   --   --   PHOS  --   --  2.7  --   --  1.8*   GFR: Estimated Creatinine Clearance: 94.3 mL/min (by C-G formula based on SCr of 0.76 mg/dL). Liver Function Tests: Recent Labs  Lab 02/22/23 1038 02/22/23 1548 02/24/23 0405  AST 28  --   --   ALT <5  --   --   ALKPHOS 93  --   --   BILITOT 0.9  --   --   PROT 6.0*  --   --   ALBUMIN 1.8* 1.7* 1.6*   Recent Results (from the past 240 hour(s))  SARS Coronavirus 2 by RT PCR (hospital order, performed in [facility] hospital lab) *cepheid single result test* Anterior Nasal Swab     Status: None   Collection Time: 02/22/23  9:56 AM   Specimen: Anterior Nasal Swab  Result Value Ref Range Status   SARS Coronavirus 2 by RT PCR NEGATIVE NEGATIVE Final  Comment: (NOTE) SARS-CoV-2 target nucleic acids are NOT DETECTED.  The SARS-CoV-2 RNA is generally detectable in upper and lower respiratory specimens during the acute phase of infection. The  lowest concentration of SARS-CoV-2 viral copies this assay can detect is 250 copies / mL. A negative result does not preclude SARS-CoV-2 infection and should not be used as the sole basis for treatment or other patient management decisions.  A negative result may occur with improper specimen collection / handling, submission of specimen other than nasopharyngeal swab, presence of viral mutation(s) within the areas targeted by this assay, and inadequate number of viral copies (<250 copies / mL). A negative result must be combined with clinical observations, patient history, and epidemiological information.  Fact Sheet for Patients:   RoadLapTop.co.za  Fact Sheet for Healthcare Providers: http://kim-miller.com/  This test is not yet approved or  cleared by the [place] FDA and has been authorized for detection and/or diagnosis of SARS-CoV-2 by FDA under an Emergency Use Authorization (EUA).  This EUA will remain in effect (meaning this test can be used) for the duration of the COVID-19 declaration under Section 564(b)(1) of the Act, 21 U.S.C. section 360bbb-3(b)(1), unless the authorization is terminated or revoked sooner.  Performed at [facility], [address] [place], [place] [address]   Culture, blood (routine x 2)     Status: None (Preliminary result)   Collection Time: 02/22/23 10:18 AM   Specimen: BLOOD LEFT WRIST  Result Value Ref Range Status   Specimen Description   Final    BLOOD LEFT WRIST BOTTLES DRAWN AEROBIC AND ANAEROBIC   Special Requests Blood Culture adequate volume  Final   Culture   Final    NO GROWTH 3 DAYS Performed at [facility], [address] [place], [place] [address]    Report Status PENDING  Incomplete  Culture, blood (routine x 2)     Status: None (Preliminary result)   Collection Time: 02/22/23 10:30 AM   Specimen: BLOOD RIGHT WRIST  Result Value Ref Range Status   Specimen Description    Final    BLOOD RIGHT WRIST BOTTLES DRAWN AEROBIC AND ANAEROBIC   Special Requests Blood Culture adequate volume  Final   Culture   Final    NO GROWTH 3 DAYS Performed at [facility], [address] [place], [place] [address]    Report Status PENDING  Incomplete  Urine Culture (for pregnant, neutropenic or urologic patients or patients with an indwelling urinary catheter)     Status: Abnormal (Preliminary result)   Collection Time: 02/22/23 12:00 PM   Specimen: Urine, Clean Catch  Result Value Ref Range Status   Specimen Description   Final    URINE, CLEAN CATCH Performed at [facility], [address] [place], [place] [address]    Special Requests   Final    NONE Performed at [facility], [address], [place], [place] [address]    Culture (A)  Final    >=100,000 COLONIES/mL SERRATIA MARCESCENS >=100,000 COLONIES/mL PSEUDOMONAS AERUGINOSA SUSCEPTIBILITIES TO FOLLOW Performed at [facility], [address], [place], [place] [address]    Report Status PENDING  Incomplete  MRSA Next Gen by PCR, Nasal     Status: None   Collection Time: 02/22/23  8:01 PM   Specimen: Nasal Mucosa; Nasal Swab  Result Value Ref Range Status   MRSA by PCR Next Gen NOT DETECTED NOT DETECTED Final    Comment: (NOTE) The GeneXpert MRSA Assay (FDA approved for NASAL  specimens only), is one component of a comprehensive MRSA colonization surveillance program. It is not intended to diagnose MRSA infection nor to guide or monitor treatment for MRSA infections. Test performance is not FDA approved in patients less than 61 years old. Performed at [facility], [address], [place], [place] [address]     Radiology Studies: No results found.  Scheduled Meds:  (feeding supplement) PROSource Plus  30 mL Oral BID BM   amiodarone  200 mg Oral Daily   apixaban  5 mg Oral BID   ascorbic acid  500 mg Oral BID   Chlorhexidine Gluconate Cloth  6 each Topical Daily   escitalopram  10 mg Oral Daily    feeding supplement  237 mL Oral TID BM   ferrous sulfate  325 mg Oral Q breakfast   gabapentin  100 mg Oral BID   insulin aspart  0-15 Units Subcutaneous TID WC   insulin aspart  0-5 Units Subcutaneous QHS   insulin aspart  3 Units Subcutaneous TID WC   insulin glargine-yfgn  10 Units Subcutaneous QHS   leptospermum manuka honey  1 Application Topical Daily   metoprolol tartrate  12.5 mg Oral BID   multivitamin with minerals  1 tablet Oral Daily   nystatin-triamcinolone   Topical BID   pantoprazole  40 mg Oral Daily   risperiDONE  0.5 mg Oral QHS   rosuvastatin  20 mg Oral QHS   senna-docusate  2 tablet Oral QHS   sodium chloride flush  3 mL Intravenous Q12H   sodium chloride flush  3 mL Intravenous Q12H   tamsulosin  0.4 mg Oral Daily   zinc sulfate  220 mg Oral Daily   Continuous Infusions:  0.45 % NaCl with KCl 20 mEq / L 125 mL/hr at 02/25/23 0816   sodium chloride     ceFEPime (MAXIPIME) IV 2 g (02/25/23 0933)    LOS: 2 days   Shon Hale M.D on 02/25/2023 at 1:34 PM  Go to [email or web address] - for contact info  [facility] - Office  [phone]  If 7PM-7AM, please contact night-coverage [email or web address] 02/25/2023, 1:34 PM

## 2023-02-25 NOTE — Plan of Care (Signed)
  Problem: Activity: Goal: Risk for activity intolerance will decrease Outcome: Progressing   Problem: Nutrition: Goal: Adequate nutrition will be maintained Outcome: Progressing   Problem: Coping: Goal: Level of anxiety will decrease Outcome: Progressing   

## 2023-02-25 NOTE — TOC Progression Note (Addendum)
Transition of Care [facility]) - Progression Note    Patient Details  Name: Jim Ramirez MRN: [id number] Date of Birth: July 26, 1951  Transition of Care [facility]) CM/SW Contact  Karn Cassis, [place] Phone Number: 02/25/2023, 10:00 AM  Clinical Narrative:  [facility] updated [place] Standard with [facility]. Anticipate d/c tomorrow when pt is 48 hours restraint free. Auth approved 7/30 - 8/11, next review 8/12,certification # [id number].       Expected Discharge Plan: Skilled Nursing Facility Barriers to Discharge: Requiring sitter/restraints (needs to be 48 hours restraint free)  Expected Discharge Plan and Services In-house Referral: Clinical Social Work Discharge Planning Services: CM Consult Post Acute Care Choice: Skilled Nursing Facility Living arrangements for the past 2 months: Single Family Home                                       Social Determinants of Health (SDOH) Interventions SDOH Screenings   Food Insecurity: No Food Insecurity (02/22/2023)  Housing: Low Risk  (02/22/2023)  Transportation Needs: No Transportation Needs (02/22/2023)  Utilities: Not At Risk (02/22/2023)  Tobacco Use: Medium Risk (02/22/2023)    Readmission Risk Interventions     No data to display

## 2023-02-25 NOTE — Procedures (Addendum)
[facility] Procedure Note  02/25/23  Pre-procedure Diagnosis: Unstageable thoracic pressure ulcer   Post-procedure Diagnosis: Stage II thoracic pressure ulcer   Procedure(s) Performed: Lambert Mody Excisional debridement of pressure ulcer eschar (8X4cm debridement)    Surgeon: Leatrice Jewels. Henreitta Leber, MD   Assistants: Fourth-year medical student, Dominica Severin, MS4   Anesthesia: None    Specimens: None   Estimated Blood Loss: Minimal  Wound Class: Stage II pressure ulcer   Procedure Indications: Eschar with Necrosed tissue  Findings: Healthy fatty tissue beneath eschar  Pre-excision   Post excision      Procedure: The procedure was done at bedside and the patient was placed on his side. The thoracic wound was prepared and draped in the usual sterile fashion. Sharp excisional debridement of the eschar down to the healthy fatty tissue was achieved using a scalpel. There was minimal skin bleeding. Metahoney was applied to the area followed by gauze packing and dressing.  Final inspection revealed acceptable hemostasis. The patient tolerated the procedure well.   Algis Greenhouse, MD [facility] [address] [place], [place] [address] [phone] (office)

## 2023-02-25 NOTE — Consult Note (Addendum)
WOC Nurse wound follow up; patient with Unstageable Pressure Injury to vertebral column debrided by surgery today  Wound type: 1.  Unstageable Pressure Injury vertebral column  2.  Deep Tissue Pressure Injury L lateral heel that has evolved to full thickness  Measurement:1.  Post debridement Unstageable Pressure Injury 5 cm x 4 cm x 1.5 cm 75% soft yellow slough 25% pink moist  2.  L lateral heel 4 cm x 4 cm deep tissue pressure injury that has evolved to full thickness 75% pink moist 25% yellow   Drainage (amount, consistency, odor) serosanguinous to L heel and mid vertebral  Periwound:skin to left heel with white maceration, intact to vertebral column  Dressing procedure/placement/frequency: 1.  Clean vertebral column wound with Vashe Wound Cleanser [place] [phone]) apply Medihoney to wound bed daily, fill wound with dry gauze, then  cover with dry gauze and silicone foam.  May lift foam to reapply Medihoney. Change foam dressing q3 days and prn soiling.  2.  Clean L lateral heel wound with Vashe wound cleanser, apply Xeroform to wound bed daily, cover with dry gauze, place in silicone foam heel protectors and place bilateral feet in Prevalon boots [place] [phone]).    Buttocks noted to have appearance of yeast, dark dusky red with what appears to be satellite lesions.  Mycolog already ordered by MD.    Patient ordered a low air loss mattress for pressure redistribution and moisture management.  Prevalon boots ordered to offload pressure to heels.   POC discussed with bedside nurse and primary MD.  WOC team will not follow at this time. Re-consult if further needs arise.   Thank you,    Priscella Mann MSN, RN-BC, [employer] [phone]

## 2023-02-25 NOTE — Progress Notes (Addendum)
[facility]  Discussed bedside debridement with son Minerva Areola on the phone. Discussed risk of bleeding, infection, getting the eschar gone to help with dressing and healing. He gave consent and form filled out with RN.  Patient tolerated procedure well, see separate note.   Would continue with wound RN medihoney and dressing changes.   Algis Greenhouse, MD [facility] [address] [place], [place] [address] [phone] (office)

## 2023-02-25 NOTE — Inpatient Diabetes Management (Signed)
Inpatient Diabetes Program Recommendations  AACE/ADA: New Consensus Statement on Inpatient Glycemic Control   Target Ranges:  Prepandial:   less than 140 mg/dL      Peak postprandial:   less than 180 mg/dL (1-2 hours)      Critically ill patients:  140 - 180 mg/dL    Latest Reference Range & Units 02/24/23 07:27 02/24/23 09:09 02/24/23 11:15 02/24/23 16:47 02/24/23 20:58  Glucose-Capillary 70 - 99 mg/dL 57 (L) 413 (H) 83 244 (H) 94    Latest Reference Range & Units 02/25/23 07:32 02/25/23 07:53  Glucose-Capillary 70 - 99 mg/dL 41 (LL) 84   Review of Glycemic Control  Diabetes history: DM2 Outpatient Diabetes medications: Semglee 15 units at bedtime, Novolog 3 units TID with meals, Farxiga 10 mg daily Current orders for Inpatient glycemic control: Semglee 10 units at bedtime, Novolog 0-15 units TID with meals, Novolog 0-5 units at bedtime, Novolog 3 units TID with meals   Inpatient Diabetes Program Recommendations:     Insulin:  CBG 41 mg/dl today.  Please consider decreasing Semglee to 4 units at bedtime, decreasing Novolog correction to 0-9 units TID with meals, and discontinuing Novolog 3 units TID (meal coverage).

## 2023-02-25 NOTE — Consult Note (Signed)
WOC consult requested for "wound;" this was already performed on 7/29. Please refer to previous progress notes and topical treatment orders have been provided for bedside nurses to perform.  Please re-consult if further assistance is needed.  Thank-you,  Cammie Mcgee MSN, RN, CWOCN, [place], CNS [phone]

## 2023-02-25 NOTE — Progress Notes (Signed)
Initial Nutrition Assessment  DOCUMENTATION CODES:   Not applicable  INTERVENTION:   -Feeding assistance with meals -500 mg vitamin C BID -220 mg zinc sulfate x 14 days -MVI with minerals daily -Ensure Enlive po TID, each supplement provides 350 kcal and 20 grams of protein.  -30 ml Prosource Plus BID, each supplement provides 100 kcals and 15 grams protein -Magic cup TID with meals, each supplement provides 290 kcal and 9 grams of protein  -Liberalize diet to regular for wider variety of meal selections and in attempt to minimize hypoglycemic episodes  NUTRITION DIAGNOSIS:   Increased nutrient needs related to wound healing as evidenced by estimated needs.  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    Reviewed I/O's: -190 ml x 24 hours and +3.4 L since admission  UOP: 550 ml x 24 hours  Pt unavailable at time of visit. Attempted to speak with pt via call to hospital room phone, however, unable to reach. RD unable to obtain further nutrition-related history or complete nutrition-focused physical exam at this time.    Per [facility] notes, pt with unstageable pressure injury on vertebral column. General surgery has evaluated; can do local debridement of wound after  speaking with family, however, this is not emergent.   Pt currently on a heart healthy, carb modified diet. Noted meal completions 10-75%. Per RN notes, pt consuming about 25% of meals and also drinking orange juice with sugar packets mixed in secondary to hypoglycemic episodes.   Reviewed wt hx; pt has experienced a 10.7% wt loss over the past month, which is significant for time frame. Highly suspect pt with malnutrition, however, unable to identify at this time. Pt also with moderate edema per nursing assessment; suspect that this may be masking further weight loss as well as fat and muscle depletions. Pt would greatly benefit from addition of oral nutrition supplements.   Medications reviewed and include ferrous sulfate and 0.45% NaCl with KCl 20 mEq/L infusion @ 125 ml/hr.   Pt with poor oral intake and would benefit from nutrient dense supplement. One Ensure Enlive supplement provides 350 kcals, 20 grams protein, and 44-45 grams of carbohydrate vs one Glucerna shake supplement, which provides 220 kcals, 10 grams of protein, and 26 grams of carbohydrate. Given pt's hx of DM, RD will reassess adequacy of PO intake, CBGS, and adjust supplement regimen as appropriate at follow-up.    Per TOC notes, pt from [facility]. Pt will require insurance authorization for SNF at d/c. Pt stable to return to SNF once he has been restraint free for 48 hours (likely 02/26/23).   Lab Results  Component Value Date   HGBA1C 8.5 (H) 12/24/2022   PTA DM medications are 15 units insulin glargine-yfgn daily and 3 units insulin aspart TID with meals (if pt consumes >50% of meal).   Labs  reviewed: Na: 146, Phos: 1.8, CBGS: 41-100 (inpatient orders for glycemic control are 0-15 units insulin aspart TID with meal,s 0-5 units insulin aspart daily at bedtime, 3 units insulin aspart TID with meals, and 10 units insulin glargine-yfgn daily).    Diet Order:   Diet Order             Diet heart healthy/carb  modified Room service appropriate? Yes; Fluid consistency: Thin  Diet effective now                   EDUCATION NEEDS:   Education needs have been addressed  Skin:  Skin Assessment: Skin Integrity Issues: Skin Integrity Issues:: Stage II, Unstageable Stage II: sacrum Unstageable: vertebral column  Last BM:  02/25/23 (type 6)  Height:   Ht Readings from Last 1 Encounters:  02/22/23 6' (1.829 m)    Weight:   Wt Readings from Last 1 Encounters:  02/24/23 85.1 kg    Ideal Body Weight:  80.9 kg  BMI:  Body mass index is 25.44 kg/m.  Estimated Nutritional Needs:   Kcal:  2200-2400  Protein:  120-135 grams  Fluid:  > 2 L    Levada Schilling, RD, LDN, CDCES Registered Dietitian II Certified Diabetes Care and Education Specialist Please refer to [facility] for RD and/or RD on-call/weekend/after hours pager

## 2023-02-25 NOTE — NC FL2 (Signed)
[place] MEDICAID FL2 LEVEL OF CARE FORM     IDENTIFICATION  Patient Name: Jim Ramirez Birthdate: [birth year] Sex: male Admission Date (Current Location): 02/22/2023  [facility] and [place] Number:  [employer] and Address:  [facility],  [address] [place] [address]      Provider Number: [id number]  Attending Physician Name and Address:  Shon Hale, MD  Relative Name and Phone Number:       Current Level of Care: Hospital Recommended Level of Care: Skilled Nursing Facility Prior Approval Number:    Date Approved/Denied:   PASRR Number: [id number]  Discharge Plan: SNF    Current Diagnoses: Patient Active Problem List   Diagnosis Date Noted   Pressure injury of thoracic region of back 02/24/2023   Hypokalemia 02/22/2023   Paroxysmal atrial fibrillation with RVR (HCC) 02/22/2023   Malnutrition of moderate degree 01/14/2023   MSSA bacteremia 12/26/2022   New onset atrial fibrillation (HCC) 12/24/2022   Discitis of lumbar region 12/24/2022   Sepsis (HCC) 12/24/2022   BPH (benign prostatic hyperplasia) 12/24/2022   Mood disorder (HCC) 12/24/2022   Cervical spondylosis with myelopathy and radiculopathy 05/22/2022   Diabetes mellitus type 2 in nonobese (HCC) 02/26/2008   Dyslipidemia 02/26/2008   Essential hypertension 02/26/2008   INTERNAL HEMORRHOIDS 02/26/2008   EXTERNAL HEMORRHOIDS 02/26/2008   Duodenal ulcer without hemorrhage or perforation and without obstruction 02/26/2008   GASTRITIS 02/26/2008   DUODENITIS 02/26/2008   MELENA 02/26/2008   NECK PAIN 02/26/2008   INSOMNIA 02/26/2008   SLEEP APNEA 02/26/2008   ABDOMINAL CRAMPS 02/26/2008   RECTAL BLEEDING, HX OF 02/26/2008    Orientation RESPIRATION BLADDER Height & Weight     Self, Place  Normal External catheter Weight: 187 lb 9.8 oz (85.1 kg) Height:  6' (182.9 cm)  BEHAVIORAL SYMPTOMS/MOOD NEUROLOGICAL BOWEL NUTRITION STATUS      Incontinent Diet (heart  healthy)  AMBULATORY STATUS COMMUNICATION OF NEEDS Skin   Extensive Assist Verbally Other (Comment), Skin abrasions, Bruising (stage II to sacrum with foam dressing. Unstageable to back with foam dressing.)                       Personal Care Assistance Level of Assistance  Bathing, Dressing, Feeding Bathing Assistance: Maximum assistance Feeding assistance: Limited assistance Dressing Assistance: Maximum assistance     Functional Limitations Info  Sight, Hearing, Speech Sight Info: Impaired Hearing Info: Adequate Speech Info: Adequate    SPECIAL CARE FACTORS FREQUENCY  PT (By licensed PT), OT (By licensed OT)     PT Frequency: 5 times weekly OT Frequency: 5 times weekly            Contractures Contractures Info: Not present    Additional Factors Info  Code Status, Allergies Code Status Info: DNR Allergies Info: NKA           Current Medications (02/25/2023):  This is the current hospital active medication list Current Facility-Administered Medications  Medication Dose Route Frequency Provider Last Rate Last Admin   0.45 % NaCl with KCl 20 mEq / L infusion   Intravenous Continuous Mariea Clonts, Courage, MD 125 mL/hr at 02/25/23 0816 New Bag at 02/25/23 0816   0.9 %  sodium chloride infusion   Intravenous PRN Emokpae, Courage, MD       acetaminophen (TYLENOL) tablet 650 mg  650 mg Oral Q6H PRN Shon Hale, MD   650 mg at 02/23/23 1654   Or   acetaminophen (TYLENOL) suppository 650  mg  650 mg Rectal Q6H PRN Shon Hale, MD       amiodarone (PACERONE) tablet 200 mg  200 mg Oral Daily Emokpae, Courage, MD   200 mg at 02/25/23 1610   apixaban (ELIQUIS) tablet 5 mg  5 mg Oral BID Shon Hale, MD   5 mg at 02/25/23 9604   bisacodyl (DULCOLAX) suppository 10 mg  10 mg Rectal Daily PRN Emokpae, Courage, MD       ceFEPIme (MAXIPIME) 2 g in sodium chloride 0.9 % 100 mL IVPB  2 g Intravenous Q12H Emokpae, Courage, MD 200 mL/hr at 02/25/23 0933 2 g at 02/25/23 0933    Chlorhexidine Gluconate Cloth 2 % PADS 6 each  6 each Topical Daily Emokpae, Courage, MD   6 each at 02/25/23 0928   escitalopram (LEXAPRO) tablet 10 mg  10 mg Oral Daily Emokpae, Courage, MD   10 mg at 02/25/23 5409   feeding supplement (ENSURE ENLIVE / ENSURE PLUS) liquid 237 mL  237 mL Oral BID BM Emokpae, Courage, MD   237 mL at 02/25/23 0928   ferrous sulfate tablet 325 mg  325 mg Oral Q breakfast Mariea Clonts, Courage, MD   325 mg at 02/25/23 0927   gabapentin (NEURONTIN) capsule 100 mg  100 mg Oral BID Emokpae, Courage, MD   100 mg at 02/25/23 0927   insulin aspart (novoLOG) injection 0-15 Units  0-15 Units Subcutaneous TID WC Emokpae, Courage, MD       insulin aspart (novoLOG) injection 0-5 Units  0-5 Units Subcutaneous QHS Emokpae, Courage, MD       insulin aspart (novoLOG) injection 3 Units  3 Units Subcutaneous TID WC Emokpae, Courage, MD       insulin glargine-yfgn (SEMGLEE) injection 10 Units  10 Units Subcutaneous QHS Shon Hale, MD   10 Units at 02/24/23 2127   leptospermum manuka honey (MEDIHONEY) paste 1 Application  1 Application Topical Daily Shon Hale, MD   1 Application at 02/25/23 0928   metoprolol tartrate (LOPRESSOR) tablet 12.5 mg  12.5 mg Oral BID Shon Hale, MD   12.5 mg at 02/25/23 8119   nystatin-triamcinolone (MYCOLOG II) cream   Topical BID Shon Hale, MD   Given at 02/25/23 0928   ondansetron (ZOFRAN) tablet 4 mg  4 mg Oral Q6H PRN Emokpae, Courage, MD       Or   ondansetron (ZOFRAN) injection 4 mg  4 mg Intravenous Q6H PRN Emokpae, Courage, MD       pantoprazole (PROTONIX) EC tablet 40 mg  40 mg Oral Daily Emokpae, Courage, MD   40 mg at 02/24/23 2128   polyethylene glycol (MIRALAX / GLYCOLAX) packet 17 g  17 g Oral Daily PRN Emokpae, Courage, MD       risperiDONE (RISPERDAL) tablet 0.5 mg  0.5 mg Oral QHS Emokpae, Courage, MD   0.5 mg at 02/24/23 2128   rosuvastatin (CRESTOR) tablet 20 mg  20 mg Oral QHS Emokpae, Courage, MD   20 mg at  02/24/23 2128   senna-docusate (Senokot-S) tablet 2 tablet  2 tablet Oral QHS Emokpae, Courage, MD   2 tablet at 02/24/23 2127   sodium chloride flush (NS) 0.9 % injection 3 mL  3 mL Intravenous Q12H Emokpae, Courage, MD   3 mL at 02/25/23 0929   sodium chloride flush (NS) 0.9 % injection 3 mL  3 mL Intravenous Q12H Emokpae, Courage, MD   3 mL at 02/25/23 0928   sodium chloride flush (NS) 0.9 % injection 3  mL  3 mL Intravenous PRN Emokpae, Courage, MD       tamsulosin (FLOMAX) capsule 0.4 mg  0.4 mg Oral Daily Emokpae, Courage, MD   0.4 mg at 02/24/23 1904   traZODone (DESYREL) tablet 50 mg  50 mg Oral QHS PRN Shon Hale, MD   50 mg at 02/24/23 2128     Discharge Medications: Please see discharge summary for a list of discharge medications.  Relevant Imaging Results:  Relevant Lab Results:   Additional Information SSN: [id number]  Karn [place], [place]

## 2023-02-25 NOTE — Progress Notes (Signed)
Physical Therapy Treatment Patient Details Name: Jim Ramirez MRN: [id number] DOB: [birth year] Today's Date: 02/25/2023   History of Present Illness Jim Ramirez  is a 72 y.o. 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 Lovell Sheehan on 01/20/2023 , as well as a diagnosis of left lower extremity DVT status post IVC filter placement on 01/23/2023 presenting from [facility] rehab with recurrent falls and generalized weakness and altered mentation with psychosis/hallucinations    PT Comments  Patient demonstrates slow labored movement for sitting up at bedside, frequent posterior right lateral leaning once seated at bedside, very unsteady on feet and limited to a few side steps at bedside before having to sit due to weakness and poor standing balance.  Patient able to stand for up to 4-5 minutes before having to flex trunk to lean over walker and buckling of knees.  Patient tolerated sitting up in chair after therapy - nursing staff aware.  Patient will benefit from continued skilled physical therapy in hospital and recommended venue below to increase strength, balance, endurance for safe ADLs and gait.       If plan is discharge home, recommend the following: A lot of help with walking and/or transfers;A lot of help with bathing/dressing/bathroom;Assistance with cooking/housework;Assist for transportation   Can travel by private vehicle     No  Equipment Recommendations  None recommended by PT    Recommendations for Other Services       Precautions / Restrictions Precautions Precautions: Fall Restrictions Weight Bearing Restrictions: No      Mobility  Bed Mobility Overal bed mobility: Needs Assistance Bed Mobility: Rolling, Sidelying to Sit Rolling: Mod assist Sidelying to sit: Mod assist, HOB elevated       General bed mobility comments: increased time, labored movement with HOB partially raised    Transfers Overall transfer level: Needs assistance Equipment used: Rolling walker (2 wheels) Transfers: Sit to/from Stand, Bed to chair/wheelchair/BSC Sit to Stand: Mod assist   Step pivot transfers: Mod assist       General transfer comment: unsteady labored shaky movement with difficulty extending trunk due to weakness    Ambulation/Gait Ambulation/Gait assistance: Mod assist, Max assist Gait Distance (Feet): 4 Feet Assistive device: Rolling walker (2 wheels) Gait Pattern/deviations: Step-to pattern, Decreased step length - right, Decreased step length - left, Decreased stride length, Trunk flexed Gait velocity: slow     General Gait Details: limited to a few slow unsteady labored side steps before having to sit due to BLE weakness and having to lean over RW for support once fatigued   Stairs             Wheelchair Mobility     Tilt Bed    Modified Rankin (Stroke Patients Only)       Balance Overall balance assessment: Needs assistance Sitting-balance support: Feet supported, No upper extremity supported Sitting balance-Leahy Scale: Fair Sitting balance - Comments: seated at EOB   Standing balance support: Bilateral upper extremity supported, During functional activity, Reliant on assistive device for balance Standing balance-Leahy Scale: Poor Standing balance comment: using RW  Cognition Arousal/Alertness: Awake/alert Behavior During Therapy: WFL for tasks assessed/performed Overall Cognitive Status: Within Functional Limits for tasks assessed                                          Exercises General Exercises - Lower  Extremity Long Arc Quad: Seated, AROM, Strengthening, Both, 10 reps Hip Flexion/Marching: Seated, AROM, Strengthening, Both, 10 reps Toe Raises: Seated, AROM, Strengthening, Both, 10 reps Heel Raises: Seated, AROM, Strengthening, Both, 10 reps    General Comments        Pertinent Vitals/Pain Pain Assessment Pain Assessment: Faces Faces Pain Scale: Hurts a little bit Pain Location: back pain Pain Descriptors / Indicators: Grimacing, Discomfort Pain Intervention(s): Limited activity within patient's tolerance, Monitored during session, Repositioned    Home Living                          Prior Function            PT Goals (current goals can now be found in the care plan section) Acute Rehab PT Goals Patient Stated Goal: Return home with assist of family PT Goal Formulation: With patient Time For Goal Achievement: 03/10/23 Potential to Achieve Goals: Good Progress towards PT goals: Progressing toward goals    Frequency    Min 3X/week      PT Plan Current plan remains appropriate    Co-evaluation              AM-PAC PT "6 Clicks" Mobility   Outcome Measure  Help needed turning from your back to your side while in a flat bed without using bedrails?: A Lot Help needed moving from lying on your back to sitting on the side of a flat bed without using bedrails?: A Lot Help needed moving to and from a bed to a chair (including a wheelchair)?: A Lot Help needed standing up from a chair using your arms (e.g., wheelchair or bedside chair)?: A Lot Help needed to walk in hospital room?: A Lot Help needed climbing 3-5 steps with a railing? : Total 6 Click Score: 11    End of Session   Activity Tolerance: Patient tolerated treatment well;Patient limited by fatigue Patient left: in chair;with call bell/phone within reach Nurse Communication: Mobility status PT Visit Diagnosis: Unsteadiness on feet (R26.81);Other abnormalities of gait and mobility  (R26.89);Muscle weakness (generalized) (M62.81)     Time: 4401-0272 PT Time Calculation (min) (ACUTE ONLY): 30 min  Charges:    $Therapeutic Exercise: 8-22 mins $Therapeutic Activity: 8-22 mins PT General Charges $$ ACUTE PT VISIT: 1 Visit                     12:20 PM, 02/25/23 Ocie Bob, MPT Physical Therapist with [facility] [phone] office [phone] mobile phone

## 2023-02-26 DIAGNOSIS — Z452 Encounter for adjustment and management of vascular access device: Secondary | ICD-10-CM | POA: Diagnosis not present

## 2023-02-26 DIAGNOSIS — I10 Essential (primary) hypertension: Secondary | ICD-10-CM | POA: Diagnosis not present

## 2023-02-26 DIAGNOSIS — N400 Enlarged prostate without lower urinary tract symptoms: Secondary | ICD-10-CM | POA: Diagnosis not present

## 2023-02-26 DIAGNOSIS — M7732 Calcaneal spur, left foot: Secondary | ICD-10-CM | POA: Diagnosis not present

## 2023-02-26 DIAGNOSIS — R1312 Dysphagia, oropharyngeal phase: Secondary | ICD-10-CM | POA: Diagnosis not present

## 2023-02-26 DIAGNOSIS — R5381 Other malaise: Secondary | ICD-10-CM | POA: Diagnosis not present

## 2023-02-26 DIAGNOSIS — K219 Gastro-esophageal reflux disease without esophagitis: Secondary | ICD-10-CM | POA: Diagnosis not present

## 2023-02-26 DIAGNOSIS — M47812 Spondylosis without myelopathy or radiculopathy, cervical region: Secondary | ICD-10-CM | POA: Diagnosis not present

## 2023-02-26 DIAGNOSIS — M6281 Muscle weakness (generalized): Secondary | ICD-10-CM | POA: Diagnosis not present

## 2023-02-26 DIAGNOSIS — B965 Pseudomonas (aeruginosa) (mallei) (pseudomallei) as the cause of diseases classified elsewhere: Secondary | ICD-10-CM | POA: Diagnosis not present

## 2023-02-26 DIAGNOSIS — N390 Urinary tract infection, site not specified: Secondary | ICD-10-CM | POA: Diagnosis not present

## 2023-02-26 DIAGNOSIS — R471 Dysarthria and anarthria: Secondary | ICD-10-CM | POA: Diagnosis not present

## 2023-02-26 DIAGNOSIS — S21201A Unspecified open wound of right back wall of thorax without penetration into thoracic cavity, initial encounter: Secondary | ICD-10-CM | POA: Diagnosis not present

## 2023-02-26 DIAGNOSIS — D649 Anemia, unspecified: Secondary | ICD-10-CM | POA: Diagnosis not present

## 2023-02-26 DIAGNOSIS — E41 Nutritional marasmus: Secondary | ICD-10-CM | POA: Diagnosis not present

## 2023-02-26 DIAGNOSIS — G061 Intraspinal abscess and granuloma: Secondary | ICD-10-CM | POA: Diagnosis not present

## 2023-02-26 DIAGNOSIS — S91302A Unspecified open wound, left foot, initial encounter: Secondary | ICD-10-CM | POA: Diagnosis not present

## 2023-02-26 DIAGNOSIS — R41841 Cognitive communication deficit: Secondary | ICD-10-CM | POA: Diagnosis not present

## 2023-02-26 DIAGNOSIS — E119 Type 2 diabetes mellitus without complications: Secondary | ICD-10-CM | POA: Diagnosis not present

## 2023-02-26 DIAGNOSIS — E785 Hyperlipidemia, unspecified: Secondary | ICD-10-CM | POA: Diagnosis not present

## 2023-02-26 DIAGNOSIS — L89124 Pressure ulcer of left upper back, stage 4: Secondary | ICD-10-CM | POA: Diagnosis not present

## 2023-02-26 DIAGNOSIS — L89613 Pressure ulcer of right heel, stage 3: Secondary | ICD-10-CM | POA: Diagnosis not present

## 2023-02-26 DIAGNOSIS — L89120 Pressure ulcer of left upper back, unstageable: Secondary | ICD-10-CM | POA: Diagnosis not present

## 2023-02-26 DIAGNOSIS — A4901 Methicillin susceptible Staphylococcus aureus infection, unspecified site: Secondary | ICD-10-CM | POA: Diagnosis not present

## 2023-02-26 DIAGNOSIS — T148XXA Other injury of unspecified body region, initial encounter: Secondary | ICD-10-CM | POA: Diagnosis not present

## 2023-02-26 DIAGNOSIS — L24A2 Irritant contact dermatitis due to fecal, urinary or dual incontinence: Secondary | ICD-10-CM | POA: Diagnosis not present

## 2023-02-26 DIAGNOSIS — R918 Other nonspecific abnormal finding of lung field: Secondary | ICD-10-CM | POA: Diagnosis not present

## 2023-02-26 DIAGNOSIS — I480 Paroxysmal atrial fibrillation: Secondary | ICD-10-CM | POA: Diagnosis not present

## 2023-02-26 DIAGNOSIS — F339 Major depressive disorder, recurrent, unspecified: Secondary | ICD-10-CM | POA: Diagnosis not present

## 2023-02-26 DIAGNOSIS — R0902 Hypoxemia: Secondary | ICD-10-CM | POA: Diagnosis not present

## 2023-02-26 DIAGNOSIS — L89624 Pressure ulcer of left heel, stage 4: Secondary | ICD-10-CM | POA: Diagnosis not present

## 2023-02-26 DIAGNOSIS — M86172 Other acute osteomyelitis, left ankle and foot: Secondary | ICD-10-CM | POA: Diagnosis not present

## 2023-02-26 DIAGNOSIS — T8130XA Disruption of wound, unspecified, initial encounter: Secondary | ICD-10-CM | POA: Diagnosis not present

## 2023-02-26 DIAGNOSIS — E440 Moderate protein-calorie malnutrition: Secondary | ICD-10-CM | POA: Diagnosis not present

## 2023-02-26 DIAGNOSIS — E876 Hypokalemia: Secondary | ICD-10-CM | POA: Diagnosis not present

## 2023-02-26 DIAGNOSIS — M4712 Other spondylosis with myelopathy, cervical region: Secondary | ICD-10-CM | POA: Diagnosis not present

## 2023-02-26 DIAGNOSIS — Z1621 Resistance to vancomycin: Secondary | ICD-10-CM | POA: Diagnosis not present

## 2023-02-26 DIAGNOSIS — E559 Vitamin D deficiency, unspecified: Secondary | ICD-10-CM | POA: Diagnosis not present

## 2023-02-26 DIAGNOSIS — G9341 Metabolic encephalopathy: Secondary | ICD-10-CM | POA: Diagnosis not present

## 2023-02-26 DIAGNOSIS — F419 Anxiety disorder, unspecified: Secondary | ICD-10-CM | POA: Diagnosis not present

## 2023-02-26 DIAGNOSIS — B9561 Methicillin susceptible Staphylococcus aureus infection as the cause of diseases classified elsewhere: Secondary | ICD-10-CM | POA: Diagnosis not present

## 2023-02-26 DIAGNOSIS — Z7401 Bed confinement status: Secondary | ICD-10-CM | POA: Diagnosis not present

## 2023-02-26 DIAGNOSIS — I82402 Acute embolism and thrombosis of unspecified deep veins of left lower extremity: Secondary | ICD-10-CM | POA: Diagnosis not present

## 2023-02-26 DIAGNOSIS — M4626 Osteomyelitis of vertebra, lumbar region: Secondary | ICD-10-CM | POA: Diagnosis not present

## 2023-02-26 LAB — GLUCOSE, CAPILLARY
Glucose-Capillary: 51 mg/dL — ABNORMAL LOW (ref 70–99)
Glucose-Capillary: 90 mg/dL (ref 70–99)
Glucose-Capillary: 123 mg/dL — ABNORMAL HIGH (ref 70–99)

## 2023-02-26 MED ORDER — CIPROFLOXACIN HCL 500 MG PO TABS: 500.0000 mg | ORAL_TABLET | Freq: Two times a day (BID) | ORAL | 0 refills | Status: AC

## 2023-02-26 MED ORDER — OXYCODONE HCL 5 MG PO TABS
5.0000 mg | ORAL_TABLET | ORAL | Status: DC | PRN
  Filled 2023-02-26: qty 1

## 2023-02-26 MED ORDER — CIPROFLOXACIN HCL 250 MG PO TABS: 500.0000 mg | ORAL_TABLET | Freq: Two times a day (BID) | ORAL | Status: DC

## 2023-02-26 MED ORDER — INSULIN GLARGINE-YFGN 100 UNIT/ML ~~LOC~~ SOLN: 4.0000 [IU] | Freq: Every day | SUBCUTANEOUS | Status: DC

## 2023-02-26 MED ORDER — OXYCODONE HCL 5 MG PO TABS: 5.0000 mg | ORAL_TABLET | Freq: Four times a day (QID) | ORAL | 0 refills | Status: DC | PRN

## 2023-02-26 MED ORDER — MEDIHONEY WOUND/BURN DRESSING EX PSTE: 1.0000 | PASTE | Freq: Every day | CUTANEOUS | Status: DC

## 2023-02-26 MED ORDER — INSULIN GLARGINE-YFGN 100 UNIT/ML ~~LOC~~ SOLN
4.0000 [IU] | Freq: Every day | SUBCUTANEOUS | Status: DC
  Filled 2023-02-26: qty 0.04

## 2023-02-26 MED ORDER — CEFADROXIL 500 MG PO CAPS
1000.0000 mg | ORAL_CAPSULE | Freq: Two times a day (BID) | ORAL | Status: DC
  Filled 2023-02-26 (×4): qty 2

## 2023-02-26 NOTE — Care Management Important Message (Signed)
Important Message  Patient Details  Name: Jim Ramirez MRN: [id number] Date of Birth: Jun 06, 1951   Medicare Important Message Given:  Yes     Corey Harold 02/26/2023, 2:43 PM

## 2023-02-26 NOTE — Progress Notes (Signed)
Attempted to call report to [facility].

## 2023-02-26 NOTE — Progress Notes (Signed)
Mobility Specialist Progress Note:    02/26/23 1235  Mobility  Activity  (stretches and exercises)  Level of Assistance Independent after set-up  Assistive Device None  Range of Motion/Exercises Active;Right leg;Left leg  Activity Response Tolerated well  Mobility Referral Yes  $Mobility charge 1 Mobility  Mobility Specialist Start Time (ACUTE ONLY) 1225  Mobility Specialist Stop Time (ACUTE ONLY) 1235  Mobility Specialist Time Calculation (min) (ACUTE ONLY) 10 min   Pt was received in chair, agreeable to mobility. Able to perform stretches and exercises in chair as tolerated (LE extensions x5, marching x10). Tolerated well, c/o of LLE swelling. Left pt in chair, call bell in reach. All needs met.   Lawerance Bach Mobility Specialist Please contact via Special educational needs teacher or  Rehab office at [phone]

## 2023-02-26 NOTE — Inpatient Diabetes Management (Signed)
Inpatient Diabetes Program Recommendations  AACE/ADA: New Consensus Statement on Inpatient Glycemic Control  Target Ranges:  Prepandial:   less than 140 mg/dL      Peak postprandial:   less than 180 mg/dL (1-2 hours)      Critically ill patients:  140 - 180 mg/dL    Latest Reference Range & Units 02/26/23 04:17  Glucose 70 - 99 mg/dL 62 (L)    Latest Reference Range & Units 02/25/23 07:32 02/25/23 07:53 02/25/23 10:52 02/25/23 16:08 02/25/23 20:35  Glucose-Capillary 70 - 99 mg/dL 41 (LL) 84 71 93 161 (H)    Latest Reference Range & Units 02/24/23 07:27 02/24/23 09:09 02/24/23 11:15 02/24/23 16:47 02/24/23 20:58  Glucose-Capillary 70 - 99 mg/dL 57 (L) 096 (H) 83 045 (H) 94   Review of Glycemic Control  Diabetes history: DM2 Outpatient Diabetes medications: Semglee 15 units at bedtime, Novolog 3 units TID with meals, Farxiga 10 mg daily Current orders for Inpatient glycemic control: Semglee 8 units at bedtime, Novolog 0-15 units TID with meals, Novolog 0-5 units at bedtime, Novolog 3 units TID with meals   Inpatient Diabetes Program Recommendations:     Insulin:  Lab glucose 62 mg/dl today.  Please consider decreasing Semglee to 4 units at bedtime, decreasing Novolog correction to 0-9 units TID with meals, and discontinuing Novolog 3 units TID (meal coverage; none given since admitted).  Thanks, Orlando Penner, RN, MSN, CDCES Diabetes Coordinator Inpatient Diabetes Program [phone] (Team Pager from 8am to 5pm)

## 2023-02-26 NOTE — Progress Notes (Signed)
Occupational Therapy Treatment Patient Details Name: Jim Ramirez MRN: [id number] DOB: 01/26/1951 Today's Date: 02/26/2023   History of present illness Durante Savard  is a 72 y.o. 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 Lovell Sheehan on 01/20/2023 , as well as a diagnosis of left lower extremity DVT status post IVC filter placement on 01/23/2023 presenting from [facility] rehab with recurrent falls and generalized weakness and altered mentation with psychosis/hallucinations   OT comments  Pt agreeable to OT treatment. Pt found to have had a bowel movement. NT called and assisted with peri-care while this OT assisted pt in rolling. Pt demonstrated need for mod A for bed mobility with slow labored movement. Pt was able to don goal with min A supine in bed. Pt required mod A for transfer to chair with RW. Unsteady in standing but improved from last attempt with OT. Pt left in chair with chair alarm set and nursing notified. Pt will benefit from continued OT in the hospital and recommended venue below to increase strength, balance, and endurance for safe ADL's.      Recommendations for follow up therapy are one component of a multi-disciplinary discharge planning process, led by the attending physician.  Recommendations may be updated based on patient status, additional functional criteria and insurance authorization.    Assistance Recommended at Discharge Frequent or constant Supervision/Assistance  Patient can return home with the following  A lot of help with walking and/or transfers;A lot of help with bathing/dressing/bathroom;Assistance with  cooking/housework;Assist for transportation;Help with stairs or ramp for entrance   Equipment Recommendations  None recommended by OT    Recommendations for Other Services      Precautions / Restrictions Precautions Precautions: Fall Restrictions Weight Bearing Restrictions: No       Mobility Bed Mobility Overal bed mobility: Needs Assistance Bed Mobility: Rolling, Supine to Sit Rolling: Min assist, Mod assist   Supine to sit: Mod assist     General bed mobility comments: increased time, labored movement    Transfers Overall transfer level: Needs assistance Equipment used: Rolling walker (2 wheels) Transfers: Sit to/from Stand, Bed to chair/wheelchair/BSC Sit to Stand: Mod assist     Step pivot transfers: Mod assist     General transfer comment: Unsteady; labored effort     Balance Overall balance assessment: Needs assistance Sitting-balance support: Feet supported, Bilateral upper extremity supported Sitting balance-Leahy Scale: Good Sitting balance - Comments: seated at EOB   Standing balance support: Bilateral upper extremity supported, During functional activity, Reliant on assistive device for balance Standing balance-Leahy Scale: Poor Standing balance comment: using RW                           ADL either performed or assessed with clinical judgement   ADL Overall ADL's : Needs assistance/impaired                             Toileting- Clothing Manipulation and Hygiene: Total assistance;Maximal assistance;Bed level Toileting - Clothing Manipulation Details (indicate cue type and reason): NT assisted with peri-care while OT assisted pt in rolling in bed.  Cognition Arousal/Alertness: Awake/alert Behavior During Therapy: WFL for tasks assessed/performed Overall Cognitive Status: No family/caregiver present to determine baseline cognitive functioning                                 General  Comments: Pt stating that he needed to leave to go pay his bills. Reminded pt that he was in the hospital.                           Pertinent Vitals/ Pain       Pain Assessment Pain Assessment: No/denies pain                                                          Frequency  Min 2X/week        Progress Toward Goals  OT Goals(current goals can now be found in the care plan section)  Progress towards OT goals: Progressing toward goals  Acute Rehab OT Goals Patient Stated Goal: improve function OT Goal Formulation: With patient Time For Goal Achievement: 03/10/23 Potential to Achieve Goals: Good ADL Goals Pt Will Perform Grooming: with min guard assist;sitting Pt Will Perform Lower Body Bathing: with min guard assist;sitting/lateral leans Pt Will Perform Upper Body Dressing: with modified independence;sitting Pt Will Perform Lower Body Dressing: with min guard assist;sitting/lateral leans Pt Will Transfer to Toilet: with min assist;stand pivot transfer Pt Will Perform Toileting - Clothing Manipulation and hygiene: with min assist;sitting/lateral leans Pt/caregiver will Perform Home Exercise Program: Increased ROM;Increased strength;Right Upper extremity;Left upper extremity;Independently  Plan Discharge plan remains appropriate    Co-evaluation                                   End of Session Equipment Utilized During Treatment: Rolling walker (2 wheels);Gait belt  OT Visit Diagnosis: Unsteadiness on feet (R26.81);Other abnormalities of gait and mobility (R26.89);Muscle weakness (generalized) (M62.81)   Activity Tolerance Patient tolerated treatment well   Patient Left in chair;with call bell/phone within reach;with chair alarm set   Nurse Communication Mobility status (Notified pt wanted pain meds.)        Time: 1610-9604 OT Time Calculation (min): 23 min  Charges: OT General Charges $OT Visit: 1 Visit OT  Treatments $Self Care/Home Management : 23-37 mins  Roshana Shuffield OT, MOT  Danie Chandler 02/26/2023, 10:55 AM

## 2023-02-26 NOTE — Consult Note (Signed)
[facility] Customer service manager [facility]) Accountable Care Organization (ACO) [facility] Liaison Note  02/26/2023  Jim Ramirez 1951/07/13 [id number]  Location: [facility] RN Hospital Liaison screened the patient remotely at [facility].  Insurance: [employer]   Jim Ramirez is a 72 y.o. male who is a Primary Care Patient of [name], MD [facility] Internal [facility]). The patient was screened for 30 day readmission hospitalization with noted extreme risk score for unplanned readmission risk with 2 IP in 6 months.  The patient was assessed for potential [facility]) Care Management service needs for post hospital transition for care coordination. Review of patient's electronic medical record reveals patient General weakness 2nd to hypokalemia. Pt will discharge to [facility] for ongoing STR SNF level of care. Facility will continue to address pt's needs at this time.    [facility] Care Management/Population Health does not replace or interfere with any arrangements made by the Inpatient Transition of Care team.   For questions contact:   Elliot Cousin, RN, [facility] Liaison [place]   Population Health Office Hours MTWF  8:00 am-6:00 pm Off on Thursday [phone] mobile [phone] [Office toll free line] Office Hours are M-F 8:30 - 5 pm 24 hour nurse advise line [phone] Concierge  [email or web address]

## 2023-02-26 NOTE — Progress Notes (Signed)
Nutrition Follow-up  DOCUMENTATION CODES:   Non-severe (moderate) malnutrition in context of chronic illness  INTERVENTION:  Continue Regular diet as ordered Change Ensure TID to Mighty Shake TID with meals, each supplement provides 330 kcals and 9 grams of protein 30 ml ProSource Plus BID, each supplement provides 100 kcals and 15 grams protein.  Magic cup TID with meals, each supplement provides 290 kcal and 9 grams of protein Continue MVI, Vitamin C and Zinc to support wound healing  NUTRITION DIAGNOSIS:   Moderate Malnutrition related to chronic illness (lumbar discitis s/p laminotomy/foraminotomies, epidural abscess) as evidenced by moderate fat depletion, moderate muscle depletion, mild muscle depletion, percent weight loss. - diagnosis updated 7/31  GOAL:   Patient will meet greater than or equal to 90% of their needs - goal unmet, addressing via meals and nutrition supplements  MONITOR:   PO intake, Supplement acceptance, Labs, Weight trends, Skin  REASON FOR ASSESSMENT:   Malnutrition Screening Tool    ASSESSMENT:   Pt with past medical history relevant for DM2, HTN, paroxysmal atrial fibrillation, with recent diagnosis and treatment for lumbar spine discitis and osteomyelitis and epidural abscess with stenosis and staph aureus/MSSA bacteremia, with negative TEE , S/P left L3-4, L4-5 and L5-S1 laminotomy/foraminotomies for drainage of epidural abscess and decompression of the thecal sac, left lower extremity DVT status post IVC filter placement admitted with persistent hypokalemia possible UTI and acute metabolic encephalopathy  7/30 - s/p bedside debridement of stage 2 thoracic pressure ulcer  Plans for d/c to SNF today.   Pt sitting up in bedside chair attempting to open Magic Cup, assisted pt with opening. Consumed about 90% during visit. Pt recalls eating breakfast but cannot recall what he had but states that he only "ate a bit."   Unable to elicit much recent  detailed nutrition related history from patient. Pt oriented x2 per flowsheet documentation. No family present at bedside at time of visit. He states that he has been "eating enough to stay above ground."   Meal completions: 7/30: 25% breakfast, 10% lunch, 10% dinner 7/31: 100% lunch  Per MAR, pt received 1 ProSource yesterday and today, refused second dose today. Received 3 Ensure since yesterday however refused supplement at 1400. Per review of RD notes from prior admissions, pt did not enjoy Ensure but seemed to be consuming [employer]. Will adjust order in the event pt remains admitted.   Per review of weight history, pt's weight noted to have been trending down since 3/11. Pt with weight loss of 9.8% since that time which is significant for time frame.   During admission in June, pt diagnosed with acute malnutrition. Given increased muscle and fat deficits as well as ongoing weight loss, this appears to   Medications:  Vitamin C 500mg  BID, cipro, ferrous sulfate, SSI 0-15 units TID, semglee 4 units daily, meidhoney, MVI, protonix, senna, zinc  Labs: CBG's 51-129 x24 hours   NUTRITION - FOCUSED PHYSICAL EXAM:  Flowsheet Row Most Recent Value  Orbital Region Moderate depletion  Upper Arm Region Moderate depletion  Thoracic and Lumbar Region Mild depletion  Buccal Region Severe depletion  Temple Region Moderate depletion  Clavicle Bone Region Severe depletion  Clavicle and Acromion Bone Region Moderate depletion  Scapular Bone Region Mild depletion  Dorsal Hand Unable to assess  [pt eating]  Patellar Region Mild depletion  Anterior Thigh Region Mild depletion  Posterior Calf Region Mild depletion  Edema (RD Assessment) Mild  [BLE]  Hair Reviewed  Eyes Reviewed  Mouth  Other (Comment)  [edentulous]  Skin Reviewed  Nails Reviewed       Diet Order:   Diet Order             Diet regular Room service appropriate? Yes; Fluid consistency: Thin  Diet effective now                    EDUCATION NEEDS:   No education needs have been identified at this time  Skin:  Skin Assessment: Skin Integrity Issues: Skin Integrity Issues:: DTI DTI: L heel Stage II: sacrum Unstageable: vertebral column  Last BM:  02/25/23 (type 6)  Height:   Ht Readings from Last 1 Encounters:  02/22/23 6' (1.829 m)    Weight:   Wt Readings from Last 1 Encounters:  02/24/23 85.1 kg    Ideal Body Weight:  80.9 kg  BMI:  Body mass index is 25.44 kg/m.  Estimated Nutritional Needs:   Kcal:  2200-2400  Protein:  120-135 grams  Fluid:  > 2 L  Drusilla Kanner, RDN, LDN Clinical Nutrition

## 2023-02-26 NOTE — Discharge Summary (Signed)
Physician Discharge Summary   Patient: Jim Ramirez MRN: [id number] DOB: 1950-09-18  Admit date:     02/22/2023  Discharge date: 02/26/23  Discharge Physician: [name]   PCP: [name], MD   Recommendations at discharge:   Follow up urine culture sensitivities.  Follow up with Dr. Lovell Sheehan postoperatively per routine Follow up with PCP in 1-2 weeks, suggest recheck BMP, CBC.  Discharge Diagnoses: Principal Problem:   Hypokalemia Active Problems:   Paroxysmal atrial fibrillation with RVR (HCC)   Diabetes mellitus type 2 in nonobese (HCC)   Duodenal ulcer without hemorrhage or perforation and without obstruction   Pressure injury of right thoracic region of back, stage 2 [facility])  Hospital Course: 72 y.o. 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 Lovell Sheehan on 01/20/2023 , as well as a diagnosis of left lower extremity DVT status post IVC filter placement on 01/23/2023 admitted on 02/22/2023 with persistent hypokalemia possible UTI and acute metabolic encephalopathy   Assessment and Plan: 1)Acute Metabolic Encephalopathy---due to Pseudomonas and Serratia UTI -CT without acute findings -No leukocytosis -Chest x-ray without acute findings -Ammonia is not elevated - Mentation has improved with UTI treatment.    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 Lovell Sheehan on 01/20/2023   --He completed IV Ancef on 02/18/2023 and was transitioned to p.o.cefadroxil 1000 mg bid -Will continue Cefadroxil 1000 mg --bid for at least 10 (ten) months from 02/18/23 as per ID   3) Pseudomonas and Serratia marcescens UTI --- Complete 7 days Tx with cipro. - Received Diflucan for yeast in urine -Sensitivities on Pseudomonas and Serratia are still pending though pt improving. Will DC with fluoroquinolone.    4)Left lower extremity DVT status post IVC filter placement on 01/23/2023 --- continue Eliquis   5)PAFib---EKG A-fib RBB heart rate in the 80s -TTE and TEE from June 2024 with EF of 65% without aortic stenosis -Continue amiodarone and metoprolol for rate control -Eliquis for stroke prophylaxis   6) severe hypokalemia/hypernatremia ---  -Magnesium WNL -Phosphorous WNL -Can continue home medications.  7)Generalized weakness/ambulatory dysfunction/recurrent falls in the setting of recent back surgery =--Patient was getting PT at [facility] -PT eval appreciated--- - awaiting transfer to [facility] rehab SNF pending insurance approval   8)Decubitus Ulcer/PI--mid back area---POA -Wound care recommendations appreciated -Status post bedside debridement by Dr. Henreitta Leber on 02/25/2023 - Per WOC: medihoney to wound bed for soft slough vertebral column. I also checked heels and he has a significant DTPI 4 x 4 to left lateral heel evolving to full thickness. I placed orders for that too. Ordered him a low air loss mattress from [employer]. Also needs Prevalon boots.    9)DM2-recent A1c 8.5 Use Novolog/Humalog Sliding scale insulin with Accu-Cheks/Fingersticks as ordered  -Episode of hypoglycemia overnight, we've decreased basal insulin from 15u PTA to 4u  qHS. Continue mealtime insulin iff eats >50%.  -Encourage increased oral intake   10)GERD--continue Protonix   11)HLD--continue Crestor     Consultants: Surgery Procedures  performed: Bedside debridement  Disposition: Skilled  nursing facility Diet recommendation:  Carb modified diet DISCHARGE MEDICATION: Allergies as of 02/26/2023   No Known Allergies      Medication List     STOP taking these medications    ceFAZolin IVPB Commonly known as: ANCEF   omeprazole 20 MG capsule Commonly known as: PRILOSEC       TAKE these medications    amiodarone 200 MG tablet Commonly known as: PACERONE Take 1 tablet (200 mg total) by mouth daily.   apixaban 5 MG Tabs tablet Commonly known as: ELIQUIS Take 1 tablet (5 mg total) by mouth 2 (two) times daily.   azelastine 0.1 % nasal spray Commonly known as: ASTELIN Place 1 spray into both nostrils 2 (two) times daily. Use in each nostril as directed   cefadroxil 500 MG capsule Commonly known as: DURICEF Take 2 capsules (1,000 mg total) by mouth 2 (two) times daily. Continue for long term   ciprofloxacin 500 MG tablet Commonly known as: CIPRO Take 1 tablet (500 mg total) by mouth 2 (two) times daily for 10 doses.   cyanocobalamin 1000 MCG/ML injection Commonly known as: VITAMIN B12 Inject 1,000 mcg into the muscle every 30 (thirty) days.   dapagliflozin propanediol 10 MG Tabs tablet Commonly known as: FARXIGA Take 10 mg by mouth daily.   diclofenac Sodium 1 % Gel Commonly known as: VOLTAREN Apply 2 g topically 4 (four) times daily.   docusate sodium 100 MG capsule Commonly known as: COLACE Take 1 capsule (100 mg total) by mouth 2 (two) times daily.   escitalopram 10 MG tablet Commonly known as: LEXAPRO Take 10 mg by mouth daily.   esomeprazole 40 MG capsule Commonly known as: NEXIUM Take 40 mg by mouth daily.   ferrous sulfate 325 (65 FE) MG tablet Take 1 tablet (325 mg total) by mouth daily with breakfast.   furosemide 20 MG tablet Commonly known as: LASIX Take 1 tablet (20 mg total) by mouth daily.   gabapentin 100 MG capsule Commonly known as: NEURONTIN Take 1 capsule (100 mg total) by mouth 2 (two) times daily.   insulin  aspart 100 UNIT/ML injection Commonly known as: novoLOG Inject 3 Units into the skin 3 (three) times daily with meals. If eats more than 50% of meals   insulin glargine-yfgn 100 UNIT/ML injection Commonly known as: SEMGLEE Inject 0.04 mLs (4 Units total) into the skin at bedtime. What changed: how much to take   leptospermum manuka honey Pste paste Apply 1 Application topically daily. Start taking on: February 27, 2023   levocetirizine 5 MG tablet Commonly known as: XYZAL Take 5 mg by mouth every evening.   metoprolol tartrate 25 MG tablet Commonly known as: LOPRESSOR Take 0.5 tablets (12.5 mg total) by mouth 2 (two) times daily.   nystatin powder Commonly known as: MYCOSTATIN/NYSTOP Apply 1 Application topically daily. For 10 days   oxyCODONE 5 MG immediate release tablet Commonly known as: Oxy IR/ROXICODONE Take 1 tablet (5 mg total) by mouth every 6 (six) hours as needed for moderate pain or severe pain. What changed:  when to take this reasons to take this   potassium chloride SA 20 MEQ tablet Commonly known as: KLOR-CON M Take 1 tablet (20 mEq total) by mouth daily.   risperiDONE 0.5 MG tablet Commonly known as: RISPERDAL Take 1 tablet (0.5 mg total) by mouth at bedtime.   rosuvastatin 20 MG tablet Commonly known as: CRESTOR Take 20 mg by mouth daily.  tamsulosin 0.4 MG Caps capsule Commonly known as: FLOMAX Take 0.4 mg by mouth daily.   torsemide 20 MG tablet Commonly known as: DEMADEX Take 20 mg by mouth daily.        Contact information for follow-up providers     [name], MD Follow up.   Specialty: Internal Medicine Contact information: [address] [place] [address] [phone]              Contact information for after-discharge care     Destination     [facility] .   Service: Skilled Nursing Contact information: [address] [place] [address] [phone]                     Discharge Exam: Filed Weights   02/22/23 1884 02/22/23 2011 02/24/23 0500  Weight: 88.5 kg 85.6 kg 85.1 kg  BP 100/72 (BP Location: Right Arm)   Pulse 76   Temp 97.7 F (36.5 C) (Oral)   Resp 17   Ht 6' (1.829 m)   Wt 85.1 kg   SpO2 90%   BMI 25.44 kg/m   Elderly grumpy male in no distress Clear, nonlabored RRR, no MRG.  Dressings on back are c/d/i  Condition at discharge: stable  The results of significant diagnostics from this hospitalization (including imaging, microbiology, ancillary and laboratory) are listed below for reference.   Imaging Studies: CT Head Wo Contrast  Result Date: 02/22/2023 CLINICAL DATA:  Provided history: Mental status change, unknown cause. Multiple falls. EXAM: CT HEAD WITHOUT CONTRAST TECHNIQUE: Contiguous axial images were obtained from the base of the skull through the vertex without intravenous contrast. RADIATION DOSE REDUCTION: This exam was performed according to the departmental dose-optimization program which includes automated exposure control, adjustment of the mA and/or kV according to patient size and/or use of iterative reconstruction technique. COMPARISON:  Brain MRI 12/28/2022.  Head CT 12/23/2022. FINDINGS: Brain: Mild generalized parenchymal atrophy. There is no acute intracranial hemorrhage. No demarcated cortical infarct. No extra-axial fluid collection. No evidence of an intracranial mass. No midline shift. Vascular: No hyperdense vessel.  Atherosclerotic calcifications. Skull: No calvarial fracture or aggressive osseous lesion. Sinuses/Orbits: No mass or acute finding within the imaged orbits. Postsurgical appearance of the paranasal sinuses. Minimal mucosal thickening within the bilateral maxillary sinuses. IMPRESSION: 1. No evidence of an acute intracranial abnormality. 2. Mild generalized parenchymal atrophy. 3. Minimal mucosal thickening within the bilateral maxillary sinuses. Electronically Signed   By: Jackey Loge D.O.   On:  02/22/2023 10:56   DG Chest Port 1 View  Result Date: 02/22/2023 CLINICAL DATA:  Altered mental status. EXAM: PORTABLE CHEST 1 VIEW COMPARISON:  12/26/2022 FINDINGS: The heart size and mediastinal contours are within normal limits. Stable elevation of left hemidiaphragm. Both lungs are clear. IMPRESSION: No active disease. Electronically Signed   By: Danae Orleans M.D.   On: 02/22/2023 10:42    Microbiology: Results for orders placed or performed during the hospital encounter of 02/22/23  SARS Coronavirus 2 by RT PCR (hospital order, performed in [facility] hospital lab) *cepheid single result test* Anterior Nasal Swab     Status: None   Collection Time: 02/22/23  9:56 AM   Specimen: Anterior Nasal Swab  Result Value Ref Range Status   SARS Coronavirus 2 by RT PCR NEGATIVE NEGATIVE Final    Comment: (NOTE) SARS-CoV-2 target nucleic acids are NOT DETECTED.  The SARS-CoV-2 RNA is generally detectable in upper and lower respiratory specimens  during the acute phase of infection. The lowest concentration of SARS-CoV-2 viral copies this assay can detect is 250 copies / mL. A negative result does not preclude SARS-CoV-2 infection and should not be used as the sole basis for treatment or other patient management decisions.  A negative result may occur with improper specimen collection / handling, submission of specimen other than nasopharyngeal swab, presence of viral mutation(s) within the areas targeted by this assay, and inadequate number of viral copies (<250 copies / mL). A negative result must be combined with clinical observations, patient history, and epidemiological information.  Fact Sheet for Patients:   RoadLapTop.co.za  Fact Sheet for Healthcare Providers: http://kim-miller.com/  This test is not yet approved or  cleared by the [place] FDA and has been authorized for detection and/or diagnosis of SARS-CoV-2 by FDA under an  Emergency Use Authorization (EUA).  This EUA will remain in effect (meaning this test can be used) for the duration of the COVID-19 declaration under Section 564(b)(1) of the Act, 21 U.S.C. section 360bbb-3(b)(1), unless the authorization is terminated or revoked sooner.  Performed at [facility], [address], [place], [place] [address]   Culture, blood (routine x 2)     Status: None (Preliminary result)   Collection Time: 02/22/23 10:18 AM   Specimen: BLOOD LEFT WRIST  Result Value Ref Range Status   Specimen Description   Final    BLOOD LEFT WRIST BOTTLES DRAWN AEROBIC AND ANAEROBIC   Special Requests Blood Culture adequate volume  Final   Culture   Final    NO GROWTH 4 DAYS Performed at [facility], [address] [place], [place] [address]    Report Status PENDING  Incomplete  Culture, blood (routine x 2)     Status: None (Preliminary result)   Collection Time: 02/22/23 10:30 AM   Specimen: BLOOD RIGHT WRIST  Result Value Ref Range Status   Specimen Description   Final    BLOOD RIGHT WRIST BOTTLES DRAWN AEROBIC AND ANAEROBIC   Special Requests Blood Culture adequate volume  Final   Culture   Final    NO GROWTH 4 DAYS Performed at [facility], [address] [place], [place] [address]    Report Status PENDING  Incomplete  Urine Culture (for pregnant, neutropenic or urologic patients or patients with an indwelling urinary catheter)     Status: Abnormal (Preliminary result)   Collection Time: 02/22/23 12:00 PM   Specimen: Urine, Clean Catch  Result Value Ref Range Status   Specimen Description   Final    URINE, CLEAN CATCH Performed at [facility], [address], [place], [place] [address]    Special Requests   Final    NONE Performed at [facility], [address] [place], [place] [address]    Culture (A)  Final    >=100,000 COLONIES/mL SERRATIA MARCESCENS >=100,000 COLONIES/mL PSEUDOMONAS AERUGINOSA CULTURE REINCUBATED FOR BETTER GROWTH Performed at [facility], [address], [place], [place] [address]    Report Status PENDING  Incomplete  MRSA Next Gen by PCR, Nasal     Status: None   Collection Time: 02/22/23  8:01 PM   Specimen: Nasal Mucosa; Nasal Swab  Result Value Ref Range Status   MRSA by PCR Next Gen NOT DETECTED NOT DETECTED Final    Comment: (NOTE) The GeneXpert MRSA Assay (FDA approved for NASAL specimens only), is one component of a comprehensive MRSA colonization surveillance program. It is not intended to diagnose MRSA infection  nor to guide or monitor treatment for MRSA infections. Test performance is not FDA approved in patients less than 22 years old. Performed at [facility], [address] [place], [place] [address]     Labs: CBC: Recent Labs  Lab 02/22/23 1038 02/23/23 0435 02/24/23 0405 02/26/23 0417  WBC 10.5 6.1 5.3 5.9  NEUTROABS 7.6  --   --   --   HGB 8.6* 7.9* 8.0* 8.4*  HCT 28.5* 26.5* 27.6* 28.0*  MCV 96.0 96.0 98.9 97.6  PLT 125* 161 137* 135*   Basic Metabolic Panel: Recent Labs  Lab 02/22/23 1237 02/22/23 1548 02/23/23 0435 02/23/23 1440 02/24/23 0405 02/26/23 0417  NA  --  147* 148* 145 146* 143  K  --  2.4* 2.6* 3.9 4.4 3.6  CL  --  108 113* 113* 116* 114*  CO2  --  29 30 27 28 24   GLUCOSE  --  85 58* 100* 65* 62*  BUN  --  12 10 9 8  6*  CREATININE  --  0.93 0.89 0.80 0.76 0.69  CALCIUM  --  7.5* 7.2* 7.2* 7.4* 7.7*  MG 1.8  --   --   --   --   --   PHOS  --  2.7  --   --  1.8*  --    Liver Function Tests: Recent Labs  Lab 02/22/23 1038 02/22/23 1548 02/24/23 0405  AST 28  --   --   ALT <5  --   --   ALKPHOS 93  --   --   BILITOT 0.9  --   --   PROT 6.0*  --   --   ALBUMIN 1.8* 1.7* 1.6*   CBG: Recent Labs  Lab 02/25/23 1608 02/25/23 2035 02/26/23 0809 02/26/23 0851 02/26/23 1100  GLUCAP 93 129* 51* 90 123*    Discharge time spent: greater than 30 minutes.  Signed: [name], MD [facility] 02/26/2023

## 2023-02-26 NOTE — TOC Transition Note (Signed)
Transition of Care [facility]) - CM/SW Discharge Note   Patient Details  Name: Jim Ramirez MRN: [id number] Date of Birth: 1951/05/19  [facility] CM/SW Contact:  Villa Herb, LCSWA Phone Number: 02/26/2023, 2:34 PM   Clinical Narrative:    CSW updated that pt is medically ready for D/C to SNF facility today. CSW spoke to [place] with admissions who states they are ready to accept. CSW sent D/C clinicals over in the [facility]. CSW provided RN with room and report numbers. CSW left VM for pts son to update on D/C. CSW to complete med necessity and send to the floor for RN. CSW added pt to EMS transport list. TOC signing off.   Final next level of care: Skilled Nursing Facility Barriers to Discharge: Barriers Resolved   Patient Goals and CMS Choice CMS Medicare.gov Compare Post Acute Care list provided to:: Patient Represenative (must comment) Choice offered to / list presented to : Adult Children  Discharge Placement                  Patient to be transferred to facility by: EMS Name of family member notified: Son Patient and family notified of of transfer: 02/26/23  Discharge Plan and Services Additional resources added to the After Visit Summary for   In-house Referral: Clinical Social Work Discharge Planning Services: CM Consult Post Acute Care Choice: Skilled Nursing Facility                               Social Determinants of Health (SDOH) Interventions SDOH Screenings   Food Insecurity: No Food Insecurity (02/22/2023)  Housing: Low Risk  (02/22/2023)  Transportation Needs: No Transportation Needs (02/22/2023)  Utilities: Not At Risk (02/22/2023)  Tobacco Use: Medium Risk (02/22/2023)     Readmission Risk Interventions     No data to display

## 2023-02-27 LAB — URINE CULTURE: Culture: >=100000 — AB

## 2023-02-27 LAB — CULTURE, BLOOD (ROUTINE X 2)
Culture: NO GROWTH
Culture: NO GROWTH
Special Requests: ADEQUATE
Special Requests: ADEQUATE

## 2023-03-03 DIAGNOSIS — E119 Type 2 diabetes mellitus without complications: Secondary | ICD-10-CM | POA: Diagnosis not present

## 2023-03-03 DIAGNOSIS — G9341 Metabolic encephalopathy: Secondary | ICD-10-CM | POA: Diagnosis not present

## 2023-03-03 DIAGNOSIS — E559 Vitamin D deficiency, unspecified: Secondary | ICD-10-CM | POA: Diagnosis not present

## 2023-03-03 DIAGNOSIS — N390 Urinary tract infection, site not specified: Secondary | ICD-10-CM | POA: Diagnosis not present

## 2023-03-03 DIAGNOSIS — T8130XA Disruption of wound, unspecified, initial encounter: Secondary | ICD-10-CM | POA: Diagnosis not present

## 2023-03-03 DIAGNOSIS — R5381 Other malaise: Secondary | ICD-10-CM | POA: Diagnosis not present

## 2023-03-03 DIAGNOSIS — I10 Essential (primary) hypertension: Secondary | ICD-10-CM | POA: Diagnosis not present

## 2023-03-04 DIAGNOSIS — E119 Type 2 diabetes mellitus without complications: Secondary | ICD-10-CM | POA: Diagnosis not present

## 2023-03-04 DIAGNOSIS — I10 Essential (primary) hypertension: Secondary | ICD-10-CM | POA: Diagnosis not present

## 2023-03-05 DIAGNOSIS — I82402 Acute embolism and thrombosis of unspecified deep veins of left lower extremity: Secondary | ICD-10-CM | POA: Diagnosis not present

## 2023-03-05 DIAGNOSIS — F339 Major depressive disorder, recurrent, unspecified: Secondary | ICD-10-CM | POA: Diagnosis not present

## 2023-03-05 DIAGNOSIS — E876 Hypokalemia: Secondary | ICD-10-CM | POA: Diagnosis not present

## 2023-03-05 DIAGNOSIS — E440 Moderate protein-calorie malnutrition: Secondary | ICD-10-CM | POA: Diagnosis not present

## 2023-03-05 DIAGNOSIS — L89624 Pressure ulcer of left heel, stage 4: Secondary | ICD-10-CM | POA: Diagnosis not present

## 2023-03-05 DIAGNOSIS — G9341 Metabolic encephalopathy: Secondary | ICD-10-CM | POA: Diagnosis not present

## 2023-03-05 DIAGNOSIS — S91302A Unspecified open wound, left foot, initial encounter: Secondary | ICD-10-CM | POA: Diagnosis not present

## 2023-03-05 DIAGNOSIS — L89120 Pressure ulcer of left upper back, unstageable: Secondary | ICD-10-CM | POA: Diagnosis not present

## 2023-03-05 DIAGNOSIS — B9561 Methicillin susceptible Staphylococcus aureus infection as the cause of diseases classified elsewhere: Secondary | ICD-10-CM | POA: Diagnosis not present

## 2023-03-05 DIAGNOSIS — K219 Gastro-esophageal reflux disease without esophagitis: Secondary | ICD-10-CM | POA: Diagnosis not present

## 2023-03-05 DIAGNOSIS — M7732 Calcaneal spur, left foot: Secondary | ICD-10-CM | POA: Diagnosis not present

## 2023-03-05 DIAGNOSIS — R41841 Cognitive communication deficit: Secondary | ICD-10-CM | POA: Diagnosis not present

## 2023-03-05 DIAGNOSIS — L24A2 Irritant contact dermatitis due to fecal, urinary or dual incontinence: Secondary | ICD-10-CM | POA: Diagnosis not present

## 2023-03-05 DIAGNOSIS — R1312 Dysphagia, oropharyngeal phase: Secondary | ICD-10-CM | POA: Diagnosis not present

## 2023-03-05 DIAGNOSIS — R471 Dysarthria and anarthria: Secondary | ICD-10-CM | POA: Diagnosis not present

## 2023-03-05 DIAGNOSIS — F419 Anxiety disorder, unspecified: Secondary | ICD-10-CM | POA: Diagnosis not present

## 2023-03-05 DIAGNOSIS — M6281 Muscle weakness (generalized): Secondary | ICD-10-CM | POA: Diagnosis not present

## 2023-03-05 DIAGNOSIS — N400 Enlarged prostate without lower urinary tract symptoms: Secondary | ICD-10-CM | POA: Diagnosis not present

## 2023-03-06 DIAGNOSIS — I10 Essential (primary) hypertension: Secondary | ICD-10-CM | POA: Diagnosis not present

## 2023-03-06 DIAGNOSIS — D649 Anemia, unspecified: Secondary | ICD-10-CM | POA: Diagnosis not present

## 2023-03-07 DIAGNOSIS — M47812 Spondylosis without myelopathy or radiculopathy, cervical region: Secondary | ICD-10-CM | POA: Diagnosis not present

## 2023-03-07 DIAGNOSIS — G061 Intraspinal abscess and granuloma: Secondary | ICD-10-CM | POA: Diagnosis not present

## 2023-03-07 DIAGNOSIS — M4712 Other spondylosis with myelopathy, cervical region: Secondary | ICD-10-CM | POA: Diagnosis not present

## 2023-03-11 DIAGNOSIS — E440 Moderate protein-calorie malnutrition: Secondary | ICD-10-CM | POA: Diagnosis not present

## 2023-03-11 DIAGNOSIS — R1312 Dysphagia, oropharyngeal phase: Secondary | ICD-10-CM | POA: Diagnosis not present

## 2023-03-11 DIAGNOSIS — E876 Hypokalemia: Secondary | ICD-10-CM | POA: Diagnosis not present

## 2023-03-11 DIAGNOSIS — K219 Gastro-esophageal reflux disease without esophagitis: Secondary | ICD-10-CM | POA: Diagnosis not present

## 2023-03-11 DIAGNOSIS — R471 Dysarthria and anarthria: Secondary | ICD-10-CM | POA: Diagnosis not present

## 2023-03-11 DIAGNOSIS — E119 Type 2 diabetes mellitus without complications: Secondary | ICD-10-CM | POA: Diagnosis not present

## 2023-03-11 DIAGNOSIS — G9341 Metabolic encephalopathy: Secondary | ICD-10-CM | POA: Diagnosis not present

## 2023-03-11 DIAGNOSIS — E559 Vitamin D deficiency, unspecified: Secondary | ICD-10-CM | POA: Diagnosis not present

## 2023-03-11 DIAGNOSIS — I480 Paroxysmal atrial fibrillation: Secondary | ICD-10-CM | POA: Diagnosis not present

## 2023-03-11 DIAGNOSIS — R5381 Other malaise: Secondary | ICD-10-CM | POA: Diagnosis not present

## 2023-03-11 DIAGNOSIS — B9561 Methicillin susceptible Staphylococcus aureus infection as the cause of diseases classified elsewhere: Secondary | ICD-10-CM | POA: Diagnosis not present

## 2023-03-12 ENCOUNTER — Other Ambulatory Visit: Payer: Self-pay

## 2023-03-12 ENCOUNTER — Encounter: Payer: Self-pay | Admitting: Internal Medicine

## 2023-03-12 ENCOUNTER — Ambulatory Visit (INDEPENDENT_AMBULATORY_CARE_PROVIDER_SITE_OTHER): Payer: Medicare HMO | Admitting: Internal Medicine

## 2023-03-12 VITALS — BP 88/53 | HR 68 | Temp 97.7°F

## 2023-03-12 DIAGNOSIS — M4626 Osteomyelitis of vertebra, lumbar region: Secondary | ICD-10-CM

## 2023-03-12 DIAGNOSIS — A4901 Methicillin susceptible Staphylococcus aureus infection, unspecified site: Secondary | ICD-10-CM

## 2023-03-12 DIAGNOSIS — S21201A Unspecified open wound of right back wall of thorax without penetration into thoracic cavity, initial encounter: Secondary | ICD-10-CM

## 2023-03-12 DIAGNOSIS — L89624 Pressure ulcer of left heel, stage 4: Secondary | ICD-10-CM | POA: Diagnosis not present

## 2023-03-12 DIAGNOSIS — Z452 Encounter for adjustment and management of vascular access device: Secondary | ICD-10-CM | POA: Diagnosis not present

## 2023-03-12 DIAGNOSIS — L89124 Pressure ulcer of left upper back, stage 4: Secondary | ICD-10-CM | POA: Diagnosis not present

## 2023-03-12 NOTE — Progress Notes (Signed)
Patient ID: Jim Ramirez, male   DOB: [birth year], 72 y.o.   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 Lovell Sheehan on 01/20/2023  --He completed IV Ancef on 02/18/2023 and was transitioned to p.o.cefadroxil 1000 mg bid -Will continue Cefadroxil 1000 mg --bid for at least 10 (ten) months from 02/18/23 as per ID   He was admitted briefly for uti and discharged on 7/31. He was seen at SNF by wound care physician who decided to place a new picc line this morning to treat for deep wound infection- notes reviewed states that he will be started on vancomycin, cefepime, plus metronidazole.  Repeat BP 88/52. Patient denies dizziness, shortness of breath, chest pain. Outpatient Encounter Medications as of 03/12/2023  Medication Sig   amiodarone (PACERONE) 200 MG tablet Take 1 tablet (200 mg total) by mouth daily.   apixaban (ELIQUIS) 5 MG TABS tablet Take 1 tablet (5 mg total) by mouth 2 (two) times daily.   atorvastatin (LIPITOR) 40 MG tablet    cefadroxil (DURICEF) 500 MG capsule Take 2 capsules (1,000 mg total) by mouth 2 (two) times daily. Continue for long term   cyanocobalamin (VITAMIN B12) 1000 MCG/ML injection Inject 1,000 mcg into the muscle every 30 (thirty) days.   dapagliflozin propanediol (FARXIGA) 10 MG TABS tablet Take 10 mg by mouth daily.   docusate sodium (COLACE) 100 MG capsule Take 1 capsule (100 mg total) by mouth 2 (two) times daily.   escitalopram (LEXAPRO) 10 MG tablet Take 10 mg by mouth daily.   ferrous sulfate 325 (65 FE) MG tablet Take 1 tablet (325 mg total) by mouth daily with breakfast.   furosemide (LASIX) 20 MG tablet Take 1 tablet (20 mg total) by mouth daily.    gabapentin (NEURONTIN) 100 MG capsule Take 1 capsule (100 mg total) by mouth 2 (two) times daily.   hydrOXYzine (ATARAX) 25 MG tablet Take 25 mg by mouth 3 (three) times daily as needed.   insulin glargine-yfgn (SEMGLEE) 100 UNIT/ML injection Inject 0.04 mLs (4 Units total) into the skin at bedtime.   leptospermum manuka honey (MEDIHONEY) PSTE paste Apply 1 Application topically daily.   metoprolol tartrate (LOPRESSOR) 25 MG tablet Take 0.5 tablets (12.5 mg total) by mouth 2 (two) times daily.   Omeprazole 20 MG TBEC    oxyCODONE (OXY IR/ROXICODONE) 5 MG immediate release tablet Take 1 tablet (5 mg total) by mouth every 6 (six) hours as needed for moderate pain or severe pain.   potassium chloride SA (KLOR-CON M) 20 MEQ tablet Take 1 tablet (20 mEq total) by mouth daily.   tamsulosin (FLOMAX) 0.4 MG CAPS capsule Take 0.4 mg by mouth daily.   azelastine (ASTELIN) 0.1 % nasal spray Place 1 spray into both nostrils 2 (two) times daily. Use in each nostril as directed (Patient not taking: Reported on 03/12/2023)   diclofenac Sodium (VOLTAREN) 1 % GEL Apply 2 g topically 4 (four) times daily. (Patient not taking: Reported on 03/12/2023)   esomeprazole (NEXIUM) 40 MG capsule Take 40 mg by mouth daily. (Patient not taking: Reported on 03/12/2023)   insulin aspart (NOVOLOG) 100 UNIT/ML injection Inject 3 Units into the skin 3 (three) times daily with meals. If eats more than  50% of meals   levocetirizine (XYZAL) 5 MG tablet Take 5 mg by mouth every evening. (Patient not taking: Reported on 03/12/2023)   nystatin (MYCOSTATIN/NYSTOP) powder Apply 1 Application topically daily. For 10 days (Patient not taking: Reported on 03/12/2023)   risperiDONE (RISPERDAL) 0.5 MG tablet Take 1 tablet (0.5 mg total) by mouth at bedtime. (Patient not taking: Reported on 03/12/2023)   rosuvastatin (CRESTOR) 20 MG tablet Take 20 mg by mouth daily. (Patient not taking: Reported on 03/12/2023)   torsemide (DEMADEX) 20 MG tablet Take 20  mg by mouth daily. (Patient not taking: Reported on 03/12/2023)   No facility-administered encounter medications on file as of 03/12/2023.     Patient Active Problem List   Diagnosis Date Noted   Pressure injury of right thoracic region of back, stage 2 (HCC) 02/24/2023   Hypokalemia 02/22/2023   Paroxysmal atrial fibrillation with RVR (HCC) 02/22/2023   Malnutrition of moderate degree 01/14/2023   MSSA bacteremia 12/26/2022   New onset atrial fibrillation (HCC) 12/24/2022   Discitis of lumbar region 12/24/2022   Sepsis (HCC) 12/24/2022   BPH (benign prostatic hyperplasia) 12/24/2022   Mood disorder (HCC) 12/24/2022   Cervical spondylosis with myelopathy and radiculopathy 05/22/2022   Diabetes mellitus type 2 in nonobese (HCC) 02/26/2008   Dyslipidemia 02/26/2008   Essential hypertension 02/26/2008   INTERNAL HEMORRHOIDS 02/26/2008   EXTERNAL HEMORRHOIDS 02/26/2008   Duodenal ulcer without hemorrhage or perforation and without obstruction 02/26/2008   GASTRITIS 02/26/2008   DUODENITIS 02/26/2008   MELENA 02/26/2008   NECK PAIN 02/26/2008   INSOMNIA 02/26/2008   SLEEP APNEA 02/26/2008   ABDOMINAL CRAMPS 02/26/2008   RECTAL BLEEDING, HX OF 02/26/2008     Health Maintenance Due  Topic Date Due   COVID-19 Vaccine (1) Never done   FOOT EXAM  Never done   OPHTHALMOLOGY EXAM  Never done   Diabetic kidney evaluation - Urine ACR  Never done   DTaP/Tdap/Td (1 - Tdap) Never done   Zoster Vaccines- Shingrix (1 of 2) Never done   Colonoscopy  Never done   Pneumonia Vaccine 41+ Years old (1 of 1 - PCV) Never done   INFLUENZA VACCINE  02/27/2023   Medicare Annual Wellness (AWV)  03/22/2023     Review of Systems + wound to thoracic wall. 12 point ros is otherwise negative Physical Exam   BP (!) 88/53   Pulse 68   Temp 97.7 F (36.5 C) (Temporal)   SpO2 97%   Gen = a x o by 3 in NAD HEENT = moist mucous membranes Skin = SEE PICS in MEDIA associated with visit   Media  Information    Left leg swelling - DVT + 2 pitting edema  CBC Lab Results  Component Value Date   WBC 5.9 02/26/2023   RBC 2.87 (L) 02/26/2023   HGB 8.4 (L) 02/26/2023   HCT 28.0 (L) 02/26/2023   PLT 135 (L) 02/26/2023   MCV 97.6 02/26/2023   MCH 29.3 02/26/2023   MCHC 30.0 02/26/2023   RDW 21.2 (H) 02/26/2023   LYMPHSABS 2.0 02/22/2023   MONOABS 0.7 02/22/2023   EOSABS 0.0 02/22/2023    BMET Lab Results  Component Value Date   NA 143 02/26/2023   K 3.6 02/26/2023   CL 114 (H) 02/26/2023   CO2 24 02/26/2023   GLUCOSE 62 (L) 02/26/2023   BUN 6 (L) 02/26/2023   CREATININE 0.69 02/26/2023   CALCIUM 7.7 (L) 02/26/2023   GFRNONAA >60 02/26/2023  GFRAA  05/09/2010    >60        The eGFR has been calculated using the MDRD equation. This calculation has not been validated in all clinical situations. eGFR's persistently <60 mL/min signify possible Chronic Kidney Disease.      Assessment and Plan Hypotension = currently asymptomatic but concern he is below his baseline. Gave verbal order to SNF  for ivf - 2L NS over 2-3 hrs  plus initiation of abtx.He is to go to ED for evaluation if SbP is 100 or <100. Verbal order to provider at SNF. Recommend to be evaluated by NP at SNF in the morning.  Mssa lumbar osteo = continue on cefadroxil 1000mg  po bid after he is finished with IV abtx for osteomyelits   Thoracic wall wound = still appears that it needs chemical debridement. Has wound care physician provision.  I have personally spent 40 minutes involved in face-to-face and non-face-to-face activities for this patient on the day of the visit. Professional time spent includes the following activities: Preparing to see the patient (review of tests), Obtaining and/or reviewing separately obtained history (admission/discharge record), Performing a medically appropriate examination and/or evaluation , Ordering medications/tests/procedures, referring and communicating with other  health care professionals, Documenting clinical information in the EMR, Independently interpreting results (not separately reported), Communicating results to the patient/family/caregiver, Counseling and educating the patient/family/caregiver and Care coordination (not separately reported).

## 2023-03-13 ENCOUNTER — Telehealth: Payer: Self-pay

## 2023-03-13 DIAGNOSIS — T8130XA Disruption of wound, unspecified, initial encounter: Secondary | ICD-10-CM | POA: Diagnosis not present

## 2023-03-13 NOTE — Telephone Encounter (Signed)
[facility] [phone]) to schedule patient with Dr. Daiva Eves the first week of September per Dr. Drue Second. No answer left vm.

## 2023-03-17 DIAGNOSIS — Z1621 Resistance to vancomycin: Secondary | ICD-10-CM | POA: Diagnosis not present

## 2023-03-17 DIAGNOSIS — I10 Essential (primary) hypertension: Secondary | ICD-10-CM | POA: Diagnosis not present

## 2023-03-17 NOTE — Telephone Encounter (Signed)
Called [facility] to schedule patient with transportation. No answer left voicemail.

## 2023-03-19 DIAGNOSIS — R5381 Other malaise: Secondary | ICD-10-CM | POA: Diagnosis not present

## 2023-03-19 DIAGNOSIS — L89624 Pressure ulcer of left heel, stage 4: Secondary | ICD-10-CM | POA: Diagnosis not present

## 2023-03-19 DIAGNOSIS — E119 Type 2 diabetes mellitus without complications: Secondary | ICD-10-CM | POA: Diagnosis not present

## 2023-03-19 DIAGNOSIS — L89124 Pressure ulcer of left upper back, stage 4: Secondary | ICD-10-CM | POA: Diagnosis not present

## 2023-03-19 DIAGNOSIS — I82402 Acute embolism and thrombosis of unspecified deep veins of left lower extremity: Secondary | ICD-10-CM | POA: Diagnosis not present

## 2023-03-19 DIAGNOSIS — I480 Paroxysmal atrial fibrillation: Secondary | ICD-10-CM | POA: Diagnosis not present

## 2023-03-21 DIAGNOSIS — I10 Essential (primary) hypertension: Secondary | ICD-10-CM | POA: Diagnosis not present

## 2023-03-25 DIAGNOSIS — I10 Essential (primary) hypertension: Secondary | ICD-10-CM | POA: Diagnosis not present

## 2023-03-30 DIAGNOSIS — B9561 Methicillin susceptible Staphylococcus aureus infection as the cause of diseases classified elsewhere: Secondary | ICD-10-CM | POA: Diagnosis not present

## 2023-03-31 DIAGNOSIS — B9561 Methicillin susceptible Staphylococcus aureus infection as the cause of diseases classified elsewhere: Secondary | ICD-10-CM | POA: Diagnosis not present

## 2023-04-01 ENCOUNTER — Ambulatory Visit (INDEPENDENT_AMBULATORY_CARE_PROVIDER_SITE_OTHER): Payer: Medicare HMO | Admitting: Internal Medicine

## 2023-04-01 ENCOUNTER — Other Ambulatory Visit: Payer: Self-pay

## 2023-04-01 ENCOUNTER — Encounter: Payer: Self-pay | Admitting: Internal Medicine

## 2023-04-01 VITALS — BP 92/56 | HR 58 | Temp 97.6°F

## 2023-04-01 DIAGNOSIS — T148XXA Other injury of unspecified body region, initial encounter: Secondary | ICD-10-CM | POA: Diagnosis not present

## 2023-04-01 DIAGNOSIS — A4901 Methicillin susceptible Staphylococcus aureus infection, unspecified site: Secondary | ICD-10-CM

## 2023-04-01 NOTE — Progress Notes (Signed)
Patient ID: Jim Ramirez, male   DOB: 1951-04-23, 72 y.o.   MRN: [id number]  HPI 72yo M with admission 11/2022 for disseminated mssa infection including ventriculitis, lumbar spine om, epidural Abscess, right hand abscess and 2nd digit om, negative TEE, S/P on 01/20/23 left L3-4, L4-5 and L5-S1 laminotomy/foraminotomies by dr Lovell Sheehan. Completed iv nafcillin --> Ancef on 02/18/2023 and was transitioned to p.o.cefadroxil 1000 mg bid. There was ID plan to do at least 10 months cefadroxil. Of note, no lumbar hardware was placed during lumbar spine surgery but he does have old cspine hardware that wasn't thought to be involved with the mssa infection  He las saw dr Drue Second 8/14. At that time reported that snf wound care physician placed new picc on 8/14 and give him vanc/cefepime/metronidazole for deep wound infection.   His blood pressure at the clinic 80s/50s so advised ivf at nursing home   04/01/23 id clinic fu He is f/u from snf with me today I reviewed mar No labs from snf He continues on vanc/cefepime. He also continues on cefadroxil at 500 mg bid  He is also given lasix for bilateral LE edema  Wound care has been taking care of left foot/right mid thoracic wound.   He is feeling well no n/v/diarrhea. His left foot is in mild pain   Outpatient Encounter Medications as of 04/01/2023  Medication Sig   amiodarone (PACERONE) 200 MG tablet Take 1 tablet (200 mg total) by mouth daily.   apixaban (ELIQUIS) 5 MG TABS tablet Take 1 tablet (5 mg total) by mouth 2 (two) times daily.   atorvastatin (LIPITOR) 40 MG tablet    azelastine (ASTELIN) 0.1 % nasal spray Place 1 spray into both nostrils 2 (two) times daily. Use in each nostril as directed (Patient not taking: Reported on 03/12/2023)   cefadroxil (DURICEF) 500 MG capsule Take 2 capsules (1,000 mg total) by mouth 2 (two) times daily. Continue for long term   cyanocobalamin (VITAMIN B12) 1000 MCG/ML injection Inject 1,000 mcg into the  muscle every 30 (thirty) days.   dapagliflozin propanediol (FARXIGA) 10 MG TABS tablet Take 10 mg by mouth daily.   diclofenac Sodium (VOLTAREN) 1 % GEL Apply 2 g topically 4 (four) times daily. (Patient not taking: Reported on 03/12/2023)   docusate sodium (COLACE) 100 MG capsule Take 1 capsule (100 mg total) by mouth 2 (two) times daily.   escitalopram (LEXAPRO) 10 MG tablet Take 10 mg by mouth daily.   esomeprazole (NEXIUM) 40 MG capsule Take 40 mg by mouth daily. (Patient not taking: Reported on 03/12/2023)   ferrous sulfate 325 (65 FE) MG tablet Take 1 tablet (325 mg total) by mouth daily with breakfast.   furosemide (LASIX) 20 MG tablet Take 1 tablet (20 mg total) by mouth daily.   gabapentin (NEURONTIN) 100 MG capsule Take 1 capsule (100 mg total) by mouth 2 (two) times daily.   hydrOXYzine (ATARAX) 25 MG tablet Take 25 mg by mouth 3 (three) times daily as needed.   insulin aspart (NOVOLOG) 100 UNIT/ML injection Inject 3 Units into the skin 3 (three) times daily with meals. If eats more than 50% of meals   insulin glargine-yfgn (SEMGLEE) 100 UNIT/ML injection Inject 0.04 mLs (4 Units total) into the skin at bedtime.   leptospermum manuka honey (MEDIHONEY) PSTE paste Apply 1 Application topically daily.   levocetirizine (XYZAL) 5 MG tablet Take 5 mg by mouth every evening. (Patient not taking: Reported on 03/12/2023)   metoprolol tartrate (LOPRESSOR)  25 MG tablet Take 0.5 tablets (12.5 mg total) by mouth 2 (two) times daily.   nystatin (MYCOSTATIN/NYSTOP) powder Apply 1 Application topically daily. For 10 days (Patient not taking: Reported on 03/12/2023)   Omeprazole 20 MG TBEC    oxyCODONE (OXY IR/ROXICODONE) 5 MG immediate release tablet Take 1 tablet (5 mg total) by mouth every 6 (six) hours as needed for moderate pain or severe pain.   potassium chloride SA (KLOR-CON M) 20 MEQ tablet Take 1 tablet (20 mEq total) by mouth daily.   risperiDONE (RISPERDAL) 0.5 MG tablet Take 1 tablet (0.5 mg  total) by mouth at bedtime. (Patient not taking: Reported on 03/12/2023)   rosuvastatin (CRESTOR) 20 MG tablet Take 20 mg by mouth daily. (Patient not taking: Reported on 03/12/2023)   tamsulosin (FLOMAX) 0.4 MG CAPS capsule Take 0.4 mg by mouth daily.   torsemide (DEMADEX) 20 MG tablet Take 20 mg by mouth daily. (Patient not taking: Reported on 03/12/2023)   No facility-administered encounter medications on file as of 04/01/2023.     Patient Active Problem List   Diagnosis Date Noted   Pressure injury of right thoracic region of back, stage 2 (HCC) 02/24/2023   Hypokalemia 02/22/2023   Paroxysmal atrial fibrillation with RVR (HCC) 02/22/2023   Malnutrition of moderate degree 01/14/2023   MSSA bacteremia 12/26/2022   New onset atrial fibrillation (HCC) 12/24/2022   Discitis of lumbar region 12/24/2022   Sepsis (HCC) 12/24/2022   BPH (benign prostatic hyperplasia) 12/24/2022   Mood disorder (HCC) 12/24/2022   Cervical spondylosis with myelopathy and radiculopathy 05/22/2022   Diabetes mellitus type 2 in nonobese (HCC) 02/26/2008   Dyslipidemia 02/26/2008   Essential hypertension 02/26/2008   INTERNAL HEMORRHOIDS 02/26/2008   EXTERNAL HEMORRHOIDS 02/26/2008   Duodenal ulcer without hemorrhage or perforation and without obstruction 02/26/2008   GASTRITIS 02/26/2008   DUODENITIS 02/26/2008   MELENA 02/26/2008   NECK PAIN 02/26/2008   INSOMNIA 02/26/2008   SLEEP APNEA 02/26/2008   ABDOMINAL CRAMPS 02/26/2008   RECTAL BLEEDING, HX OF 02/26/2008     Health Maintenance Due  Topic Date Due   COVID-19 Vaccine (1) Never done   FOOT EXAM  Never done   OPHTHALMOLOGY EXAM  Never done   Diabetic kidney evaluation - Urine ACR  Never done   DTaP/Tdap/Td (1 - Tdap) Never done   Zoster Vaccines- Shingrix (1 of 2) Never done   Colonoscopy  Never done   Pneumonia Vaccine 35+ Years old (1 of 1 - PCV) Never done   INFLUENZA VACCINE  02/27/2023   Medicare Annual Wellness (AWV)  03/22/2023      Review of Systems + wound to thoracic wall. 12 point ros is otherwise negative Physical Exam   BP (!) 92/56   Pulse (!) 58   Temp 97.6 F (36.4 C) (Temporal)   SpO2 99%   Gen = a x o by 3 in NAD HEENT = moist mucous membranes Skin = SEE PICS in MEDIA associated with visit   Media Information    Left leg swelling - DVT + 2 pitting edema     04/01/23     Left foot dressing clean/dry, in brace Right upper ext picc site nontender and no fluctuance  CBC Lab Results  Component Value Date   WBC 5.9 02/26/2023   RBC 2.87 (L) 02/26/2023   HGB 8.4 (L) 02/26/2023   HCT 28.0 (L) 02/26/2023   PLT 135 (L) 02/26/2023   MCV 97.6 02/26/2023   MCH 29.3 02/26/2023  MCHC 30.0 02/26/2023   RDW 21.2 (H) 02/26/2023   LYMPHSABS 2.0 02/22/2023   MONOABS 0.7 02/22/2023   EOSABS 0.0 02/22/2023    BMET Lab Results  Component Value Date   NA 143 02/26/2023   K 3.6 02/26/2023   CL 114 (H) 02/26/2023   CO2 24 02/26/2023   GLUCOSE 62 (L) 02/26/2023   BUN 6 (L) 02/26/2023   CREATININE 0.69 02/26/2023   CALCIUM 7.7 (L) 02/26/2023   GFRNONAA >60 02/26/2023   GFRAA  05/09/2010    >60        The eGFR has been calculated using the MDRD equation. This calculation has not been validated in all clinical situations. eGFR's persistently <60 mL/min signify possible Chronic Kidney Disease.      Assessment and Plan Hypotension = currently asymptomatic but concern he is below his baseline. Gave verbal order to SNF  for ivf - 2L NS over 2-3 hrs  plus initiation of abtx.He is to go to ED for evaluation if SbP is 100 or <100. Verbal order to provider at SNF. Recommend to be evaluated by NP at SNF in the morning.  Mssa lumbar osteo = continue on cefadroxil 1000mg  po bid after he is finished with IV abtx for osteomyelits   Thoracic wall wound = still appears that it needs chemical debridement. Has wound care physician provision.   ---------- 04/01/23 id clinic assessment Patient's bp  remains relatively low and he is not symptomatic Wound team at snf had started vanc/cefepime 8/14 and he continues on that. He remains on cefadroxil which I'll hold for now while on vanc/cefepime  I do not think with the back wound looking like it is he needs further vanc/cefepime but will defer the the wound care physician who is managing that along with opat labs. They are managing wounds as well which today 04/01/23 the back wound looks great  When he is done with the vanc/cefepime he can restart cefadroxil 1000 mg po bid again in accordance with prior ID plan. He does have old cspine hardware which at time of mssa infection 11/2022 wasn't though to be involved    ID f/u in 2-3 weeks

## 2023-04-01 NOTE — Patient Instructions (Signed)
Wound physician to manage vancomycin/cefepime    Stop cefadroxil while on above abx; when done with iv abx above, restart cefadroxil 1000 mg po bid   See id in 2-3 weeks

## 2023-04-02 DIAGNOSIS — E119 Type 2 diabetes mellitus without complications: Secondary | ICD-10-CM | POA: Diagnosis not present

## 2023-04-02 DIAGNOSIS — L89624 Pressure ulcer of left heel, stage 4: Secondary | ICD-10-CM | POA: Diagnosis not present

## 2023-04-02 DIAGNOSIS — E440 Moderate protein-calorie malnutrition: Secondary | ICD-10-CM | POA: Diagnosis not present

## 2023-04-02 DIAGNOSIS — M6281 Muscle weakness (generalized): Secondary | ICD-10-CM | POA: Diagnosis not present

## 2023-04-02 DIAGNOSIS — L89124 Pressure ulcer of left upper back, stage 4: Secondary | ICD-10-CM | POA: Diagnosis not present

## 2023-04-04 DIAGNOSIS — B9561 Methicillin susceptible Staphylococcus aureus infection as the cause of diseases classified elsewhere: Secondary | ICD-10-CM | POA: Diagnosis not present

## 2023-04-04 DIAGNOSIS — I10 Essential (primary) hypertension: Secondary | ICD-10-CM | POA: Diagnosis not present

## 2023-04-04 DIAGNOSIS — I509 Heart failure, unspecified: Secondary | ICD-10-CM | POA: Diagnosis not present

## 2023-04-05 DIAGNOSIS — I130 Hypertensive heart and chronic kidney disease with heart failure and stage 1 through stage 4 chronic kidney disease, or unspecified chronic kidney disease: Secondary | ICD-10-CM | POA: Diagnosis not present

## 2023-04-09 DIAGNOSIS — E440 Moderate protein-calorie malnutrition: Secondary | ICD-10-CM | POA: Diagnosis not present

## 2023-04-09 DIAGNOSIS — F419 Anxiety disorder, unspecified: Secondary | ICD-10-CM | POA: Diagnosis not present

## 2023-04-09 DIAGNOSIS — E119 Type 2 diabetes mellitus without complications: Secondary | ICD-10-CM | POA: Diagnosis not present

## 2023-04-09 DIAGNOSIS — F339 Major depressive disorder, recurrent, unspecified: Secondary | ICD-10-CM | POA: Diagnosis not present

## 2023-04-09 DIAGNOSIS — L89124 Pressure ulcer of left upper back, stage 4: Secondary | ICD-10-CM | POA: Diagnosis not present

## 2023-04-09 DIAGNOSIS — L89616 Pressure-induced deep tissue damage of right heel: Secondary | ICD-10-CM | POA: Diagnosis not present

## 2023-04-09 DIAGNOSIS — M6281 Muscle weakness (generalized): Secondary | ICD-10-CM | POA: Diagnosis not present

## 2023-04-09 DIAGNOSIS — L89624 Pressure ulcer of left heel, stage 4: Secondary | ICD-10-CM | POA: Diagnosis not present

## 2023-04-10 DIAGNOSIS — T8130XA Disruption of wound, unspecified, initial encounter: Secondary | ICD-10-CM | POA: Diagnosis not present

## 2023-04-10 DIAGNOSIS — E7800 Pure hypercholesterolemia, unspecified: Secondary | ICD-10-CM | POA: Diagnosis not present

## 2023-04-10 DIAGNOSIS — I10 Essential (primary) hypertension: Secondary | ICD-10-CM | POA: Diagnosis not present

## 2023-04-10 DIAGNOSIS — R51 Headache with orthostatic component, not elsewhere classified: Secondary | ICD-10-CM | POA: Diagnosis not present

## 2023-04-14 DIAGNOSIS — I480 Paroxysmal atrial fibrillation: Secondary | ICD-10-CM | POA: Diagnosis not present

## 2023-04-14 DIAGNOSIS — I82402 Acute embolism and thrombosis of unspecified deep veins of left lower extremity: Secondary | ICD-10-CM | POA: Diagnosis not present

## 2023-04-14 DIAGNOSIS — E119 Type 2 diabetes mellitus without complications: Secondary | ICD-10-CM | POA: Diagnosis not present

## 2023-04-14 DIAGNOSIS — E860 Dehydration: Secondary | ICD-10-CM | POA: Diagnosis not present

## 2023-04-14 DIAGNOSIS — R5381 Other malaise: Secondary | ICD-10-CM | POA: Diagnosis not present

## 2023-04-14 DIAGNOSIS — E876 Hypokalemia: Secondary | ICD-10-CM | POA: Diagnosis not present

## 2023-04-16 DIAGNOSIS — N390 Urinary tract infection, site not specified: Secondary | ICD-10-CM | POA: Diagnosis not present

## 2023-04-16 DIAGNOSIS — L89613 Pressure ulcer of right heel, stage 3: Secondary | ICD-10-CM | POA: Diagnosis not present

## 2023-04-16 DIAGNOSIS — L89124 Pressure ulcer of left upper back, stage 4: Secondary | ICD-10-CM | POA: Diagnosis not present

## 2023-04-16 DIAGNOSIS — L89624 Pressure ulcer of left heel, stage 4: Secondary | ICD-10-CM | POA: Diagnosis not present

## 2023-04-16 DIAGNOSIS — E119 Type 2 diabetes mellitus without complications: Secondary | ICD-10-CM | POA: Diagnosis not present

## 2023-04-16 DIAGNOSIS — M6281 Muscle weakness (generalized): Secondary | ICD-10-CM | POA: Diagnosis not present

## 2023-04-16 DIAGNOSIS — E440 Moderate protein-calorie malnutrition: Secondary | ICD-10-CM | POA: Diagnosis not present

## 2023-04-21 DIAGNOSIS — J984 Other disorders of lung: Secondary | ICD-10-CM | POA: Diagnosis not present

## 2023-04-21 DIAGNOSIS — R600 Localized edema: Secondary | ICD-10-CM | POA: Diagnosis not present

## 2023-04-21 DIAGNOSIS — I509 Heart failure, unspecified: Secondary | ICD-10-CM | POA: Diagnosis not present

## 2023-04-22 DIAGNOSIS — J189 Pneumonia, unspecified organism: Secondary | ICD-10-CM | POA: Diagnosis not present

## 2023-04-23 DIAGNOSIS — E119 Type 2 diabetes mellitus without complications: Secondary | ICD-10-CM | POA: Diagnosis not present

## 2023-04-23 DIAGNOSIS — M6281 Muscle weakness (generalized): Secondary | ICD-10-CM | POA: Diagnosis not present

## 2023-04-23 DIAGNOSIS — D649 Anemia, unspecified: Secondary | ICD-10-CM | POA: Diagnosis not present

## 2023-04-23 DIAGNOSIS — I509 Heart failure, unspecified: Secondary | ICD-10-CM | POA: Diagnosis not present

## 2023-04-23 DIAGNOSIS — E440 Moderate protein-calorie malnutrition: Secondary | ICD-10-CM | POA: Diagnosis not present

## 2023-04-23 DIAGNOSIS — G9341 Metabolic encephalopathy: Secondary | ICD-10-CM | POA: Diagnosis not present

## 2023-04-23 DIAGNOSIS — R600 Localized edema: Secondary | ICD-10-CM | POA: Diagnosis not present

## 2023-04-23 DIAGNOSIS — R918 Other nonspecific abnormal finding of lung field: Secondary | ICD-10-CM | POA: Diagnosis not present

## 2023-04-23 DIAGNOSIS — R4182 Altered mental status, unspecified: Secondary | ICD-10-CM | POA: Diagnosis not present

## 2023-04-23 DIAGNOSIS — L89624 Pressure ulcer of left heel, stage 4: Secondary | ICD-10-CM | POA: Diagnosis not present

## 2023-04-23 DIAGNOSIS — R41841 Cognitive communication deficit: Secondary | ICD-10-CM | POA: Diagnosis not present

## 2023-04-23 DIAGNOSIS — L89124 Pressure ulcer of left upper back, stage 4: Secondary | ICD-10-CM | POA: Diagnosis not present

## 2023-04-24 DIAGNOSIS — M6281 Muscle weakness (generalized): Secondary | ICD-10-CM | POA: Diagnosis not present

## 2023-04-24 DIAGNOSIS — E119 Type 2 diabetes mellitus without complications: Secondary | ICD-10-CM | POA: Diagnosis not present

## 2023-04-24 DIAGNOSIS — R41841 Cognitive communication deficit: Secondary | ICD-10-CM | POA: Diagnosis not present

## 2023-04-24 DIAGNOSIS — R4182 Altered mental status, unspecified: Secondary | ICD-10-CM | POA: Diagnosis not present

## 2023-04-24 DIAGNOSIS — R600 Localized edema: Secondary | ICD-10-CM | POA: Diagnosis not present

## 2023-04-24 DIAGNOSIS — R918 Other nonspecific abnormal finding of lung field: Secondary | ICD-10-CM | POA: Diagnosis not present

## 2023-04-24 DIAGNOSIS — T8130XA Disruption of wound, unspecified, initial encounter: Secondary | ICD-10-CM | POA: Diagnosis not present

## 2023-04-25 ENCOUNTER — Other Ambulatory Visit: Payer: Self-pay

## 2023-04-25 ENCOUNTER — Encounter: Payer: Self-pay | Admitting: Internal Medicine

## 2023-04-25 ENCOUNTER — Ambulatory Visit (INDEPENDENT_AMBULATORY_CARE_PROVIDER_SITE_OTHER): Payer: Medicare HMO | Admitting: Internal Medicine

## 2023-04-25 VITALS — BP 94/66 | HR 81 | Resp 16 | Ht 72.0 in | Wt 187.0 lb

## 2023-04-25 DIAGNOSIS — Z96649 Presence of unspecified artificial hip joint: Secondary | ICD-10-CM

## 2023-04-25 DIAGNOSIS — M4626 Osteomyelitis of vertebra, lumbar region: Secondary | ICD-10-CM | POA: Diagnosis not present

## 2023-04-25 DIAGNOSIS — A4901 Methicillin susceptible Staphylococcus aureus infection, unspecified site: Secondary | ICD-10-CM | POA: Diagnosis not present

## 2023-04-25 NOTE — Progress Notes (Signed)
Patient ID: Jim Ramirez, male   DOB: 07/24/1951, 72 y.o.   MRN: [id number]  HPI 72yo M with admission 11/2022 for disseminated mssa infection including ventriculitis, lumbar spine om, epidural Abscess, right hand abscess and 2nd digit om, negative TEE, S/P on 01/20/23 left L3-4, L4-5 and L5-S1 laminotomy/foraminotomies by dr Lovell Sheehan. Completed iv nafcillin --> Ancef on 02/18/2023 and was transitioned to p.o.cefadroxil 1000 mg bid. There was ID plan to do at least 10 months cefadroxil. Of note, no lumbar hardware was placed during lumbar spine surgery but he does have old cspine hardware that wasn't thought to be involved with the mssa infection  He las saw dr Drue Second 8/14. At that time reported that snf wound care physician placed new picc on 8/14 and give him vanc/cefepime/metronidazole for deep wound infection.   His blood pressure at the clinic 80s/50s so advised ivf at nursing home   04/01/23 id clinic fu He is f/u from snf with me today I reviewed mar No labs from snf He continues on vanc/cefepime. He also continues on cefadroxil at 500 mg bid  He is also given lasix for bilateral LE edema  Wound care has been taking care of left foot/right mid thoracic wound.   He is feeling well no n/v/diarrhea. His left foot is in mild pain   04/25/23 id clinic f/u See a&p for detail He is in a wheel chair   Outpatient Encounter Medications as of 04/25/2023  Medication Sig   amiodarone (PACERONE) 200 MG tablet Take 1 tablet (200 mg total) by mouth daily.   apixaban (ELIQUIS) 5 MG TABS tablet Take 1 tablet (5 mg total) by mouth 2 (two) times daily.   atorvastatin (LIPITOR) 40 MG tablet    cefadroxil (DURICEF) 500 MG capsule Take 2 capsules (1,000 mg total) by mouth 2 (two) times daily. Continue for long term   cyanocobalamin (VITAMIN B12) 1000 MCG/ML injection Inject 1,000 mcg into the muscle every 30 (thirty) days.   dapagliflozin propanediol (FARXIGA) 10 MG TABS tablet Take 10 mg by  mouth daily.   diclofenac Sodium (VOLTAREN) 1 % GEL Apply 2 g topically 4 (four) times daily.   docusate sodium (COLACE) 100 MG capsule Take 1 capsule (100 mg total) by mouth 2 (two) times daily.   escitalopram (LEXAPRO) 10 MG tablet Take 10 mg by mouth daily.   esomeprazole (NEXIUM) 40 MG capsule Take 40 mg by mouth daily.   ferrous sulfate 325 (65 FE) MG tablet Take 1 tablet (325 mg total) by mouth daily with breakfast.   furosemide (LASIX) 20 MG tablet Take 1 tablet (20 mg total) by mouth daily.   gabapentin (NEURONTIN) 100 MG capsule Take 1 capsule (100 mg total) by mouth 2 (two) times daily.   hydrOXYzine (ATARAX) 25 MG tablet Take 25 mg by mouth 3 (three) times daily as needed.   insulin aspart (NOVOLOG) 100 UNIT/ML injection Inject 3 Units into the skin 3 (three) times daily with meals. If eats more than 50% of meals   insulin glargine-yfgn (SEMGLEE) 100 UNIT/ML injection Inject 0.04 mLs (4 Units total) into the skin at bedtime.   leptospermum manuka honey (MEDIHONEY) PSTE paste Apply 1 Application topically daily.   levocetirizine (XYZAL) 5 MG tablet Take 5 mg by mouth every evening.   metoprolol tartrate (LOPRESSOR) 25 MG tablet Take 0.5 tablets (12.5 mg total) by mouth 2 (two) times daily.   nystatin (MYCOSTATIN/NYSTOP) powder Apply 1 Application topically daily. For 10 days   Omeprazole 20  MG TBEC    oxyCODONE (OXY IR/ROXICODONE) 5 MG immediate release tablet Take 1 tablet (5 mg total) by mouth every 6 (six) hours as needed for moderate pain or severe pain.   potassium chloride SA (KLOR-CON M) 20 MEQ tablet Take 1 tablet (20 mEq total) by mouth daily.   risperiDONE (RISPERDAL) 0.5 MG tablet Take 1 tablet (0.5 mg total) by mouth at bedtime.   rosuvastatin (CRESTOR) 20 MG tablet Take 20 mg by mouth daily.   SANTYL 250 UNIT/GM ointment Apply topically.   tamsulosin (FLOMAX) 0.4 MG CAPS capsule Take 0.4 mg by mouth daily.   torsemide (DEMADEX) 20 MG tablet Take 20 mg by mouth daily.    azelastine (ASTELIN) 0.1 % nasal spray Place 1 spray into both nostrils 2 (two) times daily. Use in each nostril as directed (Patient not taking: Reported on 03/12/2023)   No facility-administered encounter medications on file as of 04/25/2023.     Patient Active Problem List   Diagnosis Date Noted   Pressure injury of right thoracic region of back, stage 2 (HCC) 02/24/2023   Hypokalemia 02/22/2023   Paroxysmal atrial fibrillation with RVR (HCC) 02/22/2023   Malnutrition of moderate degree 01/14/2023   MSSA bacteremia 12/26/2022   New onset atrial fibrillation (HCC) 12/24/2022   Discitis of lumbar region 12/24/2022   Sepsis (HCC) 12/24/2022   BPH (benign prostatic hyperplasia) 12/24/2022   Mood disorder (HCC) 12/24/2022   Cervical spondylosis with myelopathy and radiculopathy 05/22/2022   Diabetes mellitus type 2 in nonobese (HCC) 02/26/2008   Dyslipidemia 02/26/2008   Essential hypertension 02/26/2008   INTERNAL HEMORRHOIDS 02/26/2008   EXTERNAL HEMORRHOIDS 02/26/2008   Duodenal ulcer without hemorrhage or perforation and without obstruction 02/26/2008   GASTRITIS 02/26/2008   DUODENITIS 02/26/2008   MELENA 02/26/2008   NECK PAIN 02/26/2008   INSOMNIA 02/26/2008   SLEEP APNEA 02/26/2008   ABDOMINAL CRAMPS 02/26/2008   RECTAL BLEEDING, HX OF 02/26/2008     Health Maintenance Due  Topic Date Due   COVID-19 Vaccine (1) Never done   FOOT EXAM  Never done   OPHTHALMOLOGY EXAM  Never done   Diabetic kidney evaluation - Urine ACR  Never done   Zoster Vaccines- Shingrix (1 of 2) Never done   DTaP/Tdap/Td (1 - Tdap) 03/31/1997   Colonoscopy  Never done   Pneumonia Vaccine 30+ Years old (1 of 1 - PCV) Never done   INFLUENZA VACCINE  02/27/2023   Medicare Annual Wellness (AWV)  03/22/2023     Review of Systems + wound to thoracic wall. 12 point ros is otherwise negative Physical Exam   BP 94/66   Pulse 81   Resp 16   Ht 6' (1.829 m)   Wt 187 lb (84.8 kg)   BMI 25.36  kg/m   Gen = a x o by 3 in NAD HEENT = moist mucous membranes Skin = SEE PICS in MEDIA associated with visit   Media Information    Left leg swelling - DVT + 2 pitting edema     04/01/23      04/25/23    Right foot dressing clean/dry; left foot in brace No picc present  CBC Lab Results  Component Value Date   WBC 5.9 02/26/2023   RBC 2.87 (L) 02/26/2023   HGB 8.4 (L) 02/26/2023   HCT 28.0 (L) 02/26/2023   PLT 135 (L) 02/26/2023   MCV 97.6 02/26/2023   MCH 29.3 02/26/2023   MCHC 30.0 02/26/2023   RDW 21.2 (H)  02/26/2023   LYMPHSABS 2.0 02/22/2023   MONOABS 0.7 02/22/2023   EOSABS 0.0 02/22/2023    BMET Lab Results  Component Value Date   NA 143 02/26/2023   K 3.6 02/26/2023   CL 114 (H) 02/26/2023   CO2 24 02/26/2023   GLUCOSE 62 (L) 02/26/2023   BUN 6 (L) 02/26/2023   CREATININE 0.69 02/26/2023   CALCIUM 7.7 (L) 02/26/2023   GFRNONAA >60 02/26/2023   GFRAA  05/09/2010    >60        The eGFR has been calculated using the MDRD equation. This calculation has not been validated in all clinical situations. eGFR's persistently <60 mL/min signify possible Chronic Kidney Disease.      Assessment and Plan Hypotension = currently asymptomatic but concern he is below his baseline. Gave verbal order to SNF  for ivf - 2L NS over 2-3 hrs  plus initiation of abtx.He is to go to ED for evaluation if SbP is 100 or <100. Verbal order to provider at SNF. Recommend to be evaluated by NP at SNF in the morning.  Mssa lumbar osteo = continue on cefadroxil 1000mg  po bid after he is finished with IV abtx for osteomyelits   Thoracic wall wound = still appears that it needs chemical debridement. Has wound care physician provision.   ---------- 04/01/23 id clinic assessment Patient's bp remains relatively low and he is not symptomatic Wound team at snf had started vanc/cefepime 8/14 and he continues on that. He remains on cefadroxil which I'll hold for now while on  vanc/cefepime  I do not think with the back wound looking like it is he needs further vanc/cefepime but will defer the the wound care physician who is managing that along with opat labs. They are managing wounds as well which today 04/01/23 the back wound looks great  When he is done with the vanc/cefepime he can restart cefadroxil 1000 mg po bid again in accordance with prior ID plan. He does have old cspine hardware which at time of mssa infection 11/2022 wasn't though to be involved    ID f/u in 2-3 weeks   04/25/23 id clinic assessment No complaint today Getting wound care for the back wound   He has chronic wound bilateral feet that haven't been infected and didn't examine today  Patient is back on cefadroxil 1000 mg po bid starting 9/13; iv abx stopped; picc not present Snf labs provided today: 04/24/23 cbc 5.8/10/189; prealbumin 7 04/05/23 cr 0.8  Encourage supplemental boost three times a day with meals to improve nutrition Continue wound care   F/u dr Drue Second in 8-10 weeks for routine care/abx monitoring

## 2023-04-25 NOTE — Patient Instructions (Signed)
Continue cefadroxil 1000 mg po bid; for now indefinitely   Follow up with dr Judyann Munson in 8 weeks

## 2023-04-29 DIAGNOSIS — R5381 Other malaise: Secondary | ICD-10-CM | POA: Diagnosis not present

## 2023-04-29 DIAGNOSIS — I509 Heart failure, unspecified: Secondary | ICD-10-CM | POA: Diagnosis not present

## 2023-04-29 DIAGNOSIS — R600 Localized edema: Secondary | ICD-10-CM | POA: Diagnosis not present

## 2023-04-29 DIAGNOSIS — R41841 Cognitive communication deficit: Secondary | ICD-10-CM | POA: Diagnosis not present

## 2023-04-29 DIAGNOSIS — R4182 Altered mental status, unspecified: Secondary | ICD-10-CM | POA: Diagnosis not present

## 2023-04-29 DIAGNOSIS — E119 Type 2 diabetes mellitus without complications: Secondary | ICD-10-CM | POA: Diagnosis not present

## 2023-04-30 DIAGNOSIS — F419 Anxiety disorder, unspecified: Secondary | ICD-10-CM | POA: Diagnosis not present

## 2023-04-30 DIAGNOSIS — F339 Major depressive disorder, recurrent, unspecified: Secondary | ICD-10-CM | POA: Diagnosis not present

## 2023-05-01 DIAGNOSIS — Z87891 Personal history of nicotine dependence: Secondary | ICD-10-CM | POA: Diagnosis not present

## 2023-05-01 DIAGNOSIS — R7401 Elevation of levels of liver transaminase levels: Secondary | ICD-10-CM | POA: Diagnosis not present

## 2023-05-01 DIAGNOSIS — R262 Difficulty in walking, not elsewhere classified: Secondary | ICD-10-CM | POA: Diagnosis not present

## 2023-05-01 DIAGNOSIS — Z792 Long term (current) use of antibiotics: Secondary | ICD-10-CM | POA: Diagnosis not present

## 2023-05-01 DIAGNOSIS — I10 Essential (primary) hypertension: Secondary | ICD-10-CM | POA: Diagnosis not present

## 2023-05-01 DIAGNOSIS — L89623 Pressure ulcer of left heel, stage 3: Secondary | ICD-10-CM | POA: Diagnosis not present

## 2023-05-01 DIAGNOSIS — R4182 Altered mental status, unspecified: Secondary | ICD-10-CM | POA: Diagnosis not present

## 2023-05-01 DIAGNOSIS — E871 Hypo-osmolality and hyponatremia: Secondary | ICD-10-CM | POA: Diagnosis not present

## 2023-05-01 DIAGNOSIS — I959 Hypotension, unspecified: Secondary | ICD-10-CM | POA: Diagnosis not present

## 2023-05-01 DIAGNOSIS — L89619 Pressure ulcer of right heel, unspecified stage: Secondary | ICD-10-CM | POA: Diagnosis not present

## 2023-05-01 DIAGNOSIS — E785 Hyperlipidemia, unspecified: Secondary | ICD-10-CM | POA: Diagnosis not present

## 2023-05-01 DIAGNOSIS — L97421 Non-pressure chronic ulcer of left heel and midfoot limited to breakdown of skin: Secondary | ICD-10-CM | POA: Insufficient documentation

## 2023-05-01 DIAGNOSIS — J181 Lobar pneumonia, unspecified organism: Secondary | ICD-10-CM | POA: Diagnosis not present

## 2023-05-01 DIAGNOSIS — B9561 Methicillin susceptible Staphylococcus aureus infection as the cause of diseases classified elsewhere: Secondary | ICD-10-CM | POA: Diagnosis not present

## 2023-05-01 DIAGNOSIS — F419 Anxiety disorder, unspecified: Secondary | ICD-10-CM | POA: Diagnosis not present

## 2023-05-01 DIAGNOSIS — G4700 Insomnia, unspecified: Secondary | ICD-10-CM | POA: Diagnosis not present

## 2023-05-01 DIAGNOSIS — R600 Localized edema: Secondary | ICD-10-CM | POA: Diagnosis not present

## 2023-05-01 DIAGNOSIS — Z794 Long term (current) use of insulin: Secondary | ICD-10-CM | POA: Diagnosis not present

## 2023-05-01 DIAGNOSIS — K219 Gastro-esophageal reflux disease without esophagitis: Secondary | ICD-10-CM | POA: Diagnosis not present

## 2023-05-01 DIAGNOSIS — J984 Other disorders of lung: Secondary | ICD-10-CM | POA: Diagnosis not present

## 2023-05-01 DIAGNOSIS — J440 Chronic obstructive pulmonary disease with acute lower respiratory infection: Secondary | ICD-10-CM | POA: Diagnosis not present

## 2023-05-01 DIAGNOSIS — Z79899 Other long term (current) drug therapy: Secondary | ICD-10-CM | POA: Diagnosis not present

## 2023-05-01 DIAGNOSIS — I82402 Acute embolism and thrombosis of unspecified deep veins of left lower extremity: Secondary | ICD-10-CM | POA: Diagnosis not present

## 2023-05-01 DIAGNOSIS — Z1152 Encounter for screening for COVID-19: Secondary | ICD-10-CM | POA: Diagnosis not present

## 2023-05-01 DIAGNOSIS — Z20822 Contact with and (suspected) exposure to covid-19: Secondary | ICD-10-CM | POA: Diagnosis not present

## 2023-05-01 DIAGNOSIS — L89113 Pressure ulcer of right upper back, stage 3: Secondary | ICD-10-CM | POA: Diagnosis not present

## 2023-05-01 DIAGNOSIS — M6259 Muscle wasting and atrophy, not elsewhere classified, multiple sites: Secondary | ICD-10-CM | POA: Diagnosis not present

## 2023-05-01 DIAGNOSIS — R4701 Aphasia: Secondary | ICD-10-CM | POA: Diagnosis not present

## 2023-05-01 DIAGNOSIS — B965 Pseudomonas (aeruginosa) (mallei) (pseudomallei) as the cause of diseases classified elsewhere: Secondary | ICD-10-CM | POA: Diagnosis not present

## 2023-05-01 DIAGNOSIS — Z635 Disruption of family by separation and divorce: Secondary | ICD-10-CM | POA: Diagnosis not present

## 2023-05-01 DIAGNOSIS — R918 Other nonspecific abnormal finding of lung field: Secondary | ICD-10-CM | POA: Diagnosis not present

## 2023-05-01 DIAGNOSIS — F339 Major depressive disorder, recurrent, unspecified: Secondary | ICD-10-CM | POA: Diagnosis not present

## 2023-05-01 DIAGNOSIS — R1312 Dysphagia, oropharyngeal phase: Secondary | ICD-10-CM | POA: Diagnosis not present

## 2023-05-01 DIAGNOSIS — M86172 Other acute osteomyelitis, left ankle and foot: Secondary | ICD-10-CM | POA: Diagnosis not present

## 2023-05-01 DIAGNOSIS — L97419 Non-pressure chronic ulcer of right heel and midfoot with unspecified severity: Secondary | ICD-10-CM | POA: Diagnosis not present

## 2023-05-01 DIAGNOSIS — R5381 Other malaise: Secondary | ICD-10-CM | POA: Diagnosis not present

## 2023-05-01 DIAGNOSIS — L89134 Pressure ulcer of right lower back, stage 4: Secondary | ICD-10-CM | POA: Diagnosis not present

## 2023-05-01 DIAGNOSIS — R001 Bradycardia, unspecified: Secondary | ICD-10-CM | POA: Diagnosis not present

## 2023-05-01 DIAGNOSIS — J189 Pneumonia, unspecified organism: Secondary | ICD-10-CM | POA: Diagnosis not present

## 2023-05-01 DIAGNOSIS — L89899 Pressure ulcer of other site, unspecified stage: Secondary | ICD-10-CM | POA: Diagnosis not present

## 2023-05-01 DIAGNOSIS — M6281 Muscle weakness (generalized): Secondary | ICD-10-CM | POA: Diagnosis not present

## 2023-05-01 DIAGNOSIS — G9341 Metabolic encephalopathy: Secondary | ICD-10-CM | POA: Diagnosis not present

## 2023-05-01 DIAGNOSIS — L97519 Non-pressure chronic ulcer of other part of right foot with unspecified severity: Secondary | ICD-10-CM | POA: Diagnosis not present

## 2023-05-01 DIAGNOSIS — R197 Diarrhea, unspecified: Secondary | ICD-10-CM | POA: Diagnosis not present

## 2023-05-01 DIAGNOSIS — L89144 Pressure ulcer of left lower back, stage 4: Secondary | ICD-10-CM | POA: Diagnosis not present

## 2023-05-01 DIAGNOSIS — E876 Hypokalemia: Secondary | ICD-10-CM | POA: Diagnosis not present

## 2023-05-01 DIAGNOSIS — E119 Type 2 diabetes mellitus without complications: Secondary | ICD-10-CM | POA: Diagnosis not present

## 2023-05-01 DIAGNOSIS — R7981 Abnormal blood-gas level: Secondary | ICD-10-CM | POA: Diagnosis not present

## 2023-05-01 DIAGNOSIS — N400 Enlarged prostate without lower urinary tract symptoms: Secondary | ICD-10-CM | POA: Diagnosis not present

## 2023-05-01 DIAGNOSIS — I672 Cerebral atherosclerosis: Secondary | ICD-10-CM | POA: Diagnosis not present

## 2023-05-01 DIAGNOSIS — G8929 Other chronic pain: Secondary | ICD-10-CM | POA: Diagnosis not present

## 2023-05-01 DIAGNOSIS — R531 Weakness: Secondary | ICD-10-CM | POA: Diagnosis not present

## 2023-05-01 DIAGNOSIS — Z743 Need for continuous supervision: Secondary | ICD-10-CM | POA: Diagnosis not present

## 2023-05-01 DIAGNOSIS — D649 Anemia, unspecified: Secondary | ICD-10-CM | POA: Diagnosis not present

## 2023-05-01 DIAGNOSIS — E440 Moderate protein-calorie malnutrition: Secondary | ICD-10-CM | POA: Diagnosis not present

## 2023-05-01 DIAGNOSIS — R29818 Other symptoms and signs involving the nervous system: Secondary | ICD-10-CM | POA: Diagnosis not present

## 2023-05-01 DIAGNOSIS — Z23 Encounter for immunization: Secondary | ICD-10-CM | POA: Diagnosis not present

## 2023-05-01 DIAGNOSIS — E870 Hyperosmolality and hypernatremia: Secondary | ICD-10-CM | POA: Diagnosis not present

## 2023-05-01 DIAGNOSIS — I480 Paroxysmal atrial fibrillation: Secondary | ICD-10-CM | POA: Diagnosis not present

## 2023-05-01 DIAGNOSIS — R0600 Dyspnea, unspecified: Secondary | ICD-10-CM | POA: Diagnosis not present

## 2023-05-01 DIAGNOSIS — I509 Heart failure, unspecified: Secondary | ICD-10-CM | POA: Diagnosis not present

## 2023-05-01 DIAGNOSIS — L97529 Non-pressure chronic ulcer of other part of left foot with unspecified severity: Secondary | ICD-10-CM | POA: Diagnosis not present

## 2023-05-01 DIAGNOSIS — R41841 Cognitive communication deficit: Secondary | ICD-10-CM | POA: Diagnosis not present

## 2023-05-01 DIAGNOSIS — E7800 Pure hypercholesterolemia, unspecified: Secondary | ICD-10-CM | POA: Diagnosis not present

## 2023-05-01 DIAGNOSIS — L89612 Pressure ulcer of right heel, stage 2: Secondary | ICD-10-CM | POA: Diagnosis not present

## 2023-05-05 DIAGNOSIS — R5381 Other malaise: Secondary | ICD-10-CM | POA: Diagnosis not present

## 2023-05-05 DIAGNOSIS — J189 Pneumonia, unspecified organism: Secondary | ICD-10-CM | POA: Diagnosis not present

## 2023-05-07 DIAGNOSIS — D649 Anemia, unspecified: Secondary | ICD-10-CM | POA: Diagnosis not present

## 2023-05-07 DIAGNOSIS — E440 Moderate protein-calorie malnutrition: Secondary | ICD-10-CM | POA: Diagnosis not present

## 2023-05-07 DIAGNOSIS — L89124 Pressure ulcer of left upper back, stage 4: Secondary | ICD-10-CM | POA: Diagnosis not present

## 2023-05-07 DIAGNOSIS — L89624 Pressure ulcer of left heel, stage 4: Secondary | ICD-10-CM | POA: Diagnosis not present

## 2023-05-07 DIAGNOSIS — R5381 Other malaise: Secondary | ICD-10-CM | POA: Diagnosis not present

## 2023-05-07 DIAGNOSIS — M6281 Muscle weakness (generalized): Secondary | ICD-10-CM | POA: Diagnosis not present

## 2023-05-07 DIAGNOSIS — J189 Pneumonia, unspecified organism: Secondary | ICD-10-CM | POA: Diagnosis not present

## 2023-05-07 DIAGNOSIS — F419 Anxiety disorder, unspecified: Secondary | ICD-10-CM | POA: Diagnosis not present

## 2023-05-07 DIAGNOSIS — F339 Major depressive disorder, recurrent, unspecified: Secondary | ICD-10-CM | POA: Diagnosis not present

## 2023-05-07 DIAGNOSIS — L98499 Non-pressure chronic ulcer of skin of other sites with unspecified severity: Secondary | ICD-10-CM | POA: Diagnosis not present

## 2023-05-07 DIAGNOSIS — E119 Type 2 diabetes mellitus without complications: Secondary | ICD-10-CM | POA: Diagnosis not present

## 2023-05-07 DIAGNOSIS — E876 Hypokalemia: Secondary | ICD-10-CM | POA: Diagnosis not present

## 2023-05-08 DIAGNOSIS — R5381 Other malaise: Secondary | ICD-10-CM | POA: Diagnosis not present

## 2023-05-08 DIAGNOSIS — R531 Weakness: Secondary | ICD-10-CM | POA: Diagnosis not present

## 2023-05-08 DIAGNOSIS — L89124 Pressure ulcer of left upper back, stage 4: Secondary | ICD-10-CM | POA: Diagnosis not present

## 2023-05-08 DIAGNOSIS — L89603 Pressure ulcer of unspecified heel, stage 3: Secondary | ICD-10-CM | POA: Diagnosis not present

## 2023-05-08 DIAGNOSIS — E860 Dehydration: Secondary | ICD-10-CM | POA: Diagnosis not present

## 2023-05-08 DIAGNOSIS — R262 Difficulty in walking, not elsewhere classified: Secondary | ICD-10-CM | POA: Diagnosis not present

## 2023-05-08 DIAGNOSIS — I959 Hypotension, unspecified: Secondary | ICD-10-CM | POA: Diagnosis not present

## 2023-05-08 DIAGNOSIS — J181 Lobar pneumonia, unspecified organism: Secondary | ICD-10-CM | POA: Diagnosis not present

## 2023-05-08 DIAGNOSIS — R9389 Abnormal findings on diagnostic imaging of other specified body structures: Secondary | ICD-10-CM | POA: Diagnosis not present

## 2023-05-08 DIAGNOSIS — K8500 Idiopathic acute pancreatitis without necrosis or infection: Secondary | ICD-10-CM | POA: Diagnosis not present

## 2023-05-08 DIAGNOSIS — R1031 Right lower quadrant pain: Secondary | ICD-10-CM | POA: Diagnosis not present

## 2023-05-08 DIAGNOSIS — Z86718 Personal history of other venous thrombosis and embolism: Secondary | ICD-10-CM | POA: Diagnosis not present

## 2023-05-08 DIAGNOSIS — B372 Candidiasis of skin and nail: Secondary | ICD-10-CM | POA: Diagnosis not present

## 2023-05-08 DIAGNOSIS — N400 Enlarged prostate without lower urinary tract symptoms: Secondary | ICD-10-CM | POA: Diagnosis not present

## 2023-05-08 DIAGNOSIS — K2980 Duodenitis without bleeding: Secondary | ICD-10-CM | POA: Diagnosis not present

## 2023-05-08 DIAGNOSIS — E1169 Type 2 diabetes mellitus with other specified complication: Secondary | ICD-10-CM | POA: Diagnosis not present

## 2023-05-08 DIAGNOSIS — R140 Abdominal distension (gaseous): Secondary | ICD-10-CM | POA: Diagnosis not present

## 2023-05-08 DIAGNOSIS — I82402 Acute embolism and thrombosis of unspecified deep veins of left lower extremity: Secondary | ICD-10-CM | POA: Diagnosis not present

## 2023-05-08 DIAGNOSIS — F339 Major depressive disorder, recurrent, unspecified: Secondary | ICD-10-CM | POA: Diagnosis not present

## 2023-05-08 DIAGNOSIS — R1013 Epigastric pain: Secondary | ICD-10-CM | POA: Diagnosis not present

## 2023-05-08 DIAGNOSIS — E162 Hypoglycemia, unspecified: Secondary | ICD-10-CM | POA: Diagnosis not present

## 2023-05-08 DIAGNOSIS — Z794 Long term (current) use of insulin: Secondary | ICD-10-CM | POA: Diagnosis not present

## 2023-05-08 DIAGNOSIS — J984 Other disorders of lung: Secondary | ICD-10-CM | POA: Diagnosis not present

## 2023-05-08 DIAGNOSIS — E876 Hypokalemia: Secondary | ICD-10-CM | POA: Diagnosis not present

## 2023-05-08 DIAGNOSIS — Z515 Encounter for palliative care: Secondary | ICD-10-CM | POA: Diagnosis not present

## 2023-05-08 DIAGNOSIS — R680 Hypothermia, not associated with low environmental temperature: Secondary | ICD-10-CM | POA: Diagnosis not present

## 2023-05-08 DIAGNOSIS — Z95828 Presence of other vascular implants and grafts: Secondary | ICD-10-CM | POA: Diagnosis not present

## 2023-05-08 DIAGNOSIS — M4636 Infection of intervertebral disc (pyogenic), lumbar region: Secondary | ICD-10-CM | POA: Diagnosis not present

## 2023-05-08 DIAGNOSIS — G4700 Insomnia, unspecified: Secondary | ICD-10-CM | POA: Diagnosis not present

## 2023-05-08 DIAGNOSIS — L89623 Pressure ulcer of left heel, stage 3: Secondary | ICD-10-CM | POA: Diagnosis not present

## 2023-05-08 DIAGNOSIS — K219 Gastro-esophageal reflux disease without esophagitis: Secondary | ICD-10-CM | POA: Diagnosis not present

## 2023-05-08 DIAGNOSIS — K921 Melena: Secondary | ICD-10-CM | POA: Diagnosis not present

## 2023-05-08 DIAGNOSIS — R109 Unspecified abdominal pain: Secondary | ICD-10-CM | POA: Diagnosis not present

## 2023-05-08 DIAGNOSIS — G9341 Metabolic encephalopathy: Secondary | ICD-10-CM | POA: Diagnosis not present

## 2023-05-08 DIAGNOSIS — R1312 Dysphagia, oropharyngeal phase: Secondary | ICD-10-CM | POA: Diagnosis not present

## 2023-05-08 DIAGNOSIS — K859 Acute pancreatitis without necrosis or infection, unspecified: Secondary | ICD-10-CM | POA: Diagnosis not present

## 2023-05-08 DIAGNOSIS — R188 Other ascites: Secondary | ICD-10-CM | POA: Diagnosis not present

## 2023-05-08 DIAGNOSIS — E8809 Other disorders of plasma-protein metabolism, not elsewhere classified: Secondary | ICD-10-CM | POA: Diagnosis not present

## 2023-05-08 DIAGNOSIS — M6281 Muscle weakness (generalized): Secondary | ICD-10-CM | POA: Diagnosis not present

## 2023-05-08 DIAGNOSIS — E11649 Type 2 diabetes mellitus with hypoglycemia without coma: Secondary | ICD-10-CM | POA: Diagnosis not present

## 2023-05-08 DIAGNOSIS — R64 Cachexia: Secondary | ICD-10-CM | POA: Diagnosis not present

## 2023-05-08 DIAGNOSIS — F419 Anxiety disorder, unspecified: Secondary | ICD-10-CM | POA: Diagnosis not present

## 2023-05-08 DIAGNOSIS — K8689 Other specified diseases of pancreas: Secondary | ICD-10-CM | POA: Diagnosis not present

## 2023-05-08 DIAGNOSIS — E119 Type 2 diabetes mellitus without complications: Secondary | ICD-10-CM | POA: Diagnosis not present

## 2023-05-08 DIAGNOSIS — B965 Pseudomonas (aeruginosa) (mallei) (pseudomallei) as the cause of diseases classified elsewhere: Secondary | ICD-10-CM | POA: Diagnosis not present

## 2023-05-08 DIAGNOSIS — E785 Hyperlipidemia, unspecified: Secondary | ICD-10-CM | POA: Diagnosis not present

## 2023-05-08 DIAGNOSIS — E43 Unspecified severe protein-calorie malnutrition: Secondary | ICD-10-CM | POA: Diagnosis not present

## 2023-05-08 DIAGNOSIS — E861 Hypovolemia: Secondary | ICD-10-CM | POA: Diagnosis not present

## 2023-05-08 DIAGNOSIS — I509 Heart failure, unspecified: Secondary | ICD-10-CM | POA: Diagnosis not present

## 2023-05-08 DIAGNOSIS — B9561 Methicillin susceptible Staphylococcus aureus infection as the cause of diseases classified elsewhere: Secondary | ICD-10-CM | POA: Diagnosis not present

## 2023-05-08 DIAGNOSIS — R7881 Bacteremia: Secondary | ICD-10-CM | POA: Diagnosis not present

## 2023-05-08 DIAGNOSIS — R600 Localized edema: Secondary | ICD-10-CM | POA: Diagnosis not present

## 2023-05-08 DIAGNOSIS — Z87891 Personal history of nicotine dependence: Secondary | ICD-10-CM | POA: Diagnosis not present

## 2023-05-08 DIAGNOSIS — K625 Hemorrhage of anus and rectum: Secondary | ICD-10-CM | POA: Diagnosis not present

## 2023-05-08 DIAGNOSIS — L89113 Pressure ulcer of right upper back, stage 3: Secondary | ICD-10-CM | POA: Diagnosis not present

## 2023-05-08 DIAGNOSIS — I10 Essential (primary) hypertension: Secondary | ICD-10-CM | POA: Diagnosis not present

## 2023-05-08 DIAGNOSIS — L22 Diaper dermatitis: Secondary | ICD-10-CM | POA: Diagnosis not present

## 2023-05-08 DIAGNOSIS — R41841 Cognitive communication deficit: Secondary | ICD-10-CM | POA: Diagnosis not present

## 2023-05-08 DIAGNOSIS — E440 Moderate protein-calorie malnutrition: Secondary | ICD-10-CM | POA: Diagnosis not present

## 2023-05-08 DIAGNOSIS — I9589 Other hypotension: Secondary | ICD-10-CM | POA: Diagnosis not present

## 2023-05-08 DIAGNOSIS — K2970 Gastritis, unspecified, without bleeding: Secondary | ICD-10-CM | POA: Diagnosis not present

## 2023-05-08 DIAGNOSIS — L89613 Pressure ulcer of right heel, stage 3: Secondary | ICD-10-CM | POA: Diagnosis not present

## 2023-05-08 DIAGNOSIS — M86172 Other acute osteomyelitis, left ankle and foot: Secondary | ICD-10-CM | POA: Diagnosis not present

## 2023-05-08 DIAGNOSIS — I480 Paroxysmal atrial fibrillation: Secondary | ICD-10-CM | POA: Diagnosis not present

## 2023-05-08 DIAGNOSIS — R918 Other nonspecific abnormal finding of lung field: Secondary | ICD-10-CM | POA: Diagnosis not present

## 2023-05-08 DIAGNOSIS — G062 Extradural and subdural abscess, unspecified: Secondary | ICD-10-CM | POA: Diagnosis not present

## 2023-05-08 DIAGNOSIS — L89624 Pressure ulcer of left heel, stage 4: Secondary | ICD-10-CM | POA: Diagnosis not present

## 2023-05-08 DIAGNOSIS — Z7189 Other specified counseling: Secondary | ICD-10-CM | POA: Diagnosis not present

## 2023-05-08 DIAGNOSIS — R6510 Systemic inflammatory response syndrome (SIRS) of non-infectious origin without acute organ dysfunction: Secondary | ICD-10-CM | POA: Diagnosis not present

## 2023-05-08 DIAGNOSIS — M4626 Osteomyelitis of vertebra, lumbar region: Secondary | ICD-10-CM | POA: Diagnosis not present

## 2023-05-08 DIAGNOSIS — M6259 Muscle wasting and atrophy, not elsewhere classified, multiple sites: Secondary | ICD-10-CM | POA: Diagnosis not present

## 2023-05-08 DIAGNOSIS — J9811 Atelectasis: Secondary | ICD-10-CM | POA: Diagnosis not present

## 2023-05-12 DIAGNOSIS — L89124 Pressure ulcer of left upper back, stage 4: Secondary | ICD-10-CM | POA: Diagnosis not present

## 2023-05-12 DIAGNOSIS — E440 Moderate protein-calorie malnutrition: Secondary | ICD-10-CM | POA: Diagnosis not present

## 2023-05-12 DIAGNOSIS — M6281 Muscle weakness (generalized): Secondary | ICD-10-CM | POA: Diagnosis not present

## 2023-05-12 DIAGNOSIS — L22 Diaper dermatitis: Secondary | ICD-10-CM | POA: Diagnosis not present

## 2023-05-13 DIAGNOSIS — R5381 Other malaise: Secondary | ICD-10-CM | POA: Diagnosis not present

## 2023-05-13 DIAGNOSIS — B372 Candidiasis of skin and nail: Secondary | ICD-10-CM | POA: Diagnosis not present

## 2023-05-13 DIAGNOSIS — M6281 Muscle weakness (generalized): Secondary | ICD-10-CM | POA: Diagnosis not present

## 2023-05-14 DIAGNOSIS — G4700 Insomnia, unspecified: Secondary | ICD-10-CM | POA: Diagnosis not present

## 2023-05-14 DIAGNOSIS — M6281 Muscle weakness (generalized): Secondary | ICD-10-CM | POA: Diagnosis not present

## 2023-05-14 DIAGNOSIS — F339 Major depressive disorder, recurrent, unspecified: Secondary | ICD-10-CM | POA: Diagnosis not present

## 2023-05-14 DIAGNOSIS — E440 Moderate protein-calorie malnutrition: Secondary | ICD-10-CM | POA: Diagnosis not present

## 2023-05-14 DIAGNOSIS — L89624 Pressure ulcer of left heel, stage 4: Secondary | ICD-10-CM | POA: Diagnosis not present

## 2023-05-14 DIAGNOSIS — F419 Anxiety disorder, unspecified: Secondary | ICD-10-CM | POA: Diagnosis not present

## 2023-05-14 DIAGNOSIS — E119 Type 2 diabetes mellitus without complications: Secondary | ICD-10-CM | POA: Diagnosis not present

## 2023-05-21 DIAGNOSIS — L89624 Pressure ulcer of left heel, stage 4: Secondary | ICD-10-CM | POA: Diagnosis not present

## 2023-05-21 DIAGNOSIS — F419 Anxiety disorder, unspecified: Secondary | ICD-10-CM | POA: Diagnosis not present

## 2023-05-21 DIAGNOSIS — G4700 Insomnia, unspecified: Secondary | ICD-10-CM | POA: Diagnosis not present

## 2023-05-21 DIAGNOSIS — E119 Type 2 diabetes mellitus without complications: Secondary | ICD-10-CM | POA: Diagnosis not present

## 2023-05-21 DIAGNOSIS — M6281 Muscle weakness (generalized): Secondary | ICD-10-CM | POA: Diagnosis not present

## 2023-05-21 DIAGNOSIS — E440 Moderate protein-calorie malnutrition: Secondary | ICD-10-CM | POA: Diagnosis not present

## 2023-05-21 DIAGNOSIS — L89124 Pressure ulcer of left upper back, stage 4: Secondary | ICD-10-CM | POA: Diagnosis not present

## 2023-05-21 DIAGNOSIS — F339 Major depressive disorder, recurrent, unspecified: Secondary | ICD-10-CM | POA: Diagnosis not present

## 2023-05-22 ENCOUNTER — Inpatient Hospital Stay (HOSPITAL_COMMUNITY)
Admission: EM | Admit: 2023-05-22 | Discharge: 2023-05-29 | DRG: 438 | Disposition: A | Discharge status: Skilled Nursing Facility | Payer: Medicare HMO | Attending: Family Medicine | Admitting: Family Medicine

## 2023-05-22 ENCOUNTER — Encounter (HOSPITAL_COMMUNITY): Payer: Self-pay

## 2023-05-22 ENCOUNTER — Encounter: Payer: Self-pay | Admitting: Cardiology

## 2023-05-22 ENCOUNTER — Inpatient Hospital Stay (HOSPITAL_COMMUNITY): Payer: Medicare HMO

## 2023-05-22 ENCOUNTER — Other Ambulatory Visit: Payer: Self-pay

## 2023-05-22 ENCOUNTER — Ambulatory Visit (INDEPENDENT_AMBULATORY_CARE_PROVIDER_SITE_OTHER): Payer: Medicare HMO

## 2023-05-22 ENCOUNTER — Emergency Department (HOSPITAL_COMMUNITY): Payer: Medicare HMO

## 2023-05-22 ENCOUNTER — Ambulatory Visit: Payer: Medicare HMO | Attending: Cardiology | Admitting: Cardiology

## 2023-05-22 VITALS — BP 70/40 | HR 50 | Ht 72.0 in | Wt 173.0 lb

## 2023-05-22 DIAGNOSIS — Z86718 Personal history of other venous thrombosis and embolism: Secondary | ICD-10-CM | POA: Diagnosis not present

## 2023-05-22 DIAGNOSIS — J90 Pleural effusion, not elsewhere classified: Secondary | ICD-10-CM | POA: Diagnosis not present

## 2023-05-22 DIAGNOSIS — R109 Unspecified abdominal pain: Secondary | ICD-10-CM | POA: Diagnosis not present

## 2023-05-22 DIAGNOSIS — L89623 Pressure ulcer of left heel, stage 3: Secondary | ICD-10-CM | POA: Diagnosis present

## 2023-05-22 DIAGNOSIS — E8809 Other disorders of plasma-protein metabolism, not elsewhere classified: Secondary | ICD-10-CM | POA: Diagnosis present

## 2023-05-22 DIAGNOSIS — Z87440 Personal history of urinary (tract) infections: Secondary | ICD-10-CM

## 2023-05-22 DIAGNOSIS — R6510 Systemic inflammatory response syndrome (SIRS) of non-infectious origin without acute organ dysfunction: Principal | ICD-10-CM | POA: Diagnosis present

## 2023-05-22 DIAGNOSIS — I9589 Other hypotension: Secondary | ICD-10-CM

## 2023-05-22 DIAGNOSIS — R918 Other nonspecific abnormal finding of lung field: Secondary | ICD-10-CM | POA: Diagnosis not present

## 2023-05-22 DIAGNOSIS — K859 Acute pancreatitis without necrosis or infection, unspecified: Secondary | ICD-10-CM | POA: Diagnosis not present

## 2023-05-22 DIAGNOSIS — E876 Hypokalemia: Secondary | ICD-10-CM | POA: Diagnosis not present

## 2023-05-22 DIAGNOSIS — I480 Paroxysmal atrial fibrillation: Secondary | ICD-10-CM

## 2023-05-22 DIAGNOSIS — G062 Extradural and subdural abscess, unspecified: Secondary | ICD-10-CM | POA: Diagnosis present

## 2023-05-22 DIAGNOSIS — R0602 Shortness of breath: Secondary | ICD-10-CM | POA: Diagnosis not present

## 2023-05-22 DIAGNOSIS — Z792 Long term (current) use of antibiotics: Secondary | ICD-10-CM

## 2023-05-22 DIAGNOSIS — R64 Cachexia: Secondary | ICD-10-CM | POA: Diagnosis present

## 2023-05-22 DIAGNOSIS — E119 Type 2 diabetes mellitus without complications: Secondary | ICD-10-CM | POA: Diagnosis not present

## 2023-05-22 DIAGNOSIS — E861 Hypovolemia: Secondary | ICD-10-CM | POA: Diagnosis not present

## 2023-05-22 DIAGNOSIS — E860 Dehydration: Secondary | ICD-10-CM | POA: Diagnosis present

## 2023-05-22 DIAGNOSIS — B965 Pseudomonas (aeruginosa) (mallei) (pseudomallei) as the cause of diseases classified elsewhere: Secondary | ICD-10-CM | POA: Diagnosis not present

## 2023-05-22 DIAGNOSIS — I10 Essential (primary) hypertension: Secondary | ICD-10-CM | POA: Diagnosis present

## 2023-05-22 DIAGNOSIS — Z95828 Presence of other vascular implants and grafts: Secondary | ICD-10-CM

## 2023-05-22 DIAGNOSIS — J9811 Atelectasis: Secondary | ICD-10-CM | POA: Diagnosis not present

## 2023-05-22 DIAGNOSIS — Z981 Arthrodesis status: Secondary | ICD-10-CM

## 2023-05-22 DIAGNOSIS — K921 Melena: Secondary | ICD-10-CM | POA: Diagnosis not present

## 2023-05-22 DIAGNOSIS — K8689 Other specified diseases of pancreas: Secondary | ICD-10-CM | POA: Diagnosis not present

## 2023-05-22 DIAGNOSIS — E11649 Type 2 diabetes mellitus with hypoglycemia without coma: Secondary | ICD-10-CM | POA: Diagnosis not present

## 2023-05-22 DIAGNOSIS — M4636 Infection of intervertebral disc (pyogenic), lumbar region: Secondary | ICD-10-CM | POA: Diagnosis present

## 2023-05-22 DIAGNOSIS — M6281 Muscle weakness (generalized): Secondary | ICD-10-CM | POA: Diagnosis not present

## 2023-05-22 DIAGNOSIS — E43 Unspecified severe protein-calorie malnutrition: Secondary | ICD-10-CM | POA: Diagnosis not present

## 2023-05-22 DIAGNOSIS — R002 Palpitations: Secondary | ICD-10-CM | POA: Diagnosis present

## 2023-05-22 DIAGNOSIS — K2970 Gastritis, unspecified, without bleeding: Secondary | ICD-10-CM | POA: Diagnosis not present

## 2023-05-22 DIAGNOSIS — R7881 Bacteremia: Secondary | ICD-10-CM | POA: Diagnosis not present

## 2023-05-22 DIAGNOSIS — R5381 Other malaise: Secondary | ICD-10-CM | POA: Diagnosis not present

## 2023-05-22 DIAGNOSIS — R188 Other ascites: Secondary | ICD-10-CM | POA: Diagnosis present

## 2023-05-22 DIAGNOSIS — K8500 Idiopathic acute pancreatitis without necrosis or infection: Secondary | ICD-10-CM | POA: Diagnosis not present

## 2023-05-22 DIAGNOSIS — Z7189 Other specified counseling: Secondary | ICD-10-CM | POA: Diagnosis not present

## 2023-05-22 DIAGNOSIS — Z7984 Long term (current) use of oral hypoglycemic drugs: Secondary | ICD-10-CM

## 2023-05-22 DIAGNOSIS — Z833 Family history of diabetes mellitus: Secondary | ICD-10-CM

## 2023-05-22 DIAGNOSIS — R54 Age-related physical debility: Secondary | ICD-10-CM | POA: Diagnosis present

## 2023-05-22 DIAGNOSIS — D649 Anemia, unspecified: Secondary | ICD-10-CM | POA: Diagnosis not present

## 2023-05-22 DIAGNOSIS — Z7401 Bed confinement status: Secondary | ICD-10-CM | POA: Diagnosis not present

## 2023-05-22 DIAGNOSIS — R9389 Abnormal findings on diagnostic imaging of other specified body structures: Secondary | ICD-10-CM | POA: Diagnosis not present

## 2023-05-22 DIAGNOSIS — E162 Hypoglycemia, unspecified: Secondary | ICD-10-CM | POA: Diagnosis not present

## 2023-05-22 DIAGNOSIS — L89113 Pressure ulcer of right upper back, stage 3: Secondary | ICD-10-CM | POA: Diagnosis present

## 2023-05-22 DIAGNOSIS — E785 Hyperlipidemia, unspecified: Secondary | ICD-10-CM | POA: Diagnosis not present

## 2023-05-22 DIAGNOSIS — T68XXXA Hypothermia, initial encounter: Secondary | ICD-10-CM

## 2023-05-22 DIAGNOSIS — G4733 Obstructive sleep apnea (adult) (pediatric): Secondary | ICD-10-CM | POA: Diagnosis present

## 2023-05-22 DIAGNOSIS — E1169 Type 2 diabetes mellitus with other specified complication: Secondary | ICD-10-CM | POA: Diagnosis not present

## 2023-05-22 DIAGNOSIS — K625 Hemorrhage of anus and rectum: Secondary | ICD-10-CM | POA: Diagnosis not present

## 2023-05-22 DIAGNOSIS — R197 Diarrhea, unspecified: Secondary | ICD-10-CM | POA: Diagnosis present

## 2023-05-22 DIAGNOSIS — K5900 Constipation, unspecified: Secondary | ICD-10-CM | POA: Diagnosis present

## 2023-05-22 DIAGNOSIS — M4626 Osteomyelitis of vertebra, lumbar region: Secondary | ICD-10-CM | POA: Diagnosis not present

## 2023-05-22 DIAGNOSIS — R1031 Right lower quadrant pain: Secondary | ICD-10-CM | POA: Diagnosis not present

## 2023-05-22 DIAGNOSIS — K2980 Duodenitis without bleeding: Secondary | ICD-10-CM | POA: Diagnosis not present

## 2023-05-22 DIAGNOSIS — Z8673 Personal history of transient ischemic attack (TIA), and cerebral infarction without residual deficits: Secondary | ICD-10-CM

## 2023-05-22 DIAGNOSIS — R680 Hypothermia, not associated with low environmental temperature: Secondary | ICD-10-CM | POA: Diagnosis present

## 2023-05-22 DIAGNOSIS — Z79899 Other long term (current) drug therapy: Secondary | ICD-10-CM

## 2023-05-22 DIAGNOSIS — R140 Abdominal distension (gaseous): Secondary | ICD-10-CM | POA: Diagnosis present

## 2023-05-22 DIAGNOSIS — R001 Bradycardia, unspecified: Secondary | ICD-10-CM | POA: Diagnosis present

## 2023-05-22 DIAGNOSIS — Z6823 Body mass index (BMI) 23.0-23.9, adult: Secondary | ICD-10-CM

## 2023-05-22 DIAGNOSIS — B9561 Methicillin susceptible Staphylococcus aureus infection as the cause of diseases classified elsewhere: Secondary | ICD-10-CM | POA: Diagnosis not present

## 2023-05-22 DIAGNOSIS — Z794 Long term (current) use of insulin: Secondary | ICD-10-CM

## 2023-05-22 DIAGNOSIS — Z8249 Family history of ischemic heart disease and other diseases of the circulatory system: Secondary | ICD-10-CM

## 2023-05-22 DIAGNOSIS — Z87442 Personal history of urinary calculi: Secondary | ICD-10-CM

## 2023-05-22 DIAGNOSIS — L89603 Pressure ulcer of unspecified heel, stage 3: Secondary | ICD-10-CM | POA: Diagnosis not present

## 2023-05-22 DIAGNOSIS — I959 Hypotension, unspecified: Secondary | ICD-10-CM | POA: Insufficient documentation

## 2023-05-22 DIAGNOSIS — I82402 Acute embolism and thrombosis of unspecified deep veins of left lower extremity: Secondary | ICD-10-CM | POA: Diagnosis not present

## 2023-05-22 DIAGNOSIS — Z515 Encounter for palliative care: Secondary | ICD-10-CM | POA: Diagnosis not present

## 2023-05-22 DIAGNOSIS — L89613 Pressure ulcer of right heel, stage 3: Secondary | ICD-10-CM | POA: Diagnosis not present

## 2023-05-22 DIAGNOSIS — M6289 Other specified disorders of muscle: Secondary | ICD-10-CM | POA: Diagnosis not present

## 2023-05-22 DIAGNOSIS — Z8701 Personal history of pneumonia (recurrent): Secondary | ICD-10-CM

## 2023-05-22 DIAGNOSIS — G9341 Metabolic encephalopathy: Secondary | ICD-10-CM | POA: Diagnosis not present

## 2023-05-22 DIAGNOSIS — Z87891 Personal history of nicotine dependence: Secondary | ICD-10-CM

## 2023-05-22 DIAGNOSIS — Z7901 Long term (current) use of anticoagulants: Secondary | ICD-10-CM

## 2023-05-22 DIAGNOSIS — Z743 Need for continuous supervision: Secondary | ICD-10-CM | POA: Diagnosis not present

## 2023-05-22 DIAGNOSIS — R110 Nausea: Secondary | ICD-10-CM

## 2023-05-22 DIAGNOSIS — F419 Anxiety disorder, unspecified: Secondary | ICD-10-CM | POA: Diagnosis present

## 2023-05-22 DIAGNOSIS — R1013 Epigastric pain: Secondary | ICD-10-CM | POA: Diagnosis not present

## 2023-05-22 DIAGNOSIS — M48061 Spinal stenosis, lumbar region without neurogenic claudication: Secondary | ICD-10-CM | POA: Diagnosis present

## 2023-05-22 DIAGNOSIS — R531 Weakness: Secondary | ICD-10-CM | POA: Diagnosis not present

## 2023-05-22 LAB — LACTIC ACID, PLASMA
Lactic Acid, Venous: 2 mmol/L (ref 0.5–1.9)
Lactic Acid, Venous: 1.1 mmol/L (ref 0.5–1.9)

## 2023-05-22 LAB — CBC WITH DIFFERENTIAL/PLATELET
Abs Immature Granulocytes: 0.03 10*3/uL (ref 0.00–0.07)
Basophils Absolute: 0 10*3/uL (ref 0.0–0.1)
Basophils Relative: 0 %
Eosinophils Absolute: 0 10*3/uL (ref 0.0–0.5)
Eosinophils Relative: 0 %
HCT: 31.8 % — ABNORMAL LOW (ref 39.0–52.0)
Hemoglobin: 10.1 g/dL — ABNORMAL LOW (ref 13.0–17.0)
Immature Granulocytes: 1 %
Lymphocytes Relative: 24 %
Lymphs Abs: 1.5 10*3/uL (ref 0.7–4.0)
MCH: 30.1 pg (ref 26.0–34.0)
MCHC: 31.8 g/dL (ref 30.0–36.0)
MCV: 94.6 fL (ref 80.0–100.0)
Monocytes Absolute: 0.4 10*3/uL (ref 0.1–1.0)
Monocytes Relative: 6 %
Neutro Abs: 4.4 10*3/uL (ref 1.7–7.7)
Neutrophils Relative %: 69 %
Platelets: 203 10*3/uL (ref 150–400)
RBC: 3.36 MIL/uL — ABNORMAL LOW (ref 4.22–5.81)
RDW: 16 % — ABNORMAL HIGH (ref 11.5–15.5)
WBC: 6.3 10*3/uL (ref 4.0–10.5)
nRBC: 0 % (ref 0.0–0.2)

## 2023-05-22 LAB — URINALYSIS, ROUTINE W REFLEX MICROSCOPIC
Bilirubin Urine: NEGATIVE
Glucose, UA: NEGATIVE mg/dL
Hgb urine dipstick: NEGATIVE
Ketones, ur: NEGATIVE mg/dL
Leukocytes,Ua: NEGATIVE
Nitrite: NEGATIVE
Protein, ur: NEGATIVE mg/dL
Specific Gravity, Urine: 1.01 (ref 1.005–1.030)
pH: 6 (ref 5.0–8.0)

## 2023-05-22 LAB — COMPREHENSIVE METABOLIC PANEL
ALT: 17 U/L (ref 0–44)
AST: 16 U/L (ref 15–41)
Albumin: 1.6 g/dL — ABNORMAL LOW (ref 3.5–5.0)
Alkaline Phosphatase: 80 U/L (ref 38–126)
Anion gap: 6 (ref 5–15)
BUN: 9 mg/dL (ref 8–23)
CO2: 30 mmol/L (ref 22–32)
Calcium: 8 mg/dL — ABNORMAL LOW (ref 8.9–10.3)
Chloride: 104 mmol/L (ref 98–111)
Creatinine, Ser: 0.73 mg/dL (ref 0.61–1.24)
GFR, Estimated: >60 mL/min (ref >60)
Glucose, Bld: 137 mg/dL — ABNORMAL HIGH (ref 70–99)
Potassium: 3.3 mmol/L — ABNORMAL LOW (ref 3.5–5.1)
Sodium: 140 mmol/L (ref 135–145)
Total Bilirubin: 0.2 mg/dL — ABNORMAL LOW (ref 0.3–1.2)
Total Protein: 4.5 g/dL — ABNORMAL LOW (ref 6.5–8.1)

## 2023-05-22 LAB — PROCALCITONIN: Procalcitonin: <0.1 ng/mL

## 2023-05-22 LAB — BRAIN NATRIURETIC PEPTIDE: B Natriuretic Peptide: 96 pg/mL (ref 0.0–100.0)

## 2023-05-22 LAB — TROPONIN I (HIGH SENSITIVITY)
Troponin I (High Sensitivity): 4 ng/L (ref <18)
Troponin I (High Sensitivity): 4 ng/L (ref <18)

## 2023-05-22 MED ORDER — MELATONIN 3 MG PO TABS
3.0000 mg | ORAL_TABLET | Freq: Every day | ORAL | Status: DC
  Administered 2023-05-22 – 2023-05-28 (×7): 3 mg via ORAL
  Filled 2023-05-22 (×7): qty 1

## 2023-05-22 MED ORDER — CEFADROXIL 500 MG PO CAPS
1000.0000 mg | ORAL_CAPSULE | Freq: Two times a day (BID) | ORAL | Status: DC
  Administered 2023-05-22 – 2023-05-29 (×14): 1000 mg via ORAL
  Filled 2023-05-22 (×16): qty 2

## 2023-05-22 MED ORDER — APIXABAN 5 MG PO TABS: 5.0000 mg | ORAL_TABLET | Freq: Two times a day (BID) | ORAL | 11 refills | Status: AC

## 2023-05-22 MED ORDER — OXYCODONE HCL 5 MG PO TABS
5.0000 mg | ORAL_TABLET | ORAL | Status: DC | PRN
Start: 2023-05-22 — End: 2023-05-30
  Administered 2023-05-23 – 2023-05-29 (×9): 5 mg via ORAL
  Filled 2023-05-22 (×9): qty 1

## 2023-05-22 MED ORDER — MORPHINE SULFATE (PF) 2 MG/ML IV SOLN
2.0000 mg | Freq: Once | INTRAVENOUS | Status: AC
  Administered 2023-05-22: 2 mg via INTRAVENOUS
  Filled 2023-05-22: qty 1

## 2023-05-22 MED ORDER — DICYCLOMINE HCL 10 MG PO CAPS
10.0000 mg | ORAL_CAPSULE | Freq: Once | ORAL | Status: AC
  Administered 2023-05-22: 10 mg via ORAL
  Filled 2023-05-22: qty 1

## 2023-05-22 MED ORDER — APIXABAN 5 MG PO TABS
5.0000 mg | ORAL_TABLET | Freq: Two times a day (BID) | ORAL | Status: DC
  Administered 2023-05-22 – 2023-05-26 (×8): 5 mg via ORAL
  Filled 2023-05-22 (×8): qty 1

## 2023-05-22 MED ORDER — ONDANSETRON HCL 4 MG PO TABS: 4.0000 mg | ORAL_TABLET | Freq: Four times a day (QID) | ORAL | Status: DC | PRN

## 2023-05-22 MED ORDER — POTASSIUM CHLORIDE 20 MEQ PO PACK
40.0000 meq | PACK | Freq: Once | ORAL | Status: AC
  Administered 2023-05-22: 40 meq via ORAL
  Filled 2023-05-22: qty 2

## 2023-05-22 MED ORDER — PANTOPRAZOLE SODIUM 40 MG IV SOLR
40.0000 mg | INTRAVENOUS | Status: DC
  Administered 2023-05-22 – 2023-05-26 (×4): 40 mg via INTRAVENOUS
  Filled 2023-05-22 (×4): qty 10

## 2023-05-22 MED ORDER — MORPHINE SULFATE (PF) 2 MG/ML IV SOLN: 2.0000 mg | INTRAVENOUS | Status: DC | PRN

## 2023-05-22 MED ORDER — ACETAMINOPHEN 325 MG PO TABS
650.0000 mg | ORAL_TABLET | Freq: Four times a day (QID) | ORAL | Status: DC | PRN
  Administered 2023-05-23 – 2023-05-29 (×3): 650 mg via ORAL
  Filled 2023-05-22 (×3): qty 2

## 2023-05-22 MED ORDER — SODIUM CHLORIDE 0.9 % IV BOLUS
500.0000 mL | Freq: Once | INTRAVENOUS | Status: AC
  Administered 2023-05-22: 500 mL via INTRAVENOUS

## 2023-05-22 MED ORDER — ACETAMINOPHEN 650 MG RE SUPP: 650.0000 mg | Freq: Four times a day (QID) | RECTAL | Status: DC | PRN

## 2023-05-22 MED ORDER — SODIUM CHLORIDE 0.9 % IV SOLN: INTRAVENOUS | Status: DC

## 2023-05-22 MED ORDER — ONDANSETRON HCL 4 MG/2ML IJ SOLN: 4.0000 mg | Freq: Four times a day (QID) | INTRAMUSCULAR | Status: DC | PRN

## 2023-05-22 MED ORDER — SODIUM CHLORIDE 0.9 % IV BOLUS
500.0000 mL | Freq: Once | INTRAVENOUS | Status: AC
Start: 2023-05-22 — End: 2023-05-22
  Administered 2023-05-22: 500 mL via INTRAVENOUS

## 2023-05-22 MED ORDER — HEPARIN SODIUM (PORCINE) 5000 UNIT/ML IJ SOLN: 5000.0000 [IU] | Freq: Three times a day (TID) | INTRAMUSCULAR | Status: DC

## 2023-05-22 NOTE — Assessment & Plan Note (Signed)
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 Lovell Sheehan on 01/20/2023 "

## 2023-05-22 NOTE — H&P (Signed)
History and Physical    Patient: Jim Ramirez:[id number] DOB: 20-Jun-1951 DOA: 05/22/2023 DOS: the patient was seen and examined on 05/22/2023 PCP: [name], MD  Patient coming from: SNF  Chief Complaint:  Chief Complaint  Patient presents with   Hypotension   HPI: Jim Ramirez is a 72 y.o. 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  Patient was actually seen in the cardiology office today where his blood pressure was reportedly 70/40 with dizziness and fatigue.  He was sent to the ER from there.  Patient reports he is actually here for abdominal pain.  He reports that he has abdominal pain that has been intermittent for 2 days.  It is unpredictable with no identified provoking or relieving factors.  He describes it as a crampy pain.  He has taken Pepto, Tylenol, Advil at home with no relief.  He reports whenever he stops his PPI he has "trouble."  He reports he was recently taken off of his Nexium for unknown reasons.  He was given Bentyl at admission that did not help.  Morphine seems to have helped.  He denies nausea, vomiting, diarrhea.  He reports constipation but then reports his last bowel movement was normal and it was 1 or 2 days ago.  He has had no fever.  As far as his hypotension goes he complains of dizziness and fatigue.  He reports he comes here from [facility] rehab where he is mostly nonambulatory.  He reports the dizziness is when he sits up.  He denies chest pain, but reports palpitations.  The palpitations have been present for 2 weeks.  He also cannot identify anything that provokes those.  He reports a decreased appetite.  He eats  about half a meal twice per day.  He has decreased liquid p.o. intake as well.  On review of systems patient does report he has had 2-3 months of peripheral edema.  Patient has no other complaints at this time.  He does not smoke and does not drink.  Patient reports he would want to be full code.  ACP documents were reviewed which specify a decision maker but no limitations. Review of Systems: As mentioned in the history of present illness. All other systems reviewed and are negative. 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   CTA negative for aneurysm   Depressed    Diabetes mellitus without complication (HCC)    DVT (deep venous thrombosis) (HCC)    History of kidney stones    Hypertension    Nephrolithiasis    Sleep apnea    Past Surgical History:  Procedure Laterality Date   ANTERIOR CERVICAL DECOMP/DISCECTOMY FUSION N/A 05/22/2022   Procedure: ANTERIOR CERVICAL DISCECTOMY FUSION, INTERBODY PROSTHESIS,PLATE/SCREWS CERVICAL THREE-FOUR, CERVICAL FOUR-FIVE;REMOVAL CERVICAL PLATE;  Surgeon: Tressie Stalker, MD;  Location: [facility] OR;  Service: Neurosurgery;  Laterality: N/A;  3C   CATARACT EXTRACTION W/PHACO Left 09/23/2022   Procedure: CATARACT EXTRACTION PHACO AND INTRAOCULAR LENS PLACEMENT (IOC);  Surgeon: Fabio Pierce, MD;  Location: [facility] ORS;  Service: Ophthalmology;  Laterality: Left;  CDE: 7.52   CATARACT EXTRACTION W/PHACO Right 10/07/2022   Procedure: CATARACT EXTRACTION PHACO AND INTRAOCULAR LENS PLACEMENT (IOC);  Surgeon:  Fabio Pierce, MD;  Location: [facility] ORS;  Service: Ophthalmology;  Laterality: Right;  CDE: 9.40   IR IVC FILTER PLMT / S&I /IMG GUID/MOD SED  01/19/2023   LUMBAR LAMINECTOMY/DECOMPRESSION MICRODISCECTOMY N/A 01/20/2023   Procedure: LUMBAR LAM FOR EPIDURAL ABSCESS, LUMBAR THREE-FOUR, LUMBAR FOUR-FIVE,  LUMBAR FIVE-SACRAL ONE;  Surgeon: Tressie Stalker, MD;  Location: [facility] OR;  Service: Neurosurgery;   Laterality: N/A;   STOMACH SURGERY     TEE WITHOUT CARDIOVERSION N/A 12/31/2022   Procedure: TRANSESOPHAGEAL ECHOCARDIOGRAM;  Surgeon: Little Ishikawa, MD;  Location: [facility] INVASIVE CV LAB;  Service: Cardiovascular;  Laterality: N/A;   TONSILLECTOMY  2000   Social History:  reports that he quit smoking about 30 years ago. His smoking use included cigarettes. He has never been exposed to tobacco smoke. He has never used smokeless tobacco. He reports that he does not currently use alcohol. He reports that he does not use drugs.  No Known Allergies  Family History  Problem Relation Age of Onset   Heart attack Mother    Heart attack Father    Hypertension Brother    Diabetes Brother    Coronary artery disease Brother     Prior to Admission medications   Medication Sig Start Date End Date Taking? Authorizing Provider  albuterol (VENTOLIN HFA) 108 (90 Base) MCG/ACT inhaler Inhale 2 puffs into the lungs every 6 (six) hours as needed for wheezing or shortness of breath.   Yes [provider]  apixaban (ELIQUIS) 5 MG TABS tablet Take 1 tablet (5 mg total) by mouth 2 (two) times daily. 05/22/23  Yes BranchDorothe Pea, MD  ascorbic acid (VITAMIN C) 500 MG tablet Take 500 mg by mouth daily.   Yes [provider]  cefadroxil (DURICEF) 500 MG capsule Take 2 capsules (1,000 mg total) by mouth 2 (two) times daily. Continue for long term 02/19/23  Yes Adhikari, Willia Craze, MD  clotrimazole-betamethasone (LOTRISONE) cream Apply 1 Application topically 2 (two) times daily. Apply to sacrum and groin topically for wound care   Yes [provider]  esomeprazole (NEXIUM) 40 MG capsule Take 40 mg by mouth daily.   Yes [provider]  melatonin 3 MG TABS tablet Take 3 mg by mouth at bedtime.   Yes [provider]  metoprolol tartrate (LOPRESSOR) 25 MG tablet Take 0.5 tablets (12.5 mg total) by mouth 2 (two) times daily. 01/27/23  Yes Burnadette Pop, MD  Nutritional  Supplements (PROMOD) LIQD Take 30 mLs by mouth in the morning and at bedtime. For wound healing   Yes [provider]  oxyCODONE (OXY IR/ROXICODONE) 5 MG immediate release tablet Take 1 tablet (5 mg total) by mouth every 6 (six) hours as needed for moderate pain or severe pain. 02/26/23  Yes [name], MD  tamsulosin (FLOMAX) 0.4 MG CAPS capsule Take 0.4 mg by mouth daily.   Yes [provider]  Zinc 50 MG TABS Take 50 mg by mouth daily.   Yes [provider]    Physical Exam: Vitals:   05/22/23 2000 05/22/23 2030 05/22/23 2045 05/22/23 2100  BP: (!) 106/56 96/60 (!) 94/59 (!) 111/59  Pulse: 69 66 67 72  Resp: 20 16 16 18   Temp: 97.6 F (36.4 C)   97.6 F (36.4 C)  TempSrc:      SpO2: 95% 95% 96% 95%  Weight:      Height:       1.  General: Patient lying supine in bed,  no acute distress  2. Psychiatric: Alert and oriented, mood and behavior normal for situation, pleasant and cooperative with exam   3. Neurologic: Speech and language are normal, face is symmetric, moves all 4 extremities voluntarily, at baseline without acute deficits on limited exam   4. HEENMT:  Head is atraumatic, normocephalic, pupils reactive to light, neck is supple, trachea is midline, mucous membranes are moist   5. Respiratory : Lungs are clear to auscultation bilaterally without wheezing, rhonchi, rales, no cyanosis, no increase in work of breathing or accessory muscle use   6. Cardiovascular : Heart rate normal, rhythm is regular, no murmurs, rubs or gallops, peripheral edema present, peripheral pulses palpated   7. Gastrointestinal:  Abdomen is soft, nondistended, tenderness in the right lower quadrant without guarding, bowel sounds active, no masses or organomegaly palpated   8. Skin:  Pressure injury of the heel and back   9.Musculoskeletal:  Lower extremities and heel pressure relief boots, no asymmetry in tone, peripheral edema present peripheral pulses  palpated, no tenderness to palpation in the extremities  Data Reviewed: In the ED Patient is hypothermic at 93.7 degrees and had a Lawyer applied.  He is on the bradycardic side with a heart rate of 50-69, respiratory rate 12-20, blood pressure as low as 70/40 in the cardiology office, but up to 117/96 after bolus O2 sats 95-100% Trope 4, 4 No leukocytosis, hemoglobin stable Hypokalemia at 3.3 Lactic acid 2.0 and then 1.1 UA not indicative of UTI Chest x-ray shows left basilar opacity related to elevated hemidiaphragm and streaky right infrahilar atelectasis Blood cultures pending Procalcitonin is undetectable Admission requested for hypotension and hypothermia Assessment and Plan: * SIRS (systemic inflammatory response syndrome) (HCC) - Patient sent in for hypotension, he also had a temperature of 93.7 - No leukocytosis - Initial lactic acid 2.0 but then 1.1 after a fluid bolus - Chest x-ray shows some atelectasis but no definite infection - Blood cultures pending - UA is not indicative of UTI - Patient is under the treatment of infectious disease and currently getting Duricef - Continue Duricef, which is planned to be continued for at least 10 months following hospitalization for "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 Lovell Sheehan on 01/20/2023 "  Hypokalemia - Replace and recheck - Likely secondary to poor p.o. intake  Paroxysmal atrial fibrillation with RVR (HCC) - Continue Eliquis - Holding metoprolol for bradycardia and hypotension  Pressure injury of heel, stage 3 (HCC) Present at admission - Following with wound care outpatient - Wound consult while in the hospital  Hypotension - Likely dehydration contributing - Also manual pressure is reading  better than the monitor pressure so patient's blood pressures currently systolic 110s which is still soft but not in the 70s like the pressure is reading on the monitor - Holding antihypertensives - Continue IV fluids - Continue to monitor  MSSA bacteremia - Patient has been following with infectious disease outpatient -Repeat blood cultures pending -Continue Duricef, which is planned to be continued for at least 10 months following hospitalization for "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 Lovell Sheehan on 01/20/2023 "   Abdominal pain -  Patient complaining of a crampy pain - Bentyl offered no relief - CT abdomen pelvis for completeness - Lactic acid was trending down - Physical exam reassuring with a soft abdomen and no guarding with palpation - Continue analgesics as needed - Patient reports every time he stops his PPI he has pain, continue PPI - Continue to monitor      Advance Care Planning:   Code Status: Full Code  Consults: Wound care  Family Communication: No family at bedside  Severity of Illness: The appropriate patient status for this patient is INPATIENT. Inpatient status is judged to be reasonable and necessary in order to provide the required intensity of service to ensure the patient's safety. The patient's presenting symptoms, physical exam findings, and initial radiographic and laboratory data in the context of their chronic comorbidities is felt to place them at high risk for further clinical deterioration. Furthermore, it is not anticipated that the patient will be medically stable for discharge from the hospital within 2 midnights of admission.   * I certify that at the point of admission it is my clinical judgment that the patient will require  inpatient hospital care spanning beyond 2 midnights from the point of admission due to high intensity of service, high risk for further deterioration and high frequency of surveillance required.*  Author: Lilyan Gilford, DO 05/22/2023 10:20 PM  For on call review [email or web address].

## 2023-05-22 NOTE — ED Notes (Signed)
CRITICAL VALUE STICKER  CRITICAL VALUE: lactic acid 2.0  MD NOTIFIED: Dr. Charm Barges  TIME OF NOTIFICATION: [id number]

## 2023-05-22 NOTE — ED Triage Notes (Signed)
Pt sent from cardiology for hypotension, BP 70/40 in office. In triage, BP 117/96 MAP 102 HR 53. Pt alert and oriented

## 2023-05-22 NOTE — ED Notes (Signed)
Patient placed on bear hugger

## 2023-05-22 NOTE — Progress Notes (Signed)
Clinical Summary Jim Ramirez is a 72 y.o.male seen today for follow up of the following medical problems.    1.History of osteomyelitis/bacteremia/discitis/epidural abscess Ultimately required lumbar laminectomy and evacuation of his abscess on 6/24  - followed by ID as outpatient.    2. Afib  - new diagnosis during 11/2022 admission with bacteremia - managed with IV amiodarone, transitioned to oral - could start anticoag once cleared by neurosurg, has been on eliquis 5mg  bid  - amio no longer on list. Admitted 04/2023 [facility], not listed at all med list. Assuming confusion and it was omitted - reports palpitatons, lasting up to 30 minute at times.    3. DVT - IVC filter placed by IR - 12/2022 +DVT  04/2023 admission with pneumonia [facility], no mention of eliquis on his paperwork, appears was omitted.   4. History of spontaneous subarachnoid hemorrhage - followed by neurosurgy - eliquis was started during recent admission for DVT/afib as neurosurg team followed   5. Hypotension - poor oral hydration - report some generalized fatigue, dizziness     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   CTA negative for aneurysm   Depressed    Diabetes mellitus without complication (HCC)    DVT (deep venous thrombosis) (HCC)    History of kidney stones    Hypertension    Nephrolithiasis    Sleep apnea      No Known Allergies   Current Outpatient Medications  Medication Sig Dispense Refill   amiodarone (PACERONE) 200 MG tablet Take 1 tablet (200 mg total) by mouth daily.     apixaban (ELIQUIS) 5 MG TABS tablet Take 1 tablet (5 mg total) by mouth 2 (two) times daily. 60 tablet    atorvastatin (LIPITOR) 40 MG tablet      azelastine (ASTELIN) 0.1 % nasal spray Place 1 spray into both nostrils 2 (two) times daily. Use in each nostril as directed (Patient not taking: Reported on 03/12/2023)     cefadroxil  (DURICEF) 500 MG capsule Take 2 capsules (1,000 mg total) by mouth 2 (two) times daily. Continue for long term     cyanocobalamin (VITAMIN B12) 1000 MCG/ML injection Inject 1,000 mcg into the muscle every 30 (thirty) days.     dapagliflozin propanediol (FARXIGA) 10 MG TABS tablet Take 10 mg by mouth daily.     diclofenac Sodium (VOLTAREN) 1 % GEL Apply 2 g topically 4 (four) times daily.     docusate sodium (COLACE) 100 MG capsule Take 1 capsule (100 mg total) by mouth 2 (two) times daily. 10 capsule 0   escitalopram (LEXAPRO) 10 MG tablet Take 10 mg by mouth daily.     esomeprazole (NEXIUM) 40 MG capsule Take 40 mg by mouth daily.     ferrous sulfate 325 (65 FE) MG tablet Take 1 tablet (325 mg total) by mouth daily with breakfast.  3   furosemide (LASIX) 20 MG tablet Take 1 tablet (20 mg total) by mouth daily. 30 tablet    gabapentin (NEURONTIN) 100 MG capsule Take 1 capsule (100 mg total) by mouth 2 (two) times daily.     hydrOXYzine (ATARAX) 25 MG tablet Take 25 mg by mouth 3 (three) times daily as needed.     insulin aspart (NOVOLOG) 100 UNIT/ML injection Inject 3 Units into the skin 3 (three) times daily with meals. If eats more than 50% of meals 10 mL 11   insulin glargine-yfgn (  SEMGLEE) 100 UNIT/ML injection Inject 0.04 mLs (4 Units total) into the skin at bedtime.     leptospermum manuka honey (MEDIHONEY) PSTE paste Apply 1 Application topically daily.     levocetirizine (XYZAL) 5 MG tablet Take 5 mg by mouth every evening.     metoprolol tartrate (LOPRESSOR) 25 MG tablet Take 0.5 tablets (12.5 mg total) by mouth 2 (two) times daily.     nystatin (MYCOSTATIN/NYSTOP) powder Apply 1 Application topically daily. For 10 days     Omeprazole 20 MG TBEC      oxyCODONE (OXY IR/ROXICODONE) 5 MG immediate release tablet Take 1 tablet (5 mg total) by mouth every 6 (six) hours as needed for moderate pain or severe pain. 12 tablet 0   potassium chloride SA (KLOR-CON M) 20 MEQ tablet Take 1 tablet (20  mEq total) by mouth daily.  0   risperiDONE (RISPERDAL) 0.5 MG tablet Take 1 tablet (0.5 mg total) by mouth at bedtime.     rosuvastatin (CRESTOR) 20 MG tablet Take 20 mg by mouth daily.     SANTYL 250 UNIT/GM ointment Apply topically.     tamsulosin (FLOMAX) 0.4 MG CAPS capsule Take 0.4 mg by mouth daily.     torsemide (DEMADEX) 20 MG tablet Take 20 mg by mouth daily.     No current facility-administered medications for this visit.     Past Surgical History:  Procedure Laterality Date   ANTERIOR CERVICAL DECOMP/DISCECTOMY FUSION N/A 05/22/2022   Procedure: ANTERIOR CERVICAL DISCECTOMY FUSION, INTERBODY PROSTHESIS,PLATE/SCREWS CERVICAL THREE-FOUR, CERVICAL FOUR-FIVE;REMOVAL CERVICAL PLATE;  Surgeon: Tressie Stalker, MD;  Location: [facility] OR;  Service: Neurosurgery;  Laterality: N/A;  3C   CATARACT EXTRACTION W/PHACO Left 09/23/2022   Procedure: CATARACT EXTRACTION PHACO AND INTRAOCULAR LENS PLACEMENT (IOC);  Surgeon: Fabio Pierce, MD;  Location: [facility] ORS;  Service: Ophthalmology;  Laterality: Left;  CDE: 7.52   CATARACT EXTRACTION W/PHACO Right 10/07/2022   Procedure: CATARACT EXTRACTION PHACO AND INTRAOCULAR LENS PLACEMENT (IOC);  Surgeon: Fabio Pierce, MD;  Location: [facility] ORS;  Service: Ophthalmology;  Laterality: Right;  CDE: 9.40   IR IVC FILTER PLMT / S&I /IMG GUID/MOD SED  01/19/2023   LUMBAR LAMINECTOMY/DECOMPRESSION MICRODISCECTOMY N/A 01/20/2023   Procedure: LUMBAR LAM FOR EPIDURAL ABSCESS, LUMBAR THREE-FOUR, LUMBAR FOUR-FIVE,  LUMBAR FIVE-SACRAL ONE;  Surgeon: Tressie Stalker, MD;  Location: [facility] OR;  Service: Neurosurgery;  Laterality: N/A;   STOMACH SURGERY     TEE WITHOUT CARDIOVERSION N/A 12/31/2022   Procedure: TRANSESOPHAGEAL ECHOCARDIOGRAM;  Surgeon: Little Ishikawa, MD;  Location: [facility] INVASIVE CV LAB;  Service: Cardiovascular;  Laterality: N/A;   TONSILLECTOMY  2000     No Known Allergies    Family History  Problem Relation Age of Onset   Heart attack Mother     Heart attack Father    Hypertension Brother    Diabetes Brother    Coronary artery disease Brother      Social History Jim Ramirez reports that he quit smoking about 30 years ago. His smoking use included cigarettes. He has never been exposed to tobacco smoke. He has never used smokeless tobacco. Jim Ramirez reports that he does not currently use alcohol.   Review of Systems CONSTITUTIONAL: No weight loss, fever, chills, weakness or fatigue.  HEENT: Eyes: No visual loss, blurred vision, double vision or yellow sclerae.No hearing loss, sneezing, congestion, runny nose or sore throat.  SKIN: No rash or itching.  CARDIOVASCULAR: per hpi RESPIRATORY: No shortness of breath, cough or sputum.  GASTROINTESTINAL: No anorexia,  nausea, vomiting or diarrhea. No abdominal pain or blood.  GENITOURINARY: No burning on urination, no polyuria NEUROLOGICAL: No headache, dizziness, syncope, paralysis, ataxia, numbness or tingling in the extremities. No change in bowel or bladder control.  MUSCULOSKELETAL: No muscle, back pain, joint pain or stiffness.  LYMPHATICS: No enlarged nodes. No history of splenectomy.  PSYCHIATRIC: No history of depression or anxiety.  ENDOCRINOLOGIC: No reports of sweating, cold or heat intolerance. No polyuria or polydipsia.  Marland Kitchen   Physical Examination Today's Vitals   05/22/23 1315  BP: (!) 70/40  Pulse: (!) 50  SpO2: 100%  Weight: 173 lb (78.5 kg)  Height: 6' (1.829 m)   Body mass index is 23.46 kg/m.  Gen: resting comfortably, no acute distress HEENT: no scleral icterus, pupils equal round and reactive, no palptable cervical adenopathy,  CV: RRR, no mrg, no jvd Resp: Clear to auscultation bilaterally GI: abdomen is soft, non-tender, non-distended, normal bowel sounds, no hepatosplenomegaly MSK: extremities are warm, 2+ bilateral LE edema Skin: warm, no rash Neuro:  no focal deficits Psych: appropriate affect   Diagnostic Studies  05/2022 echo with pcp:  LVEF 55-60%, grade I dd J an 2024 nuclear stress:  Patient exercised for 6 minutes and 6 seconds, according to Bruce protocol, achieving 7.0 METS. HR increased to 133 bpm which is 88% of maximum predicted HR. Patient has average exercise capacity.   No chest pain during the test   Stress ECG is negative for ischemia.   LV perfusion is normal. There is no evidence of current ischemia or prior infarction.   Left ventricular function is normal. Nuclear stress EF: 65 %.   The study is normal. The study is low risk.     Assessment and Plan   1.Afib - diagnosed during admission with severe systemic illness, unclear if isolated - amio appears to have been omitted during recent [facility] admission, ok not to restart. - eliquis also omitted during that admission, will restart that - some palpitations at times, check 2 week zio patch HR 50s today in clinic, can d/c his metoprolol in ER if sustained brady  2.DVT - restart eliquis  3. Hypotension - bp 70s/40s, sending to ER - reports generalized fatigue, dizziness - reports very poor oral hydration, hopefully just hypovolemia  4. 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    Antoine Poche, M.D

## 2023-05-22 NOTE — Patient Instructions (Signed)
Medication Instructions:  Your physician has recommended you make the following change in your medication:   -Start Eliquis 5 mg tablets twice daily.   *If you need a refill on your cardiac medications before your next appointment, please call your pharmacy*   Lab Work: None If you have labs (blood work) drawn today and your tests are completely normal, you will receive your results only by: MyChart Message (if you have MyChart) OR A paper copy in the mail If you have any lab test that is abnormal or we need to change your treatment, we will call you to review the results.   Testing/Procedures: Zio   Follow-Up: At [facility], you and your health needs are our priority.  As part of our continuing mission to provide you with exceptional heart care, we have created designated Provider Care Teams.  These Care Teams include your primary Cardiologist (physician) and Advanced Practice Providers (APPs -  Physician Assistants and Nurse Practitioners) who all work together to provide you with the care you need, when you need it.  We recommend signing up for the patient portal called "MyChart".  Sign up information is provided on this After Visit Summary.  MyChart is used to connect with patients for Virtual Visits (Telemedicine).  Patients are able to view lab/test results, encounter notes, upcoming appointments, etc.  Non-urgent messages can be sent to your provider as well.   To learn more about what you can do with MyChart, go to [email or web address].    Your next appointment:    Pending  Provider:   You may see Dina Rich, MD or one of the following Advanced Practice Providers on your designated Care Team:   Randall An, PA-C  Jacolyn Reedy, PA-C     Other Instructions Jim Ramirez- Long Term Monitor Instructions   Your physician has requested you wear your ZIO patch monitor___14____days.   This is a single patch monitor.  [employer] supplies one patch monitor per  enrollment.  Additional stickers are not available.   Please do not apply patch if you will be having a Nuclear Stress Test, Echocardiogram, Cardiac CT, MRI, or Chest Xray during the time frame you would be wearing the monitor. The patch cannot be worn during these tests.  You cannot remove and re-apply the ZIO XT patch monitor.   Your ZIO patch monitor will be sent USPS Priority mail from [facility] directly to your home address. The monitor may also be mailed to a PO BOX if home delivery is not available.   It may take 3-5 days to receive your monitor after you have been enrolled.   Once you have received you monitor, please review enclosed instructions.  Your monitor has already been registered assigning a specific monitor serial # to you.   Applying the monitor   Shave hair from upper left chest.   Hold abrader disc by orange tab.  Rub abrader in 40 strokes over left upper chest as indicated in your monitor instructions.   Clean area with 4 enclosed alcohol pads .  Use all pads to assure are is cleaned thoroughly.  Let dry.   Apply patch as indicated in monitor instructions.  Patch will be place under collarbone on left side of chest with arrow pointing upward.   Rub patch adhesive wings for 2 minutes.Remove white label marked "1".  Remove white label marked "2".  Rub patch adhesive wings for 2 additional minutes.   While looking in a mirror, press and release button  in center of patch.  A small green light will flash 3-4 times .  This will be your only indicator the monitor has been turned on.     Do not shower for the first 24 hours.  You may shower after the first 24 hours.   Press button if you feel a symptom. You will hear a small click.  Record Date, Time and Symptom in the Patient Log Book.   When you are ready to remove patch, follow instructions on last 2 pages of Patient Log Book.  Stick patch monitor onto last page of Patient Log Book.   Place Patient Log Book in  [place] box.  Use locking tab on box and tape box closed securely.  The [employer] has [employer] on it.  Please place in mailbox as soon as possible.  Your physician should have your test results approximately 7 days after the monitor has been mailed back to [facility].   Call [facility] Customer Care at [phone] if you have questions regarding your ZIO XT patch monitor.  Call them immediately if you see an orange light blinking on your monitor.   If your monitor falls off in less than 4 days contact our Monitor department at [phone].  If your monitor becomes loose or falls off after 4 days call [employer] at [phone] for suggestions on securing your monitor.

## 2023-05-22 NOTE — ED Provider Notes (Signed)
[place] EMERGENCY DEPARTMENT AT [facility] Provider Note   CSN: [id number] Arrival date & time: 05/22/23  1432     History  Chief Complaint  Patient presents with   Hypotension    Jim Ramirez is a 72 y.o. male.  He was seen in cardiology clinic delay and was sent down for evaluation of low blood pressure.  He said his blood pressure has been down for few days.  He is currently in rehab.  He was admitted back in May for disseminated MSSA infection with epidural abscess.  He had neurosurgical drainage and is underwent long-term IV antibiotics.  He was admitted to [facility] at the beginning of October for left lower lobe pneumonia.  Recently diagnosed with A-fib back in May.  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  Radiology evaluated him today and is recommending restarting Eliquis.  Also noted on to be bradycardic along with hypotensive recommending DC metoprolol if continues bradycardia.  Sent to ED for evaluation of low blood pressure possible hypovolemia.  Patient denies headache or chest pain, abd pain.  No fevers chills nausea vomiting.  Has some baseline shortness of breath and a little bit of diarrhea.  He is not ambulating yet but mostly getting around in a wheelchair.  He does endorse dizzy spells at times.  He has multiple wounds on his back that he sees wound clinic for and he tells me that there comfortable with the way they are progressing.  The history is provided by the patient.  Weakness Severity:  Moderate Onset quality:  Gradual Duration:  3 days Timing:  Constant Progression:  Unchanged Chronicity:  Recurrent Relieved by:  None tried Worsened by:  Activity Ineffective treatments:  Rest Associated symptoms: diarrhea, difficulty walking, dizziness and shortness of breath   Associated symptoms: no abdominal pain, no chest pain, no cough, no dysuria, no fever, no headaches, no nausea and no vomiting        Home Medications Prior to Admission  medications   Medication Sig Start Date End Date Taking? Authorizing Provider  amiodarone (PACERONE) 200 MG tablet Take 1 tablet (200 mg total) by mouth daily. Patient not taking: Reported on 05/22/2023 01/28/23   Burnadette Pop, MD  apixaban (ELIQUIS) 5 MG TABS tablet Take 1 tablet (5 mg total) by mouth 2 (two) times daily. 05/22/23   Antoine Poche, MD  ascorbic acid (VITAMIN C) 500 MG tablet Take 500 mg by mouth daily.    [provider]  atorvastatin (LIPITOR) 40 MG tablet     [provider]  azelastine (ASTELIN) 0.1 % nasal spray Place 1 spray into both nostrils 2 (two) times daily. Use in each nostril as directed Patient not taking: Reported on 03/12/2023    [provider]  cefadroxil (DURICEF) 500 MG capsule Take 2 capsules (1,000 mg total) by mouth 2 (two) times daily. Continue for long term 02/19/23   Burnadette Pop, MD  cyanocobalamin (VITAMIN B12) 1000 MCG/ML injection Inject 1,000 mcg into the muscle every 30 (thirty) days. 03/12/22   [provider]  dapagliflozin propanediol (FARXIGA) 10 MG TABS tablet Take 10 mg by mouth daily. Patient not taking: Reported on 05/22/2023    [provider]  diclofenac Sodium (VOLTAREN) 1 % GEL Apply 2 g topically 4 (four) times daily. Patient not taking: Reported on 05/22/2023 01/27/23   Burnadette Pop, MD  docusate sodium (COLACE) 100 MG capsule Take 1 capsule (100 mg total) by mouth 2 (two)  times daily. Patient not taking: Reported on 05/22/2023 05/23/22   Val Eagle D, NP  escitalopram (LEXAPRO) 10 MG tablet Take 10 mg by mouth daily. Patient not taking: Reported on 05/22/2023    [provider]  esomeprazole (NEXIUM) 40 MG capsule Take 40 mg by mouth daily.    [provider]  ferrous sulfate 325 (65 FE) MG tablet Take 1 tablet (325 mg total) by mouth daily with breakfast. Patient not taking: Reported on 05/22/2023 01/28/23   Burnadette Pop, MD  furosemide (LASIX) 20 MG  tablet Take 1 tablet (20 mg total) by mouth daily. Patient not taking: Reported on 05/22/2023 01/28/23   Burnadette Pop, MD  gabapentin (NEURONTIN) 100 MG capsule Take 1 capsule (100 mg total) by mouth 2 (two) times daily. 01/27/23   Burnadette Pop, MD  hydrOXYzine (ATARAX) 25 MG tablet Take 25 mg by mouth 3 (three) times daily as needed.    [provider]  insulin aspart (NOVOLOG) 100 UNIT/ML injection Inject 3 Units into the skin 3 (three) times daily with meals. If eats more than 50% of meals Patient not taking: Reported on 05/22/2023 01/27/23   Burnadette Pop, MD  insulin glargine-yfgn (SEMGLEE) 100 UNIT/ML injection Inject 0.04 mLs (4 Units total) into the skin at bedtime. Patient not taking: Reported on 05/22/2023 02/26/23   [name], MD  leptospermum manuka honey (MEDIHONEY) PSTE paste Apply 1 Application topically daily. Patient not taking: Reported on 05/22/2023 02/27/23   [name], MD  levocetirizine (XYZAL) 5 MG tablet Take 5 mg by mouth every evening. Patient not taking: Reported on 05/22/2023    [provider]  melatonin 3 MG TABS tablet Take 3 mg by mouth at bedtime.    [provider]  metoprolol tartrate (LOPRESSOR) 25 MG tablet Take 0.5 tablets (12.5 mg total) by mouth 2 (two) times daily. 01/27/23   Burnadette Pop, MD  nystatin (MYCOSTATIN/NYSTOP) powder Apply 1 Application topically daily. For 10 days Patient not taking: Reported on 05/22/2023 02/11/23   [provider]  Omeprazole 20 MG TBEC     [provider]  oxyCODONE (OXY IR/ROXICODONE) 5 MG immediate release tablet Take 1 tablet (5 mg total) by mouth every 6 (six) hours as needed for moderate pain or severe pain. 02/26/23   [name], MD  potassium chloride SA (KLOR-CON M) 20 MEQ tablet Take 1 tablet (20 mEq total) by mouth daily. Patient not taking: Reported on 05/22/2023 01/28/23   Burnadette Pop, MD  risperiDONE (RISPERDAL) 0.5 MG tablet Take 1 tablet (0.5 mg total)  by mouth at bedtime. Patient not taking: Reported on 05/22/2023 01/27/23   Burnadette Pop, MD  rosuvastatin (CRESTOR) 20 MG tablet Take 20 mg by mouth daily. Patient not taking: Reported on 05/22/2023 04/07/22   [provider]  SANTYL 250 UNIT/GM ointment Apply topically. 03/12/23   [provider]  tamsulosin (FLOMAX) 0.4 MG CAPS capsule Take 0.4 mg by mouth daily.    [provider]  torsemide (DEMADEX) 20 MG tablet Take 20 mg by mouth daily. Patient not taking: Reported on 05/22/2023    [provider]  Zinc 50 MG TABS Take 50 mg by mouth daily.    [provider]      Allergies    Patient has no known allergies.    Review of Systems   Review of Systems  Constitutional:  Positive for fatigue. Negative for fever.  Respiratory:  Positive for shortness of breath. Negative for cough.  Cardiovascular:  Negative for chest pain.  Gastrointestinal:  Positive for diarrhea. Negative for abdominal pain, nausea and vomiting.  Genitourinary:  Negative for dysuria.  Skin:  Positive for wound.  Neurological:  Positive for dizziness and weakness. Negative for headaches.    Physical Exam Updated Vital Signs BP (!) 117/96 (BP Location: Left Arm)   Pulse (!) 53   Resp 14   Ht 6' (1.829 m)   Wt 79.4 kg   SpO2 97%   BMI 23.73 kg/m  Physical Exam Vitals and nursing note reviewed.  Constitutional:      General: He is not in acute distress.    Appearance: Normal appearance. He is well-developed.  HENT:     Head: Normocephalic and atraumatic.  Eyes:     Conjunctiva/sclera: Conjunctivae normal.  Cardiovascular:     Rate and Rhythm: Normal rate and regular rhythm.     Heart sounds: No murmur heard. Pulmonary:     Effort: Pulmonary effort is normal. No respiratory distress.     Breath sounds: Normal breath sounds.  Abdominal:     Palpations: Abdomen is soft.     Tenderness: There is no abdominal tenderness. There is no guarding or rebound.   Musculoskeletal:     Cervical back: Neck supple.     Right lower leg: Edema present.     Left lower leg: Edema present.  Skin:    General: Skin is warm and dry.     Capillary Refill: Capillary refill takes less than 2 seconds.  Neurological:     General: No focal deficit present.     Mental Status: He is alert.     ED Results / Procedures / Treatments   Labs (all labs ordered are listed, but only abnormal results are displayed) Labs Reviewed  COMPREHENSIVE METABOLIC PANEL - Abnormal; Notable for the following components:      Result Value   Potassium 3.3 (*)    Glucose, Bld 137 (*)    Calcium 8.0 (*)    Total Protein 4.5 (*)    Albumin 1.6 (*)    Total Bilirubin 0.2 (*)    All other components within normal limits  CBC WITH DIFFERENTIAL/PLATELET - Abnormal; Notable for the following components:   RBC 3.36 (*)    Hemoglobin 10.1 (*)    HCT 31.8 (*)    RDW 16.0 (*)    All other components within normal limits  LACTIC ACID, PLASMA - Abnormal; Notable for the following components:   Lactic Acid, Venous 2.0 (*)    All other components within normal limits  COMPREHENSIVE METABOLIC PANEL - Abnormal; Notable for the following components:   Potassium 3.2 (*)    Glucose, Bld 58 (*)    Calcium 7.6 (*)    Total Protein 3.9 (*)    Albumin <1.5 (*)    AST 12 (*)    All other components within normal limits  CBC WITH DIFFERENTIAL/PLATELET - Abnormal; Notable for the following components:   RBC 2.90 (*)    Hemoglobin 8.7 (*)    HCT 27.4 (*)    RDW 16.1 (*)    All other components within normal limits  LIPASE, BLOOD - Abnormal; Notable for the following components:   Lipase 214 (*)    All other components within normal limits  LIPID PANEL - Abnormal; Notable for the following components:   HDL 34 (*)    All other components within normal limits  LIPASE, BLOOD - Abnormal; Notable for the following components:  Lipase 206 (*)    All other components within normal limits   CULTURE, BLOOD (ROUTINE X 2)  CULTURE, BLOOD (ROUTINE X 2)  MRSA NEXT GEN BY PCR, NASAL  BRAIN NATRIURETIC PEPTIDE  LACTIC ACID, PLASMA  URINALYSIS, ROUTINE W REFLEX MICROSCOPIC  PROCALCITONIN  MAGNESIUM  GLUCOSE, CAPILLARY  TROPONIN I (HIGH SENSITIVITY)  TROPONIN I (HIGH SENSITIVITY)    EKG EKG Interpretation Date/Time:  Thursday May 22 2023 14:56:47 EDT Ventricular Rate:  58 PR Interval:    QRS Duration:  156 QT Interval:  541 QTC Calculation: 532 R Axis:   72  Text Interpretation: Junctional rhythm vs aflutter Right bundle branch block No significant change since prior today Confirmed by Meridee Score [phone]) on 05/22/2023 2:59:43 PM  Radiology CT ABDOMEN PELVIS WO CONTRAST  Result Date: 05/22/2023 CLINICAL DATA:  Acute nonlocalized abdominal pain. EXAM: CT ABDOMEN AND PELVIS WITHOUT CONTRAST TECHNIQUE: Multidetector CT imaging of the abdomen and pelvis was performed following the standard protocol without IV contrast. RADIATION DOSE REDUCTION: This exam was performed according to the departmental dose-optimization program which includes automated exposure control, adjustment of the mA and/or kV according to patient size and/or use of iterative reconstruction technique. COMPARISON:  CT chest abdomen and pelvis 12/22/2022 FINDINGS: Lower chest: Small bilateral pleural effusions with basilar atelectasis or consolidation, greater on the left. This could represent compressive atelectasis or pneumonia. Elevation of the left hemidiaphragm. Hepatobiliary: No focal liver abnormality is seen. No gallstones, gallbladder wall thickening, or biliary dilatation. Pancreas: The pancreas is diffusely enlarged with edema around the pancreas and throughout the upper abdomen. Appearances are likely to represent acute pancreatitis. No loculated collections are identified. Spleen: Normal in size without focal abnormality. Adrenals/Urinary Tract: No adrenal gland nodules. Kidneys are symmetrical.  Bilateral intrarenal stones. Largest on the left measures 4 mm diameter. No hydronephrosis or hydroureter. Bladder wall is diffusely thickened, likely cystitis or outlet obstruction. Correlate with urinalysis. Stomach/Bowel: Stomach is decompressed. The gastric wall is diffusely thickened. This could be due to under distention or may indicate gastritis. Small bowel are decompressed. There is wall thickening of the retroperitoneal portion of the duodenal, likely reactive inflammation. Stool-filled colon without abnormal distention. Appendix is not identified. Vascular/Lymphatic: Calcification of the abdominal aorta. No aneurysm. IVC filter. No significant lymphadenopathy. Reproductive: Prostate is unremarkable. Other: Moderate free fluid in the abdomen and pelvis, likely ascites. No free air. Prominent diffuse soft tissue edema in the subcutaneous fat. Musculoskeletal: Degenerative changes in the spine and hips. Areas of lucency and sclerosis in the L4, L5, and S1 vertebrae. This is likely degenerative but could indicate metastatic disease in the appropriate clinical setting. Curvilinear sclerosis in the femoral head suggest avascular necrosis. IMPRESSION: 1. Diffuse enlargement of the pancreas with peripancreatic edema likely representing acute pancreatitis. Wall thickening of the duodenal is likely reactive. No loculated collections. 2. Diffuse gastric wall thickening may be due to under distention or gastritis. 3. Small bilateral pleural effusions with basilar atelectasis or consolidation, greater on the left. 4. Diffuse bladder wall thickening, possibly under outlet obstruction or cystitis. Correlate with urinalysis. 5. Moderate ascites. 6. Aortic atherosclerosis. 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 Electronically Signed   By: Burman Nieves M.D.   On: 05/22/2023 23:51   DG Chest Port 1 View  Result Date: 05/22/2023 CLINICAL DATA:  Weakness. EXAM: PORTABLE CHEST 1 VIEW COMPARISON:  05/01/2023, chest CT  12/23/2023 FINDINGS: Stable heart size and mediastinal contours. Left basilar opacity is again seen, elevated hemidiaphragm and atelectasis on prior CT. There  may be a small left pleural effusion. Streaky right infrahilar atelectasis. No pulmonary edema. No pneumothorax. IMPRESSION: 1. Left basilar opacity is in part related to elevated hemidiaphragm and atelectasis when compared prior CT. There may be a small left pleural effusion. 2. Streaky right infrahilar atelectasis. Electronically Signed   By: Narda Rutherford M.D.   On: 05/22/2023 16:27    Procedures .Critical Care  Performed by: Terrilee Files, MD Authorized by: Terrilee Files, MD   Critical care provider statement:    Critical care time (minutes):  45   Critical care time was exclusive of:  Separately billable procedures and treating other patients   Critical care was necessary to treat or prevent imminent or life-threatening deterioration of the following conditions: SIRS.   Critical care was time spent personally by me on the following activities:  Development of treatment plan with patient or surrogate, discussions with consultants, evaluation of patient's response to treatment, examination of patient, obtaining history from patient or surrogate, ordering and performing treatments and interventions, ordering and review of laboratory studies, ordering and review of radiographic studies, pulse oximetry, re-evaluation of patient's condition and review of old charts   I assumed direction of critical care for this patient from another provider in my specialty: no       Medications Ordered in ED Medications  acetaminophen (TYLENOL) tablet 650 mg (has no administration in time range)    Or  acetaminophen (TYLENOL) suppository 650 mg (has no administration in time range)  oxyCODONE (Oxy IR/ROXICODONE) immediate release tablet 5 mg (5 mg Oral Given by Other 05/23/23 0222)  0.9 %  sodium chloride infusion ( Intravenous Rate/Dose Change  05/23/23 0754)  ondansetron (ZOFRAN) tablet 4 mg (has no administration in time range)    Or  ondansetron (ZOFRAN) injection 4 mg (has no administration in time range)  pantoprazole (PROTONIX) injection 40 mg (40 mg Intravenous Given 05/22/23 2351)  cefadroxil (DURICEF) capsule 1,000 mg (1,000 mg Oral Given 05/23/23 0915)  apixaban (ELIQUIS) tablet 5 mg (5 mg Oral Given 05/23/23 0911)  melatonin tablet 3 mg (3 mg Oral Given 05/22/23 2350)  Chlorhexidine Gluconate Cloth 2 % PADS 6 each (6 each Topical Given 05/23/23 0915)  morphine (PF) 2 MG/ML injection 1 mg (has no administration in time range)  potassium chloride 10 mEq in 100 mL IVPB (10 mEq Intravenous New Bag/Given 05/23/23 0852)  sodium chloride 0.9 % bolus 500 mL (0 mLs Intravenous Stopped 05/22/23 1638)  sodium chloride 0.9 % bolus 500 mL (500 mLs Intravenous New Bag/Given 05/22/23 1732)  dicyclomine (BENTYL) capsule 10 mg (10 mg Oral Given 05/22/23 2058)  potassium chloride (KLOR-CON) packet 40 mEq (40 mEq Oral Given 05/22/23 2348)  morphine (PF) 2 MG/ML injection 2 mg (2 mg Intravenous Given 05/22/23 2238)  sodium chloride 0.9 % bolus 500 mL (500 mLs Intravenous New Bag/Given 05/22/23 2313)  melatonin tablet 6 mg (6 mg Oral Given by Other 05/23/23 0221)    ED Course/ Medical Decision Making/ A&P Clinical Course as of 05/23/23 0931  Thu May 22, 2023  1507 Chest x-ray interpreted by me as no definite infiltrate.  Does at least have some atelectasis in the bases.  Awaiting radiology reading. [MB]  2039 Patient still hypothermic although starting to trend up.  Blood pressure is also improving.  Discussed with [facility] hospitalist Dr. Dorthula Perfect who will evaluate patient for admission. [MB]    Clinical Course User Index [MB] Terrilee Files, MD  Medical Decision Making Amount and/or Complexity of Data Reviewed Labs: ordered. Radiology: ordered.  Risk Decision regarding hospitalization.   This  patient complains of low blood pressure dizziness general weakness; this involves an extensive number of treatment Options and is a complaint that carries with it a high risk of complications and morbidity. The differential includes hypovolemia, sepsis, Sirs, metabolic derangement, infection  I ordered, reviewed and interpreted labs, which included CBC with normal white count, low stable hemoglobin, chemistries with low potassium normal renal function, lactate elevated but trending back to normal, troponins flat, blood culture sent, urinalysis without signs of infection I ordered medication IV fluids and reviewed PMP when indicated. I ordered imaging studies which included chest x-ray and I independently    visualized and interpreted imaging which showed atelectasis left base Previous records obtained and reviewed in epic including cardiology note from earlier today I consulted [facility] hospitalist Dr. Dorthula Perfect And discussed lab and imaging findings and discussed disposition.  Cardiac monitoring reviewed, sinus rhythm Social determinants considered, patient with social isolation and physically inactive Critical Interventions: Workup and treatment of patient's hypotension and hypothermia  After the interventions stated above, I reevaluated the patient and found patient's blood pressure to be trending up Admission and further testing considered, he would benefit from continuing stabilization of his vital signs.  Patient in agreement with plan for admission.         Final Clinical Impression(s) / ED Diagnoses Final diagnoses:  Hypotension due to hypovolemia  SIRS (systemic inflammatory response syndrome) (HCC)  Hypothermia, initial encounter    Rx / DC Orders ED Discharge Orders     None         Terrilee Files, MD 05/23/23 [phone]

## 2023-05-22 NOTE — Assessment & Plan Note (Signed)
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 Lovell Sheehan on 01/20/2023 "

## 2023-05-22 NOTE — Assessment & Plan Note (Signed)
Present at admission - Following with wound care outpatient - Wound consult while in the hospital

## 2023-05-22 NOTE — Assessment & Plan Note (Signed)
-   Replace and recheck - Likely secondary to poor p.o. intake

## 2023-05-22 NOTE — Assessment & Plan Note (Addendum)
-   Continue Eliquis - Holding metoprolol for bradycardia and hypotension

## 2023-05-22 NOTE — Assessment & Plan Note (Signed)
-   Patient complaining of a crampy pain - Bentyl offered no relief - CT abdomen pelvis for completeness - Lactic acid was trending down - Physical exam reassuring with a soft abdomen and no guarding with palpation - Continue analgesics as needed - Patient reports every time he stops his PPI he has pain, continue PPI - Continue to monitor

## 2023-05-22 NOTE — Assessment & Plan Note (Addendum)
-   Likely dehydration contributing - Also manual pressure is reading better than the monitor pressure so patient's blood pressures currently systolic 110s which is still soft but not in the 70s like the pressure is reading on the monitor - Holding antihypertensives - Continue IV fluids - Continue to monitor

## 2023-05-23 ENCOUNTER — Inpatient Hospital Stay (HOSPITAL_COMMUNITY): Payer: Medicare HMO

## 2023-05-23 ENCOUNTER — Other Ambulatory Visit: Payer: Self-pay

## 2023-05-23 DIAGNOSIS — R7881 Bacteremia: Secondary | ICD-10-CM | POA: Diagnosis not present

## 2023-05-23 DIAGNOSIS — Z87891 Personal history of nicotine dependence: Secondary | ICD-10-CM

## 2023-05-23 DIAGNOSIS — K2980 Duodenitis without bleeding: Secondary | ICD-10-CM

## 2023-05-23 DIAGNOSIS — R188 Other ascites: Secondary | ICD-10-CM

## 2023-05-23 DIAGNOSIS — R6510 Systemic inflammatory response syndrome (SIRS) of non-infectious origin without acute organ dysfunction: Secondary | ICD-10-CM | POA: Diagnosis not present

## 2023-05-23 DIAGNOSIS — E876 Hypokalemia: Secondary | ICD-10-CM | POA: Diagnosis not present

## 2023-05-23 DIAGNOSIS — K859 Acute pancreatitis without necrosis or infection, unspecified: Secondary | ICD-10-CM

## 2023-05-23 DIAGNOSIS — K2970 Gastritis, unspecified, without bleeding: Secondary | ICD-10-CM | POA: Diagnosis not present

## 2023-05-23 DIAGNOSIS — Z7189 Other specified counseling: Secondary | ICD-10-CM | POA: Diagnosis not present

## 2023-05-23 DIAGNOSIS — Z515 Encounter for palliative care: Secondary | ICD-10-CM | POA: Diagnosis not present

## 2023-05-23 DIAGNOSIS — R140 Abdominal distension (gaseous): Secondary | ICD-10-CM

## 2023-05-23 DIAGNOSIS — I480 Paroxysmal atrial fibrillation: Secondary | ICD-10-CM | POA: Diagnosis not present

## 2023-05-23 LAB — LIPID PANEL
Cholesterol: 122 mg/dL (ref 0–200)
HDL: 34 mg/dL — ABNORMAL LOW (ref >40)
LDL Cholesterol: 72 mg/dL (ref 0–99)
Total CHOL/HDL Ratio: 3.6 {ratio}
Triglycerides: 80 mg/dL (ref <150)
VLDL: 16 mg/dL (ref 0–40)

## 2023-05-23 LAB — COMPREHENSIVE METABOLIC PANEL
ALT: 14 U/L (ref 0–44)
AST: 12 U/L — ABNORMAL LOW (ref 15–41)
Albumin: <1.5 g/dL — ABNORMAL LOW (ref 3.5–5.0)
Alkaline Phosphatase: 70 U/L (ref 38–126)
Anion gap: 5 (ref 5–15)
BUN: 9 mg/dL (ref 8–23)
CO2: 28 mmol/L (ref 22–32)
Calcium: 7.6 mg/dL — ABNORMAL LOW (ref 8.9–10.3)
Chloride: 108 mmol/L (ref 98–111)
Creatinine, Ser: 0.66 mg/dL (ref 0.61–1.24)
GFR, Estimated: >60 mL/min (ref >60)
Glucose, Bld: 58 mg/dL — ABNORMAL LOW (ref 70–99)
Potassium: 3.2 mmol/L — ABNORMAL LOW (ref 3.5–5.1)
Sodium: 141 mmol/L (ref 135–145)
Total Bilirubin: 0.3 mg/dL (ref 0.3–1.2)
Total Protein: 3.9 g/dL — ABNORMAL LOW (ref 6.5–8.1)

## 2023-05-23 LAB — MAGNESIUM: Magnesium: 1.9 mg/dL (ref 1.7–2.4)

## 2023-05-23 LAB — CBC WITH DIFFERENTIAL/PLATELET
Abs Immature Granulocytes: 0.01 10*3/uL (ref 0.00–0.07)
Basophils Absolute: 0 10*3/uL (ref 0.0–0.1)
Basophils Relative: 0 %
Eosinophils Absolute: 0 10*3/uL (ref 0.0–0.5)
Eosinophils Relative: 0 %
HCT: 27.4 % — ABNORMAL LOW (ref 39.0–52.0)
Hemoglobin: 8.7 g/dL — ABNORMAL LOW (ref 13.0–17.0)
Immature Granulocytes: 0 %
Lymphocytes Relative: 31 %
Lymphs Abs: 1.7 10*3/uL (ref 0.7–4.0)
MCH: 30 pg (ref 26.0–34.0)
MCHC: 31.8 g/dL (ref 30.0–36.0)
MCV: 94.5 fL (ref 80.0–100.0)
Monocytes Absolute: 0.4 10*3/uL (ref 0.1–1.0)
Monocytes Relative: 7 %
Neutro Abs: 3.3 10*3/uL (ref 1.7–7.7)
Neutrophils Relative %: 62 %
Platelets: 172 10*3/uL (ref 150–400)
RBC: 2.9 MIL/uL — ABNORMAL LOW (ref 4.22–5.81)
RDW: 16.1 % — ABNORMAL HIGH (ref 11.5–15.5)
WBC: 5.4 10*3/uL (ref 4.0–10.5)
nRBC: 0 % (ref 0.0–0.2)

## 2023-05-23 LAB — GLUCOSE, CAPILLARY
Glucose-Capillary: 105 mg/dL — ABNORMAL HIGH (ref 70–99)
Glucose-Capillary: 37 mg/dL — CL (ref 70–99)
Glucose-Capillary: 37 mg/dL — CL (ref 70–99)
Glucose-Capillary: 49 mg/dL — ABNORMAL LOW (ref 70–99)
Glucose-Capillary: 54 mg/dL — ABNORMAL LOW (ref 70–99)
Glucose-Capillary: 72 mg/dL (ref 70–99)
Glucose-Capillary: 87 mg/dL (ref 70–99)
Glucose-Capillary: 95 mg/dL (ref 70–99)

## 2023-05-23 LAB — LIPASE, BLOOD
Lipase: 214 U/L — ABNORMAL HIGH (ref 11–51)
Lipase: 206 U/L — ABNORMAL HIGH (ref 11–51)

## 2023-05-23 LAB — MRSA NEXT GEN BY PCR, NASAL: MRSA by PCR Next Gen: NOT DETECTED

## 2023-05-23 MED ORDER — DEXTROSE 50 % IV SOLN: 25.0000 g | Freq: Once | INTRAVENOUS | Status: AC

## 2023-05-23 MED ORDER — DEXTROSE 50 % IV SOLN
INTRAVENOUS | Status: AC
  Filled 2023-05-23: qty 50

## 2023-05-23 MED ORDER — POTASSIUM CHLORIDE 10 MEQ/100ML IV SOLN
10.0000 meq | INTRAVENOUS | Status: AC
  Administered 2023-05-23 (×4): 10 meq via INTRAVENOUS
  Filled 2023-05-23 (×4): qty 100

## 2023-05-23 MED ORDER — CHLORHEXIDINE GLUCONATE CLOTH 2 % EX PADS
6.0000 | MEDICATED_PAD | Freq: Every day | CUTANEOUS | Status: DC
  Administered 2023-05-23 – 2023-05-29 (×7): 6 via TOPICAL

## 2023-05-23 MED ORDER — DEXTROSE 50 % IV SOLN
INTRAVENOUS | Status: AC
  Administered 2023-05-23: 25 g via INTRAVENOUS
  Administered 2023-05-23: 50 mL
  Filled 2023-05-23: qty 50

## 2023-05-23 MED ORDER — MORPHINE SULFATE (PF) 2 MG/ML IV SOLN: 1.0000 mg | INTRAVENOUS | Status: DC | PRN

## 2023-05-23 MED ORDER — LORAZEPAM 2 MG/ML IJ SOLN
0.5000 mg | INTRAMUSCULAR | Status: DC | PRN
  Administered 2023-05-23 – 2023-05-24 (×4): 0.5 mg via INTRAVENOUS
  Filled 2023-05-23 (×4): qty 1

## 2023-05-23 MED ORDER — DEXTROSE-SODIUM CHLORIDE 5-0.9 % IV SOLN: INTRAVENOUS | Status: DC

## 2023-05-23 MED ORDER — MELATONIN 3 MG PO TABS
6.0000 mg | ORAL_TABLET | Freq: Once | ORAL | Status: AC
  Administered 2023-05-23: 6 mg via ORAL
  Filled 2023-05-23: qty 2

## 2023-05-23 MED ORDER — DEXTROSE 50 % IV SOLN
1.0000 | Freq: Once | INTRAVENOUS | Status: AC
  Administered 2023-05-23: 50 mL via INTRAVENOUS
  Filled 2023-05-23: qty 50

## 2023-05-23 NOTE — Progress Notes (Signed)
Hypoglycemic Event  CBG: 54   Treatment: D50 50 mL (25 gm)  Symptoms: None  Follow-up CBG: Time:0015 CBG Result:101   Possible Reasons for Event: Inadequate meal intake  Comments/MD notified:Dr. A. Zierle-Ghosh     Jim Ramirez

## 2023-05-23 NOTE — Plan of Care (Signed)

## 2023-05-23 NOTE — Progress Notes (Signed)
Hypoglycemic Event  CBG: 49   Treatment: D50 50 mL (25 gm)  Symptoms: None  Follow-up CBG: Time:2015 CBG Result:105  Possible Reasons for Event: Inadequate meal intake  Comments/MD notified:Dr. A. Zierle-Ghosh    Jim Ramirez

## 2023-05-23 NOTE — Consult Note (Addendum)
WOC Nurse Consult Note: this patient is familiar to WOC team from previous admission; this consult is performed remotely utilizing EMR records including photo documentation of wounds  Reason for Consult: pressure injuries  Wound type: 1. 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  Stage 3  vertebral column 50% pink 50% scattered yellow fibrinous material noted to have epibole of wound edges  2.  L heel Stage 3 80% pink moist 20% tan fibrinous material  Drainage (amount, consistency, odor)  see nursing flowsheet  Periwound: appears intact  Dressing procedure/placement/frequency: Clean vertebral column and L heel wound with Vashe wound cleanser [facility] [place] [phone]), then using Q tip applicator pack wounds with Vashe moistened gauze making sure to cover depth and any undermining.  Top with dry gauze and silicone foam to vertebral column. Top with dry gauze and Kerlix roll gauze to left heel.   POC discussed with bedside nurse. WOC team will not follow at this time. Re-consult if further needs arise.   Thank you,    Priscella Mann MSN, RN-BC, [employer] [phone]

## 2023-05-23 NOTE — Hospital Course (Addendum)
72 y.o. 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  Patient was seen in the cardiology office prior to arrival where his blood pressure was reportedly 70/40 with dizziness and fatigue.  He was sent to the ER from there.  Patient reports he is actually here for abdominal pain.  He reports that he has abdominal pain that has been intermittent for 2 days.  It is unpredictable with no identified provoking or relieving factors.  He describes it as a crampy pain.  He has taken Pepto, Tylenol, Advil at home with no relief.  He reports whenever he stops his PPI he has "trouble."  He reports he was recently taken off of his Nexium for unknown reasons.  He was given Bentyl at admission that did not help.  Morphine seems to have helped.  He denies nausea, vomiting, diarrhea.  He reports constipation but then reports his last bowel movement was normal and it was 1 or 2 days ago.  He has had no fever.  As far as his hypotension goes he complains of dizziness and fatigue.  He reports he comes here from [facility] rehab where he is mostly nonambulatory.  He reports the dizziness is when he sits up.  He denies chest pain, but reports palpitations.  The palpitations have been present for 2 weeks.  He also cannot identify anything that provokes those.  He reports a decreased appetite.  He eats about half a meal twice per day.  He has decreased liquid p.o. intake as well.  On review of systems patient does report he has had 2-3 months of peripheral edema.  Patient has no other complaints at this time.   He does not smoke and does not drink.  Patient reports he would want to be full code.  ACP  documents were reviewed which specify a decision maker but no limitations.

## 2023-05-23 NOTE — Progress Notes (Signed)
Dr Laural Benes aware that patient urine is continues to be red in color with small sediment in it since foley was placed on night shift. Patient currently denies any abdominal pain.

## 2023-05-23 NOTE — Progress Notes (Signed)
   05/23/23 1025  Spiritual Encounters  Type of Visit Initial  Care provided to: Patient  Reason for visit Routine spiritual support  OnCall Visit No    Chaplain visited with the patient, Jim Ramirez, during rounding on the unit. He reports he is doing ok and that his family just left. It was time for him to receive care. I wished him a peaceful day as I departed.  Valerie Roys [facility]  [phone]

## 2023-05-23 NOTE — Progress Notes (Addendum)
CBG check at 1539 was 37, rechecked at 1541 correct at 37. Patient alert and oriented, wanting a peanut butter and jelly sandwich. Explained due to NPO status, unable to give. Notified Dr Laural Benes of CBG result. D50 amp ordered and given. IV fluids ordered to be changed. CBG recheck post D50 push being given was 95 at 1604. Dr Laural Benes aware.

## 2023-05-23 NOTE — Consult Note (Signed)
Gastroenterology Consult   Referring Provider: No ref. provider found Primary Care Physician:  [name], MD Primary Gastroenterologist:  Dr. Marletta Lor (previously unassigned)  Patient ID: Jim Ramirez; [id number]; October 01, 1950   Admit date: 05/22/2023  LOS: 1 day   Date of Consultation: 05/23/2023  Reason for Consultation:  pancreatitis  History of Present Illness   Jim Ramirez is a 72 y.o. year old male with history of diabetes, paroxysmal A-fib on Eliquis, HTN, DVT with IVC filter, acute spontaneous SAH, discitis and osteomyelitis admission in June with epidural abscess and stenosis with MSSA bacteremia with negative TEE, OSA, and depression who presented to the ED at the recommendation of cardiology office given symptomatic hypotension suspected to be secondary to hypovolemia.  GI consulted given patient's reports of abdominal pain and imaging findings.   ED Course: Temp 93.7 (bair hugger applied). HR 50-69, hypotensive (BP 70/40 at office, improved post 1L IVB).  Labs -troponin 4, Hgb 10.1 (stable), platelets 203, BNP 96, lactate 2.  Blood cultures pending, lipase 214 UA unremarkable CXR with left basilar opacity related to elevated hemidiaphragm and streaky right infrahilar atelectasis CT A/P without contrast -diffusely enlarged pancreas with edema and throughout the upper abdomen, no evidence of biliary dilation, no loculated collections identified.  Diffusely thickened gastric wall, possible gastritis.  Retroperitoneal portion of duodenum thickening as well, likely reactive secondary to pancreatitis.  Stool-filled colon without abnormal distention.  Moderate ascites.  Avascular necrosis of hips.  Consult: Patient reports he has been having 3-4 days of abdominal pain as well as some nausea but denies any vomiting.  He also reports for the last several days he has not felt like eating much and has not been taking in much protein.  He denies any family history of pancreatic  cancer or any other GI cancer.  He denies any recent alcohol use or tobacco use.  Per his chart he is a former smoker but quit in 1994.  He denies any chest pain, shortness of breath, melena, or BRBPR.  Prior endoscopic workup: EGD 09/23/07: - normal esophagus - superficial antral erosions associated with diffuse erythema of the antrum.  - multiple duodenal ulcers at junction of D1 and D2 with edema and mild narrowing with mild resistance on passage of scope - start daily ppi, gerd diet, avoid NSAIDs and Mobic.   No prior colonoscopy on file.   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   CTA negative for aneurysm   Depressed    Diabetes mellitus without complication (HCC)    DVT (deep venous thrombosis) (HCC)    History of kidney stones    Hypertension    Nephrolithiasis    Sleep apnea     Past Surgical History:  Procedure Laterality Date   ANTERIOR CERVICAL DECOMP/DISCECTOMY FUSION N/A 05/22/2022   Procedure: ANTERIOR CERVICAL DISCECTOMY FUSION, INTERBODY PROSTHESIS,PLATE/SCREWS CERVICAL THREE-FOUR, CERVICAL FOUR-FIVE;REMOVAL CERVICAL PLATE;  Surgeon: Tressie Stalker, MD;  Location: [facility] OR;  Service: Neurosurgery;  Laterality: N/A;  3C   CATARACT EXTRACTION W/PHACO Left 09/23/2022   Procedure: CATARACT EXTRACTION PHACO AND INTRAOCULAR LENS PLACEMENT (IOC);  Surgeon: Fabio Pierce, MD;  Location: [facility] ORS;  Service: Ophthalmology;  Laterality: Left;  CDE: 7.52   CATARACT EXTRACTION W/PHACO Right 10/07/2022   Procedure: CATARACT EXTRACTION PHACO AND INTRAOCULAR LENS PLACEMENT (IOC);  Surgeon: Fabio Pierce, MD;  Location: [facility] ORS;  Service: Ophthalmology;  Laterality: Right;  CDE: 9.40   IR IVC FILTER PLMT / S&I /  IMG GUID/MOD SED  01/19/2023   LUMBAR LAMINECTOMY/DECOMPRESSION MICRODISCECTOMY N/A 01/20/2023   Procedure: LUMBAR LAM FOR EPIDURAL ABSCESS, LUMBAR THREE-FOUR, LUMBAR FOUR-FIVE,  LUMBAR FIVE-SACRAL ONE;  Surgeon:  Tressie Stalker, MD;  Location: [facility] OR;  Service: Neurosurgery;  Laterality: N/A;   STOMACH SURGERY     TEE WITHOUT CARDIOVERSION N/A 12/31/2022   Procedure: TRANSESOPHAGEAL ECHOCARDIOGRAM;  Surgeon: Little Ishikawa, MD;  Location: [facility] INVASIVE CV LAB;  Service: Cardiovascular;  Laterality: N/A;   TONSILLECTOMY  2000    Prior to Admission medications   Medication Sig Start Date End Date Taking? Authorizing Provider  albuterol (VENTOLIN HFA) 108 (90 Base) MCG/ACT inhaler Inhale 2 puffs into the lungs every 6 (six) hours as needed for wheezing or shortness of breath.   Yes [provider]  apixaban (ELIQUIS) 5 MG TABS tablet Take 1 tablet (5 mg total) by mouth 2 (two) times daily. 05/22/23  Yes BranchDorothe Pea, MD  ascorbic acid (VITAMIN C) 500 MG tablet Take 500 mg by mouth daily.   Yes [provider]  cefadroxil (DURICEF) 500 MG capsule Take 2 capsules (1,000 mg total) by mouth 2 (two) times daily. Continue for long term 02/19/23  Yes Adhikari, Willia Craze, MD  clotrimazole-betamethasone (LOTRISONE) cream Apply 1 Application topically 2 (two) times daily. Apply to sacrum and groin topically for wound care   Yes [provider]  esomeprazole (NEXIUM) 40 MG capsule Take 40 mg by mouth daily.   Yes [provider]  melatonin 3 MG TABS tablet Take 3 mg by mouth at bedtime.   Yes [provider]  metoprolol tartrate (LOPRESSOR) 25 MG tablet Take 0.5 tablets (12.5 mg total) by mouth 2 (two) times daily. 01/27/23  Yes Burnadette Pop, MD  Nutritional Supplements (PROMOD) LIQD Take 30 mLs by mouth in the morning and at bedtime. For wound healing   Yes [provider]  oxyCODONE (OXY IR/ROXICODONE) 5 MG immediate release tablet Take 1 tablet (5 mg total) by mouth every 6 (six) hours as needed for moderate pain or severe pain. 02/26/23  Yes [name], MD  tamsulosin (FLOMAX) 0.4 MG CAPS capsule Take 0.4 mg by mouth daily.   Yes [provider]  Zinc 50 MG TABS Take 50 mg by mouth daily.   Yes [provider]    Current Facility-Administered Medications  Medication Dose Route Frequency Provider Last Rate Last Admin   0.9 %  sodium chloride infusion   Intravenous Continuous Laural Benes, Clanford L, MD 70 mL/hr at 05/23/23 0754 Rate Change at 05/23/23 0754   acetaminophen (TYLENOL) tablet 650 mg  650 mg Oral Q6H PRN Zierle-Ghosh, Asia B, DO       Or   acetaminophen (TYLENOL) suppository 650 mg  650 mg Rectal Q6H PRN Zierle-Ghosh, Asia B, DO       apixaban (ELIQUIS) tablet 5 mg  5 mg Oral BID Zierle-Ghosh, Asia B, DO   5 mg at 05/22/23 2349   cefadroxil (DURICEF) capsule 1,000 mg  1,000 mg Oral BID Zierle-Ghosh, Asia B, DO   1,000 mg at 05/22/23 2359   Chlorhexidine Gluconate Cloth 2 % PADS 6 each  6 each Topical Daily Zierle-Ghosh, Asia B, DO       melatonin tablet 3 mg  3 mg Oral QHS Zierle-Ghosh, Asia B, DO   3 mg at 05/22/23 2350   morphine (PF) 2 MG/ML injection 1 mg  1 mg Intravenous Q3H PRN Cleora Fleet, MD  ondansetron (ZOFRAN) tablet 4 mg  4 mg Oral Q6H PRN Zierle-Ghosh, Asia B, DO       Or   ondansetron (ZOFRAN) injection 4 mg  4 mg Intravenous Q6H PRN Zierle-Ghosh, Asia B, DO       oxyCODONE (Oxy IR/ROXICODONE) immediate release tablet 5 mg  5 mg Oral Q4H PRN Zierle-Ghosh, Asia B, DO   5 mg at 05/23/23 0222   pantoprazole (PROTONIX) injection 40 mg  40 mg Intravenous Q24H Zierle-Ghosh, Asia B, DO   40 mg at 05/22/23 2351   potassium chloride 10 mEq in 100 mL IVPB  10 mEq Intravenous Q1 Hr x 4 Johnson, Clanford L, MD 100 mL/hr at 05/23/23 0852 10 mEq at 05/23/23 0852    Allergies as of 05/22/2023   (No Known Allergies)    Family History  Problem Relation Age of Onset   Heart attack Mother    Heart attack Father    Hypertension Brother    Diabetes Brother    Coronary artery disease Brother     Social History   Socioeconomic History   Marital status: Significant Other    Spouse name: Not on  file   Number of children: 2   Years of education: Not on file   Highest education level: Not on file  Occupational History   Not on file  Tobacco Use   Smoking status: Former    Current packs/day: 0.00    Types: Cigarettes    Quit date: 1994    Years since quitting: 30.8    Passive exposure: Never   Smokeless tobacco: Never  Vaping Use   Vaping status: Never Used  Substance and Sexual Activity   Alcohol use: Not Currently   Drug use: Never   Sexual activity: Yes  Other Topics Concern   Not on file  Social History Narrative   Not on file   Social Determinants of Health   Financial Resource Strain: Low Risk  (05/01/2023)   Received from [facility]   Overall Financial Resource Strain (CARDIA)    Difficulty of Paying Living Expenses: Not hard at all  Food Insecurity: No Food Insecurity (05/22/2023)   Hunger Vital Sign    Worried About Running Out of Food in the Last Year: Never true    Ran Out of Food in the Last Year: Never true  Transportation Needs: No Transportation Needs (05/22/2023)   PRAPARE - Administrator, Civil Service (Medical): No    Lack of Transportation (Non-Medical): No  Physical Activity: Inactive (05/01/2023)   Received from [facility]   Exercise Vital Sign    Days of Exercise per Week: 0 days    Minutes of Exercise per Session: 0 min  Stress: No Stress Concern Present (05/01/2023)   Received from [facility] - Occupational Stress Questionnaire    Feeling of Stress : Not at all  Social Connections: Socially Isolated (05/01/2023)   Received from [facility]   Social Connection and Isolation Panel [NHANES]    Frequency of Communication with Friends and Family: Never    Frequency of Social Gatherings with Friends and Family: More than three times a week    Attends Religious Services: Never    Database administrator or Organizations: No    Attends Banker Meetings: Never     Marital Status: Divorced  Catering manager Violence: Not At Risk (05/22/2023)   Humiliation, Afraid, Rape, and Kick  questionnaire    Fear of Current or Ex-Partner: No    Emotionally Abused: No    Physically Abused: No    Sexually Abused: No     Review of Systems   Gen: + fatigue. Denies any fever, chills, loss of appetite, change in weight or weight loss CV: + edema. Denies chest pain, heart palpitations, syncope, edema  Resp: Denies shortness of breath with rest, cough, wheezing, coughing up blood, and pleurisy. GI: see HPI GU : Denies urinary burning, blood in urine, urinary frequency, and urinary incontinence. MS: + immobility, atrophy Derm: + wounds. Denies rash, itching, dry skin, hives. Psych: + Anxiety.  Denies depression, memory loss, hallucinations, and confusion. Heme: Denies bruising or bleeding Neuro:  + dizziness, anxiety. Denies any headaches, dizziness, paresthesias, shaking  Physical Exam   Vital Signs in last 24 hours: Temp:  [93.7 F (34.3 C)-97.6 F (36.4 C)] 97.3 F (36.3 C) (10/25 0744) Pulse Rate:  [50-72] 69 (10/25 0700) Resp:  [12-20] 14 (10/25 0700) BP: (70-126)/(40-96) 126/64 (10/25 0700) SpO2:  [94 %-100 %] 96 % (10/25 0700) Weight:  [78.5 kg-79.4 kg] 79.4 kg (10/24 1429) Last BM Date : 05/21/23  General:   Alert,  thin appearing, pleasant and cooperative in NAD Head:  Normocephalic and atraumatic. Eyes:  Sclera clear, no icterus.   Conjunctiva pink. Ears:  Normal auditory acuity. Abdomen:  Soft, nontender and nondistended. No masses, hepatosplenomegaly or hernias noted. Normal bowel sounds, without guarding, and without rebound.   Rectal: deferred   Msk:  Symmetrical without gross deformities. Normal posture. Extremities: Wound dressings to left lower extremity. + Pitting edema to BLE Neurologic:  Alert and oriented x4. Skin:  Intact without significant lesions or rashes. Psych:  Alert and cooperative. Normal mood and  affect.  Intake/Output from previous day: 10/24 0701 - 10/25 0700 In: 502.2 [I.V.:1; IV Piggyback:501.2] Out: -  Intake/Output this shift: Total I/O In: -  Out: 350 [Urine:350]  Labs/Studies   Recent Labs Recent Labs    05/22/23 1440 05/23/23 0447  WBC 6.3 5.4  HGB 10.1* 8.7*  HCT 31.8* 27.4*  PLT 203 172   BMET Recent Labs    05/22/23 1440 05/23/23 0447  NA 140 141  K 3.3* 3.2*  CL 104 108  CO2 30 28  GLUCOSE 137* 58*  BUN 9 9  CREATININE 0.73 0.66  CALCIUM 8.0* 7.6*   LFT Recent Labs    05/22/23 1440 05/23/23 0447  PROT 4.5* 3.9*  ALBUMIN 1.6* <1.5*  AST 16 12*  ALT 17 14  ALKPHOS 80 70  BILITOT 0.2* 0.3   PT/INR No results for input(s): "LABPROT", "INR" in the last 72 hours. Hepatitis Panel No results for input(s): "HEPBSAG", "HCVAB", "HEPAIGM", "HEPBIGM" in the last 72 hours. C-Diff No results for input(s): "CDIFFTOX" in the last 72 hours.  Radiology/Studies CT ABDOMEN PELVIS WO CONTRAST  Result Date: 05/22/2023 CLINICAL DATA:  Acute nonlocalized abdominal pain. EXAM: CT ABDOMEN AND PELVIS WITHOUT CONTRAST TECHNIQUE: Multidetector CT imaging of the abdomen and pelvis was performed following the standard protocol without IV contrast. RADIATION DOSE REDUCTION: This exam was performed according to the departmental dose-optimization program which includes automated exposure control, adjustment of the mA and/or kV according to patient size and/or use of iterative reconstruction technique. COMPARISON:  CT chest abdomen and pelvis 12/22/2022 FINDINGS: Lower chest: Small bilateral pleural effusions with basilar atelectasis or consolidation, greater on the left. This could represent compressive atelectasis or pneumonia. Elevation of the left hemidiaphragm. Hepatobiliary: No focal  liver abnormality is seen. No gallstones, gallbladder wall thickening, or biliary dilatation. Pancreas: The pancreas is diffusely enlarged with edema around the pancreas and throughout  the upper abdomen. Appearances are likely to represent acute pancreatitis. No loculated collections are identified. Spleen: Normal in size without focal abnormality. Adrenals/Urinary Tract: No adrenal gland nodules. Kidneys are symmetrical. Bilateral intrarenal stones. Largest on the left measures 4 mm diameter. No hydronephrosis or hydroureter. Bladder wall is diffusely thickened, likely cystitis or outlet obstruction. Correlate with urinalysis. Stomach/Bowel: Stomach is decompressed. The gastric wall is diffusely thickened. This could be due to under distention or may indicate gastritis. Small bowel are decompressed. There is wall thickening of the retroperitoneal portion of the duodenal, likely reactive inflammation. Stool-filled colon without abnormal distention. Appendix is not identified. Vascular/Lymphatic: Calcification of the abdominal aorta. No aneurysm. IVC filter. No significant lymphadenopathy. Reproductive: Prostate is unremarkable. Other: Moderate free fluid in the abdomen and pelvis, likely ascites. No free air. Prominent diffuse soft tissue edema in the subcutaneous fat. Musculoskeletal: Degenerative changes in the spine and hips. Areas of lucency and sclerosis in the L4, L5, and S1 vertebrae. This is likely degenerative but could indicate metastatic disease in the appropriate clinical setting. Curvilinear sclerosis in the femoral head suggest avascular necrosis. IMPRESSION: 1. Diffuse enlargement of the pancreas with peripancreatic edema likely representing acute pancreatitis. Wall thickening of the duodenal is likely reactive. No loculated collections. 2. Diffuse gastric wall thickening may be due to under distention or gastritis. 3. Small bilateral pleural effusions with basilar atelectasis or consolidation, greater on the left. 4. Diffuse bladder wall thickening, possibly under outlet obstruction or cystitis. Correlate with urinalysis. 5. Moderate ascites. 6. Aortic atherosclerosis. 7. 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 Electronically Signed   By: Burman Nieves M.D.   On: 05/22/2023 23:51   DG Chest Port 1 View  Result Date: 05/22/2023 CLINICAL DATA:  Weakness. EXAM: PORTABLE CHEST 1 VIEW COMPARISON:  05/01/2023, chest CT 12/23/2023 FINDINGS: Stable heart size and mediastinal contours. Left basilar opacity is again seen, elevated hemidiaphragm and atelectasis on prior CT. There may be a small left pleural effusion. Streaky right infrahilar atelectasis. No pulmonary edema. No pneumothorax. IMPRESSION: 1. Left basilar opacity is in part related to elevated hemidiaphragm and atelectasis when compared prior CT. There may be a small left pleural effusion. 2. Streaky right infrahilar atelectasis. Electronically Signed   By: Narda Rutherford M.D.   On: 05/22/2023 16:27     Assessment   Jim Ramirez is a 72 y.o. year old male with history of diabetes, paroxysmal A-fib on Eliquis, HTN, DVT with IVC filter, acute spontaneous SAH, discitis and osteomyelitis admission in June with epidural abscess and stenosis with MSSA bacteremia with negative TEE, OSA, and depression who presented to the ED at the recommendation of cardiology office given symptomatic hypotension suspected to be secondary to hypovolemia.  GI consulted to assist in further management and evaluation of pancreatitis.  Pancreatitis:  - Triglycerides normal - Alcohol/tobacco use -none currently.  Previous tobacco use, quit 1994 - No hypercalcemia - LFTs and Bilirubin wnl. Hypotensive on arrival (suspect dehydration). No other sign of cholangitis - gallbladder in situ - Home medications not a factor - Differentials: gallstones, duodenal stricturing, infection, malignancy, autoimmune (less likely) -Will obtain RUQ [place] to rule out cholelithiasis/choledocholithiasis given noncontrasted CT performed -If negative [place] may consider IgG4 and CA 19-9 to further delineate etiology given patient's age.  He will benefit from repeat imaging with  contrast in the near future, if  no improvement plan for repeat scan on Monday  Hypokalemia:  - Potassium low at 3.2 - Likely secondary to decreased p.o. intake  Plan / Recommendations   RUQ [place] Consider IgG4 (despite age) and CA 19-9 if other testing negative Supportive measures with IV fluids and pain medications as needed (at least 1.9ml/kg/hr fluids) Needs contrasted study - Outpatient CT with contrast/pancreatic protocol in 6 weeks vs MRCP, if no improvement buy Monday then can repeat inpatient.  Outpatient EGD for further evaluation of gastric wall thickening Potassium replacement per attending Anti anxiety medication per hospitalist.     05/23/2023, 8:56 AM  Brooke Bonito, MSN, FNP-BC, AGACNP-BC [facility]

## 2023-05-23 NOTE — Consult Note (Signed)
Consultation Note Date: 05/23/2023   Patient Name: Jim Ramirez  DOB: Mar 21, 1951  MRN: [id number]  Age / Sex: 72 y.o., male  PCP: [name], MD Referring Physician: Cleora Fleet, MD  Reason for Consultation: Establishing goals of care  HPI/Patient Profile: 72 y.o. male  with past medical history of diabetes, paroxysmal atrial fibrillation on Eliquis, hypertension, DVT with IVC filter, acute spontaneous subarachnoid intracranial hemorrhage, hospitalization in June 2024 for discitis/osteomyelitis with epidural abscess and stenosis with MSSA bacteremia admitted on 05/22/2023 with hypotension, dizziness, fatigue, abdominal pain with SIRS. Ongoing long term antibiotic treatment for known osteomyelitis and also found to have stage 3 pressure injury to R heel.   Clinical Assessment and Goals of Care: Consult received and chart review completed. Discussed with RN. Reviewed notes including previous palliative notes. Noted that he had previously desired DNR status.   I met today with Jim Ramirez along with his sister/HCPOA Harriett Sine and brother at bedside. Jim Ramirez shares with me that he felt he was making progress in rehab and was beginning to be able to walk. He tells me that he is anxious to return back to rehab with a goal to ultimately return back home. I expressed my concern for his overall illness with complicated infection and with wound on right heel now as well. We discussed acute illness with acute pancreatitis and hopes that he can improve from this illness but this will also set him back. We discussed the burden of his health issues and barriers to improvement.   We discussed goals of care. Jim Ramirez continues to desire full code and full scope treatment. We spent time discussing resuscitation and poor outcomes and poor quality of life. Jim Ramirez shares that he wants all measures to prolong life with  no limitations. He also expresses to me his overwhelming anxiety at baseline. I do feel that his anxiety and his fears of dying are guiding his decisions at this time. I did express that prolonging him on life support would not be recommended especially considering his anxiety if he cannot speak and express himself and his concerns. He considers this. Harriett Sine expresses that she will support Jim Ramirez wishes as stated.   We discussed his anxiety and he is on Lexapro. I recommend increase in Lexapro. I do think he would benefit from PRN low dose hydroxyzine or even low dose Xanax at bedtime.   All questions/concerns addressed. Emotional support provided. Discussed with Dr. Laural Benes and RN.   Primary Decision Maker PATIENT    SUMMARY OF RECOMMENDATIONS   - Full code, full scope - Needs ongoing palliative care outpatient conversations - Escalate medications to better control anxiety  Code Status/Advance Care Planning: Full code   Symptom Management:  Anxiety: Recommend increase Lexapro. Consider at bedtime hydroxyzine or Xanax.   Palliative Prophylaxis:  Bowel Regimen, Delirium Protocol, Frequent Pain Assessment, and Palliative Wound Care  Additional Recommendations (Limitations, Scope, Preferences): Full Scope Treatment  Prognosis:  Overall prognosis poor.   Discharge Planning: Skilled Nursing Facility for rehab  with Palliative care service follow-up      Primary Diagnoses: Present on Admission:  SIRS (systemic inflammatory response syndrome) (HCC)  Abdominal pain  Paroxysmal atrial fibrillation with RVR (HCC)  MSSA bacteremia  Hypokalemia   I have reviewed the medical record, interviewed the patient and family, and examined the patient. The following aspects are pertinent.  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   CTA negative for aneurysm   Depressed    Diabetes mellitus without complication (HCC)     DVT (deep venous thrombosis) (HCC)    History of kidney stones    Hypertension    Nephrolithiasis    Sleep apnea    Social History   Socioeconomic History   Marital status: Significant Other    Spouse name: Not on file   Number of children: 2   Years of education: Not on file   Highest education level: Not on file  Occupational History   Not on file  Tobacco Use   Smoking status: Former    Current packs/day: 0.00    Types: Cigarettes    Quit date: 34    Years since quitting: 30.8    Passive exposure: Never   Smokeless tobacco: Never  Vaping Use   Vaping status: Never Used  Substance and Sexual Activity   Alcohol use: Not Currently   Drug use: Never   Sexual activity: Yes  Other Topics Concern   Not on file  Social History Narrative   Not on file   Social Determinants of Health   Financial Resource Strain: Low Risk  (05/01/2023)   Received from [facility]   Overall Financial Resource Strain (CARDIA)    Difficulty of Paying Living Expenses: Not hard at all  Food Insecurity: No Food Insecurity (05/22/2023)   Hunger Vital Sign    Worried About Running Out of Food in the Last Year: Never true    Ran Out of Food in the Last Year: Never true  Transportation Needs: No Transportation Needs (05/22/2023)   PRAPARE - Administrator, Civil Service (Medical): No    Lack of Transportation (Non-Medical): No  Physical Activity: Inactive (05/01/2023)   Received from [facility]   Exercise Vital Sign    Days of Exercise per Week: 0 days    Minutes of Exercise per Session: 0 min  Stress: No Stress Concern Present (05/01/2023)   Received from [facility] - Occupational Stress Questionnaire    Feeling of Stress : Not at all  Social Connections: Socially Isolated (05/01/2023)   Received from [facility]   Social Connection and Isolation Panel [NHANES]    Frequency of Communication with Friends and Family:  Never    Frequency of Social Gatherings with Friends and Family: More than three times a week    Attends Religious Services: Never    Database administrator or Organizations: No    Attends Engineer, structural: Never    Marital Status: Divorced   Family History  Problem Relation Age of Onset   Heart attack Mother    Heart attack Father    Hypertension Brother    Diabetes Brother    Coronary artery disease Brother    Scheduled Meds:  apixaban  5 mg Oral BID   cefadroxil  1,000 mg Oral BID   Chlorhexidine Gluconate Cloth  6 each Topical Daily   melatonin  3 mg Oral QHS   pantoprazole (PROTONIX) IV  40 mg Intravenous Q24H   Continuous Infusions:  sodium chloride 70 mL/hr at 05/23/23 0754   potassium chloride 10 mEq (05/23/23 0852)   PRN Meds:.acetaminophen **OR** acetaminophen, morphine injection, ondansetron **OR** ondansetron (ZOFRAN) IV, oxyCODONE No Known Allergies Review of Systems  Constitutional:  Positive for activity change, appetite change and fatigue.  Gastrointestinal:  Positive for abdominal pain.  Neurological:  Positive for weakness.  Psychiatric/Behavioral:  The patient is nervous/anxious.     Physical Exam Vitals and nursing note reviewed.  Constitutional:      General: He is not in acute distress.    Appearance: He is ill-appearing.  Cardiovascular:     Rate and Rhythm: Normal rate.  Pulmonary:     Effort: No tachypnea, accessory muscle usage or retractions.  Abdominal:     Palpations: Abdomen is soft.  Skin:    Coloration: Skin is pale.  Neurological:     Mental Status: He is alert and oriented to person, place, and time.  Psychiatric:        Mood and Affect: Mood is anxious.     Vital Signs: BP 126/64   Pulse 69   Temp (!) 97.3 F (36.3 C) (Axillary)   Resp 14   Ht 6' (1.829 m)   Wt 79.4 kg   SpO2 96%   BMI 23.73 kg/m  Pain Scale: 0-10   Pain Score: Asleep   SpO2: SpO2: 96 % O2 Device:SpO2: 96 % O2 Flow Rate: .   IO:  Intake/output summary:  Intake/Output Summary (Last 24 hours) at 05/23/2023 0924 Last data filed at 05/23/2023 0745 Gross per 24 hour  Intake 502.21 ml  Output 350 ml  Net 152.21 ml    LBM: Last BM Date : 05/21/23 Baseline Weight: Weight: 79.4 kg Most recent weight: Weight: 79.4 kg     Palliative Assessment/Data:     Time Total: 65 min  Greater than 50%  of this time was spent counseling and coordinating care related to the above assessment and plan.  Signed by: Yong Channel, NP Palliative Medicine Team Pager # [phone] (M-F 8a-5p) Team Phone # [phone] (Nights/Weekends)

## 2023-05-23 NOTE — Progress Notes (Signed)
PROGRESS NOTE   Jim Ramirez  ZHY:[id number] DOB: January 21, 1951 DOA: 05/22/2023 PCP: [name], MD   Chief Complaint  Patient presents with   Hypotension   Level of care: Stepdown  Brief Admission History:  72 y.o. 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  Patient was seen in the cardiology office prior to arrival where his blood pressure was reportedly 70/40 with dizziness and fatigue.  He was sent to the ER from there.  Patient reports he is actually here for abdominal pain.  He reports that he has abdominal pain that has been intermittent for 2 days.  It is unpredictable with no identified provoking or relieving factors.  He describes it as a crampy pain.  He has taken Pepto, Tylenol, Advil at home with no relief.  He reports whenever he stops his PPI he has "trouble."  He reports he was recently taken off of his Nexium for unknown reasons.  He was given Bentyl at admission that did not help.  Morphine seems to have helped.  He denies nausea, vomiting, diarrhea.  He reports constipation but then reports his last bowel movement was normal and it was 1 or 2 days ago.  He has had no fever.  As far as his hypotension goes he complains of dizziness and fatigue.  He reports he comes here from [facility] rehab where he is mostly nonambulatory.  He reports the dizziness is when he sits up.  He denies chest pain, but reports palpitations.  The palpitations have been present for 2 weeks.  He also cannot identify anything that provokes those.  He reports a decreased appetite.  He eats about half a meal twice per day.  He has decreased liquid p.o. intake as well.  On  review of systems patient does report he has had 2-3 months of peripheral edema.  Patient has no other complaints at this time.   He does not smoke and does not drink.  Patient reports he would want to be full code.  ACP documents were reviewed which specify a decision maker but no limitations.   Assessment and Plan:  Acute Pancreatitis  - continue supportive measures with IV fluid and pain management - NPO for bowel rest - follow lipase  - he remains in stepdown ICU - he appears generally unwell and prognosis guarded - requested for goals of care discussion with palliative care team, also has very low albumin which is a poor prognostic indicator  SIRS (systemic inflammatory response syndrome) - Patient sent in for hypotension, he also had a temperature of 93.7 - No leukocytosis - Initial lactic acid 2.0 but then 1.1 after a fluid bolus - Chest x-ray shows some atelectasis but no definite infection - Blood cultures pending - UA is not indicative of UTI - Patient is under the treatment of infectious disease and currently getting Duricef - Continue Duricef, which is planned to be continued for at least 10 months following hospitalization for "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 Lovell Sheehan on 01/20/2023 "  Pressure injury of  heel, stage 3 Present at admission - Following with wound care outpatient - Wound consult recommendations ordered   Hypotension - Likely dehydration contributing - Also manual pressure is reading better than the monitor pressure so patient's blood pressures currently systolic 110s which is still soft but not in the 70s like the pressure is reading on the monitor - Holding antihypertensives - Continue IV fluids - Continue to monitor  Paroxysmal  atrial fibrillation with RVR  - Continue Eliquis for full anticoagulation - Holding metoprolol for bradycardia and hypotension  Hypokalemia - Replace and recheck - Likely secondary to poor p.o. 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 Lovell Sheehan on 01/20/2023 "    DVT prophylaxis: apixaban  Code Status: ull  Family Communication:  Disposition: TBD  Consultants:  GI   Procedures:   Antimicrobials:    Subjective: Pt reports that pain medication has helped with abdominal pain.  He generally feels unwell.   Objective: Vitals:   05/23/23 1000 05/23/23 1100 05/23/23 1124 05/23/23 1200  BP: 120/62 137/64  127/65  Pulse: 69 72  75  Resp: 15 14  12   Temp:   97.6 F (36.4 C)   TempSrc:   Oral   SpO2: 98% 98%  98%  Weight:      Height:        Intake/Output Summary (Last 24 hours) at 05/23/2023 1234 Last data filed at 05/23/2023 1223 Gross per 24 hour  Intake 1220.71 ml  Output 700 ml  Net 520.71 ml   Filed Weights   05/22/23 1429  Weight: 79.4 kg   Examination:  General exam: awake, pale, frail appearing, chronically ill appearing male; Appears Uncomfortable  Respiratory system: no increased work of breathing.  Cardiovascular system: normal S1 & S2 heard. No JVD, murmurs, rubs, gallops or clicks. 1+ pedal edema. Gastrointestinal system: Abdomen is diffusely tender. No organomegaly or masses felt. Normal bowel sounds heard. Central nervous system: Alert and oriented. No focal neurological deficits. Extremities: Symmetric 5 x 5  power. Skin: No rashes, lesions or ulcers. Psychiatry: Judgement and insight appear normal. Mood & affect appropriate.   Data Reviewed: I have personally reviewed following labs and imaging studies  CBC: Recent Labs  Lab 05/22/23 1440 05/23/23 0447  WBC 6.3 5.4  NEUTROABS 4.4 3.3  HGB 10.1* 8.7*  HCT 31.8* 27.4*  MCV 94.6 94.5  PLT 203 172    Basic Metabolic Panel: Recent Labs  Lab 05/22/23 1440 05/23/23 0447  NA 140 141  K 3.3* 3.2*  CL 104 108  CO2 30 28  GLUCOSE 137* 58*  BUN 9 9  CREATININE 0.73 0.66  CALCIUM 8.0* 7.6*  MG  --  1.9    CBG: Recent Labs  Lab 05/23/23 0655  GLUCAP 72    Recent Results (from the past 240 hour(s))  Culture, blood (routine x 2)     Status: None (Preliminary result)   Collection Time: 05/22/23  2:45 PM   Specimen: BLOOD  Result Value Ref Range Status   Specimen Description BLOOD RIGHT ASSIST CONTROL  Final   Special Requests   Final    BOTTLES  DRAWN AEROBIC AND ANAEROBIC Blood Culture adequate volume   Culture   Final    NO GROWTH < 24 HOURS Performed at [facility], [address], [place], [place] [address]    Report Status PENDING  Incomplete  Culture, blood (routine x 2)     Status: None (Preliminary result)   Collection Time: 05/22/23  3:18 PM   Specimen: BLOOD  Result Value Ref Range Status   Specimen Description BLOOD BLOOD LEFT HAND AEROBIC BOTTLE ONLY  Final   Special Requests   Final    BOTTLES DRAWN AEROBIC ONLY Blood Culture adequate volume   Culture   Final    NO GROWTH < 24 HOURS Performed at [facility], [address], [place], [place] [address]    Report Status PENDING  Incomplete  MRSA Next Gen by PCR, Nasal     Status: None   Collection Time: 05/22/23 10:10 PM   Specimen: Nasal Mucosa; Nasal Swab  Result Value Ref Range Status   MRSA by PCR Next Gen NOT DETECTED NOT DETECTED Final    Comment: (NOTE) The GeneXpert MRSA Assay (FDA approved for NASAL specimens only), is one component of a  comprehensive MRSA colonization surveillance program. It is not intended to diagnose MRSA infection nor to guide or monitor treatment for MRSA infections. Test performance is not FDA approved in patients less than 58 years old. Performed at [facility], [address], [place], [place] [address]      Radiology Studies: CT ABDOMEN PELVIS WO CONTRAST  Result Date: 05/22/2023 CLINICAL DATA:  Acute nonlocalized abdominal pain. EXAM: CT ABDOMEN AND PELVIS WITHOUT CONTRAST TECHNIQUE: Multidetector CT imaging of the abdomen and pelvis was performed following the standard protocol without IV contrast. RADIATION DOSE REDUCTION: This exam was performed according to the departmental dose-optimization program which includes automated exposure control, adjustment of the mA and/or kV according to patient size and/or use of iterative reconstruction technique. COMPARISON:  CT chest abdomen and pelvis 12/22/2022 FINDINGS: Lower chest: Small bilateral pleural effusions with basilar atelectasis or consolidation, greater on the left. This could represent compressive atelectasis or pneumonia. Elevation of the left hemidiaphragm. Hepatobiliary: No focal liver abnormality is seen. No gallstones, gallbladder wall thickening, or biliary dilatation. Pancreas: The pancreas is diffusely enlarged with edema around the pancreas and throughout the upper abdomen. Appearances are likely to represent acute pancreatitis. No loculated collections are identified. Spleen: Normal in size without focal abnormality. Adrenals/Urinary Tract: No adrenal gland nodules. Kidneys are symmetrical. Bilateral intrarenal stones. Largest on the left measures 4 mm diameter. No hydronephrosis or hydroureter. Bladder wall is diffusely thickened, likely cystitis or outlet obstruction. Correlate with urinalysis. Stomach/Bowel: Stomach is decompressed. The gastric wall is diffusely thickened. This could be due to under distention or may indicate gastritis. Small  bowel are decompressed. There is wall thickening of the retroperitoneal portion of the duodenal, likely reactive inflammation. Stool-filled colon without abnormal distention. Appendix is not identified. Vascular/Lymphatic: Calcification of the abdominal aorta. No aneurysm. IVC filter. No significant lymphadenopathy. Reproductive: Prostate is unremarkable. Other: Moderate free fluid in the abdomen and pelvis, likely ascites. No free air. Prominent diffuse soft tissue edema in the subcutaneous fat. Musculoskeletal: Degenerative changes in the spine and hips. Areas of lucency and sclerosis in the L4, L5, and S1 vertebrae. This is likely degenerative but could indicate metastatic disease in the appropriate clinical setting. Curvilinear sclerosis in the femoral head suggest avascular necrosis. IMPRESSION: 1. Diffuse enlargement of the pancreas with peripancreatic edema likely representing acute pancreatitis.  Wall thickening of the duodenal is likely reactive. No loculated collections. 2. Diffuse gastric wall thickening may be due to under distention or gastritis. 3. Small bilateral pleural effusions with basilar atelectasis or consolidation, greater on the left. 4. Diffuse bladder wall thickening, possibly under outlet obstruction or cystitis. Correlate with urinalysis. 5. Moderate ascites. 6. Aortic atherosclerosis. 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 Electronically Signed   By: Burman Nieves M.D.   On: 05/22/2023 23:51   DG Chest Port 1 View  Result Date: 05/22/2023 CLINICAL DATA:  Weakness. EXAM: PORTABLE CHEST 1 VIEW COMPARISON:  05/01/2023, chest CT 12/23/2023 FINDINGS: Stable heart size and mediastinal contours. Left basilar opacity is again seen, elevated hemidiaphragm and atelectasis on prior CT. There may be a small left pleural effusion. Streaky right infrahilar atelectasis. No pulmonary edema. No pneumothorax. IMPRESSION: 1. Left basilar opacity is in part related to elevated hemidiaphragm and  atelectasis when compared prior CT. There may be a small left pleural effusion. 2. Streaky right infrahilar atelectasis. Electronically Signed   By: Narda Rutherford M.D.   On: 05/22/2023 16:27    Scheduled Meds:  apixaban  5 mg Oral BID   cefadroxil  1,000 mg Oral BID   Chlorhexidine Gluconate Cloth  6 each Topical Daily   melatonin  3 mg Oral QHS   pantoprazole (PROTONIX) IV  40 mg Intravenous Q24H   Continuous Infusions:  sodium chloride 120 mL/hr at 05/23/23 1215     LOS: 1 day   Critical Care Procedure Note Authorized and Performed by: Maryln Manuel MD  Total Critical Care time:  55 mins  Due to a high probability of clinically significant, life threatening deterioration, the patient required my highest level of preparedness to intervene emergently and I personally spent this critical care time directly and personally managing the patient.  This critical care time included obtaining a history; examining the patient, pulse oximetry; ordering and review of studies; arranging urgent treatment with development of a management plan; evaluation of patient's response of treatment; frequent reassessment; and discussions with other providers.  This critical care time was performed to assess and manage the high probability of imminent and life threatening deterioration that could result in multi-organ failure.  It was exclusive of separately billable procedures and treating other patients and teaching time.   Standley Dakins, MD How to contact the [facility] Attending or Consulting provider 7A - 7P or covering provider during after hours 7P -7A, for this patient?  Check the care team in [facility] and look for a) attending/consulting [facility] provider listed and b) the [facility] team listed Log into [email or web address] to find provider on call.  Locate the [facility] provider you are looking for under [facility] and page to a number that you can be directly reached. If you still have difficulty reaching the provider, please  page the [facility] (Director on Call) for the Hospitalists listed on amion for assistance.  05/23/2023, 12:34 PM

## 2023-05-23 NOTE — Progress Notes (Signed)
Initial Nutrition Assessment  DOCUMENTATION CODES:   Not applicable, suspect malnutrition  INTERVENTION:   Add PO supplements when diet advanced: Ensure Plus High Protein po TID, each supplement provides 350 kcal and 20 grams of protein. MVI with minerals daily.  NUTRITION DIAGNOSIS:   Increased nutrient needs related to wound healing as evidenced by estimated needs.  GOAL:   Patient will meet greater than or equal to 90% of their needs  MONITOR:   Diet advancement, PO intake  REASON FOR ASSESSMENT:   Consult Assessment of nutrition requirement/status  ASSESSMENT:   72 yo male admitted with SIRS, hypotension, abdominal pain. PMH includes DM, HTN, DVT, kidney stones, spontaneous SAH in 2018, sleep apnea.  RD working remotely. Unable to speak with patient or complete NFPE at this time.  Per H&P, patient was eating half a meal twice per day with a decreased appetite PTA. Unsure how long this has been going on.  Currently NPO for CT abd/pelvis.  Per WOC RN note, DTI on L heel has evolved into a stage 3 pressure injury.   Weight history reviewed.   6% weight loss within the past month. 13% weight loss within the past 4 months. 20% weight loss within the past year.  Suspect malnutrition; unable to obtain enough information at this time for identification of malnutrition.   NUTRITION - FOCUSED PHYSICAL EXAM:  Unable to complete  Diet Order:   Diet Order             Diet NPO time specified Except for: Ice Chips, Sips with Meds  Diet effective now                   EDUCATION NEEDS:   No education needs have been identified at this time  Skin:  Skin Assessment: Skin Integrity Issues: Skin Integrity Issues:: Stage III, Unstageable Stage III: L heel Unstageable: vertebral column  Last BM:  10/23  Height:   Ht Readings from Last 1 Encounters:  05/22/23 6' (1.829 m)    Weight:   Wt Readings from Last 1 Encounters:  05/22/23 79.4 kg    Ideal  Body Weight:  80.9 kg  BMI:  Body mass index is 23.73 kg/m.  Estimated Nutritional Needs:   Kcal:  2200-2400  Protein:  100-120 gm  Fluid:  2.2-2.4 L   Gabriel Rainwater RD, LDN, CNSC Please refer to Amion for contact information.

## 2023-05-23 NOTE — TOC Initial Note (Addendum)
Transition of Care [facility]) - Initial/Assessment Note    Patient Details  Name: Jim Ramirez MRN: [id number] Date of Birth: Feb 07, 1951  [facility]) CM/SW Contact:    Leitha Bleak, RN Phone Number: 05/23/2023, 3:41 PM  Clinical Narrative:       Patient admitted with SIRS. Patient lives with Brother and sister. He has a wound. CM called to discuss home health RN for wound care. Patient feels like he needs PT also.  He completed 3 weeks at [facility]. He would also be agreeable to go back if he can afford the co-pay.  PT eval pending.  MD updated. CMS provider options for home health given. [facility] accepts his insurance, He is agreeable. Referral sent to Maralyn Sago, She will follow up with TOC to see if they have staffing. TOC following.       Addendum Revonda Standard called, saying patient is confused, he is from [facility], he has been there since July. They will accept him back.  MD is ordering Palliative patient may need to return with hospice services. Revonda Standard updated.   Cancelled Referral with [facility]. TOC following.    Expected Discharge Plan: Home w Home Health Services Barriers to Discharge: Continued Medical Work up   Patient Goals and CMS Choice Patient states their goals for this hospitalization and ongoing recovery are:: to get better CMS Medicare.gov Compare Post Acute Care list provided to:: Patient Choice offered to / list presented to : Patient     Expected Discharge Plan and Services      Living arrangements for the past 2 months: Single Family Home                      HH Arranged: PT, RN   Date HH Agency Contacted: 05/23/23 Time HH Agency Contacted: 1540 Representative spoke with at [facility] Agency: Versie Starks  Prior Living Arrangements/Services Living arrangements for the past 2 months: Single Family Home Lives with:: Siblings Patient language and need for interpreter reviewed:: Yes        Need for Family Participation in Patient Care: Yes  (Comment) Care giver support system in place?: Yes (comment)   Criminal Activity/Legal Involvement Pertinent to Current Situation/Hospitalization: No - Comment as needed  Activities of Daily Living   ADL Screening (condition at time of admission) Independently performs ADLs?: No Does the patient have a NEW difficulty with bathing/dressing/toileting/self-feeding that is expected to last >3 days?: No Does the patient have a NEW difficulty with getting in/out of bed, walking, or climbing stairs that is expected to last >3 days?: No Does the patient have a NEW difficulty with communication that is expected to last >3 days?: No Is the patient deaf or have difficulty hearing?: No Does the patient have difficulty seeing, even when wearing glasses/contacts?: No Does the patient have difficulty concentrating, remembering, or making decisions?: No  Permission Sought/Granted      Emotional Assessment     Affect (typically observed): Accepting Orientation: : Oriented to Self, Oriented to Place, Oriented to  Time, Oriented to Situation Alcohol / Substance Use: Not Applicable Psych Involvement: No (comment)  Admission diagnosis:  SIRS (systemic inflammatory response syndrome) (HCC) [R65.10] Patient Active Problem List   Diagnosis Date Noted   SIRS (systemic inflammatory response syndrome) (HCC) 05/22/2023   Hypotension 05/22/2023   Pressure injury of heel, stage 3 (HCC) 05/22/2023   Pressure injury of right thoracic region of back, stage 2 (HCC) 02/24/2023   Hypokalemia 02/22/2023  Paroxysmal atrial fibrillation with RVR (HCC) 02/22/2023   Malnutrition of moderate degree 01/14/2023   MSSA bacteremia 12/26/2022   New onset atrial fibrillation (HCC) 12/24/2022   Discitis of lumbar region 12/24/2022   Sepsis (HCC) 12/24/2022   BPH (benign prostatic hyperplasia) 12/24/2022   Mood disorder (HCC) 12/24/2022   Cervical spondylosis with myelopathy and radiculopathy 05/22/2022   Diabetes  mellitus type 2 in nonobese [facility]) 02/26/2008   Dyslipidemia 02/26/2008   Essential hypertension 02/26/2008   Internal hemorrhoids 02/26/2008   External hemorrhoids 02/26/2008   Duodenal ulcer without hemorrhage or perforation and without obstruction 02/26/2008   Gastritis and gastroduodenitis 02/26/2008   Duodenitis 02/26/2008   MELENA 02/26/2008   NECK PAIN 02/26/2008   INSOMNIA 02/26/2008   Sleep apnea 02/26/2008   Abdominal pain 02/26/2008   RECTAL BLEEDING, HX OF 02/26/2008   PCP:  [name], MD Pharmacy:   [facility] [place] - [place], [place] - [address] [place] [address] Phone: [phone] Fax: [phone]  [facility] - [address] [place], [place] - [address] [facility] [address] [place] [address] Phone: [phone] Fax: [phone]   Social Determinants of Health (SDOH) Social History: SDOH Screenings   Food Insecurity: No Food Insecurity (05/22/2023)  Housing: Low Risk  (05/22/2023)  Transportation Needs: No Transportation Needs (05/22/2023)  Utilities: Not At Risk (05/22/2023)  Depression (PHQ2-9): Low Risk  (04/25/2023)  Financial Resource Strain: Low Risk  (05/01/2023)   Received from [facility]  Physical Activity: Inactive (05/01/2023)   Received from [facility]  Social Connections: Socially Isolated (05/01/2023)   Received from [facility]  Stress: No Stress Concern Present (05/01/2023)   Received from [facility]  Tobacco Use: Medium Risk (05/22/2023)  Health Literacy: Medium Risk (05/01/2023)   Received from [facility]   SDOH Interventions:    Readmission Risk Interventions    05/23/2023    3:41 PM  Readmission Risk Prevention Plan  Transportation Screening Complete  PCP or Specialist Appt within 5-7 Days Not Complete  Home Care Screening Complete  Medication Review (RN CM) Complete

## 2023-05-24 ENCOUNTER — Inpatient Hospital Stay (HOSPITAL_COMMUNITY): Payer: Medicare HMO

## 2023-05-24 ENCOUNTER — Encounter (HOSPITAL_COMMUNITY): Payer: Self-pay | Admitting: Family Medicine

## 2023-05-24 DIAGNOSIS — R6510 Systemic inflammatory response syndrome (SIRS) of non-infectious origin without acute organ dysfunction: Secondary | ICD-10-CM | POA: Diagnosis not present

## 2023-05-24 DIAGNOSIS — E876 Hypokalemia: Secondary | ICD-10-CM | POA: Diagnosis not present

## 2023-05-24 DIAGNOSIS — R110 Nausea: Secondary | ICD-10-CM

## 2023-05-24 DIAGNOSIS — I480 Paroxysmal atrial fibrillation: Secondary | ICD-10-CM | POA: Diagnosis not present

## 2023-05-24 DIAGNOSIS — K8500 Idiopathic acute pancreatitis without necrosis or infection: Secondary | ICD-10-CM

## 2023-05-24 DIAGNOSIS — R1031 Right lower quadrant pain: Secondary | ICD-10-CM | POA: Diagnosis not present

## 2023-05-24 LAB — COMPREHENSIVE METABOLIC PANEL
ALT: 12 U/L (ref 0–44)
AST: 15 U/L (ref 15–41)
Albumin: <1.5 g/dL — ABNORMAL LOW (ref 3.5–5.0)
Alkaline Phosphatase: 66 U/L (ref 38–126)
Anion gap: 6 (ref 5–15)
BUN: 7 mg/dL — ABNORMAL LOW (ref 8–23)
CO2: 25 mmol/L (ref 22–32)
Calcium: 7.3 mg/dL — ABNORMAL LOW (ref 8.9–10.3)
Chloride: 108 mmol/L (ref 98–111)
Creatinine, Ser: 0.52 mg/dL — ABNORMAL LOW (ref 0.61–1.24)
GFR, Estimated: >60 mL/min (ref >60)
Glucose, Bld: 193 mg/dL — ABNORMAL HIGH (ref 70–99)
Potassium: 3.1 mmol/L — ABNORMAL LOW (ref 3.5–5.1)
Sodium: 139 mmol/L (ref 135–145)
Total Bilirubin: 0.3 mg/dL (ref 0.3–1.2)
Total Protein: 3.8 g/dL — ABNORMAL LOW (ref 6.5–8.1)

## 2023-05-24 LAB — GLUCOSE, CAPILLARY
Glucose-Capillary: 101 mg/dL — ABNORMAL HIGH (ref 70–99)
Glucose-Capillary: 125 mg/dL — ABNORMAL HIGH (ref 70–99)
Glucose-Capillary: 151 mg/dL — ABNORMAL HIGH (ref 70–99)
Glucose-Capillary: 49 mg/dL — ABNORMAL LOW (ref 70–99)
Glucose-Capillary: 54 mg/dL — ABNORMAL LOW (ref 70–99)
Glucose-Capillary: 66 mg/dL — ABNORMAL LOW (ref 70–99)
Glucose-Capillary: 70 mg/dL (ref 70–99)
Glucose-Capillary: 92 mg/dL (ref 70–99)
Glucose-Capillary: 97 mg/dL (ref 70–99)

## 2023-05-24 LAB — BLOOD GAS, VENOUS
Acid-Base Excess: 2.2 mmol/L — ABNORMAL HIGH (ref 0.0–2.0)
Bicarbonate: 27.8 mmol/L (ref 20.0–28.0)
Drawn by: 27160
O2 Saturation: 96.1 %
Patient temperature: 36.9
pCO2, Ven: 46 mm[Hg] (ref 44–60)
pH, Ven: 7.39 (ref 7.25–7.43)
pO2, Ven: 66 mm[Hg] — ABNORMAL HIGH (ref 32–45)

## 2023-05-24 LAB — LIPASE, BLOOD: Lipase: 70 U/L — ABNORMAL HIGH (ref 11–51)

## 2023-05-24 LAB — MAGNESIUM: Magnesium: 1.7 mg/dL (ref 1.7–2.4)

## 2023-05-24 MED ORDER — DEXTROSE 10 % IV SOLN: INTRAVENOUS | Status: DC

## 2023-05-24 MED ORDER — METOPROLOL TARTRATE 25 MG PO TABS
12.5000 mg | ORAL_TABLET | Freq: Two times a day (BID) | ORAL | Status: DC
  Administered 2023-05-24 – 2023-05-25 (×3): 12.5 mg via ORAL
  Filled 2023-05-24 (×3): qty 1

## 2023-05-24 MED ORDER — DEXTROSE 50 % IV SOLN
1.0000 | INTRAVENOUS | Status: DC | PRN
  Filled 2023-05-24: qty 50

## 2023-05-24 MED ORDER — IPRATROPIUM-ALBUTEROL 0.5-2.5 (3) MG/3ML IN SOLN
3.0000 mL | Freq: Four times a day (QID) | RESPIRATORY_TRACT | Status: DC
  Administered 2023-05-24 – 2023-05-25 (×6): 3 mL via RESPIRATORY_TRACT
  Filled 2023-05-24 (×6): qty 3

## 2023-05-24 MED ORDER — LORAZEPAM 2 MG/ML IJ SOLN
0.2500 mg | INTRAMUSCULAR | Status: DC | PRN
  Administered 2023-05-26 – 2023-05-27 (×2): 0.25 mg via INTRAVENOUS
  Filled 2023-05-24 (×2): qty 1

## 2023-05-24 MED ORDER — POTASSIUM CHLORIDE 10 MEQ/100ML IV SOLN
10.0000 meq | INTRAVENOUS | Status: AC
  Administered 2023-05-24 (×4): 10 meq via INTRAVENOUS
  Filled 2023-05-24 (×4): qty 100

## 2023-05-24 MED ORDER — MAGNESIUM SULFATE 4 GM/100ML IV SOLN
4.0000 g | Freq: Once | INTRAVENOUS | Status: AC
Start: 2023-05-24 — End: 2023-05-24
  Administered 2023-05-24: 4 g via INTRAVENOUS
  Filled 2023-05-24: qty 100

## 2023-05-24 MED ORDER — DEXTROSE 50 % IV SOLN
INTRAVENOUS | Status: AC
  Administered 2023-05-24: 50 mL via INTRAVENOUS
  Filled 2023-05-24: qty 50

## 2023-05-24 MED ORDER — DEXTROSE 50 % IV SOLN
12.5000 g | INTRAVENOUS | Status: AC
  Administered 2023-05-24: 12.5 g via INTRAVENOUS

## 2023-05-24 MED ORDER — DEXTROSE 50 % IV SOLN
INTRAVENOUS | Status: AC
  Administered 2023-05-24: 50 mL
  Filled 2023-05-24: qty 50

## 2023-05-24 MED ORDER — DEXTROSE 50 % IV SOLN: 1.0000 | Freq: Once | INTRAVENOUS | Status: AC

## 2023-05-24 MED ORDER — POTASSIUM CHLORIDE CRYS ER 20 MEQ PO TBCR
40.0000 meq | EXTENDED_RELEASE_TABLET | Freq: Once | ORAL | Status: DC
  Filled 2023-05-24: qty 2

## 2023-05-24 NOTE — Plan of Care (Signed)
  Problem: Education: Goal: Knowledge of General Education information will improve Description: Including pain rating scale, medication(s)/side effects and non-pharmacologic comfort measures Outcome: Progressing   Problem: Clinical Measurements: Goal: Ability to maintain clinical measurements within normal limits will improve Outcome: Progressing Goal: Will remain free from infection Outcome: Progressing Goal: Diagnostic test results will improve Outcome: Progressing Goal: Cardiovascular complication will be avoided Outcome: Progressing   Problem: Activity: Goal: Risk for activity intolerance will decrease Outcome: Progressing   Problem: Coping: Goal: Level of anxiety will decrease Outcome: Progressing   Problem: Elimination: Goal: Will not experience complications related to bowel motility Outcome: Progressing Goal: Will not experience complications related to urinary retention Outcome: Progressing   Problem: Pain Management: Goal: General experience of comfort will improve Outcome: Progressing   Problem: Safety: Goal: Ability to remain free from injury will improve Outcome: Progressing   Problem: Skin Integrity: Goal: Risk for impaired skin integrity will decrease Outcome: Progressing   Problem: Health Behavior/Discharge Planning: Goal: Ability to manage health-related needs will improve Outcome: Not Progressing   Problem: Clinical Measurements: Goal: Respiratory complications will improve Outcome: Not Progressing Note: Patient with period of apnea/ desaturation throughout day/ 2l [place] added to regimen and nighttime bipap   Problem: Nutrition: Goal: Adequate nutrition will be maintained Outcome: Not Progressing Note: Despite multiple attempts today/ pt with poor dietary intake. MD made aware. Pt on D10 and receiving multiple doses of D50 throughout day to maintain CBG

## 2023-05-24 NOTE — Progress Notes (Signed)
Hypoglycemic Event  CBG: 49   Treatment: D50 50 mL (25 gm)  Symptoms: None  Follow-up CBG: Time:0430  CBG Result: 97  Possible Reasons for Event: Inadequate meal intake  Comments/MD notified:Dr. A. Zierle- Alfonse Flavors

## 2023-05-24 NOTE — Plan of Care (Signed)

## 2023-05-24 NOTE — Progress Notes (Signed)
PROGRESS NOTE   Jim Ramirez  YQM:[id number] DOB: 1951-06-23 DOA: 05/22/2023 PCP: [name], MD   Chief Complaint  Patient presents with   Hypotension   Level of care: Stepdown  Brief Admission History:  72 y.o. 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  Patient was seen in the cardiology office prior to arrival where his blood pressure was reportedly 70/40 with dizziness and fatigue.  He was sent to the ER from there.  Patient reports he is actually here for abdominal pain.  He reports that he has abdominal pain that has been intermittent for 2 days.  It is unpredictable with no identified provoking or relieving factors.  He describes it as a crampy pain.  He has taken Pepto, Tylenol, Advil at home with no relief.  He reports whenever he stops his PPI he has "trouble."  He reports he was recently taken off of his Nexium for unknown reasons.  He was given Bentyl at admission that did not help.  Morphine seems to have helped.  He denies nausea, vomiting, diarrhea.  He reports constipation but then reports his last bowel movement was normal and it was 1 or 2 days ago.  He has had no fever.  As far as his hypotension goes he complains of dizziness and fatigue.  He reports he comes here from [facility] rehab where he is mostly nonambulatory.  He reports the dizziness is when he sits up.  He denies chest pain, but reports palpitations.  The palpitations have been present for 2 weeks.  He also cannot identify anything that provokes those.  He reports a decreased appetite.  He eats about half a meal twice per day.  He has decreased liquid p.o. intake as well.  On  review of systems patient does report he has had 2-3 months of peripheral edema.  Patient has no other complaints at this time.   He does not smoke and does not drink.  Patient reports he would want to be full code.  ACP documents were reviewed which specify a decision maker but no limitations.   Assessment and Plan:  Acute Pancreatitis  - continue supportive measures with IV fluid and pain management - advanced to full liquids - lipase trending dodwn  - he remains in stepdown ICU due to critical illness and frailty - he appears generally unwell and prognosis guarded - requested for goals of care discussion with palliative care team, also has very low albumin which is a poor prognostic indicator in addition to recurrent severe hypoglycemia from poor liver glycogen reserve  SIRS (systemic inflammatory response syndrome) - Patient sent in for hypotension, he also had a temperature of 93.7 - No leukocytosis - Initial lactic acid 2.0 but then 1.1 after a fluid bolus - Chest x-ray shows some atelectasis but no definite infection - Blood cultures pending - UA is not indicative of UTI - Patient is under the treatment of infectious disease and currently getting Duricef - Continue Duricef, which is planned to be continued for at least 10 months following hospitalization for "lumbar spine discitis and osteomyelitis and epidural abscess with stenosis and staph aureus/MSSA bacteremia, with negative TEE , S/P   left L3-4, L4-5 and L5-S1 laminotomy/foraminotomies for drainage of  epidural abscess and decompression of the thecal sac by Dr. Lovell Sheehan on 01/20/2023 "  Pressure injury of heel, stage 3 Present at admission - Following with wound care outpatient - Wound consult recommendations ordered   Hypotension - Likely dehydration contributing - Also manual pressure is reading better than the monitor pressure so patient's blood pressures currently systolic 110s which is still soft but not in the 70s like  the pressure is reading on the monitor - Holding antihypertensives - Continue IV fluids - Continue to monitor  Paroxysmal atrial fibrillation with RVR  - Continue Eliquis for full anticoagulation - Holding metoprolol for bradycardia and hypotension  Hypokalemia - Replace and recheck - Likely secondary to poor p.o. 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 Lovell Sheehan on 01/20/2023 "   Goals of care -- long conversation with patient and with his sister about poor prognosis and need for him to reconsider full code status as he likely would suffer more injury and painful suffering due to his severe deconditioned state    DVT prophylaxis: apixaban  Code Status: Full  Family Communication: long conversation with sister 10/26 discussing goals of care and poor prognosis, she verbalized understanding;  Disposition: TBD  Consultants:  GI   Procedures:   Antimicrobials:    Subjective: Pt still having intermittent severe hypoglycemia, taking sips of full liquids only; abdominal pain improving.   Objective: Vitals:   05/24/23 1200 05/24/23 1203 05/24/23 1300 05/24/23 1514  BP: 135/62  127/61   Pulse: 83  82   Resp: 13  10   Temp:  97.9 F (36.6 C)    TempSrc:  Axillary    SpO2: 100%  100% 100%  Weight:      Height:        Intake/Output Summary (Last 24 hours) at 05/24/2023 1622 Last data filed at 05/24/2023 4098 Gross per 24 hour  Intake 1748.42 ml  Output 1800 ml  Net -51.58 ml   Filed Weights   05/22/23 1429 05/24/23 0420  Weight:  79.4 kg 79.6 kg   Examination:  General exam: cachectic, awake, pale, frail appearing, chronically ill appearing male; Appears Uncomfortable, and he appears terminally ill;  Respiratory system: mild increased work of breathing.  Cardiovascular system: normal S1 & S2 heard. No JVD, murmurs, rubs, gallops or clicks. 1+ pedal edema. Gastrointestinal system: Abdomen is diffusely tender. No organomegaly or masses felt. Normal bowel sounds heard. Central nervous system: Alert and oriented. No focal neurological deficits. Extremities: Symmetric 5 x 5 power. Skin: No rashes, lesions or ulcers. Psychiatry: Judgement and insight UTD. Mood & affect anxious.   Data Reviewed: I have personally reviewed following labs and imaging studies  CBC: Recent Labs  Lab 05/22/23 1440 05/23/23 0447  WBC 6.3 5.4  NEUTROABS 4.4 3.3  HGB 10.1* 8.7*  HCT 31.8* 27.4*  MCV 94.6 94.5  PLT 203 172    Basic Metabolic Panel: Recent Labs  Lab 05/22/23 1440 05/23/23 0447 05/24/23 0416  NA 140 141 139  K 3.3* 3.2* 3.1*  CL 104 108 108  CO2 30 28 25   GLUCOSE 137* 58* 193*  BUN 9  9 7*  CREATININE 0.73 0.66 0.52*  CALCIUM 8.0* 7.6* 7.3*  MG  --  1.9 1.7    CBG: Recent Labs  Lab 05/24/23 0445 05/24/23 0744 05/24/23 0820 05/24/23 1153 05/24/23 1615  GLUCAP 97 54* 151* 70 66*    Recent Results (from the past 240 hour(s))  Culture, blood (routine x 2)     Status: None (Preliminary result)   Collection Time: 05/22/23  2:45 PM   Specimen: BLOOD  Result Value Ref Range Status   Specimen Description BLOOD RIGHT ASSIST CONTROL  Final   Special Requests   Final    BOTTLES DRAWN AEROBIC AND ANAEROBIC Blood Culture adequate volume   Culture   Final    NO GROWTH 2 DAYS Performed at [facility], [address] [place], [place] [address]    Report Status PENDING  Incomplete  Culture, blood (routine x 2)     Status: None (Preliminary result)   Collection Time: 05/22/23  3:18 PM   Specimen: BLOOD   Result Value Ref Range Status   Specimen Description BLOOD BLOOD LEFT HAND AEROBIC BOTTLE ONLY  Final   Special Requests   Final    BOTTLES DRAWN AEROBIC ONLY Blood Culture adequate volume   Culture   Final    NO GROWTH 2 DAYS Performed at [facility], [address] [place], [place] [address]    Report Status PENDING  Incomplete  MRSA Next Gen by PCR, Nasal     Status: None   Collection Time: 05/22/23 10:10 PM   Specimen: Nasal Mucosa; Nasal Swab  Result Value Ref Range Status   MRSA by PCR Next Gen NOT DETECTED NOT DETECTED Final    Comment: (NOTE) The GeneXpert MRSA Assay (FDA approved for NASAL specimens only), is one component of a comprehensive MRSA colonization surveillance program. It is not intended to diagnose MRSA infection nor to guide or monitor treatment for MRSA infections. Test performance is not FDA approved in patients less than 32 years old. Performed at [facility], [address] [place], [place] [address]      Radiology Studies: DG CHEST PORT 1 VIEW  Result Date: 05/24/2023 CLINICAL DATA:  Shortness of breath EXAM: PORTABLE CHEST - 1 VIEW COMPARISON:  05/22/2023 FINDINGS: Persistent elevation of the left diaphragmatic leaflet. Worsening consolidation/atelectasis at the left lung base with suspected effusion. Right lung clear. Heart size and mediastinal contours are within normal limits. Cervical spine fixation hardware. IMPRESSION: Worsening left basilar consolidation/atelectasis with suspected effusion. Electronically Signed   By: Corlis Leak M.D.   On: 05/24/2023 12:37   US Abdomen Limited RUQ (LIVER/GB)  Result Date: 05/23/2023 CLINICAL DATA:  Acute pancreatitis. EXAM: ULTRASOUND ABDOMEN LIMITED RIGHT UPPER QUADRANT COMPARISON:  CT 05/22/2023 FINDINGS: Gallbladder: No gallstones or wall thickening visualized. No sonographic Murphy sign noted by sonographer. Common bile duct: Diameter: 4 mm Liver: No focal lesion identified. Within normal limits in  parenchymal echogenicity. Portal vein is patent on color Doppler imaging with normal direction of blood flow towards the liver. Other: Scattered ascites.  Right-sided pleural effusion. IMPRESSION: No gallstones or ductal dilatation. Ascites. Right pleural effusion. Electronically Signed   By: Karen Kays M.D.   On: 05/23/2023 14:11   CT ABDOMEN PELVIS WO CONTRAST  Result Date: 05/22/2023 CLINICAL DATA:  Acute nonlocalized abdominal pain. EXAM: CT ABDOMEN AND PELVIS WITHOUT CONTRAST TECHNIQUE: Multidetector CT imaging of the abdomen and pelvis was performed following the standard protocol without IV contrast. RADIATION DOSE REDUCTION: This exam was performed according  to the departmental dose-optimization program which includes automated exposure control, adjustment of the mA and/or kV according to patient size and/or use of iterative reconstruction technique. COMPARISON:  CT chest abdomen and pelvis 12/22/2022 FINDINGS: Lower chest: Small bilateral pleural effusions with basilar atelectasis or consolidation, greater on the left. This could represent compressive atelectasis or pneumonia. Elevation of the left hemidiaphragm. Hepatobiliary: No focal liver abnormality is seen. No gallstones, gallbladder wall thickening, or biliary dilatation. Pancreas: The pancreas is diffusely enlarged with edema around the pancreas and throughout the upper abdomen. Appearances are likely to represent acute pancreatitis. No loculated collections are identified. Spleen: Normal in size without focal abnormality. Adrenals/Urinary Tract: No adrenal gland nodules. Kidneys are symmetrical. Bilateral intrarenal stones. Largest on the left measures 4 mm diameter. No hydronephrosis or hydroureter. Bladder wall is diffusely thickened, likely cystitis or outlet obstruction. Correlate with urinalysis. Stomach/Bowel: Stomach is decompressed. The gastric wall is diffusely thickened. This could be due to under distention or may indicate  gastritis. Small bowel are decompressed. There is wall thickening of the retroperitoneal portion of the duodenal, likely reactive inflammation. Stool-filled colon without abnormal distention. Appendix is not identified. Vascular/Lymphatic: Calcification of the abdominal aorta. No aneurysm. IVC filter. No significant lymphadenopathy. Reproductive: Prostate is unremarkable. Other: Moderate free fluid in the abdomen and pelvis, likely ascites. No free air. Prominent diffuse soft tissue edema in the subcutaneous fat. Musculoskeletal: Degenerative changes in the spine and hips. Areas of lucency and sclerosis in the L4, L5, and S1 vertebrae. This is likely degenerative but could indicate metastatic disease in the appropriate clinical setting. Curvilinear sclerosis in the femoral head suggest avascular necrosis. IMPRESSION: 1. Diffuse enlargement of the pancreas with peripancreatic edema likely representing acute pancreatitis. Wall thickening of the duodenal is likely reactive. No loculated collections. 2. Diffuse gastric wall thickening may be due to under distention or gastritis. 3. Small bilateral pleural effusions with basilar atelectasis or consolidation, greater on the left. 4. Diffuse bladder wall thickening, possibly under outlet obstruction or cystitis. Correlate with urinalysis. 5. Moderate ascites. 6. Aortic atherosclerosis. 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 Electronically Signed   By: Burman Nieves M.D.   On: 05/22/2023 23:51    Scheduled Meds:  apixaban  5 mg Oral BID   cefadroxil  1,000 mg Oral BID   Chlorhexidine Gluconate Cloth  6 each Topical Daily   ipratropium-albuterol  3 mL Nebulization Q6H   melatonin  3 mg Oral QHS   metoprolol tartrate  12.5 mg Oral BID   pantoprazole (PROTONIX) IV  40 mg Intravenous Q24H   potassium chloride  40 mEq Oral Once   Continuous Infusions:  dextrose 75 mL/hr at 05/24/23 1330     LOS: 2 days   Critical Care Procedure Note Authorized and Performed by:  Maryln Manuel MD  Total Critical Care time:  58 mins  Due to a high probability of clinically significant, life threatening deterioration, the patient required my highest level of preparedness to intervene emergently and I personally spent this critical care time directly and personally managing the patient.  This critical care time included obtaining a history; examining the patient, pulse oximetry; ordering and review of studies; arranging urgent treatment with development of a management plan; evaluation of patient's response of treatment; frequent reassessment; and discussions with other providers.  This critical care time was performed to assess and manage the high probability of imminent and life threatening deterioration that could result in multi-organ failure.  It was exclusive of separately billable procedures and  treating other patients and teaching time.   Standley Dakins, MD How to contact the [facility] Attending or Consulting provider 7A - 7P or covering provider during after hours 7P -7A, for this patient?  Check the care team in [facility] and look for a) attending/consulting [facility] provider listed and b) the [facility] team listed Log into [email or web address] to find provider on call.  Locate the [facility] provider you are looking for under [facility] and page to a number that you can be directly reached. If you still have difficulty reaching the provider, please page the [facility] (Director on Call) for the Hospitalists listed on amion for assistance.  05/24/2023, 4:22 PM

## 2023-05-25 DIAGNOSIS — E861 Hypovolemia: Secondary | ICD-10-CM | POA: Diagnosis not present

## 2023-05-25 DIAGNOSIS — E162 Hypoglycemia, unspecified: Secondary | ICD-10-CM | POA: Diagnosis not present

## 2023-05-25 DIAGNOSIS — I480 Paroxysmal atrial fibrillation: Secondary | ICD-10-CM | POA: Diagnosis not present

## 2023-05-25 DIAGNOSIS — E876 Hypokalemia: Secondary | ICD-10-CM | POA: Diagnosis not present

## 2023-05-25 DIAGNOSIS — K859 Acute pancreatitis without necrosis or infection, unspecified: Secondary | ICD-10-CM | POA: Diagnosis not present

## 2023-05-25 DIAGNOSIS — E8809 Other disorders of plasma-protein metabolism, not elsewhere classified: Secondary | ICD-10-CM

## 2023-05-25 DIAGNOSIS — K8500 Idiopathic acute pancreatitis without necrosis or infection: Secondary | ICD-10-CM

## 2023-05-25 DIAGNOSIS — R6510 Systemic inflammatory response syndrome (SIRS) of non-infectious origin without acute organ dysfunction: Secondary | ICD-10-CM | POA: Diagnosis not present

## 2023-05-25 LAB — CBC
HCT: 25.6 % — ABNORMAL LOW (ref 39.0–52.0)
Hemoglobin: 8.1 g/dL — ABNORMAL LOW (ref 13.0–17.0)
MCH: 30.2 pg (ref 26.0–34.0)
MCHC: 31.6 g/dL (ref 30.0–36.0)
MCV: 95.5 fL (ref 80.0–100.0)
Platelets: 180 10*3/uL (ref 150–400)
RBC: 2.68 MIL/uL — ABNORMAL LOW (ref 4.22–5.81)
RDW: 16.2 % — ABNORMAL HIGH (ref 11.5–15.5)
WBC: 5.3 10*3/uL (ref 4.0–10.5)
nRBC: 0 % (ref 0.0–0.2)

## 2023-05-25 LAB — GLUCOSE, CAPILLARY
Glucose-Capillary: 128 mg/dL — ABNORMAL HIGH (ref 70–99)
Glucose-Capillary: 111 mg/dL — ABNORMAL HIGH (ref 70–99)
Glucose-Capillary: 91 mg/dL (ref 70–99)
Glucose-Capillary: 140 mg/dL — ABNORMAL HIGH (ref 70–99)
Glucose-Capillary: 197 mg/dL — ABNORMAL HIGH (ref 70–99)
Glucose-Capillary: 160 mg/dL — ABNORMAL HIGH (ref 70–99)

## 2023-05-25 LAB — COMPREHENSIVE METABOLIC PANEL
ALT: 14 U/L (ref 0–44)
AST: 15 U/L (ref 15–41)
Albumin: <1.5 g/dL — ABNORMAL LOW (ref 3.5–5.0)
Alkaline Phosphatase: 68 U/L (ref 38–126)
Anion gap: 4 — ABNORMAL LOW (ref 5–15)
BUN: 5 mg/dL — ABNORMAL LOW (ref 8–23)
CO2: 28 mmol/L (ref 22–32)
Calcium: 7.3 mg/dL — ABNORMAL LOW (ref 8.9–10.3)
Chloride: 108 mmol/L (ref 98–111)
Creatinine, Ser: 0.41 mg/dL — ABNORMAL LOW (ref 0.61–1.24)
GFR, Estimated: >60 mL/min (ref >60)
Glucose, Bld: 118 mg/dL — ABNORMAL HIGH (ref 70–99)
Potassium: 3.4 mmol/L — ABNORMAL LOW (ref 3.5–5.1)
Sodium: 140 mmol/L (ref 135–145)
Total Bilirubin: 0.3 mg/dL (ref 0.3–1.2)
Total Protein: 3.7 g/dL — ABNORMAL LOW (ref 6.5–8.1)

## 2023-05-25 LAB — MAGNESIUM: Magnesium: 2.2 mg/dL (ref 1.7–2.4)

## 2023-05-25 MED ORDER — METOPROLOL TARTRATE 25 MG PO TABS
25.0000 mg | ORAL_TABLET | Freq: Two times a day (BID) | ORAL | Status: DC
  Administered 2023-05-25 – 2023-05-29 (×6): 25 mg via ORAL
  Filled 2023-05-25 (×7): qty 1

## 2023-05-25 MED ORDER — ENSURE ENLIVE PO LIQD
237.0000 mL | Freq: Three times a day (TID) | ORAL | Status: DC
  Administered 2023-05-25 – 2023-05-28 (×9): 237 mL via ORAL

## 2023-05-25 MED ORDER — POTASSIUM CHLORIDE 10 MEQ/100ML IV SOLN
10.0000 meq | INTRAVENOUS | Status: AC
  Administered 2023-05-25 (×3): 10 meq via INTRAVENOUS
  Filled 2023-05-25 (×3): qty 100

## 2023-05-25 MED ORDER — IPRATROPIUM-ALBUTEROL 0.5-2.5 (3) MG/3ML IN SOLN
3.0000 mL | Freq: Three times a day (TID) | RESPIRATORY_TRACT | Status: DC
  Administered 2023-05-26 – 2023-05-28 (×9): 3 mL via RESPIRATORY_TRACT
  Filled 2023-05-25 (×9): qty 3

## 2023-05-25 NOTE — Progress Notes (Signed)
Pt. Took CPAP mask off and did not want to have it placed back on. Connected pt. back to nasal cannula 2L, O2 sat 100%. RT notified.  05/24/23 2355  Vitals  Pulse Rate 94  ECG Heart Rate 94  Resp 19  Oxygen Therapy  SpO2 100 %  O2 Device Nasal Cannula (pt. took CPAP off)  O2 Flow Rate (L/min) 2 L/min  MEWS Score  MEWS Temp 0  MEWS Systolic 0  MEWS Pulse 0  MEWS RR 0  MEWS LOC 1  MEWS Score 1  MEWS Score Color [place] Si

## 2023-05-25 NOTE — Plan of Care (Signed)

## 2023-05-25 NOTE — Evaluation (Signed)
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   CTA negative for aneurysm   Depressed    Diabetes mellitus without complication (HCC)    DVT (deep venous thrombosis) (HCC)    History of kidney stones    Hypertension    Nephrolithiasis    Sleep apnea    Past Surgical History:  Past Surgical History:  Procedure Laterality Date   ANTERIOR CERVICAL DECOMP/DISCECTOMY FUSION N/A 05/22/2022   Procedure: ANTERIOR CERVICAL DISCECTOMY FUSION, INTERBODY PROSTHESIS,PLATE/SCREWS CERVICAL THREE-FOUR, CERVICAL FOUR-FIVE;REMOVAL CERVICAL PLATE;  Surgeon: Tressie Stalker, MD;  Location: [facility] OR;  Service: Neurosurgery;  Laterality: N/A;  3C   CATARACT EXTRACTION W/PHACO Left 09/23/2022   Procedure: CATARACT EXTRACTION PHACO AND INTRAOCULAR LENS PLACEMENT (IOC);  Surgeon: Fabio Pierce, MD;  Location: [facility];  Service: Ophthalmology;  Laterality: Left;  CDE: 7.52   CATARACT EXTRACTION W/PHACO Right 10/07/2022   Procedure: CATARACT EXTRACTION PHACO AND INTRAOCULAR LENS PLACEMENT (IOC);  Surgeon: Fabio Pierce, MD;  Location: [facility];  Service: Ophthalmology;  Laterality: Right;  CDE: 9.40   IR IVC FILTER PLMT / S&I /IMG GUID/MOD SED  01/19/2023   LUMBAR LAMINECTOMY/DECOMPRESSION MICRODISCECTOMY N/A 01/20/2023   Procedure: LUMBAR LAM FOR EPIDURAL ABSCESS, LUMBAR THREE-FOUR, LUMBAR FOUR-FIVE,  LUMBAR FIVE-SACRAL ONE;  Surgeon: Tressie Stalker, MD;  Location: [facility] OR;  Service: Neurosurgery;  Laterality: N/A;   STOMACH SURGERY     TEE WITHOUT CARDIOVERSION N/A 12/31/2022   Procedure: TRANSESOPHAGEAL ECHOCARDIOGRAM;  Surgeon: Little Ishikawa, MD;  Location:  [facility] INVASIVE CV LAB;  Service: Cardiovascular;  Laterality: N/A;   TONSILLECTOMY  2000   HPI:  72 y.o. 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  Patient was seen in the cardiology office prior to arrival where his blood pressure was reportedly 70/40 with dizziness and fatigue.  He was sent to the ER from there.  Patient reports he is actually here for abdominal pain.  He reports that he has abdominal pain that has been intermittent for 2 days.  It is unpredictable with no identified provoking or relieving factors.  He describes it as a crampy pain.  He has taken Pepto, Tylenol, Advil at home with no relief.  He reports whenever he stops his PPI he has "trouble."  He reports he was recently taken off of his Nexium for unknown reasons.  He was given Bentyl at admission that did not help.  Morphine seems to have helped.  He denies nausea, vomiting, diarrhea.  He reports constipation but then reports his last bowel movement was normal and it was 1 or 2 days ago.  He has had no fever.  As far as his hypotension goes he complains of dizziness and fatigue.  He reports he comes here from [facility] rehab where he is mostly nonambulatory.  He reports the dizziness is when he sits up.  He denies chest pain, but reports palpitations.  The palpitations have been present for 2 weeks.  He also cannot identify anything that provokes those.  He reports a decreased appetite.  He eats about half a meal twice per day.  He has decreased liquid p.o. intake as well.  On review of systems patient does report he has had 2-3 months of peripheral edema.  Patient has no other complaints at this time.     Assessment / Plan / Recommendation  Clinical Impression  Clinical swallowing evaluation completed while Pt was sitting upright in bed. Pt is on a full liquid diet and was assessed with thin liquid. Pt denies dysphagia however, RN reports night shift reported coughing episodes after liquids. Pt consumed thin liquids via straw and cup without overt s/sx of aspiration. SLP reviewed universal aspiration precautions and educated Pt that he should always be sitting upright for PO. Pt reports having "swallow tests" in the past that "didn't show nothing". Defer to GI for diet recommendation but from an oropharyngeal standpoint recommend regular/thin diet when medically appropriate to progress diet. There are no further ST needs indicated at this time. Please reconsult if needed, thank you, SLP Visit Diagnosis: Dysphagia, unspecified (R13.10)       Diet Recommendation Regular;Thin liquid    Liquid Administration via: Cup;Straw Medication Administration: Whole meds with liquid Supervision: Patient able to self feed Compensations: Minimize environmental distractions;Slow rate;Small sips/bites Postural Changes: Seated upright at 90 degrees    Other  Recommendations Oral Care Recommendations: Oral care BID    Recommendations for follow up therapy are one component of a multi-disciplinary discharge planning process, led by the attending physician.  Recommendations may be updated based on patient status, additional functional criteria and insurance authorization.  Follow up Recommendations No SLP follow up         Functional Status Assessment Patient has had a recent decline in their functional status and demonstrates the ability to make significant improvements in function in a reasonable and predictable amount of time.    Swallow Study   General Date of Onset: 05/22/23 HPI: 72 y.o. 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  Patient was seen in the cardiology office prior to arrival where his blood pressure was reportedly 70/40 with dizziness and fatigue.  He was sent to the ER from there.  Patient reports he is actually here for abdominal pain.  He reports that he has abdominal pain that has been intermittent for 2 days.  It is unpredictable with no identified provoking or relieving factors.  He describes it as a crampy pain.  He has taken Pepto, Tylenol, Advil at home with no relief.  He reports whenever he stops his PPI he has "trouble."  He reports he was recently taken off of his Nexium for unknown reasons.  He was given Bentyl at admission that did not help.  Morphine seems to have helped.  He denies nausea, vomiting, diarrhea.  He reports constipation but then reports his last bowel movement was normal and it was 1 or 2 days ago.  He has had no fever.  As far as his hypotension goes he complains of dizziness and fatigue.  He reports he comes here from [facility] rehab where he is mostly nonambulatory.  He reports the dizziness is when he sits up.  He  denies chest pain, but reports palpitations.  The palpitations have been present for 2 weeks.  He also cannot identify anything that provokes those.  He reports a decreased appetite.  He eats about half a meal twice per day.  He has decreased liquid p.o. intake as well.  On review of systems patient does report he has had 2-3 months of peripheral edema.  Patient has no other complaints at this time. Type of Study: Bedside Swallow Evaluation Previous Swallow Assessment: none in chart Diet Prior to this Study: Full liquid diet Temperature Spikes Noted: No Respiratory Status: Room air History of Recent Intubation:  No Behavior/Cognition: Alert;Pleasant mood;Cooperative Oral Cavity Assessment: Within Functional Limits Oral Care Completed by SLP: Yes Oral Cavity - Dentition: Edentulous Vision: Functional for self-feeding Self-Feeding Abilities: Able to feed self Patient Positioning: Upright in bed Baseline Vocal Quality: Normal Volitional Cough: Strong Volitional Swallow: Able to elicit    Oral/Motor/Sensory Function Overall Oral Motor/Sensory Function: Within functional limits   Ice Chips Ice chips: Within functional limits   Thin Liquid Thin Liquid: Within functional limits    Nectar Thick Nectar Thick Liquid: Not tested   Honey Thick Honey Thick Liquid: Not tested   Puree Puree: Not tested   Solid     Solid: Not tested     Marquette Piontek H. Romie Levee, CCC-SLP Speech Language Pathologist  Georgetta Haber 05/25/2023,4:07 PM

## 2023-05-25 NOTE — Progress Notes (Signed)
Patient placed on full face CPAP and tolerating well at this time.

## 2023-05-25 NOTE — Progress Notes (Signed)
Subjective: Patient seen and examined today.  Notes improvement in his abdominal pain.  No nausea or vomiting.  Objective: Vital signs in last 24 hours: Temp:  [97.4 F (36.3 C)-98.3 F (36.8 C)] 98.1 F (36.7 C) (10/27 0812) Pulse Rate:  [66-99] 87 (10/27 0915) Resp:  [8-21] 16 (10/27 0600) BP: (113-155)/(49-100) 148/69 (10/27 0915) SpO2:  [94 %-100 %] 97 % (10/27 0853) Last BM Date : 05/24/23 General:   Alert and oriented, pleasant Head:  Normocephalic and atraumatic. Eyes:  No icterus, sclera clear. Conjuctiva pink.  Abdomen:  Bowel sounds present, soft, non-tender, non-distended. No HSM or hernias noted. No rebound or guarding. No masses appreciated  Msk:  Symmetrical without gross deformities. Normal posture. Extremities:  Without clubbing or edema. Neurologic:  Alert and  oriented x4;  grossly normal neurologically. Skin:  Warm and dry, intact without significant lesions.  Cervical Nodes:  No significant cervical adenopathy. Psych:  Alert and cooperative. Normal mood and affect.  Intake/Output from previous day: 10/26 0701 - 10/27 0700 In: 1593.8 [P.O.:354; I.V.:843.6; IV Piggyback:396.2] Out: 4000 [Urine:4000] Intake/Output this shift: Total I/O In: -  Out: 675 [Urine:675]  Lab Results: Recent Labs    05/22/23 1440 05/23/23 0447 05/25/23 0348  WBC 6.3 5.4 5.3  HGB 10.1* 8.7* 8.1*  HCT 31.8* 27.4* 25.6*  PLT 203 172 180   BMET Recent Labs    05/23/23 0447 05/24/23 0416 05/25/23 0348  NA 141 139 140  K 3.2* 3.1* 3.4*  CL 108 108 108  CO2 28 25 28   GLUCOSE 58* 193* 118*  BUN 9 7* 5*  CREATININE 0.66 0.52* 0.41*  CALCIUM 7.6* 7.3* 7.3*   LFT Recent Labs    05/23/23 0447 05/24/23 0416 05/25/23 0348  PROT 3.9* 3.8* 3.7*  ALBUMIN <1.5* <1.5* <1.5*  AST 12* 15 15  ALT 14 12 14   ALKPHOS 70 66 68  BILITOT 0.3 0.3 0.3   PT/INR No results for input(s): "LABPROT", "INR" in the last 72 hours. Hepatitis Panel No results for input(s): "HEPBSAG",  "HCVAB", "HEPAIGM", "HEPBIGM" in the last 72 hours.   Studies/Results: DG CHEST PORT 1 VIEW  Result Date: 05/24/2023 CLINICAL DATA:  Shortness of breath EXAM: PORTABLE CHEST - 1 VIEW COMPARISON:  05/22/2023 FINDINGS: Persistent elevation of the left diaphragmatic leaflet. Worsening consolidation/atelectasis at the left lung base with suspected effusion. Right lung clear. Heart size and mediastinal contours are within normal limits. Cervical spine fixation hardware. IMPRESSION: Worsening left basilar consolidation/atelectasis with suspected effusion. Electronically Signed   By: Corlis Leak M.D.   On: 05/24/2023 12:37    Assessment: *Acute pancreatitis-idiopathic *Abdominal pain-improved *Hypoalbuminemia *Hypokalemia *Hypoglycemia  Plan: Patient denies any previous history of pancreatitis, no alcohol use or tobacco use.  Triglycerides WNL.  No hypercalcemia.  Gallbladder in situ though LFTs WNL.  Right upper quadrant ultrasound without evidence of cholelithiasis.   IgG4 pending   Likely idiopathic pancreatitis though will need further workup to rule out underlying pancreatic malignancy.  Will tentatively plan on CT pancreas protocol in 4 weeks.  Patient unable to pursue MRI/MRCP given metal in his body.   Continue supportive care with IV fluids, analgesia as needed.  Gastritis/duodenitis likely reactive in the setting of pancreatitis.  Hold off on EGD for now.  Advance diet as tolerated.  Agree with palliative care involvement.  Hennie Duos. Marletta Lor, D.O. Gastroenterology and Hepatology [facility]   LOS: 3 days    05/25/2023, 11:45 AM

## 2023-05-25 NOTE — Progress Notes (Addendum)
PROGRESS NOTE   Jim Ramirez  NGE:[id number] DOB: 01-Aug-1950 DOA: 05/22/2023 PCP: [name], MD   Chief Complaint  Patient presents with   Hypotension   Level of care: Stepdown  Brief Admission History:  72 y.o. 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  Patient was seen in the cardiology office prior to arrival where his blood pressure was reportedly 70/40 with dizziness and fatigue.  He was sent to the ER from there.  Patient reports he is actually here for abdominal pain.  He reports that he has abdominal pain that has been intermittent for 2 days.  It is unpredictable with no identified provoking or relieving factors.  He describes it as a crampy pain.  He has taken Pepto, Tylenol, Advil at home with no relief.  He reports whenever he stops his PPI he has "trouble."  He reports he was recently taken off of his Nexium for unknown reasons.  He was given Bentyl at admission that did not help.  Morphine seems to have helped.  He denies nausea, vomiting, diarrhea.  He reports constipation but then reports his last bowel movement was normal and it was 1 or 2 days ago.  He has had no fever.  As far as his hypotension goes he complains of dizziness and fatigue.  He reports he comes here from [facility] rehab where he is mostly nonambulatory.  He reports the dizziness is when he sits up.  He denies chest pain, but reports palpitations.  The palpitations have been present for 2 weeks.  He also cannot identify anything that provokes those.  He reports a decreased appetite.  He eats about half a meal twice per day.  He has decreased liquid p.o. intake as well.  On  review of systems patient does report he has had 2-3 months of peripheral edema.  Patient has no other complaints at this time.   He does not smoke and does not drink.  Patient reports he would want to be full code.  ACP documents were reviewed which specify a decision maker but no limitations.   Assessment and Plan:  Acute Pancreatitis  - continue supportive measures with IV fluid and pain management - advanced to full liquids - lipase trending down, he is clinically improving with no abdominal pain today - he remains in stepdown ICU due to critical illness and frailty - he appears generally unwell and prognosis guarded - requested for goals of care discussion with palliative care team, also has very low albumin which is a poor prognostic indicator in addition to recurrent severe hypoglycemia from poor liver glycogen reserve  SIRS (systemic inflammatory response syndrome) - resolved  - Patient sent in for hypotension, he also had a temperature of 93.7 - No leukocytosis - Initial lactic acid 2.0 but then 1.1 after a fluid bolus - Chest x-ray shows some atelectasis but no definite infection - Blood cultures pending - UA is not indicative of UTI - Patient is under the treatment of infectious disease and currently getting Duricef - Continue Duricef, which is planned to be continued for at least 10 months following hospitalization for "lumbar spine discitis and osteomyelitis and epidural abscess with stenosis and staph aureus/MSSA bacteremia, with negative TEE , S/P  left L3-4, L4-5 and L5-S1 laminotomy/foraminotomies for drainage of epidural abscess and decompression of the thecal sac by Dr. 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 Lovell Sheehan on 01/20/2023 "   Goals of care -- long conversation with patient and with his sister about poor prognosis and need for him to reconsider full code status as he likely would suffer more injury and painful suffering due to his severe deconditioned state   DVT prophylaxis: apixaban  Code Status: Full  Family Communication: long conversation with sister 10/26 discussing goals of care and poor prognosis, she verbalized understanding; sister planning in person visit today after church Disposition: TBD  Consultants:  GI   Procedures:   Antimicrobials:    Subjective: Pt could only tolerate CPAP for very short period of time, reports abdominal pain better today.    Objective: Vitals:   05/25/23 0600 05/25/23 0812 05/25/23 0853 05/25/23 0915  BP: 122/69   (!) 148/69  Pulse: 66   87   Resp: 16     Temp:  98.1 F (36.7 C)    TempSrc:  Oral    SpO2: 94%  97%   Weight:      Height:        Intake/Output Summary (Last 24 hours) at 05/25/2023 1045 Last data filed at 05/25/2023 1026 Gross per 24 hour  Intake 1593.77 ml  Output 3825 ml  Net -2231.23 ml   Filed Weights   05/22/23 1429 05/24/23 0420  Weight: 79.4 kg 79.6 kg   Examination:  General exam: cachectic, awake, pale, frail appearing, chronically ill appearing male; Appears Uncomfortable, and he appears very ill;  Respiratory system: no increased work of breathing.  Cardiovascular system: normal S1 & S2 heard. No JVD, murmurs, rubs, gallops or clicks. 1+ pedal edema. Gastrointestinal system: Abdomen is soft and nontender today. No organomegaly or masses felt. Normal bowel sounds heard. Central nervous system: Alert and oriented. No focal neurological deficits. Extremities: Symmetric 5 x 5 power. Skin: No rashes, lesions or ulcers. Psychiatry: Judgement and insight UTD. Mood & affect anxious.   Data Reviewed: I have personally reviewed following labs and imaging studies  CBC: Recent Labs  Lab 05/22/23 1440 05/23/23 0447 05/25/23 0348  WBC 6.3 5.4 5.3  NEUTROABS 4.4  3.3  --   HGB 10.1* 8.7* 8.1*  HCT 31.8* 27.4* 25.6*  MCV 94.6 94.5 95.5  PLT 203 172 180    Basic Metabolic Panel: Recent Labs  Lab 05/22/23 1440 05/23/23 0447 05/24/23 0416 05/25/23 0348  NA 140 141 139 140  K 3.3* 3.2* 3.1* 3.4*  CL 104 108 108 108  CO2 30 28 25 28   GLUCOSE 137* 58* 193* 118*  BUN 9 9 7* 5*  CREATININE 0.73 0.66 0.52* 0.41*  CALCIUM 8.0* 7.6* 7.3* 7.3*  MG  --  1.9 1.7 2.2    CBG: Recent Labs  Lab 05/24/23 1720 05/24/23 2048 05/25/23 0049 05/25/23 0442 05/25/23 0801  GLUCAP 125* 92 128* 111* 91    Recent Results (from the past 240 hour(s))  Culture, blood (routine x 2)     Status: None (Preliminary result)   Collection Time: 05/22/23  2:45 PM   Specimen: BLOOD  Result Value Ref Range  Status   Specimen Description BLOOD RIGHT ASSIST CONTROL  Final   Special Requests   Final    BOTTLES DRAWN AEROBIC AND ANAEROBIC Blood Culture adequate volume   Culture   Final    NO GROWTH 3 DAYS Performed at [facility], [address] [place], [place] [address]    Report Status PENDING  Incomplete  Culture, blood (routine x 2)     Status: None (Preliminary result)   Collection Time: 05/22/23  3:18 PM   Specimen: BLOOD  Result Value Ref Range Status   Specimen Description BLOOD BLOOD LEFT HAND AEROBIC BOTTLE ONLY  Final   Special Requests   Final    BOTTLES DRAWN AEROBIC ONLY Blood Culture adequate volume   Culture   Final    NO GROWTH 3 DAYS Performed at [facility], [address] [place], [place] [address]    Report Status PENDING  Incomplete  MRSA Next Gen by PCR, Nasal     Status: None   Collection Time: 05/22/23 10:10 PM   Specimen: Nasal Mucosa; Nasal Swab  Result Value Ref Range Status   MRSA by PCR Next Gen NOT DETECTED NOT DETECTED Final    Comment: (NOTE) The GeneXpert MRSA Assay (FDA approved for NASAL specimens only), is one component of a comprehensive MRSA colonization surveillance program. It is not intended to diagnose MRSA infection nor to guide or monitor treatment for MRSA infections. Test performance is not FDA approved in patients less than 70 years old. Performed at [facility], [address] [place], [place] [address]      Radiology Studies: DG CHEST PORT 1 VIEW  Result Date: 05/24/2023 CLINICAL DATA:  Shortness of breath EXAM: PORTABLE CHEST - 1 VIEW COMPARISON:  05/22/2023 FINDINGS: Persistent elevation of the left diaphragmatic leaflet. Worsening consolidation/atelectasis at the left lung base with suspected effusion. Right lung clear. Heart size and mediastinal contours are within normal limits. Cervical spine fixation hardware. IMPRESSION: Worsening left basilar consolidation/atelectasis with suspected effusion. Electronically Signed   By: Corlis Leak M.D.   On: 05/24/2023 12:37   US Abdomen Limited RUQ (LIVER/GB)  Result Date: 05/23/2023 CLINICAL DATA:  Acute pancreatitis. EXAM: ULTRASOUND ABDOMEN LIMITED RIGHT UPPER QUADRANT COMPARISON:  CT 05/22/2023 FINDINGS: Gallbladder: No gallstones or wall thickening visualized. No sonographic Murphy sign noted by sonographer. Common bile duct: Diameter: 4 mm Liver: No focal lesion identified. Within normal limits in parenchymal echogenicity. Portal vein is patent on color Doppler imaging with normal direction of blood flow towards the liver. Other: Scattered ascites.  Right-sided pleural effusion. IMPRESSION: No gallstones or ductal dilatation. Ascites. Right pleural effusion. Electronically Signed   By: Karen Kays M.D.   On: 05/23/2023 14:11    Scheduled Meds:  apixaban  5 mg Oral BID   cefadroxil  1,000 mg Oral BID   Chlorhexidine Gluconate Cloth  6 each Topical Daily   feeding supplement  237 mL Oral TID BM   ipratropium-albuterol  3 mL Nebulization Q6H   melatonin  3 mg Oral QHS   metoprolol tartrate  12.5 mg Oral BID   pantoprazole (PROTONIX) IV  40 mg Intravenous Q24H   potassium chloride  40 mEq Oral Once   Continuous Infusions:  dextrose 100 mL/hr at 05/25/23 0051     LOS: 3 days   Critical Care Procedure Note Authorized and Performed by: Maryln Manuel MD  Total Critical Care time:  61 mins  Due to a high probability of clinically significant, life threatening deterioration, the patient required my highest level of preparedness to intervene emergently and I personally spent this critical care time directly and personally managing the patient.  This critical care time included obtaining a history; examining the patient, pulse oximetry; ordering and review of studies; arranging urgent treatment with development of a management plan; evaluation of patient's response of treatment; frequent reassessment; and discussions with other providers.  This critical care time was performed to  assess and manage the high probability of imminent and life threatening deterioration that could result in multi-organ failure.  It was exclusive of separately billable procedures and treating other patients and teaching time.   Standley Dakins, MD How to contact the [facility] Attending or Consulting provider 7A - 7P or covering provider during after hours 7P -7A, for this patient?  Check the care team in [facility] and look for a) attending/consulting TRH provider listed and b) the [facility] team listed Log into [email or web address] to find provider on call.  Locate the [facility] provider you are looking for under [facility] and page to a number that you can be directly reached. If you still have difficulty reaching the provider, please page the [facility] (Director on Call) for the Hospitalists listed on amion for assistance.  05/25/2023, 10:45 AM

## 2023-05-26 DIAGNOSIS — R1013 Epigastric pain: Secondary | ICD-10-CM

## 2023-05-26 DIAGNOSIS — R6510 Systemic inflammatory response syndrome (SIRS) of non-infectious origin without acute organ dysfunction: Secondary | ICD-10-CM | POA: Diagnosis not present

## 2023-05-26 DIAGNOSIS — I480 Paroxysmal atrial fibrillation: Secondary | ICD-10-CM | POA: Diagnosis not present

## 2023-05-26 DIAGNOSIS — E861 Hypovolemia: Secondary | ICD-10-CM | POA: Diagnosis not present

## 2023-05-26 LAB — CBC
HCT: 26.6 % — ABNORMAL LOW (ref 39.0–52.0)
HCT: 28.4 % — ABNORMAL LOW (ref 39.0–52.0)
Hemoglobin: 8.2 g/dL — ABNORMAL LOW (ref 13.0–17.0)
Hemoglobin: 9 g/dL — ABNORMAL LOW (ref 13.0–17.0)
MCH: 29.9 pg (ref 26.0–34.0)
MCH: 30.6 pg (ref 26.0–34.0)
MCHC: 30.8 g/dL (ref 30.0–36.0)
MCHC: 31.7 g/dL (ref 30.0–36.0)
MCV: 97.1 fL (ref 80.0–100.0)
MCV: 96.6 fL (ref 80.0–100.0)
Platelets: 164 10*3/uL (ref 150–400)
Platelets: 180 10*3/uL (ref 150–400)
RBC: 2.74 MIL/uL — ABNORMAL LOW (ref 4.22–5.81)
RBC: 2.94 MIL/uL — ABNORMAL LOW (ref 4.22–5.81)
RDW: 16 % — ABNORMAL HIGH (ref 11.5–15.5)
RDW: 15.9 % — ABNORMAL HIGH (ref 11.5–15.5)
WBC: 4.7 10*3/uL (ref 4.0–10.5)
WBC: 4.9 10*3/uL (ref 4.0–10.5)
nRBC: 0 % (ref 0.0–0.2)
nRBC: 0 % (ref 0.0–0.2)

## 2023-05-26 LAB — GLUCOSE, CAPILLARY
Glucose-Capillary: 127 mg/dL — ABNORMAL HIGH (ref 70–99)
Glucose-Capillary: 131 mg/dL — ABNORMAL HIGH (ref 70–99)
Glucose-Capillary: 145 mg/dL — ABNORMAL HIGH (ref 70–99)
Glucose-Capillary: 147 mg/dL — ABNORMAL HIGH (ref 70–99)
Glucose-Capillary: 177 mg/dL — ABNORMAL HIGH (ref 70–99)

## 2023-05-26 LAB — MAGNESIUM: Magnesium: 1.8 mg/dL (ref 1.7–2.4)

## 2023-05-26 LAB — COMPREHENSIVE METABOLIC PANEL
ALT: 13 U/L (ref 0–44)
AST: 15 U/L (ref 15–41)
Albumin: <1.5 g/dL — ABNORMAL LOW (ref 3.5–5.0)
Alkaline Phosphatase: 71 U/L (ref 38–126)
Anion gap: 4 — ABNORMAL LOW (ref 5–15)
BUN: <5 mg/dL — ABNORMAL LOW (ref 8–23)
CO2: 28 mmol/L (ref 22–32)
Calcium: 7.3 mg/dL — ABNORMAL LOW (ref 8.9–10.3)
Chloride: 104 mmol/L (ref 98–111)
Creatinine, Ser: 0.41 mg/dL — ABNORMAL LOW (ref 0.61–1.24)
GFR, Estimated: >60 mL/min (ref >60)
Glucose, Bld: 115 mg/dL — ABNORMAL HIGH (ref 70–99)
Potassium: 3.4 mmol/L — ABNORMAL LOW (ref 3.5–5.1)
Sodium: 136 mmol/L (ref 135–145)
Total Bilirubin: 0.5 mg/dL (ref 0.3–1.2)
Total Protein: 4 g/dL — ABNORMAL LOW (ref 6.5–8.1)

## 2023-05-26 LAB — PHOSPHORUS: Phosphorus: 2.6 mg/dL (ref 2.5–4.6)

## 2023-05-26 LAB — CANCER ANTIGEN 19-9: CA 19-9: 25 U/mL (ref 0–35)

## 2023-05-26 MED ORDER — PANTOPRAZOLE SODIUM 40 MG IV SOLR
40.0000 mg | Freq: Two times a day (BID) | INTRAVENOUS | Status: DC
  Administered 2023-05-26 – 2023-05-29 (×6): 40 mg via INTRAVENOUS
  Filled 2023-05-26 (×6): qty 10

## 2023-05-26 MED ORDER — ADULT MULTIVITAMIN W/MINERALS CH
1.0000 | ORAL_TABLET | Freq: Every day | ORAL | Status: DC
  Administered 2023-05-26 – 2023-05-29 (×4): 1 via ORAL
  Filled 2023-05-26 (×4): qty 1

## 2023-05-26 MED ORDER — POTASSIUM CHLORIDE CRYS ER 20 MEQ PO TBCR
20.0000 meq | EXTENDED_RELEASE_TABLET | Freq: Two times a day (BID) | ORAL | Status: DC
  Administered 2023-05-26 – 2023-05-29 (×7): 20 meq via ORAL
  Filled 2023-05-26 (×7): qty 1

## 2023-05-26 MED ORDER — MAGNESIUM SULFATE 4 GM/100ML IV SOLN
4.0000 g | Freq: Once | INTRAVENOUS | Status: AC
  Administered 2023-05-26: 4 g via INTRAVENOUS
  Filled 2023-05-26: qty 100

## 2023-05-26 NOTE — TOC Progression Note (Addendum)
Transition of Care [facility]) - Progression Note    Patient Details  Name: Jim Ramirez MRN: [id number] Date of Birth: [birth year]  [facility] CM/SW Contact  Leitha Bleak, RN Phone Number: 05/26/2023, 11:16 AM  Clinical Narrative:   MD consulted TOC to request LTAC to review patient chart. [facility]erral [facility] to [facility]. TOC following.   Addendum: LTAC reviewed, not sure insurance will approve. Patient really wants to go back to [place] and continue rehab. TOC following.    Expected Discharge Plan: Home w Home Health Services Barriers to Discharge: Continued Medical Work up  Expected Discharge Plan and Services       Living arrangements for the past 2 months: Single Family Home                    HH Arranged: PT, RN   Date HH Agency Contacted: 05/23/23 Time HH Agency Contacted: 1540 Representative spoke with at [facility] Agency: Versie Starks   Social Determinants of Health (SDOH) Interventions SDOH Screenings   Food Insecurity: No Food Insecurity (05/22/2023)  Housing: Low Risk  (05/22/2023)  Transportation Needs: No Transportation Needs (05/22/2023)  Utilities: Not At Risk (05/22/2023)  Depression (PHQ2-9): Low Risk  (04/25/2023)  Financial Resource Strain: Low Risk  (05/01/2023)   Received from [facility]  Physical Activity: Inactive (05/01/2023)   [facility]  Social Connections: Socially Isolated (05/01/2023)   Received from [facility]  Stress: No Stress Concern Present (05/01/2023)   Received from [facility]  Tobacco Use: Medium Risk (05/22/2023)  Health Literacy: Medium Risk (05/01/2023)   Received from [facility]    Readmission Risk Interventions    05/23/2023    3:41 PM  Readmission Risk Prevention Plan  Transportation Screening Complete  PCP or Specialist Appt within 5-7 Days Not Complete  Home Care Screening Complete  Medication Review (RN CM) Complete

## 2023-05-26 NOTE — Progress Notes (Addendum)
Nutrition Follow-up  DOCUMENTATION CODES:   Not applicable, suspect malnutrition  INTERVENTION:   Magic cup TID with meals, each supplement provides 290 kcal and 9 grams of protein Ensure Plus High Protein po TID, each supplement provides 350 kcal and 20 grams of protein. MVI with minerals daily.  NUTRITION DIAGNOSIS:   Severe Malnutrition related to wound healing as evidenced by estimated needs.  GOAL:   Patient will meet greater than or equal to 90% of their needs  MONITOR:   Diet advancement, PO intake  REASON FOR ASSESSMENT:   Consult Assessment of nutrition requirement/status  ASSESSMENT:   72 yo male admitted with SIRS, hypotension, abdominal pain. PMH includes DM, HTN, DVT, kidney stones, spontaneous SAH in 2018, sleep apnea, discitis, osteomyelitis.  Patient reports that he has been eating poorly for the past several weeks d/t poor appetite. He ate 100% of lunch today (potato soup, jello, and vanilla pudding. He does not like the Ensure supplements, but has been trying to drink them because he knows that he needs it. We discussed supplement options. He agreed to try magic cups with meals to maximize oral intake of protein and calories. Recommended that he try the strawberry flavor Ensure, since that is a flavor he typically enjoys.   Labs and medications reviewed.  Patient being treated for idiopathic pancreatitis with associated gastritis/duodenitis. Diet has been advanced to full liquids and patient has been tolerating very well. He is hopeful to have solid foods tomorrow.   Patient meets criteria for severe malnutrition, given severe depletion of muscle and subcutaneous fat mass.  NUTRITION - FOCUSED PHYSICAL EXAM:  Flowsheet Row Most Recent Value  Orbital Region Severe depletion  Upper Arm Region Moderate depletion  Thoracic and Lumbar Region Moderate depletion  Buccal Region Severe depletion  Temple Region Severe depletion  Clavicle Bone Region Severe  depletion  Clavicle and Acromion Bone Region Moderate depletion  Scapular Bone Region Moderate depletion  Dorsal Hand Moderate depletion  Patellar Region Moderate depletion  Anterior Thigh Region Moderate depletion  Posterior Calf Region Severe depletion  Edema (RD Assessment) Moderate  Hair Reviewed  Eyes Reviewed  Mouth Reviewed  Skin Reviewed  Nails Reviewed       Diet Order:   Diet Order             Diet full liquid Room service appropriate? Yes; Fluid consistency: Thin  Diet effective now                   EDUCATION NEEDS:   No education needs have been identified at this time  Skin:  Skin Assessment: Skin Integrity Issues: Skin Integrity Issues:: Stage III, Unstageable Stage III: L heel Unstageable: vertebral column  Last BM:  10/27 type 6  Height:   Ht Readings from Last 1 Encounters:  05/26/23 6' (1.829 m)    Weight:   Wt Readings from Last 1 Encounters:  05/26/23 81.2 kg    Ideal Body Weight:  80.9 kg  BMI:  Body mass index is 24.28 kg/m.  Estimated Nutritional Needs:   Kcal:  2200-2400  Protein:  100-120 gm  Fluid:  2.2-2.4 L   Gabriel Rainwater RD, LDN, CNSC Please refer to Amion for contact information.

## 2023-05-26 NOTE — Progress Notes (Signed)
Gastroenterology Progress Note    Patient ID: Jim Ramirez; [id number]; Dec 10, 1950    Subjective   Denies abdominal pain, N/V. Ate half of broth this morning. Drank half protein shake. States his sister is bringing tomato soup for him, and he is excited about this.    Objective   Vital signs in last 24 hours Temp:  [97.4 F (36.3 C)-98.6 F (37 C)] 97.4 F (36.3 C) (10/28 0730) Pulse Rate:  [47-80] 56 (10/28 0722) Resp:  [8-24] 11 (10/28 0722) BP: (106-170)/(63-94) 134/66 (10/28 0700) SpO2:  [94 %-100 %] 99 % (10/28 0730) Weight:  [81.2 kg] 81.2 kg (10/28 0431) Last BM Date : 05/25/23  Physical Exam General:   Alert and oriented, pleasant, frail,  chronically ill-appearing Head:  Normocephalic and atraumatic. Abdomen:  Bowel sounds present, soft, non-tender, non-distended.  Neurologic:  Alert and  oriented x4  Intake/Output from previous day: 10/27 0701 - 10/28 0700 In: 240 [P.O.:240] Out: 3275 [Urine:3275] Intake/Output this shift: No intake/output data recorded.  Lab Results  Recent Labs    05/25/23 0348 05/26/23 0516  WBC 5.3 4.7  HGB 8.1* 8.2*  HCT 25.6* 26.6*  PLT 180 164   BMET Recent Labs    05/24/23 0416 05/25/23 0348 05/26/23 0516  NA 139 140 136  K 3.1* 3.4* 3.4*  CL 108 108 104  CO2 25 28 28   GLUCOSE 193* 118* 115*  BUN 7* 5* <5*  CREATININE 0.52* 0.41* 0.41*  CALCIUM 7.3* 7.3* 7.3*   LFT Recent Labs    05/24/23 0416 05/25/23 0348 05/26/23 0516  PROT 3.8* 3.7* 4.0*  ALBUMIN <1.5* <1.5* <1.5*  AST 15 15 15   ALT 12 14 13   ALKPHOS 66 68 71  BILITOT 0.3 0.3 0.5    Studies/Results DG CHEST PORT 1 VIEW  Result Date: 05/24/2023 CLINICAL DATA:  Shortness of breath EXAM: PORTABLE CHEST - 1 VIEW COMPARISON:  05/22/2023 FINDINGS: Persistent elevation of the left diaphragmatic leaflet. Worsening consolidation/atelectasis at the left lung base with suspected effusion. Right lung clear. Heart size and mediastinal contours are within  normal limits. Cervical spine fixation hardware. IMPRESSION: Worsening left basilar consolidation/atelectasis with suspected effusion. Electronically Signed   By: Corlis Leak M.D.   On: 05/24/2023 12:37   US Abdomen Limited RUQ (LIVER/GB)  Result Date: 05/23/2023 CLINICAL DATA:  Acute pancreatitis. EXAM: ULTRASOUND ABDOMEN LIMITED RIGHT UPPER QUADRANT COMPARISON:  CT 05/22/2023 FINDINGS: Gallbladder: No gallstones or wall thickening visualized. No sonographic Murphy sign noted by sonographer. Common bile duct: Diameter: 4 mm Liver: No focal lesion identified. Within normal limits in parenchymal echogenicity. Portal vein is patent on color Doppler imaging with normal direction of blood flow towards the liver. Other: Scattered ascites.  Right-sided pleural effusion. IMPRESSION: No gallstones or ductal dilatation. Ascites. Right pleural effusion. Electronically Signed   By: Karen Kays M.D.   On: 05/23/2023 14:11   CT ABDOMEN PELVIS WO CONTRAST  Result Date: 05/22/2023 CLINICAL DATA:  Acute nonlocalized abdominal pain. EXAM: CT ABDOMEN AND PELVIS WITHOUT CONTRAST TECHNIQUE: Multidetector CT imaging of the abdomen and pelvis was performed following the standard protocol without IV contrast. RADIATION DOSE REDUCTION: This exam was performed according to the departmental dose-optimization program which includes automated exposure control, adjustment of the mA and/or kV according to patient size and/or use of iterative reconstruction technique. COMPARISON:  CT chest abdomen and pelvis 12/22/2022 FINDINGS: Lower chest: Small bilateral pleural effusions with basilar atelectasis or consolidation, greater on the left. This could represent compressive atelectasis or  pneumonia. Elevation of the left hemidiaphragm. Hepatobiliary: No focal liver abnormality is seen. No gallstones, gallbladder wall thickening, or biliary dilatation. Pancreas: The pancreas is diffusely enlarged with edema around the pancreas and throughout  the upper abdomen. Appearances are likely to represent acute pancreatitis. No loculated collections are identified. Spleen: Normal in size without focal abnormality. Adrenals/Urinary Tract: No adrenal gland nodules. Kidneys are symmetrical. Bilateral intrarenal stones. Largest on the left measures 4 mm diameter. No hydronephrosis or hydroureter. Bladder wall is diffusely thickened, likely cystitis or outlet obstruction. Correlate with urinalysis. Stomach/Bowel: Stomach is decompressed. The gastric wall is diffusely thickened. This could be due to under distention or may indicate gastritis. Small bowel are decompressed. There is wall thickening of the retroperitoneal portion of the duodenal, likely reactive inflammation. Stool-filled colon without abnormal distention. Appendix is not identified. Vascular/Lymphatic: Calcification of the abdominal aorta. No aneurysm. IVC filter. No significant lymphadenopathy. Reproductive: Prostate is unremarkable. Other: Moderate free fluid in the abdomen and pelvis, likely ascites. No free air. Prominent diffuse soft tissue edema in the subcutaneous fat. Musculoskeletal: Degenerative changes in the spine and hips. Areas of lucency and sclerosis in the L4, L5, and S1 vertebrae. This is likely degenerative but could indicate metastatic disease in the appropriate clinical setting. Curvilinear sclerosis in the femoral head suggest avascular necrosis. IMPRESSION: 1. Diffuse enlargement of the pancreas with peripancreatic edema likely representing acute pancreatitis. Wall thickening of the duodenal is likely reactive. No loculated collections. 2. Diffuse gastric wall thickening may be due to under distention or gastritis. 3. Small bilateral pleural effusions with basilar atelectasis or consolidation, greater on the left. 4. Diffuse bladder wall thickening, possibly under outlet obstruction or cystitis. Correlate with urinalysis. 5. Moderate ascites. 6. Aortic atherosclerosis. 7. 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 Electronically Signed   By: Burman Nieves M.D.   On: 05/22/2023 23:51   DG Chest Port 1 View  Result Date: 05/22/2023 CLINICAL DATA:  Weakness. EXAM: PORTABLE CHEST 1 VIEW COMPARISON:  05/01/2023, chest CT 12/23/2023 FINDINGS: Stable heart size and mediastinal contours. Left basilar opacity is again seen, elevated hemidiaphragm and atelectasis on prior CT. There may be a small left pleural effusion. Streaky right infrahilar atelectasis. No pulmonary edema. No pneumothorax. IMPRESSION: 1. Left basilar opacity is in part related to elevated hemidiaphragm and atelectasis when compared prior CT. There may be a small left pleural effusion. 2. Streaky right infrahilar atelectasis. Electronically Signed   By: Narda Rutherford M.D.   On: 05/22/2023 16:27    Assessment  72 y.o. male with multiple medical issues now inpatient with acute pancreatitis felt idiopathic in light of negative evaluation thus far (no gallstones, normal triglycerides, no hypercalcemia, no ETOH/tobacco), clinically improving with resolution of abdominal pain. Findings of gastritis and duodenitis on imaging most likely secondary to acute pancreatitis. CA 19-9 and IgG 4 in process.   Tolerating full liquid diet, although amounts are small (1/2 cup of broth, 1/2 protein shake). He is quite excited about tomato soup his sister is bringing today. No indication for EGD at the moment. Hopefully, if he can continue slowly advancing his diet, can avoid enteral feeds. Appreciate Speech who evaluated patient and note regular/thin diet when able to advance.   Agree with palliative care consultation in light of multimorbidities.      Plan / Recommendations  Full liquids currently; hopefully can advance to soft this evening. Advance diet as tolerated IgG 4 and CA 19-9 pending Will need outpatient CT with pancreatic protocol 4-6 weeks  Appreciate  palliative care involvement    LOS: 4 days    05/26/2023, 9:29  AM  Gelene Mink, PhD, ANP-BC [facility]

## 2023-05-26 NOTE — Progress Notes (Signed)
Pt requested to keep foley catheter for some more time because of visitors at bedside. Educated the pt on the importance of removing the foley but pt still refused foley removal at this time until visitors at bedside leave. Will pass on the night shift to remove foley once visitors leave.

## 2023-05-26 NOTE — Progress Notes (Signed)
05/26/2023 2:40 PM  Event Note  Notified by nursing patient just had a dark bloody stool mixed with clots.  Discussed with GI team, getting a CBC, holding apixaban for now, increasing PPI coverage.  Further recommendations to follow.  Vitals holding stable.   Vitals:   05/26/23 1400 05/26/23 1410  BP:  (!) 121/52  Pulse: 68 70  Resp: 19 20  Temp:    SpO2: 96% 98%   Maryln Manuel, MD  How to contact the [facility] Attending or Consulting provider 7A - 7P or covering provider during after hours 7P -7A, for this patient?  Check the care team in [facility] and look for a) attending/consulting TRH provider listed and b) the [facility] team listed Log into [email or web address] and use [place]'s universal password to access. If you do not have the password, please contact the hospital operator. Locate the [facility] provider you are looking for under [facility] and page to a number that you can be directly reached. If you still have difficulty reaching the provider, please page the [facility] (Director on Call) for the Hospitalists listed on amion for assistance.

## 2023-05-26 NOTE — Plan of Care (Signed)
  Problem: Education: Goal: Knowledge of General Education information will improve Description: Including pain rating scale, medication(s)/side effects and non-pharmacologic comfort measures Outcome: Progressing   Problem: Clinical Measurements: Goal: Ability to maintain clinical measurements within normal limits will improve Outcome: Progressing   Problem: Clinical Measurements: Goal: Diagnostic test results will improve Outcome: Progressing   Problem: Activity: Goal: Risk for activity intolerance will decrease Outcome: Progressing

## 2023-05-26 NOTE — Progress Notes (Signed)
PROGRESS NOTE   Jim Ramirez  MVH:[id number] DOB: 1951-01-27 DOA: 05/22/2023 PCP: [name], MD   Chief Complaint  Patient presents with   Hypotension   Level of care: Stepdown  Brief Admission History:  72 y.o. 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  Patient was seen in the cardiology office prior to arrival where his blood pressure was reportedly 70/40 with dizziness and fatigue.  He was sent to the ER from there.  Patient reports he is actually here for abdominal pain.  He reports that he has abdominal pain that has been intermittent for 2 days.  It is unpredictable with no identified provoking or relieving factors.  He describes it as a crampy pain.  He has taken Pepto, Tylenol, Advil at home with no relief.  He reports whenever he stops his PPI he has "trouble."  He reports he was recently taken off of his Nexium for unknown reasons.  He was given Bentyl at admission that did not help.  Morphine seems to have helped.  He denies nausea, vomiting, diarrhea.  He reports constipation but then reports his last bowel movement was normal and it was 1 or 2 days ago.  He has had no fever.  As far as his hypotension goes he complains of dizziness and fatigue.  He reports he comes here from [facility] rehab where he is mostly nonambulatory.  He reports the dizziness is when he sits up.  He denies chest pain, but reports palpitations.  The palpitations have been present for 2 weeks.  He also cannot identify anything that provokes those.  He reports a decreased appetite.  He eats about half a meal twice per day.  He has decreased liquid p.o. intake as well.  On  review of systems patient does report he has had 2-3 months of peripheral edema.  Patient has no other complaints at this time.   He does not smoke and does not drink.  Patient reports he would want to be full code.  ACP documents were reviewed which specify a decision maker but no limitations.   Assessment and Plan:  Acute Pancreatitis - resolving  - he was treated with supportive measures with IV fluid and pain management - advanced to full liquids, hopeful to advance to soft food this evening - lipase trending down, he is clinically improving with no abdominal pain - he remains in stepdown ICU due to critical illness and frailty and co morbidities  - he appears generally unwell and prognosis guarded - requested for goals of care discussion with palliative care team, also has very low albumin which is a poor prognostic indicator in addition to recurrent severe hypoglycemia from poor liver glycogen reserve  SIRS (systemic inflammatory response syndrome) - resolved  - Patient sent in for hypotension, he also had hypothermia - No leukocytosis - Initial lactic acid 2.0 but then 1.1 after a fluid bolus - Chest x-ray shows some atelectasis but no definite infection - Blood cultures pending - UA is not indicative of UTI - Patient is under the treatment of infectious disease and currently getting Duricef - Continue Duricef, which is planned to be continued for at least 10 months following hospitalization for "lumbar spine discitis and osteomyelitis and  epidural abscess with stenosis and staph aureus/MSSA bacteremia, with negative TEE , S/P   left L3-4, L4-5 and L5-S1 laminotomy/foraminotomies for drainage of epidural abscess and decompression of the thecal sac by Dr. 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 Lovell Sheehan on 01/20/2023 "   Goals of care -- long conversation with patient and with his sister about poor prognosis and need for him to reconsider full code status as he likely would suffer more injury and painful suffering due to his severe deconditioned state   DVT prophylaxis: apixaban  Code Status: Full  Family Communication: long conversation with sister 10/26 discussing goals of care and poor prognosis, she verbalized understanding; sister planning in person visit today after church Disposition: I asked TOC to look into LTAC for him as he likely to require long hospitalization before he is strong enough to discharge   Consultants:  GI  Palliative care   Procedures:   Antimicrobials:    Subjective: Seems to be  tolerating small amounts of full liquids without abdominal pain.    Objective: Vitals:   05/26/23 0730 05/26/23 0800 05/26/23 0900 05/26/23 1000  BP:  (!) 145/67 (!) 121/52 133/72  Pulse:  65 (!) 50 64  Resp:  15 (!) 6   Temp: (!) 97.4 F (36.3 C)     TempSrc: Oral     SpO2: 99% 97% 98% 93%  Weight:      Height:        Intake/Output Summary (Last 24 hours) at 05/26/2023 1159 Last data filed at 05/26/2023 0806 Gross per 24 hour  Intake 240 ml  Output 2600 ml  Net -2360 ml   Filed Weights   05/22/23 1429 05/24/23 0420 05/26/23 0431  Weight: 79.4 kg 79.6 kg 81.2 kg   Examination:  General exam: cachectic, awake, alert, oriented x 3, pale, frail appearing, chronically ill appearing male; Appears Uncomfortable, and he appears very ill;  Respiratory system: no increased work of breathing.  Cardiovascular system: normal S1 & S2 heard. No JVD, murmurs, rubs, gallops or clicks. 1+ pedal edema. Gastrointestinal system: Abdomen is soft and nontender. No organomegaly or masses felt. Normal bowel sounds heard. Central nervous system: Alert and oriented. No focal neurological deficits. Extremities: Symmetric 5 x 5 power. Skin: No rashes, lesions or  ulcers. Psychiatry: Judgement and insight UTD. Mood & affect anxious.   Data Reviewed: I have personally reviewed following labs and imaging studies  CBC: Recent Labs  Lab 05/22/23 1440 05/23/23 0447 05/25/23 0348 05/26/23 0516  WBC 6.3 5.4 5.3 4.7  NEUTROABS 4.4 3.3  --   --   HGB 10.1* 8.7* 8.1* 8.2*  HCT 31.8* 27.4* 25.6* 26.6*  MCV 94.6 94.5 95.5 97.1  PLT 203 172 180 164    Basic Metabolic Panel: Recent Labs  Lab 05/22/23 1440 05/23/23 0447 05/24/23 0416 05/25/23 0348 05/26/23 0516  NA 140 141 139 140 136  K 3.3* 3.2* 3.1* 3.4* 3.4*  CL 104 108 108 108 104  CO2 30 28 25 28 28   GLUCOSE 137* 58* 193* 118* 115*  BUN 9 9 7* 5* <5*  CREATININE 0.73 0.66 0.52* 0.41* 0.41*  CALCIUM 8.0* 7.6* 7.3* 7.3* 7.3*  MG  --   1.9 1.7 2.2 1.8  PHOS  --   --   --   --  2.6    CBG: Recent Labs  Lab 05/25/23 1159 05/25/23 1621 05/25/23 2026 05/26/23 0022 05/26/23 0434  GLUCAP 140* 197* 160* 147* 131*    Recent Results (from the past 240 hour(s))  Culture, blood (routine x 2)     Status: None (Preliminary result)   Collection Time: 05/22/23  2:45 PM   Specimen: BLOOD  Result Value Ref Range Status   Specimen Description BLOOD RIGHT ASSIST CONTROL  Final   Special Requests   Final    BOTTLES DRAWN AEROBIC AND ANAEROBIC Blood Culture adequate volume   Culture   Final    NO GROWTH 4 DAYS Performed at [facility], [address], [place], [place] [address]    Report Status PENDING  Incomplete  Culture, blood (routine x 2)     Status: None (Preliminary result)   Collection Time: 05/22/23  3:18 PM   Specimen: BLOOD  Result Value Ref Range Status   Specimen Description BLOOD BLOOD LEFT HAND AEROBIC BOTTLE ONLY  Final   Special Requests   Final    BOTTLES DRAWN AEROBIC ONLY Blood Culture adequate volume   Culture   Final    NO GROWTH 4 DAYS Performed at [facility], [address], [place], [place] [address]    Report Status PENDING  Incomplete  MRSA Next Gen by PCR, Nasal     Status: None   Collection Time: 05/22/23 10:10 PM   Specimen: Nasal Mucosa; Nasal Swab  Result Value Ref Range Status   MRSA by PCR Next Gen NOT DETECTED NOT DETECTED Final    Comment: (NOTE) The GeneXpert MRSA Assay (FDA approved for NASAL specimens only), is one component of a comprehensive MRSA colonization surveillance program. It is not intended to diagnose MRSA infection nor to guide or monitor treatment for MRSA infections. Test performance is not FDA approved in patients less than 27 years old. Performed at [facility], [address], [place], [place] [address]      Radiology Studies: No results found.  Scheduled Meds:  apixaban  5 mg Oral BID   cefadroxil  1,000 mg Oral BID   Chlorhexidine Gluconate Cloth  6  each Topical Daily   feeding supplement  237 mL Oral TID BM   ipratropium-albuterol  3 mL Nebulization TID   melatonin  3 mg Oral QHS   metoprolol tartrate  25 mg Oral BID   pantoprazole (PROTONIX) IV  40 mg Intravenous Q24H   potassium chloride  20 mEq Oral BID   Continuous Infusions:  dextrose 50 mL/hr at 05/26/23 0202   magnesium sulfate bolus IVPB 4 g (05/26/23 1137)     LOS: 4 days   Critical Care Procedure Note Authorized and Performed by: Maryln Manuel MD  Total Critical Care time:  55 mins  Due to a high probability of clinically significant, life threatening deterioration, the patient required my highest level of preparedness to intervene emergently and I personally spent this critical care time directly and personally managing the patient.  This critical care time included obtaining a history; examining the patient, pulse oximetry; ordering and review of studies; arranging urgent treatment with development of a management plan; evaluation of patient's response of treatment; frequent reassessment; and discussions with other providers.  This critical care time was performed to assess and manage the high probability of imminent and life threatening deterioration that could result in multi-organ failure.  It was exclusive of separately billable procedures and treating other patients and teaching time.   Standley Dakins, MD How to contact the [facility] Attending or Consulting provider 7A - 7P or covering provider during after hours 7P -7A, for this patient?  Check the care team in [facility] and look for a) attending/consulting TRH provider listed and b) the [facility] team listed Log into [email or web address] to find provider on call.  Locate the [facility] provider you are looking for under [facility] and page to a number that you can be directly reached. If you still have difficulty reaching the provider, please page the [facility] (Director on Call) for the Hospitalists listed on amion for assistance.  05/26/2023,  11:59 AM

## 2023-05-27 DIAGNOSIS — K2970 Gastritis, unspecified, without bleeding: Secondary | ICD-10-CM | POA: Diagnosis not present

## 2023-05-27 DIAGNOSIS — K625 Hemorrhage of anus and rectum: Secondary | ICD-10-CM | POA: Diagnosis not present

## 2023-05-27 DIAGNOSIS — E876 Hypokalemia: Secondary | ICD-10-CM | POA: Diagnosis not present

## 2023-05-27 DIAGNOSIS — L89603 Pressure ulcer of unspecified heel, stage 3: Secondary | ICD-10-CM | POA: Diagnosis not present

## 2023-05-27 DIAGNOSIS — K859 Acute pancreatitis without necrosis or infection, unspecified: Secondary | ICD-10-CM | POA: Diagnosis not present

## 2023-05-27 DIAGNOSIS — K2980 Duodenitis without bleeding: Secondary | ICD-10-CM | POA: Diagnosis not present

## 2023-05-27 DIAGNOSIS — R6510 Systemic inflammatory response syndrome (SIRS) of non-infectious origin without acute organ dysfunction: Secondary | ICD-10-CM | POA: Diagnosis not present

## 2023-05-27 DIAGNOSIS — I480 Paroxysmal atrial fibrillation: Secondary | ICD-10-CM | POA: Diagnosis not present

## 2023-05-27 LAB — IGG 4: IgG, Subclass 4: 49 mg/dL (ref 2–96)

## 2023-05-27 LAB — BASIC METABOLIC PANEL
Anion gap: 4 — ABNORMAL LOW (ref 5–15)
BUN: <5 mg/dL — ABNORMAL LOW (ref 8–23)
CO2: 29 mmol/L (ref 22–32)
Calcium: 7.5 mg/dL — ABNORMAL LOW (ref 8.9–10.3)
Chloride: 104 mmol/L (ref 98–111)
Creatinine, Ser: 0.43 mg/dL — ABNORMAL LOW (ref 0.61–1.24)
GFR, Estimated: >60 mL/min (ref >60)
Glucose, Bld: 112 mg/dL — ABNORMAL HIGH (ref 70–99)
Potassium: 3.7 mmol/L (ref 3.5–5.1)
Sodium: 137 mmol/L (ref 135–145)

## 2023-05-27 LAB — CBC
HCT: 28 % — ABNORMAL LOW (ref 39.0–52.0)
Hemoglobin: 8.6 g/dL — ABNORMAL LOW (ref 13.0–17.0)
MCH: 29.4 pg (ref 26.0–34.0)
MCHC: 30.7 g/dL (ref 30.0–36.0)
MCV: 95.6 fL (ref 80.0–100.0)
Platelets: 183 10*3/uL (ref 150–400)
RBC: 2.93 MIL/uL — ABNORMAL LOW (ref 4.22–5.81)
RDW: 15.6 % — ABNORMAL HIGH (ref 11.5–15.5)
WBC: 5.6 10*3/uL (ref 4.0–10.5)
nRBC: 0 % (ref 0.0–0.2)

## 2023-05-27 LAB — CULTURE, BLOOD (ROUTINE X 2)
Culture: NO GROWTH
Culture: NO GROWTH
Special Requests: ADEQUATE
Special Requests: ADEQUATE

## 2023-05-27 LAB — MAGNESIUM: Magnesium: 1.9 mg/dL (ref 1.7–2.4)

## 2023-05-27 LAB — GLUCOSE, CAPILLARY
Glucose-Capillary: 115 mg/dL — ABNORMAL HIGH (ref 70–99)
Glucose-Capillary: 99 mg/dL (ref 70–99)
Glucose-Capillary: 362 mg/dL — ABNORMAL HIGH (ref 70–99)
Glucose-Capillary: 135 mg/dL — ABNORMAL HIGH (ref 70–99)

## 2023-05-27 NOTE — TOC Progression Note (Signed)
Transition of Care [facility]) - Progression Note    Patient Details  Name: Jim Ramirez MRN: [id number] Date of Birth: 27-May-1951  Transition of Care [facility] CM/SW Contact  Leitha Bleak, RN Phone Number: 05/27/2023, 10:07 AM  Clinical Narrative:   PT eval this morning recommending SNF.  CM spoke with Revonda Standard they will accept him back and would like Authorization. He is pending medicaid approval.  TOC CMA starting Auth, DC planning for 2 days.     Expected Discharge Plan: Home w Home Health Services Barriers to Discharge: Continued Medical Work up  Expected Discharge Plan and Services       Living arrangements for the past 2 months: Single Family Home                           HH Arranged: PT, RN   Date HH Agency Contacted: 05/23/23 Time HH Agency Contacted: 1540 Representative spoke with at [facility] Agency: Versie Starks   Social Determinants of Health (SDOH) Interventions SDOH Screenings   Food Insecurity: No Food Insecurity (05/22/2023)  Housing: Low Risk  (05/22/2023)  Transportation Needs: No Transportation Needs (05/22/2023)  Utilities: Not At Risk (05/22/2023)  Depression (PHQ2-9): Low Risk  (04/25/2023)  Financial Resource Strain: Low Risk  (05/01/2023)   [facility] from [facility]  Physical Activity: Inactive (05/01/2023)   Received from [facility]  Social Connections: Socially Isolated (05/01/2023)   Received from [facility]  Stress: No Stress Concern Present (05/01/2023)   Received from [facility]  Tobacco Use: Medium Risk (05/22/2023)  Health Literacy: Medium Risk (05/01/2023)   Received from [facility]    Readmission Risk Interventions    05/23/2023    3:41 PM  Readmission Risk Prevention Plan  Transportation Screening Complete  PCP or Specialist Appt within 5-7 Days Not Complete  Home Care Screening Complete  Medication Review (RN CM) Complete

## 2023-05-27 NOTE — Progress Notes (Signed)
Pt tolerated and ate all of his tomato soup his sister brought for him. No nausea or discomfort eating it.   Soft diet dinner tray pt ate all his PB&J sandwich without complication.

## 2023-05-27 NOTE — Evaluation (Signed)
Physical Therapy Evaluation Patient Details Name: Jim Ramirez MRN: [id number] DOB: 22-Jan-1951 Today's Date: 05/27/2023  History of Present Illness  Jim Ramirez is a 72 y.o. 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  Patient was actually seen in the cardiology office today where his blood pressure was reportedly 70/40 with dizziness and fatigue.  He was sent to the ER from there.  Patient reports he is actually here for abdominal pain.  He reports that he has abdominal pain that has been intermittent for 2 days.  It is unpredictable with no identified provoking or relieving factors.  He describes it as a crampy pain.  He has taken Pepto, Tylenol, Advil at home with no relief.  He reports whenever he stops his PPI he has "trouble."  He reports he was recently taken off of his Nexium for unknown reasons.  He was given Bentyl at admission that did not help.  Morphine seems to have helped.  He denies nausea, vomiting, diarrhea.  He reports constipation but then reports his last bowel movement was normal and it was 1 or 2 days ago.  He has had no fever.  As far as his hypotension goes he complains of dizziness and fatigue.  He reports he comes here from [facility] rehab where he is mostly nonambulatory.  He reports the dizziness is when he sits up.  He denies chest pain, but reports palpitations.  The palpitations have been present for 2 weeks.  He also cannot identify anything that provokes those.  He reports a decreased appetite.  He eats about half a meal twice per day.  He has decreased liquid p.o. intake as well.  On review of systems patient does report he has had  2-3 months of peripheral edema.  Patient has no other complaints at this time.   Clinical Impression  Patient demonstrates slow labored movement for sitting up at bedside, unsteady on feet and limited to a few side steps using RW due to BLE weakness and c/o fatigue.  Patient tolerated sitting up in chair after therapy - nursing staff notified.  Patient will benefit from continued skilled physical therapy in hospital and recommended venue below to increase strength, balance, endurance for safe ADLs and gait.           If plan is discharge home, recommend the following: A lot of help with walking and/or transfers;A lot of help with bathing/dressing/bathroom;Help with stairs or ramp for entrance;Assistance with cooking/housework   Can travel by private vehicle   Yes    Equipment Recommendations None recommended by PT  Recommendations for Other Services       Functional Status Assessment Patient has had a recent decline in their functional status and demonstrates the ability to make significant improvements in function in a reasonable and predictable amount of time.     Precautions / Restrictions Precautions Precautions: Fall Restrictions Weight Bearing Restrictions: No      Mobility  Bed Mobility Overal bed mobility: Needs Assistance Bed Mobility: Supine to Sit     Supine to sit: Mod assist     General bed mobility comments: increased time, labored movement    Transfers Overall transfer level: Needs assistance Equipment used: Rolling walker (2 wheels) Transfers:  Sit to/from Stand, Bed to chair/wheelchair/BSC Sit to Stand: Mod assist   Step pivot transfers: Mod assist       General transfer comment: unsteady labored movement    Ambulation/Gait Ambulation/Gait assistance: Mod assist Gait Distance (Feet): 4 Feet Assistive device: Rolling walker (2 wheels) Gait Pattern/deviations: Decreased step length - right, Decreased step length - left, Decreased stride length,  Knees buckling Gait velocity: slow     General Gait Details: limited to a few slow labored side steps due to BLE weakness, fall risk  Stairs            Wheelchair Mobility     Tilt Bed    Modified Rankin (Stroke Patients Only)       Balance Overall balance assessment: Needs assistance Sitting-balance support: Feet supported, No upper extremity supported Sitting balance-Leahy Scale: Fair Sitting balance - Comments: seated at EOB   Standing balance support: Reliant on assistive device for balance, During functional activity, Bilateral upper extremity supported Standing balance-Leahy Scale: Poor Standing balance comment: fair/poor using RW                             Pertinent Vitals/Pain Pain Assessment Pain Assessment: No/denies pain    Home Living Family/patient expects to be discharged to:: Private residence Living Arrangements: Other relatives Available Help at Discharge: Family;Available 24 hours/day Type of Home: Apartment Home Access: Level entry       Home Layout: One level Home Equipment: Agricultural consultant (2 wheels);BSC/3in1 Additional Comments: information taken from previous admission due to patient is poor historian at this time    Prior Function Prior Level of Function : Independent/Modified Independent;Driving             Mobility Comments: indp prior "per patient" ADLs Comments: Ind with ADLs, IADLs, cooks and drivesl. 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     Extremity/Trunk Assessment   Upper Extremity Assessment Upper Extremity Assessment: Generalized weakness    Lower Extremity Assessment Lower Extremity Assessment: Generalized weakness    Cervical / Trunk Assessment Cervical / Trunk Assessment: Normal  Communication   Communication Communication: Difficulty communicating thoughts/reduced clarity of speech Cueing Techniques: Verbal cues;Tactile cues  Cognition Arousal: Alert Behavior During Therapy: WFL for tasks  assessed/performed Overall Cognitive Status: No family/caregiver present to determine baseline cognitive functioning                                          General Comments      Exercises     Assessment/Plan    PT Assessment Patient needs continued PT services  PT Problem List Decreased strength;Decreased activity tolerance;Decreased balance;Decreased mobility       PT Treatment Interventions DME instruction;Gait training;Stair training;Functional mobility training;Therapeutic activities;Therapeutic exercise;Balance training;Patient/family education    PT Goals (Current goals can be found in the Care Plan section)  Acute Rehab PT Goals Patient Stated Goal: return home PT Goal Formulation: With patient Time For Goal Achievement: 06/10/23 Potential to Achieve Goals: Good    Frequency Min 3X/week     Co-evaluation               AM-PAC PT "6 Clicks" Mobility  Outcome Measure Help needed turning from your back to your side while in a flat bed without using bedrails?: A Little Help needed moving from lying on your back to sitting on the side  of a flat bed without using bedrails?: A Little Help needed moving to and from a bed to a chair (including a wheelchair)?: A Lot Help needed standing up from a chair using your arms (e.g., wheelchair or bedside chair)?: A Lot Help needed to walk in hospital room?: A Lot Help needed climbing 3-5 steps with a railing? : Total 6 Click Score: 13    End of Session   Activity Tolerance: Patient tolerated treatment well;Patient limited by fatigue Patient left: in chair;with call bell/phone within reach Nurse Communication: Mobility status PT Visit Diagnosis: Unsteadiness on feet (R26.81);Other abnormalities of gait and mobility (R26.89);Muscle weakness (generalized) (M62.81)    Time: 1610-9604 PT Time Calculation (min) (ACUTE ONLY): 20 min   Charges:   PT Evaluation $PT Eval Moderate Complexity: 1 Mod PT  Treatments $Therapeutic Activity: 8-22 mins PT General Charges $$ ACUTE PT VISIT: 1 Visit         2:11 PM, 05/27/23 Ocie Bob, MPT Physical Therapist with [facility] [phone] office [phone] mobile phone

## 2023-05-27 NOTE — Plan of Care (Signed)

## 2023-05-27 NOTE — Progress Notes (Signed)
PROGRESS NOTE   Jim Ramirez  VWU:[id number] DOB: 05/24/1951 DOA: 05/22/2023 PCP: [name], MD   Chief Complaint  Patient presents with   Hypotension   Level of care: Stepdown  Brief Admission History:  72 y.o. 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  Patient was seen in the cardiology office prior to arrival where his blood pressure was reportedly 70/40 with dizziness and fatigue.  He was sent to the ER from there.  Patient reports he is actually here for abdominal pain.  He reports that he has abdominal pain that has been intermittent for 2 days.  It is unpredictable with no identified provoking or relieving factors.  He describes it as a crampy pain.  He has taken Pepto, Tylenol, Advil at home with no relief.  He reports whenever he stops his PPI he has "trouble."  He reports he was recently taken off of his Nexium for unknown reasons.  He was given Bentyl at admission that did not help.  Morphine seems to have helped.  He denies nausea, vomiting, diarrhea.  He reports constipation but then reports his last bowel movement was normal and it was 1 or 2 days ago.  He has had no fever.  As far as his hypotension goes he complains of dizziness and fatigue.  He reports he comes here from [facility] rehab where he is mostly nonambulatory.  He reports the dizziness is when he sits up.  He denies chest pain, but reports palpitations.  The palpitations have been present for 2 weeks.  He also cannot identify anything that provokes those.  He reports a decreased appetite.  He eats about half a meal twice per day.  He has decreased liquid p.o. intake as well.  On  review of systems patient does report he has had 2-3 months of peripheral edema.  Patient has no other complaints at this time.   He does not smoke and does not drink.  Patient reports he would want to be full code.  ACP documents were reviewed which specify a decision maker but no limitations.   Assessment and Plan:  Acute Pancreatitis - resolving  - he was treated with supportive measures with IV fluid and pain management - advanced to full liquids, hopeful to advance to soft food this evening - lipase trending down, he is clinically improving with no abdominal pain - he remains in stepdown ICU due to critical illness and frailty and co morbidities  - he appears generally unwell and prognosis guarded - appreciate GI team consult and recommendations - requested for goals of care discussion with palliative care team, also has very low albumin which is a poor prognostic indicator in addition to recurrent severe hypoglycemia from poor liver glycogen reserve  SIRS (systemic inflammatory response syndrome) - resolved  - Patient sent in for hypotension, he also had hypothermia - No leukocytosis - Initial lactic acid 2.0 but then 1.1 after a fluid bolus - Chest x-ray shows some atelectasis but no definite infection - Blood cultures pending - UA is not indicative of UTI - Patient is under the treatment of infectious disease and currently getting Duricef - Continue Duricef, which is planned to be continued for at least 10 months following hospitalization  for "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 Lovell Sheehan on 01/20/2023 "   Goals of care -- long conversation with patient and with his sister about poor prognosis and need for him to reconsider full code status as he likely would suffer more injury and painful suffering due to his severe deconditioned state   DVT prophylaxis: apixaban  Code Status: Full  Family Communication: long conversation with sister 10/26 discussing goals of care and poor prognosis, she verbalized understanding; sister planning in person visit today after church Disposition: I asked TOC to look into LTAC but likely to return to his current SNF  Consultants:  GI   Palliative care   Procedures:   Antimicrobials:    Subjective: Pt had several bloody stools yesterday, he is reports some nausea but no abdominal pain or vomiting.     Objective: Vitals:   05/27/23 0736 05/27/23 0817 05/27/23 1213 05/27/23 1421  BP:      Pulse:      Resp:      Temp: 97.9 F (36.6 C)  98.4 F (36.9 C)   TempSrc: Oral  Oral   SpO2:  95%  96%  Weight:      Height:        Intake/Output Summary (Last 24 hours) at 05/27/2023 1701 Last data filed at 05/27/2023 1330 Gross per 24 hour  Intake 210 ml  Output 3150 ml  Net -2940 ml   Filed Weights   05/24/23 0420 05/26/23 0431 05/27/23 0213  Weight: 79.6 kg 81.2 kg 77.4 kg   Examination:  General exam: cachectic, awake, alert, oriented x 3, pale, frail appearing, chronically ill appearing male; Appears Uncomfortable, and he appears very ill;  Respiratory system: no increased work of breathing.  Cardiovascular system: normal S1 & S2 heard. No JVD, murmurs, rubs, gallops or clicks. 1+ pedal edema. Gastrointestinal system: Abdomen is soft and nontender. No organomegaly or masses felt. Normal bowel sounds heard. Central nervous system:  Alert and oriented. No focal neurological deficits. Extremities: Symmetric 5 x 5 power. Skin: No rashes, lesions or ulcers. Psychiatry: Judgement and insight UTD. Mood & affect anxious.   Data Reviewed: I have personally reviewed following labs and imaging studies  CBC: Recent Labs  Lab 05/22/23 1440 05/23/23 0447 05/25/23 0348 05/26/23 0516 05/26/23 1552 05/27/23 0512  WBC 6.3 5.4 5.3 4.7 4.9 5.6  NEUTROABS 4.4 3.3  --   --   --   --   HGB 10.1* 8.7* 8.1* 8.2* 9.0* 8.6*  HCT 31.8* 27.4* 25.6* 26.6* 28.4* 28.0*  MCV 94.6 94.5 95.5 97.1 96.6 95.6  PLT 203 172 180 164 180 183    Basic Metabolic Panel: Recent Labs  Lab 05/23/23 0447 05/24/23 0416 05/25/23 0348 05/26/23 0516 05/27/23 0512  NA 141 139 140 136 137  K 3.2* 3.1* 3.4* 3.4* 3.7  CL 108 108 108 104 104   CO2 28 25 28 28 29   GLUCOSE 58* 193* 118* 115* 112*  BUN 9 7* 5* <5* <5*  CREATININE 0.66 0.52* 0.41* 0.41* 0.43*  CALCIUM 7.6* 7.3* 7.3* 7.3* 7.5*  MG 1.9 1.7 2.2 1.8 1.9  PHOS  --   --   --  2.6  --     CBG: Recent Labs  Lab 05/26/23 2012 05/26/23 2314 05/27/23 0356 05/27/23 0731 05/27/23 1212  GLUCAP 145* 127* 115* 99 362*    Recent Results (from the past 240 hour(s))  Culture, blood (routine x 2)     Status: None   Collection Time: 05/22/23  2:45 PM   Specimen: BLOOD  Result Value Ref Range Status   Specimen Description BLOOD RIGHT ASSIST CONTROL  Final   Special Requests   Final    BOTTLES DRAWN AEROBIC AND ANAEROBIC Blood Culture adequate volume   Culture   Final    NO GROWTH 5 DAYS Performed at [facility], [address], [place], [place] [address]    Report Status 05/27/2023 FINAL  Final  Culture, blood (routine x 2)     Status: None   Collection Time: 05/22/23  3:18 PM   Specimen: BLOOD  Result Value Ref Range Status   Specimen Description BLOOD BLOOD LEFT HAND AEROBIC BOTTLE ONLY  Final   Special Requests   Final    BOTTLES DRAWN AEROBIC ONLY Blood Culture adequate volume   Culture   Final    NO GROWTH 5 DAYS Performed at [facility], [address] [place], [place] [address]    Report Status 05/27/2023 FINAL  Final  MRSA Next Gen by PCR, Nasal     Status: None   Collection Time: 05/22/23 10:10 PM   Specimen: Nasal Mucosa; Nasal Swab  Result Value Ref Range Status   MRSA by PCR Next Gen NOT DETECTED NOT DETECTED Final    Comment: (NOTE) The GeneXpert MRSA Assay (FDA approved for NASAL specimens only), is one component of a comprehensive MRSA colonization surveillance program. It is not intended to diagnose MRSA infection nor to guide or monitor treatment for MRSA infections. Test performance is not FDA approved in patients less than 63 years old. Performed at [facility], [address], [place], [place] [address]      Radiology Studies: No  results found.  Scheduled Meds:  cefadroxil  1,000 mg Oral BID   Chlorhexidine Gluconate Cloth  6 each Topical Daily   feeding supplement  237 mL Oral TID BM   ipratropium-albuterol  3 mL Nebulization TID   melatonin  3  mg Oral QHS   metoprolol tartrate  25 mg Oral BID   multivitamin with minerals  1 tablet Oral Daily   pantoprazole (PROTONIX) IV  40 mg Intravenous Q12H   potassium chloride  20 mEq Oral BID   Continuous Infusions:  dextrose 30 mL/hr at 05/27/23 0443     LOS: 5 days   Critical Care Procedure Note Authorized and Performed by: Maryln Manuel MD  Total Critical Care time:  57 mins  Due to a high probability of clinically significant, life threatening deterioration, the patient required my highest level of preparedness to intervene emergently and I personally spent this critical care time directly and personally managing the patient.  This critical care time included obtaining a history; examining the patient, pulse oximetry; ordering and review of studies; arranging urgent treatment with development of a management plan; evaluation of patient's response of treatment; frequent reassessment; and discussions with other providers.  This critical care time was performed to assess and manage the high probability of imminent and life threatening deterioration that could result in multi-organ failure.  It was exclusive of separately billable procedures and treating other patients and teaching time.   Standley Dakins, MD How to contact the [facility] Attending or Consulting provider 7A - 7P or covering provider during after hours 7P -7A, for this patient?  Check the care team in [facility] and look for a) attending/consulting TR[facility] provider listed and b) the [facility] team listed Log into [email or web address] to find provider on call.  Locate the [facility] provider you are looking for under [facility] and page to a number that you can be directly reached. If you still have difficulty reaching the provider, please  page the [facility] (Director on Call) for the Hospitalists listed on amion for assistance.  05/27/2023, 5:01 PM

## 2023-05-27 NOTE — Plan of Care (Signed)
  Problem: Acute Rehab PT Goals(only PT should resolve) Goal: Pt Will Go Supine/Side To Sit Outcome: Progressing Flowsheets (Taken 05/27/2023 1412) Pt will go Supine/Side to Sit: with minimal assist Goal: Patient Will Transfer Sit To/From Stand Outcome: Progressing Flowsheets (Taken 05/27/2023 1412) Patient will transfer sit to/from stand: with minimal assist Goal: Pt Will Transfer Bed To Chair/Chair To Bed Outcome: Progressing Flowsheets (Taken 05/27/2023 1412) Pt will Transfer Bed to Chair/Chair to Bed:  with min assist  with mod assist Goal: Pt Will Ambulate Outcome: Progressing Flowsheets (Taken 05/27/2023 1412) Pt will Ambulate:  25 feet  with minimal assist  with rolling walker   2:12 PM, 05/27/23 Ocie Bob, MPT Physical Therapist with [facility] [phone] office [phone] mobile phone

## 2023-05-27 NOTE — Progress Notes (Signed)
Subjective: Sleeping when I entered the room but is arousable. Falls back asleep fairly quickly however. Nurse Norva Pavlov reports patient had some confusion overnight and received ativan, but this morning, he was alert and oriented. States patient is very weak and used pretty much all of his energy getting up out of bed this morning to sit in the chair. He has had no rectal bleeding today. Reports having 2 episodes of red blood per rectum yesterday and a black stool overnight.   Patient denies abdominal pain, nausea, or vomiting. Nurse states he has been tolerating Ensure and liquids well but just doesn't consume much. States patient has been asking about regular food. Sister is supposed to bring tomato soup today as she wasn't able to yesterday.   Prior colonoscopy/EGD  Objective: Vital signs in last 24 hours: Temp:  [97.4 F (36.3 C)-98.3 F (36.8 C)] 97.9 F (36.6 C) (10/29 0736) Pulse Rate:  [52-72] 62 (10/29 0700) Resp:  [13-22] 15 (10/29 0700) BP: (103-159)/(52-67) 136/61 (10/29 0700) SpO2:  [94 %-99 %] 95 % (10/29 0817) Weight:  [77.4 kg] 77.4 kg (10/29 0213) Last BM Date : 05/25/23 General:   Somnolent, frail, chronically ill appearing.  Head:  Normocephalic and atraumatic. Abdomen:  Bowel sounds present, soft, non-tender, non-distended.  Msk:  Symmetrical without gross deformities. Normal posture. Extremities:  Without edema.  Intake/Output from previous day: 10/28 0701 - 10/29 0700 In: 360 [P.O.:360] Out: 3150 [Urine:3150] Intake/Output this shift: Total I/O In: 120 [P.O.:120] Out: -   Lab Results: Recent Labs    05/26/23 0516 05/26/23 1552 05/27/23 0512  WBC 4.7 4.9 5.6  HGB 8.2* 9.0* 8.6*  HCT 26.6* 28.4* 28.0*  PLT 164 180 183   BMET Recent Labs    05/25/23 0348 05/26/23 0516 05/27/23 0512  NA 140 136 137  K 3.4* 3.4* 3.7  CL 108 104 104  CO2 28 28 29   GLUCOSE 118* 115* 112*  BUN 5* <5* <5*  CREATININE 0.41* 0.41* 0.43*  CALCIUM 7.3* 7.3* 7.5*    LFT Recent Labs    05/25/23 0348 05/26/23 0516  PROT 3.7* 4.0*  ALBUMIN <1.5* <1.5*  AST 15 15  ALT 14 13  ALKPHOS 68 71  BILITOT 0.3 0.5   Assessment: 72 y.o. male with multiple medical issues now inpatient with acute pancreatitis felt idiopathic in light of negative evaluation thus far (no gallstones, normal triglycerides, no hypercalcemia, no ETOH/tobacco, normal CA 19-9, IgG 4 remains in process). Clinically improving with resolution of abdominal pain. Tolerating small amounts of liquid diet. Has been excited to have tomato soup from his sister today as he wasn't able to get it yesterday. Should be fine to advance to soft diet today.   Findings of gastritis and duodenitis on imaging may very well be secondary to pancreatitis. However, there were reports of rectal bleeding x 2 yesterday and a black stool overnight. Hgb remaining fairly stable and Dr. Marletta Lor was able to see the stool himself which appeared mostly brown with a few spots of red blood. For now, we will continue IV PPI BID, monitor H/H, and for recurrent overt GI bleeding, and  hold off on endoscopic evaluation.   Plan: Advance to soft diet.  Continue IV PPI BID Monitor for overt GI bleeding.  Monitor H/H and transfuse if <7 Follow-up on IgG 4 Will need outpatient CT with pancreatic protocol in 4-6 weeks.  Appreciate palliative care involvement    LOS: 5 days    05/27/2023, 10:10 AM  Ermalinda Memos, PA-C [facility]

## 2023-05-28 DIAGNOSIS — K859 Acute pancreatitis without necrosis or infection, unspecified: Secondary | ICD-10-CM | POA: Diagnosis not present

## 2023-05-28 DIAGNOSIS — R6510 Systemic inflammatory response syndrome (SIRS) of non-infectious origin without acute organ dysfunction: Secondary | ICD-10-CM | POA: Diagnosis not present

## 2023-05-28 DIAGNOSIS — K2980 Duodenitis without bleeding: Secondary | ICD-10-CM | POA: Diagnosis not present

## 2023-05-28 DIAGNOSIS — K2970 Gastritis, unspecified, without bleeding: Secondary | ICD-10-CM | POA: Diagnosis not present

## 2023-05-28 LAB — BASIC METABOLIC PANEL
Anion gap: 5 (ref 5–15)
BUN: 5 mg/dL — ABNORMAL LOW (ref 8–23)
CO2: 27 mmol/L (ref 22–32)
Calcium: 7.7 mg/dL — ABNORMAL LOW (ref 8.9–10.3)
Chloride: 106 mmol/L (ref 98–111)
Creatinine, Ser: 0.52 mg/dL — ABNORMAL LOW (ref 0.61–1.24)
GFR, Estimated: >60 mL/min (ref >60)
Glucose, Bld: 114 mg/dL — ABNORMAL HIGH (ref 70–99)
Potassium: 4.2 mmol/L (ref 3.5–5.1)
Sodium: 138 mmol/L (ref 135–145)

## 2023-05-28 LAB — GLUCOSE, CAPILLARY
Glucose-Capillary: 103 mg/dL — ABNORMAL HIGH (ref 70–99)
Glucose-Capillary: 306 mg/dL — ABNORMAL HIGH (ref 70–99)
Glucose-Capillary: 338 mg/dL — ABNORMAL HIGH (ref 70–99)

## 2023-05-28 LAB — CBC
HCT: 30.5 % — ABNORMAL LOW (ref 39.0–52.0)
Hemoglobin: 8.9 g/dL — ABNORMAL LOW (ref 13.0–17.0)
MCH: 29.7 pg (ref 26.0–34.0)
MCHC: 29.2 g/dL — ABNORMAL LOW (ref 30.0–36.0)
MCV: 101.7 fL — ABNORMAL HIGH (ref 80.0–100.0)
Platelets: 143 10*3/uL — ABNORMAL LOW (ref 150–400)
RBC: 3 MIL/uL — ABNORMAL LOW (ref 4.22–5.81)
RDW: 15.9 % — ABNORMAL HIGH (ref 11.5–15.5)
WBC: 4.9 10*3/uL (ref 4.0–10.5)
nRBC: 0 % (ref 0.0–0.2)

## 2023-05-28 LAB — MAGNESIUM: Magnesium: 1.9 mg/dL (ref 1.7–2.4)

## 2023-05-28 LAB — PHOSPHORUS: Phosphorus: 2.6 mg/dL (ref 2.5–4.6)

## 2023-05-28 MED ORDER — IPRATROPIUM-ALBUTEROL 0.5-2.5 (3) MG/3ML IN SOLN: 3.0000 mL | Freq: Four times a day (QID) | RESPIRATORY_TRACT | Status: DC | PRN

## 2023-05-28 MED ORDER — DEXTROSE 10 % IV SOLN: INTRAVENOUS | Status: AC

## 2023-05-28 NOTE — Plan of Care (Signed)

## 2023-05-28 NOTE — Progress Notes (Signed)
PROGRESS NOTE  Jim Ramirez  ZOX:[id number] DOB: 1950/12/15 DOA: 05/22/2023 PCP: [name], MD   Chief Complaint  Patient presents with   Hypotension   Level of care: Stepdown  Brief Admission History:  72 y.o. 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  Patient was seen in the cardiology office prior to arrival where his blood pressure was reportedly 70/40 with dizziness and fatigue.  He was sent to the ER from there.  Patient reports he is actually here for abdominal pain.  He reports that he has abdominal pain that has been intermittent for 2 days.  It is unpredictable with no identified provoking or relieving factors.  He describes it as a crampy pain.  He has taken Pepto, Tylenol, Advil at home with no relief.  He reports whenever he stops his PPI he has "trouble."  He reports he was recently taken off of his Nexium for unknown reasons.  He was given Bentyl at admission that did not help.  Morphine seems to have helped.  He denies nausea, vomiting, diarrhea.  He reports constipation but then reports his last bowel movement was normal and it was 1 or 2 days ago.  He has had no fever.  As far as his hypotension goes he complains of dizziness and fatigue.  He reports he comes here from [facility] rehab where he is mostly nonambulatory.  He reports the dizziness is when he sits up.  He denies chest pain, but reports palpitations.  The palpitations have been present for 2 weeks.  He also cannot identify anything that provokes those.  He reports a decreased appetite.  He eats about half a meal twice per day.  He has decreased liquid p.o. intake as well.  On review  of systems patient does report he has had 2-3 months of peripheral edema.  Patient has no other complaints at this time.   He does not smoke and does not drink.  Patient reports he would want to be full code.  ACP documents were reviewed which specify a decision maker but no limitations.  - 72 y.o. 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 Lovell Sheehan on 01/20/2023 , as well as a diagnosis of left lower extremity DVT status post IVC filter placement on 01/23/2023 admitted on 02/22/2023 with persistent hypokalemia possible UTI and acute metabolic encephalopathy - --At discharge please restart  p.o.cefadroxil 1000 mg bid for 10 (Ten) Months for MSSA osteomyelitis/epidural abscess suppression   Assessment and Plan:  1)Acute Pancreatitis - resolving  - he was treated with supportive measures with IV fluid and pain management - -Tolerating soft diet at this time - lipase trending down, he is clinically improving with no abdominal pain - he appears generally unwell and prognosis guarded - appreciate GI team consult and recommendations - requested for goals of care discussion with palliative care team, also has very low albumin which is a poor prognostic indicator in addition to recurrent  severe hypoglycemia from poor liver glycogen reserve -GI service recommends outpatient CT pancreatic protocol in 4 to 6 weeks -GI service also recommends outpatient GI follow-up in 2 to 3 weeks -Continue IV Protonix twice daily  2)SIRS (systemic inflammatory response syndrome) - resolved  - Patient sent in for hypotension, he also had hypothermia - No leukocytosis - Initial lactic  acid 2.0 but then 1.1 after a fluid bolus - Chest x-ray shows some atelectasis but no definite infection - Blood cultures Negative - UA is not indicative of UTI - Patient is under the treatment of infectious disease and currently getting Duricef - Continue Duricef, which is planned to be continued for at least 10 months following hospitalization for "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 Lovell Sheehan on 01/20/2023  --He completed IV Ancef on 02/18/2023 and was transitioned to p.o.cefadroxil 1000 mg bid -c/n Cefadroxil 1000 mg --bid for at least 10 months (Ten Months)-- from 02/18/23 as per ID---on hold from 02/24/2023 as patient is being started on cefepime for UTI -Low threshold for repeat lumbar imaging -At discharge please restart  p.o.cefadroxil 1000 mg bid for 10 (Ten) Months for MSSA osteomyelitis/epidural abscess suppression - Patient has been following with infectious disease outpatient -Repeat blood cultures pending -Continue Duricef, which is planned to be continued for at least 10 months following hospitalization for "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 Lovell Sheehan on 01/20/2023 "   10)Goals of care -- long conversation with patient and with his sister about poor prognosis and need for him to reconsider full code status as he likely would suffer more injury and painful suffering due to his severe deconditioned state   11) chronic anemia--- Hgb currently above 8, stable  DVT prophylaxis: apixaban  Code Status: Full  Family Communication:  Dr. Laural Benes spoke with sister 05/24/23 discussing goals of care and poor prognosis, she verbalized understanding; sister planning in person visit  today after church Disposition: likely to return to his current SNF  Consultants:  GI  Palliative care   Procedures:   Antimicrobials:    Subjective: 1 non-bloody BM today No fever  Or chills   Objective: Vitals:   05/28/23 1100 05/28/23 1242 05/28/23 1243 05/28/23 1450  BP: 120/61  (!) 119/50   Pulse: 69 71 68   Resp: 18 (!) 21 16   Temp:      TempSrc:      SpO2: 95% 99% 100% 98%  Weight:      Height:         Intake/Output Summary (Last 24 hours) at 05/28/2023 1537 Last data filed at 05/28/2023 0800 Gross per 24 hour  Intake 480 ml  Output --  Net 480 ml   Filed Weights   05/24/23 0420 05/26/23 0431 05/27/23 0213  Weight: 79.6 kg 81.2 kg 77.4 kg   Examination:  General exam: cachectic, awake, alert, oriented x 3, pale, frail appearing, chronically ill appearing male; Appears Uncomfortable, and he appears very ill;  Respiratory system: no increased work of breathing.  Cardiovascular system: normal S1 & S2 heard. No JVD, murmurs, rubs, gallops or clicks. 1+ pedal edema. Gastrointestinal system: Abdomen is soft and nontender.  +BS Central nervous system: Alert and oriented. No focal neurological deficits. Extremities: Symmetric 5 x 5 power. Skin: No rashes, lesions or ulcers. Psychiatry: Judgement and insight UTD. Mood & affect anxious.   Data Reviewed: I have personally reviewed following labs and imaging studies  CBC: Recent Labs  Lab 05/22/23 1440 05/23/23 0447 05/25/23 0348 05/26/23 0516 05/26/23 1552 05/27/23 0512 05/28/23 0541  WBC 6.3 5.4 5.3 4.7 4.9 5.6 4.9  NEUTROABS 4.4 3.3  --   --   --   --   --   HGB 10.1* 8.7* 8.1* 8.2* 9.0* 8.6* 8.9*  HCT 31.8* 27.4* 25.6* 26.6* 28.4* 28.0* 30.5*  MCV 94.6 94.5 95.5 97.1 96.6 95.6 101.7*  PLT 203 172 180 164 180 183 143*    Basic Metabolic Panel: Recent Labs  Lab 05/24/23 0416 05/25/23 0348 05/26/23 0516 05/27/23 0512 05/28/23 0541  NA 139 140 136 137 138  K 3.1* 3.4* 3.4* 3.7 4.2  CL 108 108 104 104 106  CO2 25 28 28 29 27   GLUCOSE 193* 118* 115* 112* 114*  BUN 7* 5* <5* <5* 5*  CREATININE 0.52* 0.41* 0.41* 0.43* 0.52*  CALCIUM 7.3* 7.3* 7.3* 7.5* 7.7*  MG 1.7 2.2 1.8 1.9 1.9  PHOS  --   --  2.6  --  2.6    CBG: Recent Labs  Lab 05/27/23 0356 05/27/23 0731 05/27/23 1212 05/27/23 2012 05/28/23 0750  GLUCAP 115* 99 362* 135* 103*    Recent Results (from the past 240 hour(s))  Culture, blood  (routine x 2)     Status: None   Collection Time: 05/22/23  2:45 PM   Specimen: BLOOD  Result Value Ref Range Status   Specimen Description BLOOD RIGHT ASSIST CONTROL  Final   Special Requests   Final    BOTTLES DRAWN AEROBIC AND ANAEROBIC Blood Culture adequate volume   Culture   Final    NO GROWTH 5 DAYS Performed at [facility], [address] [place], [place] [address]    Report Status 05/27/2023 FINAL  Final  Culture, blood (routine x 2)     Status: None   Collection Time: 05/22/23  3:18 PM   Specimen: BLOOD  Result Value Ref Range Status   Specimen  Description BLOOD BLOOD LEFT HAND AEROBIC BOTTLE ONLY  Final   Special Requests   Final    BOTTLES DRAWN AEROBIC ONLY Blood Culture adequate volume   Culture   Final    NO GROWTH 5 DAYS Performed at [facility], [address], [place], [place] [address]    Report Status 05/27/2023 FINAL  Final  MRSA Next Gen by PCR, Nasal     Status: None   Collection Time: 05/22/23 10:10 PM   Specimen: Nasal Mucosa; Nasal Swab  Result Value Ref Range Status   MRSA by PCR Next Gen NOT DETECTED NOT DETECTED Final    Comment: (NOTE) The GeneXpert MRSA Assay (FDA approved for NASAL specimens only), is one component of a comprehensive MRSA colonization surveillance program. It is not intended to diagnose MRSA infection nor to guide or monitor treatment for MRSA infections. Test performance is not FDA approved in patients less than 75 years old. Performed at [facility], [address] [place], [place] [address]      Radiology Studies: No results found.  Scheduled Meds:  cefadroxil  1,000 mg Oral BID   Chlorhexidine Gluconate Cloth  6 each Topical Daily   feeding supplement  237 mL Oral TID BM   ipratropium-albuterol  3 mL Nebulization TID   melatonin  3 mg Oral QHS   metoprolol tartrate  25 mg Oral BID   multivitamin with minerals  1 tablet Oral Daily   pantoprazole (PROTONIX) IV  40 mg Intravenous Q12H   potassium chloride  20 mEq  Oral BID   Continuous Infusions:  dextrose 41 mL/hr at 05/28/23 0919     LOS: 6 days    Shon Hale, MD   05/28/2023, 3:37 PM

## 2023-05-28 NOTE — Progress Notes (Signed)
Patient declined CPAP tonight. He didn't wear over 1-2 hours last night. Unit still at bedside if he changes his mind.

## 2023-05-28 NOTE — Progress Notes (Signed)
   Subjective: Feeling better this morning. Very mild abdominal pain but no nausea or vomiting. Was able to tolerate breakfast, stating he ate a large amount. Reports 1 BM this morning.   Objective: Vital signs in last 24 hours: Temp:  [97.7 F (36.5 C)-98.4 F (36.9 C)] 97.9 F (36.6 C) (10/30 0810) Pulse Rate:  [53-77] 63 (10/30 0400) Resp:  [11-21] 16 (10/30 0400) BP: (89-163)/(41-89) 117/48 (10/30 0400) SpO2:  [95 %-100 %] 98 % (10/30 0400) FiO2 (%):  [21 %] 21 % (10/29 2228) Last BM Date : 05/27/23 General:   Alert and oriented, pleasant Head:  Normocephalic and atraumatic. Eyes:  No icterus, sclera clear. Conjuctiva pink.  Mouth:  Without lesions, mucosa pink and moist.  Heart:  S1, S2 present, no murmurs noted.  Lungs: Clear to auscultation bilaterally, without wheezing, rales, or rhonchi.  Abdomen:  Bowel sounds present, soft, non-tender, non-distended. No HSM or hernias noted. No rebound or guarding. No masses appreciated  Neurologic:  Alert and  oriented x4;  grossly normal neurologically. Psych:  Alert and cooperative. Normal mood and affect.  Intake/Output from previous day: 10/29 0701 - 10/30 0700 In: 450 [P.O.:450] Out: -  Intake/Output this shift: No intake/output data recorded.  Lab Results: Recent Labs    05/26/23 1552 05/27/23 0512 05/28/23 0541  WBC 4.9 5.6 4.9  HGB 9.0* 8.6* 8.9*  HCT 28.4* 28.0* 30.5*  PLT 180 183 143*   BMET Recent Labs    05/26/23 0516 05/27/23 0512 05/28/23 0541  NA 136 137 138  K 3.4* 3.7 4.2  CL 104 104 106  CO2 28 29 27   GLUCOSE 115* 112* 114*  BUN <5* <5* 5*  CREATININE 0.41* 0.43* 0.52*  CALCIUM 7.3* 7.5* 7.7*   LFT Recent Labs    05/26/23 0516  PROT 4.0*  ALBUMIN <1.5*  AST 15  ALT 13  ALKPHOS 71  BILITOT 0.5    Assessment: Jim Ramirez is a 72 y.o. male with multiple medical issues now inpatient with acute pancreatitis felt idiopathic in light of negative evaluation thus far (no gallstones,  normal triglycerides, no hypercalcemia, no ETOH/tobacco, normal CA 19-9, IgG 4-- 49).   Patient appears to be clinically improving with very mild abdominal pain this morning. Denies nausea or vomiting.  Tolerating small amounts of liquid diet therefore was advanced to soft diet yesterday which he did well with. Denies any worsening of abdominal pain after eating and abdominal exam is unremarkable.   Also with findings of gastritis and duodenitis on imaging which could be secondary to his pancreatitis.  There were reports of rectal bleeding's x 2 Monday and a black stool overnight Monday night as well. Hgb remaining fairly stable and Dr. Marletta Lor was able to see the stool himself which appeared mostly brown with a few spots of red blood. For now, we will continue IV PPI BID, monitor H/H, and for recurrent overt GI bleeding, and  hold off on endoscopic evaluation.    Plan: Soft diet PPI BID Monitor for overt GI bleeding Trend h&h GI follow up as outpatient in 2-3 weeks Outpatient CT pancreatic protocol in 4-6 weeks  GI will sign off   LOS: 6 days    05/28/2023, 8:46 AM   Tywanda Rice L. Jeanmarie Hubert, MSN, APRN, AGNP-C Adult-Gerontology Nurse Practitioner [facility]

## 2023-05-29 ENCOUNTER — Telehealth: Payer: Self-pay | Admitting: Gastroenterology

## 2023-05-29 DIAGNOSIS — R5381 Other malaise: Secondary | ICD-10-CM | POA: Diagnosis not present

## 2023-05-29 DIAGNOSIS — R6510 Systemic inflammatory response syndrome (SIRS) of non-infectious origin without acute organ dysfunction: Secondary | ICD-10-CM | POA: Diagnosis not present

## 2023-05-29 DIAGNOSIS — B9561 Methicillin susceptible Staphylococcus aureus infection as the cause of diseases classified elsewhere: Secondary | ICD-10-CM | POA: Diagnosis not present

## 2023-05-29 DIAGNOSIS — M6289 Other specified disorders of muscle: Secondary | ICD-10-CM | POA: Diagnosis not present

## 2023-05-29 DIAGNOSIS — I480 Paroxysmal atrial fibrillation: Secondary | ICD-10-CM | POA: Diagnosis not present

## 2023-05-29 DIAGNOSIS — R531 Weakness: Secondary | ICD-10-CM | POA: Diagnosis not present

## 2023-05-29 DIAGNOSIS — G9341 Metabolic encephalopathy: Secondary | ICD-10-CM | POA: Diagnosis not present

## 2023-05-29 DIAGNOSIS — B965 Pseudomonas (aeruginosa) (mallei) (pseudomallei) as the cause of diseases classified elsewhere: Secondary | ICD-10-CM | POA: Diagnosis not present

## 2023-05-29 DIAGNOSIS — Z743 Need for continuous supervision: Secondary | ICD-10-CM | POA: Diagnosis not present

## 2023-05-29 DIAGNOSIS — M4626 Osteomyelitis of vertebra, lumbar region: Secondary | ICD-10-CM | POA: Diagnosis not present

## 2023-05-29 DIAGNOSIS — E785 Hyperlipidemia, unspecified: Secondary | ICD-10-CM | POA: Diagnosis not present

## 2023-05-29 DIAGNOSIS — M6281 Muscle weakness (generalized): Secondary | ICD-10-CM | POA: Diagnosis not present

## 2023-05-29 DIAGNOSIS — Z7401 Bed confinement status: Secondary | ICD-10-CM | POA: Diagnosis not present

## 2023-05-29 DIAGNOSIS — K859 Acute pancreatitis without necrosis or infection, unspecified: Secondary | ICD-10-CM | POA: Diagnosis not present

## 2023-05-29 DIAGNOSIS — I82402 Acute embolism and thrombosis of unspecified deep veins of left lower extremity: Secondary | ICD-10-CM | POA: Diagnosis not present

## 2023-05-29 DIAGNOSIS — E119 Type 2 diabetes mellitus without complications: Secondary | ICD-10-CM | POA: Diagnosis not present

## 2023-05-29 LAB — GLUCOSE, CAPILLARY
Glucose-Capillary: 208 mg/dL — ABNORMAL HIGH (ref 70–99)
Glucose-Capillary: 182 mg/dL — ABNORMAL HIGH (ref 70–99)
Glucose-Capillary: 264 mg/dL — ABNORMAL HIGH (ref 70–99)
Glucose-Capillary: 205 mg/dL — ABNORMAL HIGH (ref 70–99)

## 2023-05-29 LAB — CBC
HCT: 27.5 % — ABNORMAL LOW (ref 39.0–52.0)
Hemoglobin: 8.2 g/dL — ABNORMAL LOW (ref 13.0–17.0)
MCH: 29.1 pg (ref 26.0–34.0)
MCHC: 29.8 g/dL — ABNORMAL LOW (ref 30.0–36.0)
MCV: 97.5 fL (ref 80.0–100.0)
Platelets: 156 10*3/uL (ref 150–400)
RBC: 2.82 MIL/uL — ABNORMAL LOW (ref 4.22–5.81)
RDW: 15.4 % (ref 11.5–15.5)
WBC: 5.1 10*3/uL (ref 4.0–10.5)
nRBC: 0 % (ref 0.0–0.2)

## 2023-05-29 LAB — CORTISOL-AM, BLOOD: Cortisol - AM: 7.8 ug/dL (ref 6.7–22.6)

## 2023-05-29 MED ORDER — ALBUTEROL SULFATE HFA 108 (90 BASE) MCG/ACT IN AERS: 2.0000 | INHALATION_SPRAY | Freq: Four times a day (QID) | RESPIRATORY_TRACT | 2 refills | Status: AC | PRN

## 2023-05-29 MED ORDER — ACETAMINOPHEN 325 MG PO TABS: 650.0000 mg | ORAL_TABLET | Freq: Four times a day (QID) | ORAL | Status: AC | PRN

## 2023-05-29 MED ORDER — CEFADROXIL 500 MG PO CAPS: 1000.0000 mg | ORAL_CAPSULE | Freq: Two times a day (BID) | ORAL | Status: DC

## 2023-05-29 MED ORDER — OXYCODONE HCL 5 MG PO TABS: 5.0000 mg | ORAL_TABLET | Freq: Four times a day (QID) | ORAL | 0 refills | Status: AC | PRN

## 2023-05-29 NOTE — Discharge Instructions (Addendum)
1)Continue Cefadroxil/Duricef  for "MSSA Osteomyelitis /Lumbar spine discitis/epidural abscess suppressive therapy"--through end of May  2025  2)Wound care--- clean vertebral column and L heel wounds with Vashe wound cleanser Hart [place] [phone]), then using Q tip applicator pack wounds with Vashe moistened gauze twice daily making sure to cover depth and any undermining.  Top with dry gauze and silicone foam to vertebral column. Top with dry gauze and Kerlix roll gauze to left heel  3)Repeat CBC and BMP blood test in 1 week from now  4)Gastroenterologist recommends--- outpatient CT pancreatic protocol in 4 to 6 weeks and  also recommends outpatient gastroenterology follow-up in 2 to 3 weeks

## 2023-05-29 NOTE — TOC Transition Note (Signed)
Transition of Care [facility]) - CM/SW Discharge Note   Patient Details  Name: Jim Ramirez MRN: [id number] Date of Birth: 1950-10-25  Transition of Care [facility]) CM/SW Contact:  Leitha Bleak, RN Phone Number: 05/29/2023, 1:48 PM   Clinical Narrative:   Patient medically ready to discharge back to [facility]. His sister is at the beside to explain. Revonda Standard provided room number and number for report. RN calling report. EMS is behind today. Patient added to EMS list for transport.     Final next level of care: Skilled Nursing Facility Barriers to Discharge: Barriers Resolved   Patient Goals and CMS Choice CMS Medicare.gov Compare Post Acute Care list provided to:: Patient Choice offered to / list presented to : Patient  Discharge Placement                Patient to be transferred to facility by: EMS Name of family member notified: sister at the bedside Patient and family notified of of transfer: 05/29/23  Discharge Plan and Services Additional resources added to the After Visit Summary for        [facility] Arranged: PT, RN   Date [facility] Agency Contacted: 05/23/23 Time HH Agency Contacted: 1540 Representative spoke with at [facility] Agency: Versie Starks  Social Determinants of Health (SDOH) Interventions SDOH Screenings   Food Insecurity: No Food Insecurity (05/22/2023)  Housing: Low Risk  (05/22/2023)  Transportation Needs: No Transportation Needs (05/22/2023)  Utilities: Not At Risk (05/22/2023)  Depression (PHQ2-9): Low Risk  (04/25/2023)  Financial Resource Strain: Low Risk  (05/01/2023)   Received from [facility]  Physical Activity: Inactive (05/01/2023)   Received from [facility]  Social Connections: Socially Isolated (05/01/2023)   Received from [facility]  Stress: No Stress Concern Present (05/01/2023)   Received from [facility]  Tobacco Use: Medium Risk (05/22/2023)  Health Literacy: Medium Risk (05/01/2023)   Received from [facility]     Readmission Risk Interventions    05/23/2023    3:41 PM  Readmission Risk Prevention Plan  Transportation Screening Complete  PCP or Specialist Appt within 5-7 Days Not Complete  Home Care Screening Complete  Medication Review (RN CM) Complete

## 2023-05-29 NOTE — Inpatient Diabetes Management (Signed)
Inpatient Diabetes Program Recommendations  AACE/ADA: New Consensus Statement on Inpatient Glycemic Control (2015)  Target Ranges:  Prepandial:   less than 140 mg/dL      Peak postprandial:   less than 180 mg/dL (1-2 hours)      Critically ill patients:  140 - 180 mg/dL   Lab Results  Component Value Date   GLUCAP 182 (H) 05/29/2023   HGBA1C 8.5 (H) 12/24/2022    Review of Glycemic Control  Diabetes history: DM2 Outpatient Diabetes medications: None Current orders for Inpatient glycemic control: CBGs only  Inpatient Diabetes Program Recommendations:   Please consider: -Novolog 0-9 units tid, 0-5 units hs correction  Thank you, Darel Hong E. Ardeth Repetto, RN, MSN, CDCES  Diabetes Coordinator Inpatient Glycemic Control Team Team Pager [phone] (8am-5pm) 05/29/2023 12:34 PM

## 2023-05-29 NOTE — Plan of Care (Signed)

## 2023-05-29 NOTE — Telephone Encounter (Signed)
Patient needs hospital follow up in 4-6 weeks with one of the following providers, Dr. Marletta Lor, Garlan Fillers, or [place], who saw him while hospitalized. Patient to be discharged today. He will need follow up pancreas imaging after next ov.

## 2023-05-29 NOTE — Care Management Important Message (Signed)
Important Message  Patient Details  Name: Jim Ramirez MRN: [id number] Date of Birth: August 22, 1950   Important Message Given:  Yes - Medicare IM     Corey Harold 05/29/2023, 2:37 PM

## 2023-05-29 NOTE — Discharge Summary (Signed)
Jim Ramirez, is a 72 y.o. male  DOB Sep 21, 1950  MRN [id number].  Admission date:  05/22/2023  Admitting Physician  Lilyan Gilford, DO  Discharge Date:  05/29/2023   Primary MD  [name], MD  Recommendations for primary care physician for things to follow:   1)Continue Cefadroxil/Duricef  for "MSSA Osteomyelitis /Lumbar spine discitis/epidural abscess suppressive therapy"--through end of May  2025  2)Wound care--- clean vertebral column and L heel wounds with Vashe wound cleanser Hart [place] [phone]), then using Q tip applicator pack wounds with Vashe moistened gauze twice daily making sure to cover depth and any undermining.  Top with dry gauze and silicone foam to vertebral column. Top with dry gauze and Kerlix roll gauze to left heel  3)Repeat CBC and BMP blood test in 1 week from now  4)Gastroenterologist recommends--- outpatient CT pancreatic protocol in 4 to 6 weeks and  also recommends outpatient gastroenterology follow-up in 2 to 3 weeks  Admission Diagnosis  SIRS (systemic inflammatory response syndrome) (HCC) [R65.10]  Discharge Diagnosis  SIRS (systemic inflammatory response syndrome) (HCC) [R65.10]   Principal Problem:   SIRS (systemic inflammatory response syndrome) (HCC) Active Problems:   Hypokalemia   Paroxysmal atrial fibrillation with RVR (HCC)   Abdominal pain   MSSA bacteremia   Hypotension   Pressure injury of heel, stage 3 (HCC)   Idiopathic acute pancreatitis without infection or necrosis   Nausea without vomiting      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   CTA negative for aneurysm   Depressed    Diabetes mellitus without complication (HCC)    DVT (deep venous thrombosis) (HCC)    History of kidney stones    Hypertension    Nephrolithiasis    Sleep apnea     Past Surgical History:   Procedure Laterality Date   ANTERIOR CERVICAL DECOMP/DISCECTOMY FUSION N/A 05/22/2022   Procedure: ANTERIOR CERVICAL DISCECTOMY FUSION, INTERBODY PROSTHESIS,PLATE/SCREWS CERVICAL THREE-FOUR, CERVICAL FOUR-FIVE;REMOVAL CERVICAL PLATE;  Surgeon: Tressie Stalker, MD;  Location: [facility] OR;  Service: Neurosurgery;  Laterality: N/A;  3C   CATARACT EXTRACTION W/PHACO Left 09/23/2022   Procedure: CATARACT EXTRACTION PHACO AND INTRAOCULAR LENS PLACEMENT (IOC);  Surgeon: Fabio Pierce, MD;  Location: [facility];  Service: Ophthalmology;  Laterality: Left;  CDE: 7.52   CATARACT EXTRACTION W/PHACO Right 10/07/2022   Procedure: CATARACT EXTRACTION PHACO AND INTRAOCULAR LENS PLACEMENT (IOC);  Surgeon: Fabio Pierce, MD;  Location: [facility];  Service: Ophthalmology;  Laterality: Right;  CDE: 9.40   IR IVC FILTER PLMT / S&I /IMG GUID/MOD SED  01/19/2023   LUMBAR LAMINECTOMY/DECOMPRESSION MICRODISCECTOMY N/A 01/20/2023   Procedure: LUMBAR LAM FOR EPIDURAL ABSCESS, LUMBAR THREE-FOUR, LUMBAR FOUR-FIVE,  LUMBAR FIVE-SACRAL ONE;  Surgeon: Tressie Stalker, MD;  Location: [facility] OR;  Service: Neurosurgery;  Laterality: N/A;   STOMACH SURGERY     TEE WITHOUT CARDIOVERSION N/A 12/31/2022   Procedure: TRANSESOPHAGEAL ECHOCARDIOGRAM;  Surgeon: Little Ishikawa, MD;  Location: [facility] INVASIVE CV LAB;  Service: Cardiovascular;  Laterality:  N/A;   TONSILLECTOMY  2000     HPI  from the history and physical done on the day of admission:    HPI: Jim Ramirez is a 72 y.o. 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  Patient was actually seen in the cardiology office today where his blood pressure was  reportedly 70/40 with dizziness and fatigue.  He was sent to the ER from there.  Patient reports he is actually here for abdominal pain.  He reports that he has abdominal pain that has been intermittent for 2 days.  It is unpredictable with no identified provoking or relieving factors.  He describes it as a crampy pain.  He has taken Pepto, Tylenol, Advil at home with no relief.  He reports whenever he stops his PPI he has "trouble."  He reports he was recently taken off of his Nexium for unknown reasons.  He was given Bentyl at admission that did not help.  Morphine seems to have helped.  He denies nausea, vomiting, diarrhea.  He reports constipation but then reports his last bowel movement was normal and it was 1 or 2 days ago.  He has had no fever.  As far as his hypotension goes he complains of dizziness and fatigue.  He reports he comes here from [facility] rehab where he is mostly nonambulatory.  He reports the dizziness is when he sits up.  He denies chest pain, but reports palpitations.  The palpitations have been present for 2 weeks.  He also cannot identify anything that provokes those.  He reports a decreased appetite.  He eats about half a meal twice per day.  He has decreased liquid p.o. intake as well.  On review of systems patient does report he has had 2-3 months of peripheral edema.  Patient has no other complaints at this time.   He does not smoke and does not drink.  Patient reports he would want to be full code.  ACP documents were reviewed which specify a decision maker but no limitations. Review of Systems: As mentioned in the history of present illness. All other systems reviewed and are negative.   Hospital Course:   1)Acute Pancreatitis -mostly resolved, tolerating oral solids intake well - he was treated with supportive measures with IV fluid and pain management - lipase trended down,  -No significant abdominal pain even with intake of oral solids  -No vomiting --GI service recommends  outpatient CT pancreatic protocol in 4 to 6 weeks -GI service also recommends outpatient GI follow-up in 2 to 3 weeks -Treated with IV Protonix okay to discharge on oral PPI   2)SIRS (systemic inflammatory response syndrome) -due to acute Pancreatitis --SIRS pathophysiology has resolved  - Patient sent in for hypotension, he also had hypothermia - No leukocytosis - Initial lactic acid 2.0 but then 1.1 after a fluid bolus - Chest x-ray shows some atelectasis but no definite infection - Blood cultures Negative - UA is not indicative of UTI   3)Pressure injury of heel, stage 3---POA Present at admission - Following with wound care outpatient - Wound consult recommendations noted  --C/n Heel Protectors bilateral   4)Hypotension - resolved  - Likely dehydration and poor nutrition contributed -BP is now stable  5)Malnutrition/Severe hypoalbuminemia/Hypokalemia -High-protein diet and supplements advised - discussed with family that this is a poor prognostic indicator   6)Paroxysmal atrial fibrillation with RVR  - Continue apixaban for stroke prophylaxis - c/n  Metoprolol for rate control   7)MSSA Epidural Abscess--- recent diagnosis of Lumbar spine discitis and osteomyelitis and epidural abscess with stenosis and staph aureus/MSSA bacteremia, with negative TEE , S/P   left L3-4, L4-5 and L5-S1 laminotomy/foraminotomies for drainage of epidural abscess and decompression of the thecal sac by Dr. Lovell Sheehan on 01/20/2023  --He completed IV Ancef on 02/18/2023 and was transitioned to p.o.cefadroxil 1000 mg bid -c/n Cefadroxil 1000 mg --bid for at least 10 months (Ten Months)-- from 02/18/23 as per ID---on hold from 02/24/2023 as patient is being started on cefepime for UTI -Continue Cefadroxil/Duricef  for "MSSA Osteomyelitis /Lumbar spine discitis/epidural abscess suppressive therapy"--through end of May  2025  8)Wound care--- clean vertebral column and L heel wounds with Vashe wound cleanser  Hart [place] [phone]), then using Q tip applicator pack wounds with Vashe moistened gauze twice daily making sure to cover depth and any undermining.  Top with dry gauze and silicone foam to vertebral column. Top with dry gauze and Kerlix roll gauze to left heel  9)Chronic Anemia--- Hgb currently above 8, stable, repeat CBC in 1 week  Discharge Condition: stable  Follow UP   Contact information for follow-up providers     [name], MD Follow up in 1 week(s).   Specialty: Internal Medicine Why: Repeat CBC and BMP in 1 week Contact information: [address] [place] [address] [phone]              Contact information for after-discharge care     Destination     [facility] .   Service: Skilled Nursing Contact information: [address] [place] [address] [phone]                     Consults obtained - Gi  Diet and Activity recommendation:  As advised  Discharge Instructions    Discharge Instructions     Call MD for:  difficulty breathing, headache or visual disturbances   Complete by: As directed    Call MD for:  persistant dizziness or light-headedness   Complete by: As directed    Call MD for:  persistant nausea and vomiting   Complete by: As directed    Call MD for:  temperature >100.4   Complete by: As directed    Diet general   Complete by: As directed    Discharge instructions   Complete by: As directed    1)Continue Cefadroxil/Duricef  for "MSSA Osteomyelitis /Lumbar spine discitis/epidural abscess suppressive therapy"--through end of May  2025  2)Wound care--- clean vertebral column and L heel wounds with Vashe wound cleanser Hart [place] [phone]), then using Q tip applicator pack wounds with Vashe moistened gauze twice daily making sure to cover depth and any undermining.  Top with dry gauze and silicone foam to vertebral column. Top with dry gauze and Kerlix roll gauze to left heel  3)Repeat  CBC and BMP blood test in 1 week from now  4)Gastroenterologist recommends--- outpatient CT pancreatic protocol in 4 to 6 weeks and  also recommends outpatient gastroenterology follow-up in 2 to 3 weeks   Discharge wound care:   Complete by: As directed    Clean vertebral column and L heel wounds with Vashe wound cleanser Hart [place] [phone]), then using Q tip applicator pack wounds  with Vashe moistened gauze twice daily making sure to cover depth and any undermining.  Top with dry gauze and silicone foam to vertebral column. Top with dry gauze and Kerlix roll gauze to left heel   Increase activity slowly   Complete by: As directed        Discharge Medications     Allergies as of 05/29/2023   No Known Allergies      Medication List     TAKE these medications    acetaminophen 325 MG tablet Commonly known as: TYLENOL Take 2 tablets (650 mg total) by mouth every 6 (six) hours as needed for mild pain (pain score 1-3) (or Fever >/= 101).   albuterol 108 (90 Base) MCG/ACT inhaler Commonly known as: VENTOLIN HFA Inhale 2 puffs into the lungs every 6 (six) hours as needed for wheezing or shortness of breath.   apixaban 5 MG Tabs tablet Commonly known as: ELIQUIS Take 1 tablet (5 mg total) by mouth 2 (two) times daily.   ascorbic acid 500 MG tablet Commonly known as: VITAMIN C Take 500 mg by mouth daily.   cefadroxil 500 MG capsule Commonly known as: DURICEF Take 2 capsules (1,000 mg total) by mouth 2 (two) times daily. Continue for long term thru end of May 2025 What changed: additional instructions   clotrimazole-betamethasone cream Commonly known as: LOTRISONE Apply 1 Application topically 2 (two) times daily. Apply to sacrum and groin topically for wound care   esomeprazole 40 MG capsule Commonly known as: NEXIUM Take 40 mg by mouth daily.   melatonin 3 MG Tabs tablet Take 3 mg by mouth at bedtime.   metoprolol tartrate 25 MG tablet Commonly known as: LOPRESSOR Take  0.5 tablets (12.5 mg total) by mouth 2 (two) times daily.   oxyCODONE 5 MG immediate release tablet Commonly known as: Oxy IR/ROXICODONE Take 1 tablet (5 mg total) by mouth every 6 (six) hours as needed for moderate pain (pain score 4-6) or severe pain (pain score 7-10).   ProMod Liqd Take 30 mLs by mouth in the morning and at bedtime. For wound healing   tamsulosin 0.4 MG Caps capsule Commonly known as: FLOMAX Take 0.4 mg by mouth daily.   Zinc 50 MG Tabs Take 50 mg by mouth daily.               Discharge Care Instructions  (From admission, onward)           Start     Ordered   05/29/23 0000  Discharge wound care:       Comments: Clean vertebral column and L heel wounds with Vashe wound cleanser Hart [place] [phone]), then using Q tip applicator pack wounds with Vashe moistened gauze twice daily making sure to cover depth and any undermining.  Top with dry gauze and silicone foam to vertebral column. Top with dry gauze and Kerlix roll gauze to left heel   05/29/23 1351           Major procedures and Radiology Reports - PLEASE review detailed and final reports for all details, in brief -   DG CHEST PORT 1 VIEW  Result Date: 05/24/2023 CLINICAL DATA:  Shortness of breath EXAM: PORTABLE CHEST - 1 VIEW COMPARISON:  05/22/2023 FINDINGS: Persistent elevation of the left diaphragmatic leaflet. Worsening consolidation/atelectasis at the left lung base with suspected effusion. Right lung clear. Heart size and mediastinal contours are within normal limits. Cervical spine fixation hardware. IMPRESSION: Worsening left basilar consolidation/atelectasis with suspected effusion. Electronically Signed  By: Corlis Leak M.D.   On: 05/24/2023 12:37   US Abdomen Limited RUQ (LIVER/GB)  Result Date: 05/23/2023 CLINICAL DATA:  Acute pancreatitis. EXAM: ULTRASOUND ABDOMEN LIMITED RIGHT UPPER QUADRANT COMPARISON:  CT 05/22/2023 FINDINGS: Gallbladder: No gallstones or wall thickening  visualized. No sonographic Murphy sign noted by sonographer. Common bile duct: Diameter: 4 mm Liver: No focal lesion identified. Within normal limits in parenchymal echogenicity. Portal vein is patent on color Doppler imaging with normal direction of blood flow towards the liver. Other: Scattered ascites.  Right-sided pleural effusion. IMPRESSION: No gallstones or ductal dilatation. Ascites. Right pleural effusion. Electronically Signed   By: Karen Kays M.D.   On: 05/23/2023 14:11   CT ABDOMEN PELVIS WO CONTRAST  Result Date: 05/22/2023 CLINICAL DATA:  Acute nonlocalized abdominal pain. EXAM: CT ABDOMEN AND PELVIS WITHOUT CONTRAST TECHNIQUE: Multidetector CT imaging of the abdomen and pelvis was performed following the standard protocol without IV contrast. RADIATION DOSE REDUCTION: This exam was performed according to the departmental dose-optimization program which includes automated exposure control, adjustment of the mA and/or kV according to patient size and/or use of iterative reconstruction technique. COMPARISON:  CT chest abdomen and pelvis 12/22/2022 FINDINGS: Lower chest: Small bilateral pleural effusions with basilar atelectasis or consolidation, greater on the left. This could represent compressive atelectasis or pneumonia. Elevation of the left hemidiaphragm. Hepatobiliary: No focal liver abnormality is seen. No gallstones, gallbladder wall thickening, or biliary dilatation. Pancreas: The pancreas is diffusely enlarged with edema around the pancreas and throughout the upper abdomen. Appearances are likely to represent acute pancreatitis. No loculated collections are identified. Spleen: Normal in size without focal abnormality. Adrenals/Urinary Tract: No adrenal gland nodules. Kidneys are symmetrical. Bilateral intrarenal stones. Largest on the left measures 4 mm diameter. No hydronephrosis or hydroureter. Bladder wall is diffusely thickened, likely cystitis or outlet obstruction. Correlate with  urinalysis. Stomach/Bowel: Stomach is decompressed. The gastric wall is diffusely thickened. This could be due to under distention or may indicate gastritis. Small bowel are decompressed. There is wall thickening of the retroperitoneal portion of the duodenal, likely reactive inflammation. Stool-filled colon without abnormal distention. Appendix is not identified. Vascular/Lymphatic: Calcification of the abdominal aorta. No aneurysm. IVC filter. No significant lymphadenopathy. Reproductive: Prostate is unremarkable. Other: Moderate free fluid in the abdomen and pelvis, likely ascites. No free air. Prominent diffuse soft tissue edema in the subcutaneous fat. Musculoskeletal: Degenerative changes in the spine and hips. Areas of lucency and sclerosis in the L4, L5, and S1 vertebrae. This is likely degenerative but could indicate metastatic disease in the appropriate clinical setting. Curvilinear sclerosis in the femoral head suggest avascular necrosis. IMPRESSION: 1. Diffuse enlargement of the pancreas with peripancreatic edema likely representing acute pancreatitis. Wall thickening of the duodenal is likely reactive. No loculated collections. 2. Diffuse gastric wall thickening may be due to under distention or gastritis. 3. Small bilateral pleural effusions with basilar atelectasis or consolidation, greater on the left. 4. Diffuse bladder wall thickening, possibly under outlet obstruction or cystitis. Correlate with urinalysis. 5. Moderate ascites. 6. Aortic atherosclerosis. 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 Electronically Signed   By: Burman Nieves M.D.   On: 05/22/2023 23:51   DG Chest Port 1 View  Result Date: 05/22/2023 CLINICAL DATA:  Weakness. EXAM: PORTABLE CHEST 1 VIEW COMPARISON:  05/01/2023, chest CT 12/23/2023 FINDINGS: Stable heart size and mediastinal contours. Left basilar opacity is again seen, elevated hemidiaphragm and atelectasis on prior CT. There may be a small left pleural effusion.  Streaky right infrahilar  atelectasis. No pulmonary edema. No pneumothorax. IMPRESSION: 1. Left basilar opacity is in part related to elevated hemidiaphragm and atelectasis when compared prior CT. There may be a small left pleural effusion. 2. Streaky right infrahilar atelectasis. Electronically Signed   By: Narda Rutherford M.D.   On: 05/22/2023 16:27    Micro Results   Recent Results (from the past 240 hour(s))  Culture, blood (routine x 2)     Status: None   Collection Time: 05/22/23  2:45 PM   Specimen: BLOOD  Result Value Ref Range Status   Specimen Description BLOOD RIGHT ASSIST CONTROL  Final   Special Requests   Final    BOTTLES DRAWN AEROBIC AND ANAEROBIC Blood Culture adequate volume   Culture   Final    NO GROWTH 5 DAYS Performed at [facility], [address], [place], [place] [address]    Report Status 05/27/2023 FINAL  Final  Culture, blood (routine x 2)     Status: None   Collection Time: 05/22/23  3:18 PM   Specimen: BLOOD  Result Value Ref Range Status   Specimen Description BLOOD BLOOD LEFT HAND AEROBIC BOTTLE ONLY  Final   Special Requests   Final    BOTTLES DRAWN AEROBIC ONLY Blood Culture adequate volume   Culture   Final    NO GROWTH 5 DAYS Performed at [facility], [address] [place], [place] [address]    Report Status 05/27/2023 FINAL  Final  MRSA Next Gen by PCR, Nasal     Status: None   Collection Time: 05/22/23 10:10 PM   Specimen: Nasal Mucosa; Nasal Swab  Result Value Ref Range Status   MRSA by PCR Next Gen NOT DETECTED NOT DETECTED Final    Comment: (NOTE) The GeneXpert MRSA Assay (FDA approved for NASAL specimens only), is one component of a comprehensive MRSA colonization surveillance program. It is not intended to diagnose MRSA infection nor to guide or monitor treatment for MRSA infections. Test performance is not FDA approved in patients less than 64 years old. Performed at [facility], [address] [place], [place] [address]     Today   Subjective   Jim Ramirez today has no new complaints  - No fever  Or chills  - Tolerating solid intake well, no emesis -Hypoxia resolved O2 sats currently 99 to 100% on room air          Patient has been seen and examined prior to discharge   Objective   Blood pressure (!) 116/58, pulse 98, temperature 97.9 F (36.6 C), temperature source Oral, resp. rate 12, height 6' (1.829 m), weight 77.4 kg, SpO2 100%.   Intake/Output Summary (Last 24 hours) at 05/29/2023 1431 Last data filed at 05/29/2023 0828 Gross per 24 hour  Intake 923.43 ml  Output 2275 ml  Net -1351.57 ml   Exam Gen:- Awake Alert, no acute distress, frail and chronically ill-appearing HEENT:- [place].AT, No sclera icterus Neck-Supple Neck,No JVD,.  Lungs-  CTAB , good air movement bilaterally CV- S1, S2 normal, regular Abd-  +ve B.Sounds, Abd Soft, No tenderness,    Extremity/Skin:- No  edema,   good pulses Psych-affect is appropriate, oriented x3 Neuro-no new focal deficits, no tremors    Data Review   CBC w Diff:  Lab Results  Component Value Date   WBC 5.1 05/29/2023   HGB 8.2 (L) 05/29/2023   HCT 27.5 (L) 05/29/2023   PLT 156 05/29/2023   LYMPHOPCT 31 05/23/2023   MONOPCT 7 05/23/2023  EOSPCT 0 05/23/2023   BASOPCT 0 05/23/2023   CMP:  Lab Results  Component Value Date   NA 138 05/28/2023   K 4.2 05/28/2023   CL 106 05/28/2023   CO2 27 05/28/2023   BUN 5 (L) 05/28/2023   CREATININE 0.52 (L) 05/28/2023   PROT 4.0 (L) 05/26/2023   ALBUMIN <1.5 (L) 05/26/2023   BILITOT 0.5 05/26/2023   ALKPHOS 71 05/26/2023   AST 15 05/26/2023   ALT 13 05/26/2023   Total Discharge time is about 33 minutes  Shon Hale M.D on 05/29/2023 at 2:31 PM  Go to [email or web address] -  for contact info  [facility] - Office  [phone]

## 2023-05-30 ENCOUNTER — Encounter: Payer: Self-pay | Admitting: Gastroenterology

## 2023-05-30 NOTE — Consult Note (Signed)
[facility] Liaison Note  05/30/2023  JERELL DEMERY 1951/06/29 [id number]  Location: RN Hospital Liaison screened the patient remotely at [facility].  Insurance: Medicare   LAWERENCE DERY is a 72 y.o. male who is a Primary Care Patient of [name], MD-[facility]. The patient was screened for  readmission hospitalization with noted high risk score for unplanned readmission risk with 3 IP in 6 months.  The patient was assessed for potential Care Management service needs for post hospital transition for care coordination. Review of patient's electronic medical record reveals patient was admitted for SIRS-Systemic inflammatory response syndrome. Pt discharged to [facility] rehab for SNF level of care. Will collaborate with PAC-RN at the SNF facility for possible VBCI services upon his discharge.   VBCI Care Management/Population Health does not replace or interfere with any arrangements made by the Inpatient Transition of Care team.   For questions contact:   Elliot Cousin, RN, [facility] Liaison [place]   [facility], Population Health Office Hours MTWF  8:00 am-6:00 pm Direct Dial: [phone] mobile [phone] [Office toll free line] Office Hours are M-F 8:30 - 5 pm [email or web address]

## 2023-05-30 NOTE — Telephone Encounter (Signed)
Called but no answer, voice mail was full - will mail a letter to the patient about an appointment

## 2023-06-23 ENCOUNTER — Telehealth: Payer: Self-pay

## 2023-06-23 NOTE — Telephone Encounter (Signed)
Received e-mail from [facility] that they have not received monitor back from patient. Patient says he has an apt on 06/30/23 with GI and he will stop by office to get another box to mail it back in.

## 2023-06-28 NOTE — Progress Notes (Unsigned)
Referring Provider: [name], MD Primary Care Physician:  [name], MD Primary GI Physician: Dr. Marletta Lor  No chief complaint on file.   HPI:   Jim Ramirez is a 72 y.o. male presenting today for hospital follow-up of acute pancreatitis.  Patient was admitted in the latter part of October with acute pancreatitis, felt to be idiopathic in light of negative evaluation (no gallstones, normal triglycerides, no hypercalcemia, no ETOH/tobacco, normal CA 19-9 and IgG 4 ).  On CT, he was also found to have gastritis and duodenitis which was felt to be secondary to pancreatitis.  He did have very small amount rectal bleeding and had reported some black stool during his admission.  Dr. Marletta Lor stopped the stool himself and noted that it appeared mostly brown with a few specks of red blood.  Recommended continuing PPI twice daily, monitoring H&H and for recurrent overt GI bleeding, hold off on endoscopic evaluation.  He was treated supportively with clinical improvement.  Recommended outpatient follow-up, CT pancreatic protocol in 4 to 6 weeks.  Today:   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   CTA negative for aneurysm   Depressed    Diabetes mellitus without complication (HCC)    DVT (deep venous thrombosis) (HCC)    History of kidney stones    Hypertension    Nephrolithiasis    Sleep apnea     Past Surgical History:  Procedure Laterality Date   ANTERIOR CERVICAL DECOMP/DISCECTOMY FUSION N/A 05/22/2022   Procedure: ANTERIOR CERVICAL DISCECTOMY FUSION, INTERBODY PROSTHESIS,PLATE/SCREWS CERVICAL THREE-FOUR, CERVICAL FOUR-FIVE;REMOVAL CERVICAL PLATE;  Surgeon: Tressie Stalker, MD;  Location: [facility] OR;  Service: Neurosurgery;  Laterality: N/A;  3C   CATARACT EXTRACTION W/PHACO Left 09/23/2022   Procedure: CATARACT EXTRACTION PHACO AND INTRAOCULAR LENS PLACEMENT (IOC);  Surgeon: Fabio Pierce, MD;  Location: [facility] ORS;   Service: Ophthalmology;  Laterality: Left;  CDE: 7.52   CATARACT EXTRACTION W/PHACO Right 10/07/2022   Procedure: CATARACT EXTRACTION PHACO AND INTRAOCULAR LENS PLACEMENT (IOC);  Surgeon: Fabio Pierce, MD;  Location: [facility] ORS;  Service: Ophthalmology;  Laterality: Right;  CDE: 9.40   IR IVC FILTER PLMT / S&I /IMG GUID/MOD SED  01/19/2023   LUMBAR LAMINECTOMY/DECOMPRESSION MICRODISCECTOMY N/A 01/20/2023   Procedure: LUMBAR LAM FOR EPIDURAL ABSCESS, LUMBAR THREE-FOUR, LUMBAR FOUR-FIVE,  LUMBAR FIVE-SACRAL ONE;  Surgeon: Tressie Stalker, MD;  Location: [facility] OR;  Service: Neurosurgery;  Laterality: N/A;   STOMACH SURGERY     TEE WITHOUT CARDIOVERSION N/A 12/31/2022   Procedure: TRANSESOPHAGEAL ECHOCARDIOGRAM;  Surgeon: Little Ishikawa, MD;  Location: [facility] INVASIVE CV LAB;  Service: Cardiovascular;  Laterality: N/A;   TONSILLECTOMY  2000    Current Outpatient Medications  Medication Sig Dispense Refill   acetaminophen (TYLENOL) 325 MG tablet Take 2 tablets (650 mg total) by mouth every 6 (six) hours as needed for mild pain (pain score 1-3) (or Fever >/= 101).     albuterol (VENTOLIN HFA) 108 (90 Base) MCG/ACT inhaler Inhale 2 puffs into the lungs every 6 (six) hours as needed for wheezing or shortness of breath. 18 g 2   apixaban (ELIQUIS) 5 MG TABS tablet Take 1 tablet (5 mg total) by mouth 2 (two) times daily. 60 tablet 11   ascorbic acid (VITAMIN C) 500 MG tablet Take 500 mg by mouth daily.     cefadroxil (DURICEF) 500 MG capsule Take 2 capsules (1,000 mg total) by mouth 2 (two) times daily. Continue for long term thru  end of May 2025     clotrimazole-betamethasone (LOTRISONE) cream Apply 1 Application topically 2 (two) times daily. Apply to sacrum and groin topically for wound care     esomeprazole (NEXIUM) 40 MG capsule Take 40 mg by mouth daily.     melatonin 3 MG TABS tablet Take 3 mg by mouth at bedtime.     metoprolol tartrate (LOPRESSOR) 25 MG tablet Take 0.5 tablets (12.5 mg total) by  mouth 2 (two) times daily.     Nutritional Supplements (PROMOD) LIQD Take 30 mLs by mouth in the morning and at bedtime. For wound healing     oxyCODONE (OXY IR/ROXICODONE) 5 MG immediate release tablet Take 1 tablet (5 mg total) by mouth every 6 (six) hours as needed for moderate pain (pain score 4-6) or severe pain (pain score 7-10). 12 tablet 0   tamsulosin (FLOMAX) 0.4 MG CAPS capsule Take 0.4 mg by mouth daily.     Zinc 50 MG TABS Take 50 mg by mouth daily.     No current facility-administered medications for this visit.    Allergies as of 06/30/2023   (No Known Allergies)    Family History  Problem Relation Age of Onset   Heart attack Mother    Heart attack Father    Hypertension Brother    Diabetes Brother    Coronary artery disease Brother     Social History   Socioeconomic History   Marital status: Significant Other    Spouse name: Not on file   Number of children: 2   Years of education: Not on file   Highest education level: Not on file  Occupational History   Not on file  Tobacco Use   Smoking status: Former    Current packs/day: 0.00    Types: Cigarettes    Quit date: 1994    Years since quitting: 30.9    Passive exposure: Never   Smokeless tobacco: Never  Vaping Use   Vaping status: Never Used  Substance and Sexual Activity   Alcohol use: Not Currently   Drug use: Never   Sexual activity: Yes  Other Topics Concern   Not on file  Social History Narrative   Not on file   Social Determinants of Health   Financial Resource Strain: Low Risk  (05/01/2023)   Received from [facility]   Overall Financial Resource Strain (CARDIA)    Difficulty of Paying Living Expenses: Not hard at all  Food Insecurity: No Food Insecurity (05/22/2023)   Hunger Vital Sign    Worried About Running Out of Food in the Last Year: Never true    Ran Out of Food in the Last Year: Never true  Transportation Needs: No Transportation Needs (05/22/2023)   PRAPARE -  Administrator, Civil Service (Medical): No    Lack of Transportation (Non-Medical): No  Physical Activity: Inactive (05/01/2023)   Received from [facility]   Exercise Vital Sign    Days of Exercise per Week: 0 days    Minutes of Exercise per Session: 0 min  Stress: No Stress Concern Present (05/01/2023)   Received from [facility] of Occupational Health - Occupational Stress Questionnaire    Feeling of Stress : Not at all  Social Connections: Socially Isolated (05/01/2023)   Received from [facility]   Social Connection and Isolation Panel [NHANES]    Frequency of Communication with Friends and Family: Never    Frequency of Social  Gatherings with Friends and Family: More than three times a week    Attends Religious Services: Never    Database administrator or Organizations: No    Attends Banker Meetings: Never    Marital Status: Divorced    Review of Systems: Gen: Denies fever, chills, anorexia. Denies fatigue, weakness, weight loss.  CV: Denies chest pain, palpitations, syncope, peripheral edema, and claudication. Resp: Denies dyspnea at rest, cough, wheezing, coughing up blood, and pleurisy. GI: Denies vomiting blood, jaundice, and fecal incontinence.   Denies dysphagia or odynophagia. Derm: Denies rash, itching, dry skin Psych: Denies depression, anxiety, memory loss, confusion. No homicidal or suicidal ideation.  Heme: Denies bruising, bleeding, and enlarged lymph nodes.  Physical Exam: There were no vitals taken for this visit. General:   Alert and oriented. No distress noted. Pleasant and cooperative.  Head:  Normocephalic and atraumatic. Eyes:  Conjuctiva clear without scleral icterus. Heart:  S1, S2 present without murmurs appreciated. Lungs:  Clear to auscultation bilaterally. No wheezes, rales, or rhonchi. No distress.  Abdomen:  +BS, soft, non-tender and non-distended. No rebound or guarding. No HSM or masses  noted. Msk:  Symmetrical without gross deformities. Normal posture. Extremities:  Without edema. Neurologic:  Alert and  oriented x4 Psych:  Normal mood and affect.    Assessment:     Plan:  ***   Ermalinda Memos, PA-C [facility] 06/30/2023

## 2023-06-30 ENCOUNTER — Ambulatory Visit: Payer: Medicare Other | Admitting: Gastroenterology

## 2023-06-30 NOTE — Telephone Encounter (Signed)
Pt came to office today and received a box to mail his monitor back in. AA

## 2023-07-01 ENCOUNTER — Ambulatory Visit: Payer: Medicare Other | Admitting: Internal Medicine

## 2023-07-01 ENCOUNTER — Inpatient Hospital Stay: Payer: Medicare Other | Admitting: Gastroenterology

## 2023-07-01 ENCOUNTER — Encounter: Payer: Self-pay | Admitting: Gastroenterology

## 2023-07-01 ENCOUNTER — Telehealth: Payer: Self-pay

## 2023-07-01 NOTE — Telephone Encounter (Signed)
Left vm regarding NO SHOW on 07/01/23 with Dr.Vu. Patient can reschedule upon call back.

## 2023-07-02 DIAGNOSIS — J90 Pleural effusion, not elsewhere classified: Secondary | ICD-10-CM | POA: Diagnosis not present

## 2023-07-02 DIAGNOSIS — G8929 Other chronic pain: Secondary | ICD-10-CM | POA: Diagnosis not present

## 2023-07-02 DIAGNOSIS — Z7984 Long term (current) use of oral hypoglycemic drugs: Secondary | ICD-10-CM | POA: Diagnosis not present

## 2023-07-02 DIAGNOSIS — R339 Retention of urine, unspecified: Secondary | ICD-10-CM | POA: Diagnosis not present

## 2023-07-02 DIAGNOSIS — R531 Weakness: Secondary | ICD-10-CM | POA: Diagnosis not present

## 2023-07-02 DIAGNOSIS — Z9889 Other specified postprocedural states: Secondary | ICD-10-CM | POA: Diagnosis not present

## 2023-07-02 DIAGNOSIS — S30810A Abrasion of lower back and pelvis, initial encounter: Secondary | ICD-10-CM | POA: Diagnosis not present

## 2023-07-02 DIAGNOSIS — R0989 Other specified symptoms and signs involving the circulatory and respiratory systems: Secondary | ICD-10-CM | POA: Diagnosis not present

## 2023-07-02 DIAGNOSIS — E11649 Type 2 diabetes mellitus with hypoglycemia without coma: Secondary | ICD-10-CM | POA: Diagnosis not present

## 2023-07-02 DIAGNOSIS — W1839XA Other fall on same level, initial encounter: Secondary | ICD-10-CM | POA: Diagnosis not present

## 2023-07-02 DIAGNOSIS — Z604 Social exclusion and rejection: Secondary | ICD-10-CM | POA: Diagnosis not present

## 2023-07-02 DIAGNOSIS — Z79899 Other long term (current) drug therapy: Secondary | ICD-10-CM | POA: Diagnosis not present

## 2023-07-02 DIAGNOSIS — Z95828 Presence of other vascular implants and grafts: Secondary | ICD-10-CM | POA: Diagnosis not present

## 2023-07-02 DIAGNOSIS — S90412A Abrasion, left great toe, initial encounter: Secondary | ICD-10-CM | POA: Diagnosis not present

## 2023-07-02 DIAGNOSIS — R42 Dizziness and giddiness: Secondary | ICD-10-CM | POA: Diagnosis not present

## 2023-07-02 DIAGNOSIS — Z743 Need for continuous supervision: Secondary | ICD-10-CM | POA: Diagnosis not present

## 2023-07-02 DIAGNOSIS — W19XXXA Unspecified fall, initial encounter: Secondary | ICD-10-CM | POA: Diagnosis not present

## 2023-07-02 DIAGNOSIS — R54 Age-related physical debility: Secondary | ICD-10-CM | POA: Diagnosis not present

## 2023-07-02 DIAGNOSIS — Z635 Disruption of family by separation and divorce: Secondary | ICD-10-CM | POA: Diagnosis not present

## 2023-07-02 DIAGNOSIS — R55 Syncope and collapse: Secondary | ICD-10-CM | POA: Diagnosis not present

## 2023-07-02 DIAGNOSIS — M4802 Spinal stenosis, cervical region: Secondary | ICD-10-CM | POA: Diagnosis not present

## 2023-07-02 DIAGNOSIS — E11622 Type 2 diabetes mellitus with other skin ulcer: Secondary | ICD-10-CM | POA: Diagnosis not present

## 2023-07-02 DIAGNOSIS — Z792 Long term (current) use of antibiotics: Secondary | ICD-10-CM | POA: Diagnosis not present

## 2023-07-02 DIAGNOSIS — E11621 Type 2 diabetes mellitus with foot ulcer: Secondary | ICD-10-CM | POA: Diagnosis not present

## 2023-07-02 DIAGNOSIS — G4730 Sleep apnea, unspecified: Secondary | ICD-10-CM | POA: Diagnosis not present

## 2023-07-02 DIAGNOSIS — J1569 Pneumonia due to other gram-negative bacteria: Secondary | ICD-10-CM | POA: Diagnosis not present

## 2023-07-02 DIAGNOSIS — M4322 Fusion of spine, cervical region: Secondary | ICD-10-CM | POA: Diagnosis not present

## 2023-07-02 DIAGNOSIS — Z87891 Personal history of nicotine dependence: Secondary | ICD-10-CM | POA: Diagnosis not present

## 2023-07-02 DIAGNOSIS — L97429 Non-pressure chronic ulcer of left heel and midfoot with unspecified severity: Secondary | ICD-10-CM | POA: Diagnosis not present

## 2023-07-02 DIAGNOSIS — S0003XA Contusion of scalp, initial encounter: Secondary | ICD-10-CM | POA: Diagnosis not present

## 2023-07-02 DIAGNOSIS — R220 Localized swelling, mass and lump, head: Secondary | ICD-10-CM | POA: Diagnosis not present

## 2023-07-02 DIAGNOSIS — G479 Sleep disorder, unspecified: Secondary | ICD-10-CM | POA: Diagnosis not present

## 2023-07-02 DIAGNOSIS — I4891 Unspecified atrial fibrillation: Secondary | ICD-10-CM | POA: Diagnosis not present

## 2023-07-02 DIAGNOSIS — L97422 Non-pressure chronic ulcer of left heel and midfoot with fat layer exposed: Secondary | ICD-10-CM | POA: Diagnosis not present

## 2023-07-02 DIAGNOSIS — I480 Paroxysmal atrial fibrillation: Secondary | ICD-10-CM | POA: Diagnosis not present

## 2023-07-02 DIAGNOSIS — K219 Gastro-esophageal reflux disease without esophagitis: Secondary | ICD-10-CM | POA: Diagnosis not present

## 2023-07-02 DIAGNOSIS — Z86718 Personal history of other venous thrombosis and embolism: Secondary | ICD-10-CM | POA: Diagnosis not present

## 2023-07-02 DIAGNOSIS — R404 Transient alteration of awareness: Secondary | ICD-10-CM | POA: Diagnosis not present

## 2023-07-02 DIAGNOSIS — M545 Low back pain, unspecified: Secondary | ICD-10-CM | POA: Diagnosis not present

## 2023-07-02 DIAGNOSIS — I10 Essential (primary) hypertension: Secondary | ICD-10-CM | POA: Diagnosis not present

## 2023-07-02 DIAGNOSIS — R140 Abdominal distension (gaseous): Secondary | ICD-10-CM | POA: Diagnosis not present

## 2023-07-02 DIAGNOSIS — Z794 Long term (current) use of insulin: Secondary | ICD-10-CM | POA: Diagnosis not present

## 2023-07-02 DIAGNOSIS — Z7901 Long term (current) use of anticoagulants: Secondary | ICD-10-CM | POA: Diagnosis not present

## 2023-07-02 DIAGNOSIS — L89623 Pressure ulcer of left heel, stage 3: Secondary | ICD-10-CM | POA: Diagnosis not present

## 2023-07-02 DIAGNOSIS — E876 Hypokalemia: Secondary | ICD-10-CM | POA: Diagnosis not present

## 2023-07-02 DIAGNOSIS — L98499 Non-pressure chronic ulcer of skin of other sites with unspecified severity: Secondary | ICD-10-CM | POA: Diagnosis not present

## 2023-07-02 DIAGNOSIS — G238 Other specified degenerative diseases of basal ganglia: Secondary | ICD-10-CM | POA: Diagnosis not present

## 2023-07-02 DIAGNOSIS — L89629 Pressure ulcer of left heel, unspecified stage: Secondary | ICD-10-CM | POA: Diagnosis not present

## 2023-07-02 DIAGNOSIS — M47812 Spondylosis without myelopathy or radiculopathy, cervical region: Secondary | ICD-10-CM | POA: Diagnosis not present

## 2023-07-02 DIAGNOSIS — J189 Pneumonia, unspecified organism: Secondary | ICD-10-CM | POA: Diagnosis not present

## 2023-07-02 DIAGNOSIS — E785 Hyperlipidemia, unspecified: Secondary | ICD-10-CM | POA: Diagnosis not present

## 2023-07-02 DIAGNOSIS — E162 Hypoglycemia, unspecified: Secondary | ICD-10-CM | POA: Diagnosis not present

## 2023-07-02 DIAGNOSIS — I959 Hypotension, unspecified: Secondary | ICD-10-CM | POA: Diagnosis not present

## 2023-07-02 DIAGNOSIS — L89113 Pressure ulcer of right upper back, stage 3: Secondary | ICD-10-CM | POA: Diagnosis not present

## 2023-07-02 DIAGNOSIS — Z8679 Personal history of other diseases of the circulatory system: Secondary | ICD-10-CM | POA: Diagnosis not present

## 2023-07-02 DIAGNOSIS — R918 Other nonspecific abnormal finding of lung field: Secondary | ICD-10-CM | POA: Diagnosis not present

## 2023-07-03 DIAGNOSIS — R42 Dizziness and giddiness: Secondary | ICD-10-CM | POA: Diagnosis not present

## 2023-07-03 DIAGNOSIS — W19XXXA Unspecified fall, initial encounter: Secondary | ICD-10-CM | POA: Diagnosis not present

## 2023-07-03 DIAGNOSIS — L89629 Pressure ulcer of left heel, unspecified stage: Secondary | ICD-10-CM | POA: Diagnosis not present

## 2023-07-03 DIAGNOSIS — Z8679 Personal history of other diseases of the circulatory system: Secondary | ICD-10-CM | POA: Diagnosis not present

## 2023-07-03 DIAGNOSIS — I4891 Unspecified atrial fibrillation: Secondary | ICD-10-CM | POA: Diagnosis not present

## 2023-07-03 DIAGNOSIS — E11649 Type 2 diabetes mellitus with hypoglycemia without coma: Secondary | ICD-10-CM | POA: Diagnosis not present

## 2023-07-03 DIAGNOSIS — J189 Pneumonia, unspecified organism: Secondary | ICD-10-CM | POA: Diagnosis not present

## 2023-07-03 DIAGNOSIS — R531 Weakness: Secondary | ICD-10-CM | POA: Diagnosis not present

## 2023-07-03 DIAGNOSIS — E785 Hyperlipidemia, unspecified: Secondary | ICD-10-CM | POA: Diagnosis not present

## 2023-07-03 DIAGNOSIS — E876 Hypokalemia: Secondary | ICD-10-CM | POA: Diagnosis not present

## 2023-07-03 DIAGNOSIS — Z792 Long term (current) use of antibiotics: Secondary | ICD-10-CM | POA: Diagnosis not present

## 2023-07-04 DIAGNOSIS — E11622 Type 2 diabetes mellitus with other skin ulcer: Secondary | ICD-10-CM | POA: Diagnosis not present

## 2023-07-04 DIAGNOSIS — Z7901 Long term (current) use of anticoagulants: Secondary | ICD-10-CM | POA: Diagnosis not present

## 2023-07-04 DIAGNOSIS — R54 Age-related physical debility: Secondary | ICD-10-CM | POA: Diagnosis not present

## 2023-07-04 DIAGNOSIS — I4891 Unspecified atrial fibrillation: Secondary | ICD-10-CM | POA: Diagnosis not present

## 2023-07-04 DIAGNOSIS — L98499 Non-pressure chronic ulcer of skin of other sites with unspecified severity: Secondary | ICD-10-CM | POA: Diagnosis not present

## 2023-07-04 DIAGNOSIS — R42 Dizziness and giddiness: Secondary | ICD-10-CM | POA: Diagnosis not present

## 2023-07-04 DIAGNOSIS — Z792 Long term (current) use of antibiotics: Secondary | ICD-10-CM | POA: Diagnosis not present

## 2023-07-04 DIAGNOSIS — W19XXXA Unspecified fall, initial encounter: Secondary | ICD-10-CM | POA: Diagnosis not present

## 2023-07-04 DIAGNOSIS — L89113 Pressure ulcer of right upper back, stage 3: Secondary | ICD-10-CM | POA: Diagnosis not present

## 2023-07-04 DIAGNOSIS — Z79899 Other long term (current) drug therapy: Secondary | ICD-10-CM | POA: Diagnosis not present

## 2023-07-04 DIAGNOSIS — J189 Pneumonia, unspecified organism: Secondary | ICD-10-CM | POA: Diagnosis not present

## 2023-07-04 DIAGNOSIS — L97422 Non-pressure chronic ulcer of left heel and midfoot with fat layer exposed: Secondary | ICD-10-CM | POA: Diagnosis not present

## 2023-07-04 DIAGNOSIS — E11649 Type 2 diabetes mellitus with hypoglycemia without coma: Secondary | ICD-10-CM | POA: Diagnosis not present

## 2023-07-04 DIAGNOSIS — E876 Hypokalemia: Secondary | ICD-10-CM | POA: Diagnosis not present

## 2023-07-05 DIAGNOSIS — L89629 Pressure ulcer of left heel, unspecified stage: Secondary | ICD-10-CM | POA: Diagnosis not present

## 2023-07-05 DIAGNOSIS — I4891 Unspecified atrial fibrillation: Secondary | ICD-10-CM | POA: Diagnosis not present

## 2023-07-05 DIAGNOSIS — J189 Pneumonia, unspecified organism: Secondary | ICD-10-CM | POA: Diagnosis not present

## 2023-07-05 DIAGNOSIS — W19XXXA Unspecified fall, initial encounter: Secondary | ICD-10-CM | POA: Diagnosis not present

## 2023-07-05 DIAGNOSIS — R531 Weakness: Secondary | ICD-10-CM | POA: Diagnosis not present

## 2023-07-05 DIAGNOSIS — E11649 Type 2 diabetes mellitus with hypoglycemia without coma: Secondary | ICD-10-CM | POA: Diagnosis not present

## 2023-07-05 DIAGNOSIS — R42 Dizziness and giddiness: Secondary | ICD-10-CM | POA: Diagnosis not present

## 2023-07-05 DIAGNOSIS — Z79899 Other long term (current) drug therapy: Secondary | ICD-10-CM | POA: Diagnosis not present

## 2023-07-05 DIAGNOSIS — Z7901 Long term (current) use of anticoagulants: Secondary | ICD-10-CM | POA: Diagnosis not present

## 2023-07-06 DIAGNOSIS — Z7901 Long term (current) use of anticoagulants: Secondary | ICD-10-CM | POA: Diagnosis not present

## 2023-07-06 DIAGNOSIS — E11622 Type 2 diabetes mellitus with other skin ulcer: Secondary | ICD-10-CM | POA: Diagnosis not present

## 2023-07-06 DIAGNOSIS — R42 Dizziness and giddiness: Secondary | ICD-10-CM | POA: Diagnosis not present

## 2023-07-06 DIAGNOSIS — J189 Pneumonia, unspecified organism: Secondary | ICD-10-CM | POA: Diagnosis not present

## 2023-07-06 DIAGNOSIS — I4891 Unspecified atrial fibrillation: Secondary | ICD-10-CM | POA: Diagnosis not present

## 2023-07-06 DIAGNOSIS — Z79899 Other long term (current) drug therapy: Secondary | ICD-10-CM | POA: Diagnosis not present

## 2023-07-06 DIAGNOSIS — E11649 Type 2 diabetes mellitus with hypoglycemia without coma: Secondary | ICD-10-CM | POA: Diagnosis not present

## 2023-07-06 DIAGNOSIS — W19XXXA Unspecified fall, initial encounter: Secondary | ICD-10-CM | POA: Diagnosis not present

## 2023-07-06 DIAGNOSIS — E876 Hypokalemia: Secondary | ICD-10-CM | POA: Diagnosis not present

## 2023-07-06 DIAGNOSIS — Z792 Long term (current) use of antibiotics: Secondary | ICD-10-CM | POA: Diagnosis not present

## 2023-07-06 DIAGNOSIS — L98499 Non-pressure chronic ulcer of skin of other sites with unspecified severity: Secondary | ICD-10-CM | POA: Diagnosis not present

## 2023-07-06 DIAGNOSIS — R54 Age-related physical debility: Secondary | ICD-10-CM | POA: Diagnosis not present

## 2023-07-07 DIAGNOSIS — E876 Hypokalemia: Secondary | ICD-10-CM | POA: Diagnosis not present

## 2023-07-07 DIAGNOSIS — G479 Sleep disorder, unspecified: Secondary | ICD-10-CM | POA: Diagnosis not present

## 2023-07-07 DIAGNOSIS — R339 Retention of urine, unspecified: Secondary | ICD-10-CM | POA: Diagnosis not present

## 2023-07-07 DIAGNOSIS — E11649 Type 2 diabetes mellitus with hypoglycemia without coma: Secondary | ICD-10-CM | POA: Diagnosis not present

## 2023-07-07 DIAGNOSIS — L89113 Pressure ulcer of right upper back, stage 3: Secondary | ICD-10-CM | POA: Diagnosis not present

## 2023-07-07 DIAGNOSIS — R54 Age-related physical debility: Secondary | ICD-10-CM | POA: Diagnosis not present

## 2023-07-07 DIAGNOSIS — E11621 Type 2 diabetes mellitus with foot ulcer: Secondary | ICD-10-CM | POA: Diagnosis not present

## 2023-07-07 DIAGNOSIS — L97429 Non-pressure chronic ulcer of left heel and midfoot with unspecified severity: Secondary | ICD-10-CM | POA: Diagnosis not present

## 2023-07-07 DIAGNOSIS — J189 Pneumonia, unspecified organism: Secondary | ICD-10-CM | POA: Diagnosis not present

## 2023-07-07 DIAGNOSIS — Z792 Long term (current) use of antibiotics: Secondary | ICD-10-CM | POA: Diagnosis not present

## 2023-07-07 DIAGNOSIS — L97422 Non-pressure chronic ulcer of left heel and midfoot with fat layer exposed: Secondary | ICD-10-CM | POA: Diagnosis not present

## 2023-07-07 DIAGNOSIS — Z79899 Other long term (current) drug therapy: Secondary | ICD-10-CM | POA: Diagnosis not present

## 2023-07-07 DIAGNOSIS — Z7901 Long term (current) use of anticoagulants: Secondary | ICD-10-CM | POA: Diagnosis not present

## 2023-07-07 DIAGNOSIS — I4891 Unspecified atrial fibrillation: Secondary | ICD-10-CM | POA: Diagnosis not present

## 2023-07-08 ENCOUNTER — Encounter: Payer: Self-pay | Admitting: Urology

## 2023-07-08 ENCOUNTER — Ambulatory Visit (INDEPENDENT_AMBULATORY_CARE_PROVIDER_SITE_OTHER): Payer: Medicare HMO | Admitting: Urology

## 2023-07-08 VITALS — BP 101/55 | HR 63 | Temp 97.6°F

## 2023-07-08 DIAGNOSIS — Z09 Encounter for follow-up examination after completed treatment for conditions other than malignant neoplasm: Secondary | ICD-10-CM

## 2023-07-08 DIAGNOSIS — G8929 Other chronic pain: Secondary | ICD-10-CM | POA: Insufficient documentation

## 2023-07-08 DIAGNOSIS — R339 Retention of urine, unspecified: Secondary | ICD-10-CM

## 2023-07-08 DIAGNOSIS — Z978 Presence of other specified devices: Secondary | ICD-10-CM

## 2023-07-08 DIAGNOSIS — N401 Enlarged prostate with lower urinary tract symptoms: Secondary | ICD-10-CM | POA: Diagnosis not present

## 2023-07-08 DIAGNOSIS — M48061 Spinal stenosis, lumbar region without neurogenic claudication: Secondary | ICD-10-CM | POA: Insufficient documentation

## 2023-07-08 NOTE — Progress Notes (Signed)
Name: Jim Ramirez DOB: June 17, 1951 MRN: [id number]  History of Present Illness: Jim Ramirez is a 72 y.o. male who presents today as a new patient at [facility]. All available relevant medical records have been reviewed. He is accompanied by his sister Jim Ramirez. - GU History includes: 1. BPH. 2. Kidney stones.  Relevant imaging: > 05/22/2023: CT abdomen/pelvis w/o contrast showed bilateral intrarenal stones (<4 mm), no hydronephrosis, "Prostate is unremarkable."  He reports chief complaint of urinary retention.  Recent history:  07/02/2023 - 07/07/2023:  - Admitted for bilateral pneumonia following a fall at home.  - Per note:  "Foley was placed day of admission due to large residual Foley removed morning of discharge, very little urine output, reinserted Foley catheter Case management will organize urology referral for the patient to follow-up next week. Continue Flomax" - Negative urine cultures during admission.  Today: He denies history of urinary retention prior to current episode.  He reports the catheter is draining well.  He denies gross hematuria.  He denies flank pain or abdominal pain. He denies fevers, nausea, or vomiting.  He denies constipation. He denies taking laxatives or stool softeners.   Fall Screening: Do you usually have a device to assist in your mobility? Yes   Medications: Current Outpatient Medications  Medication Sig Dispense Refill   acetaminophen (TYLENOL) 325 MG tablet Take 2 tablets (650 mg total) by mouth every 6 (six) hours as needed for mild pain (pain score 1-3) (or Fever >/= 101).     albuterol (VENTOLIN HFA) 108 (90 Base) MCG/ACT inhaler Inhale 2 puffs into the lungs every 6 (six) hours as needed for wheezing or shortness of breath. 18 g 2   apixaban (ELIQUIS) 5 MG TABS tablet Take 1 tablet (5 mg total) by mouth 2 (two) times daily. 60 tablet 11   ascorbic acid (VITAMIN C) 500 MG tablet Take 500 mg by mouth daily.      cefadroxil (DURICEF) 500 MG capsule Take 2 capsules (1,000 mg total) by mouth 2 (two) times daily. Continue for long term thru end of May 2025     clotrimazole-betamethasone (LOTRISONE) cream Apply 1 Application topically 2 (two) times daily. Apply to sacrum and groin topically for wound care     esomeprazole (NEXIUM) 40 MG capsule Take 40 mg by mouth daily.     melatonin 3 MG TABS tablet Take 3 mg by mouth at bedtime.     metoprolol tartrate (LOPRESSOR) 25 MG tablet Take 0.5 tablets (12.5 mg total) by mouth 2 (two) times daily.     Nutritional Supplements (PROMOD) LIQD Take 30 mLs by mouth in the morning and at bedtime. For wound healing     oxyCODONE (OXY IR/ROXICODONE) 5 MG immediate release tablet Take 1 tablet (5 mg total) by mouth every 6 (six) hours as needed for moderate pain (pain score 4-6) or severe pain (pain score 7-10). 12 tablet 0   tamsulosin (FLOMAX) 0.4 MG CAPS capsule Take 0.4 mg by mouth daily.     Zinc 50 MG TABS Take 50 mg by mouth daily.     No current facility-administered medications for this visit.    Allergies: No Known Allergies  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   CTA negative for aneurysm   Depressed    Diabetes mellitus without complication (HCC)    DVT (deep venous thrombosis) (HCC)    History of kidney stones    Hypertension  Nephrolithiasis    Sleep apnea    Past Surgical History:  Procedure Laterality Date   ANTERIOR CERVICAL DECOMP/DISCECTOMY FUSION N/A 05/22/2022   Procedure: ANTERIOR CERVICAL DISCECTOMY FUSION, INTERBODY PROSTHESIS,PLATE/SCREWS CERVICAL THREE-FOUR, CERVICAL FOUR-FIVE;REMOVAL CERVICAL PLATE;  Surgeon: Tressie Stalker, MD;  Location: [facility] OR;  Service: Neurosurgery;  Laterality: N/A;  3C   CATARACT EXTRACTION W/PHACO Left 09/23/2022   Procedure: CATARACT EXTRACTION PHACO AND INTRAOCULAR LENS PLACEMENT (IOC);  Surgeon: Fabio Pierce, MD;  Location: [facility]  ORS;  Service: Ophthalmology;  Laterality: Left;  CDE: 7.52   CATARACT EXTRACTION W/PHACO Right 10/07/2022   Procedure: CATARACT EXTRACTION PHACO AND INTRAOCULAR LENS PLACEMENT (IOC);  Surgeon: Fabio Pierce, MD;  Location: [facility] ORS;  Service: Ophthalmology;  Laterality: Right;  CDE: 9.40   IR IVC FILTER PLMT / S&I /IMG GUID/MOD SED  01/19/2023   LUMBAR LAMINECTOMY/DECOMPRESSION MICRODISCECTOMY N/A 01/20/2023   Procedure: LUMBAR LAM FOR EPIDURAL ABSCESS, LUMBAR THREE-FOUR, LUMBAR FOUR-FIVE,  LUMBAR FIVE-SACRAL ONE;  Surgeon: Tressie Stalker, MD;  Location: [facility] OR;  Service: Neurosurgery;  Laterality: N/A;   STOMACH SURGERY     TEE WITHOUT CARDIOVERSION N/A 12/31/2022   Procedure: TRANSESOPHAGEAL ECHOCARDIOGRAM;  Surgeon: Little Ishikawa, MD;  Location: [facility] INVASIVE CV LAB;  Service: Cardiovascular;  Laterality: N/A;   TONSILLECTOMY  2000   Family History  Problem Relation Age of Onset   Heart attack Mother    Heart attack Father    Hypertension Brother    Diabetes Brother    Coronary artery disease Brother    Social History   Socioeconomic History   Marital status: Significant Other    Spouse name: Not on file   Number of children: 2   Years of education: Not on file   Highest education level: Not on file  Occupational History   Not on file  Tobacco Use   Smoking status: Former    Current packs/day: 0.00    Types: Cigarettes    Quit date: 1994    Years since quitting: 30.9    Passive exposure: Never   Smokeless tobacco: Never  Vaping Use   Vaping status: Never Used  Substance and Sexual Activity   Alcohol use: Not Currently   Drug use: Never   Sexual activity: Yes  Other Topics Concern   Not on file  Social History Narrative   Not on file   Social Determinants of Health   Financial Resource Strain: Low Risk  (07/03/2023)   Received from [facility]   Overall Financial Resource Strain (CARDIA)    Difficulty of Paying Living Expenses: Not hard at all  Food  Insecurity: No Food Insecurity (07/03/2023)   Received from [facility]   Hunger Vital Sign    Worried About Running Out of Food in the Last Year: Never true    Ran Out of Food in the Last Year: Never true  Transportation Needs: No Transportation Needs (07/03/2023)   Received from [facility]   PRAPARE - Transportation    Lack of Transportation (Medical): No    Lack of Transportation (Non-Medical): No  Physical Activity: Inactive (07/03/2023)   Received from [facility]   Exercise Vital Sign    Days of Exercise per Week: 0 days    Minutes of Exercise per Session: 0 min  Stress: No Stress Concern Present (07/03/2023)   Received from [facility] of Occupational Health - Occupational Stress Questionnaire    Feeling of Stress : Not at all  Social Connections: Moderately Integrated (07/03/2023)   Received from [facility]   Social Connection and Isolation Panel [NHANES]    Frequency of Communication with Friends and Family: More than three times a week    Frequency of Social Gatherings with Friends and Family: More than three times a week    Attends Religious Services: More than 4 times per year    Active Member of [employer] or Organizations: No    Attends Engineer, structural: More than 4 times per year    Marital Status: Divorced  Recent Concern: Social Connections - Socially Isolated (05/01/2023)   Received from [facility]   Social Connection and Isolation Panel [NHANES]    Frequency of Communication with Friends and Family: Never    Frequency of Social Gatherings with Friends and Family: More than three times a week    Attends Religious Services: Never    Database administrator or Organizations: No    Attends Banker Meetings: Never    Marital Status: Divorced  Catering manager Violence: Not At Risk (07/03/2023)   Received from [facility]   Humiliation, Afraid, Rape, and Kick questionnaire    Fear of Current or  Ex-Partner: No    Emotionally Abused: No    Physically Abused: No    Sexually Abused: No    SUBJECTIVE  Review of Systems Constitutional: Patient denies any unintentional weight loss or change in strength lntegumentary: Patient denies any rashes or pruritus Cardiovascular: Patient denies chest pain or syncope Respiratory: Patient denies shortness of breath Gastrointestinal: Patient denies nausea, vomiting, constipation, or diarrhea Musculoskeletal: Patient denies muscle cramps or weakness Neurologic: Patient denies convulsions or seizures Allergic/Immunologic: Patient denies recent allergic reaction(s) Hematologic/Lymphatic: Patient denies bleeding tendencies Endocrine: Patient denies heat/cold intolerance  GU: As per HPI.  OBJECTIVE Vitals:   07/08/23 0936  BP: (!) 101/55  Pulse: 63  Temp: 97.6 F (36.4 C)   There is no height or weight on file to calculate BMI.  Physical Examination Constitutional: No obvious distress; patient is non-toxic appearing  Cardiovascular: No visible lower extremity edema.  Respiratory: The patient does not have audible wheezing/stridor; respirations do not appear labored  Gastrointestinal: Abdomen non-distended Musculoskeletal: Normal ROM of UEs  Skin: No obvious rashes/open sores  Neurologic: CN 2-12 grossly intact Psychiatric: Answered questions appropriately with normal affect  Hematologic/Lymphatic/Immunologic: No obvious bruises or sites of spontaneous bleeding  GU: Catheter draining clear yellow urine.   ASSESSMENT Urinary retention  Benign prostatic hyperplasia with urinary retention  Foley catheter present  Hospital discharge follow-up  We discussed pt's urinary retention and possible etiologies including temporary detrusor areflexia, neurogenic bladder, BPH, constipation, anticholinergic medication use. His neurogenic risk factors include T2DM, spinal stenosis, former smoker, age.   We discussed option to increase Flomax  from 0.4 mg daily to 2x/day, however since that could increase his fall risk we agreed to continue with Flomax 0.4 mg once daily for BPH. Offered refills for that but he prefers to get all medication refill from his PCP, who he states he will be seeing within then next week.   We agreed not to proceed with voiding trial today since he just failed one yesterday prior to hospital discharge; we agreed to attempt voiding trial again in 2 weeks. If unable to pass voiding trial at that time, we will pursue further evaluation with office cystoscopy.  Pt and sister verbalized understanding and agreement. All questions were answered.  PLAN Advised the following: Foley catheter  to remain in place. Continue Flomax 0.4 mg daily. Return in about 2 weeks (around 07/22/2023) for voiding trial & f/u with Evette Georges, NP.  No orders of the defined types were placed in this encounter.  It has been explained that the patient is to follow regularly with their PCP in addition to all other providers involved in their care and to follow instructions provided by these respective offices. Patient advised to contact urology clinic if any urologic-pertaining questions, concerns, new symptoms or problems arise in the interim period.  There are no Patient Instructions on file for this visit.  Electronically signed by:  Donnita Falls, MSN, FNP-C, CUNP 07/08/2023 11:03 AM

## 2023-07-09 DIAGNOSIS — L89122 Pressure ulcer of left upper back, stage 2: Secondary | ICD-10-CM | POA: Diagnosis not present

## 2023-07-09 DIAGNOSIS — Z466 Encounter for fitting and adjustment of urinary device: Secondary | ICD-10-CM | POA: Diagnosis not present

## 2023-07-09 DIAGNOSIS — Z794 Long term (current) use of insulin: Secondary | ICD-10-CM | POA: Diagnosis not present

## 2023-07-09 DIAGNOSIS — L97422 Non-pressure chronic ulcer of left heel and midfoot with fat layer exposed: Secondary | ICD-10-CM | POA: Diagnosis not present

## 2023-07-09 DIAGNOSIS — E11621 Type 2 diabetes mellitus with foot ulcer: Secondary | ICD-10-CM | POA: Diagnosis not present

## 2023-07-09 DIAGNOSIS — J189 Pneumonia, unspecified organism: Secondary | ICD-10-CM | POA: Diagnosis not present

## 2023-07-09 DIAGNOSIS — Z87891 Personal history of nicotine dependence: Secondary | ICD-10-CM | POA: Diagnosis not present

## 2023-07-09 DIAGNOSIS — I4891 Unspecified atrial fibrillation: Secondary | ICD-10-CM | POA: Diagnosis not present

## 2023-07-09 DIAGNOSIS — R339 Retention of urine, unspecified: Secondary | ICD-10-CM | POA: Diagnosis not present

## 2023-07-09 DIAGNOSIS — Z7901 Long term (current) use of anticoagulants: Secondary | ICD-10-CM | POA: Diagnosis not present

## 2023-07-09 DIAGNOSIS — I10 Essential (primary) hypertension: Secondary | ICD-10-CM | POA: Diagnosis not present

## 2023-07-09 DIAGNOSIS — E876 Hypokalemia: Secondary | ICD-10-CM | POA: Diagnosis not present

## 2023-07-09 DIAGNOSIS — J188 Other pneumonia, unspecified organism: Secondary | ICD-10-CM | POA: Diagnosis not present

## 2023-07-09 DIAGNOSIS — E11649 Type 2 diabetes mellitus with hypoglycemia without coma: Secondary | ICD-10-CM | POA: Diagnosis not present

## 2023-07-09 DIAGNOSIS — Z7984 Long term (current) use of oral hypoglycemic drugs: Secondary | ICD-10-CM | POA: Diagnosis not present

## 2023-07-09 DIAGNOSIS — Z9181 History of falling: Secondary | ICD-10-CM | POA: Diagnosis not present

## 2023-07-10 DIAGNOSIS — Z9181 History of falling: Secondary | ICD-10-CM | POA: Diagnosis not present

## 2023-07-10 DIAGNOSIS — Z794 Long term (current) use of insulin: Secondary | ICD-10-CM | POA: Diagnosis not present

## 2023-07-10 DIAGNOSIS — E876 Hypokalemia: Secondary | ICD-10-CM | POA: Diagnosis not present

## 2023-07-10 DIAGNOSIS — I10 Essential (primary) hypertension: Secondary | ICD-10-CM | POA: Diagnosis not present

## 2023-07-10 DIAGNOSIS — Z466 Encounter for fitting and adjustment of urinary device: Secondary | ICD-10-CM | POA: Diagnosis not present

## 2023-07-10 DIAGNOSIS — I4891 Unspecified atrial fibrillation: Secondary | ICD-10-CM | POA: Diagnosis not present

## 2023-07-10 DIAGNOSIS — R339 Retention of urine, unspecified: Secondary | ICD-10-CM | POA: Diagnosis not present

## 2023-07-10 DIAGNOSIS — Z7984 Long term (current) use of oral hypoglycemic drugs: Secondary | ICD-10-CM | POA: Diagnosis not present

## 2023-07-10 DIAGNOSIS — L89122 Pressure ulcer of left upper back, stage 2: Secondary | ICD-10-CM | POA: Diagnosis not present

## 2023-07-10 DIAGNOSIS — J188 Other pneumonia, unspecified organism: Secondary | ICD-10-CM | POA: Diagnosis not present

## 2023-07-10 DIAGNOSIS — E11621 Type 2 diabetes mellitus with foot ulcer: Secondary | ICD-10-CM | POA: Diagnosis not present

## 2023-07-10 DIAGNOSIS — E11649 Type 2 diabetes mellitus with hypoglycemia without coma: Secondary | ICD-10-CM | POA: Diagnosis not present

## 2023-07-10 DIAGNOSIS — Z87891 Personal history of nicotine dependence: Secondary | ICD-10-CM | POA: Diagnosis not present

## 2023-07-10 DIAGNOSIS — L97422 Non-pressure chronic ulcer of left heel and midfoot with fat layer exposed: Secondary | ICD-10-CM | POA: Diagnosis not present

## 2023-07-10 DIAGNOSIS — J189 Pneumonia, unspecified organism: Secondary | ICD-10-CM | POA: Diagnosis not present

## 2023-07-10 DIAGNOSIS — Z7901 Long term (current) use of anticoagulants: Secondary | ICD-10-CM | POA: Diagnosis not present

## 2023-07-11 DIAGNOSIS — I4891 Unspecified atrial fibrillation: Secondary | ICD-10-CM | POA: Diagnosis not present

## 2023-07-11 DIAGNOSIS — L97422 Non-pressure chronic ulcer of left heel and midfoot with fat layer exposed: Secondary | ICD-10-CM | POA: Diagnosis not present

## 2023-07-11 DIAGNOSIS — Z7984 Long term (current) use of oral hypoglycemic drugs: Secondary | ICD-10-CM | POA: Diagnosis not present

## 2023-07-11 DIAGNOSIS — Z7901 Long term (current) use of anticoagulants: Secondary | ICD-10-CM | POA: Diagnosis not present

## 2023-07-11 DIAGNOSIS — J188 Other pneumonia, unspecified organism: Secondary | ICD-10-CM | POA: Diagnosis not present

## 2023-07-11 DIAGNOSIS — E876 Hypokalemia: Secondary | ICD-10-CM | POA: Diagnosis not present

## 2023-07-11 DIAGNOSIS — I10 Essential (primary) hypertension: Secondary | ICD-10-CM | POA: Diagnosis not present

## 2023-07-11 DIAGNOSIS — Z87891 Personal history of nicotine dependence: Secondary | ICD-10-CM | POA: Diagnosis not present

## 2023-07-11 DIAGNOSIS — E11621 Type 2 diabetes mellitus with foot ulcer: Secondary | ICD-10-CM | POA: Diagnosis not present

## 2023-07-11 DIAGNOSIS — L89122 Pressure ulcer of left upper back, stage 2: Secondary | ICD-10-CM | POA: Diagnosis not present

## 2023-07-11 DIAGNOSIS — Z466 Encounter for fitting and adjustment of urinary device: Secondary | ICD-10-CM | POA: Diagnosis not present

## 2023-07-11 DIAGNOSIS — Z9181 History of falling: Secondary | ICD-10-CM | POA: Diagnosis not present

## 2023-07-11 DIAGNOSIS — I480 Paroxysmal atrial fibrillation: Secondary | ICD-10-CM | POA: Diagnosis not present

## 2023-07-11 DIAGNOSIS — J189 Pneumonia, unspecified organism: Secondary | ICD-10-CM | POA: Diagnosis not present

## 2023-07-11 DIAGNOSIS — R339 Retention of urine, unspecified: Secondary | ICD-10-CM | POA: Diagnosis not present

## 2023-07-11 DIAGNOSIS — Z794 Long term (current) use of insulin: Secondary | ICD-10-CM | POA: Diagnosis not present

## 2023-07-11 DIAGNOSIS — E11649 Type 2 diabetes mellitus with hypoglycemia without coma: Secondary | ICD-10-CM | POA: Diagnosis not present

## 2023-07-13 DIAGNOSIS — Z7901 Long term (current) use of anticoagulants: Secondary | ICD-10-CM | POA: Diagnosis not present

## 2023-07-13 DIAGNOSIS — E11649 Type 2 diabetes mellitus with hypoglycemia without coma: Secondary | ICD-10-CM | POA: Diagnosis not present

## 2023-07-13 DIAGNOSIS — Z9181 History of falling: Secondary | ICD-10-CM | POA: Diagnosis not present

## 2023-07-13 DIAGNOSIS — I4891 Unspecified atrial fibrillation: Secondary | ICD-10-CM | POA: Diagnosis not present

## 2023-07-13 DIAGNOSIS — Z466 Encounter for fitting and adjustment of urinary device: Secondary | ICD-10-CM | POA: Diagnosis not present

## 2023-07-13 DIAGNOSIS — J188 Other pneumonia, unspecified organism: Secondary | ICD-10-CM | POA: Diagnosis not present

## 2023-07-13 DIAGNOSIS — L97422 Non-pressure chronic ulcer of left heel and midfoot with fat layer exposed: Secondary | ICD-10-CM | POA: Diagnosis not present

## 2023-07-13 DIAGNOSIS — L89122 Pressure ulcer of left upper back, stage 2: Secondary | ICD-10-CM | POA: Diagnosis not present

## 2023-07-13 DIAGNOSIS — I10 Essential (primary) hypertension: Secondary | ICD-10-CM | POA: Diagnosis not present

## 2023-07-13 DIAGNOSIS — E11621 Type 2 diabetes mellitus with foot ulcer: Secondary | ICD-10-CM | POA: Diagnosis not present

## 2023-07-13 DIAGNOSIS — E876 Hypokalemia: Secondary | ICD-10-CM | POA: Diagnosis not present

## 2023-07-13 DIAGNOSIS — Z794 Long term (current) use of insulin: Secondary | ICD-10-CM | POA: Diagnosis not present

## 2023-07-13 DIAGNOSIS — R339 Retention of urine, unspecified: Secondary | ICD-10-CM | POA: Diagnosis not present

## 2023-07-13 DIAGNOSIS — Z87891 Personal history of nicotine dependence: Secondary | ICD-10-CM | POA: Diagnosis not present

## 2023-07-13 DIAGNOSIS — Z7984 Long term (current) use of oral hypoglycemic drugs: Secondary | ICD-10-CM | POA: Diagnosis not present

## 2023-07-13 DIAGNOSIS — J189 Pneumonia, unspecified organism: Secondary | ICD-10-CM | POA: Diagnosis not present

## 2023-07-14 DIAGNOSIS — I10 Essential (primary) hypertension: Secondary | ICD-10-CM | POA: Diagnosis not present

## 2023-07-14 DIAGNOSIS — E11649 Type 2 diabetes mellitus with hypoglycemia without coma: Secondary | ICD-10-CM | POA: Diagnosis not present

## 2023-07-14 DIAGNOSIS — Z7901 Long term (current) use of anticoagulants: Secondary | ICD-10-CM | POA: Diagnosis not present

## 2023-07-14 DIAGNOSIS — Z466 Encounter for fitting and adjustment of urinary device: Secondary | ICD-10-CM | POA: Diagnosis not present

## 2023-07-14 DIAGNOSIS — E876 Hypokalemia: Secondary | ICD-10-CM | POA: Diagnosis not present

## 2023-07-14 DIAGNOSIS — R339 Retention of urine, unspecified: Secondary | ICD-10-CM | POA: Diagnosis not present

## 2023-07-14 DIAGNOSIS — L89122 Pressure ulcer of left upper back, stage 2: Secondary | ICD-10-CM | POA: Diagnosis not present

## 2023-07-14 DIAGNOSIS — E11621 Type 2 diabetes mellitus with foot ulcer: Secondary | ICD-10-CM | POA: Diagnosis not present

## 2023-07-14 DIAGNOSIS — Z87891 Personal history of nicotine dependence: Secondary | ICD-10-CM | POA: Diagnosis not present

## 2023-07-14 DIAGNOSIS — Z7984 Long term (current) use of oral hypoglycemic drugs: Secondary | ICD-10-CM | POA: Diagnosis not present

## 2023-07-14 DIAGNOSIS — L97422 Non-pressure chronic ulcer of left heel and midfoot with fat layer exposed: Secondary | ICD-10-CM | POA: Diagnosis not present

## 2023-07-14 DIAGNOSIS — Z794 Long term (current) use of insulin: Secondary | ICD-10-CM | POA: Diagnosis not present

## 2023-07-14 DIAGNOSIS — I4891 Unspecified atrial fibrillation: Secondary | ICD-10-CM | POA: Diagnosis not present

## 2023-07-14 DIAGNOSIS — J188 Other pneumonia, unspecified organism: Secondary | ICD-10-CM | POA: Diagnosis not present

## 2023-07-14 DIAGNOSIS — Z9181 History of falling: Secondary | ICD-10-CM | POA: Diagnosis not present

## 2023-07-14 DIAGNOSIS — J189 Pneumonia, unspecified organism: Secondary | ICD-10-CM | POA: Diagnosis not present

## 2023-07-15 DIAGNOSIS — J189 Pneumonia, unspecified organism: Secondary | ICD-10-CM | POA: Diagnosis not present

## 2023-07-15 DIAGNOSIS — E11649 Type 2 diabetes mellitus with hypoglycemia without coma: Secondary | ICD-10-CM | POA: Diagnosis not present

## 2023-07-15 DIAGNOSIS — E876 Hypokalemia: Secondary | ICD-10-CM | POA: Diagnosis not present

## 2023-07-15 DIAGNOSIS — R339 Retention of urine, unspecified: Secondary | ICD-10-CM | POA: Diagnosis not present

## 2023-07-15 DIAGNOSIS — J188 Other pneumonia, unspecified organism: Secondary | ICD-10-CM | POA: Diagnosis not present

## 2023-07-15 DIAGNOSIS — L97422 Non-pressure chronic ulcer of left heel and midfoot with fat layer exposed: Secondary | ICD-10-CM | POA: Diagnosis not present

## 2023-07-15 DIAGNOSIS — Z7901 Long term (current) use of anticoagulants: Secondary | ICD-10-CM | POA: Diagnosis not present

## 2023-07-15 DIAGNOSIS — Z87891 Personal history of nicotine dependence: Secondary | ICD-10-CM | POA: Diagnosis not present

## 2023-07-15 DIAGNOSIS — L89122 Pressure ulcer of left upper back, stage 2: Secondary | ICD-10-CM | POA: Diagnosis not present

## 2023-07-15 DIAGNOSIS — Z794 Long term (current) use of insulin: Secondary | ICD-10-CM | POA: Diagnosis not present

## 2023-07-15 DIAGNOSIS — Z7984 Long term (current) use of oral hypoglycemic drugs: Secondary | ICD-10-CM | POA: Diagnosis not present

## 2023-07-15 DIAGNOSIS — Z9181 History of falling: Secondary | ICD-10-CM | POA: Diagnosis not present

## 2023-07-15 DIAGNOSIS — E11621 Type 2 diabetes mellitus with foot ulcer: Secondary | ICD-10-CM | POA: Diagnosis not present

## 2023-07-15 DIAGNOSIS — I10 Essential (primary) hypertension: Secondary | ICD-10-CM | POA: Diagnosis not present

## 2023-07-15 DIAGNOSIS — I4891 Unspecified atrial fibrillation: Secondary | ICD-10-CM | POA: Diagnosis not present

## 2023-07-15 DIAGNOSIS — Z466 Encounter for fitting and adjustment of urinary device: Secondary | ICD-10-CM | POA: Diagnosis not present

## 2023-07-16 DIAGNOSIS — J188 Other pneumonia, unspecified organism: Secondary | ICD-10-CM | POA: Diagnosis not present

## 2023-07-16 DIAGNOSIS — Z79899 Other long term (current) drug therapy: Secondary | ICD-10-CM | POA: Diagnosis not present

## 2023-07-16 DIAGNOSIS — K219 Gastro-esophageal reflux disease without esophagitis: Secondary | ICD-10-CM | POA: Diagnosis not present

## 2023-07-16 DIAGNOSIS — Z87891 Personal history of nicotine dependence: Secondary | ICD-10-CM | POA: Diagnosis not present

## 2023-07-16 DIAGNOSIS — Z794 Long term (current) use of insulin: Secondary | ICD-10-CM | POA: Diagnosis not present

## 2023-07-16 DIAGNOSIS — E11621 Type 2 diabetes mellitus with foot ulcer: Secondary | ICD-10-CM | POA: Insufficient documentation

## 2023-07-16 DIAGNOSIS — E11649 Type 2 diabetes mellitus with hypoglycemia without coma: Secondary | ICD-10-CM | POA: Diagnosis not present

## 2023-07-16 DIAGNOSIS — I10 Essential (primary) hypertension: Secondary | ICD-10-CM | POA: Diagnosis not present

## 2023-07-16 DIAGNOSIS — L97529 Non-pressure chronic ulcer of other part of left foot with unspecified severity: Secondary | ICD-10-CM | POA: Diagnosis not present

## 2023-07-16 DIAGNOSIS — L97422 Non-pressure chronic ulcer of left heel and midfoot with fat layer exposed: Secondary | ICD-10-CM | POA: Diagnosis not present

## 2023-07-16 DIAGNOSIS — L89103 Pressure ulcer of unspecified part of back, stage 3: Secondary | ICD-10-CM | POA: Diagnosis not present

## 2023-07-16 DIAGNOSIS — Z7901 Long term (current) use of anticoagulants: Secondary | ICD-10-CM | POA: Diagnosis not present

## 2023-07-17 DIAGNOSIS — E876 Hypokalemia: Secondary | ICD-10-CM | POA: Diagnosis not present

## 2023-07-17 DIAGNOSIS — Z7984 Long term (current) use of oral hypoglycemic drugs: Secondary | ICD-10-CM | POA: Diagnosis not present

## 2023-07-17 DIAGNOSIS — E11621 Type 2 diabetes mellitus with foot ulcer: Secondary | ICD-10-CM | POA: Diagnosis not present

## 2023-07-17 DIAGNOSIS — I4891 Unspecified atrial fibrillation: Secondary | ICD-10-CM | POA: Diagnosis not present

## 2023-07-17 DIAGNOSIS — R339 Retention of urine, unspecified: Secondary | ICD-10-CM | POA: Diagnosis not present

## 2023-07-17 DIAGNOSIS — I10 Essential (primary) hypertension: Secondary | ICD-10-CM | POA: Diagnosis not present

## 2023-07-17 DIAGNOSIS — E11649 Type 2 diabetes mellitus with hypoglycemia without coma: Secondary | ICD-10-CM | POA: Diagnosis not present

## 2023-07-17 DIAGNOSIS — L97422 Non-pressure chronic ulcer of left heel and midfoot with fat layer exposed: Secondary | ICD-10-CM | POA: Diagnosis not present

## 2023-07-17 DIAGNOSIS — Z7901 Long term (current) use of anticoagulants: Secondary | ICD-10-CM | POA: Diagnosis not present

## 2023-07-17 DIAGNOSIS — Z466 Encounter for fitting and adjustment of urinary device: Secondary | ICD-10-CM | POA: Diagnosis not present

## 2023-07-17 DIAGNOSIS — J189 Pneumonia, unspecified organism: Secondary | ICD-10-CM | POA: Diagnosis not present

## 2023-07-17 DIAGNOSIS — Z794 Long term (current) use of insulin: Secondary | ICD-10-CM | POA: Diagnosis not present

## 2023-07-17 DIAGNOSIS — Z87891 Personal history of nicotine dependence: Secondary | ICD-10-CM | POA: Diagnosis not present

## 2023-07-17 DIAGNOSIS — Z9181 History of falling: Secondary | ICD-10-CM | POA: Diagnosis not present

## 2023-07-17 DIAGNOSIS — J188 Other pneumonia, unspecified organism: Secondary | ICD-10-CM | POA: Diagnosis not present

## 2023-07-17 DIAGNOSIS — L89122 Pressure ulcer of left upper back, stage 2: Secondary | ICD-10-CM | POA: Diagnosis not present

## 2023-07-18 DIAGNOSIS — Z9181 History of falling: Secondary | ICD-10-CM | POA: Diagnosis not present

## 2023-07-18 DIAGNOSIS — Z87891 Personal history of nicotine dependence: Secondary | ICD-10-CM | POA: Diagnosis not present

## 2023-07-18 DIAGNOSIS — E11649 Type 2 diabetes mellitus with hypoglycemia without coma: Secondary | ICD-10-CM | POA: Diagnosis not present

## 2023-07-18 DIAGNOSIS — Z466 Encounter for fitting and adjustment of urinary device: Secondary | ICD-10-CM | POA: Diagnosis not present

## 2023-07-18 DIAGNOSIS — Z7984 Long term (current) use of oral hypoglycemic drugs: Secondary | ICD-10-CM | POA: Diagnosis not present

## 2023-07-18 DIAGNOSIS — E876 Hypokalemia: Secondary | ICD-10-CM | POA: Diagnosis not present

## 2023-07-18 DIAGNOSIS — J189 Pneumonia, unspecified organism: Secondary | ICD-10-CM | POA: Diagnosis not present

## 2023-07-18 DIAGNOSIS — R339 Retention of urine, unspecified: Secondary | ICD-10-CM | POA: Diagnosis not present

## 2023-07-18 DIAGNOSIS — Z794 Long term (current) use of insulin: Secondary | ICD-10-CM | POA: Diagnosis not present

## 2023-07-18 DIAGNOSIS — I4891 Unspecified atrial fibrillation: Secondary | ICD-10-CM | POA: Diagnosis not present

## 2023-07-18 DIAGNOSIS — I10 Essential (primary) hypertension: Secondary | ICD-10-CM | POA: Diagnosis not present

## 2023-07-18 DIAGNOSIS — L89122 Pressure ulcer of left upper back, stage 2: Secondary | ICD-10-CM | POA: Diagnosis not present

## 2023-07-18 DIAGNOSIS — Z7901 Long term (current) use of anticoagulants: Secondary | ICD-10-CM | POA: Diagnosis not present

## 2023-07-18 DIAGNOSIS — L97422 Non-pressure chronic ulcer of left heel and midfoot with fat layer exposed: Secondary | ICD-10-CM | POA: Diagnosis not present

## 2023-07-18 DIAGNOSIS — E11621 Type 2 diabetes mellitus with foot ulcer: Secondary | ICD-10-CM | POA: Diagnosis not present

## 2023-07-18 DIAGNOSIS — J188 Other pneumonia, unspecified organism: Secondary | ICD-10-CM | POA: Diagnosis not present

## 2023-07-18 NOTE — Progress Notes (Signed)
 Name: Jim Ramirez DOB: 1950-12-24 MRN: [id number]  History of Present Illness: Mr. Demeyer is a 72 y.o. male who presents today for follow up visit at [facility] [address].  - GU history: 1. BPH. 2. Kidney stones.   Relevant imaging: > 05/22/2023: CT abdomen/pelvis w/o contrast showed bilateral intrarenal stones (<4 mm), no hydronephrosis, Prostate is unremarkable.   He reports chief complaint of urinary retention.   Recent history:  07/02/2023 - 07/07/2023:  - Admitted for bilateral pneumonia following a fall at home.  - Per note: Foley was placed day of admission due to large residual Foley removed morning of discharge, very little urine output, reinserted Foley catheter Case management will organize urology referral for the patient to follow-up next week. Continue Flomax . - Negative urine cultures during admission.  At initial urology visit on 07/08/2023: - His neurogenic risk factors include T2DM, spinal stenosis, former smoker, age. - Agreed not to increase Flomax  dose due to fall risk. Advised to continue Flomax  0.4 mg daily. - We agreed not to proceed with voiding trial today since he just failed one yesterday prior to hospital discharge; we agreed to attempt voiding trial again in 2 weeks. If unable to pass voiding trial at that time, we will pursue further evaluation with office cystoscopy.  Today: He reports one episode of blood-tinged urine which he attributes to the catheter being pulled slightly. Denies gross hematuria. He denies flank pain or abdominal pain. He denies fevers, nausea, or vomiting.   Fall Screening: Do you usually have a device to assist in your mobility? Yes   Medications: Current Outpatient Medications  Medication Sig Dispense Refill   acetaminophen  (TYLENOL ) 325 MG tablet Take 2 tablets (650 mg total) by mouth every 6 (six) hours as needed for mild pain (pain score 1-3) (or Fever >/= 101).     albuterol  (VENTOLIN  HFA) 108 (90  Base) MCG/ACT inhaler Inhale 2 puffs into the lungs every 6 (six) hours as needed for wheezing or shortness of breath. 18 g 2   apixaban  (ELIQUIS ) 5 MG TABS tablet Take 1 tablet (5 mg total) by mouth 2 (two) times daily. 60 tablet 11   ascorbic acid  (VITAMIN C ) 500 MG tablet Take 500 mg by mouth daily.     betamethasone dipropionate 0.05 % lotion APPLY TO AFFECTED AREA TWICE A DAY     cefadroxil  (DURICEF) 500 MG capsule Take 2 capsules (1,000 mg total) by mouth 2 (two) times daily. Continue for long term thru end of May 2025     clotrimazole-betamethasone (LOTRISONE) cream Apply 1 Application topically 2 (two) times daily. Apply to sacrum and groin topically for wound care     cyanocobalamin  (VITAMIN B12) 1000 MCG/ML injection USE 1 (ONE) MILLILITER ONCE PER MONTH     escitalopram  (LEXAPRO ) 20 MG tablet Take 20 mg by mouth daily.     esomeprazole (NEXIUM) 40 MG capsule Take 40 mg by mouth daily.     ferrous sulfate  325 (65 FE) MG tablet Take 325 mg by mouth 2 (two) times daily.     hydrochlorothiazide  (HYDRODIURIL ) 25 MG tablet Take 25 mg by mouth daily.     levocetirizine (XYZAL ) 5 MG tablet Take 5 mg by mouth daily.     melatonin 3 MG TABS tablet Take 3 mg by mouth at bedtime.     metFORMIN  (GLUCOPHAGE ) 1000 MG tablet Take 1,000 mg by mouth 2 (two) times daily.     metoprolol  succinate (TOPROL -XL) 25 MG 24 hr tablet Take  25 mg by mouth daily.     metoprolol  tartrate (LOPRESSOR ) 25 MG tablet Take 0.5 tablets (12.5 mg total) by mouth 2 (two) times daily.     NOVOLIN 70/30 (70-30) 100 UNIT/ML injection INJECT 65 - 75 U IN THE MORNING AND 12 U IN THE EVENING, MAX DAILY DOSE: 100 UNIT     Nutritional Supplements (PROMOD) LIQD Take 30 mLs by mouth in the morning and at bedtime. For wound healing     oxyCODONE  (OXY IR/ROXICODONE ) 5 MG immediate release tablet Take 1 tablet (5 mg total) by mouth every 6 (six) hours as needed for moderate pain (pain score 4-6) or severe pain (pain score 7-10). 12 tablet 0    rosuvastatin  (CRESTOR ) 20 MG tablet Take 20 mg by mouth daily.     tamsulosin  (FLOMAX ) 0.4 MG CAPS capsule Take 0.4 mg by mouth daily.     trandolapril  (MAVIK ) 2 MG tablet Take 2 mg by mouth daily.     Zinc  50 MG TABS Take 50 mg by mouth daily.     zolpidem  (AMBIEN ) 10 MG tablet Take 10 mg by mouth at bedtime.     No current facility-administered medications for this visit.    Allergies: No Known Allergies  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   CTA negative for aneurysm   Depressed    Diabetes mellitus without complication (HCC)    DVT (deep venous thrombosis) (HCC)    History of kidney stones    Hypertension    Nephrolithiasis    Sleep apnea    Past Surgical History:  Procedure Laterality Date   ANTERIOR CERVICAL DECOMP/DISCECTOMY FUSION N/A 05/22/2022   Procedure: ANTERIOR CERVICAL DISCECTOMY FUSION, INTERBODY PROSTHESIS,PLATE/SCREWS CERVICAL THREE-FOUR, CERVICAL FOUR-FIVE;REMOVAL CERVICAL PLATE;  Surgeon: Mavis Purchase, MD;  Location: [facility] OR;  Service: Neurosurgery;  Laterality: N/A;  3C   CATARACT EXTRACTION W/PHACO Left 09/23/2022   Procedure: CATARACT EXTRACTION PHACO AND INTRAOCULAR LENS PLACEMENT (IOC);  Surgeon: Harrie Agent, MD;  Location: [facility];  Service: Ophthalmology;  Laterality: Left;  CDE: 7.52   CATARACT EXTRACTION W/PHACO Right 10/07/2022   Procedure: CATARACT EXTRACTION PHACO AND INTRAOCULAR LENS PLACEMENT (IOC);  Surgeon: Harrie Agent, MD;  Location: [facility];  Service: Ophthalmology;  Laterality: Right;  CDE: 9.40   IR IVC FILTER PLMT / S&I /IMG GUID/MOD SED  01/19/2023   LUMBAR LAMINECTOMY/DECOMPRESSION MICRODISCECTOMY N/A 01/20/2023   Procedure: LUMBAR LAM FOR EPIDURAL ABSCESS, LUMBAR THREE-FOUR, LUMBAR FOUR-FIVE,  LUMBAR FIVE-SACRAL ONE;  Surgeon: Mavis Purchase, MD;  Location: [facility] OR;  Service: Neurosurgery;  Laterality: N/A;   STOMACH SURGERY     TEE WITHOUT CARDIOVERSION N/A  12/31/2022   Procedure: TRANSESOPHAGEAL ECHOCARDIOGRAM;  Surgeon: Kate Lonni CROME, MD;  Location: [facility] INVASIVE CV LAB;  Service: Cardiovascular;  Laterality: N/A;   TONSILLECTOMY  2000   Family History  Problem Relation Age of Onset   Heart attack Mother    Heart attack Father    Hypertension Brother    Diabetes Brother    Coronary artery disease Brother    Social History   Socioeconomic History   Marital status: Significant Other    Spouse name: Not on file   Number of children: 2   Years of education: Not on file   Highest education level: Not on file  Occupational History   Not on file  Tobacco Use   Smoking status: Former    Current packs/day: 0.00    Types: Cigarettes  Quit date: 1    Years since quitting: 31.0    Passive exposure: Never   Smokeless tobacco: Never  Vaping Use   Vaping status: Never Used  Substance and Sexual Activity   Alcohol  use: Not Currently   Drug use: Never   Sexual activity: Yes  Other Topics Concern   Not on file  Social History Narrative   Not on file   Social Drivers of Health   Financial Resource Strain: Low Risk  (07/03/2023)   Received [facility]   Overall Financial Resource Strain (CARDIA)    Difficulty of Paying Living Expenses: Not hard at all  Food Insecurity: No Food Insecurity (07/03/2023)   Received from [facility]   Hunger Vital Sign    Worried About Running Out of Food in the Last Year: Never true    Ran Out of Food in the Last Year: Never true  Transportation Needs: No Transportation Needs (07/03/2023)   Received from [facility] - Transportation    Lack of Transportation (Medical): No    Lack of Transportation (Non-Medical): No  Physical Activity: Inactive (07/03/2023)   Received from [facility]   Exercise Vital Sign    Days of Exercise per Week: 0 days    Minutes of Exercise per Session: 0 min  Stress: No Stress Concern Present (07/03/2023)   Received from [facility] - Occupational Stress Questionnaire    Feeling of Stress : Not at all  Social Connections: Moderately Integrated (07/03/2023)   Received from [facility]   Social Connection and Isolation Panel [NHANES]    Frequency of Communication with Friends and Family: More than three times a week    Frequency of Social Gatherings with Friends and Family: More than three times a week    Attends Religious Services: More than 4 times per year    Active Member of [employer] or Organizations: No    Attends Engineer, Structural: More than 4 times per year    Marital Status: Divorced  Recent Concern: Social Connections - Socially Isolated (05/01/2023)   Received from [facility]   Social Connection and Isolation Panel [NHANES]    Frequency of Communication with Friends and Family: Never    Frequency of Social Gatherings with Friends and Family: More than three times a week    Attends Religious Services: Never    Database Administrator or Organizations: No    Attends Banker Meetings: Never    Marital Status: Divorced  Catering Manager Violence: Not At Risk (07/03/2023)   Received from [facility]   Humiliation, Afraid, Rape, and Kick questionnaire    Fear of Current or Ex-Partner: No    Emotionally Abused: No    Physically Abused: No    Sexually Abused: No    Review of Systems Constitutional: Patient denies any unintentional weight loss or change in strength lntegumentary: Patient denies any rashes or pruritus Cardiovascular: Patient denies chest pain or syncope Respiratory: Patient denies shortness of breath Gastrointestinal: Patient denies nausea, vomiting, constipation, or diarrhea Musculoskeletal: Patient denies muscle cramps or weakness Neurologic: Patient denies convulsions or seizures Allergic/Immunologic: Patient denies recent allergic reaction(s) Hematologic/Lymphatic: Patient denies bleeding  tendencies Endocrine: Patient denies heat/cold intolerance  GU: As per HPI.  OBJECTIVE Vitals:   07/31/23 1140  BP: 108/64  Pulse: 96   There is no height or weight on file to calculate  BMI.  Physical Examination Constitutional: No obvious distress; patient is non-toxic appearing  Cardiovascular: No visible lower extremity edema.  Respiratory: The patient does not have audible wheezing/stridor; respirations do not appear labored  Gastrointestinal: Abdomen non-distended Musculoskeletal: Normal ROM of UEs  Skin: No obvious rashes/open sores  Neurologic: CN 2-12 grossly intact Psychiatric: Answered questions appropriately with normal affect  Hematologic/Lymphatic/Immunologic: No obvious bruises or sites of spontaneous bleeding   ASSESSMENT Benign prostatic hyperplasia with urinary retention - Plan: Bladder Voiding Trial  Urinary retention - Plan: Bladder Voiding Trial  Foley catheter present  Failed office voiding trial today with PVR >300 ml.   We discussed possible etiologies for his urinary retention - most likely neurogenic bladder and/or BPH/BOO.   We discussed risks related to urinary retention / incomplete bladder emptying including but not limited to: bladder discomfort I pain, overflow urinary incontinence (which can result in skin breakdown / wounds), UTls, pyelonephritis, urosepsis, kidney damage/ failure, decreased kidney function requiring dialysis.  We discussed recommendation for further evaluation via cystoscopy to assess prostate for bladder outlet obstruction; he requested to have that done by Dr. Watt. May consider urodynamic testing in the future if indicated.  For management of urinary retention we discussed options including indwelling Foley catheter or clean intermittent catheterization (CIC). Patient elected to proceed with Foley catheter; he was advised catheter will need to be exchanged every 4 weeks.  Pt verbalized understanding and agreement. All  questions were answered.   PLAN Advised the following: 1. Foley catheter reinserted. 2. Continue Flomax  0.4 mg daily. 3. Return for 1st available cystoscopy with Dr. Watt; if >4 weeks out: nurse visit in 1 month for cath exchange.  Orders Placed This Encounter  Procedures   Bladder Voiding Trial    It has been explained that the patient is to follow regularly with their PCP in addition to all other providers involved in their care and to follow instructions provided by these respective offices. Patient advised to contact urology clinic if any urologic-pertaining questions, concerns, new symptoms or problems arise in the interim period.  There are no Patient Instructions on file for this visit.  Electronically signed by:  Lauraine JAYSON Oz, FNP   07/31/23    12:44 PM

## 2023-07-21 DIAGNOSIS — E876 Hypokalemia: Secondary | ICD-10-CM | POA: Diagnosis not present

## 2023-07-21 DIAGNOSIS — Z466 Encounter for fitting and adjustment of urinary device: Secondary | ICD-10-CM | POA: Diagnosis not present

## 2023-07-21 DIAGNOSIS — J189 Pneumonia, unspecified organism: Secondary | ICD-10-CM | POA: Diagnosis not present

## 2023-07-21 DIAGNOSIS — E11621 Type 2 diabetes mellitus with foot ulcer: Secondary | ICD-10-CM | POA: Diagnosis not present

## 2023-07-21 DIAGNOSIS — J188 Other pneumonia, unspecified organism: Secondary | ICD-10-CM | POA: Diagnosis not present

## 2023-07-21 DIAGNOSIS — Z7901 Long term (current) use of anticoagulants: Secondary | ICD-10-CM | POA: Diagnosis not present

## 2023-07-21 DIAGNOSIS — L97422 Non-pressure chronic ulcer of left heel and midfoot with fat layer exposed: Secondary | ICD-10-CM | POA: Diagnosis not present

## 2023-07-21 DIAGNOSIS — E11649 Type 2 diabetes mellitus with hypoglycemia without coma: Secondary | ICD-10-CM | POA: Diagnosis not present

## 2023-07-21 DIAGNOSIS — I10 Essential (primary) hypertension: Secondary | ICD-10-CM | POA: Diagnosis not present

## 2023-07-21 DIAGNOSIS — L89122 Pressure ulcer of left upper back, stage 2: Secondary | ICD-10-CM | POA: Diagnosis not present

## 2023-07-21 DIAGNOSIS — Z87891 Personal history of nicotine dependence: Secondary | ICD-10-CM | POA: Diagnosis not present

## 2023-07-21 DIAGNOSIS — Z9181 History of falling: Secondary | ICD-10-CM | POA: Diagnosis not present

## 2023-07-21 DIAGNOSIS — Z794 Long term (current) use of insulin: Secondary | ICD-10-CM | POA: Diagnosis not present

## 2023-07-21 DIAGNOSIS — R339 Retention of urine, unspecified: Secondary | ICD-10-CM | POA: Diagnosis not present

## 2023-07-21 DIAGNOSIS — I4891 Unspecified atrial fibrillation: Secondary | ICD-10-CM | POA: Diagnosis not present

## 2023-07-21 DIAGNOSIS — Z7984 Long term (current) use of oral hypoglycemic drugs: Secondary | ICD-10-CM | POA: Diagnosis not present

## 2023-07-25 DIAGNOSIS — Z7901 Long term (current) use of anticoagulants: Secondary | ICD-10-CM | POA: Diagnosis not present

## 2023-07-25 DIAGNOSIS — R339 Retention of urine, unspecified: Secondary | ICD-10-CM | POA: Diagnosis not present

## 2023-07-25 DIAGNOSIS — E11649 Type 2 diabetes mellitus with hypoglycemia without coma: Secondary | ICD-10-CM | POA: Diagnosis not present

## 2023-07-25 DIAGNOSIS — E876 Hypokalemia: Secondary | ICD-10-CM | POA: Diagnosis not present

## 2023-07-25 DIAGNOSIS — Z9181 History of falling: Secondary | ICD-10-CM | POA: Diagnosis not present

## 2023-07-25 DIAGNOSIS — J069 Acute upper respiratory infection, unspecified: Secondary | ICD-10-CM | POA: Diagnosis not present

## 2023-07-25 DIAGNOSIS — Z466 Encounter for fitting and adjustment of urinary device: Secondary | ICD-10-CM | POA: Diagnosis not present

## 2023-07-25 DIAGNOSIS — Z794 Long term (current) use of insulin: Secondary | ICD-10-CM | POA: Diagnosis not present

## 2023-07-25 DIAGNOSIS — Z87891 Personal history of nicotine dependence: Secondary | ICD-10-CM | POA: Diagnosis not present

## 2023-07-25 DIAGNOSIS — R0981 Nasal congestion: Secondary | ICD-10-CM | POA: Diagnosis not present

## 2023-07-25 DIAGNOSIS — J189 Pneumonia, unspecified organism: Secondary | ICD-10-CM | POA: Diagnosis not present

## 2023-07-25 DIAGNOSIS — I4891 Unspecified atrial fibrillation: Secondary | ICD-10-CM | POA: Diagnosis not present

## 2023-07-25 DIAGNOSIS — E11621 Type 2 diabetes mellitus with foot ulcer: Secondary | ICD-10-CM | POA: Diagnosis not present

## 2023-07-25 DIAGNOSIS — Z7984 Long term (current) use of oral hypoglycemic drugs: Secondary | ICD-10-CM | POA: Diagnosis not present

## 2023-07-25 DIAGNOSIS — L89122 Pressure ulcer of left upper back, stage 2: Secondary | ICD-10-CM | POA: Diagnosis not present

## 2023-07-25 DIAGNOSIS — I10 Essential (primary) hypertension: Secondary | ICD-10-CM | POA: Diagnosis not present

## 2023-07-25 DIAGNOSIS — L97422 Non-pressure chronic ulcer of left heel and midfoot with fat layer exposed: Secondary | ICD-10-CM | POA: Diagnosis not present

## 2023-07-25 DIAGNOSIS — J188 Other pneumonia, unspecified organism: Secondary | ICD-10-CM | POA: Diagnosis not present

## 2023-07-28 DIAGNOSIS — R339 Retention of urine, unspecified: Secondary | ICD-10-CM | POA: Diagnosis not present

## 2023-07-28 DIAGNOSIS — I4891 Unspecified atrial fibrillation: Secondary | ICD-10-CM | POA: Diagnosis not present

## 2023-07-28 DIAGNOSIS — J188 Other pneumonia, unspecified organism: Secondary | ICD-10-CM | POA: Diagnosis not present

## 2023-07-28 DIAGNOSIS — E11649 Type 2 diabetes mellitus with hypoglycemia without coma: Secondary | ICD-10-CM | POA: Diagnosis not present

## 2023-07-28 DIAGNOSIS — Z87891 Personal history of nicotine dependence: Secondary | ICD-10-CM | POA: Diagnosis not present

## 2023-07-28 DIAGNOSIS — L89122 Pressure ulcer of left upper back, stage 2: Secondary | ICD-10-CM | POA: Diagnosis not present

## 2023-07-28 DIAGNOSIS — I10 Essential (primary) hypertension: Secondary | ICD-10-CM | POA: Diagnosis not present

## 2023-07-28 DIAGNOSIS — Z9181 History of falling: Secondary | ICD-10-CM | POA: Diagnosis not present

## 2023-07-28 DIAGNOSIS — Z466 Encounter for fitting and adjustment of urinary device: Secondary | ICD-10-CM | POA: Diagnosis not present

## 2023-07-28 DIAGNOSIS — Z794 Long term (current) use of insulin: Secondary | ICD-10-CM | POA: Diagnosis not present

## 2023-07-28 DIAGNOSIS — Z7901 Long term (current) use of anticoagulants: Secondary | ICD-10-CM | POA: Diagnosis not present

## 2023-07-28 DIAGNOSIS — L97422 Non-pressure chronic ulcer of left heel and midfoot with fat layer exposed: Secondary | ICD-10-CM | POA: Diagnosis not present

## 2023-07-28 DIAGNOSIS — E876 Hypokalemia: Secondary | ICD-10-CM | POA: Diagnosis not present

## 2023-07-28 DIAGNOSIS — J189 Pneumonia, unspecified organism: Secondary | ICD-10-CM | POA: Diagnosis not present

## 2023-07-28 DIAGNOSIS — E11621 Type 2 diabetes mellitus with foot ulcer: Secondary | ICD-10-CM | POA: Diagnosis not present

## 2023-07-28 DIAGNOSIS — Z7984 Long term (current) use of oral hypoglycemic drugs: Secondary | ICD-10-CM | POA: Diagnosis not present

## 2023-07-30 DIAGNOSIS — M4626 Osteomyelitis of vertebra, lumbar region: Secondary | ICD-10-CM | POA: Diagnosis not present

## 2023-07-30 DIAGNOSIS — M6281 Muscle weakness (generalized): Secondary | ICD-10-CM | POA: Diagnosis not present

## 2023-07-30 DIAGNOSIS — R600 Localized edema: Secondary | ICD-10-CM | POA: Diagnosis not present

## 2023-07-30 DIAGNOSIS — E785 Hyperlipidemia, unspecified: Secondary | ICD-10-CM | POA: Diagnosis not present

## 2023-07-30 DIAGNOSIS — J984 Other disorders of lung: Secondary | ICD-10-CM | POA: Diagnosis not present

## 2023-07-30 DIAGNOSIS — I509 Heart failure, unspecified: Secondary | ICD-10-CM | POA: Diagnosis not present

## 2023-07-30 DIAGNOSIS — E119 Type 2 diabetes mellitus without complications: Secondary | ICD-10-CM | POA: Diagnosis not present

## 2023-07-31 ENCOUNTER — Encounter: Payer: Self-pay | Admitting: Urology

## 2023-07-31 ENCOUNTER — Ambulatory Visit (INDEPENDENT_AMBULATORY_CARE_PROVIDER_SITE_OTHER): Payer: Medicare HMO | Admitting: Urology

## 2023-07-31 VITALS — BP 108/64 | HR 96

## 2023-07-31 DIAGNOSIS — E11621 Type 2 diabetes mellitus with foot ulcer: Secondary | ICD-10-CM | POA: Diagnosis not present

## 2023-07-31 DIAGNOSIS — R338 Other retention of urine: Secondary | ICD-10-CM

## 2023-07-31 DIAGNOSIS — Z87891 Personal history of nicotine dependence: Secondary | ICD-10-CM | POA: Diagnosis not present

## 2023-07-31 DIAGNOSIS — I10 Essential (primary) hypertension: Secondary | ICD-10-CM | POA: Diagnosis not present

## 2023-07-31 DIAGNOSIS — L89122 Pressure ulcer of left upper back, stage 2: Secondary | ICD-10-CM | POA: Diagnosis not present

## 2023-07-31 DIAGNOSIS — Z978 Presence of other specified devices: Secondary | ICD-10-CM

## 2023-07-31 DIAGNOSIS — I4891 Unspecified atrial fibrillation: Secondary | ICD-10-CM | POA: Diagnosis not present

## 2023-07-31 DIAGNOSIS — Z466 Encounter for fitting and adjustment of urinary device: Secondary | ICD-10-CM | POA: Diagnosis not present

## 2023-07-31 DIAGNOSIS — J189 Pneumonia, unspecified organism: Secondary | ICD-10-CM | POA: Diagnosis not present

## 2023-07-31 DIAGNOSIS — N401 Enlarged prostate with lower urinary tract symptoms: Secondary | ICD-10-CM | POA: Diagnosis not present

## 2023-07-31 DIAGNOSIS — Z7984 Long term (current) use of oral hypoglycemic drugs: Secondary | ICD-10-CM | POA: Diagnosis not present

## 2023-07-31 DIAGNOSIS — L97422 Non-pressure chronic ulcer of left heel and midfoot with fat layer exposed: Secondary | ICD-10-CM | POA: Diagnosis not present

## 2023-07-31 DIAGNOSIS — E876 Hypokalemia: Secondary | ICD-10-CM | POA: Diagnosis not present

## 2023-07-31 DIAGNOSIS — E11649 Type 2 diabetes mellitus with hypoglycemia without coma: Secondary | ICD-10-CM | POA: Diagnosis not present

## 2023-07-31 DIAGNOSIS — R339 Retention of urine, unspecified: Secondary | ICD-10-CM

## 2023-07-31 DIAGNOSIS — Z9181 History of falling: Secondary | ICD-10-CM | POA: Diagnosis not present

## 2023-07-31 DIAGNOSIS — J188 Other pneumonia, unspecified organism: Secondary | ICD-10-CM | POA: Diagnosis not present

## 2023-07-31 DIAGNOSIS — Z794 Long term (current) use of insulin: Secondary | ICD-10-CM | POA: Diagnosis not present

## 2023-07-31 DIAGNOSIS — Z7901 Long term (current) use of anticoagulants: Secondary | ICD-10-CM | POA: Diagnosis not present

## 2023-07-31 NOTE — Progress Notes (Signed)
 Fill and Pull Catheter Removal  Patient is present today for a catheter removal.  Patient was cleaned and prepped in a sterile fashion of sterile water / saline was instilled into the bladder when the patient felt the urge to urinate. 10ml of water  was then drained from the balloon.  A 16FR foley cath was removed from the bladder no complications were noted .  Patient as then given some time to void on their own.  Patient can void  on their own after some time.  Patient tolerated well.  Performed by: Alan RN  Follow up/ Additional notes: NP in to discuss with patient

## 2023-07-31 NOTE — Progress Notes (Signed)
 Simple Catheter Placement  Due to urinary retention patient is present today for a foley cath placement.  Patient was cleaned and prepped in a sterile fashion with betadine  and 2% lidocaine  jelly was instilled into the urethra. A 16 FR foley catheter was inserted, urine return was noted  , urine was yellow in color.  The balloon was filled with 10cc of sterile water .  A bed bag was attached for drainage. Patient was given instruction on proper catheter care.  Patient tolerated well, no complications were noted   Performed by: Exie CMA  Additional notes/ Follow up: As scheduled

## 2023-08-01 DIAGNOSIS — J188 Other pneumonia, unspecified organism: Secondary | ICD-10-CM | POA: Diagnosis not present

## 2023-08-01 DIAGNOSIS — E11621 Type 2 diabetes mellitus with foot ulcer: Secondary | ICD-10-CM | POA: Diagnosis not present

## 2023-08-01 DIAGNOSIS — Z9181 History of falling: Secondary | ICD-10-CM | POA: Diagnosis not present

## 2023-08-01 DIAGNOSIS — E876 Hypokalemia: Secondary | ICD-10-CM | POA: Diagnosis not present

## 2023-08-01 DIAGNOSIS — Z7984 Long term (current) use of oral hypoglycemic drugs: Secondary | ICD-10-CM | POA: Diagnosis not present

## 2023-08-01 DIAGNOSIS — Z87891 Personal history of nicotine dependence: Secondary | ICD-10-CM | POA: Diagnosis not present

## 2023-08-01 DIAGNOSIS — Z7901 Long term (current) use of anticoagulants: Secondary | ICD-10-CM | POA: Diagnosis not present

## 2023-08-01 DIAGNOSIS — Z466 Encounter for fitting and adjustment of urinary device: Secondary | ICD-10-CM | POA: Diagnosis not present

## 2023-08-01 DIAGNOSIS — J189 Pneumonia, unspecified organism: Secondary | ICD-10-CM | POA: Diagnosis not present

## 2023-08-01 DIAGNOSIS — I4891 Unspecified atrial fibrillation: Secondary | ICD-10-CM | POA: Diagnosis not present

## 2023-08-01 DIAGNOSIS — L97422 Non-pressure chronic ulcer of left heel and midfoot with fat layer exposed: Secondary | ICD-10-CM | POA: Diagnosis not present

## 2023-08-01 DIAGNOSIS — E11649 Type 2 diabetes mellitus with hypoglycemia without coma: Secondary | ICD-10-CM | POA: Diagnosis not present

## 2023-08-01 DIAGNOSIS — L89122 Pressure ulcer of left upper back, stage 2: Secondary | ICD-10-CM | POA: Diagnosis not present

## 2023-08-01 DIAGNOSIS — Z794 Long term (current) use of insulin: Secondary | ICD-10-CM | POA: Diagnosis not present

## 2023-08-01 DIAGNOSIS — R339 Retention of urine, unspecified: Secondary | ICD-10-CM | POA: Diagnosis not present

## 2023-08-01 DIAGNOSIS — I10 Essential (primary) hypertension: Secondary | ICD-10-CM | POA: Diagnosis not present

## 2023-08-04 DIAGNOSIS — L97422 Non-pressure chronic ulcer of left heel and midfoot with fat layer exposed: Secondary | ICD-10-CM | POA: Diagnosis not present

## 2023-08-04 DIAGNOSIS — Z7901 Long term (current) use of anticoagulants: Secondary | ICD-10-CM | POA: Diagnosis not present

## 2023-08-04 DIAGNOSIS — E876 Hypokalemia: Secondary | ICD-10-CM | POA: Diagnosis not present

## 2023-08-04 DIAGNOSIS — E11649 Type 2 diabetes mellitus with hypoglycemia without coma: Secondary | ICD-10-CM | POA: Diagnosis not present

## 2023-08-04 DIAGNOSIS — J188 Other pneumonia, unspecified organism: Secondary | ICD-10-CM | POA: Diagnosis not present

## 2023-08-04 DIAGNOSIS — Z466 Encounter for fitting and adjustment of urinary device: Secondary | ICD-10-CM | POA: Diagnosis not present

## 2023-08-04 DIAGNOSIS — Z7984 Long term (current) use of oral hypoglycemic drugs: Secondary | ICD-10-CM | POA: Diagnosis not present

## 2023-08-04 DIAGNOSIS — L89122 Pressure ulcer of left upper back, stage 2: Secondary | ICD-10-CM | POA: Diagnosis not present

## 2023-08-04 DIAGNOSIS — I4891 Unspecified atrial fibrillation: Secondary | ICD-10-CM | POA: Diagnosis not present

## 2023-08-04 DIAGNOSIS — Z794 Long term (current) use of insulin: Secondary | ICD-10-CM | POA: Diagnosis not present

## 2023-08-04 DIAGNOSIS — Z9181 History of falling: Secondary | ICD-10-CM | POA: Diagnosis not present

## 2023-08-04 DIAGNOSIS — J189 Pneumonia, unspecified organism: Secondary | ICD-10-CM | POA: Diagnosis not present

## 2023-08-04 DIAGNOSIS — R339 Retention of urine, unspecified: Secondary | ICD-10-CM | POA: Diagnosis not present

## 2023-08-04 DIAGNOSIS — I10 Essential (primary) hypertension: Secondary | ICD-10-CM | POA: Diagnosis not present

## 2023-08-04 DIAGNOSIS — E11621 Type 2 diabetes mellitus with foot ulcer: Secondary | ICD-10-CM | POA: Diagnosis not present

## 2023-08-04 DIAGNOSIS — Z87891 Personal history of nicotine dependence: Secondary | ICD-10-CM | POA: Diagnosis not present

## 2023-08-05 DIAGNOSIS — Z7984 Long term (current) use of oral hypoglycemic drugs: Secondary | ICD-10-CM | POA: Diagnosis not present

## 2023-08-05 DIAGNOSIS — Z794 Long term (current) use of insulin: Secondary | ICD-10-CM | POA: Diagnosis not present

## 2023-08-05 DIAGNOSIS — R339 Retention of urine, unspecified: Secondary | ICD-10-CM | POA: Diagnosis not present

## 2023-08-05 DIAGNOSIS — E876 Hypokalemia: Secondary | ICD-10-CM | POA: Diagnosis not present

## 2023-08-05 DIAGNOSIS — L97422 Non-pressure chronic ulcer of left heel and midfoot with fat layer exposed: Secondary | ICD-10-CM | POA: Diagnosis not present

## 2023-08-05 DIAGNOSIS — Z466 Encounter for fitting and adjustment of urinary device: Secondary | ICD-10-CM | POA: Diagnosis not present

## 2023-08-05 DIAGNOSIS — E11649 Type 2 diabetes mellitus with hypoglycemia without coma: Secondary | ICD-10-CM | POA: Diagnosis not present

## 2023-08-05 DIAGNOSIS — L89122 Pressure ulcer of left upper back, stage 2: Secondary | ICD-10-CM | POA: Diagnosis not present

## 2023-08-05 DIAGNOSIS — Z7901 Long term (current) use of anticoagulants: Secondary | ICD-10-CM | POA: Diagnosis not present

## 2023-08-05 DIAGNOSIS — Z87891 Personal history of nicotine dependence: Secondary | ICD-10-CM | POA: Diagnosis not present

## 2023-08-05 DIAGNOSIS — J188 Other pneumonia, unspecified organism: Secondary | ICD-10-CM | POA: Diagnosis not present

## 2023-08-05 DIAGNOSIS — E11621 Type 2 diabetes mellitus with foot ulcer: Secondary | ICD-10-CM | POA: Diagnosis not present

## 2023-08-05 DIAGNOSIS — Z9181 History of falling: Secondary | ICD-10-CM | POA: Diagnosis not present

## 2023-08-05 DIAGNOSIS — I10 Essential (primary) hypertension: Secondary | ICD-10-CM | POA: Diagnosis not present

## 2023-08-05 DIAGNOSIS — I4891 Unspecified atrial fibrillation: Secondary | ICD-10-CM | POA: Diagnosis not present

## 2023-08-05 DIAGNOSIS — J189 Pneumonia, unspecified organism: Secondary | ICD-10-CM | POA: Diagnosis not present

## 2023-08-06 DIAGNOSIS — K219 Gastro-esophageal reflux disease without esophagitis: Secondary | ICD-10-CM | POA: Diagnosis not present

## 2023-08-06 DIAGNOSIS — Z79899 Other long term (current) drug therapy: Secondary | ICD-10-CM | POA: Diagnosis not present

## 2023-08-06 DIAGNOSIS — Z7901 Long term (current) use of anticoagulants: Secondary | ICD-10-CM | POA: Diagnosis not present

## 2023-08-06 DIAGNOSIS — L89103 Pressure ulcer of unspecified part of back, stage 3: Secondary | ICD-10-CM | POA: Diagnosis not present

## 2023-08-06 DIAGNOSIS — Z87891 Personal history of nicotine dependence: Secondary | ICD-10-CM | POA: Diagnosis not present

## 2023-08-06 DIAGNOSIS — Z794 Long term (current) use of insulin: Secondary | ICD-10-CM | POA: Diagnosis not present

## 2023-08-06 DIAGNOSIS — L97422 Non-pressure chronic ulcer of left heel and midfoot with fat layer exposed: Secondary | ICD-10-CM | POA: Diagnosis not present

## 2023-08-06 DIAGNOSIS — L97529 Non-pressure chronic ulcer of other part of left foot with unspecified severity: Secondary | ICD-10-CM | POA: Diagnosis not present

## 2023-08-06 DIAGNOSIS — I10 Essential (primary) hypertension: Secondary | ICD-10-CM | POA: Diagnosis not present

## 2023-08-06 DIAGNOSIS — E11621 Type 2 diabetes mellitus with foot ulcer: Secondary | ICD-10-CM | POA: Diagnosis not present

## 2023-08-07 DIAGNOSIS — J188 Other pneumonia, unspecified organism: Secondary | ICD-10-CM | POA: Diagnosis not present

## 2023-08-07 DIAGNOSIS — Z7901 Long term (current) use of anticoagulants: Secondary | ICD-10-CM | POA: Diagnosis not present

## 2023-08-07 DIAGNOSIS — E11649 Type 2 diabetes mellitus with hypoglycemia without coma: Secondary | ICD-10-CM | POA: Diagnosis not present

## 2023-08-07 DIAGNOSIS — Z794 Long term (current) use of insulin: Secondary | ICD-10-CM | POA: Diagnosis not present

## 2023-08-07 DIAGNOSIS — L89122 Pressure ulcer of left upper back, stage 2: Secondary | ICD-10-CM | POA: Diagnosis not present

## 2023-08-07 DIAGNOSIS — I10 Essential (primary) hypertension: Secondary | ICD-10-CM | POA: Diagnosis not present

## 2023-08-07 DIAGNOSIS — Z87891 Personal history of nicotine dependence: Secondary | ICD-10-CM | POA: Diagnosis not present

## 2023-08-07 DIAGNOSIS — Z9181 History of falling: Secondary | ICD-10-CM | POA: Diagnosis not present

## 2023-08-07 DIAGNOSIS — E876 Hypokalemia: Secondary | ICD-10-CM | POA: Diagnosis not present

## 2023-08-07 DIAGNOSIS — R339 Retention of urine, unspecified: Secondary | ICD-10-CM | POA: Diagnosis not present

## 2023-08-07 DIAGNOSIS — E11621 Type 2 diabetes mellitus with foot ulcer: Secondary | ICD-10-CM | POA: Diagnosis not present

## 2023-08-07 DIAGNOSIS — L97422 Non-pressure chronic ulcer of left heel and midfoot with fat layer exposed: Secondary | ICD-10-CM | POA: Diagnosis not present

## 2023-08-07 DIAGNOSIS — I4891 Unspecified atrial fibrillation: Secondary | ICD-10-CM | POA: Diagnosis not present

## 2023-08-07 DIAGNOSIS — J189 Pneumonia, unspecified organism: Secondary | ICD-10-CM | POA: Diagnosis not present

## 2023-08-07 DIAGNOSIS — Z7984 Long term (current) use of oral hypoglycemic drugs: Secondary | ICD-10-CM | POA: Diagnosis not present

## 2023-08-07 DIAGNOSIS — Z466 Encounter for fitting and adjustment of urinary device: Secondary | ICD-10-CM | POA: Diagnosis not present

## 2023-08-11 DIAGNOSIS — J189 Pneumonia, unspecified organism: Secondary | ICD-10-CM | POA: Diagnosis not present

## 2023-08-11 DIAGNOSIS — L89122 Pressure ulcer of left upper back, stage 2: Secondary | ICD-10-CM | POA: Diagnosis not present

## 2023-08-11 DIAGNOSIS — Z9181 History of falling: Secondary | ICD-10-CM | POA: Diagnosis not present

## 2023-08-11 DIAGNOSIS — J188 Other pneumonia, unspecified organism: Secondary | ICD-10-CM | POA: Diagnosis not present

## 2023-08-11 DIAGNOSIS — Z794 Long term (current) use of insulin: Secondary | ICD-10-CM | POA: Diagnosis not present

## 2023-08-11 DIAGNOSIS — E11621 Type 2 diabetes mellitus with foot ulcer: Secondary | ICD-10-CM | POA: Diagnosis not present

## 2023-08-11 DIAGNOSIS — I10 Essential (primary) hypertension: Secondary | ICD-10-CM | POA: Diagnosis not present

## 2023-08-11 DIAGNOSIS — E11649 Type 2 diabetes mellitus with hypoglycemia without coma: Secondary | ICD-10-CM | POA: Diagnosis not present

## 2023-08-11 DIAGNOSIS — L97422 Non-pressure chronic ulcer of left heel and midfoot with fat layer exposed: Secondary | ICD-10-CM | POA: Diagnosis not present

## 2023-08-11 DIAGNOSIS — I4891 Unspecified atrial fibrillation: Secondary | ICD-10-CM | POA: Diagnosis not present

## 2023-08-11 DIAGNOSIS — Z7901 Long term (current) use of anticoagulants: Secondary | ICD-10-CM | POA: Diagnosis not present

## 2023-08-11 DIAGNOSIS — Z7984 Long term (current) use of oral hypoglycemic drugs: Secondary | ICD-10-CM | POA: Diagnosis not present

## 2023-08-11 DIAGNOSIS — E876 Hypokalemia: Secondary | ICD-10-CM | POA: Diagnosis not present

## 2023-08-11 DIAGNOSIS — Z466 Encounter for fitting and adjustment of urinary device: Secondary | ICD-10-CM | POA: Diagnosis not present

## 2023-08-11 DIAGNOSIS — R339 Retention of urine, unspecified: Secondary | ICD-10-CM | POA: Diagnosis not present

## 2023-08-11 DIAGNOSIS — Z87891 Personal history of nicotine dependence: Secondary | ICD-10-CM | POA: Diagnosis not present

## 2023-08-12 DIAGNOSIS — Z87891 Personal history of nicotine dependence: Secondary | ICD-10-CM | POA: Diagnosis not present

## 2023-08-12 DIAGNOSIS — E876 Hypokalemia: Secondary | ICD-10-CM | POA: Diagnosis not present

## 2023-08-12 DIAGNOSIS — J189 Pneumonia, unspecified organism: Secondary | ICD-10-CM | POA: Diagnosis not present

## 2023-08-12 DIAGNOSIS — J188 Other pneumonia, unspecified organism: Secondary | ICD-10-CM | POA: Diagnosis not present

## 2023-08-12 DIAGNOSIS — E11621 Type 2 diabetes mellitus with foot ulcer: Secondary | ICD-10-CM | POA: Diagnosis not present

## 2023-08-12 DIAGNOSIS — E11649 Type 2 diabetes mellitus with hypoglycemia without coma: Secondary | ICD-10-CM | POA: Diagnosis not present

## 2023-08-12 DIAGNOSIS — Z7984 Long term (current) use of oral hypoglycemic drugs: Secondary | ICD-10-CM | POA: Diagnosis not present

## 2023-08-12 DIAGNOSIS — L89122 Pressure ulcer of left upper back, stage 2: Secondary | ICD-10-CM | POA: Diagnosis not present

## 2023-08-12 DIAGNOSIS — Z9181 History of falling: Secondary | ICD-10-CM | POA: Diagnosis not present

## 2023-08-12 DIAGNOSIS — I4891 Unspecified atrial fibrillation: Secondary | ICD-10-CM | POA: Diagnosis not present

## 2023-08-12 DIAGNOSIS — R339 Retention of urine, unspecified: Secondary | ICD-10-CM | POA: Diagnosis not present

## 2023-08-12 DIAGNOSIS — L97422 Non-pressure chronic ulcer of left heel and midfoot with fat layer exposed: Secondary | ICD-10-CM | POA: Diagnosis not present

## 2023-08-12 DIAGNOSIS — Z794 Long term (current) use of insulin: Secondary | ICD-10-CM | POA: Diagnosis not present

## 2023-08-12 DIAGNOSIS — Z7901 Long term (current) use of anticoagulants: Secondary | ICD-10-CM | POA: Diagnosis not present

## 2023-08-12 DIAGNOSIS — Z466 Encounter for fitting and adjustment of urinary device: Secondary | ICD-10-CM | POA: Diagnosis not present

## 2023-08-12 DIAGNOSIS — I10 Essential (primary) hypertension: Secondary | ICD-10-CM | POA: Diagnosis not present

## 2023-08-13 DIAGNOSIS — Z7984 Long term (current) use of oral hypoglycemic drugs: Secondary | ICD-10-CM | POA: Diagnosis not present

## 2023-08-13 DIAGNOSIS — I4891 Unspecified atrial fibrillation: Secondary | ICD-10-CM | POA: Diagnosis not present

## 2023-08-13 DIAGNOSIS — Z9181 History of falling: Secondary | ICD-10-CM | POA: Diagnosis not present

## 2023-08-13 DIAGNOSIS — E11621 Type 2 diabetes mellitus with foot ulcer: Secondary | ICD-10-CM | POA: Diagnosis not present

## 2023-08-13 DIAGNOSIS — Z7901 Long term (current) use of anticoagulants: Secondary | ICD-10-CM | POA: Diagnosis not present

## 2023-08-13 DIAGNOSIS — Z87891 Personal history of nicotine dependence: Secondary | ICD-10-CM | POA: Diagnosis not present

## 2023-08-13 DIAGNOSIS — R339 Retention of urine, unspecified: Secondary | ICD-10-CM | POA: Diagnosis not present

## 2023-08-13 DIAGNOSIS — Z794 Long term (current) use of insulin: Secondary | ICD-10-CM | POA: Diagnosis not present

## 2023-08-13 DIAGNOSIS — E11649 Type 2 diabetes mellitus with hypoglycemia without coma: Secondary | ICD-10-CM | POA: Diagnosis not present

## 2023-08-13 DIAGNOSIS — J188 Other pneumonia, unspecified organism: Secondary | ICD-10-CM | POA: Diagnosis not present

## 2023-08-13 DIAGNOSIS — L89122 Pressure ulcer of left upper back, stage 2: Secondary | ICD-10-CM | POA: Diagnosis not present

## 2023-08-13 DIAGNOSIS — I10 Essential (primary) hypertension: Secondary | ICD-10-CM | POA: Diagnosis not present

## 2023-08-13 DIAGNOSIS — J189 Pneumonia, unspecified organism: Secondary | ICD-10-CM | POA: Diagnosis not present

## 2023-08-13 DIAGNOSIS — L97422 Non-pressure chronic ulcer of left heel and midfoot with fat layer exposed: Secondary | ICD-10-CM | POA: Diagnosis not present

## 2023-08-13 DIAGNOSIS — E876 Hypokalemia: Secondary | ICD-10-CM | POA: Diagnosis not present

## 2023-08-13 DIAGNOSIS — Z466 Encounter for fitting and adjustment of urinary device: Secondary | ICD-10-CM | POA: Diagnosis not present

## 2023-08-14 DIAGNOSIS — E11621 Type 2 diabetes mellitus with foot ulcer: Secondary | ICD-10-CM | POA: Diagnosis not present

## 2023-08-14 DIAGNOSIS — R339 Retention of urine, unspecified: Secondary | ICD-10-CM | POA: Diagnosis not present

## 2023-08-14 DIAGNOSIS — K219 Gastro-esophageal reflux disease without esophagitis: Secondary | ICD-10-CM | POA: Diagnosis not present

## 2023-08-14 DIAGNOSIS — I10 Essential (primary) hypertension: Secondary | ICD-10-CM | POA: Diagnosis not present

## 2023-08-14 DIAGNOSIS — E876 Hypokalemia: Secondary | ICD-10-CM | POA: Diagnosis not present

## 2023-08-14 DIAGNOSIS — Z7901 Long term (current) use of anticoagulants: Secondary | ICD-10-CM | POA: Diagnosis not present

## 2023-08-14 DIAGNOSIS — Z87891 Personal history of nicotine dependence: Secondary | ICD-10-CM | POA: Diagnosis not present

## 2023-08-14 DIAGNOSIS — L97529 Non-pressure chronic ulcer of other part of left foot with unspecified severity: Secondary | ICD-10-CM | POA: Diagnosis not present

## 2023-08-14 DIAGNOSIS — J188 Other pneumonia, unspecified organism: Secondary | ICD-10-CM | POA: Diagnosis not present

## 2023-08-14 DIAGNOSIS — Z79899 Other long term (current) drug therapy: Secondary | ICD-10-CM | POA: Diagnosis not present

## 2023-08-14 DIAGNOSIS — L89122 Pressure ulcer of left upper back, stage 2: Secondary | ICD-10-CM | POA: Diagnosis not present

## 2023-08-14 DIAGNOSIS — J189 Pneumonia, unspecified organism: Secondary | ICD-10-CM | POA: Diagnosis not present

## 2023-08-14 DIAGNOSIS — E11649 Type 2 diabetes mellitus with hypoglycemia without coma: Secondary | ICD-10-CM | POA: Diagnosis not present

## 2023-08-14 DIAGNOSIS — Z794 Long term (current) use of insulin: Secondary | ICD-10-CM | POA: Diagnosis not present

## 2023-08-14 DIAGNOSIS — I4891 Unspecified atrial fibrillation: Secondary | ICD-10-CM | POA: Diagnosis not present

## 2023-08-14 DIAGNOSIS — Z466 Encounter for fitting and adjustment of urinary device: Secondary | ICD-10-CM | POA: Diagnosis not present

## 2023-08-14 DIAGNOSIS — Z9181 History of falling: Secondary | ICD-10-CM | POA: Diagnosis not present

## 2023-08-14 DIAGNOSIS — L89103 Pressure ulcer of unspecified part of back, stage 3: Secondary | ICD-10-CM | POA: Diagnosis not present

## 2023-08-14 DIAGNOSIS — Z7984 Long term (current) use of oral hypoglycemic drugs: Secondary | ICD-10-CM | POA: Diagnosis not present

## 2023-08-14 DIAGNOSIS — L97422 Non-pressure chronic ulcer of left heel and midfoot with fat layer exposed: Secondary | ICD-10-CM | POA: Diagnosis not present

## 2023-08-18 DIAGNOSIS — Z87891 Personal history of nicotine dependence: Secondary | ICD-10-CM | POA: Diagnosis not present

## 2023-08-18 DIAGNOSIS — I4891 Unspecified atrial fibrillation: Secondary | ICD-10-CM | POA: Diagnosis not present

## 2023-08-18 DIAGNOSIS — Z794 Long term (current) use of insulin: Secondary | ICD-10-CM | POA: Diagnosis not present

## 2023-08-18 DIAGNOSIS — J189 Pneumonia, unspecified organism: Secondary | ICD-10-CM | POA: Diagnosis not present

## 2023-08-18 DIAGNOSIS — E11621 Type 2 diabetes mellitus with foot ulcer: Secondary | ICD-10-CM | POA: Diagnosis not present

## 2023-08-18 DIAGNOSIS — J188 Other pneumonia, unspecified organism: Secondary | ICD-10-CM | POA: Diagnosis not present

## 2023-08-18 DIAGNOSIS — Z466 Encounter for fitting and adjustment of urinary device: Secondary | ICD-10-CM | POA: Diagnosis not present

## 2023-08-18 DIAGNOSIS — I10 Essential (primary) hypertension: Secondary | ICD-10-CM | POA: Diagnosis not present

## 2023-08-18 DIAGNOSIS — Z9181 History of falling: Secondary | ICD-10-CM | POA: Diagnosis not present

## 2023-08-18 DIAGNOSIS — Z7901 Long term (current) use of anticoagulants: Secondary | ICD-10-CM | POA: Diagnosis not present

## 2023-08-18 DIAGNOSIS — L89122 Pressure ulcer of left upper back, stage 2: Secondary | ICD-10-CM | POA: Diagnosis not present

## 2023-08-18 DIAGNOSIS — E876 Hypokalemia: Secondary | ICD-10-CM | POA: Diagnosis not present

## 2023-08-18 DIAGNOSIS — Z7984 Long term (current) use of oral hypoglycemic drugs: Secondary | ICD-10-CM | POA: Diagnosis not present

## 2023-08-18 DIAGNOSIS — L97422 Non-pressure chronic ulcer of left heel and midfoot with fat layer exposed: Secondary | ICD-10-CM | POA: Diagnosis not present

## 2023-08-18 DIAGNOSIS — E11649 Type 2 diabetes mellitus with hypoglycemia without coma: Secondary | ICD-10-CM | POA: Diagnosis not present

## 2023-08-18 DIAGNOSIS — R339 Retention of urine, unspecified: Secondary | ICD-10-CM | POA: Diagnosis not present

## 2023-08-20 DIAGNOSIS — Z794 Long term (current) use of insulin: Secondary | ICD-10-CM | POA: Diagnosis not present

## 2023-08-20 DIAGNOSIS — K219 Gastro-esophageal reflux disease without esophagitis: Secondary | ICD-10-CM | POA: Diagnosis not present

## 2023-08-20 DIAGNOSIS — I10 Essential (primary) hypertension: Secondary | ICD-10-CM | POA: Diagnosis not present

## 2023-08-20 DIAGNOSIS — Z7901 Long term (current) use of anticoagulants: Secondary | ICD-10-CM | POA: Diagnosis not present

## 2023-08-20 DIAGNOSIS — L89103 Pressure ulcer of unspecified part of back, stage 3: Secondary | ICD-10-CM | POA: Diagnosis not present

## 2023-08-20 DIAGNOSIS — L97429 Non-pressure chronic ulcer of left heel and midfoot with unspecified severity: Secondary | ICD-10-CM | POA: Diagnosis not present

## 2023-08-20 DIAGNOSIS — Z87891 Personal history of nicotine dependence: Secondary | ICD-10-CM | POA: Diagnosis not present

## 2023-08-20 DIAGNOSIS — L97529 Non-pressure chronic ulcer of other part of left foot with unspecified severity: Secondary | ICD-10-CM | POA: Diagnosis not present

## 2023-08-20 DIAGNOSIS — E11621 Type 2 diabetes mellitus with foot ulcer: Secondary | ICD-10-CM | POA: Diagnosis not present

## 2023-08-21 DIAGNOSIS — Z9181 History of falling: Secondary | ICD-10-CM | POA: Diagnosis not present

## 2023-08-21 DIAGNOSIS — L89122 Pressure ulcer of left upper back, stage 2: Secondary | ICD-10-CM | POA: Diagnosis not present

## 2023-08-21 DIAGNOSIS — J189 Pneumonia, unspecified organism: Secondary | ICD-10-CM | POA: Diagnosis not present

## 2023-08-21 DIAGNOSIS — Z466 Encounter for fitting and adjustment of urinary device: Secondary | ICD-10-CM | POA: Diagnosis not present

## 2023-08-21 DIAGNOSIS — J188 Other pneumonia, unspecified organism: Secondary | ICD-10-CM | POA: Diagnosis not present

## 2023-08-21 DIAGNOSIS — Z7901 Long term (current) use of anticoagulants: Secondary | ICD-10-CM | POA: Diagnosis not present

## 2023-08-21 DIAGNOSIS — Z7984 Long term (current) use of oral hypoglycemic drugs: Secondary | ICD-10-CM | POA: Diagnosis not present

## 2023-08-21 DIAGNOSIS — Z87891 Personal history of nicotine dependence: Secondary | ICD-10-CM | POA: Diagnosis not present

## 2023-08-21 DIAGNOSIS — I10 Essential (primary) hypertension: Secondary | ICD-10-CM | POA: Diagnosis not present

## 2023-08-21 DIAGNOSIS — R339 Retention of urine, unspecified: Secondary | ICD-10-CM | POA: Diagnosis not present

## 2023-08-21 DIAGNOSIS — E11621 Type 2 diabetes mellitus with foot ulcer: Secondary | ICD-10-CM | POA: Diagnosis not present

## 2023-08-21 DIAGNOSIS — E876 Hypokalemia: Secondary | ICD-10-CM | POA: Diagnosis not present

## 2023-08-21 DIAGNOSIS — E11649 Type 2 diabetes mellitus with hypoglycemia without coma: Secondary | ICD-10-CM | POA: Diagnosis not present

## 2023-08-21 DIAGNOSIS — L97422 Non-pressure chronic ulcer of left heel and midfoot with fat layer exposed: Secondary | ICD-10-CM | POA: Diagnosis not present

## 2023-08-21 DIAGNOSIS — Z794 Long term (current) use of insulin: Secondary | ICD-10-CM | POA: Diagnosis not present

## 2023-08-21 DIAGNOSIS — I4891 Unspecified atrial fibrillation: Secondary | ICD-10-CM | POA: Diagnosis not present

## 2023-08-25 DIAGNOSIS — J189 Pneumonia, unspecified organism: Secondary | ICD-10-CM | POA: Diagnosis not present

## 2023-08-25 DIAGNOSIS — E11649 Type 2 diabetes mellitus with hypoglycemia without coma: Secondary | ICD-10-CM | POA: Diagnosis not present

## 2023-08-25 DIAGNOSIS — J188 Other pneumonia, unspecified organism: Secondary | ICD-10-CM | POA: Diagnosis not present

## 2023-08-25 DIAGNOSIS — Z87891 Personal history of nicotine dependence: Secondary | ICD-10-CM | POA: Diagnosis not present

## 2023-08-25 DIAGNOSIS — E11621 Type 2 diabetes mellitus with foot ulcer: Secondary | ICD-10-CM | POA: Diagnosis not present

## 2023-08-25 DIAGNOSIS — L89122 Pressure ulcer of left upper back, stage 2: Secondary | ICD-10-CM | POA: Diagnosis not present

## 2023-08-25 DIAGNOSIS — L97422 Non-pressure chronic ulcer of left heel and midfoot with fat layer exposed: Secondary | ICD-10-CM | POA: Diagnosis not present

## 2023-08-25 DIAGNOSIS — I10 Essential (primary) hypertension: Secondary | ICD-10-CM | POA: Diagnosis not present

## 2023-08-25 DIAGNOSIS — Z466 Encounter for fitting and adjustment of urinary device: Secondary | ICD-10-CM | POA: Diagnosis not present

## 2023-08-25 DIAGNOSIS — E876 Hypokalemia: Secondary | ICD-10-CM | POA: Diagnosis not present

## 2023-08-25 DIAGNOSIS — Z794 Long term (current) use of insulin: Secondary | ICD-10-CM | POA: Diagnosis not present

## 2023-08-25 DIAGNOSIS — Z7901 Long term (current) use of anticoagulants: Secondary | ICD-10-CM | POA: Diagnosis not present

## 2023-08-25 DIAGNOSIS — R339 Retention of urine, unspecified: Secondary | ICD-10-CM | POA: Diagnosis not present

## 2023-08-25 DIAGNOSIS — Z9181 History of falling: Secondary | ICD-10-CM | POA: Diagnosis not present

## 2023-08-25 DIAGNOSIS — Z7984 Long term (current) use of oral hypoglycemic drugs: Secondary | ICD-10-CM | POA: Diagnosis not present

## 2023-08-25 DIAGNOSIS — I4891 Unspecified atrial fibrillation: Secondary | ICD-10-CM | POA: Diagnosis not present

## 2023-08-26 DIAGNOSIS — Z466 Encounter for fitting and adjustment of urinary device: Secondary | ICD-10-CM | POA: Diagnosis not present

## 2023-08-26 DIAGNOSIS — L89122 Pressure ulcer of left upper back, stage 2: Secondary | ICD-10-CM | POA: Diagnosis not present

## 2023-08-26 DIAGNOSIS — Z87891 Personal history of nicotine dependence: Secondary | ICD-10-CM | POA: Diagnosis not present

## 2023-08-26 DIAGNOSIS — Z794 Long term (current) use of insulin: Secondary | ICD-10-CM | POA: Diagnosis not present

## 2023-08-26 DIAGNOSIS — E876 Hypokalemia: Secondary | ICD-10-CM | POA: Diagnosis not present

## 2023-08-26 DIAGNOSIS — Z7901 Long term (current) use of anticoagulants: Secondary | ICD-10-CM | POA: Diagnosis not present

## 2023-08-26 DIAGNOSIS — I10 Essential (primary) hypertension: Secondary | ICD-10-CM | POA: Diagnosis not present

## 2023-08-26 DIAGNOSIS — I4891 Unspecified atrial fibrillation: Secondary | ICD-10-CM | POA: Diagnosis not present

## 2023-08-26 DIAGNOSIS — Z9181 History of falling: Secondary | ICD-10-CM | POA: Diagnosis not present

## 2023-08-26 DIAGNOSIS — E11621 Type 2 diabetes mellitus with foot ulcer: Secondary | ICD-10-CM | POA: Diagnosis not present

## 2023-08-26 DIAGNOSIS — L97422 Non-pressure chronic ulcer of left heel and midfoot with fat layer exposed: Secondary | ICD-10-CM | POA: Diagnosis not present

## 2023-08-26 DIAGNOSIS — E11649 Type 2 diabetes mellitus with hypoglycemia without coma: Secondary | ICD-10-CM | POA: Diagnosis not present

## 2023-08-26 DIAGNOSIS — Z7984 Long term (current) use of oral hypoglycemic drugs: Secondary | ICD-10-CM | POA: Diagnosis not present

## 2023-08-26 DIAGNOSIS — R339 Retention of urine, unspecified: Secondary | ICD-10-CM | POA: Diagnosis not present

## 2023-08-26 DIAGNOSIS — J189 Pneumonia, unspecified organism: Secondary | ICD-10-CM | POA: Diagnosis not present

## 2023-08-26 DIAGNOSIS — J188 Other pneumonia, unspecified organism: Secondary | ICD-10-CM | POA: Diagnosis not present

## 2023-08-28 DIAGNOSIS — Z794 Long term (current) use of insulin: Secondary | ICD-10-CM | POA: Diagnosis not present

## 2023-08-28 DIAGNOSIS — Z7901 Long term (current) use of anticoagulants: Secondary | ICD-10-CM | POA: Diagnosis not present

## 2023-08-28 DIAGNOSIS — E11621 Type 2 diabetes mellitus with foot ulcer: Secondary | ICD-10-CM | POA: Diagnosis not present

## 2023-08-28 DIAGNOSIS — I4891 Unspecified atrial fibrillation: Secondary | ICD-10-CM | POA: Diagnosis not present

## 2023-08-28 DIAGNOSIS — L89122 Pressure ulcer of left upper back, stage 2: Secondary | ICD-10-CM | POA: Diagnosis not present

## 2023-08-28 DIAGNOSIS — Z7984 Long term (current) use of oral hypoglycemic drugs: Secondary | ICD-10-CM | POA: Diagnosis not present

## 2023-08-28 DIAGNOSIS — J189 Pneumonia, unspecified organism: Secondary | ICD-10-CM | POA: Diagnosis not present

## 2023-08-28 DIAGNOSIS — I10 Essential (primary) hypertension: Secondary | ICD-10-CM | POA: Diagnosis not present

## 2023-08-28 DIAGNOSIS — Z9181 History of falling: Secondary | ICD-10-CM | POA: Diagnosis not present

## 2023-08-28 DIAGNOSIS — E876 Hypokalemia: Secondary | ICD-10-CM | POA: Diagnosis not present

## 2023-08-28 DIAGNOSIS — Z87891 Personal history of nicotine dependence: Secondary | ICD-10-CM | POA: Diagnosis not present

## 2023-08-28 DIAGNOSIS — L97422 Non-pressure chronic ulcer of left heel and midfoot with fat layer exposed: Secondary | ICD-10-CM | POA: Diagnosis not present

## 2023-08-28 DIAGNOSIS — J188 Other pneumonia, unspecified organism: Secondary | ICD-10-CM | POA: Diagnosis not present

## 2023-08-28 DIAGNOSIS — Z466 Encounter for fitting and adjustment of urinary device: Secondary | ICD-10-CM | POA: Diagnosis not present

## 2023-08-28 DIAGNOSIS — R339 Retention of urine, unspecified: Secondary | ICD-10-CM | POA: Diagnosis not present

## 2023-08-28 DIAGNOSIS — E11649 Type 2 diabetes mellitus with hypoglycemia without coma: Secondary | ICD-10-CM | POA: Diagnosis not present

## 2023-08-30 DIAGNOSIS — M6281 Muscle weakness (generalized): Secondary | ICD-10-CM | POA: Diagnosis not present

## 2023-08-30 DIAGNOSIS — E119 Type 2 diabetes mellitus without complications: Secondary | ICD-10-CM | POA: Diagnosis not present

## 2023-08-30 DIAGNOSIS — J984 Other disorders of lung: Secondary | ICD-10-CM | POA: Diagnosis not present

## 2023-08-30 DIAGNOSIS — E785 Hyperlipidemia, unspecified: Secondary | ICD-10-CM | POA: Diagnosis not present

## 2023-08-30 DIAGNOSIS — R600 Localized edema: Secondary | ICD-10-CM | POA: Diagnosis not present

## 2023-08-30 DIAGNOSIS — I509 Heart failure, unspecified: Secondary | ICD-10-CM | POA: Diagnosis not present

## 2023-08-30 DIAGNOSIS — M4626 Osteomyelitis of vertebra, lumbar region: Secondary | ICD-10-CM | POA: Diagnosis not present

## 2023-09-01 DIAGNOSIS — J188 Other pneumonia, unspecified organism: Secondary | ICD-10-CM | POA: Diagnosis not present

## 2023-09-01 DIAGNOSIS — L97422 Non-pressure chronic ulcer of left heel and midfoot with fat layer exposed: Secondary | ICD-10-CM | POA: Diagnosis not present

## 2023-09-01 DIAGNOSIS — R339 Retention of urine, unspecified: Secondary | ICD-10-CM | POA: Diagnosis not present

## 2023-09-01 DIAGNOSIS — L89122 Pressure ulcer of left upper back, stage 2: Secondary | ICD-10-CM | POA: Diagnosis not present

## 2023-09-01 DIAGNOSIS — Z9181 History of falling: Secondary | ICD-10-CM | POA: Diagnosis not present

## 2023-09-01 DIAGNOSIS — J189 Pneumonia, unspecified organism: Secondary | ICD-10-CM | POA: Diagnosis not present

## 2023-09-01 DIAGNOSIS — E11621 Type 2 diabetes mellitus with foot ulcer: Secondary | ICD-10-CM | POA: Diagnosis not present

## 2023-09-01 DIAGNOSIS — Z87891 Personal history of nicotine dependence: Secondary | ICD-10-CM | POA: Diagnosis not present

## 2023-09-01 DIAGNOSIS — Z7901 Long term (current) use of anticoagulants: Secondary | ICD-10-CM | POA: Diagnosis not present

## 2023-09-01 DIAGNOSIS — E876 Hypokalemia: Secondary | ICD-10-CM | POA: Diagnosis not present

## 2023-09-01 DIAGNOSIS — Z794 Long term (current) use of insulin: Secondary | ICD-10-CM | POA: Diagnosis not present

## 2023-09-01 DIAGNOSIS — I10 Essential (primary) hypertension: Secondary | ICD-10-CM | POA: Diagnosis not present

## 2023-09-01 DIAGNOSIS — Z466 Encounter for fitting and adjustment of urinary device: Secondary | ICD-10-CM | POA: Diagnosis not present

## 2023-09-01 DIAGNOSIS — Z7984 Long term (current) use of oral hypoglycemic drugs: Secondary | ICD-10-CM | POA: Diagnosis not present

## 2023-09-01 DIAGNOSIS — E11649 Type 2 diabetes mellitus with hypoglycemia without coma: Secondary | ICD-10-CM | POA: Diagnosis not present

## 2023-09-01 DIAGNOSIS — I4891 Unspecified atrial fibrillation: Secondary | ICD-10-CM | POA: Diagnosis not present

## 2023-09-02 DIAGNOSIS — E876 Hypokalemia: Secondary | ICD-10-CM | POA: Diagnosis not present

## 2023-09-02 DIAGNOSIS — I10 Essential (primary) hypertension: Secondary | ICD-10-CM | POA: Diagnosis not present

## 2023-09-02 DIAGNOSIS — Z466 Encounter for fitting and adjustment of urinary device: Secondary | ICD-10-CM | POA: Diagnosis not present

## 2023-09-02 DIAGNOSIS — Z7984 Long term (current) use of oral hypoglycemic drugs: Secondary | ICD-10-CM | POA: Diagnosis not present

## 2023-09-02 DIAGNOSIS — R339 Retention of urine, unspecified: Secondary | ICD-10-CM | POA: Diagnosis not present

## 2023-09-02 DIAGNOSIS — E11621 Type 2 diabetes mellitus with foot ulcer: Secondary | ICD-10-CM | POA: Diagnosis not present

## 2023-09-02 DIAGNOSIS — L97422 Non-pressure chronic ulcer of left heel and midfoot with fat layer exposed: Secondary | ICD-10-CM | POA: Diagnosis not present

## 2023-09-02 DIAGNOSIS — Z9181 History of falling: Secondary | ICD-10-CM | POA: Diagnosis not present

## 2023-09-02 DIAGNOSIS — J188 Other pneumonia, unspecified organism: Secondary | ICD-10-CM | POA: Diagnosis not present

## 2023-09-02 DIAGNOSIS — Z87891 Personal history of nicotine dependence: Secondary | ICD-10-CM | POA: Diagnosis not present

## 2023-09-02 DIAGNOSIS — Z7901 Long term (current) use of anticoagulants: Secondary | ICD-10-CM | POA: Diagnosis not present

## 2023-09-02 DIAGNOSIS — I4891 Unspecified atrial fibrillation: Secondary | ICD-10-CM | POA: Diagnosis not present

## 2023-09-02 DIAGNOSIS — E11649 Type 2 diabetes mellitus with hypoglycemia without coma: Secondary | ICD-10-CM | POA: Diagnosis not present

## 2023-09-02 DIAGNOSIS — L89122 Pressure ulcer of left upper back, stage 2: Secondary | ICD-10-CM | POA: Diagnosis not present

## 2023-09-02 DIAGNOSIS — Z794 Long term (current) use of insulin: Secondary | ICD-10-CM | POA: Diagnosis not present

## 2023-09-02 DIAGNOSIS — J189 Pneumonia, unspecified organism: Secondary | ICD-10-CM | POA: Diagnosis not present

## 2023-09-03 ENCOUNTER — Ambulatory Visit (INDEPENDENT_AMBULATORY_CARE_PROVIDER_SITE_OTHER): Payer: Medicare HMO

## 2023-09-03 DIAGNOSIS — Z7901 Long term (current) use of anticoagulants: Secondary | ICD-10-CM | POA: Diagnosis not present

## 2023-09-03 DIAGNOSIS — L97429 Non-pressure chronic ulcer of left heel and midfoot with unspecified severity: Secondary | ICD-10-CM | POA: Diagnosis not present

## 2023-09-03 DIAGNOSIS — L89103 Pressure ulcer of unspecified part of back, stage 3: Secondary | ICD-10-CM | POA: Diagnosis not present

## 2023-09-03 DIAGNOSIS — L97529 Non-pressure chronic ulcer of other part of left foot with unspecified severity: Secondary | ICD-10-CM | POA: Diagnosis not present

## 2023-09-03 DIAGNOSIS — Z87891 Personal history of nicotine dependence: Secondary | ICD-10-CM | POA: Diagnosis not present

## 2023-09-03 DIAGNOSIS — I10 Essential (primary) hypertension: Secondary | ICD-10-CM | POA: Diagnosis not present

## 2023-09-03 DIAGNOSIS — K219 Gastro-esophageal reflux disease without esophagitis: Secondary | ICD-10-CM | POA: Diagnosis not present

## 2023-09-03 DIAGNOSIS — Z794 Long term (current) use of insulin: Secondary | ICD-10-CM | POA: Diagnosis not present

## 2023-09-03 DIAGNOSIS — E11621 Type 2 diabetes mellitus with foot ulcer: Secondary | ICD-10-CM | POA: Diagnosis not present

## 2023-09-03 DIAGNOSIS — Z978 Presence of other specified devices: Secondary | ICD-10-CM

## 2023-09-03 DIAGNOSIS — R339 Retention of urine, unspecified: Secondary | ICD-10-CM

## 2023-09-03 NOTE — Progress Notes (Signed)
 Cath Change/ Replacement  Patient is present today for a catheter change due to urinary retention.  10ml of water  was removed from the balloon, a 16FR foley cath was removed without difficulty.  Patient was cleaned and prepped in a sterile fashion with betadine  and 2% lidocaine  jelly was instilled into the urethra. A 16 FR foley cath was replaced into the bladder, no complications were noted. Urine return was noted 15ml and urine was yellow in color. The balloon was filled with 10ml of sterile water . A bed bag was attached for drainage.    Performed by: Exie DASEN. CMA  Follow up: As scheduled

## 2023-09-05 DIAGNOSIS — Z7901 Long term (current) use of anticoagulants: Secondary | ICD-10-CM | POA: Diagnosis not present

## 2023-09-05 DIAGNOSIS — E11649 Type 2 diabetes mellitus with hypoglycemia without coma: Secondary | ICD-10-CM | POA: Diagnosis not present

## 2023-09-05 DIAGNOSIS — Z7984 Long term (current) use of oral hypoglycemic drugs: Secondary | ICD-10-CM | POA: Diagnosis not present

## 2023-09-05 DIAGNOSIS — J188 Other pneumonia, unspecified organism: Secondary | ICD-10-CM | POA: Diagnosis not present

## 2023-09-05 DIAGNOSIS — E11621 Type 2 diabetes mellitus with foot ulcer: Secondary | ICD-10-CM | POA: Diagnosis not present

## 2023-09-05 DIAGNOSIS — I10 Essential (primary) hypertension: Secondary | ICD-10-CM | POA: Diagnosis not present

## 2023-09-05 DIAGNOSIS — Z87891 Personal history of nicotine dependence: Secondary | ICD-10-CM | POA: Diagnosis not present

## 2023-09-05 DIAGNOSIS — J189 Pneumonia, unspecified organism: Secondary | ICD-10-CM | POA: Diagnosis not present

## 2023-09-05 DIAGNOSIS — I4891 Unspecified atrial fibrillation: Secondary | ICD-10-CM | POA: Diagnosis not present

## 2023-09-05 DIAGNOSIS — L89122 Pressure ulcer of left upper back, stage 2: Secondary | ICD-10-CM | POA: Diagnosis not present

## 2023-09-05 DIAGNOSIS — Z794 Long term (current) use of insulin: Secondary | ICD-10-CM | POA: Diagnosis not present

## 2023-09-05 DIAGNOSIS — Z466 Encounter for fitting and adjustment of urinary device: Secondary | ICD-10-CM | POA: Diagnosis not present

## 2023-09-05 DIAGNOSIS — E876 Hypokalemia: Secondary | ICD-10-CM | POA: Diagnosis not present

## 2023-09-05 DIAGNOSIS — Z9181 History of falling: Secondary | ICD-10-CM | POA: Diagnosis not present

## 2023-09-05 DIAGNOSIS — L97422 Non-pressure chronic ulcer of left heel and midfoot with fat layer exposed: Secondary | ICD-10-CM | POA: Diagnosis not present

## 2023-09-05 DIAGNOSIS — R339 Retention of urine, unspecified: Secondary | ICD-10-CM | POA: Diagnosis not present

## 2023-09-08 DIAGNOSIS — Z466 Encounter for fitting and adjustment of urinary device: Secondary | ICD-10-CM | POA: Diagnosis not present

## 2023-09-08 DIAGNOSIS — R339 Retention of urine, unspecified: Secondary | ICD-10-CM | POA: Diagnosis not present

## 2023-09-08 DIAGNOSIS — L89122 Pressure ulcer of left upper back, stage 2: Secondary | ICD-10-CM | POA: Diagnosis not present

## 2023-09-08 DIAGNOSIS — E11649 Type 2 diabetes mellitus with hypoglycemia without coma: Secondary | ICD-10-CM | POA: Diagnosis not present

## 2023-09-08 DIAGNOSIS — L97422 Non-pressure chronic ulcer of left heel and midfoot with fat layer exposed: Secondary | ICD-10-CM | POA: Diagnosis not present

## 2023-09-08 DIAGNOSIS — Z794 Long term (current) use of insulin: Secondary | ICD-10-CM | POA: Diagnosis not present

## 2023-09-08 DIAGNOSIS — Z7984 Long term (current) use of oral hypoglycemic drugs: Secondary | ICD-10-CM | POA: Diagnosis not present

## 2023-09-08 DIAGNOSIS — Z9181 History of falling: Secondary | ICD-10-CM | POA: Diagnosis not present

## 2023-09-08 DIAGNOSIS — I4891 Unspecified atrial fibrillation: Secondary | ICD-10-CM | POA: Diagnosis not present

## 2023-09-08 DIAGNOSIS — Z87891 Personal history of nicotine dependence: Secondary | ICD-10-CM | POA: Diagnosis not present

## 2023-09-08 DIAGNOSIS — J188 Other pneumonia, unspecified organism: Secondary | ICD-10-CM | POA: Diagnosis not present

## 2023-09-08 DIAGNOSIS — E876 Hypokalemia: Secondary | ICD-10-CM | POA: Diagnosis not present

## 2023-09-08 DIAGNOSIS — I10 Essential (primary) hypertension: Secondary | ICD-10-CM | POA: Diagnosis not present

## 2023-09-08 DIAGNOSIS — E11621 Type 2 diabetes mellitus with foot ulcer: Secondary | ICD-10-CM | POA: Diagnosis not present

## 2023-09-08 DIAGNOSIS — Z7901 Long term (current) use of anticoagulants: Secondary | ICD-10-CM | POA: Diagnosis not present

## 2023-09-11 DIAGNOSIS — E876 Hypokalemia: Secondary | ICD-10-CM | POA: Diagnosis not present

## 2023-09-11 DIAGNOSIS — Z9181 History of falling: Secondary | ICD-10-CM | POA: Diagnosis not present

## 2023-09-11 DIAGNOSIS — L89122 Pressure ulcer of left upper back, stage 2: Secondary | ICD-10-CM | POA: Diagnosis not present

## 2023-09-11 DIAGNOSIS — Z794 Long term (current) use of insulin: Secondary | ICD-10-CM | POA: Diagnosis not present

## 2023-09-11 DIAGNOSIS — Z466 Encounter for fitting and adjustment of urinary device: Secondary | ICD-10-CM | POA: Diagnosis not present

## 2023-09-11 DIAGNOSIS — I10 Essential (primary) hypertension: Secondary | ICD-10-CM | POA: Diagnosis not present

## 2023-09-11 DIAGNOSIS — Z7984 Long term (current) use of oral hypoglycemic drugs: Secondary | ICD-10-CM | POA: Diagnosis not present

## 2023-09-11 DIAGNOSIS — R339 Retention of urine, unspecified: Secondary | ICD-10-CM | POA: Diagnosis not present

## 2023-09-11 DIAGNOSIS — L97422 Non-pressure chronic ulcer of left heel and midfoot with fat layer exposed: Secondary | ICD-10-CM | POA: Diagnosis not present

## 2023-09-11 DIAGNOSIS — E11621 Type 2 diabetes mellitus with foot ulcer: Secondary | ICD-10-CM | POA: Diagnosis not present

## 2023-09-11 DIAGNOSIS — Z7901 Long term (current) use of anticoagulants: Secondary | ICD-10-CM | POA: Diagnosis not present

## 2023-09-11 DIAGNOSIS — J188 Other pneumonia, unspecified organism: Secondary | ICD-10-CM | POA: Diagnosis not present

## 2023-09-11 DIAGNOSIS — Z87891 Personal history of nicotine dependence: Secondary | ICD-10-CM | POA: Diagnosis not present

## 2023-09-11 DIAGNOSIS — I4891 Unspecified atrial fibrillation: Secondary | ICD-10-CM | POA: Diagnosis not present

## 2023-09-11 DIAGNOSIS — E11649 Type 2 diabetes mellitus with hypoglycemia without coma: Secondary | ICD-10-CM | POA: Diagnosis not present

## 2023-09-15 DIAGNOSIS — Z794 Long term (current) use of insulin: Secondary | ICD-10-CM | POA: Diagnosis not present

## 2023-09-15 DIAGNOSIS — L89122 Pressure ulcer of left upper back, stage 2: Secondary | ICD-10-CM | POA: Diagnosis not present

## 2023-09-15 DIAGNOSIS — Z7901 Long term (current) use of anticoagulants: Secondary | ICD-10-CM | POA: Diagnosis not present

## 2023-09-15 DIAGNOSIS — Z466 Encounter for fitting and adjustment of urinary device: Secondary | ICD-10-CM | POA: Diagnosis not present

## 2023-09-15 DIAGNOSIS — R339 Retention of urine, unspecified: Secondary | ICD-10-CM | POA: Diagnosis not present

## 2023-09-15 DIAGNOSIS — Z87891 Personal history of nicotine dependence: Secondary | ICD-10-CM | POA: Diagnosis not present

## 2023-09-15 DIAGNOSIS — J188 Other pneumonia, unspecified organism: Secondary | ICD-10-CM | POA: Diagnosis not present

## 2023-09-15 DIAGNOSIS — L97422 Non-pressure chronic ulcer of left heel and midfoot with fat layer exposed: Secondary | ICD-10-CM | POA: Diagnosis not present

## 2023-09-15 DIAGNOSIS — Z7984 Long term (current) use of oral hypoglycemic drugs: Secondary | ICD-10-CM | POA: Diagnosis not present

## 2023-09-15 DIAGNOSIS — E876 Hypokalemia: Secondary | ICD-10-CM | POA: Diagnosis not present

## 2023-09-15 DIAGNOSIS — Z9181 History of falling: Secondary | ICD-10-CM | POA: Diagnosis not present

## 2023-09-15 DIAGNOSIS — I10 Essential (primary) hypertension: Secondary | ICD-10-CM | POA: Diagnosis not present

## 2023-09-15 DIAGNOSIS — I4891 Unspecified atrial fibrillation: Secondary | ICD-10-CM | POA: Diagnosis not present

## 2023-09-15 DIAGNOSIS — E11649 Type 2 diabetes mellitus with hypoglycemia without coma: Secondary | ICD-10-CM | POA: Diagnosis not present

## 2023-09-15 DIAGNOSIS — E11621 Type 2 diabetes mellitus with foot ulcer: Secondary | ICD-10-CM | POA: Diagnosis not present

## 2023-09-17 DIAGNOSIS — E876 Hypokalemia: Secondary | ICD-10-CM | POA: Diagnosis not present

## 2023-09-17 DIAGNOSIS — Z466 Encounter for fitting and adjustment of urinary device: Secondary | ICD-10-CM | POA: Diagnosis not present

## 2023-09-17 DIAGNOSIS — Z87891 Personal history of nicotine dependence: Secondary | ICD-10-CM | POA: Diagnosis not present

## 2023-09-17 DIAGNOSIS — Z9181 History of falling: Secondary | ICD-10-CM | POA: Diagnosis not present

## 2023-09-17 DIAGNOSIS — Z794 Long term (current) use of insulin: Secondary | ICD-10-CM | POA: Diagnosis not present

## 2023-09-17 DIAGNOSIS — L97422 Non-pressure chronic ulcer of left heel and midfoot with fat layer exposed: Secondary | ICD-10-CM | POA: Diagnosis not present

## 2023-09-17 DIAGNOSIS — I10 Essential (primary) hypertension: Secondary | ICD-10-CM | POA: Diagnosis not present

## 2023-09-17 DIAGNOSIS — E11649 Type 2 diabetes mellitus with hypoglycemia without coma: Secondary | ICD-10-CM | POA: Diagnosis not present

## 2023-09-17 DIAGNOSIS — Z7901 Long term (current) use of anticoagulants: Secondary | ICD-10-CM | POA: Diagnosis not present

## 2023-09-17 DIAGNOSIS — Z7984 Long term (current) use of oral hypoglycemic drugs: Secondary | ICD-10-CM | POA: Diagnosis not present

## 2023-09-17 DIAGNOSIS — I4891 Unspecified atrial fibrillation: Secondary | ICD-10-CM | POA: Diagnosis not present

## 2023-09-17 DIAGNOSIS — R339 Retention of urine, unspecified: Secondary | ICD-10-CM | POA: Diagnosis not present

## 2023-09-17 DIAGNOSIS — J188 Other pneumonia, unspecified organism: Secondary | ICD-10-CM | POA: Diagnosis not present

## 2023-09-17 DIAGNOSIS — L89122 Pressure ulcer of left upper back, stage 2: Secondary | ICD-10-CM | POA: Diagnosis not present

## 2023-09-17 DIAGNOSIS — E11621 Type 2 diabetes mellitus with foot ulcer: Secondary | ICD-10-CM | POA: Diagnosis not present

## 2023-09-19 DIAGNOSIS — J188 Other pneumonia, unspecified organism: Secondary | ICD-10-CM | POA: Diagnosis not present

## 2023-09-19 DIAGNOSIS — Z7984 Long term (current) use of oral hypoglycemic drugs: Secondary | ICD-10-CM | POA: Diagnosis not present

## 2023-09-19 DIAGNOSIS — L97422 Non-pressure chronic ulcer of left heel and midfoot with fat layer exposed: Secondary | ICD-10-CM | POA: Diagnosis not present

## 2023-09-19 DIAGNOSIS — Z87891 Personal history of nicotine dependence: Secondary | ICD-10-CM | POA: Diagnosis not present

## 2023-09-19 DIAGNOSIS — E11649 Type 2 diabetes mellitus with hypoglycemia without coma: Secondary | ICD-10-CM | POA: Diagnosis not present

## 2023-09-19 DIAGNOSIS — Z7901 Long term (current) use of anticoagulants: Secondary | ICD-10-CM | POA: Diagnosis not present

## 2023-09-19 DIAGNOSIS — I10 Essential (primary) hypertension: Secondary | ICD-10-CM | POA: Diagnosis not present

## 2023-09-19 DIAGNOSIS — E11621 Type 2 diabetes mellitus with foot ulcer: Secondary | ICD-10-CM | POA: Diagnosis not present

## 2023-09-19 DIAGNOSIS — Z794 Long term (current) use of insulin: Secondary | ICD-10-CM | POA: Diagnosis not present

## 2023-09-19 DIAGNOSIS — E876 Hypokalemia: Secondary | ICD-10-CM | POA: Diagnosis not present

## 2023-09-19 DIAGNOSIS — R339 Retention of urine, unspecified: Secondary | ICD-10-CM | POA: Diagnosis not present

## 2023-09-19 DIAGNOSIS — I4891 Unspecified atrial fibrillation: Secondary | ICD-10-CM | POA: Diagnosis not present

## 2023-09-19 DIAGNOSIS — L89122 Pressure ulcer of left upper back, stage 2: Secondary | ICD-10-CM | POA: Diagnosis not present

## 2023-09-19 DIAGNOSIS — Z466 Encounter for fitting and adjustment of urinary device: Secondary | ICD-10-CM | POA: Diagnosis not present

## 2023-09-19 DIAGNOSIS — Z9181 History of falling: Secondary | ICD-10-CM | POA: Diagnosis not present

## 2023-09-22 DIAGNOSIS — E876 Hypokalemia: Secondary | ICD-10-CM | POA: Diagnosis not present

## 2023-09-22 DIAGNOSIS — L89122 Pressure ulcer of left upper back, stage 2: Secondary | ICD-10-CM | POA: Diagnosis not present

## 2023-09-22 DIAGNOSIS — Z7901 Long term (current) use of anticoagulants: Secondary | ICD-10-CM | POA: Diagnosis not present

## 2023-09-22 DIAGNOSIS — Z87891 Personal history of nicotine dependence: Secondary | ICD-10-CM | POA: Diagnosis not present

## 2023-09-22 DIAGNOSIS — I10 Essential (primary) hypertension: Secondary | ICD-10-CM | POA: Diagnosis not present

## 2023-09-22 DIAGNOSIS — L97422 Non-pressure chronic ulcer of left heel and midfoot with fat layer exposed: Secondary | ICD-10-CM | POA: Diagnosis not present

## 2023-09-22 DIAGNOSIS — Z9181 History of falling: Secondary | ICD-10-CM | POA: Diagnosis not present

## 2023-09-22 DIAGNOSIS — E11649 Type 2 diabetes mellitus with hypoglycemia without coma: Secondary | ICD-10-CM | POA: Diagnosis not present

## 2023-09-22 DIAGNOSIS — Z794 Long term (current) use of insulin: Secondary | ICD-10-CM | POA: Diagnosis not present

## 2023-09-22 DIAGNOSIS — R339 Retention of urine, unspecified: Secondary | ICD-10-CM | POA: Diagnosis not present

## 2023-09-22 DIAGNOSIS — E11621 Type 2 diabetes mellitus with foot ulcer: Secondary | ICD-10-CM | POA: Diagnosis not present

## 2023-09-22 DIAGNOSIS — Z7984 Long term (current) use of oral hypoglycemic drugs: Secondary | ICD-10-CM | POA: Diagnosis not present

## 2023-09-22 DIAGNOSIS — Z466 Encounter for fitting and adjustment of urinary device: Secondary | ICD-10-CM | POA: Diagnosis not present

## 2023-09-22 DIAGNOSIS — J188 Other pneumonia, unspecified organism: Secondary | ICD-10-CM | POA: Diagnosis not present

## 2023-09-22 DIAGNOSIS — I4891 Unspecified atrial fibrillation: Secondary | ICD-10-CM | POA: Diagnosis not present

## 2023-09-24 ENCOUNTER — Ambulatory Visit: Payer: Medicare HMO | Admitting: Urology

## 2023-09-24 ENCOUNTER — Encounter: Payer: Self-pay | Admitting: Urology

## 2023-09-24 VITALS — BP 114/66 | HR 65

## 2023-09-24 DIAGNOSIS — R339 Retention of urine, unspecified: Secondary | ICD-10-CM

## 2023-09-24 NOTE — Progress Notes (Signed)
    Cath Change/ Replacement  Patient is present today for a catheter change due to urinary retention.  10ml of water was removed from the balloon, a 16FR foley cath was removed without difficulty.  Patient was cleaned and prepped in a sterile fashion with Betadinex3.  Cystoscopy completed by MD. And then A 16 FR foley cath was replaced into the bladder, no complications were noted. Urine return was noted 50ml and urine was Clear yellow in color. The balloon was filled with 10ml of sterile water. A bedside bag was attached for drainage.  A night bag was also given to the patient and patient was given instruction on how to change from one bag to another. Patient was given proper instruction on catheter care.    Performed by: Marchelle Folks RN  Follow up:  per MD after urodynamics patient will return to office.

## 2023-09-24 NOTE — Progress Notes (Signed)
   09/24/23  CC: urinary retention   HPI: Jim Ramirez is a 72yo here for cystoscopy for urinary retention.  Blood pressure 114/66, pulse 65. NED. A&Ox3.   No respiratory distress   Abd soft, NT, ND Normal phallus with bilateral descended testicles  Cystoscopy Procedure Note  Patient identification was confirmed, informed consent was obtained, and patient was prepped using Betadine solution.  Lidocaine jelly was administered per urethral meatus.     Pre-Procedure: - Inspection reveals a normal caliber ureteral meatus.  Procedure: The flexible cystoscope was introduced without difficulty - No urethral strictures/lesions are present. - Normal prostate  - Normal bladder neck - Bilateral ureteral orifices identified - Bladder mucosa  reveals no ulcers, tumors, or lesions - No bladder stones - No trabeculation    Post-Procedure: - Patient tolerated the procedure well  Assessment/ Plan: We will schedule for UDS due to history of urinary retention and normal appearing prostate on cystoscopy  No follow-ups on file.  Wilkie Aye, MD

## 2023-09-24 NOTE — Patient Instructions (Signed)
 Trouble Peeing (Acute Urinary Retention) in Males: What to Know Acute urinary retention is when a person can't pee at all or can only pee a little. This can come on all of a sudden. If it's not treated, it can lead to kidney problems or other serious problems. What are the causes? Acute urinary retention may be caused by: A problem with the urethra. This is the tube that drains pee from the bladder. Problems with the nerves in the bladder. Tumors. Some medicines. An infection. Having trouble pooping (constipation). What increases the risk? Older males are more at risk because their prostate gland may get larger as they age. Other health problems can also raise the risk. These include: Multiple sclerosis. Injury to the spinal cord. Diabetes. A condition that affects the way the brain works, such as dementia. Mental health problems. Having urinary retention before. What are the signs or symptoms? Trouble peeing. Pain in the lower belly. How is this treated? Treatment may include: Medicines. Treatment for problems that may cause this. Placing a soft tube called a catheter into the bladder to drain pee out of the body. Therapy to treat mental health problems. If needed, you may be treated in the hospital for kidney problems. Follow these instructions at home: Medicines Take medicines only as told. Do not take any medicine unless your doctor says it's OK. If you were given antibiotics, take them as told. Do not stop taking them even if you start to feel better. General instructions Do not smoke, vape, or use nicotine or tobacco. Drink more fluids as told. If you need to use a catheter, follow the instructions closely. This will help prevent a bladder infection. If told, keep track of changes in your blood pressure at home. Tell your doctor about them. Contact a doctor if: You have bladder cramping, called spasms. You leak pee when you have spasms. You have a fever or chills. You  have blood in your pee. Get help right away if: You can't pee. This information is not intended to replace advice given to you by your health care provider. Make sure you discuss any questions you have with your health care provider. Document Revised: 03/13/2023 Document Reviewed: 03/13/2023 Elsevier Patient Education  2024 [employer].

## 2023-09-25 ENCOUNTER — Other Ambulatory Visit: Payer: Medicare HMO | Admitting: Urology

## 2023-09-25 DIAGNOSIS — Z9181 History of falling: Secondary | ICD-10-CM | POA: Diagnosis not present

## 2023-09-25 DIAGNOSIS — Z466 Encounter for fitting and adjustment of urinary device: Secondary | ICD-10-CM | POA: Diagnosis not present

## 2023-09-25 DIAGNOSIS — E11649 Type 2 diabetes mellitus with hypoglycemia without coma: Secondary | ICD-10-CM | POA: Diagnosis not present

## 2023-09-25 DIAGNOSIS — L89122 Pressure ulcer of left upper back, stage 2: Secondary | ICD-10-CM | POA: Diagnosis not present

## 2023-09-25 DIAGNOSIS — Z87891 Personal history of nicotine dependence: Secondary | ICD-10-CM | POA: Diagnosis not present

## 2023-09-25 DIAGNOSIS — Z794 Long term (current) use of insulin: Secondary | ICD-10-CM | POA: Diagnosis not present

## 2023-09-25 DIAGNOSIS — Z7984 Long term (current) use of oral hypoglycemic drugs: Secondary | ICD-10-CM | POA: Diagnosis not present

## 2023-09-25 DIAGNOSIS — I4891 Unspecified atrial fibrillation: Secondary | ICD-10-CM | POA: Diagnosis not present

## 2023-09-25 DIAGNOSIS — I10 Essential (primary) hypertension: Secondary | ICD-10-CM | POA: Diagnosis not present

## 2023-09-25 DIAGNOSIS — R339 Retention of urine, unspecified: Secondary | ICD-10-CM | POA: Diagnosis not present

## 2023-09-25 DIAGNOSIS — E876 Hypokalemia: Secondary | ICD-10-CM | POA: Diagnosis not present

## 2023-09-25 DIAGNOSIS — Z7901 Long term (current) use of anticoagulants: Secondary | ICD-10-CM | POA: Diagnosis not present

## 2023-09-25 DIAGNOSIS — E11621 Type 2 diabetes mellitus with foot ulcer: Secondary | ICD-10-CM | POA: Diagnosis not present

## 2023-09-25 DIAGNOSIS — J188 Other pneumonia, unspecified organism: Secondary | ICD-10-CM | POA: Diagnosis not present

## 2023-09-25 DIAGNOSIS — L97422 Non-pressure chronic ulcer of left heel and midfoot with fat layer exposed: Secondary | ICD-10-CM | POA: Diagnosis not present

## 2023-09-26 DIAGNOSIS — L89122 Pressure ulcer of left upper back, stage 2: Secondary | ICD-10-CM | POA: Diagnosis not present

## 2023-09-26 DIAGNOSIS — E11649 Type 2 diabetes mellitus with hypoglycemia without coma: Secondary | ICD-10-CM | POA: Diagnosis not present

## 2023-09-26 DIAGNOSIS — E11621 Type 2 diabetes mellitus with foot ulcer: Secondary | ICD-10-CM | POA: Diagnosis not present

## 2023-09-26 DIAGNOSIS — L97422 Non-pressure chronic ulcer of left heel and midfoot with fat layer exposed: Secondary | ICD-10-CM | POA: Diagnosis not present

## 2023-09-27 DIAGNOSIS — M6281 Muscle weakness (generalized): Secondary | ICD-10-CM | POA: Diagnosis not present

## 2023-09-27 DIAGNOSIS — I509 Heart failure, unspecified: Secondary | ICD-10-CM | POA: Diagnosis not present

## 2023-09-27 DIAGNOSIS — R600 Localized edema: Secondary | ICD-10-CM | POA: Diagnosis not present

## 2023-09-27 DIAGNOSIS — J984 Other disorders of lung: Secondary | ICD-10-CM | POA: Diagnosis not present

## 2023-09-27 DIAGNOSIS — E785 Hyperlipidemia, unspecified: Secondary | ICD-10-CM | POA: Diagnosis not present

## 2023-09-27 DIAGNOSIS — M4626 Osteomyelitis of vertebra, lumbar region: Secondary | ICD-10-CM | POA: Diagnosis not present

## 2023-09-27 DIAGNOSIS — E119 Type 2 diabetes mellitus without complications: Secondary | ICD-10-CM | POA: Diagnosis not present

## 2023-09-30 DIAGNOSIS — I10 Essential (primary) hypertension: Secondary | ICD-10-CM | POA: Diagnosis not present

## 2023-09-30 DIAGNOSIS — R339 Retention of urine, unspecified: Secondary | ICD-10-CM | POA: Diagnosis not present

## 2023-09-30 DIAGNOSIS — E11649 Type 2 diabetes mellitus with hypoglycemia without coma: Secondary | ICD-10-CM | POA: Diagnosis not present

## 2023-09-30 DIAGNOSIS — Z466 Encounter for fitting and adjustment of urinary device: Secondary | ICD-10-CM | POA: Diagnosis not present

## 2023-09-30 DIAGNOSIS — Z7901 Long term (current) use of anticoagulants: Secondary | ICD-10-CM | POA: Diagnosis not present

## 2023-09-30 DIAGNOSIS — Z7984 Long term (current) use of oral hypoglycemic drugs: Secondary | ICD-10-CM | POA: Diagnosis not present

## 2023-09-30 DIAGNOSIS — L89122 Pressure ulcer of left upper back, stage 2: Secondary | ICD-10-CM | POA: Diagnosis not present

## 2023-09-30 DIAGNOSIS — L97422 Non-pressure chronic ulcer of left heel and midfoot with fat layer exposed: Secondary | ICD-10-CM | POA: Diagnosis not present

## 2023-09-30 DIAGNOSIS — E876 Hypokalemia: Secondary | ICD-10-CM | POA: Diagnosis not present

## 2023-09-30 DIAGNOSIS — E11621 Type 2 diabetes mellitus with foot ulcer: Secondary | ICD-10-CM | POA: Diagnosis not present

## 2023-09-30 DIAGNOSIS — J188 Other pneumonia, unspecified organism: Secondary | ICD-10-CM | POA: Diagnosis not present

## 2023-09-30 DIAGNOSIS — Z9181 History of falling: Secondary | ICD-10-CM | POA: Diagnosis not present

## 2023-09-30 DIAGNOSIS — I4891 Unspecified atrial fibrillation: Secondary | ICD-10-CM | POA: Diagnosis not present

## 2023-09-30 DIAGNOSIS — Z87891 Personal history of nicotine dependence: Secondary | ICD-10-CM | POA: Diagnosis not present

## 2023-09-30 DIAGNOSIS — Z794 Long term (current) use of insulin: Secondary | ICD-10-CM | POA: Diagnosis not present

## 2023-10-07 DIAGNOSIS — Z9181 History of falling: Secondary | ICD-10-CM | POA: Diagnosis not present

## 2023-10-07 DIAGNOSIS — Z7901 Long term (current) use of anticoagulants: Secondary | ICD-10-CM | POA: Diagnosis not present

## 2023-10-07 DIAGNOSIS — E876 Hypokalemia: Secondary | ICD-10-CM | POA: Diagnosis not present

## 2023-10-07 DIAGNOSIS — Z87891 Personal history of nicotine dependence: Secondary | ICD-10-CM | POA: Diagnosis not present

## 2023-10-07 DIAGNOSIS — Z7984 Long term (current) use of oral hypoglycemic drugs: Secondary | ICD-10-CM | POA: Diagnosis not present

## 2023-10-07 DIAGNOSIS — Z466 Encounter for fitting and adjustment of urinary device: Secondary | ICD-10-CM | POA: Diagnosis not present

## 2023-10-07 DIAGNOSIS — I10 Essential (primary) hypertension: Secondary | ICD-10-CM | POA: Diagnosis not present

## 2023-10-07 DIAGNOSIS — E11621 Type 2 diabetes mellitus with foot ulcer: Secondary | ICD-10-CM | POA: Diagnosis not present

## 2023-10-07 DIAGNOSIS — J188 Other pneumonia, unspecified organism: Secondary | ICD-10-CM | POA: Diagnosis not present

## 2023-10-07 DIAGNOSIS — R339 Retention of urine, unspecified: Secondary | ICD-10-CM | POA: Diagnosis not present

## 2023-10-07 DIAGNOSIS — Z794 Long term (current) use of insulin: Secondary | ICD-10-CM | POA: Diagnosis not present

## 2023-10-07 DIAGNOSIS — I4891 Unspecified atrial fibrillation: Secondary | ICD-10-CM | POA: Diagnosis not present

## 2023-10-07 DIAGNOSIS — E11649 Type 2 diabetes mellitus with hypoglycemia without coma: Secondary | ICD-10-CM | POA: Diagnosis not present

## 2023-10-07 DIAGNOSIS — L89122 Pressure ulcer of left upper back, stage 2: Secondary | ICD-10-CM | POA: Diagnosis not present

## 2023-10-07 DIAGNOSIS — L97422 Non-pressure chronic ulcer of left heel and midfoot with fat layer exposed: Secondary | ICD-10-CM | POA: Diagnosis not present

## 2023-10-14 DIAGNOSIS — L89122 Pressure ulcer of left upper back, stage 2: Secondary | ICD-10-CM | POA: Diagnosis not present

## 2023-10-14 DIAGNOSIS — Z466 Encounter for fitting and adjustment of urinary device: Secondary | ICD-10-CM | POA: Diagnosis not present

## 2023-10-14 DIAGNOSIS — E876 Hypokalemia: Secondary | ICD-10-CM | POA: Diagnosis not present

## 2023-10-14 DIAGNOSIS — E11621 Type 2 diabetes mellitus with foot ulcer: Secondary | ICD-10-CM | POA: Diagnosis not present

## 2023-10-14 DIAGNOSIS — E11649 Type 2 diabetes mellitus with hypoglycemia without coma: Secondary | ICD-10-CM | POA: Diagnosis not present

## 2023-10-14 DIAGNOSIS — Z7901 Long term (current) use of anticoagulants: Secondary | ICD-10-CM | POA: Diagnosis not present

## 2023-10-14 DIAGNOSIS — Z794 Long term (current) use of insulin: Secondary | ICD-10-CM | POA: Diagnosis not present

## 2023-10-14 DIAGNOSIS — I10 Essential (primary) hypertension: Secondary | ICD-10-CM | POA: Diagnosis not present

## 2023-10-14 DIAGNOSIS — Z9181 History of falling: Secondary | ICD-10-CM | POA: Diagnosis not present

## 2023-10-14 DIAGNOSIS — I4891 Unspecified atrial fibrillation: Secondary | ICD-10-CM | POA: Diagnosis not present

## 2023-10-14 DIAGNOSIS — Z7984 Long term (current) use of oral hypoglycemic drugs: Secondary | ICD-10-CM | POA: Diagnosis not present

## 2023-10-14 DIAGNOSIS — Z87891 Personal history of nicotine dependence: Secondary | ICD-10-CM | POA: Diagnosis not present

## 2023-10-14 DIAGNOSIS — R339 Retention of urine, unspecified: Secondary | ICD-10-CM | POA: Diagnosis not present

## 2023-10-14 DIAGNOSIS — J188 Other pneumonia, unspecified organism: Secondary | ICD-10-CM | POA: Diagnosis not present

## 2023-10-14 DIAGNOSIS — L97422 Non-pressure chronic ulcer of left heel and midfoot with fat layer exposed: Secondary | ICD-10-CM | POA: Diagnosis not present

## 2023-10-15 DIAGNOSIS — L97421 Non-pressure chronic ulcer of left heel and midfoot limited to breakdown of skin: Secondary | ICD-10-CM | POA: Diagnosis not present

## 2023-10-15 DIAGNOSIS — L89103 Pressure ulcer of unspecified part of back, stage 3: Secondary | ICD-10-CM | POA: Diagnosis not present

## 2023-10-20 DIAGNOSIS — Z794 Long term (current) use of insulin: Secondary | ICD-10-CM | POA: Diagnosis not present

## 2023-10-20 DIAGNOSIS — J188 Other pneumonia, unspecified organism: Secondary | ICD-10-CM | POA: Diagnosis not present

## 2023-10-20 DIAGNOSIS — L89122 Pressure ulcer of left upper back, stage 2: Secondary | ICD-10-CM | POA: Diagnosis not present

## 2023-10-20 DIAGNOSIS — I10 Essential (primary) hypertension: Secondary | ICD-10-CM | POA: Diagnosis not present

## 2023-10-20 DIAGNOSIS — E11621 Type 2 diabetes mellitus with foot ulcer: Secondary | ICD-10-CM | POA: Diagnosis not present

## 2023-10-20 DIAGNOSIS — L97422 Non-pressure chronic ulcer of left heel and midfoot with fat layer exposed: Secondary | ICD-10-CM | POA: Diagnosis not present

## 2023-10-20 DIAGNOSIS — Z7901 Long term (current) use of anticoagulants: Secondary | ICD-10-CM | POA: Diagnosis not present

## 2023-10-20 DIAGNOSIS — Z87891 Personal history of nicotine dependence: Secondary | ICD-10-CM | POA: Diagnosis not present

## 2023-10-20 DIAGNOSIS — E11649 Type 2 diabetes mellitus with hypoglycemia without coma: Secondary | ICD-10-CM | POA: Diagnosis not present

## 2023-10-20 DIAGNOSIS — E876 Hypokalemia: Secondary | ICD-10-CM | POA: Diagnosis not present

## 2023-10-20 DIAGNOSIS — R339 Retention of urine, unspecified: Secondary | ICD-10-CM | POA: Diagnosis not present

## 2023-10-20 DIAGNOSIS — Z466 Encounter for fitting and adjustment of urinary device: Secondary | ICD-10-CM | POA: Diagnosis not present

## 2023-10-20 DIAGNOSIS — I4891 Unspecified atrial fibrillation: Secondary | ICD-10-CM | POA: Diagnosis not present

## 2023-10-20 DIAGNOSIS — Z7984 Long term (current) use of oral hypoglycemic drugs: Secondary | ICD-10-CM | POA: Diagnosis not present

## 2023-10-20 DIAGNOSIS — Z9181 History of falling: Secondary | ICD-10-CM | POA: Diagnosis not present

## 2023-10-22 ENCOUNTER — Ambulatory Visit: Payer: Medicare HMO

## 2023-10-22 DIAGNOSIS — L89103 Pressure ulcer of unspecified part of back, stage 3: Secondary | ICD-10-CM | POA: Diagnosis not present

## 2023-10-22 DIAGNOSIS — L97421 Non-pressure chronic ulcer of left heel and midfoot limited to breakdown of skin: Secondary | ICD-10-CM | POA: Diagnosis not present

## 2023-10-23 DIAGNOSIS — E11649 Type 2 diabetes mellitus with hypoglycemia without coma: Secondary | ICD-10-CM | POA: Diagnosis not present

## 2023-10-23 DIAGNOSIS — E11621 Type 2 diabetes mellitus with foot ulcer: Secondary | ICD-10-CM | POA: Diagnosis not present

## 2023-10-23 DIAGNOSIS — Z466 Encounter for fitting and adjustment of urinary device: Secondary | ICD-10-CM | POA: Diagnosis not present

## 2023-10-23 DIAGNOSIS — L97422 Non-pressure chronic ulcer of left heel and midfoot with fat layer exposed: Secondary | ICD-10-CM | POA: Diagnosis not present

## 2023-10-23 DIAGNOSIS — L89122 Pressure ulcer of left upper back, stage 2: Secondary | ICD-10-CM | POA: Diagnosis not present

## 2023-10-23 DIAGNOSIS — I4891 Unspecified atrial fibrillation: Secondary | ICD-10-CM | POA: Diagnosis not present

## 2023-10-23 DIAGNOSIS — R339 Retention of urine, unspecified: Secondary | ICD-10-CM | POA: Diagnosis not present

## 2023-10-23 DIAGNOSIS — Z87891 Personal history of nicotine dependence: Secondary | ICD-10-CM | POA: Diagnosis not present

## 2023-10-23 DIAGNOSIS — I10 Essential (primary) hypertension: Secondary | ICD-10-CM | POA: Diagnosis not present

## 2023-10-23 DIAGNOSIS — Z794 Long term (current) use of insulin: Secondary | ICD-10-CM | POA: Diagnosis not present

## 2023-10-23 DIAGNOSIS — Z7984 Long term (current) use of oral hypoglycemic drugs: Secondary | ICD-10-CM | POA: Diagnosis not present

## 2023-10-23 DIAGNOSIS — E876 Hypokalemia: Secondary | ICD-10-CM | POA: Diagnosis not present

## 2023-10-23 DIAGNOSIS — J188 Other pneumonia, unspecified organism: Secondary | ICD-10-CM | POA: Diagnosis not present

## 2023-10-23 DIAGNOSIS — Z7901 Long term (current) use of anticoagulants: Secondary | ICD-10-CM | POA: Diagnosis not present

## 2023-10-23 DIAGNOSIS — Z9181 History of falling: Secondary | ICD-10-CM | POA: Diagnosis not present

## 2023-10-27 DIAGNOSIS — Z466 Encounter for fitting and adjustment of urinary device: Secondary | ICD-10-CM | POA: Diagnosis not present

## 2023-10-27 DIAGNOSIS — L97422 Non-pressure chronic ulcer of left heel and midfoot with fat layer exposed: Secondary | ICD-10-CM | POA: Diagnosis not present

## 2023-10-27 DIAGNOSIS — R339 Retention of urine, unspecified: Secondary | ICD-10-CM | POA: Diagnosis not present

## 2023-10-27 DIAGNOSIS — Z9181 History of falling: Secondary | ICD-10-CM | POA: Diagnosis not present

## 2023-10-27 DIAGNOSIS — I10 Essential (primary) hypertension: Secondary | ICD-10-CM | POA: Diagnosis not present

## 2023-10-27 DIAGNOSIS — I4891 Unspecified atrial fibrillation: Secondary | ICD-10-CM | POA: Diagnosis not present

## 2023-10-27 DIAGNOSIS — Z794 Long term (current) use of insulin: Secondary | ICD-10-CM | POA: Diagnosis not present

## 2023-10-27 DIAGNOSIS — Z7984 Long term (current) use of oral hypoglycemic drugs: Secondary | ICD-10-CM | POA: Diagnosis not present

## 2023-10-27 DIAGNOSIS — E11621 Type 2 diabetes mellitus with foot ulcer: Secondary | ICD-10-CM | POA: Diagnosis not present

## 2023-10-27 DIAGNOSIS — Z87891 Personal history of nicotine dependence: Secondary | ICD-10-CM | POA: Diagnosis not present

## 2023-10-27 DIAGNOSIS — J188 Other pneumonia, unspecified organism: Secondary | ICD-10-CM | POA: Diagnosis not present

## 2023-10-27 DIAGNOSIS — E876 Hypokalemia: Secondary | ICD-10-CM | POA: Diagnosis not present

## 2023-10-27 DIAGNOSIS — E11649 Type 2 diabetes mellitus with hypoglycemia without coma: Secondary | ICD-10-CM | POA: Diagnosis not present

## 2023-10-27 DIAGNOSIS — Z7901 Long term (current) use of anticoagulants: Secondary | ICD-10-CM | POA: Diagnosis not present

## 2023-10-27 DIAGNOSIS — L89122 Pressure ulcer of left upper back, stage 2: Secondary | ICD-10-CM | POA: Diagnosis not present

## 2023-10-28 DIAGNOSIS — E785 Hyperlipidemia, unspecified: Secondary | ICD-10-CM | POA: Diagnosis not present

## 2023-10-28 DIAGNOSIS — J984 Other disorders of lung: Secondary | ICD-10-CM | POA: Diagnosis not present

## 2023-10-28 DIAGNOSIS — M4626 Osteomyelitis of vertebra, lumbar region: Secondary | ICD-10-CM | POA: Diagnosis not present

## 2023-10-28 DIAGNOSIS — R600 Localized edema: Secondary | ICD-10-CM | POA: Diagnosis not present

## 2023-10-28 DIAGNOSIS — E119 Type 2 diabetes mellitus without complications: Secondary | ICD-10-CM | POA: Diagnosis not present

## 2023-10-28 DIAGNOSIS — M6281 Muscle weakness (generalized): Secondary | ICD-10-CM | POA: Diagnosis not present

## 2023-10-28 DIAGNOSIS — I509 Heart failure, unspecified: Secondary | ICD-10-CM | POA: Diagnosis not present

## 2023-10-29 DIAGNOSIS — L97421 Non-pressure chronic ulcer of left heel and midfoot limited to breakdown of skin: Secondary | ICD-10-CM | POA: Diagnosis not present

## 2023-10-29 DIAGNOSIS — L89103 Pressure ulcer of unspecified part of back, stage 3: Secondary | ICD-10-CM | POA: Diagnosis not present

## 2023-10-31 DIAGNOSIS — E876 Hypokalemia: Secondary | ICD-10-CM | POA: Diagnosis not present

## 2023-10-31 DIAGNOSIS — R339 Retention of urine, unspecified: Secondary | ICD-10-CM | POA: Diagnosis not present

## 2023-10-31 DIAGNOSIS — E11649 Type 2 diabetes mellitus with hypoglycemia without coma: Secondary | ICD-10-CM | POA: Diagnosis not present

## 2023-10-31 DIAGNOSIS — Z7984 Long term (current) use of oral hypoglycemic drugs: Secondary | ICD-10-CM | POA: Diagnosis not present

## 2023-10-31 DIAGNOSIS — Z794 Long term (current) use of insulin: Secondary | ICD-10-CM | POA: Diagnosis not present

## 2023-10-31 DIAGNOSIS — J188 Other pneumonia, unspecified organism: Secondary | ICD-10-CM | POA: Diagnosis not present

## 2023-10-31 DIAGNOSIS — I10 Essential (primary) hypertension: Secondary | ICD-10-CM | POA: Diagnosis not present

## 2023-10-31 DIAGNOSIS — Z466 Encounter for fitting and adjustment of urinary device: Secondary | ICD-10-CM | POA: Diagnosis not present

## 2023-10-31 DIAGNOSIS — E11621 Type 2 diabetes mellitus with foot ulcer: Secondary | ICD-10-CM | POA: Diagnosis not present

## 2023-10-31 DIAGNOSIS — L97422 Non-pressure chronic ulcer of left heel and midfoot with fat layer exposed: Secondary | ICD-10-CM | POA: Diagnosis not present

## 2023-10-31 DIAGNOSIS — L89122 Pressure ulcer of left upper back, stage 2: Secondary | ICD-10-CM | POA: Diagnosis not present

## 2023-10-31 DIAGNOSIS — Z9181 History of falling: Secondary | ICD-10-CM | POA: Diagnosis not present

## 2023-10-31 DIAGNOSIS — Z87891 Personal history of nicotine dependence: Secondary | ICD-10-CM | POA: Diagnosis not present

## 2023-10-31 DIAGNOSIS — Z7901 Long term (current) use of anticoagulants: Secondary | ICD-10-CM | POA: Diagnosis not present

## 2023-10-31 DIAGNOSIS — I4891 Unspecified atrial fibrillation: Secondary | ICD-10-CM | POA: Diagnosis not present

## 2023-11-03 DIAGNOSIS — Z7901 Long term (current) use of anticoagulants: Secondary | ICD-10-CM | POA: Diagnosis not present

## 2023-11-03 DIAGNOSIS — Z466 Encounter for fitting and adjustment of urinary device: Secondary | ICD-10-CM | POA: Diagnosis not present

## 2023-11-03 DIAGNOSIS — Z7984 Long term (current) use of oral hypoglycemic drugs: Secondary | ICD-10-CM | POA: Diagnosis not present

## 2023-11-03 DIAGNOSIS — E11621 Type 2 diabetes mellitus with foot ulcer: Secondary | ICD-10-CM | POA: Diagnosis not present

## 2023-11-03 DIAGNOSIS — I4891 Unspecified atrial fibrillation: Secondary | ICD-10-CM | POA: Diagnosis not present

## 2023-11-03 DIAGNOSIS — R339 Retention of urine, unspecified: Secondary | ICD-10-CM | POA: Diagnosis not present

## 2023-11-03 DIAGNOSIS — E876 Hypokalemia: Secondary | ICD-10-CM | POA: Diagnosis not present

## 2023-11-03 DIAGNOSIS — E11649 Type 2 diabetes mellitus with hypoglycemia without coma: Secondary | ICD-10-CM | POA: Diagnosis not present

## 2023-11-03 DIAGNOSIS — Z9181 History of falling: Secondary | ICD-10-CM | POA: Diagnosis not present

## 2023-11-03 DIAGNOSIS — J188 Other pneumonia, unspecified organism: Secondary | ICD-10-CM | POA: Diagnosis not present

## 2023-11-03 DIAGNOSIS — Z87891 Personal history of nicotine dependence: Secondary | ICD-10-CM | POA: Diagnosis not present

## 2023-11-03 DIAGNOSIS — L89122 Pressure ulcer of left upper back, stage 2: Secondary | ICD-10-CM | POA: Diagnosis not present

## 2023-11-03 DIAGNOSIS — L97422 Non-pressure chronic ulcer of left heel and midfoot with fat layer exposed: Secondary | ICD-10-CM | POA: Diagnosis not present

## 2023-11-03 DIAGNOSIS — I10 Essential (primary) hypertension: Secondary | ICD-10-CM | POA: Diagnosis not present

## 2023-11-03 DIAGNOSIS — Z794 Long term (current) use of insulin: Secondary | ICD-10-CM | POA: Diagnosis not present

## 2023-11-05 DIAGNOSIS — L89103 Pressure ulcer of unspecified part of back, stage 3: Secondary | ICD-10-CM | POA: Diagnosis not present

## 2023-11-05 DIAGNOSIS — L97421 Non-pressure chronic ulcer of left heel and midfoot limited to breakdown of skin: Secondary | ICD-10-CM | POA: Diagnosis not present

## 2023-11-07 DIAGNOSIS — E11649 Type 2 diabetes mellitus with hypoglycemia without coma: Secondary | ICD-10-CM | POA: Diagnosis not present

## 2023-11-07 DIAGNOSIS — R339 Retention of urine, unspecified: Secondary | ICD-10-CM | POA: Diagnosis not present

## 2023-11-07 DIAGNOSIS — L89892 Pressure ulcer of other site, stage 2: Secondary | ICD-10-CM | POA: Diagnosis not present

## 2023-11-07 DIAGNOSIS — Z9181 History of falling: Secondary | ICD-10-CM | POA: Diagnosis not present

## 2023-11-07 DIAGNOSIS — I4891 Unspecified atrial fibrillation: Secondary | ICD-10-CM | POA: Diagnosis not present

## 2023-11-07 DIAGNOSIS — Z466 Encounter for fitting and adjustment of urinary device: Secondary | ICD-10-CM | POA: Diagnosis not present

## 2023-11-07 DIAGNOSIS — Z794 Long term (current) use of insulin: Secondary | ICD-10-CM | POA: Diagnosis not present

## 2023-11-07 DIAGNOSIS — Z7901 Long term (current) use of anticoagulants: Secondary | ICD-10-CM | POA: Diagnosis not present

## 2023-11-07 DIAGNOSIS — Z87891 Personal history of nicotine dependence: Secondary | ICD-10-CM | POA: Diagnosis not present

## 2023-11-07 DIAGNOSIS — I10 Essential (primary) hypertension: Secondary | ICD-10-CM | POA: Diagnosis not present

## 2023-11-07 DIAGNOSIS — Z7984 Long term (current) use of oral hypoglycemic drugs: Secondary | ICD-10-CM | POA: Diagnosis not present

## 2023-11-11 DIAGNOSIS — Z7901 Long term (current) use of anticoagulants: Secondary | ICD-10-CM | POA: Diagnosis not present

## 2023-11-11 DIAGNOSIS — Z9181 History of falling: Secondary | ICD-10-CM | POA: Diagnosis not present

## 2023-11-11 DIAGNOSIS — Z7984 Long term (current) use of oral hypoglycemic drugs: Secondary | ICD-10-CM | POA: Diagnosis not present

## 2023-11-11 DIAGNOSIS — I4891 Unspecified atrial fibrillation: Secondary | ICD-10-CM | POA: Diagnosis not present

## 2023-11-11 DIAGNOSIS — L89892 Pressure ulcer of other site, stage 2: Secondary | ICD-10-CM | POA: Diagnosis not present

## 2023-11-11 DIAGNOSIS — E11649 Type 2 diabetes mellitus with hypoglycemia without coma: Secondary | ICD-10-CM | POA: Diagnosis not present

## 2023-11-11 DIAGNOSIS — I10 Essential (primary) hypertension: Secondary | ICD-10-CM | POA: Diagnosis not present

## 2023-11-11 DIAGNOSIS — R339 Retention of urine, unspecified: Secondary | ICD-10-CM | POA: Diagnosis not present

## 2023-11-11 DIAGNOSIS — Z466 Encounter for fitting and adjustment of urinary device: Secondary | ICD-10-CM | POA: Diagnosis not present

## 2023-11-11 DIAGNOSIS — Z794 Long term (current) use of insulin: Secondary | ICD-10-CM | POA: Diagnosis not present

## 2023-11-11 DIAGNOSIS — Z87891 Personal history of nicotine dependence: Secondary | ICD-10-CM | POA: Diagnosis not present

## 2023-11-13 DIAGNOSIS — I4891 Unspecified atrial fibrillation: Secondary | ICD-10-CM | POA: Diagnosis not present

## 2023-11-13 DIAGNOSIS — E11649 Type 2 diabetes mellitus with hypoglycemia without coma: Secondary | ICD-10-CM | POA: Diagnosis not present

## 2023-11-13 DIAGNOSIS — L89892 Pressure ulcer of other site, stage 2: Secondary | ICD-10-CM | POA: Diagnosis not present

## 2023-11-13 DIAGNOSIS — I10 Essential (primary) hypertension: Secondary | ICD-10-CM | POA: Diagnosis not present

## 2023-11-14 ENCOUNTER — Ambulatory Visit: Payer: Medicare HMO | Attending: Cardiology | Admitting: Cardiology

## 2023-11-14 ENCOUNTER — Encounter: Payer: Self-pay | Admitting: Cardiology

## 2023-11-14 VITALS — BP 100/58 | HR 68 | Ht 72.0 in | Wt 175.0 lb

## 2023-11-14 DIAGNOSIS — Z466 Encounter for fitting and adjustment of urinary device: Secondary | ICD-10-CM | POA: Diagnosis not present

## 2023-11-14 DIAGNOSIS — R339 Retention of urine, unspecified: Secondary | ICD-10-CM | POA: Diagnosis not present

## 2023-11-14 DIAGNOSIS — Z794 Long term (current) use of insulin: Secondary | ICD-10-CM | POA: Diagnosis not present

## 2023-11-14 DIAGNOSIS — I480 Paroxysmal atrial fibrillation: Secondary | ICD-10-CM | POA: Diagnosis not present

## 2023-11-14 DIAGNOSIS — L89892 Pressure ulcer of other site, stage 2: Secondary | ICD-10-CM | POA: Diagnosis not present

## 2023-11-14 DIAGNOSIS — Z7984 Long term (current) use of oral hypoglycemic drugs: Secondary | ICD-10-CM | POA: Diagnosis not present

## 2023-11-14 DIAGNOSIS — I10 Essential (primary) hypertension: Secondary | ICD-10-CM

## 2023-11-14 DIAGNOSIS — Z7901 Long term (current) use of anticoagulants: Secondary | ICD-10-CM | POA: Diagnosis not present

## 2023-11-14 DIAGNOSIS — Z9181 History of falling: Secondary | ICD-10-CM | POA: Diagnosis not present

## 2023-11-14 DIAGNOSIS — E11649 Type 2 diabetes mellitus with hypoglycemia without coma: Secondary | ICD-10-CM | POA: Diagnosis not present

## 2023-11-14 DIAGNOSIS — I4891 Unspecified atrial fibrillation: Secondary | ICD-10-CM | POA: Diagnosis not present

## 2023-11-14 DIAGNOSIS — Z87891 Personal history of nicotine dependence: Secondary | ICD-10-CM | POA: Diagnosis not present

## 2023-11-14 NOTE — Patient Instructions (Signed)
 Medication Instructions:  Your physician recommends that you continue on your current medications as directed. Please refer to the Current Medication list given to you today.  *If you need a refill on your cardiac medications before your next appointmen

## 2023-11-14 NOTE — Progress Notes (Signed)
 Clinical Summary Jim Ramirez is a 73 y.o.male seen today for follow up of the following medical problems.      1.History of osteomyelitis/bacteremia/discitis/epidural abscess Ultimately required lumbar laminectomy and evacuation of his abscess on 6/24  - followed by ID as outpatient.      2. Afib  - new diagnosis during 11/2022 admission with bacteremia - managed with IV amiodarone , transitioned to oral - could start anticoag once cleared by neurosurg, has been on eliquis  5mg  bid   - amio no longer on list. Admitted 04/2023 [facility], not listed at all med list. Assuming confusion and it was omitted - reports palpitatons, lasting up to 30 minute at times.    06/2023 monitor: rare ectopy, no significant arrhythmias - no recent palpitations - no bleeding on eliquis .     3. DVT - IVC filter placed by IR - 12/2022 +DVT   04/2023 admission with pneumonia [facility], no mention of eliquis  on his paperwork, appears was omitted.    4. History of spontaneous subarachnoid hemorrhage - followed by neurosurgy - eliquis  was started during recent admission for DVT/afib as neurosurg team followed     5. HTN - 100-110s/70s by home checks - compliant with meds 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   CTA negative for aneurysm   Depressed    Diabetes mellitus without complication (HCC)    DVT (deep venous thrombosis) (HCC)    History of kidney stones    Hypertension    Nephrolithiasis    Sleep apnea      No Known Allergies   Current Outpatient Medications  Medication Sig Dispense Refill   acetaminophen  (TYLENOL ) 325 MG tablet Take 2 tablets (650 mg total) by mouth every 6 (six) hours as needed for mild pain (pain score 1-3) (or Fever >/= 101).     albuterol  (VENTOLIN  HFA) 108 (90 Base) MCG/ACT inhaler Inhale 2 puffs into the lungs every 6 (six) hours as needed for wheezing or shortness of breath. 18 g 2    apixaban  (ELIQUIS ) 5 MG TABS tablet Take 1 tablet (5 mg total) by mouth 2 (two) times daily. 60 tablet 11   ascorbic acid  (VITAMIN C ) 500 MG tablet Take 500 mg by mouth daily.     betamethasone dipropionate 0.05 % lotion APPLY TO AFFECTED AREA TWICE A DAY     cefadroxil  (DURICEF) 500 MG capsule Take 2 capsules (1,000 mg total) by mouth 2 (two) times daily. Continue for long term thru end of May 2025     clotrimazole-betamethasone (LOTRISONE) cream Apply 1 Application topically 2 (two) times daily. Apply to sacrum and groin topically for wound care     cyanocobalamin  (VITAMIN B12) 1000 MCG/ML injection USE 1 (ONE) MILLILITER ONCE PER MONTH     escitalopram  (LEXAPRO ) 20 MG tablet Take 20 mg by mouth daily.     esomeprazole (NEXIUM) 40 MG capsule Take 40 mg by mouth daily.     ferrous sulfate  325 (65 FE) MG tablet Take 325 mg by mouth 2 (two) times daily.     hydrochlorothiazide  (HYDRODIURIL ) 25 MG tablet Take 25 mg by mouth daily.     levocetirizine (XYZAL ) 5 MG tablet Take 5 mg by mouth daily.     melatonin 3 MG TABS tablet Take 3 mg by mouth at bedtime.     metFORMIN  (GLUCOPHAGE ) 1000 MG tablet Take 1,000 mg by mouth 2 (two) times daily.     metoprolol   succinate (TOPROL -XL) 25 MG 24 hr tablet Take 25 mg by mouth daily.     metoprolol  tartrate (LOPRESSOR ) 25 MG tablet Take 0.5 tablets (12.5 mg total) by mouth 2 (two) times daily.     NOVOLIN 70/30 (70-30) 100 UNIT/ML injection INJECT 65 - 75 U IN THE MORNING AND 12 U IN THE EVENING, MAX DAILY DOSE: 100 UNIT     Nutritional Supplements (PROMOD) LIQD Take 30 mLs by mouth in the morning and at bedtime. For wound healing     oxyCODONE  (OXY IR/ROXICODONE ) 5 MG immediate release tablet Take 1 tablet (5 mg total) by mouth every 6 (six) hours as needed for moderate pain (pain score 4-6) or severe pain (pain score 7-10). 12 tablet 0   rosuvastatin  (CRESTOR ) 20 MG tablet Take 20 mg by mouth daily.     tamsulosin  (FLOMAX ) 0.4 MG CAPS capsule Take 0.4 mg by  mouth daily.     trandolapril  (MAVIK ) 2 MG tablet Take 2 mg by mouth daily.     Zinc  50 MG TABS Take 50 mg by mouth daily.     zolpidem  (AMBIEN ) 10 MG tablet Take 10 mg by mouth at bedtime.     No current facility-administered medications for this visit.     Past Surgical History:  Procedure Laterality Date   ANTERIOR CERVICAL DECOMP/DISCECTOMY FUSION N/A 05/22/2022   Procedure: ANTERIOR CERVICAL DISCECTOMY FUSION, INTERBODY PROSTHESIS,PLATE/SCREWS CERVICAL THREE-FOUR, CERVICAL FOUR-FIVE;REMOVAL CERVICAL PLATE;  Surgeon: Garry Kansas, MD;  Location: [facility] OR;  Service: Neurosurgery;  Laterality: N/A;  3C   CATARACT EXTRACTION W/PHACO Left 09/23/2022   Procedure: CATARACT EXTRACTION PHACO AND INTRAOCULAR LENS PLACEMENT (IOC);  Surgeon: Tarri Farm, MD;  Location: [facility];  Service: Ophthalmology;  Laterality: Left;  CDE: 7.52   CATARACT EXTRACTION W/PHACO Right 10/07/2022   Procedure: CATARACT EXTRACTION PHACO AND INTRAOCULAR LENS PLACEMENT (IOC);  Surgeon: Tarri Farm, MD;  Location: [facility];  Service: Ophthalmology;  Laterality: Right;  CDE: 9.40   IR IVC FILTER PLMT / S&I /IMG GUID/MOD SED  01/19/2023   LUMBAR LAMINECTOMY/DECOMPRESSION MICRODISCECTOMY N/A 01/20/2023   Procedure: LUMBAR LAM FOR EPIDURAL ABSCESS, LUMBAR THREE-FOUR, LUMBAR FOUR-FIVE,  LUMBAR FIVE-SACRAL ONE;  Surgeon: Garry Kansas, MD;  Location: [facility] OR;  Service: Neurosurgery;  Laterality: N/A;   STOMACH SURGERY     TEE WITHOUT CARDIOVERSION N/A 12/31/2022   Procedure: TRANSESOPHAGEAL ECHOCARDIOGRAM;  Surgeon: Wendie Hamburg, MD;  Location: [facility] INVASIVE CV LAB;  Service: Cardiovascular;  Laterality: N/A;   TONSILLECTOMY  2000     No Known Allergies    Family History  Problem Relation Age of Onset   Heart attack Mother    Heart attack Father    Hypertension Brother    Diabetes Brother    Coronary artery disease Brother      Social History Jim Ramirez reports that he quit smoking about 31 years ago.  His smoking use included cigarettes. He has never been exposed to tobacco smoke. He has never used smokeless tobacco. Jim Ramirez reports that he does not currently use alcohol .  Physical Examination Today's Vitals   11/14/23 1046  BP: (!) 100/58  Pulse: 68  SpO2: 98%  Weight: 175 lb (79.4 kg)  Height: 6' (1.829 m)   Body mass index is 23.73 kg/m.  Gen: resting comfortably, no acute distress HEENT: no scleral icterus, pupils equal round and reactive, no palptable cervical adenopathy,  CV: RRR, no mrg, no jvd Resp: Clear to auscultation bilaterally GI: abdomen is soft, non-tender, non-distended, normal bowel  sounds, no hepatosplenomegaly MSK: extremities are warm, no edema.  Skin: warm, no rash Neuro:  no focal deficits Psych: appropriate affect   Diagnostic Studies  05/2022 echo with pcp: LVEF 55-60%, grade I dd J an 2024 nuclear stress:  Patient exercised for 6 minutes and 6 seconds, according to Bruce protocol, achieving 7.0 METS. HR increased to 133 bpm which is 88% of maximum predicted HR. Patient has average exercise capacity.   No chest pain during the test   Stress ECG is negative for ischemia.   LV perfusion is normal. There is no evidence of current ischemia or prior infarction.   Left ventricular function is normal. Nuclear stress EF: 65 %.   The study is normal. The study is low risk.   Assessment and Plan   1.Afib - diagnosed during admission with severe systemic illness, unclear if isolated - had been on amio, was discontinued it appears during an admission. We have not restarted since no recurrence of afib, could reconsider if recurrent afib as he tolerated it well - eliquis  also omitted during that admission, will restart that - continue eliquis  for stroke prevention   2.HTN - at goal, continue curreng meds      Laurann Pollock, M.D.

## 2023-11-17 DIAGNOSIS — L89892 Pressure ulcer of other site, stage 2: Secondary | ICD-10-CM | POA: Diagnosis not present

## 2023-11-17 DIAGNOSIS — Z794 Long term (current) use of insulin: Secondary | ICD-10-CM | POA: Diagnosis not present

## 2023-11-17 DIAGNOSIS — I10 Essential (primary) hypertension: Secondary | ICD-10-CM | POA: Diagnosis not present

## 2023-11-17 DIAGNOSIS — Z9181 History of falling: Secondary | ICD-10-CM | POA: Diagnosis not present

## 2023-11-17 DIAGNOSIS — I4891 Unspecified atrial fibrillation: Secondary | ICD-10-CM | POA: Diagnosis not present

## 2023-11-17 DIAGNOSIS — Z7901 Long term (current) use of anticoagulants: Secondary | ICD-10-CM | POA: Diagnosis not present

## 2023-11-17 DIAGNOSIS — Z7984 Long term (current) use of oral hypoglycemic drugs: Secondary | ICD-10-CM | POA: Diagnosis not present

## 2023-11-17 DIAGNOSIS — Z87891 Personal history of nicotine dependence: Secondary | ICD-10-CM | POA: Diagnosis not present

## 2023-11-17 DIAGNOSIS — Z466 Encounter for fitting and adjustment of urinary device: Secondary | ICD-10-CM | POA: Diagnosis not present

## 2023-11-17 DIAGNOSIS — E11649 Type 2 diabetes mellitus with hypoglycemia without coma: Secondary | ICD-10-CM | POA: Diagnosis not present

## 2023-11-17 DIAGNOSIS — R339 Retention of urine, unspecified: Secondary | ICD-10-CM | POA: Diagnosis not present

## 2023-11-18 ENCOUNTER — Encounter: Payer: Self-pay | Admitting: Internal Medicine

## 2023-11-19 ENCOUNTER — Ambulatory Visit: Payer: Medicare HMO | Admitting: Urology

## 2023-11-19 VITALS — BP 115/60 | HR 66

## 2023-11-19 DIAGNOSIS — N401 Enlarged prostate with lower urinary tract symptoms: Secondary | ICD-10-CM | POA: Diagnosis not present

## 2023-11-19 DIAGNOSIS — R338 Other retention of urine: Secondary | ICD-10-CM

## 2023-11-19 DIAGNOSIS — R339 Retention of urine, unspecified: Secondary | ICD-10-CM

## 2023-11-19 NOTE — Progress Notes (Signed)
 Cath Change/ Replacement  Patient is present today for a catheter change due to urinary retention.  10ml of water  was removed from the balloon, a 16FR foley cath was removed without difficulty.  Patient was cleaned and prepped in a sterile fashion with Betadinex3.  A 16 FR foley cath was replaced into the bladder, no complications were noted. Urine return was noted 50ml and urine was Clear yellow in color. The balloon was filled with 10ml of sterile water . A bed bag was attached for drainage.  A night bag was also given to the patient and patient was given instruction on how to change from one bag to another. Patient was given proper instruction on catheter care.    Performed by: Wyonia Hefty, CMA  Follow up:  will call for surgery date.

## 2023-11-19 NOTE — Progress Notes (Signed)
 11/19/2023 1:54 PM   Jim Ramirez April 06, 1951 [id number]  Referring provider: Theoplis Fix, MD [address] [place],  [place] [address]  Followup urinary retention   HPI: Jim Ramirez is a 73yo here for followup for BPH with Urinary retention. UDS showed bladder capacity 650cc. Max detrusor contraction 27cm of water . He currently has an indwelling foley   PMH: 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   CTA negative for aneurysm   Depressed    Diabetes mellitus without complication (HCC)    DVT (deep venous thrombosis) (HCC)    History of kidney stones    Hypertension    Nephrolithiasis    Sleep apnea     Surgical History: Past Surgical History:  Procedure Laterality Date   ANTERIOR CERVICAL DECOMP/DISCECTOMY FUSION N/A 05/22/2022   Procedure: ANTERIOR CERVICAL DISCECTOMY FUSION, INTERBODY PROSTHESIS,PLATE/SCREWS CERVICAL THREE-FOUR, CERVICAL FOUR-FIVE;REMOVAL CERVICAL PLATE;  Surgeon: Garry Kansas, MD;  Location: [facility] OR;  Service: Neurosurgery;  Laterality: N/A;  3C   CATARACT EXTRACTION W/PHACO Left 09/23/2022   Procedure: CATARACT EXTRACTION PHACO AND INTRAOCULAR LENS PLACEMENT (IOC);  Surgeon: Tarri Farm, MD;  Location: [facility];  Service: Ophthalmology;  Laterality: Left;  CDE: 7.52   CATARACT EXTRACTION W/PHACO Right 10/07/2022   Procedure: CATARACT EXTRACTION PHACO AND INTRAOCULAR LENS PLACEMENT (IOC);  Surgeon: Tarri Farm, MD;  Location: [facility];  Service: Ophthalmology;  Laterality: Right;  CDE: 9.40   IR IVC FILTER PLMT / S&I /IMG GUID/MOD SED  01/19/2023   LUMBAR LAMINECTOMY/DECOMPRESSION MICRODISCECTOMY N/A 01/20/2023   Procedure: LUMBAR LAM FOR EPIDURAL ABSCESS, LUMBAR THREE-FOUR, LUMBAR FOUR-FIVE,  LUMBAR FIVE-SACRAL ONE;  Surgeon: Garry Kansas, MD;  Location: [facility] OR;  Service: Neurosurgery;  Laterality: N/A;   STOMACH SURGERY     TEE WITHOUT CARDIOVERSION N/A 12/31/2022   Procedure:  TRANSESOPHAGEAL ECHOCARDIOGRAM;  Surgeon: Wendie Hamburg, MD;  Location: [facility] INVASIVE CV LAB;  Service: Cardiovascular;  Laterality: N/A;   TONSILLECTOMY  2000    Home Medications:  Allergies as of 11/19/2023   No Known Allergies      Medication List        Accurate as of November 19, 2023  1:54 PM. If you have any questions, ask your nurse or doctor.          acetaminophen  325 MG tablet Commonly known as: TYLENOL  Take 2 tablets (650 mg total) by mouth every 6 (six) hours as needed for mild pain (pain score 1-3) (or Fever >/= 101).   albuterol  108 (90 Base) MCG/ACT inhaler Commonly known as: VENTOLIN  HFA Inhale 2 puffs into the lungs every 6 (six) hours as needed for wheezing or shortness of breath.   apixaban  5 MG Tabs tablet Commonly known as: ELIQUIS  Take 1 tablet (5 mg total) by mouth 2 (two) times daily.   ascorbic acid  500 MG tablet Commonly known as: VITAMIN C  Take 500 mg by mouth daily.   Azelastine  HCl 137 MCG/SPRAY Soln Place 2 sprays into both nostrils 2 (two) times daily.   Banophen 25 MG capsule Generic drug: diphenhydrAMINE Take 25 mg by mouth every 6 (six) hours as needed.   BD Veo Insulin  Syringe U/F 31G X 15/64" 0.5 ML Misc Generic drug: Insulin  Syringe-Needle U-100 SMARTSIG:1 Unit(s) 3 Times Daily   betamethasone dipropionate 0.05 % lotion APPLY TO AFFECTED AREA TWICE A DAY   cefadroxil  500 MG capsule Commonly known as: DURICEF Take 2 capsules (1,000 mg total) by mouth 2 (two) times daily. Continue  for long term thru end of May 2025   clotrimazole-betamethasone cream Commonly known as: LOTRISONE Apply 1 Application topically 2 (two) times daily. Apply to sacrum and groin topically for wound care   cyanocobalamin  1000 MCG/ML injection Commonly known as: VITAMIN B12 USE 1 (ONE) MILLILITER ONCE PER MONTH   escitalopram  20 MG tablet Commonly known as: LEXAPRO  Take 20 mg by mouth daily.   esomeprazole 40 MG capsule Commonly known as:  NEXIUM Take 40 mg by mouth daily.   ferrous sulfate  325 (65 FE) MG tablet Take 325 mg by mouth 2 (two) times daily.   hydrochlorothiazide  25 MG tablet Commonly known as: HYDRODIURIL  Take 25 mg by mouth daily.   Jardiance  25 MG Tabs tablet Generic drug: empagliflozin  Take 25 mg by mouth daily.   levocetirizine 5 MG tablet Commonly known as: XYZAL  Take 5 mg by mouth daily.   melatonin 3 MG Tabs tablet Take 3 mg by mouth at bedtime.   metFORMIN  1000 MG tablet Commonly known as: GLUCOPHAGE  Take 1,000 mg by mouth 2 (two) times daily.   metoprolol  succinate 50 MG 24 hr tablet Commonly known as: TOPROL -XL Take 50 mg by mouth daily.   mometasone  0.1 % ointment Commonly known as: ELOCON  Apply topically 2 (two) times daily.   NovoLIN 70/30 (70-30) 100 UNIT/ML injection Generic drug: insulin  NPH-regular Human INJECT 65 - 75 U IN THE MORNING AND 12 U IN THE EVENING, MAX DAILY DOSE: 100 UNIT   OneTouch Delica Plus Lancet33G Misc SMARTSIG:3 Topical Daily   OneTouch Ultra test strip Generic drug: glucose blood 3 (three) times daily.   oxyCODONE  5 MG immediate release tablet Commonly known as: Oxy IR/ROXICODONE  Take 1 tablet (5 mg total) by mouth every 6 (six) hours as needed for moderate pain (pain score 4-6) or severe pain (pain score 7-10).   ProMod Liqd Take 30 mLs by mouth in the morning and at bedtime. For wound healing   rosuvastatin  20 MG tablet Commonly known as: CRESTOR  Take 20 mg by mouth daily.   tamsulosin  0.4 MG Caps capsule Commonly known as: FLOMAX  Take 0.4 mg by mouth daily.   trandolapril  2 MG tablet Commonly known as: MAVIK  Take 2 mg by mouth daily.   Zinc  50 MG Tabs Take 50 mg by mouth daily.   zolpidem  10 MG tablet Commonly known as: AMBIEN  Take 10 mg by mouth at bedtime.        Allergies: No Known Allergies  Family History: Family History  Problem Relation Age of Onset   Heart attack Mother    Heart attack Father    Hypertension  Brother    Diabetes Brother    Coronary artery disease Brother     Social History:  reports that he quit smoking about 31 years ago. His smoking use included cigarettes. He has never been exposed to tobacco smoke. He has never used smokeless tobacco. He reports that he does not currently use alcohol . He reports that he does not use drugs.  ROS: All other review of systems were reviewed and are negative except what is noted above in HPI  Physical Exam: BP 115/60   Pulse 66   Constitutional:  Alert and oriented, No acute distress. HEENT: [place] AT, moist mucus membranes.  Trachea midline, no masses. Cardiovascular: No clubbing, cyanosis, or edema. Respiratory: Normal respiratory effort, no increased work of breathing. GI: Abdomen is soft, nontender, nondistended, no abdominal masses GU: No CVA tenderness.  Lymph: No cervical or inguinal lymphadenopathy. Skin: No rashes, bruises  or suspicious lesions. Neurologic: Grossly intact, no focal deficits, moving all 4 extremities. Psychiatric: Normal mood and affect.  Laboratory Data: Lab Results  Component Value Date   WBC 5.1 05/29/2023   HGB 8.2 (L) 05/29/2023   HCT 27.5 (L) 05/29/2023   MCV 97.5 05/29/2023   PLT 156 05/29/2023    Lab Results  Component Value Date   CREATININE 0.52 (L) 05/28/2023    No results found for: "PSA"  No results found for: "TESTOSTERONE"  Lab Results  Component Value Date   HGBA1C 8.5 (H) 12/24/2022    Urinalysis    Component Value Date/Time   COLORURINE YELLOW 05/22/2023 1743   APPEARANCEUR CLEAR 05/22/2023 1743   LABSPEC 1.010 05/22/2023 1743   PHURINE 6.0 05/22/2023 1743   GLUCOSEU NEGATIVE 05/22/2023 1743   HGBUR NEGATIVE 05/22/2023 1743   BILIRUBINUR NEGATIVE 05/22/2023 1743   KETONESUR NEGATIVE 05/22/2023 1743   PROTEINUR NEGATIVE 05/22/2023 1743   UROBILINOGEN 2.0 (H) 05/09/2010 0945   NITRITE NEGATIVE 05/22/2023 1743   LEUKOCYTESUR NEGATIVE 05/22/2023 1743    Lab Results   Component Value Date   BACTERIA RARE (A) 02/22/2023    Pertinent Imaging:  Results for orders placed during the hospital encounter of 11/17/08  DG Abd 1 View  Narrative Clinical Data: Renal calculus, left side pain  ABDOMEN - 1 VIEW  Comparison: 04/28/2006 Correlation:  CT abdomen 11/17/2008  Findings: Small calculus identified in left kidney on preceding CT not radiographically evident. No definite urinary tract calcification visualized. Bones unremarkable. Bowel gas pattern normal.  IMPRESSION: No urinary tract calcifications identified. Please refer to preceding CT abdomen and pelvis of 11/17/2008.  Provider: Federico Hopkins  No results found for this or any previous visit.  No results found for this or any previous visit.  No results found for this or any previous visit.  No results found for this or any previous visit.  No results found for this or any previous visit.  No results found for this or any previous visit.  No results found for this or any previous visit.   Assessment & Plan:    1. Urinary retention (Primary) Schedule for TURP  2. Benign prostatic hyperplasia with urinary retention We discussed the management of his BPH including continued medical therapy, Rezum, Urolift, TURP and simple prostatectomy. After discussing the options the patient has elected to proceed with TURP. Risks/benefits/alternatives discussed.    No follow-ups on file.  Johnie Nailer, MD  [facility] [place]

## 2023-11-20 ENCOUNTER — Telehealth: Payer: Self-pay | Admitting: *Deleted

## 2023-11-20 NOTE — Telephone Encounter (Signed)
   Pre-operative Risk Assessment    Patient Name: Jim Ramirez  DOB: [birth year] MRN: [id number]   Date of last office visit: 11/14/2023 Date of next office visit: N/A   Request for Surgical Clearance    Procedure:   TRANSURETHRAL RESECTION  Date of Surgery:  Clearance 12/25/23                                Surgeon:  DR. Claretta Croft Surgeon's Group or Practice Name:  [facility] Phone number:  [phone] Fax number:  [phone]   Type of Clearance Requested:   - Medical  - Pharmacy:  Hold Apixaban  (Eliquis ) X'S 2 DAYS PRIOR AND 3 DAYS POST   Type of Anesthesia:  General    Additional requests/questions:    Berenda Breaker   11/20/2023, 11:47 AM

## 2023-11-21 DIAGNOSIS — E11649 Type 2 diabetes mellitus with hypoglycemia without coma: Secondary | ICD-10-CM | POA: Diagnosis not present

## 2023-11-21 DIAGNOSIS — Z9181 History of falling: Secondary | ICD-10-CM | POA: Diagnosis not present

## 2023-11-21 DIAGNOSIS — Z87891 Personal history of nicotine dependence: Secondary | ICD-10-CM | POA: Diagnosis not present

## 2023-11-21 DIAGNOSIS — Z7901 Long term (current) use of anticoagulants: Secondary | ICD-10-CM | POA: Diagnosis not present

## 2023-11-21 DIAGNOSIS — Z794 Long term (current) use of insulin: Secondary | ICD-10-CM | POA: Diagnosis not present

## 2023-11-21 DIAGNOSIS — I10 Essential (primary) hypertension: Secondary | ICD-10-CM | POA: Diagnosis not present

## 2023-11-21 DIAGNOSIS — L89892 Pressure ulcer of other site, stage 2: Secondary | ICD-10-CM | POA: Diagnosis not present

## 2023-11-21 DIAGNOSIS — I4891 Unspecified atrial fibrillation: Secondary | ICD-10-CM | POA: Diagnosis not present

## 2023-11-21 DIAGNOSIS — Z466 Encounter for fitting and adjustment of urinary device: Secondary | ICD-10-CM | POA: Diagnosis not present

## 2023-11-21 DIAGNOSIS — Z7984 Long term (current) use of oral hypoglycemic drugs: Secondary | ICD-10-CM | POA: Diagnosis not present

## 2023-11-21 DIAGNOSIS — R339 Retention of urine, unspecified: Secondary | ICD-10-CM | POA: Diagnosis not present

## 2023-11-21 NOTE — Telephone Encounter (Signed)
 Patient was recently seen by Dr. Armida Lander last week.  Dr. Amanda Jungling, can you comment on whether he is cleared to proceed with upcoming urology procedure?

## 2023-11-21 NOTE — Telephone Encounter (Signed)
 Patient with diagnosis of afib on Eliquis  for anticoagulation.    Procedure:   TRANSURETHRAL RESECTION  Date of procedure: 12/25/23   CHA2DS2-VASc Score = 4   This indicates a 4.8% annual risk of stroke. The patient's score is based upon: CHF History: 0 HTN History: 1 Diabetes History: 1 Stroke History: 0 Vascular Disease History: 1 Age Score: 1 Gender Score: 0      DVT in June 2024, IVC filter placed  CrCl >118ml/min Platelet count 182  Patient has not had an Afib/aflutter ablation within the last 3 months or DCCV within the last 30 days  Per office protocol, patient can hold Eliquis  for 2 days prior to procedure.    **This guidance is not considered finalized until pre-operative APP has relayed final recommendations.**

## 2023-11-24 ENCOUNTER — Telehealth: Payer: Self-pay | Admitting: Urology

## 2023-11-24 ENCOUNTER — Other Ambulatory Visit: Payer: Self-pay

## 2023-11-24 ENCOUNTER — Emergency Department (HOSPITAL_COMMUNITY)
Admission: EM | Admit: 2023-11-24 | Discharge: 2023-11-24 | Disposition: A | Discharge status: Home / Self Care | Attending: Emergency Medicine | Admitting: Emergency Medicine

## 2023-11-24 ENCOUNTER — Encounter (HOSPITAL_COMMUNITY): Payer: Self-pay

## 2023-11-24 DIAGNOSIS — T83091A Other mechanical complication of indwelling urethral catheter, initial encounter: Secondary | ICD-10-CM | POA: Diagnosis not present

## 2023-11-24 DIAGNOSIS — T83098A Other mechanical complication of other indwelling urethral catheter, initial encounter: Secondary | ICD-10-CM | POA: Diagnosis not present

## 2023-11-24 DIAGNOSIS — Y829 Unspecified medical devices associated with adverse incidents: Secondary | ICD-10-CM | POA: Insufficient documentation

## 2023-11-24 DIAGNOSIS — T83011A Breakdown (mechanical) of indwelling urethral catheter, initial encounter: Secondary | ICD-10-CM

## 2023-11-24 LAB — URINALYSIS, ROUTINE W REFLEX MICROSCOPIC
Bacteria, UA: NONE SEEN
Bilirubin Urine: NEGATIVE
Glucose, UA: >=500 mg/dL — AB
Hgb urine dipstick: NEGATIVE
Ketones, ur: NEGATIVE mg/dL
Nitrite: NEGATIVE
Protein, ur: NEGATIVE mg/dL
Specific Gravity, Urine: 1.025 (ref 1.005–1.030)
pH: 5 (ref 5.0–8.0)

## 2023-11-24 NOTE — Telephone Encounter (Signed)
 Patient is made to go to the ER due no available today. Patient is made aware that the cath could possible be clogged. Patient voiced understanding.

## 2023-11-24 NOTE — Telephone Encounter (Signed)
     Primary Cardiologist: Armida Lander, MD  Chart reviewed as part of pre-operative protocol coverage. Given past medical history and time since last visit, based on ACC/AHA guidelines, Jim Ramirez would be at acceptable risk for the planned procedure without further cardiovascular testing.   Patient with diagnosis of afib on Eliquis  for anticoagulation.     Procedure:   TRANSURETHRAL RESECTION  Date of procedure: 12/25/23     CHA2DS2-VASc Score = 4   This indicates a 4.8% annual risk of stroke. The patient's score is based upon: CHF History: 0 HTN History: 1 Diabetes History: 1 Stroke History: 0 Vascular Disease History: 1 Age Score: 1 Gender Score: 0       DVT in June 2024, IVC filter placed   CrCl >137ml/min Platelet count 182   Patient has not had an Afib/aflutter ablation within the last 3 months or DCCV within the last 30 days   Per office protocol, patient can hold Eliquis  for 2 days prior to procedure.   I will route this recommendation to the requesting party via Epic fax function and remove from pre-op pool.  Please call with questions.  Chet Cota. Adir Schicker NP-C     11/24/2023, 11:47 AM [facility] [address] Office [phone] Fax [phone]

## 2023-11-24 NOTE — ED Notes (Signed)
 Pt stated, "last night I couldn't pee. My foley wasn't working."   Foley has been placed by staff. Pt denies pain at this time and yellow clear urine is seen in foley bag.

## 2023-11-24 NOTE — ED Provider Notes (Signed)
  EMERGENCY DEPARTMENT AT [facility] Provider Note   CSN: [id number] Arrival date & time: 11/24/23  1034     History  Chief Complaint  Patient presents with   foley catheter problem    MARKAL CEDILLOS is a 73 y.o. male.  Patient is a 73 year old male who presents to the emergency department stating that his Foley catheter is not draining.  Patient notes that he is only put out 400 cc since yesterday.  Patient notes that he normally empties a full bag every 12 hours.  He did reach out to his urologist who did recommend that he come to the emergency department for further evaluation.  Patient denies any associated fever or chills.  He does admit to suprapubic abdominal discomfort.        Home Medications Prior to Admission medications   Medication Sig Start Date End Date Taking? Authorizing Provider  acetaminophen  (TYLENOL ) 325 MG tablet Take 2 tablets (650 mg total) by mouth every 6 (six) hours as needed for mild pain (pain score 1-3) (or Fever >/= 101). 05/29/23   Colin Dawley, MD  albuterol  (VENTOLIN  HFA) 108 (90 Base) MCG/ACT inhaler Inhale 2 puffs into the lungs every 6 (six) hours as needed for wheezing or shortness of breath. 05/29/23   Colin Dawley, MD  apixaban  (ELIQUIS ) 5 MG TABS tablet Take 1 tablet (5 mg total) by mouth 2 (two) times daily. 05/22/23   Laurann Pollock, MD  ascorbic acid  (VITAMIN C ) 500 MG tablet Take 500 mg by mouth daily.    [provider]  Azelastine  HCl 137 MCG/SPRAY SOLN Place 2 sprays into both nostrils 2 (two) times daily. 10/05/23   [provider]  BANOPHEN 25 MG capsule Take 25 mg by mouth every 6 (six) hours as needed. 10/05/23   [provider]  BD VEO INSULIN  SYRINGE U/F 31G X 15/64" 0.5 ML MISC SMARTSIG:1 Unit(s) 3 Times Daily 07/09/23   [provider]  betamethasone dipropionate 0.05 % lotion APPLY TO AFFECTED AREA TWICE A DAY 07/09/23   [provider]  cefadroxil   (DURICEF) 500 MG capsule Take 2 capsules (1,000 mg total) by mouth 2 (two) times daily. Continue for long term thru end of May 2025 05/29/23   Colin Dawley, MD  clotrimazole-betamethasone (LOTRISONE) cream Apply 1 Application topically 2 (two) times daily. Apply to sacrum and groin topically for wound care    [provider]  cyanocobalamin  (VITAMIN B12) 1000 MCG/ML injection USE 1 (ONE) MILLILITER ONCE PER MONTH 07/09/23   [provider]  escitalopram  (LEXAPRO ) 20 MG tablet Take 20 mg by mouth daily. 07/11/23   [provider]  esomeprazole (NEXIUM) 40 MG capsule Take 40 mg by mouth daily.    [provider]  ferrous sulfate  325 (65 FE) MG tablet Take 325 mg by mouth 2 (two) times daily. 07/11/23   [provider]  hydrochlorothiazide  (HYDRODIURIL ) 25 MG tablet Take 25 mg by mouth daily. 07/09/23   [provider]  JARDIANCE  25 MG TABS tablet Take 25 mg by mouth daily. 10/21/23   [provider]  Lancets (ONETOUCH DELICA PLUS [place]) MISC SMARTSIG:3 Topical Daily 10/06/23   [provider]  levocetirizine (XYZAL ) 5 MG tablet Take 5 mg by mouth daily. 07/11/23   [provider]  melatonin 3 MG TABS tablet Take 3 mg by mouth at bedtime.    [provider]  metFORMIN  (GLUCOPHAGE ) 1000 MG tablet Take 1,000 mg by mouth 2 (two) times  daily. 07/09/23   [provider]  metoprolol  succinate (TOPROL -XL) 50 MG 24 hr tablet Take 50 mg by mouth daily. 11/03/23   [provider]  mometasone  (ELOCON ) 0.1 % ointment Apply topically 2 (two) times daily. 11/03/23   [provider]  NOVOLIN 70/30 (70-30) 100 UNIT/ML injection INJECT 65 - 75 U IN THE MORNING AND 12 U IN THE EVENING, MAX DAILY DOSE: 100 UNIT 07/09/23   [provider]  Nutritional Supplements (PROMOD) LIQD Take 30 mLs by mouth in the morning and at bedtime. For wound healing    [provider]  [facility] ULTRA test  strip 3 (three) times daily. 09/15/23   [provider]  oxyCODONE  (OXY IR/ROXICODONE ) 5 MG immediate release tablet Take 1 tablet (5 mg total) by mouth every 6 (six) hours as needed for moderate pain (pain score 4-6) or severe pain (pain score 7-10). 05/29/23   Colin Dawley, MD  rosuvastatin  (CRESTOR ) 20 MG tablet Take 20 mg by mouth daily. 07/09/23   [provider]  tamsulosin  (FLOMAX ) 0.4 MG CAPS capsule Take 0.4 mg by mouth daily.    [provider]  trandolapril  (MAVIK ) 2 MG tablet Take 2 mg by mouth daily. 07/11/23   [provider]  Zinc  50 MG TABS Take 50 mg by mouth daily.    [provider]  zolpidem  (AMBIEN ) 10 MG tablet Take 10 mg by mouth at bedtime. 07/09/23   [provider]      Allergies    Patient has no known allergies.    Review of Systems   Review of Systems  Genitourinary:        Foley catheter not draining  All other systems reviewed and are negative.   Physical Exam Updated Vital Signs BP 107/63 (BP Location: Right Arm)   Pulse 64   Temp 97.8 F (36.6 C) (Temporal)   Resp 16   Ht 6' (1.829 m)   Wt 79.4 kg   SpO2 98%   BMI 23.73 kg/m  Physical Exam Vitals and nursing note reviewed.  Constitutional:      Appearance: Normal appearance.  HENT:     Head: Normocephalic and atraumatic.     Nose: Nose normal.     Mouth/Throat:     Mouth: Mucous membranes are moist.  Eyes:     Extraocular Movements: Extraocular movements intact.     Conjunctiva/sclera: Conjunctivae normal.     Pupils: Pupils are equal, round, and reactive to light.  Cardiovascular:     Rate and Rhythm: Normal rate and regular rhythm.     Pulses: Normal pulses.     Heart sounds: Normal heart sounds.  Pulmonary:     Effort: Pulmonary effort is normal.     Breath sounds: Normal breath sounds.  Abdominal:     General: Abdomen is flat. Bowel sounds are normal. There is no distension.     Palpations: Abdomen is soft.      Tenderness: There is no abdominal tenderness. There is no guarding.  Musculoskeletal:        General: Normal range of motion.     Cervical back: Normal range of motion and neck supple.  Skin:    General: Skin is warm and dry.  Neurological:     General: No focal deficit present.     Mental Status: He is alert and oriented to person, place, and time. Mental status is at baseline.  Psychiatric:        Mood and Affect: Mood  normal.        Behavior: Behavior normal.        Thought Content: Thought content normal.        Judgment: Judgment normal.     ED Results / Procedures / Treatments   Labs (all labs ordered are listed, but only abnormal results are displayed) Labs Reviewed  URINALYSIS, ROUTINE W REFLEX MICROSCOPIC    EKG None  Radiology No results found.  Procedures Procedures    Medications Ordered in ED Medications - No data to display  ED Course/ Medical Decision Making/ A&P                                 Medical Decision Making Patient is doing well at this time and is stable for discharge home.  Patient did have his Foley catheter replaced in the emergency department.  Symptoms have completely resolved at this time.  Urinalysis demonstrates no indication for urinary tract infection.  Discussed with patient importance of close follow-up with his urologist on an outpatient basis.  Do not suspect any further emergent workup is warranted at this time.  Vital signs are stable with no indication for sepsis.  Patient voiced understanding to the plan and had no additional questions.  Amount and/or Complexity of Data Reviewed Labs: ordered.           Final Clinical Impression(s) / ED Diagnoses Final diagnoses:  None    Rx / DC Orders ED Discharge Orders     None         Emmalene Hare 11/24/23 1151    Dorenda Gandy, MD 11/25/23 1320

## 2023-11-24 NOTE — Discharge Instructions (Signed)
 Please follow-up closely with your urologist on an outpatient basis.  Return to emergency department immediately for any new or worsening symptoms.

## 2023-11-24 NOTE — ED Triage Notes (Signed)
 Pt arrived via POV from home after calling his urology office this morning and being advised to seek treatment in the ER. Pt reports unable to pass urine through his foley catheter since last night. Pt endorses severe groin pain. Pt reports upcoming procedure next month to help him pass urine.

## 2023-11-24 NOTE — Telephone Encounter (Signed)
 Ok to proceed with procedure and hold eliquis  as needed  Letta Raw MD

## 2023-11-24 NOTE — Telephone Encounter (Signed)
 Had cath put in last week and he has not passed any urine since last night at 8pm.

## 2023-11-25 ENCOUNTER — Encounter: Payer: Self-pay | Admitting: Urology

## 2023-11-25 DIAGNOSIS — Z87891 Personal history of nicotine dependence: Secondary | ICD-10-CM | POA: Diagnosis not present

## 2023-11-25 DIAGNOSIS — I4891 Unspecified atrial fibrillation: Secondary | ICD-10-CM | POA: Diagnosis not present

## 2023-11-25 DIAGNOSIS — Z7984 Long term (current) use of oral hypoglycemic drugs: Secondary | ICD-10-CM | POA: Diagnosis not present

## 2023-11-25 DIAGNOSIS — Z9181 History of falling: Secondary | ICD-10-CM | POA: Diagnosis not present

## 2023-11-25 DIAGNOSIS — Z7901 Long term (current) use of anticoagulants: Secondary | ICD-10-CM | POA: Diagnosis not present

## 2023-11-25 DIAGNOSIS — I10 Essential (primary) hypertension: Secondary | ICD-10-CM | POA: Diagnosis not present

## 2023-11-25 DIAGNOSIS — Z466 Encounter for fitting and adjustment of urinary device: Secondary | ICD-10-CM | POA: Diagnosis not present

## 2023-11-25 DIAGNOSIS — E11649 Type 2 diabetes mellitus with hypoglycemia without coma: Secondary | ICD-10-CM | POA: Diagnosis not present

## 2023-11-25 DIAGNOSIS — R339 Retention of urine, unspecified: Secondary | ICD-10-CM | POA: Diagnosis not present

## 2023-11-25 DIAGNOSIS — Z794 Long term (current) use of insulin: Secondary | ICD-10-CM | POA: Diagnosis not present

## 2023-11-25 DIAGNOSIS — L89892 Pressure ulcer of other site, stage 2: Secondary | ICD-10-CM | POA: Diagnosis not present

## 2023-11-25 NOTE — Patient Instructions (Signed)
Transurethral Resection of the Prostate Transurethral resection of the prostate (TURP) is the removal, or resection, of part of the prostate tissue. This procedure is done to treat an enlarged prostate gland (benign prostatic hyperplasia). The goal of TURP is to remove enough prostate tissue to allow for a normal flow of urine. The procedure will allow you to empty your bladder more completely when you urinate so that you can urinate less often. In a transurethral resection, a thin telescope with a light, a camera, and an electric cutting edge (resectoscope) is passed through the urethra and into the prostate. The opening of the urethra is at the end of the penis. Tell a health care provider about: Any allergies you have. All medicines you are taking, including vitamins, herbs, eye drops, creams, and over-the-counter medicines. Any problems you or family members have had with anesthetic medicines. Any bleeding problems you have. Any surgeries you have had. Any medical conditions you have. Any prostate infections you have had. What are the risks? Generally, this is a safe procedure. However, problems may occur, including: Infection. Bleeding. Allergic reactions to medicines. Blood in the urine (hematuria). Damage to nearby structures or organs. Other problems may occur, but they are rare. They include: Dry ejaculation, or having no semen come out during orgasm. Erectile dysfunction, or being unable to have or keep an erection. Scarring that leads to narrowing of the urethra. This narrowing may block the flow of urine. Inability to control when you urinate (incontinence). Deep vein thrombosis. This is a blood clot that can develop in your leg. TURP syndrome. This can happen when you lose too much sodium during or after the procedure. Some signs and symptoms of this condition include: Weakness. Headaches. Nausea or vomiting. Muscle cramping. What happens before the procedure? When to stop  eating and drinking Follow instructions from your health care provider about what you may eat and drink before your procedure. These may include: 8 hours before your procedure Stop eating most foods. Do not eat meat, fried foods, or fatty foods. Eat only light foods, such as toast or crackers. All liquids are okay except energy drinks and alcohol. 6 hours before your procedure Stop eating. Drink only clear liquids, such as water, clear fruit juice, black coffee, plain tea, and sports drinks. Do not drink energy drinks or alcohol. 2 hours before your procedure Stop drinking all liquids. You may be allowed to take medicines with small sips of water. If you do not follow your health care provider's instructions, your procedure may be delayed or canceled. Medicines Ask your health care provider about: Changing or stopping your regular medicines. This is especially important if you are taking diabetes medicines or blood thinners. Taking medicines such as aspirin and ibuprofen. These medicines can thin your blood. Do not take these medicines unless your health care provider tells you to take them. Taking over-the-counter medicines, vitamins, herbs, and supplements. Surgery safety Ask your health care provider what steps will be taken to help prevent infection. These steps may include: Removing hair at the surgery site. Washing skin with a germ-killing soap. Taking antibiotic medicine. General instructions Do not use any products that contain nicotine or tobacco for at least 4 weeks before the procedure. These products include cigarettes, chewing tobacco, and vaping devices, such as e-cigarettes. If you need help quitting, ask your health care provider. If you will be going home right after the procedure, plan to have a responsible adult: Take you home from the hospital or clinic. You  will not be allowed to drive. Care for you for the time you are told. What happens during the procedure?  An  IV will be inserted into one of your veins. You will be given one or more of the following: A medicine to help you relax (sedative). A medicine to make you fall asleep (general anesthetic). A medicine that is injected into your spine to numb the area below and slightly above the injection site (spinal anesthetic). Your legs will be placed in foot rests (stirrups) so that your legs are apart and your knees are bent. The resectoscope will be passed through your urethra to your prostate. Parts of your prostate will be resected using the cutting edge of the resectoscope. Fluid will be passed to rinse out the cut tissues (irrigation). The resectoscope will be removed. A small, thin tube (catheter) will be passed through your urethra and into your bladder. The catheter will drain urine into a bag outside of your body. The procedure may vary among health care providers and hospitals. What happens after the procedure? Your blood pressure, heart rate, breathing rate, and blood oxygen level will be monitored until you leave the hospital or clinic. You will be given fluids through the IV. The IV will be removed when you start eating and drinking normally. You may have some pain. Pain medicine will be available to help you. You will have a catheter draining your urine. You may have blood in your urine. Your catheter may be kept in until your urine is clear. Your urinary drainage will be monitored. If necessary, your bladder may be rinsed out (irrigated) through your catheter. You will be encouraged to walk around as soon as possible. You may have to wear compression stockings. These stockings help to prevent blood clots and reduce swelling in your legs. If you were given a sedative during the procedure, it can affect you for several hours. Do not drive or operate machinery until your health care provider says that it is safe. Summary Transurethral resection of the prostate (TURP) is the removal  (resection) of part of the prostate tissue. The goal of this procedure is to remove enough prostate tissue to allow for a normal flow of urine. Follow instructions from your health care provider about taking medicines and about eating and drinking before the procedure. This information is not intended to replace advice given to you by your health care provider. Make sure you discuss any questions you have with your health care provider. Document Revised: 04/10/2021 Document Reviewed: 04/10/2021 Elsevier Patient Education  2024 [employer].

## 2023-11-27 ENCOUNTER — Other Ambulatory Visit: Payer: Self-pay

## 2023-11-27 ENCOUNTER — Encounter: Payer: Self-pay | Admitting: Internal Medicine

## 2023-11-27 ENCOUNTER — Ambulatory Visit: Admitting: Internal Medicine

## 2023-11-27 VITALS — BP 126/74 | HR 69 | Temp 98.1°F | Ht 72.0 in | Wt 183.0 lb

## 2023-11-27 DIAGNOSIS — M4620 Osteomyelitis of vertebra, site unspecified: Secondary | ICD-10-CM

## 2023-11-27 DIAGNOSIS — E119 Type 2 diabetes mellitus without complications: Secondary | ICD-10-CM | POA: Diagnosis not present

## 2023-11-27 DIAGNOSIS — M6281 Muscle weakness (generalized): Secondary | ICD-10-CM | POA: Diagnosis not present

## 2023-11-27 DIAGNOSIS — I509 Heart failure, unspecified: Secondary | ICD-10-CM | POA: Diagnosis not present

## 2023-11-27 DIAGNOSIS — M4626 Osteomyelitis of vertebra, lumbar region: Secondary | ICD-10-CM | POA: Diagnosis not present

## 2023-11-27 DIAGNOSIS — E785 Hyperlipidemia, unspecified: Secondary | ICD-10-CM | POA: Diagnosis not present

## 2023-11-27 DIAGNOSIS — T847XXD Infection and inflammatory reaction due to other internal orthopedic prosthetic devices, implants and grafts, subsequent encounter: Secondary | ICD-10-CM | POA: Diagnosis not present

## 2023-11-27 DIAGNOSIS — B9561 Methicillin susceptible Staphylococcus aureus infection as the cause of diseases classified elsewhere: Secondary | ICD-10-CM

## 2023-11-27 DIAGNOSIS — J984 Other disorders of lung: Secondary | ICD-10-CM | POA: Diagnosis not present

## 2023-11-27 DIAGNOSIS — R600 Localized edema: Secondary | ICD-10-CM | POA: Diagnosis not present

## 2023-11-27 MED ORDER — CEFADROXIL 500 MG PO CAPS: 500.0000 mg | ORAL_CAPSULE | Freq: Two times a day (BID) | ORAL | 3 refills | Status: AC

## 2023-11-27 NOTE — Patient Instructions (Signed)
 Labs today   Decrease antibiotics cefadroxil  to 500 mg twice a day. You'll need to take this for a very long time if not forever   Follow up with me in 3 months

## 2023-11-27 NOTE — Progress Notes (Signed)
 Patient ID: Jim Ramirez, male   DOB: 1950-12-22, 73 y.o.   MRN: [id number]  HPI 73yo M with admission 11/2022 for disseminated mssa infection including ventriculitis, lumbar spine om, epidural Abscess, right hand abscess and 2nd digit om, negative TEE, S/P on 01/20/23 left L3-4, L4-5 and L5-S1 laminotomy/foraminotomies by dr Larrie Po. Completed iv nafcillin  --> Ancef  on 02/18/2023 and was transitioned to p.o.cefadroxil  1000 mg bid. There was ID plan to do at least 10 months cefadroxil . Of note, no lumbar hardware was placed during lumbar spine surgery but he does have old cspine hardware that wasn't thought to be involved with the mssa infection  He las saw dr Levern Reader 8/14. At that time reported that snf wound care physician placed new picc on 8/14 and give him vanc/cefepime /metronidazole  for deep wound infection.   His blood pressure at the clinic 80s/50s so advised ivf at nursing home   04/01/23 id clinic fu He is f/u from snf with me today I reviewed mar No labs from snf He continues on vanc/cefepime . He also continues on cefadroxil  at 500 mg bid  He is also given lasix  for bilateral LE edema  Wound care has been taking care of left foot/right mid thoracic wound.   He is feeling well no n/v/diarrhea. His left foot is in mild pain   11/27/23 id clinic f/u See a&p for detail Previously on wheel chair, now walking with fww   Outpatient Encounter Medications as of 11/27/2023  Medication Sig   acetaminophen  (TYLENOL ) 325 MG tablet Take 2 tablets (650 mg total) by mouth every 6 (six) hours as needed for mild pain (pain score 1-3) (or Fever >/= 101).   albuterol  (VENTOLIN  HFA) 108 (90 Base) MCG/ACT inhaler Inhale 2 puffs into the lungs every 6 (six) hours as needed for wheezing or shortness of breath.   apixaban  (ELIQUIS ) 5 MG TABS tablet Take 1 tablet (5 mg total) by mouth 2 (two) times daily.   ascorbic acid  (VITAMIN C ) 500 MG tablet Take 500 mg by mouth daily.   Azelastine  HCl 137  MCG/SPRAY SOLN Place 2 sprays into both nostrils 2 (two) times daily.   BANOPHEN 25 MG capsule Take 25 mg by mouth every 6 (six) hours as needed.   BD VEO INSULIN  SYRINGE U/F 31G X 15/64" 0.5 ML MISC SMARTSIG:1 Unit(s) 3 Times Daily   betamethasone dipropionate 0.05 % lotion APPLY TO AFFECTED AREA TWICE A DAY   cefadroxil  (DURICEF) 500 MG capsule Take 2 capsules (1,000 mg total) by mouth 2 (two) times daily. Continue for long term thru end of May 2025   clotrimazole-betamethasone (LOTRISONE) cream Apply 1 Application topically 2 (two) times daily. Apply to sacrum and groin topically for wound care   cyanocobalamin  (VITAMIN B12) 1000 MCG/ML injection USE 1 (ONE) MILLILITER ONCE PER MONTH   escitalopram  (LEXAPRO ) 20 MG tablet Take 20 mg by mouth daily.   esomeprazole (NEXIUM) 40 MG capsule Take 40 mg by mouth daily.   ferrous sulfate  325 (65 FE) MG tablet Take 325 mg by mouth 2 (two) times daily.   hydrochlorothiazide  (HYDRODIURIL ) 25 MG tablet Take 25 mg by mouth daily.   JARDIANCE  25 MG TABS tablet Take 25 mg by mouth daily.   Lancets (ONETOUCH DELICA PLUS LANCET33G) MISC SMARTSIG:3 Topical Daily   levocetirizine (XYZAL ) 5 MG tablet Take 5 mg by mouth daily.   melatonin 3 MG TABS tablet Take 3 mg by mouth at bedtime.   metFORMIN  (GLUCOPHAGE ) 1000 MG tablet Take 1,000 mg  by mouth 2 (two) times daily.   metoprolol  succinate (TOPROL -XL) 50 MG 24 hr tablet Take 50 mg by mouth daily.   mometasone  (ELOCON ) 0.1 % ointment Apply topically 2 (two) times daily.   NOVOLIN 70/30 (70-30) 100 UNIT/ML injection INJECT 65 - 75 U IN THE MORNING AND 12 U IN THE EVENING, MAX DAILY DOSE: 100 UNIT   Nutritional Supplements (PROMOD) LIQD Take 30 mLs by mouth in the morning and at bedtime. For wound healing   ONETOUCH ULTRA test strip 3 (three) times daily.   oxyCODONE  (OXY IR/ROXICODONE ) 5 MG immediate release tablet Take 1 tablet (5 mg total) by mouth every 6 (six) hours as needed for moderate pain (pain score 4-6)  or severe pain (pain score 7-10).   rosuvastatin  (CRESTOR ) 20 MG tablet Take 20 mg by mouth daily.   tamsulosin  (FLOMAX ) 0.4 MG CAPS capsule Take 0.4 mg by mouth daily.   trandolapril  (MAVIK ) 2 MG tablet Take 2 mg by mouth daily.   Zinc  50 MG TABS Take 50 mg by mouth daily.   zolpidem  (AMBIEN ) 10 MG tablet Take 10 mg by mouth at bedtime.   No facility-administered encounter medications on file as of 11/27/2023.     Patient Active Problem List   Diagnosis Date Noted   Chronic low back pain 07/08/2023   Spinal stenosis of lumbar region 07/08/2023   Idiopathic acute pancreatitis without infection or necrosis 05/24/2023   Nausea without vomiting 05/24/2023   SIRS (systemic inflammatory response syndrome) (HCC) 05/22/2023   Hypotension 05/22/2023   Pressure injury of heel, stage 3 (HCC) 05/22/2023   Pressure injury of right thoracic region of back, stage 2 (HCC) 02/24/2023   Hypokalemia 02/22/2023   Paroxysmal atrial fibrillation with RVR (HCC) 02/22/2023   Malnutrition of moderate degree 01/14/2023   MSSA bacteremia 12/26/2022   New onset atrial fibrillation (HCC) 12/24/2022   Discitis of lumbar region 12/24/2022   BPH (benign prostatic hyperplasia) 12/24/2022   Mood disorder (HCC) 12/24/2022   Cervical spondylosis with myelopathy and radiculopathy 05/22/2022   Diabetes mellitus type 2 in nonobese (HCC) 02/26/2008   Dyslipidemia 02/26/2008   Essential hypertension 02/26/2008   Internal hemorrhoids 02/26/2008   External hemorrhoids 02/26/2008   Duodenal ulcer without hemorrhage or perforation and without obstruction 02/26/2008   MELENA 02/26/2008   NECK PAIN 02/26/2008   INSOMNIA 02/26/2008   Sleep apnea 02/26/2008   Abdominal pain 02/26/2008   RECTAL BLEEDING, HX OF 02/26/2008     Health Maintenance Due  Topic Date Due   Medicare Annual Wellness (AWV)  Never done   COVID-19 Vaccine (1) Never done   FOOT EXAM  Never done   OPHTHALMOLOGY EXAM  Never done   Diabetic kidney  evaluation - Urine ACR  Never done   Pneumonia Vaccine 20+ Years old (1 of 2 - PCV) Never done   Zoster Vaccines- Shingrix (1 of 2) Never done   DTaP/Tdap/Td (1 - Tdap) 03/31/1997   Colonoscopy  Never done   HEMOGLOBIN A1C  06/26/2023     Review of Systems + wound to thoracic wall. 12 point ros is otherwise negative Physical Exam   BP 126/74   Pulse 69   Temp 98.1 F (36.7 C) (Oral)   Ht 6' (1.829 m)   Wt 183 lb (83 kg)   SpO2 98%   BMI 24.82 kg/m   Gen = a x o by 3 in NAD HEENT = moist mucous membranes Skin -- scattered exocriation left upper back where he was  scratching Neuro -- nonfocal; walks with fww Cv -- rr no mrg   CBC Lab Results  Component Value Date   WBC 5.1 05/29/2023   RBC 2.82 (L) 05/29/2023   HGB 8.2 (L) 05/29/2023   HCT 27.5 (L) 05/29/2023   PLT 156 05/29/2023   MCV 97.5 05/29/2023   MCH 29.1 05/29/2023   MCHC 29.8 (L) 05/29/2023   RDW 15.4 05/29/2023   LYMPHSABS 1.7 05/23/2023   MONOABS 0.4 05/23/2023   EOSABS 0.0 05/23/2023    BMET Lab Results  Component Value Date   NA 138 05/28/2023   K 4.2 05/28/2023   CL 106 05/28/2023   CO2 27 05/28/2023   GLUCOSE 114 (H) 05/28/2023   BUN 5 (L) 05/28/2023   CREATININE 0.52 (L) 05/28/2023   CALCIUM  7.7 (L) 05/28/2023   GFRNONAA >60 05/28/2023   GFRAA  05/09/2010    >60        The eGFR has been calculated using the MDRD equation. This calculation has not been validated in all clinical situations. eGFR's persistently <60 mL/min signify possible Chronic Kidney Disease.      Assessment and Plan Hypotension = currently asymptomatic but concern he is below his baseline. Gave verbal order to SNF  for ivf - 2L NS over 2-3 hrs  plus initiation of abtx.He is to go to ED for evaluation if SbP is 100 or <100. Verbal order to provider at SNF. Recommend to be evaluated by NP at SNF in the morning.  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 Has wound care physician provision.   ---------- 04/01/23 id clinic assessment Patient's bp remains relatively low and he is not symptomatic Wound team at snf had started vanc/cefepime  8/14 and he continues on that. He remains on cefadroxil  which I'll hold for now while on vanc/cefepime   I do not think with the back wound looking like it is he needs further vanc/cefepime  but will defer the the wound care physician who is managing that along with opat labs. They are managing wounds as well which today 04/01/23 the back wound looks great  When he is done with the vanc/cefepime  he can restart cefadroxil  1000 mg po bid again in accordance with prior ID plan. He does have old cspine hardware which at time of mssa infection 11/2022 was (corrected to was from wasn't) thought to be involved    ID f/u in 2-3 weeks   04/25/23 id clinic assessment No complaint today Getting wound care for the back wound   He has chronic wound bilateral feet that haven't been infected and didn't examine today  Patient is back on cefadroxil  1000 mg po bid starting 9/13; iv abx stopped; picc not present Snf labs provided today: 04/24/23 cbc 5.8/10/189; prealbumin 7 04/05/23 cr 0.8  Encourage supplemental boost three times a day with meals to improve nutrition Continue wound care   F/u dr Levern Reader in 8-10 weeks for routine care/abx monitoring   11/27/23 id clinic assessment Patient just left nursing home  He is taking cefadroxil  1000 mg bid still No back pain No f/c No n/v Walking with fww (before this happened was walking/running 15 miles a day)  Dx -- lumbar spine mssa hardware associated infection  Continues cefadroxil  500 mg bid Labs today  F/u 3 months given decrease cefadroxil  dose  Chart copied to dr Theoplis Fix PCP

## 2023-11-28 LAB — COMPLETE METABOLIC PANEL WITHOUT GFR
AG Ratio: 1.4 (calc) (ref 1.0–2.5)
ALT: 11 U/L (ref 9–46)
AST: 13 U/L (ref 10–35)
Albumin: 4.2 g/dL (ref 3.6–5.1)
Alkaline phosphatase (APISO): 99 U/L (ref 35–144)
BUN: 17 mg/dL (ref 7–25)
CO2: 30 mmol/L (ref 20–32)
Calcium: 9.6 mg/dL (ref 8.6–10.3)
Chloride: 102 mmol/L (ref 98–110)
Creat: 0.75 mg/dL (ref 0.70–1.28)
Globulin: 2.9 g/dL (ref 1.9–3.7)
Glucose, Bld: 145 mg/dL — ABNORMAL HIGH (ref 65–99)
Potassium: 4.3 mmol/L (ref 3.5–5.3)
Sodium: 140 mmol/L (ref 135–146)
Total Bilirubin: 0.5 mg/dL (ref 0.2–1.2)
Total Protein: 7.1 g/dL (ref 6.1–8.1)

## 2023-11-28 LAB — CBC
HCT: 39.9 % (ref 38.5–50.0)
Hemoglobin: 13.1 g/dL — ABNORMAL LOW (ref 13.2–17.1)
MCH: 28.6 pg (ref 27.0–33.0)
MCHC: 32.8 g/dL (ref 32.0–36.0)
MCV: 87.1 fL (ref 80.0–100.0)
MPV: 10.1 fL (ref 7.5–12.5)
Platelets: 70 10*3/uL — ABNORMAL LOW (ref 140–400)
RBC: 4.58 10*6/uL (ref 4.20–5.80)
RDW: 14.1 % (ref 11.0–15.0)
WBC: 8.5 10*3/uL (ref 3.8–10.8)

## 2023-11-28 LAB — C-REACTIVE PROTEIN: CRP: 10.4 mg/L — ABNORMAL HIGH (ref <8.0)

## 2023-12-01 ENCOUNTER — Telehealth: Payer: Self-pay

## 2023-12-01 NOTE — Telephone Encounter (Addendum)
 Faxed order for CBC to be done at patient's pcp office in [place].  Patient aware to have it drawn within 2 weeks and will call us  after labs are drawn.  Fax# [phone]

## 2023-12-01 NOTE — Telephone Encounter (Signed)
 I spoke to the patient and informed him of lab results. Patient would like to have the labs drawn in [place] at his pcp office.  I have left a message for Chardae with patient's pcp  Dr. Mason Sole to see if they will draw a repeat CBC for the patient if we fax the order.  Ph#  [phone] Pasha Broad Adel Holt, CMA

## 2023-12-01 NOTE — Telephone Encounter (Signed)
 Thank you :)

## 2023-12-01 NOTE — Telephone Encounter (Signed)
-----   Message from [place] sent at 11/28/2023  1:35 PM EDT ----- Hi team Please let him know platelet a little low  We should repeat testing in a couple weeks   Could you order a CBC for 2 weeks under my name and so we will see what it is  If lower might need to look at antibiotics side effects   Thanks Trung

## 2023-12-02 ENCOUNTER — Ambulatory Visit (INDEPENDENT_AMBULATORY_CARE_PROVIDER_SITE_OTHER)

## 2023-12-02 ENCOUNTER — Telehealth: Payer: Self-pay

## 2023-12-02 DIAGNOSIS — R339 Retention of urine, unspecified: Secondary | ICD-10-CM | POA: Diagnosis not present

## 2023-12-02 MED ORDER — CIPROFLOXACIN HCL 500 MG PO TABS
500.0000 mg | ORAL_TABLET | Freq: Once | ORAL | Status: AC
  Administered 2023-12-02: 500 mg via ORAL

## 2023-12-02 NOTE — Patient Instructions (Signed)

## 2023-12-02 NOTE — Telephone Encounter (Signed)
 Patient aware and scheduled for NV for 3pm

## 2023-12-02 NOTE — Progress Notes (Addendum)
 Patient present in office today with clogged foley catheter. Unable to flush.  Per MD ok to upsize to 20. Patient encouraged to increase fluids.  Cath Change/ Replacement  Patient is present today for a catheter change due to urinary retention.  9ml of water  was removed from the balloon, a 16FR foley cath was removed without difficulty.  Patient was cleaned and prepped in a sterile fashion with Betadinex3.  A 20 FR foley cath was replaced into the bladder, no complications were noted. Urine return was noted and urine was  yellow with some sediment  in color. The balloon was filled with 10ml of sterile water . A bedside bag was attached for drainage. Patient was given proper instruction on catheter care.    Performed by: Vidur Knust LPN  Follow up:  for surgery

## 2023-12-02 NOTE — Telephone Encounter (Signed)
 Patient called with complaints of catheter not draining.  He states he is having discomfort in his lower abdomen and he is voiding around his catheter, very little is draining in his bad.  Do you recommend a rx for bladder spasms?

## 2023-12-03 DIAGNOSIS — L89103 Pressure ulcer of unspecified part of back, stage 3: Secondary | ICD-10-CM | POA: Diagnosis not present

## 2023-12-03 DIAGNOSIS — L97421 Non-pressure chronic ulcer of left heel and midfoot limited to breakdown of skin: Secondary | ICD-10-CM | POA: Diagnosis not present

## 2023-12-04 DIAGNOSIS — H26493 Other secondary cataract, bilateral: Secondary | ICD-10-CM | POA: Diagnosis not present

## 2023-12-04 DIAGNOSIS — Z961 Presence of intraocular lens: Secondary | ICD-10-CM | POA: Diagnosis not present

## 2023-12-04 DIAGNOSIS — H33032 Retinal detachment with giant retinal tear, left eye: Secondary | ICD-10-CM | POA: Diagnosis not present

## 2023-12-05 DIAGNOSIS — H31092 Other chorioretinal scars, left eye: Secondary | ICD-10-CM | POA: Diagnosis not present

## 2023-12-05 DIAGNOSIS — Z961 Presence of intraocular lens: Secondary | ICD-10-CM | POA: Diagnosis not present

## 2023-12-05 DIAGNOSIS — H35033 Hypertensive retinopathy, bilateral: Secondary | ICD-10-CM | POA: Diagnosis not present

## 2023-12-05 DIAGNOSIS — H35372 Puckering of macula, left eye: Secondary | ICD-10-CM | POA: Diagnosis not present

## 2023-12-05 DIAGNOSIS — E113313 Type 2 diabetes mellitus with moderate nonproliferative diabetic retinopathy with macular edema, bilateral: Secondary | ICD-10-CM | POA: Diagnosis not present

## 2023-12-05 DIAGNOSIS — H43812 Vitreous degeneration, left eye: Secondary | ICD-10-CM | POA: Diagnosis not present

## 2023-12-05 DIAGNOSIS — H43821 Vitreomacular adhesion, right eye: Secondary | ICD-10-CM | POA: Diagnosis not present

## 2023-12-15 DIAGNOSIS — I10 Essential (primary) hypertension: Secondary | ICD-10-CM | POA: Diagnosis not present

## 2023-12-16 DIAGNOSIS — R1030 Lower abdominal pain, unspecified: Secondary | ICD-10-CM | POA: Diagnosis not present

## 2023-12-16 DIAGNOSIS — R102 Pelvic and perineal pain: Secondary | ICD-10-CM | POA: Diagnosis not present

## 2023-12-16 DIAGNOSIS — Z794 Long term (current) use of insulin: Secondary | ICD-10-CM | POA: Diagnosis not present

## 2023-12-16 DIAGNOSIS — Z87891 Personal history of nicotine dependence: Secondary | ICD-10-CM | POA: Diagnosis not present

## 2023-12-16 DIAGNOSIS — E119 Type 2 diabetes mellitus without complications: Secondary | ICD-10-CM | POA: Diagnosis not present

## 2023-12-16 DIAGNOSIS — X58XXXA Exposure to other specified factors, initial encounter: Secondary | ICD-10-CM | POA: Diagnosis not present

## 2023-12-16 DIAGNOSIS — Z7901 Long term (current) use of anticoagulants: Secondary | ICD-10-CM | POA: Diagnosis not present

## 2023-12-16 DIAGNOSIS — T83098A Other mechanical complication of other indwelling urethral catheter, initial encounter: Secondary | ICD-10-CM | POA: Diagnosis not present

## 2023-12-16 DIAGNOSIS — I10 Essential (primary) hypertension: Secondary | ICD-10-CM | POA: Diagnosis not present

## 2023-12-16 DIAGNOSIS — Z79899 Other long term (current) drug therapy: Secondary | ICD-10-CM | POA: Diagnosis not present

## 2023-12-16 DIAGNOSIS — Z86718 Personal history of other venous thrombosis and embolism: Secondary | ICD-10-CM | POA: Diagnosis not present

## 2023-12-23 ENCOUNTER — Telehealth: Payer: Self-pay

## 2023-12-23 ENCOUNTER — Encounter (HOSPITAL_COMMUNITY)
Admission: RE | Admit: 2023-12-23 | Discharge: 2023-12-23 | Disposition: A | Discharge status: Home / Self Care | Source: Ambulatory Visit | Attending: Urology | Admitting: Urology

## 2023-12-23 ENCOUNTER — Ambulatory Visit (INDEPENDENT_AMBULATORY_CARE_PROVIDER_SITE_OTHER)

## 2023-12-23 ENCOUNTER — Encounter (HOSPITAL_COMMUNITY): Payer: Self-pay

## 2023-12-23 DIAGNOSIS — R339 Retention of urine, unspecified: Secondary | ICD-10-CM | POA: Diagnosis not present

## 2023-12-23 HISTORY — DX: Other complications of anesthesia, initial encounter: T88.59XA

## 2023-12-23 HISTORY — DX: Unspecified atrial fibrillation: I48.91

## 2023-12-23 NOTE — Progress Notes (Signed)
 Patient came in for block cath. Patient catheter balloon was deflated and cath was reinserted. Balloon was then inflated and  catheter flushed with 70 ml of sterile water . After flushed, patient was able to voided 700 ml with sediment.

## 2023-12-23 NOTE — Telephone Encounter (Signed)
 Patient states he has not heard from [facility] for his PAT appt.  I informed him he is scheduled for a phone call today, to call our office back if does not hear from them by 330pm.  Patient also states his catheter is not draining, he has had to go to [facility] over the weekend and he noticed his catheter is not adequately draining today.  Patient denies pain at this time. He was added to NV for catheter check/ flush, may need to be upsized to 20 F as previously charted in last NV note.

## 2023-12-25 ENCOUNTER — Encounter (HOSPITAL_COMMUNITY): Payer: Self-pay | Admitting: Urology

## 2023-12-25 ENCOUNTER — Ambulatory Visit (HOSPITAL_COMMUNITY): Admitting: Certified Registered"

## 2023-12-25 ENCOUNTER — Observation Stay (HOSPITAL_COMMUNITY)
Admission: RE | Admit: 2023-12-25 | Discharge: 2023-12-26 | Disposition: A | Discharge status: Home / Self Care | Attending: Urology | Admitting: Urology

## 2023-12-25 ENCOUNTER — Ambulatory Visit (HOSPITAL_BASED_OUTPATIENT_CLINIC_OR_DEPARTMENT_OTHER): Admitting: Certified Registered"

## 2023-12-25 ENCOUNTER — Encounter (HOSPITAL_COMMUNITY)
Admission: RE | Disposition: A | Discharge status: Home / Self Care | Payer: Self-pay | Source: Home / Self Care | Attending: Urology

## 2023-12-25 DIAGNOSIS — G4730 Sleep apnea, unspecified: Secondary | ICD-10-CM | POA: Diagnosis not present

## 2023-12-25 DIAGNOSIS — N138 Other obstructive and reflux uropathy: Principal | ICD-10-CM

## 2023-12-25 DIAGNOSIS — I480 Paroxysmal atrial fibrillation: Secondary | ICD-10-CM | POA: Diagnosis not present

## 2023-12-25 DIAGNOSIS — Z86718 Personal history of other venous thrombosis and embolism: Secondary | ICD-10-CM | POA: Diagnosis not present

## 2023-12-25 DIAGNOSIS — I10 Essential (primary) hypertension: Secondary | ICD-10-CM

## 2023-12-25 DIAGNOSIS — R338 Other retention of urine: Secondary | ICD-10-CM

## 2023-12-25 DIAGNOSIS — N401 Enlarged prostate with lower urinary tract symptoms: Secondary | ICD-10-CM | POA: Diagnosis not present

## 2023-12-25 DIAGNOSIS — Z794 Long term (current) use of insulin: Secondary | ICD-10-CM | POA: Insufficient documentation

## 2023-12-25 DIAGNOSIS — E119 Type 2 diabetes mellitus without complications: Secondary | ICD-10-CM | POA: Insufficient documentation

## 2023-12-25 DIAGNOSIS — Z87891 Personal history of nicotine dependence: Secondary | ICD-10-CM | POA: Diagnosis not present

## 2023-12-25 DIAGNOSIS — I4891 Unspecified atrial fibrillation: Secondary | ICD-10-CM | POA: Insufficient documentation

## 2023-12-25 DIAGNOSIS — C61 Malignant neoplasm of prostate: Principal | ICD-10-CM

## 2023-12-25 DIAGNOSIS — N400 Enlarged prostate without lower urinary tract symptoms: Secondary | ICD-10-CM | POA: Diagnosis present

## 2023-12-25 DIAGNOSIS — Z7984 Long term (current) use of oral hypoglycemic drugs: Secondary | ICD-10-CM | POA: Diagnosis not present

## 2023-12-25 HISTORY — PX: TRANSURETHRAL RESECTION OF PROSTATE: SHX73

## 2023-12-25 LAB — BASIC METABOLIC PANEL WITH GFR
Anion gap: 10 (ref 5–15)
BUN: 19 mg/dL (ref 8–23)
CO2: 24 mmol/L (ref 22–32)
Calcium: 8.7 mg/dL — ABNORMAL LOW (ref 8.9–10.3)
Chloride: 103 mmol/L (ref 98–111)
Creatinine, Ser: 0.74 mg/dL (ref 0.61–1.24)
GFR, Estimated: >60 mL/min (ref >60)
Glucose, Bld: 177 mg/dL — ABNORMAL HIGH (ref 70–99)
Potassium: 3.7 mmol/L (ref 3.5–5.1)
Sodium: 137 mmol/L (ref 135–145)

## 2023-12-25 LAB — GLUCOSE, CAPILLARY
Glucose-Capillary: 128 mg/dL — ABNORMAL HIGH (ref 70–99)
Glucose-Capillary: 141 mg/dL — ABNORMAL HIGH (ref 70–99)
Glucose-Capillary: 165 mg/dL — ABNORMAL HIGH (ref 70–99)
Glucose-Capillary: 177 mg/dL — ABNORMAL HIGH (ref 70–99)

## 2023-12-25 LAB — CBC
HCT: 36 % — ABNORMAL LOW (ref 39.0–52.0)
Hemoglobin: 11.9 g/dL — ABNORMAL LOW (ref 13.0–17.0)
MCH: 29.6 pg (ref 26.0–34.0)
MCHC: 33.1 g/dL (ref 30.0–36.0)
MCV: 89.6 fL (ref 80.0–100.0)
Platelets: 125 10*3/uL — ABNORMAL LOW (ref 150–400)
RBC: 4.02 MIL/uL — ABNORMAL LOW (ref 4.22–5.81)
RDW: 14.1 % (ref 11.5–15.5)
WBC: 8.8 10*3/uL (ref 4.0–10.5)
nRBC: 0 % (ref 0.0–0.2)

## 2023-12-25 LAB — HEMOGLOBIN A1C
Hgb A1c MFr Bld: 8.8 % — ABNORMAL HIGH (ref 4.8–5.6)
Mean Plasma Glucose: 205.86 mg/dL

## 2023-12-25 SURGERY — TURP (TRANSURETHRAL RESECTION OF PROSTATE)
Anesthesia: General | Site: Prostate

## 2023-12-25 MED ORDER — SODIUM CHLORIDE 0.9 % IV SOLN: INTRAVENOUS | Status: DC | PRN

## 2023-12-25 MED ORDER — HYDROMORPHONE HCL 1 MG/ML IJ SOLN: 0.5000 mg | INTRAMUSCULAR | Status: DC | PRN

## 2023-12-25 MED ORDER — ZOLPIDEM TARTRATE 5 MG PO TABS: 5.0000 mg | ORAL_TABLET | Freq: Every evening | ORAL | Status: DC | PRN

## 2023-12-25 MED ORDER — AZELASTINE HCL 0.1 % NA SOLN
2.0000 | Freq: Two times a day (BID) | NASAL | Status: DC
  Administered 2023-12-26: 2 via NASAL
  Filled 2023-12-25: qty 30

## 2023-12-25 MED ORDER — OXYCODONE HCL 5 MG/5ML PO SOLN: 5.0000 mg | Freq: Once | ORAL | Status: DC | PRN

## 2023-12-25 MED ORDER — GLYCOPYRROLATE PF 0.2 MG/ML IJ SOSY
PREFILLED_SYRINGE | INTRAMUSCULAR | Status: DC | PRN
  Administered 2023-12-25 (×2): .1 mg via INTRAVENOUS

## 2023-12-25 MED ORDER — PHENYLEPHRINE 80 MCG/ML (10ML) SYRINGE FOR IV PUSH (FOR BLOOD PRESSURE SUPPORT)
PREFILLED_SYRINGE | INTRAVENOUS | Status: DC | PRN
  Administered 2023-12-25 (×5): 160 ug via INTRAVENOUS

## 2023-12-25 MED ORDER — PHENYLEPHRINE HCL-NACL 20-0.9 MG/250ML-% IV SOLN
INTRAVENOUS | Status: AC
  Filled 2023-12-25: qty 250

## 2023-12-25 MED ORDER — ACETAMINOPHEN 325 MG PO TABS
650.0000 mg | ORAL_TABLET | ORAL | Status: DC | PRN
Start: 2023-12-25 — End: 2023-12-26

## 2023-12-25 MED ORDER — METOPROLOL SUCCINATE ER 50 MG PO TB24
50.0000 mg | ORAL_TABLET | Freq: Every day | ORAL | Status: DC
  Administered 2023-12-26: 50 mg via ORAL
  Filled 2023-12-25: qty 1

## 2023-12-25 MED ORDER — HYDROCHLOROTHIAZIDE 25 MG PO TABS
25.0000 mg | ORAL_TABLET | Freq: Every day | ORAL | Status: DC
  Administered 2023-12-26: 25 mg via ORAL
  Filled 2023-12-25 (×2): qty 1

## 2023-12-25 MED ORDER — INSULIN ASPART 100 UNIT/ML IJ SOLN: 0.0000 [IU] | Freq: Every day | INTRAMUSCULAR | Status: DC

## 2023-12-25 MED ORDER — TRANDOLAPRIL 1 MG PO TABS
2.0000 mg | ORAL_TABLET | Freq: Every day | ORAL | Status: DC
  Administered 2023-12-26: 2 mg via ORAL
  Filled 2023-12-25 (×2): qty 2

## 2023-12-25 MED ORDER — FENTANYL CITRATE (PF) 100 MCG/2ML IJ SOLN
INTRAMUSCULAR | Status: DC | PRN
  Administered 2023-12-25 (×4): 25 ug via INTRAVENOUS

## 2023-12-25 MED ORDER — FENTANYL CITRATE PF 50 MCG/ML IJ SOSY: 25.0000 ug | PREFILLED_SYRINGE | INTRAMUSCULAR | Status: DC | PRN

## 2023-12-25 MED ORDER — PHENYLEPHRINE 80 MCG/ML (10ML) SYRINGE FOR IV PUSH (FOR BLOOD PRESSURE SUPPORT)
PREFILLED_SYRINGE | INTRAVENOUS | Status: AC
Start: 2023-12-25 — End: ?
  Filled 2023-12-25: qty 10

## 2023-12-25 MED ORDER — EPHEDRINE 5 MG/ML INJ
INTRAVENOUS | Status: AC
Start: 2023-12-25 — End: ?
  Filled 2023-12-25: qty 5

## 2023-12-25 MED ORDER — PHENYLEPHRINE HCL-NACL 20-0.9 MG/250ML-% IV SOLN
INTRAVENOUS | Status: DC | PRN
Start: 2023-12-25 — End: 2023-12-25
  Administered 2023-12-25: 60 ug/min via INTRAVENOUS

## 2023-12-25 MED ORDER — LIDOCAINE 2% (20 MG/ML) 5 ML SYRINGE
INTRAMUSCULAR | Status: DC | PRN
  Administered 2023-12-25: 100 mg via INTRAVENOUS

## 2023-12-25 MED ORDER — SODIUM CHLORIDE 0.9 % IV SOLN
INTRAVENOUS | Status: DC
Start: 2023-12-25 — End: 2023-12-26

## 2023-12-25 MED ORDER — FENTANYL CITRATE (PF) 100 MCG/2ML IJ SOLN
INTRAMUSCULAR | Status: AC
Start: 2023-12-25 — End: ?
  Filled 2023-12-25: qty 2

## 2023-12-25 MED ORDER — OXYBUTYNIN CHLORIDE 5 MG PO TABS
5.0000 mg | ORAL_TABLET | Freq: Three times a day (TID) | ORAL | Status: DC | PRN
Start: 2023-12-25 — End: 2023-12-26

## 2023-12-25 MED ORDER — OXYCODONE HCL 5 MG PO TABS: 5.0000 mg | ORAL_TABLET | Freq: Once | ORAL | Status: DC | PRN

## 2023-12-25 MED ORDER — CHLORHEXIDINE GLUCONATE 0.12 % MT SOLN: 15.0000 mL | Freq: Once | OROMUCOSAL | Status: AC

## 2023-12-25 MED ORDER — ONDANSETRON HCL 4 MG/2ML IJ SOLN: 4.0000 mg | Freq: Once | INTRAMUSCULAR | Status: DC | PRN

## 2023-12-25 MED ORDER — ESCITALOPRAM OXALATE 10 MG PO TABS
20.0000 mg | ORAL_TABLET | Freq: Every day | ORAL | Status: DC
  Administered 2023-12-26: 20 mg via ORAL
  Filled 2023-12-25: qty 2

## 2023-12-25 MED ORDER — EPHEDRINE SULFATE-NACL 50-0.9 MG/10ML-% IV SOSY
PREFILLED_SYRINGE | INTRAVENOUS | Status: DC | PRN
  Administered 2023-12-25: 10 mg via INTRAVENOUS
  Administered 2023-12-25: 5 mg via INTRAVENOUS
  Administered 2023-12-25: 10 mg via INTRAVENOUS

## 2023-12-25 MED ORDER — PANTOPRAZOLE SODIUM 40 MG PO TBEC
40.0000 mg | DELAYED_RELEASE_TABLET | Freq: Every day | ORAL | Status: DC
  Administered 2023-12-26: 40 mg via ORAL
  Filled 2023-12-25 (×2): qty 1

## 2023-12-25 MED ORDER — PROPOFOL 10 MG/ML IV BOLUS
INTRAVENOUS | Status: DC | PRN
  Administered 2023-12-25: 140 mg via INTRAVENOUS
  Administered 2023-12-25: 50 mg via INTRAVENOUS
  Administered 2023-12-25: 60 mg via INTRAVENOUS

## 2023-12-25 MED ORDER — CEFADROXIL 500 MG PO CAPS
500.0000 mg | ORAL_CAPSULE | Freq: Two times a day (BID) | ORAL | Status: DC
  Administered 2023-12-25 – 2023-12-26 (×3): 500 mg via ORAL
  Filled 2023-12-25 (×5): qty 1

## 2023-12-25 MED ORDER — ONDANSETRON HCL 4 MG/2ML IJ SOLN: 4.0000 mg | INTRAMUSCULAR | Status: DC | PRN

## 2023-12-25 MED ORDER — SODIUM CHLORIDE 0.9 % IR SOLN
Status: DC | PRN
Start: 2023-12-25 — End: 2023-12-25
  Administered 2023-12-25 (×3): 3000 mL

## 2023-12-25 MED ORDER — OXYCODONE HCL 5 MG PO TABS
5.0000 mg | ORAL_TABLET | ORAL | Status: DC | PRN
  Administered 2023-12-25 (×2): 5 mg via ORAL
  Filled 2023-12-25 (×2): qty 1

## 2023-12-25 MED ORDER — WATER FOR IRRIGATION, STERILE IR SOLN
Status: DC | PRN
  Administered 2023-12-25: 500 mL

## 2023-12-25 MED ORDER — DIPHENHYDRAMINE HCL 12.5 MG/5ML PO ELIX: 12.5000 mg | ORAL_SOLUTION | Freq: Four times a day (QID) | ORAL | Status: DC | PRN

## 2023-12-25 MED ORDER — SODIUM CHLORIDE 0.9 % IV SOLN
2.0000 g | INTRAVENOUS | Status: AC
  Administered 2023-12-25: 2 g via INTRAVENOUS
  Filled 2023-12-25: qty 20

## 2023-12-25 MED ORDER — SODIUM CHLORIDE 0.9 % IR SOLN
3000.0000 mL | Status: DC
  Administered 2023-12-25 – 2023-12-26 (×3): 3000 mL

## 2023-12-25 MED ORDER — ROSUVASTATIN CALCIUM 20 MG PO TABS
20.0000 mg | ORAL_TABLET | Freq: Every day | ORAL | Status: DC
  Administered 2023-12-25 – 2023-12-26 (×2): 20 mg via ORAL
  Filled 2023-12-25 (×2): qty 1

## 2023-12-25 MED ORDER — INSULIN ASPART 100 UNIT/ML IJ SOLN
4.0000 [IU] | Freq: Three times a day (TID) | INTRAMUSCULAR | Status: DC
  Administered 2023-12-25 – 2023-12-26 (×3): 4 [IU] via SUBCUTANEOUS

## 2023-12-25 MED ORDER — LIDOCAINE 2% (20 MG/ML) 5 ML SYRINGE
INTRAMUSCULAR | Status: AC
  Filled 2023-12-25: qty 5

## 2023-12-25 MED ORDER — ONDANSETRON HCL 4 MG/2ML IJ SOLN
INTRAMUSCULAR | Status: DC | PRN
  Administered 2023-12-25: 4 mg via INTRAVENOUS

## 2023-12-25 MED ORDER — INSULIN ASPART 100 UNIT/ML IJ SOLN
0.0000 [IU] | Freq: Three times a day (TID) | INTRAMUSCULAR | Status: DC
  Administered 2023-12-25: 2 [IU] via SUBCUTANEOUS
  Administered 2023-12-26: 3 [IU] via SUBCUTANEOUS
  Administered 2023-12-26: 2 [IU] via SUBCUTANEOUS

## 2023-12-25 MED ORDER — GLYCOPYRROLATE PF 0.2 MG/ML IJ SOSY
PREFILLED_SYRINGE | INTRAMUSCULAR | Status: AC
  Filled 2023-12-25: qty 1

## 2023-12-25 MED ORDER — SENNOSIDES-DOCUSATE SODIUM 8.6-50 MG PO TABS
2.0000 | ORAL_TABLET | Freq: Every day | ORAL | Status: DC
Start: 2023-12-25 — End: 2023-12-26
  Administered 2023-12-25: 2 via ORAL
  Filled 2023-12-25: qty 2

## 2023-12-25 MED ORDER — ONDANSETRON HCL 4 MG/2ML IJ SOLN
INTRAMUSCULAR | Status: AC
  Filled 2023-12-25: qty 2

## 2023-12-25 MED ORDER — CHLORHEXIDINE GLUCONATE 0.12 % MT SOLN
OROMUCOSAL | Status: AC
  Administered 2023-12-25: 15 mL via OROMUCOSAL
  Filled 2023-12-25: qty 15

## 2023-12-25 MED ORDER — AZELASTINE HCL 0.1 % NA SOLN
2.0000 | Freq: Two times a day (BID) | NASAL | Status: DC
  Filled 2023-12-25: qty 30

## 2023-12-25 MED ORDER — ALBUTEROL SULFATE (2.5 MG/3ML) 0.083% IN NEBU: 3.0000 mL | INHALATION_SOLUTION | Freq: Four times a day (QID) | RESPIRATORY_TRACT | Status: DC | PRN

## 2023-12-25 MED ORDER — DIPHENHYDRAMINE HCL 50 MG/ML IJ SOLN: 12.5000 mg | Freq: Four times a day (QID) | INTRAMUSCULAR | Status: DC | PRN

## 2023-12-25 MED ORDER — PROPOFOL 10 MG/ML IV BOLUS
INTRAVENOUS | Status: AC
  Filled 2023-12-25: qty 20

## 2023-12-25 SURGICAL SUPPLY — 20 items
BAG DRAIN URO TABLE W/ADPT NS (BAG) ×1 IMPLANT
BAG URINE DRAIN TURP 4L (OSTOMY) ×1 IMPLANT
CATH FOLEY 3WAY 30CC 22F (CATHETERS) ×1 IMPLANT
CLOTH BEACON ORANGE TIMEOUT ST (SAFETY) ×1 IMPLANT
ELECTRODE REM PT RTRN 9FT ADLT (ELECTROSURGICAL) ×1 IMPLANT
GLOVE BIO SURGEON STRL SZ8 (GLOVE) ×1 IMPLANT
GLOVE BIOGEL PI IND STRL 7.0 (GLOVE) ×2 IMPLANT
GOWN STRL REUS W/TWL LRG LVL3 (GOWN DISPOSABLE) ×1 IMPLANT
GOWN STRL REUS W/TWL XL LVL3 (GOWN DISPOSABLE) ×1 IMPLANT
KIT TURNOVER CYSTO (KITS) ×1 IMPLANT
LOOP CUT BIPOLAR 24F LRG (ELECTROSURGICAL) ×1 IMPLANT
MANIFOLD NEPTUNE II (INSTRUMENTS) ×1 IMPLANT
PACK CYSTO (CUSTOM PROCEDURE TRAY) ×1 IMPLANT
PAD ARMBOARD POSITIONER FOAM (MISCELLANEOUS) ×1 IMPLANT
POSITIONER HEAD 8X9X4 ADT (SOFTGOODS) ×1 IMPLANT
SOL .9 NS 3000ML IRR UROMATIC (IV SOLUTION) ×4 IMPLANT
SYR 30ML LL (SYRINGE) ×1 IMPLANT
SYRINGE TOOMEY IRRIG 70ML (MISCELLANEOUS) ×1 IMPLANT
TOWEL OR 17X26 4PK STRL BLUE (TOWEL DISPOSABLE) ×1 IMPLANT
WATER STERILE IRR 500ML POUR (IV SOLUTION) ×1 IMPLANT

## 2023-12-25 NOTE — Progress Notes (Signed)
 Incentive spirometer teaching completed with patient.  Proper usage of device observed with return demonstration by patient.  Patient able to reach 1000 on spirometer four times.  Placed spirometer with patient belongings.

## 2023-12-25 NOTE — Transfer of Care (Signed)
 Immediate Anesthesia Transfer of Care Note  Patient: Jim Ramirez  Procedure(s) Performed: TURP (TRANSURETHRAL RESECTION OF PROSTATE) (Prostate)  Patient Location: PACU  Anesthesia Type:General  Level of Consciousness: awake  Airway & Oxygen Therapy: Patient Spontanous Breathing and Patient connected to face mask oxygen  Post-op Assessment: Report given to RN and Post -op Vital signs reviewed and stable  Post vital signs: Reviewed and stable  Last Vitals:  Vitals Value Taken Time  BP  120/68  Temp  97.7  Pulse 87 12/25/23 1107  Resp 19 12/25/23 1107  SpO2 96 % 12/25/23 1107  Vitals shown include unfiled device data.  Last Pain:  Vitals:   12/25/23 0856  PainSc: 0-No pain         Complications: No notable events documented.

## 2023-12-25 NOTE — H&P (Signed)
 HPI: Jim Ramirez is a 72yo here for TURP for BPH with Urinary retention. UDS showed bladder capacity 650cc. Max detrusor contraction 27cm of water . He currently has an indwelling foley     PMH:     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    CTA negative for aneurysm   Depressed     Diabetes mellitus without complication (HCC)     DVT (deep venous thrombosis) (HCC)     History of kidney stones     Hypertension     Nephrolithiasis     Sleep apnea            Surgical History:      Past Surgical History:  Procedure Laterality Date   ANTERIOR CERVICAL DECOMP/DISCECTOMY FUSION N/A 05/22/2022    Procedure: ANTERIOR CERVICAL DISCECTOMY FUSION, INTERBODY PROSTHESIS,PLATE/SCREWS CERVICAL THREE-FOUR, CERVICAL FOUR-FIVE;REMOVAL CERVICAL PLATE;  Surgeon: Garry Kansas, MD;  Location: [facility] OR;  Service: Neurosurgery;  Laterality: N/A;  3C   CATARACT EXTRACTION W/PHACO Left 09/23/2022    Procedure: CATARACT EXTRACTION PHACO AND INTRAOCULAR LENS PLACEMENT (IOC);  Surgeon: Tarri Farm, MD;  Location: [facility] ORS;  Service: Ophthalmology;  Laterality: Left;  CDE: 7.52   CATARACT EXTRACTION W/PHACO Right 10/07/2022    Procedure: CATARACT EXTRACTION PHACO AND INTRAOCULAR LENS PLACEMENT (IOC);  Surgeon: Tarri Farm, MD;  Location: [facility] ORS;  Service: Ophthalmology;  Laterality: Right;  CDE: 9.40   IR IVC FILTER PLMT / S&I /IMG GUID/MOD SED   01/19/2023   LUMBAR LAMINECTOMY/DECOMPRESSION MICRODISCECTOMY N/A 01/20/2023    Procedure: LUMBAR LAM FOR EPIDURAL ABSCESS, LUMBAR THREE-FOUR, LUMBAR FOUR-FIVE,  LUMBAR FIVE-SACRAL ONE;  Surgeon: Garry Kansas, MD;  Location: [facility] OR;  Service: Neurosurgery;  Laterality: N/A;   STOMACH SURGERY       TEE WITHOUT CARDIOVERSION N/A 12/31/2022    Procedure: TRANSESOPHAGEAL ECHOCARDIOGRAM;  Surgeon: Wendie Hamburg, MD;  Location: [facility] INVASIVE CV LAB;  Service: Cardiovascular;  Laterality: N/A;    TONSILLECTOMY   2000          Home Medications:  Allergies as of 11/19/2023   No Known Allergies         Medication List           Accurate as of November 19, 2023  1:54 PM. If you have any questions, ask your nurse or doctor.              acetaminophen  325 MG tablet Commonly known as: TYLENOL  Take 2 tablets (650 mg total) by mouth every 6 (six) hours as needed for mild pain (pain score 1-3) (or Fever >/= 101).    albuterol  108 (90 Base) MCG/ACT inhaler Commonly known as: VENTOLIN  HFA Inhale 2 puffs into the lungs every 6 (six) hours as needed for wheezing or shortness of breath.    apixaban  5 MG Tabs tablet Commonly known as: ELIQUIS  Take 1 tablet (5 mg total) by mouth 2 (two) times daily.    ascorbic acid  500 MG tablet Commonly known as: VITAMIN C  Take 500 mg by mouth daily.    Azelastine  HCl 137 MCG/SPRAY Soln Place 2 sprays into both nostrils 2 (two) times daily.    Banophen 25 MG capsule Generic drug: diphenhydrAMINE Take 25 mg by mouth every 6 (six) hours as needed.    BD Veo Insulin  Syringe U/F 31G X 15/64" 0.5 ML Misc Generic drug: Insulin  Syringe-Needle U-100 SMARTSIG:1 Unit(s) 3 Times Daily    betamethasone dipropionate 0.05 % lotion APPLY TO AFFECTED  AREA TWICE A DAY    cefadroxil  500 MG capsule Commonly known as: DURICEF Take 2 capsules (1,000 mg total) by mouth 2 (two) times daily. Continue for long term thru end of May 2025    clotrimazole-betamethasone cream Commonly known as: LOTRISONE Apply 1 Application topically 2 (two) times daily. Apply to sacrum and groin topically for wound care    cyanocobalamin  1000 MCG/ML injection Commonly known as: VITAMIN B12 USE 1 (ONE) MILLILITER ONCE PER MONTH    escitalopram  20 MG tablet Commonly known as: LEXAPRO  Take 20 mg by mouth daily.    esomeprazole 40 MG capsule Commonly known as: NEXIUM Take 40 mg by mouth daily.    ferrous sulfate  325 (65 FE) MG tablet Take 325 mg by mouth 2 (two) times  daily.    hydrochlorothiazide  25 MG tablet Commonly known as: HYDRODIURIL  Take 25 mg by mouth daily.    Jardiance  25 MG Tabs tablet Generic drug: empagliflozin  Take 25 mg by mouth daily.    levocetirizine 5 MG tablet Commonly known as: XYZAL  Take 5 mg by mouth daily.    melatonin 3 MG Tabs tablet Take 3 mg by mouth at bedtime.    metFORMIN  1000 MG tablet Commonly known as: GLUCOPHAGE  Take 1,000 mg by mouth 2 (two) times daily.    metoprolol  succinate 50 MG 24 hr tablet Commonly known as: TOPROL -XL Take 50 mg by mouth daily.    mometasone  0.1 % ointment Commonly known as: ELOCON  Apply topically 2 (two) times daily.    NovoLIN 70/30 (70-30) 100 UNIT/ML injection Generic drug: insulin  NPH-regular Human INJECT 65 - 75 U IN THE MORNING AND 12 U IN THE EVENING, MAX DAILY DOSE: 100 UNIT    OneTouch Delica Plus Lancet33G Misc SMARTSIG:3 Topical Daily    OneTouch Ultra test strip Generic drug: glucose blood 3 (three) times daily.    oxyCODONE  5 MG immediate release tablet Commonly known as: Oxy IR/ROXICODONE  Take 1 tablet (5 mg total) by mouth every 6 (six) hours as needed for moderate pain (pain score 4-6) or severe pain (pain score 7-10).    ProMod Liqd Take 30 mLs by mouth in the morning and at bedtime. For wound healing    rosuvastatin  20 MG tablet Commonly known as: CRESTOR  Take 20 mg by mouth daily.    tamsulosin  0.4 MG Caps capsule Commonly known as: FLOMAX  Take 0.4 mg by mouth daily.    trandolapril  2 MG tablet Commonly known as: MAVIK  Take 2 mg by mouth daily.    Zinc  50 MG Tabs Take 50 mg by mouth daily.    zolpidem  10 MG tablet Commonly known as: AMBIEN  Take 10 mg by mouth at bedtime.             Allergies:  Allergies  No Known Allergies     Family History:      Family History  Problem Relation Age of Onset   Heart attack Mother     Heart attack Father     Hypertension Brother     Diabetes Brother     Coronary artery disease Brother             Social History:  reports that he quit smoking about 31 years ago. His smoking use included cigarettes. He has never been exposed to tobacco smoke. He has never used smokeless tobacco. He reports that he does not currently use alcohol . He reports that he does not use drugs.   ROS: All other review of systems were reviewed and  are negative except what is noted above in HPI   Physical Exam: BP 115/60   Pulse 66   Constitutional:  Alert and oriented, No acute distress. HEENT: [place] AT, moist mucus membranes.  Trachea midline, no masses. Cardiovascular: No clubbing, cyanosis, or edema. Respiratory: Normal respiratory effort, no increased work of breathing. GI: Abdomen is soft, nontender, nondistended, no abdominal masses GU: No CVA tenderness.  Lymph: No cervical or inguinal lymphadenopathy. Skin: No rashes, bruises or suspicious lesions. Neurologic: Grossly intact, no focal deficits, moving all 4 extremities. Psychiatric: Normal mood and affect.   Laboratory Data: Recent Labs       Lab Results  Component Value Date    WBC 5.1 05/29/2023    HGB 8.2 (L) 05/29/2023    HCT 27.5 (L) 05/29/2023    MCV 97.5 05/29/2023    PLT 156 05/29/2023        Recent Labs       Lab Results  Component Value Date    CREATININE 0.52 (L) 05/28/2023        Recent Labs  No results found for: "PSA"     Recent Labs  No results found for: "TESTOSTERONE"     Recent Labs       Lab Results  Component Value Date    HGBA1C 8.5 (H) 12/24/2022        Urinalysis Labs (Brief)          Component Value Date/Time    COLORURINE YELLOW 05/22/2023 1743    APPEARANCEUR CLEAR 05/22/2023 1743    LABSPEC 1.010 05/22/2023 1743    PHURINE 6.0 05/22/2023 1743    GLUCOSEU NEGATIVE 05/22/2023 1743    HGBUR NEGATIVE 05/22/2023 1743    BILIRUBINUR NEGATIVE 05/22/2023 1743    KETONESUR NEGATIVE 05/22/2023 1743    PROTEINUR NEGATIVE 05/22/2023 1743    UROBILINOGEN 2.0 (H) 05/09/2010 0945     NITRITE NEGATIVE 05/22/2023 1743    LEUKOCYTESUR NEGATIVE 05/22/2023 1743        Recent Labs       Lab Results  Component Value Date    BACTERIA RARE (A) 02/22/2023        Pertinent Imaging:   Results for orders placed during the hospital encounter of 11/17/08   DG Abd 1 View   Narrative Clinical Data: Renal calculus, left side pain   ABDOMEN - 1 VIEW   Comparison: 04/28/2006 Correlation:  CT abdomen 11/17/2008   Findings: Small calculus identified in left kidney on preceding CT not radiographically evident. No definite urinary tract calcification visualized. Bones unremarkable. Bowel gas pattern normal.   IMPRESSION: No urinary tract calcifications identified. Please refer to preceding CT abdomen and pelvis of 11/17/2008.   Provider: Federico Hopkins   No results found for this or any previous visit.   No results found for this or any previous visit.   No results found for this or any previous visit.   No results found for this or any previous visit.   No results found for this or any previous visit.   No results found for this or any previous visit.   No results found for this or any previous visit.     Assessment & Plan:     1. Urinary retention (Primary) Schedule for TURP   2. Benign prostatic hyperplasia with urinary retention We discussed the management of his BPH including continued medical therapy, Rezum, Urolift, TURP and simple prostatectomy. After discussing the options the patient has elected to proceed with TURP. Risks/benefits/alternatives discussed.

## 2023-12-25 NOTE — Progress Notes (Signed)
   12/25/23 1532  TOC Brief Assessment  Insurance and Status Reviewed  Patient has primary care physician Yes  Home environment has been reviewed From home  Prior level of function: Independent  Prior/Current Home Services No current home services  Social Drivers of Health Review SDOH reviewed no interventions necessary  Readmission risk has been reviewed Yes  Transition of care needs no transition of care needs at this time   Pt to dc c/Foley with urology follow-up Wednesday, June 4.   Transition of Care Department [facility]) has reviewed patient and no other TOC needs have been identified at this time. We will continue to monitor patient advancement through interdisciplinary progression rounds. If new patient needs arise, please place a TOC consult.

## 2023-12-25 NOTE — Care Management Obs Status (Signed)
 MEDICARE OBSERVATION STATUS NOTIFICATION   Patient Details  Name: TAKEEM KROTZER MRN: [id number] Date of Birth: 06-20-1951   Medicare Observation Status Notification Given:  Yes    Geraldina Klinefelter, RN 12/25/2023, 3:03 PM

## 2023-12-25 NOTE — Op Note (Signed)
 Preoperative diagnosis: BPH with urinary retention  Postoperative diagnosis: BPH  Procedure: 1 cystoscopy 2. Transurethral resection of the prostate  Attending: Johnie Nailer  Anesthesia: General  Estimated blood loss: Minimal  Drains: 22 French foley  Specimens: 1. Prostate Chips  Antibiotics: Rocephin   Findings: Bilobar prostate enlargement. Ureteral orifices in normal anatomic location.   Indications: Patient is a 73 year old male with a history of BPH and urinary retention.  After discussing treatment options, they decided proceed with transurethral resection of the prostate.  Procedure her in detail: The patient was brought to the operating room and a brief timeout was done to ensure correct patient, correct procedure, correct site.  General anesthesia was administered patient was placed in dorsal lithotomy position.  Their genitalia was then prepped and draped in usual sterile fashion.  A rigid 22 French cystoscope was passed in the urethra and the bladder.  Bladder was inspected and we noted no masses or lesions.  the ureteral orifices were in the normal orthotopic locations. removed the cystoscope and placed a resectoscope into the bladder. We then turned our attention to the prostate resection. Using the bipolar resectoscope we started at the 12 oclock position on the left lobe and resection to the 6 o'clock position from the bladder neck to the verumontanum. We then did the same resection of the right lobe. Once the resection was complete we then cauterized individual bleeders. We then removed the prostate chips and sent them for pathology.  We then re-inspected the prostatic fossa and found no residual bleeding.  the bladder was then drained, a 22 French foley was placed and this concluded the procedure which was well tolerated by patient.  Complications: None  Condition: Stable, extubated, transferred to PACU  Plan: Patient is admitted overnight with continuous bladder  irrigation. If their urine is clear tomorrow they will be discharged home and followup in 5 days for foley catheter removal and pathology discussion.

## 2023-12-25 NOTE — Anesthesia Procedure Notes (Signed)
 Procedure Name: LMA Insertion Date/Time: 12/25/2023 10:21 AM  Performed by: Sherwin Donate, CRNAPre-anesthesia Checklist: Patient identified, Emergency Drugs available, Suction available and Patient being monitored Patient Re-evaluated:Patient Re-evaluated prior to induction Oxygen Delivery Method: Circle system utilized Preoxygenation: Pre-oxygenation with 100% oxygen Induction Type: IV induction Ventilation: Mask ventilation without difficulty LMA: LMA with gastric port inserted LMA Size: 4.0 Tube type: Oral Number of attempts: 1 Placement Confirmation: positive ETCO2 Tube secured with: Tape Dental Injury: Teeth and Oropharynx as per pre-operative assessment

## 2023-12-25 NOTE — Anesthesia Preprocedure Evaluation (Signed)
 Anesthesia Evaluation  Patient identified by MRN, date of birth, ID band Patient awake    Reviewed: Allergy & Precautions, H&P , NPO status , Patient's Chart, lab work & pertinent test results, reviewed documented beta blocker date and time   History of Anesthesia Complications (+) history of anesthetic complications  Airway Mallampati: II  TM Distance: >3 FB Neck ROM: full    Dental no notable dental hx.    Pulmonary neg pulmonary ROS, sleep apnea , former smoker   Pulmonary exam normal breath sounds clear to auscultation       Cardiovascular Exercise Tolerance: Good hypertension, negative cardio ROS  Rhythm:regular Rate:Normal     Neuro/Psych  PSYCHIATRIC DISORDERS  Depression     Neuromuscular disease negative neurological ROS  negative psych ROS   GI/Hepatic negative GI ROS, Neg liver ROS, PUD,,,  Endo/Other  negative endocrine ROSdiabetes    Renal/GU Renal diseasenegative Renal ROS  negative genitourinary   Musculoskeletal   Abdominal   Peds  Hematology negative hematology ROS (+)   Anesthesia Other Findings   Reproductive/Obstetrics negative OB ROS                             Anesthesia Physical Anesthesia Plan  ASA: 3  Anesthesia Plan: General and General ETT   Post-op Pain Management:    Induction:   PONV Risk Score and Plan: Ondansetron   Airway Management Planned:   Additional Equipment:   Intra-op Plan:   Post-operative Plan:   Informed Consent: I have reviewed the patients History and Physical, chart, labs and discussed the procedure including the risks, benefits and alternatives for the proposed anesthesia with the patient or authorized representative who has indicated his/her understanding and acceptance.     Dental Advisory Given  Plan Discussed with: CRNA  Anesthesia Plan Comments:        Anesthesia Quick Evaluation

## 2023-12-26 ENCOUNTER — Encounter (HOSPITAL_COMMUNITY): Payer: Self-pay | Admitting: Urology

## 2023-12-26 DIAGNOSIS — C61 Malignant neoplasm of prostate: Secondary | ICD-10-CM | POA: Diagnosis not present

## 2023-12-26 LAB — GLUCOSE, CAPILLARY
Glucose-Capillary: 151 mg/dL — ABNORMAL HIGH (ref 70–99)
Glucose-Capillary: 197 mg/dL — ABNORMAL HIGH (ref 70–99)

## 2023-12-26 LAB — CBC
HCT: 35.1 % — ABNORMAL LOW (ref 39.0–52.0)
Hemoglobin: 11.6 g/dL — ABNORMAL LOW (ref 13.0–17.0)
MCH: 29.8 pg (ref 26.0–34.0)
MCHC: 33 g/dL (ref 30.0–36.0)
MCV: 90.2 fL (ref 80.0–100.0)
Platelets: 115 10*3/uL — ABNORMAL LOW (ref 150–400)
RBC: 3.89 MIL/uL — ABNORMAL LOW (ref 4.22–5.81)
RDW: 14.1 % (ref 11.5–15.5)
WBC: 8.5 10*3/uL (ref 4.0–10.5)
nRBC: 0 % (ref 0.0–0.2)

## 2023-12-26 LAB — BASIC METABOLIC PANEL WITH GFR
Anion gap: 5 (ref 5–15)
BUN: 15 mg/dL (ref 8–23)
CO2: 27 mmol/L (ref 22–32)
Calcium: 8.5 mg/dL — ABNORMAL LOW (ref 8.9–10.3)
Chloride: 104 mmol/L (ref 98–111)
Creatinine, Ser: 0.66 mg/dL (ref 0.61–1.24)
GFR, Estimated: >60 mL/min (ref >60)
Glucose, Bld: 147 mg/dL — ABNORMAL HIGH (ref 70–99)
Potassium: 3.6 mmol/L (ref 3.5–5.1)
Sodium: 136 mmol/L (ref 135–145)

## 2023-12-26 MED ORDER — TRAMADOL HCL 50 MG PO TABS: 50.0000 mg | ORAL_TABLET | Freq: Four times a day (QID) | ORAL | 0 refills | Status: AC | PRN

## 2023-12-26 NOTE — Plan of Care (Signed)
   Problem: Education: Goal: Knowledge of General Education information will improve Description: Including pain rating scale, medication(s)/side effects and non-pharmacologic comfort measures Outcome: Progressing   Problem: Health Behavior/Discharge Planning: Goal: Ability to manage health-related needs will improve Outcome: Progressing   Problem: Clinical Measurements: Goal: Will remain free from infection Outcome: Progressing

## 2023-12-26 NOTE — Progress Notes (Signed)
 Mobility Specialist Progress Note:    12/26/23 1005  Mobility  Activity Ambulated with assistance in hallway  Level of Assistance Modified independent, requires aide device or extra time  Assistive Device Front wheel walker  Distance Ambulated (ft) 400 ft  Range of Motion/Exercises Active;All extremities  Activity Response Tolerated well  Mobility Referral Yes  Mobility visit 1 Mobility  Mobility Specialist Start Time (ACUTE ONLY) 1005  Mobility Specialist Stop Time (ACUTE ONLY) 1027  Mobility Specialist Time Calculation (min) (ACUTE ONLY) 22 min   Pt received in chair, agreeable to mobility. ModI to stand and ambulate with RW. Tolerated well,asx throughout. Returned pt to chair, call bell in reach. All needs met.  Jim Ramirez Mobility Specialist Please contact via Special educational needs teacher or  Rehab office at [phone]

## 2023-12-28 DIAGNOSIS — J984 Other disorders of lung: Secondary | ICD-10-CM | POA: Diagnosis not present

## 2023-12-28 DIAGNOSIS — E119 Type 2 diabetes mellitus without complications: Secondary | ICD-10-CM | POA: Diagnosis not present

## 2023-12-28 DIAGNOSIS — M6281 Muscle weakness (generalized): Secondary | ICD-10-CM | POA: Diagnosis not present

## 2023-12-28 DIAGNOSIS — I509 Heart failure, unspecified: Secondary | ICD-10-CM | POA: Diagnosis not present

## 2023-12-28 DIAGNOSIS — R600 Localized edema: Secondary | ICD-10-CM | POA: Diagnosis not present

## 2023-12-28 DIAGNOSIS — E785 Hyperlipidemia, unspecified: Secondary | ICD-10-CM | POA: Diagnosis not present

## 2023-12-28 DIAGNOSIS — M4626 Osteomyelitis of vertebra, lumbar region: Secondary | ICD-10-CM | POA: Diagnosis not present

## 2023-12-28 NOTE — Anesthesia Postprocedure Evaluation (Signed)
 Anesthesia Post Note  Patient: Jim Ramirez  Procedure(s) Performed: TURP (TRANSURETHRAL RESECTION OF PROSTATE) (Prostate)  Patient location during evaluation: Phase II Anesthesia Type: General Level of consciousness: awake Pain management: pain level controlled Vital Signs Assessment: post-procedure vital signs reviewed and stable Respiratory status: spontaneous breathing and respiratory function stable Cardiovascular status: blood pressure returned to baseline and stable Postop Assessment: no headache and no apparent nausea or vomiting Anesthetic complications: no Comments: Late entry   No notable events documented.   Last Vitals:  Vitals:   12/25/23 1307 12/25/23 1757  BP: 115/60 (!) 102/53  Pulse: 73 75  Resp: 16 17  Temp: 36.4 C 36.6 C  SpO2: 98% 96%    Last Pain:  Vitals:   12/25/23 2345  TempSrc:   PainSc: Asleep                 Coretha Dew

## 2023-12-29 LAB — SURGICAL PATHOLOGY

## 2023-12-30 DIAGNOSIS — R9082 White matter disease, unspecified: Secondary | ICD-10-CM | POA: Diagnosis not present

## 2023-12-30 DIAGNOSIS — H538 Other visual disturbances: Secondary | ICD-10-CM | POA: Diagnosis not present

## 2023-12-30 NOTE — Discharge Summary (Signed)
 Physician Discharge Summary  Patient ID: Jim Ramirez MRN: [id number] DOB/AGE: 12/16/1950 73 y.o.  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 male who was admitted 12/25/2023 with a diagnosis of BPH (benign prostatic hyperplasia) and went to the operating room on 12/25/2023 and underwent the above named procedures.    He was given perioperative antibiotics:  Anti-infectives (From admission, onward)    Start     Dose/Rate Route Frequency Ordered Stop   12/25/23 1500  cefadroxil  (DURICEF) capsule 500 mg  Status:  Discontinued        500 mg Oral 2 times daily 12/25/23 1307 12/26/23 1753   12/25/23 0915  cefTRIAXone  (ROCEPHIN ) 2 g in sodium chloride  0.9 % 100 mL IVPB        2 g 200 mL/hr over 30 Minutes Intravenous 30 min pre-op 12/25/23 0808 12/25/23 1031     .  He was given sequential compression devices, early ambulation for DVT prophylaxis.  He benefited maximally from the hospital stay and there were no complications.    Recent vital signs:  Vitals:   12/25/23 1307 12/25/23 1757  BP: 115/60 (!) 102/53  Pulse: 73 75  Resp: 16 17  Temp: 97.6 F (36.4 C) 97.8 F (36.6 C)  SpO2: 98% 96%    Recent laboratory studies:  Lab Results   Component Value Date   HGB 11.6 (L) 12/26/2023   HGB 11.9 (L) 12/25/2023   HGB 13.1 (L) 11/27/2023   Lab Results  Component Value Date   WBC 8.5 12/26/2023   PLT 115 (L) 12/26/2023   No results found for: "INR" Lab Results  Component Value Date   NA 136 12/26/2023   K 3.6 12/26/2023   CL 104 12/26/2023   CO2 27 12/26/2023   BUN 15 12/26/2023   CREATININE 0.66 12/26/2023   GLUCOSE 147 (H) 12/26/2023    Discharge Medications:   Allergies as of 12/26/2023   No Known Allergies      Medication List     TAKE these medications    acetaminophen  325 MG tablet Commonly known as: TYLENOL  Take 2 tablets (650 mg total) by mouth every 6 (six) hours as needed for mild pain (pain score 1-3) (or Fever >/= 101).   albuterol  108 (90 Base) MCG/ACT inhaler Commonly known as: VENTOLIN  HFA Inhale 2 puffs into the lungs every 6 (six) hours as needed for wheezing or shortness of breath.   apixaban  5 MG Tabs tablet Commonly known as: ELIQUIS  Take 1 tablet (5 mg total) by mouth 2 (two) times daily.   ascorbic acid  500 MG tablet Commonly known as: VITAMIN C  Take 500 mg by mouth daily.   Azelastine  HCl 137 MCG/SPRAY Soln Place 2 sprays into both nostrils 2 (two) times  daily.   BD Veo Insulin  Syringe U/F 31G X 15/64" 0.5 ML Misc Generic drug: Insulin  Syringe-Needle U-100 SMARTSIG:1 Unit(s) 3 Times Daily   betamethasone dipropionate 0.05 % lotion Apply 1 Application topically 2 (two) times daily as needed (irritation).   cefadroxil  500 MG capsule Commonly known as: DURICEF Take 1 capsule (500 mg total) by mouth 2 (two) times daily.   clotrimazole-betamethasone cream Commonly known as: LOTRISONE Apply 1 Application topically 2 (two) times daily as needed (irritation). Apply to sacrum and groin topically for wound care   cyanocobalamin  1000 MCG/ML injection Commonly known as: VITAMIN B12 Inject 1,000 mcg into the muscle every 30 (thirty) days.   diphenhydrAMINE  25 MG  tablet Commonly known as: BENADRYL  Take 25 mg by mouth every 6 (six) hours as needed for allergies.   escitalopram  20 MG tablet Commonly known as: LEXAPRO  Take 20 mg by mouth daily.   esomeprazole 40 MG capsule Commonly known as: NEXIUM Take 40 mg by mouth daily.   ferrous sulfate  325 (65 FE) MG tablet Take 325 mg by mouth 2 (two) times daily.   hydrochlorothiazide  25 MG tablet Commonly known as: HYDRODIURIL  Take 25 mg by mouth daily.   Jardiance  25 MG Tabs tablet Generic drug: empagliflozin  Take 25 mg by mouth daily.   levocetirizine 5 MG tablet Commonly known as: XYZAL  Take 5 mg by mouth daily.   metFORMIN  1000 MG tablet Commonly known as: GLUCOPHAGE  Take 1,000 mg by mouth 2 (two) times daily.   metoprolol  succinate 50 MG 24 hr tablet Commonly known as: TOPROL -XL Take 50 mg by mouth daily.   mometasone  0.1 % ointment Commonly known as: ELOCON  Apply 1 application  topically 2 (two) times daily as needed (irritation).   NovoLIN 70/30 (70-30) 100 UNIT/ML injection Generic drug: insulin  NPH-regular Human Inject 12-75 Units into the skin 2 (two) times daily as needed (High Blood Sugar). Max dose 100 units daily   OneTouch Delica Plus Lancet33G Misc SMARTSIG:3 Topical Daily   OneTouch Ultra test strip Generic drug: glucose blood 3 (three) times daily.   oxyCODONE  5 MG immediate release tablet Commonly known as: Oxy IR/ROXICODONE  Take 1 tablet (5 mg total) by mouth every 6 (six) hours as needed for moderate pain (pain score 4-6) or severe pain (pain score 7-10).   rosuvastatin  20 MG tablet Commonly known as: CRESTOR  Take 20 mg by mouth daily.   tamsulosin  0.4 MG Caps capsule Commonly known as: FLOMAX  Take 0.4 mg by mouth daily.   traMADol  50 MG tablet Commonly known as: Ultram  Take 1 tablet (50 mg total) by mouth every 6 (six) hours as needed.   trandolapril  2 MG tablet Commonly known as: MAVIK  Take 2 mg by mouth daily.   zolpidem  10 MG  tablet Commonly known as: AMBIEN  Take 10 mg by mouth at bedtime as needed for sleep.        Diagnostic Studies: No results found.  Disposition: Discharge disposition: 01-Home or Self Care       Discharge Instructions     Discharge patient   Complete by: As directed    Discharge disposition: 01-Home or Self Care   Discharge patient date: 12/26/2023   Discharge patient   Complete by: As directed    Discharge disposition: 01-Home or Self Care   Discharge patient date: 12/26/2023        Follow-up Information     Marco Severs, MD. Call in 1 week(s).   Specialty: Urology Contact information: [address]  [address] [place]  [place] [address] [phone]                  Signed: Johnie Nailer 12/30/2023, 7:52 AM

## 2023-12-31 ENCOUNTER — Encounter: Payer: Self-pay | Admitting: Urology

## 2023-12-31 ENCOUNTER — Ambulatory Visit (INDEPENDENT_AMBULATORY_CARE_PROVIDER_SITE_OTHER): Admitting: Urology

## 2023-12-31 VITALS — BP 123/69 | HR 67

## 2023-12-31 DIAGNOSIS — N401 Enlarged prostate with lower urinary tract symptoms: Secondary | ICD-10-CM

## 2023-12-31 DIAGNOSIS — C61 Malignant neoplasm of prostate: Secondary | ICD-10-CM

## 2023-12-31 DIAGNOSIS — R338 Other retention of urine: Secondary | ICD-10-CM

## 2023-12-31 MED ORDER — CIPROFLOXACIN HCL 500 MG PO TABS
500.0000 mg | ORAL_TABLET | Freq: Once | ORAL | Status: AC
Start: 2023-12-31 — End: 2023-12-31
  Administered 2023-12-31: 500 mg via ORAL

## 2023-12-31 NOTE — Patient Instructions (Signed)

## 2023-12-31 NOTE — Progress Notes (Signed)
 12/31/2023 11:32 AM   Jim Ramirez 02/01/1951 [id number]  Referring provider: Theoplis Fix, MD [address] [place],  [place] [address]  Followup after TURP   HPI: Jim Ramirez is a 73yo here for followup after TURP. Pathology <5% Gleason 3+3=6. Voiding trial passed today.    PMH: 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   CTA negative for aneurysm   Atrial fibrillation (HCC)    Complication of anesthesia    spinal headaches   Depressed    Diabetes mellitus without complication (HCC)    DVT (deep venous thrombosis) (HCC)    History of kidney stones    Hypertension    Nephrolithiasis    Sleep apnea     Surgical History: Past Surgical History:  Procedure Laterality Date   ANTERIOR CERVICAL DECOMP/DISCECTOMY FUSION N/A 05/22/2022   Procedure: ANTERIOR CERVICAL DISCECTOMY FUSION, INTERBODY PROSTHESIS,PLATE/SCREWS CERVICAL THREE-FOUR, CERVICAL FOUR-FIVE;REMOVAL CERVICAL PLATE;  Surgeon: Garry Kansas, MD;  Location: [facility] OR;  Service: Neurosurgery;  Laterality: N/A;  3C   CATARACT EXTRACTION W/PHACO Left 09/23/2022   Procedure: CATARACT EXTRACTION PHACO AND INTRAOCULAR LENS PLACEMENT (IOC);  Surgeon: Tarri Farm, MD;  Location: [facility];  Service: Ophthalmology;  Laterality: Left;  CDE: 7.52   CATARACT EXTRACTION W/PHACO Right 10/07/2022   Procedure: CATARACT EXTRACTION PHACO AND INTRAOCULAR LENS PLACEMENT (IOC);  Surgeon: Tarri Farm, MD;  Location: [facility];  Service: Ophthalmology;  Laterality: Right;  CDE: 9.40   IR IVC FILTER PLMT / S&I /IMG GUID/MOD SED  01/19/2023   LUMBAR LAMINECTOMY/DECOMPRESSION MICRODISCECTOMY N/A 01/20/2023   Procedure: LUMBAR LAM FOR EPIDURAL ABSCESS, LUMBAR THREE-FOUR, LUMBAR FOUR-FIVE,  LUMBAR FIVE-SACRAL ONE;  Surgeon: Garry Kansas, MD;  Location: [facility] OR;  Service: Neurosurgery;  Laterality: N/A;   STOMACH SURGERY     TEE WITHOUT CARDIOVERSION N/A 12/31/2022   Procedure:  TRANSESOPHAGEAL ECHOCARDIOGRAM;  Surgeon: Wendie Hamburg, MD;  Location: [facility] INVASIVE CV LAB;  Service: Cardiovascular;  Laterality: N/A;   TONSILLECTOMY  2000   TRANSURETHRAL RESECTION OF PROSTATE N/A 12/25/2023   Procedure: TURP (TRANSURETHRAL RESECTION OF PROSTATE);  Surgeon: Marco Severs, MD;  Location: [facility];  Service: Urology;  Laterality: N/A;    Home Medications:  Allergies as of 12/31/2023   No Known Allergies      Medication List        Accurate as of December 31, 2023 11:32 AM. If you have any questions, ask your nurse or doctor.          acetaminophen  325 MG tablet Commonly known as: TYLENOL  Take 2 tablets (650 mg total) by mouth every 6 (six) hours as needed for mild pain (pain score 1-3) (or Fever >/= 101).   albuterol  108 (90 Base) MCG/ACT inhaler Commonly known as: VENTOLIN  HFA Inhale 2 puffs into the lungs every 6 (six) hours as needed for wheezing or shortness of breath.   apixaban  5 MG Tabs tablet Commonly known as: ELIQUIS  Take 1 tablet (5 mg total) by mouth 2 (two) times daily.   ascorbic acid  500 MG tablet Commonly known as: VITAMIN C  Take 500 mg by mouth daily.   Azelastine  HCl 137 MCG/SPRAY Soln Place 2 sprays into both nostrils 2 (two) times daily.   BD Veo Insulin  Syringe U/F 31G X 15/64" 0.5 ML Misc Generic drug: Insulin  Syringe-Needle U-100 SMARTSIG:1 Unit(s) 3 Times Daily   betamethasone dipropionate 0.05 % lotion Apply 1 Application topically 2 (two) times daily as needed (irritation).   cefadroxil   500 MG capsule Commonly known as: DURICEF Take 1 capsule (500 mg total) by mouth 2 (two) times daily.   clotrimazole-betamethasone cream Commonly known as: LOTRISONE Apply 1 Application topically 2 (two) times daily as needed (irritation). Apply to sacrum and groin topically for wound care   cyanocobalamin  1000 MCG/ML injection Commonly known as: VITAMIN B12 Inject 1,000 mcg into the muscle every 30 (thirty) days.    diphenhydrAMINE  25 MG tablet Commonly known as: BENADRYL  Take 25 mg by mouth every 6 (six) hours as needed for allergies.   escitalopram  20 MG tablet Commonly known as: LEXAPRO  Take 20 mg by mouth daily.   esomeprazole 40 MG capsule Commonly known as: NEXIUM Take 40 mg by mouth daily.   ferrous sulfate  325 (65 FE) MG tablet Take 325 mg by mouth 2 (two) times daily.   hydrochlorothiazide  25 MG tablet Commonly known as: HYDRODIURIL  Take 25 mg by mouth daily.   Jardiance  25 MG Tabs tablet Generic drug: empagliflozin  Take 25 mg by mouth daily.   levocetirizine 5 MG tablet Commonly known as: XYZAL  Take 5 mg by mouth daily.   metFORMIN  1000 MG tablet Commonly known as: GLUCOPHAGE  Take 1,000 mg by mouth 2 (two) times daily.   metoprolol  succinate 50 MG 24 hr tablet Commonly known as: TOPROL -XL Take 50 mg by mouth daily.   mometasone  0.1 % ointment Commonly known as: ELOCON  Apply 1 application  topically 2 (two) times daily as needed (irritation).   NovoLIN 70/30 (70-30) 100 UNIT/ML injection Generic drug: insulin  NPH-regular Human Inject 12-75 Units into the skin 2 (two) times daily as needed (High Blood Sugar). Max dose 100 units daily   OneTouch Delica Plus Lancet33G Misc SMARTSIG:3 Topical Daily   OneTouch Ultra test strip Generic drug: glucose blood 3 (three) times daily.   oxyCODONE  5 MG immediate release tablet Commonly known as: Oxy IR/ROXICODONE  Take 1 tablet (5 mg total) by mouth every 6 (six) hours as needed for moderate pain (pain score 4-6) or severe pain (pain score 7-10).   rosuvastatin  20 MG tablet Commonly known as: CRESTOR  Take 20 mg by mouth daily.   tamsulosin  0.4 MG Caps capsule Commonly known as: FLOMAX  Take 0.4 mg by mouth daily.   traMADol  50 MG tablet Commonly known as: Ultram  Take 1 tablet (50 mg total) by mouth every 6 (six) hours as needed.   trandolapril  2 MG tablet Commonly known as: MAVIK  Take 2 mg by mouth daily.    zolpidem  10 MG tablet Commonly known as: AMBIEN  Take 10 mg by mouth at bedtime as needed for sleep.        Allergies: No Known Allergies  Family History: Family History  Problem Relation Age of Onset   Heart attack Mother    Heart attack Father    Hypertension Brother    Diabetes Brother    Coronary artery disease Brother     Social History:  reports that he quit smoking about 31 years ago. His smoking use included cigarettes. He has never been exposed to tobacco smoke. He has never used smokeless tobacco. He reports that he does not currently use alcohol . He reports that he does not use drugs.  ROS: All other review of systems were reviewed and are negative except what is noted above in HPI  Physical Exam: BP 123/69   Pulse 67   Constitutional:  Alert and oriented, No acute distress. HEENT: [place] AT, moist mucus membranes.  Trachea midline, no masses. Cardiovascular: No clubbing, cyanosis, or edema. Respiratory: Normal  respiratory effort, no increased work of breathing. GI: Abdomen is soft, nontender, nondistended, no abdominal masses GU: No CVA tenderness.  Lymph: No cervical or inguinal lymphadenopathy. Skin: No rashes, bruises or suspicious lesions. Neurologic: Grossly intact, no focal deficits, moving all 4 extremities. Psychiatric: Normal mood and affect.  Laboratory Data: Lab Results  Component Value Date   WBC 8.5 12/26/2023   HGB 11.6 (L) 12/26/2023   HCT 35.1 (L) 12/26/2023   MCV 90.2 12/26/2023   PLT 115 (L) 12/26/2023    Lab Results  Component Value Date   CREATININE 0.66 12/26/2023    No results found for: "PSA"  No results found for: "TESTOSTERONE"  Lab Results  Component Value Date   HGBA1C 8.8 (H) 12/25/2023    Urinalysis    Component Value Date/Time   COLORURINE YELLOW 11/24/2023 1115   APPEARANCEUR CLEAR 11/24/2023 1115   LABSPEC 1.025 11/24/2023 1115   PHURINE 5.0 11/24/2023 1115   GLUCOSEU >=500 (A) 11/24/2023 1115   HGBUR  NEGATIVE 11/24/2023 1115   BILIRUBINUR NEGATIVE 11/24/2023 1115   KETONESUR NEGATIVE 11/24/2023 1115   PROTEINUR NEGATIVE 11/24/2023 1115   UROBILINOGEN 2.0 (H) 05/09/2010 0945   NITRITE NEGATIVE 11/24/2023 1115   LEUKOCYTESUR TRACE (A) 11/24/2023 1115    Lab Results  Component Value Date   BACTERIA NONE SEEN 11/24/2023    Pertinent Imaging:  Results for orders placed during the hospital encounter of 11/17/08  DG Abd 1 View  Narrative Clinical Data: Renal calculus, left side pain  ABDOMEN - 1 VIEW  Comparison: 04/28/2006 Correlation:  CT abdomen 11/17/2008  Findings: Small calculus identified in left kidney on preceding CT not radiographically evident. No definite urinary tract calcification visualized. Bones unremarkable. Bowel gas pattern normal.  IMPRESSION: No urinary tract calcifications identified. Please refer to preceding CT abdomen and pelvis of 11/17/2008.  Provider: Federico Hopkins  No results found for this or any previous visit.  No results found for this or any previous visit.  No results found for this or any previous visit.  No results found for this or any previous visit.  No results found for this or any previous visit.  No results found for this or any previous visit.  No results found for this or any previous visit.   Assessment & Plan:    1. Benign prostatic hyperplasia with urinary retention (Primary) -followup 2-3 weeks with a PVR - Bladder Voiding Trial - ciprofloxacin  (CIPRO ) tablet 500 mg  2. Prostate cancer I discussed the natural history of low risk prostate cancer with the patient and the various treatment options including active surveillance, RALP, IMRT, brachytherapy, cryotherapy, HIFU and ADT. After discussing the options the patient elects for surveillance. I will see him back in 3 months with a PSA. After we reviewed the treatment options for prostate cancer which include but are not limited to surgery, radiation,  cryo, HIFU, hormones and minimally invasive options [laser, focal therapy etc], we spoke of active surveillance (AS) as a management strategy for prostate cancer. I pointed out the differences between AS and watchful waiting [facility]) whereby the former approach utilizes periodic reassessment with the possibility of later entering active treatment compared to the latter which does not assess for cancer progression but will initiate palliative therapy if the patient becomes symptomatic. We reviewed some of the symptoms of advanced disease including weight loss, bone pain, worsened LUTS etc. I told the patient that by electing AS or no treatment, and because this is prostate cancer, there is always the  chance for cancer progression and loss of the window of curability. This is a risk that the patient must be willing to accept. Active surveillance has not been prospectively validated in clinical trials in intermediate-risk disease, but it may be considered in favorable intermediate-risk disease. However, for men with intermediate risk disease, active surveillance comes with a higher risk of developing metastases compared to definitive treatment. Other drawbacks of AS include the need for continual monitoring with PSA, clinic visits, occasional prostate biopsies and their attendant risks/costs and possibly imaging studies. For some patients on a surveillance strategy, there may be a psychological burden to obviating active treatment of prostate cancer.  We outlined various methods to monitor patients on AS, which at a minimum includes every 6-12 month PSAs, DREs and visits. Initially I recommend a follow up biopsy within 1 year, but as the years progress and should the patient not be found to have more aggressive disease, the interval between biopsies may be extended. The three more common reasons to enter into an active treatment protocol are (1) a greater volume of cancer detected, (2) higher grade cancer, (3) patient  concern or preference. Finally I told the patient that even though we might think the patient is a candidate for AS, understaging and undergrading does occur, and knowledge of whether the patient is actually a good candidate is incomplete. I could not make any guarantee that the patient was making the right decision or that his cancer may later be cured if the patient eventually opted for treatment. The patient expressed understanding of these issues.     No follow-ups on file.  Johnie Nailer, MD  [facility] [place]

## 2023-12-31 NOTE — Progress Notes (Signed)
 Fill and Pull Catheter Removal  Patient is present today for a catheter removal.  of sterile water  was instilled into the bladder when the patient felt the urge to urinate. 30ml of water  was then drained from the balloon.  A 22FR foley cath was removed from the bladder no complications were noted .  Foley catheter intact and time of removal. Patient as then given some time to void on their own.  Patient can void  on their own after some time.  Patient tolerated well.  One oral prophylactic antibiotic given per MD orders  Performed by: Gillian Lacrosse T. CMA  Follow up/ Additional notes: f/u as scheduled

## 2024-01-02 ENCOUNTER — Ambulatory Visit

## 2024-01-12 DIAGNOSIS — H43821 Vitreomacular adhesion, right eye: Secondary | ICD-10-CM | POA: Diagnosis not present

## 2024-01-12 DIAGNOSIS — H31092 Other chorioretinal scars, left eye: Secondary | ICD-10-CM | POA: Diagnosis not present

## 2024-01-12 DIAGNOSIS — H35372 Puckering of macula, left eye: Secondary | ICD-10-CM | POA: Diagnosis not present

## 2024-01-12 DIAGNOSIS — Z961 Presence of intraocular lens: Secondary | ICD-10-CM | POA: Diagnosis not present

## 2024-01-12 DIAGNOSIS — H35033 Hypertensive retinopathy, bilateral: Secondary | ICD-10-CM | POA: Diagnosis not present

## 2024-01-12 DIAGNOSIS — E113313 Type 2 diabetes mellitus with moderate nonproliferative diabetic retinopathy with macular edema, bilateral: Secondary | ICD-10-CM | POA: Diagnosis not present

## 2024-01-12 DIAGNOSIS — H43812 Vitreous degeneration, left eye: Secondary | ICD-10-CM | POA: Diagnosis not present

## 2024-01-19 DIAGNOSIS — E1142 Type 2 diabetes mellitus with diabetic polyneuropathy: Secondary | ICD-10-CM | POA: Diagnosis not present

## 2024-01-19 DIAGNOSIS — Z7984 Long term (current) use of oral hypoglycemic drugs: Secondary | ICD-10-CM | POA: Diagnosis not present

## 2024-01-19 DIAGNOSIS — F1120 Opioid dependence, uncomplicated: Secondary | ICD-10-CM | POA: Diagnosis not present

## 2024-01-19 DIAGNOSIS — I10 Essential (primary) hypertension: Secondary | ICD-10-CM | POA: Diagnosis not present

## 2024-01-19 DIAGNOSIS — I480 Paroxysmal atrial fibrillation: Secondary | ICD-10-CM | POA: Diagnosis not present

## 2024-01-19 DIAGNOSIS — Z9181 History of falling: Secondary | ICD-10-CM | POA: Diagnosis not present

## 2024-01-19 DIAGNOSIS — Z87891 Personal history of nicotine dependence: Secondary | ICD-10-CM | POA: Diagnosis not present

## 2024-01-19 DIAGNOSIS — Z7901 Long term (current) use of anticoagulants: Secondary | ICD-10-CM | POA: Diagnosis not present

## 2024-01-19 DIAGNOSIS — E278 Other specified disorders of adrenal gland: Secondary | ICD-10-CM | POA: Diagnosis not present

## 2024-01-19 DIAGNOSIS — N400 Enlarged prostate without lower urinary tract symptoms: Secondary | ICD-10-CM | POA: Diagnosis not present

## 2024-01-23 DIAGNOSIS — E1142 Type 2 diabetes mellitus with diabetic polyneuropathy: Secondary | ICD-10-CM | POA: Diagnosis not present

## 2024-01-23 DIAGNOSIS — Z9181 History of falling: Secondary | ICD-10-CM | POA: Diagnosis not present

## 2024-01-23 DIAGNOSIS — Z87891 Personal history of nicotine dependence: Secondary | ICD-10-CM | POA: Diagnosis not present

## 2024-01-23 DIAGNOSIS — I480 Paroxysmal atrial fibrillation: Secondary | ICD-10-CM | POA: Diagnosis not present

## 2024-01-23 DIAGNOSIS — I10 Essential (primary) hypertension: Secondary | ICD-10-CM | POA: Diagnosis not present

## 2024-01-23 DIAGNOSIS — N400 Enlarged prostate without lower urinary tract symptoms: Secondary | ICD-10-CM | POA: Diagnosis not present

## 2024-01-23 DIAGNOSIS — Z7984 Long term (current) use of oral hypoglycemic drugs: Secondary | ICD-10-CM | POA: Diagnosis not present

## 2024-01-23 DIAGNOSIS — F1120 Opioid dependence, uncomplicated: Secondary | ICD-10-CM | POA: Diagnosis not present

## 2024-01-23 DIAGNOSIS — E278 Other specified disorders of adrenal gland: Secondary | ICD-10-CM | POA: Diagnosis not present

## 2024-01-23 DIAGNOSIS — Z7901 Long term (current) use of anticoagulants: Secondary | ICD-10-CM | POA: Diagnosis not present

## 2024-01-26 DIAGNOSIS — E1165 Type 2 diabetes mellitus with hyperglycemia: Secondary | ICD-10-CM | POA: Diagnosis not present

## 2024-01-27 DIAGNOSIS — Z7901 Long term (current) use of anticoagulants: Secondary | ICD-10-CM | POA: Diagnosis not present

## 2024-01-27 DIAGNOSIS — E119 Type 2 diabetes mellitus without complications: Secondary | ICD-10-CM | POA: Diagnosis not present

## 2024-01-27 DIAGNOSIS — F1120 Opioid dependence, uncomplicated: Secondary | ICD-10-CM | POA: Diagnosis not present

## 2024-01-27 DIAGNOSIS — Z9181 History of falling: Secondary | ICD-10-CM | POA: Diagnosis not present

## 2024-01-27 DIAGNOSIS — E1142 Type 2 diabetes mellitus with diabetic polyneuropathy: Secondary | ICD-10-CM | POA: Diagnosis not present

## 2024-01-27 DIAGNOSIS — I10 Essential (primary) hypertension: Secondary | ICD-10-CM | POA: Diagnosis not present

## 2024-01-27 DIAGNOSIS — M6281 Muscle weakness (generalized): Secondary | ICD-10-CM | POA: Diagnosis not present

## 2024-01-27 DIAGNOSIS — I480 Paroxysmal atrial fibrillation: Secondary | ICD-10-CM | POA: Diagnosis not present

## 2024-01-27 DIAGNOSIS — E278 Other specified disorders of adrenal gland: Secondary | ICD-10-CM | POA: Diagnosis not present

## 2024-01-27 DIAGNOSIS — E785 Hyperlipidemia, unspecified: Secondary | ICD-10-CM | POA: Diagnosis not present

## 2024-01-27 DIAGNOSIS — I509 Heart failure, unspecified: Secondary | ICD-10-CM | POA: Diagnosis not present

## 2024-01-27 DIAGNOSIS — N400 Enlarged prostate without lower urinary tract symptoms: Secondary | ICD-10-CM | POA: Diagnosis not present

## 2024-01-27 DIAGNOSIS — R600 Localized edema: Secondary | ICD-10-CM | POA: Diagnosis not present

## 2024-01-27 DIAGNOSIS — Z87891 Personal history of nicotine dependence: Secondary | ICD-10-CM | POA: Diagnosis not present

## 2024-01-27 DIAGNOSIS — J984 Other disorders of lung: Secondary | ICD-10-CM | POA: Diagnosis not present

## 2024-01-27 DIAGNOSIS — M4626 Osteomyelitis of vertebra, lumbar region: Secondary | ICD-10-CM | POA: Diagnosis not present

## 2024-01-27 DIAGNOSIS — Z7984 Long term (current) use of oral hypoglycemic drugs: Secondary | ICD-10-CM | POA: Diagnosis not present

## 2024-02-02 DIAGNOSIS — Z87891 Personal history of nicotine dependence: Secondary | ICD-10-CM | POA: Diagnosis not present

## 2024-02-02 DIAGNOSIS — F1120 Opioid dependence, uncomplicated: Secondary | ICD-10-CM | POA: Diagnosis not present

## 2024-02-02 DIAGNOSIS — Z9181 History of falling: Secondary | ICD-10-CM | POA: Diagnosis not present

## 2024-02-02 DIAGNOSIS — I480 Paroxysmal atrial fibrillation: Secondary | ICD-10-CM | POA: Diagnosis not present

## 2024-02-02 DIAGNOSIS — E278 Other specified disorders of adrenal gland: Secondary | ICD-10-CM | POA: Diagnosis not present

## 2024-02-02 DIAGNOSIS — N400 Enlarged prostate without lower urinary tract symptoms: Secondary | ICD-10-CM | POA: Diagnosis not present

## 2024-02-02 DIAGNOSIS — I10 Essential (primary) hypertension: Secondary | ICD-10-CM | POA: Diagnosis not present

## 2024-02-02 DIAGNOSIS — Z7901 Long term (current) use of anticoagulants: Secondary | ICD-10-CM | POA: Diagnosis not present

## 2024-02-02 DIAGNOSIS — Z7984 Long term (current) use of oral hypoglycemic drugs: Secondary | ICD-10-CM | POA: Diagnosis not present

## 2024-02-02 DIAGNOSIS — E1142 Type 2 diabetes mellitus with diabetic polyneuropathy: Secondary | ICD-10-CM | POA: Diagnosis not present

## 2024-02-03 ENCOUNTER — Encounter (INDEPENDENT_AMBULATORY_CARE_PROVIDER_SITE_OTHER): Payer: Self-pay | Admitting: Otolaryngology

## 2024-02-03 ENCOUNTER — Encounter: Payer: Self-pay | Admitting: Urology

## 2024-02-03 ENCOUNTER — Ambulatory Visit: Admitting: Urology

## 2024-02-03 VITALS — BP 122/58 | HR 62 | Temp 98.2°F

## 2024-02-03 DIAGNOSIS — I82409 Acute embolism and thrombosis of unspecified deep veins of unspecified lower extremity: Secondary | ICD-10-CM | POA: Insufficient documentation

## 2024-02-03 DIAGNOSIS — C61 Malignant neoplasm of prostate: Secondary | ICD-10-CM | POA: Insufficient documentation

## 2024-02-03 DIAGNOSIS — R339 Retention of urine, unspecified: Secondary | ICD-10-CM | POA: Diagnosis not present

## 2024-02-03 DIAGNOSIS — M47816 Spondylosis without myelopathy or radiculopathy, lumbar region: Secondary | ICD-10-CM | POA: Insufficient documentation

## 2024-02-03 DIAGNOSIS — N401 Enlarged prostate with lower urinary tract symptoms: Secondary | ICD-10-CM

## 2024-02-03 DIAGNOSIS — Z87898 Personal history of other specified conditions: Secondary | ICD-10-CM

## 2024-02-03 LAB — BLADDER SCAN AMB NON-IMAGING: Scan Result: 0

## 2024-02-03 NOTE — Progress Notes (Signed)
 Name: GLENARD KEESLING DOB: 1950-09-02 MRN: [id number]  History of Present Illness: Mr. Milligan is a 73 y.o. male who presents today for follow up visit at [facility].  Relevant History includes: 1. Prostate cancer. 2. BPH. 3. Kidney stones.  Recent history: > 12/25/2023: TURP by Dr. Sherrilee. Pathology <5% Gleason 3+3=6.  > 12/31/2023:  - Postop visit with Dr. Sherrilee. Passed voiding trial.  - The plan was: 1. Follow up in a few weeks with PVR check. 2. Follow up in 3 months with PSA for prostate cancer surveillance.  Today: He reports feeling well overall.  Reports occasional small amount of blood in his urine and increased urinary urgency, frequency, and nocturia since the procedure. Denies fevers, malaise, gross hematuria, urinary hesitancy, straining to void, or sensations of incomplete emptying.   Medications: Current Outpatient Medications  Medication Sig Dispense Refill   acetaminophen  (TYLENOL ) 325 MG tablet Take 2 tablets (650 mg total) by mouth every 6 (six) hours as needed for mild pain (pain score 1-3) (or Fever >/= 101).     albuterol  (VENTOLIN  HFA) 108 (90 Base) MCG/ACT inhaler Inhale 2 puffs into the lungs every 6 (six) hours as needed for wheezing or shortness of breath. 18 g 2   apixaban  (ELIQUIS ) 5 MG TABS tablet Take 1 tablet (5 mg total) by mouth 2 (two) times daily. 60 tablet 11   ascorbic acid  (VITAMIN C ) 500 MG tablet Take 500 mg by mouth daily.     Azelastine  HCl 137 MCG/SPRAY SOLN Place 2 sprays into both nostrils 2 (two) times daily.     BD VEO INSULIN  SYRINGE U/F 31G X 15/64 0.5 ML MISC SMARTSIG:1 Unit(s) 3 Times Daily     betamethasone dipropionate 0.05 % lotion Apply 1 Application topically 2 (two) times daily as needed (irritation).     cefadroxil  (DURICEF) 500 MG capsule Take 1 capsule (500 mg total) by mouth 2 (two) times daily. 180 capsule 3   clotrimazole-betamethasone (LOTRISONE) cream Apply 1 Application topically 2 (two)  times daily as needed (irritation). Apply to sacrum and groin topically for wound care     cyanocobalamin  (VITAMIN B12) 1000 MCG/ML injection Inject 1,000 mcg into the muscle every 30 (thirty) days.     diphenhydrAMINE  (BENADRYL ) 25 MG tablet Take 25 mg by mouth every 6 (six) hours as needed for allergies.     escitalopram  (LEXAPRO ) 20 MG tablet Take 20 mg by mouth daily.     esomeprazole (NEXIUM) 40 MG capsule Take 40 mg by mouth daily.     ferrous sulfate  325 (65 FE) MG tablet Take 325 mg by mouth 2 (two) times daily.     hydrochlorothiazide  (HYDRODIURIL ) 25 MG tablet Take 25 mg by mouth daily.     JARDIANCE  25 MG TABS tablet Take 25 mg by mouth daily.     Lancets (ONETOUCH DELICA PLUS LANCET33G) MISC SMARTSIG:3 Topical Daily     levocetirizine (XYZAL ) 5 MG tablet Take 5 mg by mouth daily.     metFORMIN  (GLUCOPHAGE ) 1000 MG tablet Take 1,000 mg by mouth 2 (two) times daily.     metoprolol  succinate (TOPROL -XL) 50 MG 24 hr tablet Take 50 mg by mouth daily.     mometasone  (ELOCON ) 0.1 % ointment Apply 1 application  topically 2 (two) times daily as needed (irritation).     NOVOLIN 70/30 (70-30) 100 UNIT/ML injection Inject 12-75 Units into the skin 2 (two) times daily as needed (High Blood Sugar). Max dose 100 units daily  ONETOUCH ULTRA test strip 3 (three) times daily.     oxyCODONE  (OXY IR/ROXICODONE ) 5 MG immediate release tablet Take 1 tablet (5 mg total) by mouth every 6 (six) hours as needed for moderate pain (pain score 4-6) or severe pain (pain score 7-10). 12 tablet 0   rosuvastatin  (CRESTOR ) 20 MG tablet Take 20 mg by mouth daily.     tamsulosin  (FLOMAX ) 0.4 MG CAPS capsule Take 0.4 mg by mouth daily.     traMADol  (ULTRAM ) 50 MG tablet Take 1 tablet (50 mg total) by mouth every 6 (six) hours as needed. 15 tablet 0   trandolapril  (MAVIK ) 2 MG tablet Take 2 mg by mouth daily.     zolpidem  (AMBIEN ) 10 MG tablet Take 10 mg by mouth at bedtime as needed for sleep.     No current  facility-administered medications for this visit.    Allergies: No Known Allergies  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   CTA negative for aneurysm   Atrial fibrillation (HCC)    Complication of anesthesia    spinal headaches   Depressed    Diabetes mellitus without complication (HCC)    DVT (deep venous thrombosis) (HCC)    History of kidney stones    Hypertension    Nephrolithiasis    Sleep apnea    Past Surgical History:  Procedure Laterality Date   ANTERIOR CERVICAL DECOMP/DISCECTOMY FUSION N/A 05/22/2022   Procedure: ANTERIOR CERVICAL DISCECTOMY FUSION, INTERBODY PROSTHESIS,PLATE/SCREWS CERVICAL THREE-FOUR, CERVICAL FOUR-FIVE;REMOVAL CERVICAL PLATE;  Surgeon: Mavis Purchase, MD;  [facility] OR;  Service: Neurosurgery;  Laterality: N/A;  3C   CATARACT EXTRACTION W/PHACO Left 09/23/2022   Procedure: CATARACT EXTRACTION PHACO AND INTRAOCULAR LENS PLACEMENT (IOC);  Surgeon: Harrie Agent, MD;  Location: [facility]S;  Service: Ophthalmology;  Laterality: Left;  CDE: 7.52   CATARACT EXTRACTION W/PHACO Right 10/07/2022   Procedure: CATARACT EXTRACTION PHACO AND INTRAOCULAR LENS PLACEMENT (IOC);  Surgeon: Harrie Agent, MD;  Location: [facility] OR[facility];  Service: Ophthalmology;  Laterality: Right;  CDE: 9.40   IR IVC FILTER PLMT / S&I /IMG GUID/MOD SED  01/19/2023   LUMBAR LAMINECTOMY/DECOMPRESSION MICRODISCECTOMY N/A 01/20/2023   Procedure: LUMBAR LAM FOR EPIDURAL ABSCESS, LUMBAR THREE-FOUR, LUMBAR FOUR-FIVE,  LUMBAR FIVE-SACRAL ONE;  Surgeon: Mavis Purchase, MD;  Location: [facility] OR;  Service: Neurosurgery;  Laterality: N/A;   STOMACH SURGERY     TEE WITHOUT CARDIOVERSION N/A 12/31/2022   Procedure: TRANSESOPHAGEAL ECHOCARDIOGRAM;  Surgeon: Kate Lonni CROME, MD;  Location: [facility] INVASIVE CV LAB;  Service: Cardiovascular;  Laterality: N/A;   TONSILLECTOMY  2000   TRANSURETHRAL RESECTION OF PROSTATE N/A 12/25/2023    Procedure: TURP (TRANSURETHRAL RESECTION OF PROSTATE);  Surgeon: Sherrilee Belvie CROME, MD;  Location: [facility];  Service: Urology;  Laterality: N/A;   Family History  Problem Relation Age of Onset   Heart attack Mother    Heart attack Father    Hypertension Brother    Diabetes Brother    Coronary artery disease Brother    Social History   Socioeconomic History   Marital status: Significant Other    Spouse name: Not on file   Number of children: 2   Years of education: Not on file   Highest education level: Not on file  Occupational History   Not on file  Tobacco Use   Smoking status: Former    Current packs/day: 0.00    Types: Cigarettes    Quit date: 66    Years  since quitting: 31.5    Passive exposure: Never   Smokeless tobacco: Never  Vaping Use   Vaping status: Never Used  Substance and Sexual Activity   Alcohol  use: Not Currently   Drug use: Never   Sexual activity: Yes  Other Topics Concern   Not on file  Social History Narrative   Not on file   Social Drivers of Health   Financial Resource Strain: Low Risk  (07/03/2023)   Received from [facility]   Overall Financial Resource Strain (CARDIA)    Difficulty of Paying Living Expenses: Not hard at all  Food Insecurity: No Food Insecurity (12/25/2023)   Hunger Vital Sign    Worried About Running Out of Food in the Last Year: Never true    Ran Out of Food in the Last Year: Never true  Transportation Needs: No Transportation Needs (12/25/2023)   PRAPARE - Administrator, Civil Service (Medical): No    Lack of Transportation (Non-Medical): No  Physical Activity: Inactive (07/03/2023)   Received from [facility]   Exercise Vital Sign    On average, how many days per week do you engage in moderate to strenuous exercise (like a brisk walk)?: 0 days    On average, how many minutes do you engage in exercise at this level?: 0 min  Stress: No Stress Concern Present (07/03/2023)   Received from [facility] of Occupational Health - Occupational Stress Questionnaire    Feeling of Stress : Not at all  Social Connections: Moderately Integrated (12/25/2023)   Social Connection and Isolation Panel    Frequency of Communication with Friends and Family: Three times a week    Frequency of Social Gatherings with Friends and Family: Three times a week    Attends Religious Services: 1 to 4 times per year    Active Member of Clubs or Organizations: Yes    Attends Banker Meetings: 1 to 4 times per year    Marital Status: Widowed  Intimate Partner Violence: Not At Risk (12/25/2023)   Humiliation, Afraid, Rape, and Kick questionnaire    Fear of Current or Ex-Partner: No    Emotionally Abused: No    Physically Abused: No    Sexually Abused: No    Review of Systems Constitutional: Patient denies any unintentional weight loss or change in strength lntegumentary: Patient denies any rashes or pruritus Cardiovascular: Patient denies chest pain or syncope Respiratory: Patient denies shortness of breath Gastrointestinal: Patient denies nausea, vomiting, constipation, or diarrhea  Musculoskeletal: Patient denies muscle cramps or weakness Neurologic: Patient denies convulsions or seizures Allergic/Immunologic: Patient denies recent allergic reaction(s) Hematologic/Lymphatic: Patient denies bleeding tendencies Endocrine: Patient denies heat/cold intolerance  GU: As per HPI.  OBJECTIVE Vitals:   02/03/24 1504  BP: (!) 122/58  Pulse: 62  Temp: 98.2 F (36.8 C)   There is no height or weight on file to calculate BMI.  Physical Examination Constitutional: No obvious distress; patient is non-toxic appearing  Cardiovascular: No visible lower extremity edema.  Respiratory: The patient does not have audible wheezing/stridor; respirations do not appear labored  Gastrointestinal: Abdomen non-distended Musculoskeletal: Normal ROM of UEs  Skin: No obvious  rashes/open sores  Neurologic: CN 2-12 grossly intact Psychiatric: Answered questions appropriately with normal affect  Hematologic/Lymphatic/Immunologic: No obvious bruises or sites of spontaneous bleeding  UA/microscopy: >30 RBC/hpf, glucosuria (secondary to Jardiance  use)  PVR: 0 ml  ASSESSMENT Prostate cancer (HCC) - Plan: Urinalysis, Routine w  reflex microscopic, BLADDER SCAN AMB NON-IMAGING  Benign prostatic hyperplasia with urinary retention - Plan: Urinalysis, Routine w reflex microscopic, BLADDER SCAN AMB NON-IMAGING  Normal PVR today; no evidence of urinary retention / incomplete bladder emptying.  We reviewed surgical history. He was reassured that his current urinary symptoms are fairly typical at this point and are anticipated to improve / resolve with time as postop healing continues.  Will plan for follow up as previously scheduled with Dr. Sherrilee on 04/09/2024 or sooner if needed. Pt verbalized understanding and agreement. All questions were answered.  PLAN Advised the following: 1. Continue Flomax  (Tamsulosin ). 2. Return in 2 months (on 04/09/2024) for f/u as previously scheduled with Dr. Sherrilee (lab visit for PSA prior).  Orders Placed This Encounter  Procedures   Urinalysis, Routine w reflex microscopic   BLADDER SCAN AMB NON-IMAGING    It has been explained that the patient is to follow regularly with their PCP in addition to all other providers involved in their care and to follow instructions provided by these respective offices. Patient advised to contact urology clinic if any urologic-pertaining questions, concerns, new symptoms or problems arise in the interim period.  There are no Patient Instructions on file for this visit.  Electronically signed by:  Lauraine JAYSON Oz, FNP   02/03/24    3:35 PM

## 2024-02-03 NOTE — Progress Notes (Signed)
 Bladder Scan completed today.  Patient can void prior to the bladder scan. Bladder scan result: 0  Performed By: [facility] LPN

## 2024-02-04 DIAGNOSIS — Z7984 Long term (current) use of oral hypoglycemic drugs: Secondary | ICD-10-CM | POA: Diagnosis not present

## 2024-02-04 DIAGNOSIS — Z9181 History of falling: Secondary | ICD-10-CM | POA: Diagnosis not present

## 2024-02-04 DIAGNOSIS — E278 Other specified disorders of adrenal gland: Secondary | ICD-10-CM | POA: Diagnosis not present

## 2024-02-04 DIAGNOSIS — I480 Paroxysmal atrial fibrillation: Secondary | ICD-10-CM | POA: Diagnosis not present

## 2024-02-04 DIAGNOSIS — Z7901 Long term (current) use of anticoagulants: Secondary | ICD-10-CM | POA: Diagnosis not present

## 2024-02-04 DIAGNOSIS — E1142 Type 2 diabetes mellitus with diabetic polyneuropathy: Secondary | ICD-10-CM | POA: Diagnosis not present

## 2024-02-04 DIAGNOSIS — N400 Enlarged prostate without lower urinary tract symptoms: Secondary | ICD-10-CM | POA: Diagnosis not present

## 2024-02-04 DIAGNOSIS — Z87891 Personal history of nicotine dependence: Secondary | ICD-10-CM | POA: Diagnosis not present

## 2024-02-04 DIAGNOSIS — I10 Essential (primary) hypertension: Secondary | ICD-10-CM | POA: Diagnosis not present

## 2024-02-04 DIAGNOSIS — F1120 Opioid dependence, uncomplicated: Secondary | ICD-10-CM | POA: Diagnosis not present

## 2024-02-04 LAB — URINALYSIS, ROUTINE W REFLEX MICROSCOPIC
Bilirubin, UA: NEGATIVE
Ketones, UA: NEGATIVE
Nitrite, UA: NEGATIVE
Protein,UA: NEGATIVE
Specific Gravity, UA: 1.01 (ref 1.005–1.030)
Urobilinogen, Ur: 0.2 mg/dL (ref 0.2–1.0)
pH, UA: 6 (ref 5.0–7.5)

## 2024-02-04 LAB — MICROSCOPIC EXAMINATION
Bacteria, UA: NONE SEEN
RBC, Urine: >30 /HPF — AB (ref 0–2)

## 2024-02-06 DIAGNOSIS — R2681 Unsteadiness on feet: Secondary | ICD-10-CM | POA: Diagnosis not present

## 2024-02-09 DIAGNOSIS — I10 Essential (primary) hypertension: Secondary | ICD-10-CM | POA: Diagnosis not present

## 2024-02-09 DIAGNOSIS — E1142 Type 2 diabetes mellitus with diabetic polyneuropathy: Secondary | ICD-10-CM | POA: Diagnosis not present

## 2024-02-09 DIAGNOSIS — F1120 Opioid dependence, uncomplicated: Secondary | ICD-10-CM | POA: Diagnosis not present

## 2024-02-09 DIAGNOSIS — I480 Paroxysmal atrial fibrillation: Secondary | ICD-10-CM | POA: Diagnosis not present

## 2024-02-10 DIAGNOSIS — E1142 Type 2 diabetes mellitus with diabetic polyneuropathy: Secondary | ICD-10-CM | POA: Diagnosis not present

## 2024-02-10 DIAGNOSIS — E278 Other specified disorders of adrenal gland: Secondary | ICD-10-CM | POA: Diagnosis not present

## 2024-02-10 DIAGNOSIS — I480 Paroxysmal atrial fibrillation: Secondary | ICD-10-CM | POA: Diagnosis not present

## 2024-02-10 DIAGNOSIS — Z7984 Long term (current) use of oral hypoglycemic drugs: Secondary | ICD-10-CM | POA: Diagnosis not present

## 2024-02-10 DIAGNOSIS — F1120 Opioid dependence, uncomplicated: Secondary | ICD-10-CM | POA: Diagnosis not present

## 2024-02-10 DIAGNOSIS — Z7901 Long term (current) use of anticoagulants: Secondary | ICD-10-CM | POA: Diagnosis not present

## 2024-02-10 DIAGNOSIS — I10 Essential (primary) hypertension: Secondary | ICD-10-CM | POA: Diagnosis not present

## 2024-02-10 DIAGNOSIS — Z87891 Personal history of nicotine dependence: Secondary | ICD-10-CM | POA: Diagnosis not present

## 2024-02-10 DIAGNOSIS — N400 Enlarged prostate without lower urinary tract symptoms: Secondary | ICD-10-CM | POA: Diagnosis not present

## 2024-02-10 DIAGNOSIS — Z9181 History of falling: Secondary | ICD-10-CM | POA: Diagnosis not present

## 2024-02-16 DIAGNOSIS — H43821 Vitreomacular adhesion, right eye: Secondary | ICD-10-CM | POA: Diagnosis not present

## 2024-02-16 DIAGNOSIS — E113313 Type 2 diabetes mellitus with moderate nonproliferative diabetic retinopathy with macular edema, bilateral: Secondary | ICD-10-CM | POA: Diagnosis not present

## 2024-02-16 DIAGNOSIS — H34812 Central retinal vein occlusion, left eye, with macular edema: Secondary | ICD-10-CM | POA: Diagnosis not present

## 2024-02-16 DIAGNOSIS — Z961 Presence of intraocular lens: Secondary | ICD-10-CM | POA: Diagnosis not present

## 2024-02-16 DIAGNOSIS — H35372 Puckering of macula, left eye: Secondary | ICD-10-CM | POA: Diagnosis not present

## 2024-02-16 DIAGNOSIS — H43812 Vitreous degeneration, left eye: Secondary | ICD-10-CM | POA: Diagnosis not present

## 2024-02-16 DIAGNOSIS — H31092 Other chorioretinal scars, left eye: Secondary | ICD-10-CM | POA: Diagnosis not present

## 2024-02-16 DIAGNOSIS — H35033 Hypertensive retinopathy, bilateral: Secondary | ICD-10-CM | POA: Diagnosis not present

## 2024-02-18 DIAGNOSIS — E1142 Type 2 diabetes mellitus with diabetic polyneuropathy: Secondary | ICD-10-CM | POA: Diagnosis not present

## 2024-02-18 DIAGNOSIS — F1120 Opioid dependence, uncomplicated: Secondary | ICD-10-CM | POA: Diagnosis not present

## 2024-02-18 DIAGNOSIS — I10 Essential (primary) hypertension: Secondary | ICD-10-CM | POA: Diagnosis not present

## 2024-02-18 DIAGNOSIS — E278 Other specified disorders of adrenal gland: Secondary | ICD-10-CM | POA: Diagnosis not present

## 2024-02-18 DIAGNOSIS — Z7901 Long term (current) use of anticoagulants: Secondary | ICD-10-CM | POA: Diagnosis not present

## 2024-02-18 DIAGNOSIS — I480 Paroxysmal atrial fibrillation: Secondary | ICD-10-CM | POA: Diagnosis not present

## 2024-02-18 DIAGNOSIS — N400 Enlarged prostate without lower urinary tract symptoms: Secondary | ICD-10-CM | POA: Diagnosis not present

## 2024-02-18 DIAGNOSIS — Z9181 History of falling: Secondary | ICD-10-CM | POA: Diagnosis not present

## 2024-02-18 DIAGNOSIS — Z7984 Long term (current) use of oral hypoglycemic drugs: Secondary | ICD-10-CM | POA: Diagnosis not present

## 2024-02-18 DIAGNOSIS — Z87891 Personal history of nicotine dependence: Secondary | ICD-10-CM | POA: Diagnosis not present

## 2024-02-23 DIAGNOSIS — N400 Enlarged prostate without lower urinary tract symptoms: Secondary | ICD-10-CM | POA: Diagnosis not present

## 2024-02-23 DIAGNOSIS — Z7984 Long term (current) use of oral hypoglycemic drugs: Secondary | ICD-10-CM | POA: Diagnosis not present

## 2024-02-23 DIAGNOSIS — F1120 Opioid dependence, uncomplicated: Secondary | ICD-10-CM | POA: Diagnosis not present

## 2024-02-23 DIAGNOSIS — Z87891 Personal history of nicotine dependence: Secondary | ICD-10-CM | POA: Diagnosis not present

## 2024-02-23 DIAGNOSIS — Z9181 History of falling: Secondary | ICD-10-CM | POA: Diagnosis not present

## 2024-02-23 DIAGNOSIS — I10 Essential (primary) hypertension: Secondary | ICD-10-CM | POA: Diagnosis not present

## 2024-02-23 DIAGNOSIS — E1142 Type 2 diabetes mellitus with diabetic polyneuropathy: Secondary | ICD-10-CM | POA: Diagnosis not present

## 2024-02-23 DIAGNOSIS — E278 Other specified disorders of adrenal gland: Secondary | ICD-10-CM | POA: Diagnosis not present

## 2024-02-23 DIAGNOSIS — I480 Paroxysmal atrial fibrillation: Secondary | ICD-10-CM | POA: Diagnosis not present

## 2024-02-23 DIAGNOSIS — Z7901 Long term (current) use of anticoagulants: Secondary | ICD-10-CM | POA: Diagnosis not present

## 2024-02-26 DIAGNOSIS — E1165 Type 2 diabetes mellitus with hyperglycemia: Secondary | ICD-10-CM | POA: Diagnosis not present

## 2024-02-27 DIAGNOSIS — R600 Localized edema: Secondary | ICD-10-CM | POA: Diagnosis not present

## 2024-02-27 DIAGNOSIS — E119 Type 2 diabetes mellitus without complications: Secondary | ICD-10-CM | POA: Diagnosis not present

## 2024-02-27 DIAGNOSIS — E785 Hyperlipidemia, unspecified: Secondary | ICD-10-CM | POA: Diagnosis not present

## 2024-02-27 DIAGNOSIS — J984 Other disorders of lung: Secondary | ICD-10-CM | POA: Diagnosis not present

## 2024-02-27 DIAGNOSIS — I509 Heart failure, unspecified: Secondary | ICD-10-CM | POA: Diagnosis not present

## 2024-02-27 DIAGNOSIS — M6281 Muscle weakness (generalized): Secondary | ICD-10-CM | POA: Diagnosis not present

## 2024-02-27 DIAGNOSIS — M4626 Osteomyelitis of vertebra, lumbar region: Secondary | ICD-10-CM | POA: Diagnosis not present

## 2024-03-01 DIAGNOSIS — D649 Anemia, unspecified: Secondary | ICD-10-CM | POA: Diagnosis not present

## 2024-03-01 DIAGNOSIS — K921 Melena: Secondary | ICD-10-CM | POA: Diagnosis not present

## 2024-03-05 ENCOUNTER — Ambulatory Visit: Admitting: Internal Medicine

## 2024-03-11 DIAGNOSIS — K635 Polyp of colon: Secondary | ICD-10-CM | POA: Diagnosis not present

## 2024-03-11 DIAGNOSIS — D122 Benign neoplasm of ascending colon: Secondary | ICD-10-CM | POA: Diagnosis not present

## 2024-03-11 DIAGNOSIS — K5730 Diverticulosis of large intestine without perforation or abscess without bleeding: Secondary | ICD-10-CM | POA: Diagnosis not present

## 2024-03-11 DIAGNOSIS — K2970 Gastritis, unspecified, without bleeding: Secondary | ICD-10-CM | POA: Diagnosis not present

## 2024-03-15 DIAGNOSIS — H34812 Central retinal vein occlusion, left eye, with macular edema: Secondary | ICD-10-CM | POA: Diagnosis not present

## 2024-03-27 DIAGNOSIS — E1165 Type 2 diabetes mellitus with hyperglycemia: Secondary | ICD-10-CM | POA: Diagnosis not present

## 2024-03-29 DIAGNOSIS — I509 Heart failure, unspecified: Secondary | ICD-10-CM | POA: Diagnosis not present

## 2024-03-29 DIAGNOSIS — E785 Hyperlipidemia, unspecified: Secondary | ICD-10-CM | POA: Diagnosis not present

## 2024-03-29 DIAGNOSIS — E119 Type 2 diabetes mellitus without complications: Secondary | ICD-10-CM | POA: Diagnosis not present

## 2024-03-29 DIAGNOSIS — M4626 Osteomyelitis of vertebra, lumbar region: Secondary | ICD-10-CM | POA: Diagnosis not present

## 2024-03-29 DIAGNOSIS — M6281 Muscle weakness (generalized): Secondary | ICD-10-CM | POA: Diagnosis not present

## 2024-03-29 DIAGNOSIS — J984 Other disorders of lung: Secondary | ICD-10-CM | POA: Diagnosis not present

## 2024-03-29 DIAGNOSIS — R600 Localized edema: Secondary | ICD-10-CM | POA: Diagnosis not present

## 2024-03-30 ENCOUNTER — Other Ambulatory Visit

## 2024-03-30 DIAGNOSIS — C61 Malignant neoplasm of prostate: Secondary | ICD-10-CM

## 2024-03-31 LAB — PSA: Prostate Specific Ag, Serum: <0.1 ng/mL (ref 0.0–4.0)

## 2024-04-01 ENCOUNTER — Institutional Professional Consult (permissible substitution) (INDEPENDENT_AMBULATORY_CARE_PROVIDER_SITE_OTHER): Admitting: Otolaryngology

## 2024-04-01 DIAGNOSIS — M79672 Pain in left foot: Secondary | ICD-10-CM | POA: Diagnosis not present

## 2024-04-01 DIAGNOSIS — E1142 Type 2 diabetes mellitus with diabetic polyneuropathy: Secondary | ICD-10-CM | POA: Diagnosis not present

## 2024-04-01 DIAGNOSIS — M722 Plantar fascial fibromatosis: Secondary | ICD-10-CM | POA: Diagnosis not present

## 2024-04-01 DIAGNOSIS — I739 Peripheral vascular disease, unspecified: Secondary | ICD-10-CM | POA: Diagnosis not present

## 2024-04-06 ENCOUNTER — Ambulatory Visit: Payer: Self-pay | Admitting: Urology

## 2024-04-06 ENCOUNTER — Other Ambulatory Visit (HOSPITAL_COMMUNITY): Payer: Self-pay | Admitting: Podiatry

## 2024-04-06 DIAGNOSIS — I739 Peripheral vascular disease, unspecified: Secondary | ICD-10-CM

## 2024-04-07 ENCOUNTER — Ambulatory Visit: Admitting: Internal Medicine

## 2024-04-07 DIAGNOSIS — K5790 Diverticulosis of intestine, part unspecified, without perforation or abscess without bleeding: Secondary | ICD-10-CM | POA: Diagnosis not present

## 2024-04-07 DIAGNOSIS — D122 Benign neoplasm of ascending colon: Secondary | ICD-10-CM | POA: Diagnosis not present

## 2024-04-09 ENCOUNTER — Encounter: Payer: Self-pay | Admitting: Urology

## 2024-04-09 ENCOUNTER — Ambulatory Visit: Admitting: Urology

## 2024-04-09 VITALS — BP 118/61 | HR 76

## 2024-04-09 DIAGNOSIS — C61 Malignant neoplasm of prostate: Secondary | ICD-10-CM | POA: Diagnosis not present

## 2024-04-09 DIAGNOSIS — R338 Other retention of urine: Secondary | ICD-10-CM | POA: Diagnosis not present

## 2024-04-09 DIAGNOSIS — N401 Enlarged prostate with lower urinary tract symptoms: Secondary | ICD-10-CM

## 2024-04-09 DIAGNOSIS — R339 Retention of urine, unspecified: Secondary | ICD-10-CM

## 2024-04-09 LAB — URINALYSIS, ROUTINE W REFLEX MICROSCOPIC
Bilirubin, UA: NEGATIVE
Ketones, UA: NEGATIVE
Leukocytes,UA: NEGATIVE
Nitrite, UA: NEGATIVE
Protein,UA: NEGATIVE
RBC, UA: NEGATIVE
Specific Gravity, UA: 1.015 (ref 1.005–1.030)
Urobilinogen, Ur: 1 mg/dL (ref 0.2–1.0)
pH, UA: 6 (ref 5.0–7.5)

## 2024-04-09 MED ORDER — MIRABEGRON ER 25 MG PO TB24: 25.0000 mg | ORAL_TABLET | Freq: Every day | ORAL | 11 refills | Status: DC

## 2024-04-09 MED ORDER — MIRABEGRON ER 25 MG PO TB24: 25.0000 mg | ORAL_TABLET | Freq: Every day | ORAL | 3 refills | Status: DC

## 2024-04-09 NOTE — Patient Instructions (Signed)

## 2024-04-09 NOTE — Progress Notes (Signed)
 04/09/2024 12:46 PM   Jim Ramirez 05-Aug-1950 [id number]  Referring provider: Maree Isles, MD [address] [place],  [place] [address]   Urinary frequency  HPI: [name] is a 73 yo here for followup for BPh and prostate cancer. PSA undetectable. IPSS 5 QOl 0 after TURP. His only ussue is urinary urgency with occasional incontinence   PMH: 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   CTA negative for aneurysm   Atrial fibrillation (HCC)    Complication of anesthesia    spinal headaches   Depressed    Diabetes mellitus without complication (HCC)    DVT (deep venous thrombosis) (HCC)    History of kidney stones    Hypertension    Nephrolithiasis    Sleep apnea     Surgical History: Past Surgical History:  Procedure Laterality Date   ANTERIOR CERVICAL DECOMP/DISCECTOMY FUSION N/A 05/22/2022   Procedure: ANTERIOR CERVICAL DISCECTOMY FUSION, INTERBODY PROSTHESIS,PLATE/SCREWS CERVICAL THREE-FOUR, CERVICAL FOUR-FIVE;REMOVAL CERVICAL PLATE;  Surgeon: Jim Purchase, MD;  Location: [facility] OR;  Service: Neurosurgery;  Laterality: N/A;  3C   CATARACT EXTRACTION W/PHACO Left 09/23/2022   Procedure: CATARACT EXTRACTION PHACO AND INTRAOCULAR LENS PLACEMENT (IOC);  Surgeon: Jim Agent, MD;  Location: [facility];  Service: Ophthalmology;  Laterality: Left;  CDE: 7.52   CATARACT EXTRACTION W/PHACO Right 10/07/2022   Procedure: CATARACT EXTRACTION PHACO AND INTRAOCULAR LENS PLACEMENT (IOC);  Surgeon: Jim Agent, MD;  Location: [facility];  Service: Ophthalmology;  Laterality: Right;  CDE: 9.40   IR IVC FILTER PLMT / S&I /IMG GUID/MOD SED  01/19/2023   LUMBAR LAMINECTOMY/DECOMPRESSION MICRODISCECTOMY N/A 01/20/2023   Procedure: LUMBAR LAM FOR EPIDURAL ABSCESS, LUMBAR THREE-FOUR, LUMBAR FOUR-FIVE,  LUMBAR FIVE-SACRAL ONE;  Surgeon: Jim Purchase, MD;  Location: [facility] OR;  Service: Neurosurgery;  Laterality: N/A;   STOMACH SURGERY      TEE WITHOUT CARDIOVERSION N/A 12/31/2022   Procedure: TRANSESOPHAGEAL ECHOCARDIOGRAM;  Surgeon: Jim Lonni CROME, MD;  Location: [facility] INVASIVE CV LAB;  Service: Cardiovascular;  Laterality: N/A;   TONSILLECTOMY  2000   TRANSURETHRAL RESECTION OF PROSTATE N/A 12/25/2023   Procedure: TURP (TRANSURETHRAL RESECTION OF PROSTATE);  Surgeon: Jim Belvie CROME, MD;  Location: [facility];  Service: Urology;  Laterality: N/A;    Home Medications:  Allergies as of 04/09/2024   No Known Allergies      Medication List        Accurate as of April 09, 2024 12:46 PM. If you have any questions, ask your nurse or doctor.          acetaminophen  325 MG tablet Commonly known as: TYLENOL  Take 2 tablets (650 mg total) by mouth every 6 (six) hours as needed for mild pain (pain score 1-3) (or Fever >/= 101).   albuterol  108 (90 Base) MCG/ACT inhaler Commonly known as: VENTOLIN  HFA Inhale 2 puffs into the lungs every 6 (six) hours as needed for wheezing or shortness of breath.   apixaban  5 MG Tabs tablet Commonly known as: ELIQUIS  Take 1 tablet (5 mg total) by mouth 2 (two) times daily.   ascorbic acid  500 MG tablet Commonly known as: VITAMIN C  Take 500 mg by mouth daily.   Azelastine  HCl 137 MCG/SPRAY Soln Place 2 sprays into both nostrils 2 (two) times daily.   BD Veo Insulin  Syringe U/F 31G X 15/64 0.5 ML Misc Generic drug: Insulin  Syringe-Needle U-100 SMARTSIG:1 Unit(s) 3 Times Daily   betamethasone dipropionate 0.05 % lotion Apply 1 Application  topically 2 (two) times daily as needed (irritation).   cefadroxil  500 MG capsule Commonly known as: DURICEF Take 1 capsule (500 mg total) by mouth 2 (two) times daily.   clotrimazole-betamethasone cream Commonly known as: LOTRISONE Apply 1 Application topically 2 (two) times daily as needed (irritation). Apply to sacrum and groin topically for wound care   cyanocobalamin  1000 MCG/ML injection Commonly known as: VITAMIN B12 Inject  1,000 mcg into the muscle every 30 (thirty) days.   diphenhydrAMINE  25 MG tablet Commonly known as: BENADRYL  Take 25 mg by mouth every 6 (six) hours as needed for allergies.   escitalopram  20 MG tablet Commonly known as: LEXAPRO  Take 20 mg by mouth daily.   esomeprazole 40 MG capsule Commonly known as: NEXIUM Take 40 mg by mouth daily.   ferrous sulfate  325 (65 FE) MG tablet Take 325 mg by mouth 2 (two) times daily.   hydrochlorothiazide  25 MG tablet Commonly known as: HYDRODIURIL  Take 25 mg by mouth daily.   Jardiance  25 MG Tabs tablet Generic drug: empagliflozin  Take 25 mg by mouth daily.   levocetirizine 5 MG tablet Commonly known as: XYZAL  Take 5 mg by mouth daily.   metFORMIN  1000 MG tablet Commonly known as: GLUCOPHAGE  Take 1,000 mg by mouth 2 (two) times daily.   metoprolol  succinate 50 MG 24 hr tablet Commonly known as: TOPROL -XL Take 50 mg by mouth daily.   mometasone  0.1 % ointment Commonly known as: ELOCON  Apply 1 application  topically 2 (two) times daily as needed (irritation).   NovoLIN 70/30 (70-30) 100 UNIT/ML injection Generic drug: insulin  NPH-regular Human Inject 12-75 Units into the skin 2 (two) times daily as needed (High Blood Sugar). Max dose 100 units daily   OneTouch Delica Plus Lancet33G Misc SMARTSIG:3 Topical Daily   OneTouch Ultra test strip Generic drug: glucose blood 3 (three) times daily.   oxyCODONE  5 MG immediate release tablet Commonly known as: Oxy IR/ROXICODONE  Take 1 tablet (5 mg total) by mouth every 6 (six) hours as needed for moderate pain (pain score 4-6) or severe pain (pain score 7-10).   rosuvastatin  20 MG tablet Commonly known as: CRESTOR  Take 20 mg by mouth daily.   tamsulosin  0.4 MG Caps capsule Commonly known as: FLOMAX  Take 0.4 mg by mouth daily.   traMADol  50 MG tablet Commonly known as: Ultram  Take 1 tablet (50 mg total) by mouth every 6 (six) hours as needed.   trandolapril  2 MG tablet Commonly  known as: MAVIK  Take 2 mg by mouth daily.   zolpidem  10 MG tablet Commonly known as: AMBIEN  Take 10 mg by mouth at bedtime as needed for sleep.        Allergies: No Known Allergies  Family History: Family History  Problem Relation Age of Onset   Heart attack Mother    Heart attack Father    Hypertension Brother    Diabetes Brother    Coronary artery disease Brother     Social History:  reports that he quit smoking about 31 years ago. His smoking use included cigarettes. He has never been exposed to tobacco smoke. He has never used smokeless tobacco. He reports that he does not currently use alcohol . He reports that he does not use drugs.  ROS: All other review of systems were reviewed and are negative except what is noted above in HPI  Physical Exam: BP 118/61   Pulse 76   Constitutional:  Alert and oriented, No acute distress. HEENT: [place] AT, moist mucus membranes.  Trachea  midline, no masses. Cardiovascular: No clubbing, cyanosis, or edema. Respiratory: Normal respiratory effort, no increased work of breathing. GI: Abdomen is soft, nontender, nondistended, no abdominal masses GU: No CVA tenderness.  Lymph: No cervical or inguinal lymphadenopathy. Skin: No rashes, bruises or suspicious lesions. Neurologic: Grossly intact, no focal deficits, moving all 4 extremities. Psychiatric: Normal mood and affect.  Laboratory Data: Lab Results  Component Value Date   WBC 8.5 12/26/2023   HGB 11.6 (L) 12/26/2023   HCT 35.1 (L) 12/26/2023   MCV 90.2 12/26/2023   PLT 115 (L) 12/26/2023    Lab Results  Component Value Date   CREATININE 0.66 12/26/2023    No results found for: PSA  No results found for: TESTOSTERONE  Lab Results  Component Value Date   HGBA1C 8.8 (H) 12/25/2023    Urinalysis    Component Value Date/Time   COLORURINE YELLOW 11/24/2023 1115   APPEARANCEUR Clear 02/03/2024 1456   LABSPEC 1.025 11/24/2023 1115   PHURINE 5.0 11/24/2023 1115    GLUCOSEU 3+ (A) 02/03/2024 1456   HGBUR NEGATIVE 11/24/2023 1115   BILIRUBINUR Negative 02/03/2024 1456   KETONESUR NEGATIVE 11/24/2023 1115   PROTEINUR Negative 02/03/2024 1456   PROTEINUR NEGATIVE 11/24/2023 1115   UROBILINOGEN 2.0 (H) 05/09/2010 0945   NITRITE Negative 02/03/2024 1456   NITRITE NEGATIVE 11/24/2023 1115   LEUKOCYTESUR Trace (A) 02/03/2024 1456   LEUKOCYTESUR TRACE (A) 11/24/2023 1115    Lab Results  Component Value Date   LABMICR See below: 02/03/2024   WBCUA 0-5 02/03/2024   LABEPIT 0-10 02/03/2024   BACTERIA None seen 02/03/2024    Pertinent Imaging:  Results for orders placed during the hospital encounter of 11/17/08  DG Abd 1 View  Narrative Clinical Data: Renal calculus, left side pain  ABDOMEN - 1 VIEW  Comparison: 04/28/2006 Correlation:  CT abdomen 11/17/2008  Findings: Small calculus identified in left kidney on preceding CT not radiographically evident. No definite urinary tract calcification visualized. Bones unremarkable. Bowel gas pattern normal.  IMPRESSION: No urinary tract calcifications identified. Please refer to preceding CT abdomen and pelvis of 11/17/2008.  Provider: Dwayne Goltz  No results found for this or any previous visit.  No results found for this or any previous visit.  No results found for this or any previous visit.  No results found for this or any previous visit.  No results found for this or any previous visit.  No results found for this or any previous visit.  No results found for this or any previous visit.   Assessment & Plan:    1. Prostate cancer (HCC) (Primary) Followup 6 months with PSA - Urinalysis, Routine w reflex microscopic  2. Benign prostatic hyperplasia with urinary retention Improved after TURP  3. Urinary retention Resolved  4. OAB -we will trial mirabegron  25mg  daily   No follow-ups on file.  Belvie Clara, MD  [facility] [place]

## 2024-04-12 DIAGNOSIS — H34812 Central retinal vein occlusion, left eye, with macular edema: Secondary | ICD-10-CM | POA: Diagnosis not present

## 2024-04-13 ENCOUNTER — Ambulatory Visit (HOSPITAL_COMMUNITY)
Admission: RE | Admit: 2024-04-13 | Discharge: 2024-04-13 | Disposition: A | Discharge status: Home / Self Care | Source: Ambulatory Visit | Attending: Podiatry | Admitting: Podiatry

## 2024-04-13 DIAGNOSIS — I739 Peripheral vascular disease, unspecified: Secondary | ICD-10-CM | POA: Insufficient documentation

## 2024-04-20 ENCOUNTER — Telehealth: Payer: Self-pay | Admitting: Urology

## 2024-04-20 DIAGNOSIS — R339 Retention of urine, unspecified: Secondary | ICD-10-CM

## 2024-04-20 MED ORDER — MIRABEGRON ER 50 MG PO TB24: 50.0000 mg | ORAL_TABLET | Freq: Every day | ORAL | 1 refills | Status: DC

## 2024-04-20 NOTE — Telephone Encounter (Signed)
 Patient called into the office today with general questions/concerns regarding Myrbetriq  Patient may be reached at [phone] to discuss questions. States medication is not working and would like to know if he should double it or try something new.

## 2024-04-20 NOTE — Telephone Encounter (Signed)
 Pt is made aware and verbalized understanding Per Dr. Feliciano would go up on the dose to 50 mg

## 2024-04-20 NOTE — Telephone Encounter (Signed)
 Return call to pt. Patient is made aware to continued Myrbetriq  25 mg for a few more weeks to see if there is any improvement. Pt is aware I can send a message to Dr. Sherrilee on alternative medication. Verbalized understanding

## 2024-04-27 DIAGNOSIS — E1165 Type 2 diabetes mellitus with hyperglycemia: Secondary | ICD-10-CM | POA: Diagnosis not present

## 2024-04-28 DIAGNOSIS — M6281 Muscle weakness (generalized): Secondary | ICD-10-CM | POA: Diagnosis not present

## 2024-04-28 DIAGNOSIS — R600 Localized edema: Secondary | ICD-10-CM | POA: Diagnosis not present

## 2024-04-28 DIAGNOSIS — E119 Type 2 diabetes mellitus without complications: Secondary | ICD-10-CM | POA: Diagnosis not present

## 2024-04-28 DIAGNOSIS — I509 Heart failure, unspecified: Secondary | ICD-10-CM | POA: Diagnosis not present

## 2024-04-28 DIAGNOSIS — J984 Other disorders of lung: Secondary | ICD-10-CM | POA: Diagnosis not present

## 2024-04-28 DIAGNOSIS — M4626 Osteomyelitis of vertebra, lumbar region: Secondary | ICD-10-CM | POA: Diagnosis not present

## 2024-04-28 DIAGNOSIS — E785 Hyperlipidemia, unspecified: Secondary | ICD-10-CM | POA: Diagnosis not present

## 2024-05-07 ENCOUNTER — Ambulatory Visit: Admitting: Internal Medicine

## 2024-05-07 ENCOUNTER — Other Ambulatory Visit: Payer: Self-pay

## 2024-05-07 VITALS — BP 126/73 | HR 64 | Temp 97.6°F | Wt 211.0 lb

## 2024-05-07 DIAGNOSIS — E11621 Type 2 diabetes mellitus with foot ulcer: Secondary | ICD-10-CM

## 2024-05-07 DIAGNOSIS — T847XXD Infection and inflammatory reaction due to other internal orthopedic prosthetic devices, implants and grafts, subsequent encounter: Secondary | ICD-10-CM

## 2024-05-07 DIAGNOSIS — M4620 Osteomyelitis of vertebra, site unspecified: Secondary | ICD-10-CM

## 2024-05-07 DIAGNOSIS — I959 Hypotension, unspecified: Secondary | ICD-10-CM | POA: Diagnosis not present

## 2024-05-07 DIAGNOSIS — L97529 Non-pressure chronic ulcer of other part of left foot with unspecified severity: Secondary | ICD-10-CM

## 2024-05-07 NOTE — Patient Instructions (Signed)
 Please follow up with podiatry and vascular surgery for ongoing foot ulcer care and evaluation/optimization of vascular blood supply to the foot    Your back infection remains there I think, but well suppressed. Given how sick you were and that this staph aureus bacteria is nasty, we'll keep antibiotics   Let me know 2 weeks before you run out to refill the antibiotic cefadroxil  500 mg twice a day   Labs today   See me in 6 months for routine follow up

## 2024-05-07 NOTE — Progress Notes (Signed)
 Patient ID: Jim Ramirez, male   DOB: 1951/05/26, 73 y.o.   MRN: [id number]  HPI 73yo M with admission 11/2022 for disseminated mssa infection including ventriculitis, lumbar spine om, epidural Abscess, right hand abscess and 2nd digit om, negative TEE, S/P on 01/20/23 left L3-4, L4-5 and L5-S1 laminotomy/foraminotomies by dr Mavis. Completed iv nafcillin  --> Ancef  on 02/18/2023 and was transitioned to p.o.cefadroxil  1000 mg bid. There was ID plan to do at least 10 months cefadroxil . Of note, no lumbar hardware was placed during lumbar spine surgery but he does have old cspine hardware that wasn't thought to be involved with the mssa infection  He las saw dr Luiz 8/14. At that time reported that snf wound care physician placed new picc on 8/14 and give him vanc/cefepime /metronidazole  for deep wound infection.   His blood pressure at the clinic 80s/50s so advised ivf at nursing home   04/01/23 id clinic fu He is f/u from snf with me today I reviewed mar No labs from snf He continues on vanc/cefepime . He also continues on cefadroxil  at 500 mg bid  He is also given lasix  for bilateral LE edema  Wound care has been taking care of left foot/right mid thoracic wound.   He is feeling well no n/v/diarrhea. His left foot is in mild pain   11/27/23 id clinic f/u See a&p for detail Previously on wheel chair, now walking with fww   05/07/24 id clinic f/u Patient is here for f/u mssa lumbar spine om in setting old hardware He remains on cefadroxil  suppressive therapy; 500 mg twice a day  He comes in by himself with fww looking much better (weight and energy) He does walk with a cane  Pain in the back much better-- 4/10 at worse  He is basically at baseline. No side effect from abx   He has an open wound in the left foot; he does have diabetes mellitus as well Doesn't smoke (gave up 40 years ago)  He has numbness from posterior hip to the foot; but the left foot has been numb  longer than that   No f/c   Outpatient Encounter Medications as of 05/07/2024  Medication Sig   acetaminophen  (TYLENOL ) 325 MG tablet Take 2 tablets (650 mg total) by mouth every 6 (six) hours as needed for mild pain (pain score 1-3) (or Fever >/= 101).   albuterol  (VENTOLIN  HFA) 108 (90 Base) MCG/ACT inhaler Inhale 2 puffs into the lungs every 6 (six) hours as needed for wheezing or shortness of breath.   apixaban  (ELIQUIS ) 5 MG TABS tablet Take 1 tablet (5 mg total) by mouth 2 (two) times daily.   ascorbic acid  (VITAMIN C ) 500 MG tablet Take 500 mg by mouth daily.   Azelastine  HCl 137 MCG/SPRAY SOLN Place 2 sprays into both nostrils 2 (two) times daily.   BD VEO INSULIN  SYRINGE U/F 31G X 15/64 0.5 ML MISC SMARTSIG:1 Unit(s) 3 Times Daily   betamethasone dipropionate 0.05 % lotion Apply 1 Application topically 2 (two) times daily as needed (irritation).   cefadroxil  (DURICEF) 500 MG capsule Take 1 capsule (500 mg total) by mouth 2 (two) times daily.   clotrimazole-betamethasone (LOTRISONE) cream Apply 1 Application topically 2 (two) times daily as needed (irritation). Apply to sacrum and groin topically for wound care   cyanocobalamin  (VITAMIN B12) 1000 MCG/ML injection Inject 1,000 mcg into the muscle every 30 (thirty) days.   diphenhydrAMINE  (BENADRYL ) 25 MG tablet Take 25 mg by mouth every 6 (  six) hours as needed for allergies.   escitalopram  (LEXAPRO ) 20 MG tablet Take 20 mg by mouth daily.   esomeprazole (NEXIUM) 40 MG capsule Take 40 mg by mouth daily.   ferrous sulfate  325 (65 FE) MG tablet Take 325 mg by mouth 2 (two) times daily.   hydrochlorothiazide  (HYDRODIURIL ) 25 MG tablet Take 25 mg by mouth daily.   JARDIANCE  25 MG TABS tablet Take 25 mg by mouth daily.   Lancets (ONETOUCH DELICA PLUS LANCET33G) MISC SMARTSIG:3 Topical Daily   levocetirizine (XYZAL ) 5 MG tablet Take 5 mg by mouth daily.   metFORMIN  (GLUCOPHAGE ) 1000 MG tablet Take 1,000 mg by mouth 2 (two) times daily.    metoprolol  succinate (TOPROL -XL) 50 MG 24 hr tablet Take 50 mg by mouth daily.   mirabegron  ER (MYRBETRIQ ) 50 MG TB24 tablet Take 1 tablet (50 mg total) by mouth daily.   mometasone  (ELOCON ) 0.1 % ointment Apply 1 application  topically 2 (two) times daily as needed (irritation).   NOVOLIN 70/30 (70-30) 100 UNIT/ML injection Inject 12-75 Units into the skin 2 (two) times daily as needed (High Blood Sugar). Max dose 100 units daily   ONETOUCH ULTRA test strip 3 (three) times daily.   oxyCODONE  (OXY IR/ROXICODONE ) 5 MG immediate release tablet Take 1 tablet (5 mg total) by mouth every 6 (six) hours as needed for moderate pain (pain score 4-6) or severe pain (pain score 7-10).   rosuvastatin  (CRESTOR ) 20 MG tablet Take 20 mg by mouth daily.   tamsulosin  (FLOMAX ) 0.4 MG CAPS capsule Take 0.4 mg by mouth daily.   traMADol  (ULTRAM ) 50 MG tablet Take 1 tablet (50 mg total) by mouth every 6 (six) hours as needed.   trandolapril  (MAVIK ) 2 MG tablet Take 2 mg by mouth daily.   zolpidem  (AMBIEN ) 10 MG tablet Take 10 mg by mouth at bedtime as needed for sleep.   No facility-administered encounter medications on file as of 05/07/2024.     Patient Active Problem List   Diagnosis Date Noted   Deep venous thrombosis (HCC) 02/03/2024   Lumbar spondylosis 02/03/2024   Prostate cancer (HCC) 02/03/2024   Diabetic ulcer of left great toe (HCC) 07/16/2023   Chronic low back pain 07/08/2023   Spinal stenosis of lumbar region 07/08/2023   Idiopathic acute pancreatitis without infection or necrosis 05/24/2023   Nausea without vomiting 05/24/2023   Hypotension 05/22/2023   Pressure injury of heel, stage 3 (HCC) 05/22/2023   Skin ulcer of left heel, limited to breakdown of skin (HCC) 05/01/2023   Pressure injury of right thoracic region of back, stage 2 (HCC) 02/24/2023   Hypokalemia 02/22/2023   Paroxysmal atrial fibrillation with RVR (HCC) 02/22/2023   Malnutrition of moderate degree 01/14/2023   MSSA  bacteremia 12/26/2022   New onset atrial fibrillation (HCC) 12/24/2022   Discitis of lumbar region 12/24/2022   BPH (benign prostatic hyperplasia) 12/24/2022   Mood disorder 12/24/2022   Cervical spondylosis with myelopathy and radiculopathy 05/22/2022   Diabetes mellitus type 2 in nonobese [facility]) 02/26/2008   Dyslipidemia 02/26/2008   Essential hypertension 02/26/2008   Internal hemorrhoids 02/26/2008   External hemorrhoids 02/26/2008   Duodenal ulcer without hemorrhage or perforation and without obstruction 02/26/2008   MELENA 02/26/2008   NECK PAIN 02/26/2008   INSOMNIA 02/26/2008   Sleep apnea 02/26/2008   Abdominal pain 02/26/2008   RECTAL BLEEDING, HX OF 02/26/2008     Health Maintenance Due  Topic Date Due   Medicare Annual Wellness (AWV)  Never  done   COVID-19 Vaccine (1) Never done   FOOT EXAM  Never done   Diabetic kidney evaluation - Urine ACR  Never done   Pneumococcal Vaccine: 50+ Years (1 of 2 - PCV) Never done   Zoster Vaccines- Shingrix (1 of 2) Never done   DTaP/Tdap/Td (1 - Tdap) 03/31/1997   Influenza Vaccine  02/27/2024     Review of Systems + wound to thoracic wall. 12 point ros is otherwise negative Physical Exam   BP 126/73   Pulse 64   Temp 97.6 F (36.4 C) (Oral)   Wt 211 lb (95.7 kg)   SpO2 96%   BMI 28.62 kg/m   Gen = a x o by 3 in NAD HEENT = moist mucous membranes Skin -- no rash Neuro -- nonfocal; walks with fww Cv -- rr no mrg Msk -- no back tenderness  CBC Lab Results  Component Value Date   WBC 8.5 12/26/2023   RBC 3.89 (L) 12/26/2023   HGB 11.6 (L) 12/26/2023   HCT 35.1 (L) 12/26/2023   PLT 115 (L) 12/26/2023   MCV 90.2 12/26/2023   MCH 29.8 12/26/2023   MCHC 33.0 12/26/2023   RDW 14.1 12/26/2023   LYMPHSABS 1.7 05/23/2023   MONOABS 0.4 05/23/2023   EOSABS 0.0 05/23/2023    BMET Lab Results  Component Value Date   NA 136 12/26/2023   K 3.6 12/26/2023   CL 104 12/26/2023   CO2 27 12/26/2023   GLUCOSE 147 (H)  12/26/2023   BUN 15 12/26/2023   CREATININE 0.66 12/26/2023   CALCIUM  8.5 (L) 12/26/2023   GFRNONAA >60 12/26/2023   GFRAA  05/09/2010    >60        The eGFR has been calculated using the MDRD equation. This calculation has not been validated in all clinical situations. eGFR's persistently <60 mL/min signify possible Chronic Kidney Disease.      Assessment and Plan Hypotension = currently asymptomatic but concern he is below his baseline. Gave verbal order to SNF  for ivf - 2L NS over 2-3 hrs  plus initiation of abtx.He is to go to ED for evaluation if SbP is 100 or <100. Verbal order to provider at SNF. Recommend to be evaluated by NP at SNF in the morning.  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 Has wound care physician provision.   ---------- 04/01/23 id clinic assessment Patient's bp remains relatively low and he is not symptomatic Wound team at snf had started vanc/cefepime  8/14 and he continues on that. He remains on cefadroxil  which I'll hold for now while on vanc/cefepime   I do not think with the back wound looking like it is he needs further vanc/cefepime  but will defer the the wound care physician who is managing that along with opat labs. They are managing wounds as well which today 04/01/23 the back wound looks great  When he is done with the vanc/cefepime  he can restart cefadroxil  1000 mg po bid again in accordance with prior ID plan. He does have old cspine hardware which at time of mssa infection 11/2022 was (corrected to was from wasn't) thought to be involved    ID f/u in 2-3 weeks   04/25/23 id clinic assessment No complaint today Getting wound care for the back wound   He has chronic wound bilateral feet that haven't been  infected and didn't examine today  Patient is back on cefadroxil  1000 mg po bid starting 9/13;  iv abx stopped; picc not present Snf labs provided today: 04/24/23 cbc 5.8/10/189; prealbumin 7 04/05/23 cr 0.8  Encourage supplemental boost three times a day with meals to improve nutrition Continue wound care   F/u dr Luiz in 8-10 weeks for routine care/abx monitoring   11/27/23 id clinic assessment Patient just left nursing home  He is taking cefadroxil  1000 mg bid still No back pain No f/c No n/v Walking with fww (before this happened was walking/running 15 miles a day)  Dx -- lumbar spine mssa hardware associated infection  Continues cefadroxil  500 mg bid Labs today  F/u 3 months given decrease cefadroxil  dose  Chart copied to dr Eligio Fairly PCP    ------------ 05/07/24 id clinic assessment  From a lower back/hardware infection standpoit it appears patient is currently well suppressed We discussed that will keep antibiotics indefinitely Labs today for kidney/liver side effect monitoring  He reports left diabetic foot ulcer along with what appears to be either sciatica or lumbar spine spondylopathy. He also has diabetic foot neuropathy  -continue to followup with podiatry/vascular surgery for evaluation/management   See me in another 6 months

## 2024-05-08 LAB — COMPLETE METABOLIC PANEL WITHOUT GFR
AG Ratio: 1.7 (calc) (ref 1.0–2.5)
ALT: 13 U/L (ref 9–46)
AST: 13 U/L (ref 10–35)
Albumin: 4 g/dL (ref 3.6–5.1)
Alkaline phosphatase (APISO): 76 U/L (ref 35–144)
BUN: 14 mg/dL (ref 7–25)
CO2: 26 mmol/L (ref 20–32)
Calcium: 9.2 mg/dL (ref 8.6–10.3)
Chloride: 101 mmol/L (ref 98–110)
Creat: 0.84 mg/dL (ref 0.70–1.28)
Globulin: 2.4 g/dL (ref 1.9–3.7)
Glucose, Bld: 149 mg/dL — ABNORMAL HIGH (ref 65–99)
Potassium: 3.6 mmol/L (ref 3.5–5.3)
Sodium: 137 mmol/L (ref 135–146)
Total Bilirubin: 0.6 mg/dL (ref 0.2–1.2)
Total Protein: 6.4 g/dL (ref 6.1–8.1)

## 2024-05-08 LAB — CBC
HCT: 43.4 % (ref 38.5–50.0)
Hemoglobin: 14.3 g/dL (ref 13.2–17.1)
MCH: 28.9 pg (ref 27.0–33.0)
MCHC: 32.9 g/dL (ref 32.0–36.0)
MCV: 87.7 fL (ref 80.0–100.0)
MPV: 11.3 fL (ref 7.5–12.5)
Platelets: 62 Thousand/uL — ABNORMAL LOW (ref 140–400)
RBC: 4.95 Million/uL (ref 4.20–5.80)
RDW: 12.9 % (ref 11.0–15.0)
WBC: 9.1 Thousand/uL (ref 3.8–10.8)

## 2024-05-08 LAB — C-REACTIVE PROTEIN: CRP: 5.4 mg/L (ref <8.0)

## 2024-05-08 LAB — SEDIMENTATION RATE: Sed Rate: 9 mm/h (ref 0–20)

## 2024-05-10 ENCOUNTER — Ambulatory Visit: Payer: Self-pay | Admitting: Internal Medicine

## 2024-05-10 DIAGNOSIS — T847XXD Infection and inflammatory reaction due to other internal orthopedic prosthetic devices, implants and grafts, subsequent encounter: Secondary | ICD-10-CM

## 2024-05-11 NOTE — Telephone Encounter (Signed)
 Spoke with patient regarding results. Scheduled for lab appt on 1/5 at 2. No questions at this time. Lorenda CHRISTELLA Code, RMA

## 2024-05-11 NOTE — Telephone Encounter (Signed)
-----   Message from Trainer sent at 05/10/2024  4:50 PM EDT ----- Hi team   His platelet been all over but down trending the past year   Could we get him in in around 2-3 months for another recheck  thanks ----- Message ----- From: Interface, [facility] Lab Results In Sent: 05/07/2024  11:09 PM EDT To: Constance ONEIDA Passer, MD

## 2024-05-14 ENCOUNTER — Other Ambulatory Visit: Payer: Self-pay | Admitting: Urology

## 2024-05-14 DIAGNOSIS — R339 Retention of urine, unspecified: Secondary | ICD-10-CM

## 2024-05-17 DIAGNOSIS — H34812 Central retinal vein occlusion, left eye, with macular edema: Secondary | ICD-10-CM | POA: Diagnosis not present

## 2024-05-18 ENCOUNTER — Encounter: Payer: Self-pay | Admitting: Vascular Surgery

## 2024-05-18 ENCOUNTER — Ambulatory Visit: Admitting: Vascular Surgery

## 2024-05-18 VITALS — BP 147/52 | HR 65 | Ht 72.0 in | Wt 211.0 lb

## 2024-05-18 DIAGNOSIS — Z0000 Encounter for general adult medical examination without abnormal findings: Secondary | ICD-10-CM | POA: Diagnosis not present

## 2024-05-18 NOTE — Progress Notes (Signed)
 [facility] [place]  ASSESSMENT / PLAN: Jim Ramirez is a 73 y.o. male normal peripheral vascular physical exam and reassuring noninvasive testing.  Patient encouraged to ambulate is much as his neurologic symptoms will allow.  He can follow-up with me on an as-needed basis.  CHIEF COMPLAINT: Concern for peripheral arterial disease  HISTORY OF PRESENT ILLNESS: Jim Ramirez is a 73 y.o. male referred to clinic for evaluation of possible peripheral arterial disease.  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  He developed a decubitus left heel ulcer during a prolonged hospital stay for the same.  This is healed with wound care.  He was previously told he would not walk very much, but has been able to ambulate with a rollator.  He does not walk fast or far enough to claudicate.  He does not describe rest pain.  He has no new ulcers about his feet.  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   CTA negative for aneurysm   Atrial fibrillation (HCC)    Complication of anesthesia    spinal headaches   Depressed    Diabetes mellitus without complication (HCC)    DVT (deep venous thrombosis) (HCC)    History of kidney stones    Hypertension    Nephrolithiasis    Sleep apnea     Past Surgical History:  Procedure Laterality Date   ANTERIOR CERVICAL DECOMP/DISCECTOMY FUSION N/A 05/22/2022   Procedure: ANTERIOR CERVICAL DISCECTOMY FUSION, INTERBODY PROSTHESIS,PLATE/SCREWS CERVICAL THREE-FOUR, CERVICAL FOUR-FIVE;REMOVAL CERVICAL PLATE;  Surgeon: Mavis Purchase, MD;  Location: [facility];  Service: Neurosurgery;  Laterality: N/A;  3C   CATARACT EXTRACTION W/PHACO Left 09/23/2022   Procedure: CATARACT EXTRACTION PHACO AND INTRAOCULAR LENS PLACEMENT (IOC);  Surgeon: Harrie Agent, MD;  Location: [facility] ORS;  Service: Ophthalmology;  Laterality:  Left;  CDE: 7.52   CATARACT EXTRACTION W/PHACO Right 10/07/2022   Procedure: CATARACT EXTRACTION PHACO AND INTRAOCULAR LENS PLACEMENT (IOC);  Surgeon: Harrie Agent, MD;  Location: [facility];  Service: Ophthalmology;  Laterality: Right;  CDE: 9.40   IR IVC FILTER PLMT / S&I /IMG GUID/MOD SED  01/19/2023   LUMBAR LAMINECTOMY/DECOMPRESSION MICRODISCECTOMY N/A 01/20/2023   Procedure: LUMBAR LAM FOR EPIDURAL ABSCESS, LUMBAR THREE-FOUR, LUMBAR FOUR-FIVE,  LUMBAR FIVE-SACRAL ONE;  Surgeon: Mavis Purchase, MD;  Location: [facility] OR;  Service: Neurosurgery;  Laterality: N/A;   STOMACH SURGERY     TEE WITHOUT CARDIOVERSION N/A 12/31/2022   Procedure: TRANSESOPHAGEAL ECHOCARDIOGRAM;  Surgeon: Kate Lonni CROME, MD;  Location: [facility] INVASIVE CV LAB;  Service: Cardiovascular;  Laterality: N/A;   TONSILLECTOMY  2000   TRANSURETHRAL RESECTION OF PROSTATE N/A 12/25/2023   Procedure: TURP (TRANSURETHRAL RESECTION OF PROSTATE);  Surgeon: Sherrilee Belvie CROME, MD;  Location: [facility];  Service: Urology;  Laterality: N/A;    Family History  Problem Relation Age of Onset   Heart attack Mother    Heart attack Father    Hypertension Brother    Diabetes Brother    Coronary artery disease Brother     Social History   Socioeconomic History   Marital status: Significant Other    Spouse name: Not on file   Number of children: 2   Years of education: Not on file   Highest education level: Not on file  Occupational History   Not on file  Tobacco Use   Smoking status: Former    Current packs/day: 0.00  Types: Cigarettes    Quit date: 64    Years since quitting: 31.8    Passive exposure: Never   Smokeless tobacco: Never  Vaping Use   Vaping status: Never Used  Substance and Sexual Activity   Alcohol  use: Not Currently   Drug use: Never   Sexual activity: Yes  Other Topics Concern   Not on file  Social History Narrative   Not on file   Social Drivers of Health   Financial Resource Strain: Low Risk   (07/03/2023)   Received from [facility]   Overall Financial Resource Strain (CARDIA)    Difficulty of Paying Living Expenses: Not hard at all  Food Insecurity: No Food Insecurity (12/25/2023)   Hunger Vital Sign    Worried About Running Out of Food in the Last Year: Never true    Ran Out of Food in the Last Year: Never true  Transportation Needs: No Transportation Needs (12/25/2023)   PRAPARE - Administrator, Civil Service (Medical): No    Lack of Transportation (Non-Medical): No  Physical Activity: Inactive (07/03/2023)   Received from [facility]   Exercise Vital Sign    On average, how many days per week do you engage in moderate to strenuous exercise (like a brisk walk)?: 0 days    On average, how many minutes do you engage in exercise at this level?: 0 min  Stress: No Stress Concern Present (07/03/2023)   Received from [facility] - Occupational Stress Questionnaire    Feeling of Stress : Not at all  Social Connections: Moderately Integrated (12/25/2023)   Social Connection and Isolation Panel    Frequency of Communication with Friends and Family: Three times a week    Frequency of Social Gatherings with Friends and Family: Three times a week    Attends Religious Services: 1 to 4 times per year    Active Member of Clubs or Organizations: Yes    Attends Banker Meetings: 1 to 4 times per year    Marital Status: Widowed  Intimate Partner Violence: Not At Risk (12/25/2023)   Humiliation, Afraid, Rape, and Kick questionnaire    Fear of Current or Ex-Partner: No    Emotionally Abused: No    Physically Abused: No    Sexually Abused: No    No Known Allergies  Current Outpatient Medications  Medication Sig Dispense Refill   acetaminophen  (TYLENOL ) 325 MG tablet Take 2 tablets (650 mg total) by mouth every 6 (six) hours as needed for mild pain (pain score 1-3) (or Fever >/= 101).     albuterol  (VENTOLIN   HFA) 108 (90 Base) MCG/ACT inhaler Inhale 2 puffs into the lungs every 6 (six) hours as needed for wheezing or shortness of breath. 18 g 2   apixaban  (ELIQUIS ) 5 MG TABS tablet Take 1 tablet (5 mg total) by mouth 2 (two) times daily. 60 tablet 11   ascorbic acid  (VITAMIN C ) 500 MG tablet Take 500 mg by mouth daily.     Azelastine  HCl 137 MCG/SPRAY SOLN Place 2 sprays into both nostrils 2 (two) times daily.     BD VEO INSULIN  SYRINGE U/F 31G X 15/64 0.5 ML MISC SMARTSIG:1 Unit(s) 3 Times Daily     betamethasone dipropionate 0.05 % lotion Apply 1 Application topically 2 (two) times daily as needed (irritation).     cefadroxil  (DURICEF) 500 MG capsule Take 1 capsule (500 mg total) by mouth 2 (  two) times daily. 180 capsule 3   clotrimazole-betamethasone (LOTRISONE) cream Apply 1 Application topically 2 (two) times daily as needed (irritation). Apply to sacrum and groin topically for wound care     cyanocobalamin  (VITAMIN B12) 1000 MCG/ML injection Inject 1,000 mcg into the muscle every 30 (thirty) days.     diphenhydrAMINE  (BENADRYL ) 25 MG tablet Take 25 mg by mouth every 6 (six) hours as needed for allergies.     escitalopram  (LEXAPRO ) 20 MG tablet Take 20 mg by mouth daily.     esomeprazole (NEXIUM) 40 MG capsule Take 40 mg by mouth daily.     ferrous sulfate  325 (65 FE) MG tablet Take 325 mg by mouth 2 (two) times daily.     hydrochlorothiazide  (HYDRODIURIL ) 25 MG tablet Take 25 mg by mouth daily.     JARDIANCE  25 MG TABS tablet Take 25 mg by mouth daily.     Lancets (ONETOUCH DELICA PLUS LANCET33G) MISC SMARTSIG:3 Topical Daily     levocetirizine (XYZAL ) 5 MG tablet Take 5 mg by mouth daily.     metFORMIN  (GLUCOPHAGE ) 1000 MG tablet Take 1,000 mg by mouth 2 (two) times daily.     metoprolol  succinate (TOPROL -XL) 50 MG 24 hr tablet Take 50 mg by mouth daily.     mirabegron  ER (MYRBETRIQ ) 50 MG TB24 tablet Take 1 tablet (50 mg total) by mouth daily. 30 tablet 1   mometasone  (ELOCON ) 0.1 % ointment  Apply 1 application  topically 2 (two) times daily as needed (irritation).     NOVOLIN 70/30 (70-30) 100 UNIT/ML injection Inject 12-75 Units into the skin 2 (two) times daily as needed (High Blood Sugar). Max dose 100 units daily     ONETOUCH ULTRA test strip 3 (three) times daily.     oxyCODONE  (OXY IR/ROXICODONE ) 5 MG immediate release tablet Take 1 tablet (5 mg total) by mouth every 6 (six) hours as needed for moderate pain (pain score 4-6) or severe pain (pain score 7-10). 12 tablet 0   rosuvastatin  (CRESTOR ) 20 MG tablet Take 20 mg by mouth daily.     tamsulosin  (FLOMAX ) 0.4 MG CAPS capsule Take 0.4 mg by mouth daily.     traMADol  (ULTRAM ) 50 MG tablet Take 1 tablet (50 mg total) by mouth every 6 (six) hours as needed. 15 tablet 0   trandolapril  (MAVIK ) 2 MG tablet Take 2 mg by mouth daily.     zolpidem  (AMBIEN ) 10 MG tablet Take 10 mg by mouth at bedtime as needed for sleep.     No current facility-administered medications for this visit.    PHYSICAL EXAM Vitals:   05/18/24 1320  BP: (!) 147/52  Pulse: 65  Weight: 211 lb (95.7 kg)  Height: 6' (1.829 m)   No acute distress Regular rate and rhythm Unlabored breathing 2+ dorsalis pedis pulses bilaterally  PERTINENT LABORATORY AND RADIOLOGIC DATA  Most recent CBC    Latest Ref Rng & Units 05/07/2024   11:39 AM 12/26/2023    4:03 AM 12/25/2023   11:21 AM  CBC  WBC 3.8 - 10.8 Thousand/uL 9.1  8.5  8.8   Hemoglobin 13.2 - 17.1 g/dL 85.6  88.3  88.0   Hematocrit 38.5 - 50.0 % 43.4  35.1  36.0   Platelets 140 - 400 Thousand/uL 62  115  125      Most recent CMP    Latest Ref Rng & Units 05/07/2024   11:39 AM 12/26/2023    4:03 AM 12/25/2023   11:21  AM  CMP  Glucose 65 - 99 mg/dL 850  852  822   BUN 7 - 25 mg/dL 14  15  19    Creatinine 0.70 - 1.28 mg/dL 9.15  9.33  9.25   Sodium 135 - 146 mmol/L 137  136  137   Potassium 3.5 - 5.3 mmol/L 3.6  3.6  3.7   Chloride 98 - 110 mmol/L 101  104  103   CO2 20 - 32 mmol/L 26  27   24    Calcium  8.6 - 10.3 mg/dL 9.2  8.5  8.7   Total Protein 6.1 - 8.1 g/dL 6.4     Total Bilirubin 0.2 - 1.2 mg/dL 0.6     AST 10 - 35 U/L 13     ALT 9 - 46 U/L 13       Renal function Estimated Creatinine Clearance: 96.7 mL/min (by C-G formula based on SCr of 0.84 mg/dL).  Hgb A1c MFr Bld (%)  Date Value  12/25/2023 8.8 (H)    LDL Cholesterol  Date Value Ref Range Status  05/23/2023 72 0 - 99 mg/dL Final    Comment:           Total Cholesterol/HDL:CHD Risk Coronary Heart Disease Risk Table                     Men   Women  1/2 Average Risk   3.4   3.3  Average Risk       5.0   4.4  2 X Average Risk   9.6   7.1  3 X Average Risk  23.4   11.0        Use the calculated Patient Ratio above and the CHD Risk Table to determine the patient's CHD Risk.        ATP III CLASSIFICATION (LDL):  <100     mg/dL   Optimal  899-870  mg/dL   Near or Above                    Optimal  130-159  mg/dL   Borderline  839-810  mg/dL   High  >809     mg/dL   Very High Performed at [facility], [address], [place], [place] [address]     CLINICAL DATA:  Peripheral vascular disease   EXAM: NONINVASIVE PHYSIOLOGIC VASCULAR STUDY OF BILATERAL LOWER EXTREMITIES   TECHNIQUE: Non-invasive vascular evaluation of both lower extremities was performed at rest, including calculation of ankle-brachial indices, multiple segmental pressure evaluation, segmental Doppler and segmental pulse volume recording.   COMPARISON:  None Available.   FINDINGS: Right Lower Extremity   Resting ABI:  1.1   Resting TBI: 0.9   Segmental Pressures: The proximal thigh, calf, and ankle pressures are noncompressible. Great toe pressure: 117   Arterial Waveforms: Multiphasic   PVRs: Normal PVRs with maintained waveform amplitude, augmentation and quality.   Left Lower Extremity:   Resting ABI: 1.4   Resting TBI: 0.6   Segmental Pressures: The distal left thigh, calf, and posterior tibial position  are noncompressible. Great toe pressure: 78   Arterial Waveforms: The posterior tibial waveform is degraded.   PVRs: Normal PVRs with maintained waveform amplitude, augmentation and quality.   Other: Similar upper extremity pressures.   Ankle Brachial index   > 1.4 Non diagnostic secondary to incompressible vessel calcifications   1.0-1.4       Normal   0.9-0.99     Borderline PAD  0.8-0.89     Mild PAD   0.5-0.79     Moderate PAD   < 0.5          Severe PAD   Toe Brachial Index   Normal     >0.65   Moderate  0.53-0.64   Severe     <0.23   Toe Pressures   Absolute toe pressure >28mmHg sufficient for wound healing.   Toe pressures <41mmHg = critical limb ischemia.   IMPRESSION: 1. Right lower extremity: Right ABI is within normal limits. Suspected calcific atherosclerosis without convincing evidence of flow-limiting stenosis. 2. Left lower extremity: Left ABI is within normal limits. Suspected calcific atherosclerosis without evidence of flow-limiting stenosis in the femoral and popliteal segments. Left toe pressure is diminished which suggests intra pedal microvascular disease.     Electronically Signed   By: Maude Naegeli M.D.   On: 04/13/2024 12:10  Debby SAILOR. Magda, MD FACS [facility] Phone Number: [phone] 05/18/2024 7:43 AM   Total time spent on preparing this encounter including chart review, data review, collecting history, examining the patient, and coordinating care: 45 minutes  Portions of this report may have been transcribed using voice recognition software.  Every effort has been made to ensure accuracy; however, inadvertent computerized transcription errors may still be present.

## 2024-05-24 DIAGNOSIS — J020 Streptococcal pharyngitis: Secondary | ICD-10-CM | POA: Diagnosis not present

## 2024-05-24 DIAGNOSIS — Z13220 Encounter for screening for lipoid disorders: Secondary | ICD-10-CM | POA: Diagnosis not present

## 2024-05-24 DIAGNOSIS — U071 COVID-19: Secondary | ICD-10-CM | POA: Diagnosis not present

## 2024-05-24 DIAGNOSIS — Z20822 Contact with and (suspected) exposure to covid-19: Secondary | ICD-10-CM | POA: Diagnosis not present

## 2024-05-24 DIAGNOSIS — E785 Hyperlipidemia, unspecified: Secondary | ICD-10-CM | POA: Diagnosis not present

## 2024-05-25 DIAGNOSIS — Z7901 Long term (current) use of anticoagulants: Secondary | ICD-10-CM | POA: Diagnosis not present

## 2024-05-25 DIAGNOSIS — R040 Epistaxis: Secondary | ICD-10-CM | POA: Diagnosis not present

## 2024-06-01 DIAGNOSIS — M5442 Lumbago with sciatica, left side: Secondary | ICD-10-CM | POA: Diagnosis not present

## 2024-06-01 DIAGNOSIS — R200 Anesthesia of skin: Secondary | ICD-10-CM | POA: Diagnosis not present

## 2024-06-01 DIAGNOSIS — M5432 Sciatica, left side: Secondary | ICD-10-CM | POA: Diagnosis not present

## 2024-06-08 ENCOUNTER — Other Ambulatory Visit: Payer: Self-pay | Admitting: Neurosurgery

## 2024-06-08 DIAGNOSIS — M5432 Sciatica, left side: Secondary | ICD-10-CM

## 2024-06-09 ENCOUNTER — Encounter: Payer: Self-pay | Admitting: Neurosurgery

## 2024-06-15 DIAGNOSIS — H34812 Central retinal vein occlusion, left eye, with macular edema: Secondary | ICD-10-CM | POA: Diagnosis not present

## 2024-06-15 DIAGNOSIS — Z961 Presence of intraocular lens: Secondary | ICD-10-CM | POA: Diagnosis not present

## 2024-06-15 DIAGNOSIS — H31092 Other chorioretinal scars, left eye: Secondary | ICD-10-CM | POA: Diagnosis not present

## 2024-06-15 DIAGNOSIS — H43821 Vitreomacular adhesion, right eye: Secondary | ICD-10-CM | POA: Diagnosis not present

## 2024-06-15 DIAGNOSIS — H35372 Puckering of macula, left eye: Secondary | ICD-10-CM | POA: Diagnosis not present

## 2024-06-15 DIAGNOSIS — E113313 Type 2 diabetes mellitus with moderate nonproliferative diabetic retinopathy with macular edema, bilateral: Secondary | ICD-10-CM | POA: Diagnosis not present

## 2024-06-15 DIAGNOSIS — H43812 Vitreous degeneration, left eye: Secondary | ICD-10-CM | POA: Diagnosis not present

## 2024-06-15 DIAGNOSIS — H35033 Hypertensive retinopathy, bilateral: Secondary | ICD-10-CM | POA: Diagnosis not present

## 2024-06-27 ENCOUNTER — Ambulatory Visit
Admission: RE | Admit: 2024-06-27 | Discharge: 2024-06-27 | Disposition: A | Source: Ambulatory Visit | Attending: Neurosurgery | Admitting: Neurosurgery

## 2024-06-27 DIAGNOSIS — M48061 Spinal stenosis, lumbar region without neurogenic claudication: Secondary | ICD-10-CM | POA: Diagnosis not present

## 2024-06-27 DIAGNOSIS — M5432 Sciatica, left side: Secondary | ICD-10-CM

## 2024-07-13 DIAGNOSIS — R200 Anesthesia of skin: Secondary | ICD-10-CM | POA: Diagnosis not present

## 2024-07-16 ENCOUNTER — Telehealth: Payer: Self-pay

## 2024-07-16 DIAGNOSIS — R040 Epistaxis: Secondary | ICD-10-CM

## 2024-07-16 NOTE — Progress Notes (Signed)
 Complex Care Management Note  Care Guide Note 07/16/2024 Name: Jim Ramirez MRN: [id number] DOB: [birth year]  Jim Ramirez is a 73 y.o. year old male who sees Maree Isles, MD for primary care. I reached out to Elsie KANDICE Pepper by phone today to offer complex care management services.  [name] was given information about Complex Care Management services today including:   The Complex Care Management services include support from the care team which includes your Nurse Care Manager, Clinical Social Worker, or Pharmacist.  The Complex Care Management team is here to help remove barriers to the health concerns and goals most important to you. Complex Care Management services are voluntary, and the patient may decline or stop services at any time by request to their care team member.   Complex Care Management Consent Status: Patient did not agree to participate in complex care management services at this time.  Encounter Outcome:  Patient Refused  Dreama Agent [facility], [facility] VBCI Assistant Direct Dial: [phone]  Fax: [phone]

## 2024-07-16 NOTE — Progress Notes (Signed)
 Complex Care Management Note Care Guide Note  07/16/2024 Name: Jim Ramirez MRN: [id number] DOB: [birth year]   Complex Care Management Outreach Attempts: An unsuccessful telephone outreach was attempted today to offer the patient information about available complex care management services.  Follow Up Plan:  Additional outreach attempts will be made to offer the patient complex care management information and services.   Encounter Outcome:  No Answer  [name]  [facility], [facility] VBCI Assistant Direct Dial: [phone]  Fax: [phone]

## 2024-07-20 DIAGNOSIS — H34812 Central retinal vein occlusion, left eye, with macular edema: Secondary | ICD-10-CM | POA: Diagnosis not present

## 2024-08-02 ENCOUNTER — Other Ambulatory Visit

## 2024-08-02 ENCOUNTER — Other Ambulatory Visit: Payer: Self-pay

## 2024-08-02 DIAGNOSIS — C61 Malignant neoplasm of prostate: Secondary | ICD-10-CM

## 2024-08-02 DIAGNOSIS — T847XXD Infection and inflammatory reaction due to other internal orthopedic prosthetic devices, implants and grafts, subsequent encounter: Secondary | ICD-10-CM

## 2024-08-03 LAB — CBC WITH DIFFERENTIAL/PLATELET
Absolute Lymphocytes: 3179 {cells}/uL (ref 850–3900)
Absolute Monocytes: 629 {cells}/uL (ref 200–950)
Basophils Absolute: 51 {cells}/uL (ref 0–200)
Basophils Relative: 0.6 %
Eosinophils Absolute: 264 {cells}/uL (ref 15–500)
Eosinophils Relative: 3.1 %
HCT: 45.9 % (ref 39.4–51.1)
Hemoglobin: 14.6 g/dL (ref 13.2–17.1)
MCH: 28.7 pg (ref 27.0–33.0)
MCHC: 31.8 g/dL (ref 31.6–35.4)
MCV: 90.2 fL (ref 81.4–101.7)
MPV: 11.4 fL (ref 7.5–12.5)
Monocytes Relative: 7.4 %
Neutro Abs: 4378 {cells}/uL (ref 1500–7800)
Neutrophils Relative %: 51.5 %
Platelets: 83 Thousand/uL — ABNORMAL LOW (ref 140–400)
RBC: 5.09 Million/uL (ref 4.20–5.80)
RDW: 13 % (ref 11.0–15.0)
Total Lymphocyte: 37.4 %
WBC: 8.5 Thousand/uL (ref 3.8–10.8)

## 2024-08-09 ENCOUNTER — Telehealth: Payer: Self-pay

## 2024-08-09 NOTE — Telephone Encounter (Signed)
 Who ordered it?  Can refer back to that provider  Plately is chronically low  Needs p p follow up for that

## 2024-08-09 NOTE — Telephone Encounter (Signed)
 Patient called to ask about his labs on 1/5.

## 2024-10-06 ENCOUNTER — Other Ambulatory Visit

## 2024-10-13 ENCOUNTER — Ambulatory Visit: Admitting: Urology

## 2024-11-04 ENCOUNTER — Ambulatory Visit: Admitting: Internal Medicine
